# Patient Record
Sex: Male | Born: 1940 | Race: White | Hispanic: No | Smoking: Never smoker
Health system: Southern US, Community
[De-identification: ages and names within clinical notes are randomized; demographics above are authoritative.]

## PROBLEM LIST (undated history)

## (undated) DIAGNOSIS — E119 Type 2 diabetes mellitus without complications: Secondary | ICD-10-CM

## (undated) DIAGNOSIS — N400 Enlarged prostate without lower urinary tract symptoms: Secondary | ICD-10-CM

## (undated) DIAGNOSIS — R351 Nocturia: Secondary | ICD-10-CM

## (undated) DIAGNOSIS — I509 Heart failure, unspecified: Secondary | ICD-10-CM

## (undated) DIAGNOSIS — R1310 Dysphagia, unspecified: Secondary | ICD-10-CM

## (undated) DIAGNOSIS — I10 Essential (primary) hypertension: Secondary | ICD-10-CM

## (undated) DIAGNOSIS — I499 Cardiac arrhythmia, unspecified: Secondary | ICD-10-CM

## (undated) DIAGNOSIS — R339 Retention of urine, unspecified: Secondary | ICD-10-CM

## (undated) DIAGNOSIS — E785 Hyperlipidemia, unspecified: Secondary | ICD-10-CM

## (undated) DIAGNOSIS — D649 Anemia, unspecified: Secondary | ICD-10-CM

## (undated) DIAGNOSIS — K219 Gastro-esophageal reflux disease without esophagitis: Secondary | ICD-10-CM

## (undated) HISTORY — DX: Hyperlipidemia, unspecified: E78.5

## (undated) HISTORY — DX: Benign prostatic hyperplasia without lower urinary tract symptoms: N40.0

## (undated) HISTORY — DX: Anemia, unspecified: D64.9

## (undated) HISTORY — DX: Retention of urine, unspecified: R33.9

## (undated) HISTORY — DX: Nocturia: R35.1

## (undated) HISTORY — DX: Cardiac arrhythmia, unspecified: I49.9

## (undated) HISTORY — DX: Type 2 diabetes mellitus without complications: E11.9

## (undated) HISTORY — DX: Essential (primary) hypertension: I10

## (undated) HISTORY — PX: TRANSURETHRAL RESECTION OF PROSTATE: SHX73

---

## 1962-02-20 HISTORY — PX: OTHER SURGICAL HISTORY: SHX169

## 2004-10-07 ENCOUNTER — Emergency Department: Payer: Self-pay | Admitting: Emergency Medicine

## 2004-10-07 ENCOUNTER — Other Ambulatory Visit: Payer: Self-pay

## 2004-11-04 ENCOUNTER — Other Ambulatory Visit: Payer: Self-pay

## 2004-11-05 ENCOUNTER — Inpatient Hospital Stay: Payer: Self-pay | Admitting: Internal Medicine

## 2005-05-19 ENCOUNTER — Ambulatory Visit: Payer: Self-pay | Admitting: Unknown Physician Specialty

## 2005-05-23 ENCOUNTER — Inpatient Hospital Stay: Payer: Self-pay | Admitting: Internal Medicine

## 2005-05-23 ENCOUNTER — Ambulatory Visit: Payer: Self-pay | Admitting: Internal Medicine

## 2005-05-24 ENCOUNTER — Other Ambulatory Visit: Payer: Self-pay

## 2005-07-03 ENCOUNTER — Ambulatory Visit: Payer: Self-pay | Admitting: Unknown Physician Specialty

## 2006-03-20 ENCOUNTER — Ambulatory Visit: Payer: Self-pay | Admitting: Internal Medicine

## 2007-01-05 ENCOUNTER — Inpatient Hospital Stay: Payer: Self-pay | Admitting: Specialist

## 2008-12-09 ENCOUNTER — Ambulatory Visit: Payer: Self-pay | Admitting: Internal Medicine

## 2008-12-21 ENCOUNTER — Ambulatory Visit: Payer: Self-pay | Admitting: Internal Medicine

## 2009-01-20 ENCOUNTER — Ambulatory Visit: Payer: Self-pay | Admitting: Internal Medicine

## 2009-02-20 ENCOUNTER — Ambulatory Visit: Payer: Self-pay | Admitting: Internal Medicine

## 2011-10-03 LAB — URINALYSIS, COMPLETE
Bacteria: NONE SEEN
Bilirubin,UR: NEGATIVE
Glucose,UR: NEGATIVE mg/dL (ref 0–75)
Ketone: NEGATIVE
Leukocyte Esterase: NEGATIVE
Nitrite: NEGATIVE
Ph: 8 (ref 4.5–8.0)
Protein: NEGATIVE
RBC,UR: 1 /HPF (ref 0–5)
Specific Gravity: 1.003 (ref 1.003–1.030)
Squamous Epithelial: <1
Transitional Epi: <1
WBC UR: 1 /HPF (ref 0–5)

## 2011-10-03 LAB — COMPREHENSIVE METABOLIC PANEL
Anion Gap: 8 (ref 7–16)
BUN: 12 mg/dL (ref 7–18)
Creatinine: 0.96 mg/dL (ref 0.60–1.30)
EGFR (African American): >60
EGFR (Non-African Amer.): >60
Glucose: 108 mg/dL — ABNORMAL HIGH (ref 65–99)
Osmolality: 285 (ref 275–301)
SGPT (ALT): 16 U/L (ref 12–78)
Total Protein: 6.5 g/dL (ref 6.4–8.2)

## 2011-10-03 LAB — CK TOTAL AND CKMB (NOT AT ARMC)
CK, Total: 38 U/L (ref 35–232)
CK-MB: 1.8 ng/mL (ref 0.5–3.6)

## 2011-10-03 LAB — PROTIME-INR
INR: 0.9
Prothrombin Time: 12.9 secs (ref 11.5–14.7)

## 2011-10-03 LAB — CBC
HCT: 39 % — ABNORMAL LOW (ref 40.0–52.0)
MCHC: 34.4 g/dL (ref 32.0–36.0)
MCV: 90 fL (ref 80–100)
RBC: 4.32 10*6/uL — ABNORMAL LOW (ref 4.40–5.90)
RDW: 13.6 % (ref 11.5–14.5)
WBC: 5.7 10*3/uL (ref 3.8–10.6)

## 2011-10-03 LAB — TROPONIN I
Troponin-I: <0.02 ng/mL
Troponin-I: <0.02 ng/mL

## 2011-10-03 LAB — LIPASE, BLOOD: Lipase: 93 U/L (ref 73–393)

## 2011-10-04 ENCOUNTER — Observation Stay: Payer: Self-pay | Admitting: Internal Medicine

## 2011-10-04 LAB — HEPATIC FUNCTION PANEL A (ARMC)
Alkaline Phosphatase: 138 U/L — ABNORMAL HIGH (ref 50–136)
Bilirubin, Direct: 0.3 mg/dL — ABNORMAL HIGH (ref 0.00–0.20)
Bilirubin, Direct: 0.2 mg/dL (ref 0.00–0.20)
Bilirubin,Total: 1.1 mg/dL — ABNORMAL HIGH (ref 0.2–1.0)
Bilirubin,Total: 1 mg/dL (ref 0.2–1.0)
SGPT (ALT): 12 U/L (ref 12–78)
SGPT (ALT): 16 U/L (ref 12–78)
Total Protein: 5.6 g/dL — ABNORMAL LOW (ref 6.4–8.2)

## 2011-10-04 LAB — CBC WITH DIFFERENTIAL/PLATELET
Basophil #: 0 10*3/uL (ref 0.0–0.1)
Basophil %: 0.5 %
Eosinophil #: 0.1 10*3/uL (ref 0.0–0.7)
Lymphocyte #: 1.7 10*3/uL (ref 1.0–3.6)
MCH: 31.9 pg (ref 26.0–34.0)
MCHC: 35.2 g/dL (ref 32.0–36.0)
MCV: 91 fL (ref 80–100)
Monocyte #: 0.5 x10 3/mm (ref 0.2–1.0)
Neutrophil %: 59.6 %
WBC: 5.8 10*3/uL (ref 3.8–10.6)

## 2011-10-05 LAB — CBC WITH DIFFERENTIAL/PLATELET
Basophil #: 0 10*3/uL (ref 0.0–0.1)
Eosinophil #: 0.2 10*3/uL (ref 0.0–0.7)
Eosinophil %: 3.4 %
HCT: 36.4 % — ABNORMAL LOW (ref 40.0–52.0)
HGB: 12.7 g/dL — ABNORMAL LOW (ref 13.0–18.0)
Lymphocyte #: 1.8 10*3/uL (ref 1.0–3.6)
MCH: 32 pg (ref 26.0–34.0)
MCHC: 35 g/dL (ref 32.0–36.0)
MCV: 92 fL (ref 80–100)
Monocyte #: 0.4 x10 3/mm (ref 0.2–1.0)
Monocyte %: 8 %
Neutrophil %: 49.4 %
Platelet: 105 10*3/uL — ABNORMAL LOW (ref 150–440)
RBC: 3.97 10*6/uL — ABNORMAL LOW (ref 4.40–5.90)
RDW: 13.9 % (ref 11.5–14.5)

## 2011-10-05 LAB — PSA: PSA: 0.2 ng/mL (ref 0.0–4.0)

## 2011-10-09 LAB — CULTURE, BLOOD (SINGLE)

## 2011-10-10 ENCOUNTER — Emergency Department: Payer: Self-pay | Admitting: Emergency Medicine

## 2011-10-10 LAB — URINALYSIS, COMPLETE
Glucose,UR: NEGATIVE mg/dL (ref 0–75)
Nitrite: NEGATIVE
Protein: 30
RBC,UR: 10 /HPF (ref 0–5)
WBC UR: 194 /HPF (ref 0–5)

## 2011-10-10 LAB — BASIC METABOLIC PANEL
Anion Gap: 5 — ABNORMAL LOW (ref 7–16)
Calcium, Total: 8.4 mg/dL — ABNORMAL LOW (ref 8.5–10.1)
Chloride: 106 mmol/L (ref 98–107)
Co2: 27 mmol/L (ref 21–32)
EGFR (African American): >60
Osmolality: 277 (ref 275–301)
Sodium: 138 mmol/L (ref 136–145)

## 2011-10-12 LAB — URINE CULTURE

## 2011-12-09 ENCOUNTER — Emergency Department: Payer: Self-pay | Admitting: Emergency Medicine

## 2011-12-09 LAB — URINALYSIS, COMPLETE
Bacteria: NONE SEEN
Bilirubin,UR: NEGATIVE
Glucose,UR: NEGATIVE mg/dL (ref 0–75)
Leukocyte Esterase: NEGATIVE
Ph: 7 (ref 4.5–8.0)
RBC,UR: NONE SEEN /HPF (ref 0–5)
Squamous Epithelial: NONE SEEN
WBC UR: 2 /HPF (ref 0–5)

## 2011-12-09 LAB — COMPREHENSIVE METABOLIC PANEL
Albumin: 3.8 g/dL (ref 3.4–5.0)
Alkaline Phosphatase: 147 U/L — ABNORMAL HIGH (ref 50–136)
Anion Gap: 8 (ref 7–16)
Bilirubin,Total: 0.6 mg/dL (ref 0.2–1.0)
Chloride: 107 mmol/L (ref 98–107)
Co2: 26 mmol/L (ref 21–32)
Creatinine: 1.06 mg/dL (ref 0.60–1.30)
EGFR (African American): >60
EGFR (Non-African Amer.): >60
Glucose: 107 mg/dL — ABNORMAL HIGH (ref 65–99)
Osmolality: 280 (ref 275–301)
SGOT(AST): 20 U/L (ref 15–37)
SGPT (ALT): 15 U/L (ref 12–78)
Sodium: 141 mmol/L (ref 136–145)

## 2011-12-09 LAB — CK TOTAL AND CKMB (NOT AT ARMC)
CK, Total: 25 U/L — ABNORMAL LOW (ref 35–232)
CK-MB: 0.6 ng/mL (ref 0.5–3.6)

## 2011-12-09 LAB — CBC
HGB: 13.4 g/dL (ref 13.0–18.0)
MCH: 32.4 pg (ref 26.0–34.0)
MCHC: 35.1 g/dL (ref 32.0–36.0)
MCV: 92 fL (ref 80–100)
RBC: 4.12 10*6/uL — ABNORMAL LOW (ref 4.40–5.90)

## 2011-12-09 LAB — TROPONIN I: Troponin-I: <0.02 ng/mL

## 2011-12-09 LAB — LIPASE, BLOOD: Lipase: 132 U/L (ref 73–393)

## 2011-12-10 LAB — TROPONIN I: Troponin-I: <0.02 ng/mL

## 2012-02-21 HISTORY — PX: HIP SURGERY: SHX245

## 2012-03-05 ENCOUNTER — Ambulatory Visit: Payer: Self-pay | Admitting: Unknown Physician Specialty

## 2012-08-16 ENCOUNTER — Ambulatory Visit: Payer: Self-pay | Admitting: Unknown Physician Specialty

## 2013-03-12 ENCOUNTER — Ambulatory Visit: Payer: Self-pay | Admitting: General Practice

## 2013-03-12 LAB — URINALYSIS, COMPLETE
BACTERIA: NONE SEEN
BLOOD: NEGATIVE
GLUCOSE, UR: NEGATIVE mg/dL (ref 0–75)
Hyaline Cast: 26
Ketone: NEGATIVE
Nitrite: NEGATIVE
Ph: 5 (ref 4.5–8.0)
Protein: 30
RBC,UR: 2 /HPF (ref 0–5)
Specific Gravity: 1.028 (ref 1.003–1.030)
Squamous Epithelial: 2
WBC UR: 5 /HPF (ref 0–5)

## 2013-03-12 LAB — BASIC METABOLIC PANEL
Anion Gap: 4 — ABNORMAL LOW (ref 7–16)
BUN: 15 mg/dL (ref 7–18)
CALCIUM: 8.3 mg/dL — AB (ref 8.5–10.1)
CHLORIDE: 110 mmol/L — AB (ref 98–107)
Co2: 25 mmol/L (ref 21–32)
Creatinine: 0.99 mg/dL (ref 0.60–1.30)
EGFR (African American): >60
EGFR (Non-African Amer.): >60
Glucose: 131 mg/dL — ABNORMAL HIGH (ref 65–99)
Osmolality: 280 (ref 275–301)
Potassium: 3.8 mmol/L (ref 3.5–5.1)
SODIUM: 139 mmol/L (ref 136–145)

## 2013-03-12 LAB — APTT: ACTIVATED PTT: 31 s (ref 23.6–35.9)

## 2013-03-12 LAB — CBC
HCT: 37.9 % — AB (ref 40.0–52.0)
HGB: 12.9 g/dL — ABNORMAL LOW (ref 13.0–18.0)
MCH: 31.3 pg (ref 26.0–34.0)
MCHC: 34.1 g/dL (ref 32.0–36.0)
MCV: 92 fL (ref 80–100)
Platelet: 107 10*3/uL — ABNORMAL LOW (ref 150–440)
RBC: 4.11 10*6/uL — AB (ref 4.40–5.90)
RDW: 13.4 % (ref 11.5–14.5)
WBC: 4.6 10*3/uL (ref 3.8–10.6)

## 2013-03-12 LAB — SEDIMENTATION RATE: Erythrocyte Sed Rate: 9 mm/hr (ref 0–20)

## 2013-03-12 LAB — PROTIME-INR
INR: 1
Prothrombin Time: 13.2 secs (ref 11.5–14.7)

## 2013-03-12 LAB — MRSA PCR SCREENING

## 2013-03-13 LAB — URINE CULTURE

## 2013-03-24 ENCOUNTER — Inpatient Hospital Stay: Payer: Self-pay | Admitting: General Practice

## 2013-03-25 LAB — BASIC METABOLIC PANEL
Anion Gap: 4 — ABNORMAL LOW (ref 7–16)
BUN: 18 mg/dL (ref 7–18)
Calcium, Total: 7.5 mg/dL — ABNORMAL LOW (ref 8.5–10.1)
Chloride: 110 mmol/L — ABNORMAL HIGH (ref 98–107)
Co2: 25 mmol/L (ref 21–32)
Creatinine: 1.11 mg/dL (ref 0.60–1.30)
EGFR (African American): >60
EGFR (Non-African Amer.): >60
Glucose: 125 mg/dL — ABNORMAL HIGH (ref 65–99)
Osmolality: 281 (ref 275–301)
Potassium: 4.1 mmol/L (ref 3.5–5.1)
Sodium: 139 mmol/L (ref 136–145)

## 2013-03-25 LAB — HEMOGLOBIN: HGB: 8.2 g/dL — ABNORMAL LOW (ref 13.0–18.0)

## 2013-03-25 LAB — PLATELET COUNT: Platelet: 120 10*3/uL — ABNORMAL LOW (ref 150–440)

## 2013-03-26 LAB — BASIC METABOLIC PANEL
ANION GAP: 15 (ref 7–16)
BUN: 17 mg/dL (ref 7–18)
CALCIUM: 7.5 mg/dL — AB (ref 8.5–10.1)
CO2: 13 mmol/L — AB (ref 21–32)
Chloride: 109 mmol/L — ABNORMAL HIGH (ref 98–107)
Creatinine: 1.23 mg/dL (ref 0.60–1.30)
EGFR (African American): >60
GFR CALC NON AF AMER: 58 — AB
Glucose: 114 mg/dL — ABNORMAL HIGH (ref 65–99)
Osmolality: 276 (ref 275–301)
POTASSIUM: 3.8 mmol/L (ref 3.5–5.1)
Sodium: 137 mmol/L (ref 136–145)

## 2013-03-26 LAB — HEMOGLOBIN
HGB: 7 g/dL — ABNORMAL LOW (ref 13.0–18.0)
HGB: 8.4 g/dL — ABNORMAL LOW (ref 13.0–18.0)

## 2013-03-26 LAB — PLATELET COUNT: PLATELETS: 75 10*3/uL — AB (ref 150–440)

## 2013-03-27 LAB — HEMOGLOBIN: HGB: 8.7 g/dL — AB (ref 13.0–18.0)

## 2013-03-27 LAB — PATHOLOGY REPORT

## 2013-03-28 ENCOUNTER — Encounter: Payer: Self-pay | Admitting: Internal Medicine

## 2013-03-28 LAB — BASIC METABOLIC PANEL
ANION GAP: 3 — AB (ref 7–16)
BUN: 13 mg/dL (ref 7–18)
CHLORIDE: 107 mmol/L (ref 98–107)
Calcium, Total: 7.4 mg/dL — ABNORMAL LOW (ref 8.5–10.1)
Co2: 29 mmol/L (ref 21–32)
Creatinine: 1.09 mg/dL (ref 0.60–1.30)
EGFR (African American): >60
Glucose: 115 mg/dL — ABNORMAL HIGH (ref 65–99)
Osmolality: 279 (ref 275–301)
POTASSIUM: 4.2 mmol/L (ref 3.5–5.1)
Sodium: 139 mmol/L (ref 136–145)

## 2013-03-28 LAB — URINALYSIS, COMPLETE
Bilirubin,UR: NEGATIVE
Glucose,UR: NEGATIVE mg/dL (ref 0–75)
KETONE: NEGATIVE
Leukocyte Esterase: NEGATIVE
NITRITE: NEGATIVE
Ph: 8 (ref 4.5–8.0)
Protein: NEGATIVE
SPECIFIC GRAVITY: 1.01 (ref 1.003–1.030)

## 2013-03-29 LAB — HEMOGLOBIN
HGB: 7.7 g/dL — ABNORMAL LOW (ref 13.0–18.0)
HGB: 8.2 g/dL — AB (ref 13.0–18.0)

## 2013-04-01 LAB — CBC WITH DIFFERENTIAL/PLATELET
BASOS PCT: 0.8 %
Basophil #: 0 10*3/uL (ref 0.0–0.1)
Eosinophil #: 0.2 10*3/uL (ref 0.0–0.7)
Eosinophil %: 3.5 %
HCT: 25.8 % — AB (ref 40.0–52.0)
HGB: 9 g/dL — AB (ref 13.0–18.0)
LYMPHS PCT: 19.8 %
Lymphocyte #: 1 10*3/uL (ref 1.0–3.6)
MCH: 32.3 pg (ref 26.0–34.0)
MCHC: 34.8 g/dL (ref 32.0–36.0)
MCV: 93 fL (ref 80–100)
Monocyte #: 0.4 x10 3/mm (ref 0.2–1.0)
Monocyte %: 8.2 %
Neutrophil #: 3.3 10*3/uL (ref 1.4–6.5)
Neutrophil %: 67.7 %
PLATELETS: 188 10*3/uL (ref 150–440)
RBC: 2.78 10*6/uL — AB (ref 4.40–5.90)
RDW: 13.4 % (ref 11.5–14.5)
WBC: 4.9 10*3/uL (ref 3.8–10.6)

## 2013-04-01 LAB — BASIC METABOLIC PANEL
Anion Gap: 3 — ABNORMAL LOW (ref 7–16)
BUN: 13 mg/dL (ref 7–18)
CO2: 27 mmol/L (ref 21–32)
CREATININE: 1.07 mg/dL (ref 0.60–1.30)
Calcium, Total: 8.4 mg/dL — ABNORMAL LOW (ref 8.5–10.1)
Chloride: 109 mmol/L — ABNORMAL HIGH (ref 98–107)
EGFR (African American): >60
Glucose: 98 mg/dL (ref 65–99)
Osmolality: 278 (ref 275–301)
Potassium: 4.1 mmol/L (ref 3.5–5.1)
SODIUM: 139 mmol/L (ref 136–145)

## 2013-04-01 LAB — MAGNESIUM: Magnesium: 1.9 mg/dL

## 2013-04-01 LAB — TSH: Thyroid Stimulating Horm: 1 u[IU]/mL

## 2013-04-02 LAB — URINALYSIS, COMPLETE
BLOOD: NEGATIVE
Bacteria: NONE SEEN
Bilirubin,UR: NEGATIVE
GLUCOSE, UR: NEGATIVE mg/dL (ref 0–75)
Ketone: NEGATIVE
Leukocyte Esterase: NEGATIVE
Nitrite: NEGATIVE
PH: 6 (ref 4.5–8.0)
PROTEIN: NEGATIVE
RBC,UR: 4 /HPF (ref 0–5)
Specific Gravity: 1.015 (ref 1.003–1.030)
Squamous Epithelial: 1
WBC UR: 2 /HPF (ref 0–5)

## 2013-04-20 ENCOUNTER — Encounter: Payer: Self-pay | Admitting: Internal Medicine

## 2013-11-20 DIAGNOSIS — K219 Gastro-esophageal reflux disease without esophagitis: Secondary | ICD-10-CM | POA: Insufficient documentation

## 2013-11-20 DIAGNOSIS — G629 Polyneuropathy, unspecified: Secondary | ICD-10-CM

## 2013-11-20 DIAGNOSIS — D509 Iron deficiency anemia, unspecified: Secondary | ICD-10-CM | POA: Insufficient documentation

## 2013-11-20 DIAGNOSIS — F958 Other tic disorders: Secondary | ICD-10-CM | POA: Insufficient documentation

## 2013-11-20 DIAGNOSIS — E119 Type 2 diabetes mellitus without complications: Secondary | ICD-10-CM | POA: Insufficient documentation

## 2013-11-20 DIAGNOSIS — G608 Other hereditary and idiopathic neuropathies: Secondary | ICD-10-CM | POA: Insufficient documentation

## 2013-11-20 DIAGNOSIS — N400 Enlarged prostate without lower urinary tract symptoms: Secondary | ICD-10-CM | POA: Insufficient documentation

## 2013-11-20 DIAGNOSIS — R809 Proteinuria, unspecified: Secondary | ICD-10-CM | POA: Insufficient documentation

## 2013-12-03 DIAGNOSIS — I493 Ventricular premature depolarization: Secondary | ICD-10-CM | POA: Insufficient documentation

## 2013-12-03 DIAGNOSIS — I38 Endocarditis, valve unspecified: Secondary | ICD-10-CM | POA: Insufficient documentation

## 2014-06-08 DIAGNOSIS — E782 Mixed hyperlipidemia: Secondary | ICD-10-CM | POA: Insufficient documentation

## 2014-06-08 DIAGNOSIS — I10 Essential (primary) hypertension: Secondary | ICD-10-CM | POA: Insufficient documentation

## 2014-06-09 NOTE — Discharge Summary (Signed)
PATIENT NAME:  Jeremy Herrera, Jeremy Herrera MR#:  [id number] DATE OF BIRTH:  1941/02/20  DATE OF ADMISSION:  10/04/2011 DATE OF DISCHARGE:  10/06/2011   PRIMARY CARE PHYSICIAN:  Dr. Mickey Farber.   DISCHARGE/TRANSFER: Skilled nursing facility.   DISCHARGE DIAGNOSES:  1. Acute gastroenteritis.  2. Dehydration. 3. Presyncope. 4. General debility.  5. Mild thrombocytopenia.  6. Lactic acidosis.   IMAGING STUDIES: 2-D echocardiogram which showed ejection fraction 55%. No wall motion abnormalities. No significant valvular disorders. Chest x-ray showed no acute cardiopulmonary disease. KUB x-ray showed some mild ileus with no air fluid levels. Ultrasound of the carotids showed smooth plaque with no hemodynamically significant stenosis. CT scan of the head showed no acute intracranial process. CT scan of the abdomen and pelvis with contrast showed few small bowel air-fluid levels and mildly distended loops of small bowel representing ileus versus gastroenteritis.   ADMITTING HISTORY AND PHYSICAL: Please see detailed history and physical dictated on 10/03/2011. In brief, 74 year old Caucasian male patient with history of gastroesophageal reflux disease, recurrent chest pains presented to the ER complaining of nausea and recurrent episodes of vomiting and diarrhea. The patient was also extremely weak and wobbly and unable to walk safely. The patient was admitted for dehydration with acute gastroenteritis and presyncopal episode.   HOSPITAL COURSE:  1. Presyncope. This was thought likely secondary to his severe dehydration. The patient had a 2-D echocardiogram along with carotid Doppler's which were normal. CT scan of the head showed no acute abnormalities. The patient's telemetry did not show any acute arrhythmias.  2. Acute gastroenteritis. This resolved on its own without any treatment. On the day of discharge, the patient does not have any nausea, vomiting, abdominal pain, or diarrhea. He is doing well,  tolerating his oral food.  3. General debility. The patient was extremely weak, worked with physical therapy who recommended the patient have further physical therapy at a skilled nursing facility and the patient is being transferred there. On the day of discharge the patient's vitals show a temperature of 97.5, pulse of 85 blood pressure 143/82, saturating 97% on room air. He has a benign abdominal examination.  4. Urinary retention. The patient did have urinary retention during the hospital stay for which a Foley catheter has been placed. With his history of TURP and prostate disorders in the past, the patient has been set up for an outpatient follow-up with urology. He will be discharged to a skilled nursing facility with the Foley catheter to follow up with urology. The patient has been started on Flomax and Proscar.   DISCHARGE MEDICATIONS:  1. Nexium 40 mg oral once a day.  2. Proscar 5 mg oral once a day.  3. Tylenol Extra Strength 500 mg 1 tablet every four hours as needed for pain or fever.  4. Flomax 0.4 mg oral once a day.  5. Aspirin enteric-coated 81 mg oral once a day.   DISCHARGE INSTRUCTIONS:  1. Further management as per the physician at skilled nursing facility.  2. The patient will work with physical therapy.  3. His activity will be as tolerated with assistance at all times to prevent any falls.  4. The patient will be on a regular diet.  5. He has been set up for an outpatient urology follow-up.  6. This plan was discussed with the patient, his wife and daughter at bedside who verbalized understanding and are okay with the plan.   TIME SPENT TODAY ON THIS DISCHARGE DICTATION ALONG WITH COORDINATING CARE AND COUNSELING  OF THE PATIENT AND FAMILY: 35 minutes.  ____________________________ Molinda BailiffSrikar R. Armas Mcbee, MD srs:ap D: 10/06/2011 14:12:32 ET             T: 10/06/2011 14:34:58 ET                JOB#: [id number] cc: Neomia Dearavid N. Harrington Challengerhies, MD Lamar BlinksBruce J. Kowalski, MD Orie FishermanSRIKAR R Terisha Losasso  MD ELECTRONICALLY SIGNED 10/07/2011 12:30

## 2014-06-09 NOTE — H&P (Signed)
PATIENT NAME:  Jeremy Herrera, Jeremy Herrera DATE OF BIRTH:  [birth year]  DATE OF ADMISSION:  10/03/2011  PRIMARY CARE PHYSICIAN: Dr. Mickey Farberavid Thies  CARDIOLOGIST: Dr. Gwen PoundsKowalski    CHIEF COMPLAINT: Nausea, vomiting, diarrhea, generalized weakness, near syncope.   HISTORY OF PRESENT ILLNESS: Jeremy Herrera is a 74 year old pleasant Caucasian male with past medical history of systemic hypertension, gastroesophageal reflux disease and history of recurrent chest pain followed up by Dr. Gwen PoundsKowalski. He was seen last time in June. The patient reports that over the last 3 to 4 days he developed nausea, recurrent episodes of vomiting and diarrhea. He also describes crampy abdominal pain at the mid abdomen slightly in the lower area between the suprapubic area and umbilical area. The pain is on and off. The severity was severe in the beginning but it eased off, now it is minimal. There is no associated fever or chills. No melena or hematochezia. No hematemesis or coffee ground emesis. The patient was treated in the Emergency Department with intravenous fluids and the patient was about to be discharged home. When he stood up and walked around he was pale looking and having generalized weakness and was wobbly and unable to walk with safety. He also reported some vague chest pain on the left side of the chest which was mild, about 1 to 2 on a scale of 10. This was brief and given his general condition it was felt wise to admit the patient for observation to continue IV hydration and to follow up on his cardiac enzymes. His initial work-up included CAT scan of the abdomen which was unremarkable except for some air-fluid levels.    REVIEW OF SYSTEMS: CONSTITUTIONAL: Denies having any fever. No chills. No night sweats but he has generalized fatigue. EYES: No blurring of vision. No double vision. ENT: No hearing impairment. No sore throat. No dysphagia. CARDIOVASCULAR: Reports brief chest pain earlier here at the Emergency  Department with history of recurrent chest pains, usually once a week or every two weeks. No shortness of breath. No edema. RESPIRATORY: No shortness of breath. No cough. No hemoptysis. GASTROINTESTINAL: Reports abdominal pain as above and vomiting and diarrhea. GENITOURINARY: No dysuria. No frequency of urination. MUSCULOSKELETAL: No joint pain or swelling. No muscular pain or swelling. INTEGUMENTARY: No skin rash. No ulcers. NEUROLOGY: No focal weakness. No seizure activity. No headache. PSYCHIATRY: No anxiety. No depression. ENDOCRINE: No polyuria or polydipsia. No heat or cold intolerance.   PAST MEDICAL HISTORY:  1. Systemic hypertension.  2. Gastroesophageal reflux disease. 3. Prior history of syncope. 4. History of recurrent chest pains, followed up by Dr. Gwen PoundsKowalski. His records of chest pain are dated back to 2008. At that time he had stress test that was negative.   PAST SURGICAL HISTORY: Right hip fracture status post fall in 2008 that was operated.   SOCIAL HABITS: Nonsmoker. No history of alcohol or drug abuse.   SOCIAL HISTORY: He is married, living with his wife. 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   ADMISSION MEDICATIONS:  1. Nexium 40 mg a day. 2. He is on cholesterol medicine, likely simvastatin.   ALLERGIES: No known drug allergies.   FAMILY HISTORY: Patient reports that his father died at age of 75 from a heart attack. His mother died at the age of 75 from complications of brain abscess.   PHYSICAL EXAMINATION:  VITAL SIGNS: Blood pressure 102/56, respiratory rate 20, pulse 92, temperature 98.2, oxygen saturation 98%.  GENERAL APPEARANCE: Elderly male laying in bed. He looks sleepy and tired. Thin looking.   HEAD AND NECK: No pallor. No icterus. No cyanosis.   ENT: Hearing was normal. Nasal mucosa, lips, tongue were normal.   EYES: Normal eyelids and conjunctivae. Pupils about 6 mm, equal and reactive to light.   NECK:  Supple. Trachea at midline. No thyromegaly. No cervical lymphadenopathy. No masses.   HEART: Normal S1, S2. No S3, S4. No murmur. No gallop. No carotid bruits.   RESPIRATORY: Normal breathing pattern without use of accessory muscles. No rales. No wheezing.   ABDOMEN: Soft without tenderness. No hepatosplenomegaly. No masses. No hernias. Minimal discomfort at the lower abdomen upon deep palpation. No rebound. The pain is nonlocalized. No evidence of hernia.   SKIN: No ulcers. No subcutaneous nodules.   MUSCULOSKELETAL: No joint swelling. No clubbing.   NEUROLOGIC: Cranial nerves II through XII are intact. No focal motor deficit.   PSYCHIATRY: Patient is alert and oriented x3. Mood and affect were normal.   LABORATORY, DIAGNOSTIC, AND RADIOLOGICAL DATA: Serum glucose 108, BUN 12, creatinine 0.96, sodium 143, potassium 3.8, lipase 93. Liver function tests were normal with a slight elevation of alkaline phosphatase at 147. CPK 38. Troponin less than 0.02 in two samples. CBC showed white count 5000, hemoglobin 13, hematocrit 39, platelet count 122. Prothrombin time 12. INR 0.9. His ABG venous sample showed pH 7.46, pCO2 32, lactic acid 1.8. After IV hydration his lactic acid was down to normal, 1. Urinalysis showed negative urinalysis. Prothrombin time 12.9 with INR 0.9. CAT scan of the abdomen showed few small bowel air-fluid levels and a few mildly distended loops of small bowel without obstruction. Patient also had CAT scan of the head done in the Emergency Department showed no acute intracranial process.   ASSESSMENT:  1. Gastroenteritis.  2. Dehydration.  3. Fatigue. 4. Brief chest pain with prior history of recurrent chest pains followed up by his cardiologist, Dr. Gwen Pounds.   OTHER MEDICAL PROBLEMS:  1. History of hypertension. 2. Gastroesophageal reflux disease.   PLAN: IV hydration with normal saline. Zofran p.r.n. for nausea or vomiting. Follow up on cardiac enzymes. So far he has  two normal troponins. I will check another one in eight hours. I would like to mention that his EKG was unremarkable showing normal sinus rhythm at rate of 77 per minute, premature atrial complex, otherwise unremarkable EKG. I will continue Nexium. I will hold his simvastatin until his digestive system settles down. Hopefully he will feel better by the morning. I would like to mention that his orthostatic blood pressure was checked in supine and sitting position, there was no significant change. Would like to add that the patient reports that he has a LIVING WILL. He has a copy at home. Patient right now is FULL CODE.   TIME SPENT EVALUATING THIS PATIENT: Took more than 55 minutes.   ____________________________ Carney Corners. Rudene Re, MD amd:cms D: 10/03/2011 23:43:06 ET T: 10/04/2011 07:33:58 ET JOB#: [id number]  cc: Carney Corners. Rudene Re, MD, <Dictator> Neomia Dear. Harrington Challenger, MD Zollie Scale MD ELECTRONICALLY SIGNED 10/04/2011 22:23

## 2014-06-13 NOTE — Consult Note (Signed)
PATIENT NAME:  Jeremy Herrera, Jeremy Herrera MR#:  [id number] DATE OF BIRTH:  1940-11-22  DATE OF CONSULTATION:  03/28/2013  REFERRING PHYSICIAN:  Illene Labrador. Angie Fava., MD CONSULTING PHYSICIAN:  Rolly Pancake. Cherlynn Kaiser, MD  PRIMARY CARE PHYSICIAN: Neomia Dear. Harrington Challenger, MD  CHIEF COMPLAINT AND REASON FOR CONSULTATION: Syncope and hypotension.   HISTORY OF PRESENT ILLNESS: This is a 75 year old male who has been in the hospital status post right hip arthroplasty revision, who was going to get discharged today to a skilled nursing facility. The patient was on the bedside commode when he had a syncopal event and became hypotensive. The patient's blood pressure shortly after his syncopal episode was in the 60s. He was lethargic and not arousable at that time. He was placed in a Trendelenburg position and given IV fluid boluses, and his blood pressure has improved since then. His mental status has also started to improve. The patient denied any prodromal symptoms prior to his syncopal episode like any palpitations, chest pain, nausea, vomiting, abdominal pain. The patient does say that he felt a little bit dizzy and lightheaded and warm before it happened.   REVIEW OF SYSTEMS:  CONSTITUTIONAL: No documented fever. No weight gain, no weight loss.  EYES: No blurry or double vision.  ENT: No tinnitus. No postnasal drip. No redness of the oropharynx.  RESPIRATORY: No cough, no wheeze, no hemoptysis, no dyspnea.  CARDIOVASCULAR: No chest pain. No orthopnea. No palpitations. Positive syncope.  GASTROINTESTINAL: No nausea, no vomiting, no diarrhea. No abdominal pain. No melena or hematochezia.  GENITOURINARY: No dysuria, no hematuria.  ENDOCRINE: No polyuria or nocturia, heat or cold intolerance.  HEMATOLOGIC: No anemia, no bruising, no bleeding.  INTEGUMENTARY: No rashes. No lesions.  MUSCULOSKELETAL: No arthritis. No swelling. No gout.  NEUROLOGIC: No numbness or tingling. No ataxia. No seizure-type activity.  PSYCHIATRIC: No  anxiety, no insomnia, no ADD.   PAST MEDICAL HISTORY: Consistent with BPH, GERD, glaucoma.   ALLERGIES: No known drug allergies.   SOCIAL HISTORY: No smoking. No alcohol abuse. No illicit drug abuse. Lives at home with his wife.   FAMILY HISTORY: Both mother and father are deceased. Mother died from complications of heart disease. Father died from a possible brain abscess.   CURRENT MEDICATIONS: As follows:  1. Tylenol 500 mg every 4 hours as needed.  2. Dorzolamide/timolol eyedrops b.i.d. 3. Lovenox 40 mg subcutaneous daily.  4. Iron sulfate 325 mg daily. 5. Lumigan 0.01% ophthalmic solution 1 drop to affected right eye in the evening.  6. Nexium 40 mg daily.  7. Oxycodone 5 mg q.4 hours as needed. 8. Flomax 0.4 mg daily.  9. Tramadol 50 mg q.4 hours as needed.  10. Vitamin B12 500 mcg daily.   PHYSICAL EXAMINATION: Presently, is as follows:  VITAL SIGNS: Temperature 99.3, pulse 95, respirations 18, blood pressure 112/62, saturation is 97% on room air.  GENERAL: The patient is a lethargic-appearing male in a Trendelenburg position but in no apparent distress.  HEAD, EYES, EARS, NOSE AND THROAT: Atraumatic, normocephalic. Extraocular muscles are intact. Pupils equal and reactive to light. Sclerae are anicteric. No conjunctival injection. No pharyngeal erythema.  NECK: Supple. There is no jugular venous distention, no bruits, no lymphadenopathy, no thyromegaly.  HEART: Regular rate and rhythm. No murmurs. No rubs. No clicks.  LUNGS: Clear to auscultation bilaterally. No rales or rhonchi. No wheezes.  ABDOMEN: Soft, flat, nontender, nondistended. Has good bowel sounds. No hepatosplenomegaly appreciated.  EXTREMITIES: No evidence of any cyanosis, clubbing or peripheral edema.  Has +2 pedal and radial pulses bilaterally.  NEUROLOGICAL: The patient is alert, awake and oriented x3 with no focal motor or sensory deficits appreciated bilaterally.  SKIN: Moist and warm with no rashes.   LYMPHATIC: There is no cervical or axillary lymphadenopathy.   LABORATORY DATA: There is no current pertinent laboratory exam.   ASSESSMENT AND PLAN: This is a 74 year old male with a history of benign prostatic hypertrophy, gastroesophageal reflux disease and glaucoma, who came into the hospital for a right hip arthroplasty revision and was going to get discharged today to a skilled nursing facility but had a syncopal event while on the bedside commode and also noted to be hypotensive.   1. Syncope. This is likely vasovagal syncope as the patient was having a large BM when this happened. Based on history and exam, there is no evidence of any neurocardiogenic syncope, although I will go ahead and observe him overnight on telemetry, continue to give him aggressive IV fluids and recheck his hemodynamics and orthostatic vital signs later today.  2. Hypotension. I suspect this is secondary to the vasovagal syncope. It should improve with IV fluids, and it already is. I will check orthostatic vital signs later. There is no evidence of any cardiogenic or septic shock presently.  3. Benign prostatic hypertrophy. Continue Flomax. 4. Gastroesophageal reflux disease. Continue Protonix.  5. Glaucoma. Continue Lumigan eyedrops. 6. Right hip arthroplasty revision, postoperative day #4. Continue care as per orthopedics. Continue physical therapy.  7. The patient likely can be discharged to skilled nursing facility tomorrow if he has no further events of syncope. At this point, I will go ahead and hold all his laxatives.   Thank you so much for the consultation. Will follow along with you.    TIME SPENT: 50 minutes.  ____________________________ Rolly PancakeVivek J. Cherlynn KaiserSainani, MD vjs:lb D: 03/28/2013 11:15:37 ET T: 03/28/2013 11:32:58 ET JOB#: [id number]  cc: Rolly PancakeVivek J. Cherlynn KaiserSainani, MD, <Dictator> Houston SirenVIVEK J Thurmon Mizell MD ELECTRONICALLY SIGNED 04/05/2013 17:55

## 2014-06-13 NOTE — Consult Note (Signed)
Brief Consult Note: Diagnosis: 1. Syncope 2. Hypotension 3. Revision of right Hip Arthroplasty.  4. BPH 5. GERD.   Patient was seen by consultant.   Consult note dictated.   Orders entered.   Comments: 74 yo male w/ hx of BPH, GERD, Glaucoma came into hospital for a right hip arthroplasty revision and was going to get discharged today to SNF but had a syncopal event while on bedside commode. also noted to be hypotensive.   1. Syncope - like vasovagal as pt. was having a BM when this happenned.  NO evidence of neurocardiogenic syncope based on history.  Will observe on tele.  Give IV fluids and repeat Orthostatics later today.  2. Hypotension - likely related to vasovagal syncope.  Will cont. IV fluids and follow hemodynamics and it's improving. Check orthostatic vital signs later today.   3. BPH - cont. flomax.   4. GERD - cont. Protonix.   5. Glaucoma - cont. Lumigan eye drops.   6. Right hip arthoroplasty revision - cont. care as per Ortho.  cont. PT  Thanks for the consult and will follow with you.    Job # [id number].  Electronic Signatures: Houston SirenSainani, Caedmon Louque J (MD)  (Signed 06-Feb-15 11:15)  Authored: Brief Consult Note   Last Updated: 06-Feb-15 11:15 by Houston SirenSainani, Adryana Mogensen J (MD)

## 2014-06-13 NOTE — Discharge Summary (Signed)
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   OPERATION: On 03/24/2013, the patient had removal of dynamic hip screw and sideplate including screws with conversion to a right total hip arthroplasty.   SURGEON: Francesco Sor, M.D.   ASSISTANT: Van Clines, PA.   ESTIMATED BLOOD LOSS: 1500 mL.  REPLACEMENTS: 2800 mL of crystalloid with 1 unit of packed red blood cells.   DRAINS: Two medium drains to Hemovac reservoir.   IMPLANTS USED: DePuy 16.5 mm large stature, 8 inch straight Solution femoral stem, 60 mm outer diameter Pinnacle 100 acetabular component, +4 mm 10 degree Pinnacle Marathon polyethylene liner, 36 mm outer diameter M-Spec femoral head with a +1.5 mm neck length.   The patient was stabilized, brought to the recovery room, and brought down to the orthopedic floor.   HISTORY AND PHYSICAL HISTORY:  The patient is a 74 year old male who presented for an upcoming total hip replacement. The patient had an ORIF in 2008 and has had progressive hip pain and groin pain. The patient has activities of daily living that are with significant difficulty because of the pain.   PHYSICAL EXAMINATION: GENERAL: Well-developed, well-nourished male with an antalgic gait and abductor lurch with ambulation.  CARDIOVASCULAR: Regular rate and rhythm with occasional ectopic beats and 2/6 murmur.  LUNGS: Clear to auscultation.  MUSCULOSKELETAL: In regard to the right hip, the patient has positive pelvic tilt with 1 inch shorter in measurement on the right side. Tenderness is along the greater trochanter and anterior hip joint with extreme range of  motion. The patient has full extension and 90 degrees of flexion with zero internal rotation and 20 degrees external rotation.  NEUROVASCULAR: Essentially intact.   HOSPITAL COURSE: After initial admission on 03/24/2013, the patient was brought to the orthopedic floor. On postop day 1, the patient had a hemoglobin of 8.2 which dropped down to 7 on postop day 2. One unit of transfused blood was given and increased his hemoglobin up to 8.4 and then on postop day 3 was up to 8.7. The patient was doing well, initially bed to chair, and progressed up to ambulating about 170 feet on the day before discharge. The patient was slow with therapy and was in need of skilled nursing and rehab based on his family's situation and his slow progress. The patient was having some pain, but was well managed there. The patient did have a lot of fatigue as he received 1 unit of transfused blood.   CONDITION AT DISCHARGE: Stable.   DISPOSITION: The patient was sent to rehab.   DISCHARGE INSTRUCTIONS: The patient will follow up with [facility] orthopedics on 05/06/2013 with Dr. Ernest Pine. The patient will do weight bear as tolerated on the affected leg. The patient will elevate the leg with 1 to 2 pillows and use knee-high TED hose on both legs, removed at bedtime. The patient will have heels off the bed and use incentive spirometer as well as to encourage cough and deep breathing. The patient's diet is regular. The patient will not get his dressing wet and try to keep it dry and clean, try to leave it on and have it changed on a p.r.n. basis.  The patient will call the clinic or the rehab center will call the clinic if there is any bright red bleeding, calf pain, bowel or bladder difficulty, or fever greater than 101.5. The patient will do physical therapy and occupational therapy as per protocol.   DISCHARGE MEDICATIONS: Nexium 40 mg 1 capsule daily, Lumigan ointment ophthalmic drops 1 in the right eye daily, timolol  ophthalmic 1 drop in both eyes 2 times a day, tamsulosin 0.4 mg 1 capsule daily, ferrous sulfate 325 mg 1 capsule daily, vitamin B12 1 tablet daily, Tylenol 500 one tablet q. 4 hours as needed for pain or fever greater than 100.4, Lovenox 40 mg subcutaneous once a day for 14 days and then discontinue this after 14 days and start on aspirin 81 mg once a day, tramadol 50 mg 1 tablet q. 4 hours as needed for pain, oxycodone 5 mg 1 tablet q. 4 hours as needed for severe pain.  ____________________________ Shela CommonsJ. Dedra Skeensodd Alper Guilmette, [place] jtm:sb D: 03/28/2013 07:11:22 ET T: 03/28/2013 07:37:49 ET JOB#: [id number]  cc: J. Dedra Skeensodd Jacolyn Joaquin, [place]PA, <Dictator> J Lucille Witts [facility] SewickleyMUNDY PA ELECTRONICALLY SIGNED 04/02/2013 6:56

## 2014-06-13 NOTE — Op Note (Signed)
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   PROCEDURES PERFORMED:  1.  Removal of dynamic hip screw, side plate and cortical screws.  2.  Conversion to right total hip arthroplasty.   SURGEON: Illene Labrador. Hooten, M.D.   ASSISTANT:  Van Clines, PA (required to maintain retraction throughout the procedure).   ESTIMATED BLOOD LOSS: 1500 mL   FLUIDS REPLACED: 2800 mL of crystalloid (1 unit of packed red blood cells were initiated and the operating room).   DRAINS: Two medium drains to Hemovac reservoir.   IMPLANTS UTILIZED:  DePuy 16.5 mm large stature, 8 inch straight Solution femoral stem, a 60 mm outer diameter Pinnacle 100 acetabular component, +4 mm 10 degree Pinnacle Marathon polyethylene liner, and a 36 mm outer diameter M-Spec femoral head with a +1.5 mm neck length.   INDICATION FOR SURGERY: The patient is a 74 year old male who underwent open reduction and internal fixation of a right intertrochanteric femur fracture by another physician approximately 7 to 8 years ago. He was having increasing pain and limited range of motion. Radiographs demonstrated severe degenerative changes with prominent overgrowth of bone at the fracture site. After discussion of the risks and benefits of surgical intervention, the patient expressed understanding of the risks and benefits and agreed with plans for surgical intervention.   PROCEDURE IN DETAIL: The patient was brought to the operating room and, after adequate spinal anesthesia was achieved, the patient was placed in a left lateral decubitus position. Axillary  roll was placed, and all bony prominences were well padded. The patient's right hip and leg were cleaned and prepped with alcohol and DuraPrep and draped in the usual sterile fashion. A "timeout" was performed as per usual protocol. A lateral longitudinal incision was made gently curving towards the posterior superior iliac spine. IT band was incised in line with the skin incisions. Fibers of the gluteus maximus were split in line. An incision was made through the fascia of the vastus lateralis and the fibers of the vastus were split in line. The distal portion the plate was visualized, but there was overgrowth over the proximal portion of the plate. Osteotomes were used to carefully debride bone from directly over the plate, affording extension of the osteotomy into the cortical bone. Edges were further debrided using a curette. Next, piriformis tendon was identified, skeletonized, incised at its insertion at the proximal femur and reflected posteriorly. In a similar fashion, short external rotators were incised and reflected posteriorly. A T-type posterior capsulotomy was performed. Posterior dislocation was performed so as to be certain to visualize the femoral neck and head. The hip was then reduced and the 4 cortical screws, the compression screw and the sideplate were removed without difficulty. Complete consolidation was noted. The site was irrigated and a good hemostasis was noted. The fascia of the vastus lateralis was then repaired using #0 Vicryl. The femoral head was then dislocated. Excessive overgrowth of bone was noted around the remaining portion of neck extending to the region of the lesser trochanter. Bone was carefully debrided using rongeurs. The femoral neck cut was performed using an oscillating saw and additional removal of heterotopic bone was performed on the  anterior aspect of the neck. The anterior capsule was elevated. Gross degenerative changes were noted to the femoral head. Next, the  acetabulum was inspected. Severe degenerative changes were noted. Remnant of the labrum was excised. The acetabulum was then reamed in a sequential fashion up to a 59 mm diameter. Good punctate bleeding bone was encountered. A 60 mm outer diameter Pinnacle 100 acetabular component was positioned and impacted into place. Excellent scratch fit was appreciated. A +4 neutral polyethylene was inserted, and attention was directed to the proximal femur. Pilot hole for reaming of the proximal femoral canal was created using a high-speed burr. Canal finder was inserted, and serial reaming was inserted up to a 16 mm diameter. Given the length of the plate, prior templating had been performed for placement of an 8 inch straight stem. The proximal portion of the femur was prepared using a 16.5 mm aggressive side-biting reamer. Serial broaching was inserted up to a 16.5 mm large stature broach. The calcar region was planed. Marney DoctorBroach was removed and a 16.5 mm large stature 8 inch straight trial was inserted, and a +1.5 mm trial ball was inserted. The hip was felt to be tight with extension. Trial components were removed and the broach was countersunk so as to remove additional bone from the calcar region. The 16.5 mm large stature 8 inch straight trial was reinserted, and reduction was performed with a 36 mm hip ball with a +1.5 mm neck length. Intraoperative x-ray was obtained with good position of the trial stem. Reasonably good stability was appreciated with the +4 neutral polyethylene, and it was felt that improved stability was achieved with a +4 mm 10 degrees liner with the high side positioned at approximately the 8 o'clock position. This allowed for both excellent anterior and posterior stability as well as good approximation of limb lengths. There do not appear to be any excessive of tension on the sciatic nerve at this point. Trial components were removed. The shell was irrigated and suctioned dry. A +4 mm 10 degrees  Pinnacle Marathon polyethylene liner was positioned on the high side at approximately the 8 o'clock position. Next, a 16.5 mm large stature 8 inch straight Solution stem was positioned and impacted into place. Excellent scratch fit was appreciated. Trial reduction was again performed with a 36 mm hip ball with a +1.5 mm neck length. Again, excellent anterior and posterior stability was appreciated with good equalization of limb lengths. Trial hip ball was removed. The Morse taper was cleaned and dried. A 36 mm M-Spec femoral head with a +1.5 mm neck length was placed on the trunnion and impacted into place. The hip was reduced and again placed through a range of motion with excellent stability appreciated.   The wound was irrigated with copious amounts of normal saline with antibiotic solution using pulsatile lavage and then suctioned dry. Good hemostasis was appreciated. The posterior capsulotomy was repaired using #5 Ethibond. The piriformis tendon was reapproximated on the surface of the gluteus medius tendon using #5 Ethibond. The gluteal sling was repaired using #5 Ethibond. Two medium drains were placed in the wound bed and brought out through a separate stab incision to be attached to a Hemovac reservoir. The IT band was repaired using interrupted sutures of #1 Vicryl. The subcutaneous tissue was approximated in layers using first #0 Vicryl followed by 2-0 Vicryl. Skin was closed with skin staples. Sterile dressing was applied.   The patient tolerated the procedure well. He was transported to the recovery room  in stable condition.    ____________________________ Illene Labrador. Angie Fava., MD jph:dp D: 03/25/2013 06:46:31 ET T: 03/25/2013 09:26:31 ET JOB#: [id number]  cc: Illene Labrador. Angie Fava., MD, <Dictator> Illene Labrador Angie Fava MD ELECTRONICALLY SIGNED 04/11/2013 6:41

## 2014-08-20 ENCOUNTER — Encounter: Payer: Self-pay | Admitting: Emergency Medicine

## 2014-08-20 ENCOUNTER — Ambulatory Visit
Admission: EM | Admit: 2014-08-20 | Discharge: 2014-08-20 | Disposition: A | Discharge status: Home / Self Care | Payer: Medicare PPO | Attending: Family Medicine | Admitting: Family Medicine

## 2014-08-20 DIAGNOSIS — L03119 Cellulitis of unspecified part of limb: Secondary | ICD-10-CM | POA: Diagnosis not present

## 2014-08-20 DIAGNOSIS — L97521 Non-pressure chronic ulcer of other part of left foot limited to breakdown of skin: Secondary | ICD-10-CM

## 2014-08-20 DIAGNOSIS — R609 Edema, unspecified: Secondary | ICD-10-CM

## 2014-08-20 HISTORY — DX: Gastro-esophageal reflux disease without esophagitis: K21.9

## 2014-08-20 MED ORDER — POTASSIUM CHLORIDE ER 20 MEQ PO TBCR
20.0000 meq | EXTENDED_RELEASE_TABLET | Freq: Every day | ORAL | Status: DC
Start: 2014-08-20 — End: 2014-09-16

## 2014-08-20 MED ORDER — CEPHALEXIN 500 MG PO CAPS: 500.0000 mg | ORAL_CAPSULE | Freq: Three times a day (TID) | ORAL | Status: DC

## 2014-08-20 MED ORDER — FUROSEMIDE 20 MG PO TABS: 20.0000 mg | ORAL_TABLET | Freq: Every day | ORAL | Status: DC

## 2014-08-20 NOTE — ED Notes (Signed)
Patient c/o leg pain and swelling in both legs for the past 3 days.  Patient denies injury or fall.

## 2014-08-20 NOTE — ED Provider Notes (Signed)
CSN: [id number]     Arrival date & time 08/20/14  0725 History   First MD Initiated Contact with Patient 08/20/14 0801     Chief Complaint  Patient presents with  . Leg Pain  . Leg Swelling   (Consider location/radiation/quality/duration/timing/severity/associated sxs/prior Treatment) HPI Comments: 74 yo male with a 3 days h/o lower leg swelling and now pain to touch on the skin. Denies any injuries, falls, fevers, chills, chest pains or shortness of breath. Denies h/o CHF. Daughter states patient has been up on his feet a lot this past week and also recently eating more salt.   Patient is a 74 y.o. male presenting with leg pain. The history is provided by the patient and a relative (daughter).  Leg Pain   Past Medical History  Diagnosis Date  . GERD (gastroesophageal reflux disease)    Past Surgical History  Procedure Laterality Date  . Hip surgery Right    History reviewed. No pertinent family history. History  Substance Use Topics  . Smoking status: Never Smoker   . Smokeless tobacco: Never Used  . Alcohol Use: No    Review of Systems  Allergies  Review of patient's allergies indicates no known allergies.  Home Medications   Prior to Admission medications   Medication Sig Start Date End Date Taking? Authorizing Provider  aspirin 81 MG tablet Take 81 mg by mouth daily.   Yes Historical Provider, MD  esomeprazole (NEXIUM) 20 MG capsule Take 20 mg by mouth daily at 12 noon.   Yes Historical Provider, MD  ferrous sulfate 325 (65 FE) MG tablet Take 325 mg by mouth daily with breakfast.   Yes Historical Provider, MD  vitamin B-12 (CYANOCOBALAMIN) 1000 MCG tablet Take 1,000 mcg by mouth daily.   Yes Historical Provider, MD  cephALEXin (KEFLEX) 500 MG capsule Take 1 capsule (500 mg total) by mouth 3 (three) times daily. 08/20/14   Payton Mccallum, MD  furosemide (LASIX) 20 MG tablet Take 1 tablet (20 mg total) by mouth daily. 08/20/14   Payton Mccallum, MD  potassium chloride 20  MEQ TBCR Take 20 mEq by mouth daily. 08/20/14   Payton Mccallum, MD   BP 149/67 mmHg  Pulse 92  Temp(Src) 97.4 F (36.3 C) (Oral)  Resp 16  Ht  (1.88 m)  Wt 190 lb (86.183 kg)  BMI 24.38 kg/m2  SpO2 100% Physical Exam  Constitutional: He appears well-developed and well-nourished. No distress.  HENT:  Head: Normocephalic and atraumatic.  Eyes: Pupils are equal, round, and reactive to light.  Neck: Normal range of motion. Neck supple. No tracheal deviation present. No thyromegaly present.  Cardiovascular: Normal rate, regular rhythm and normal heart sounds.   Pulmonary/Chest: Effort normal and breath sounds normal. No stridor. No respiratory distress. He has no wheezes. He has no rales. He exhibits no tenderness.  Musculoskeletal: He exhibits edema.  Bilateral lower extremity 2+ tibial and pedal pitting edema with approx 4x6 skin areas of blanchable erythema, warmth and tenderness to palpation; no drainage; 1cm superficial ulceration to lateral aspect of left 4th toe; toenails long, thickened and onychomycotic  Lymphadenopathy:    He has no cervical adenopathy.  Neurological: He is alert.  Skin: Skin is warm. Rash noted. He is not diaphoretic. There is erythema.  Nursing note and vitals reviewed.   ED Course  Procedures (including critical care time) Labs Review Labs Reviewed - No data to display  Imaging Review No results found.   MDM   1. Dependent edema  2. Cellulitis of lower extremity, unspecified laterality   3. Ulcer of toe of left foot, limited to breakdown of skin   (bilateral)  Discharge Medication List as of 08/20/2014  8:31 AM    START taking these medications   Details  cephALEXin (KEFLEX) 500 MG capsule Take 1 capsule (500 mg total) by mouth 3 (three) times daily., Starting 08/20/2014, Until Discontinued, Normal      Plan: 1.  diagnosis reviewed with patient 2. rx as per orders; risks, benefits, potential side effects reviewed with patient 3.  Recommend supportive treatment with compression stockings and legs elevated 4. F/u with PCP and podiatrist or here prn if symptoms worsen or don't improve    Payton Mccallumrlando Maryela Tapper, MD 08/20/14 [phone]

## 2014-09-16 ENCOUNTER — Other Ambulatory Visit: Payer: Self-pay

## 2014-09-16 ENCOUNTER — Inpatient Hospital Stay: Payer: Medicare PPO

## 2014-09-16 ENCOUNTER — Inpatient Hospital Stay: Payer: Medicare PPO | Attending: Hematology and Oncology | Admitting: Hematology and Oncology

## 2014-09-16 VITALS — BP 128/62 | HR 74 | Temp 96.2°F | Resp 19 | Ht 74.0 in | Wt 206.7 lb

## 2014-09-16 DIAGNOSIS — D696 Thrombocytopenia, unspecified: Secondary | ICD-10-CM

## 2014-09-16 DIAGNOSIS — Z79899 Other long term (current) drug therapy: Secondary | ICD-10-CM

## 2014-09-16 DIAGNOSIS — K219 Gastro-esophageal reflux disease without esophagitis: Secondary | ICD-10-CM | POA: Diagnosis not present

## 2014-09-16 DIAGNOSIS — Z7982 Long term (current) use of aspirin: Secondary | ICD-10-CM

## 2014-09-16 DIAGNOSIS — D649 Anemia, unspecified: Secondary | ICD-10-CM | POA: Diagnosis not present

## 2014-09-16 DIAGNOSIS — E538 Deficiency of other specified B group vitamins: Secondary | ICD-10-CM | POA: Diagnosis not present

## 2014-09-16 DIAGNOSIS — L98499 Non-pressure chronic ulcer of skin of other sites with unspecified severity: Secondary | ICD-10-CM | POA: Insufficient documentation

## 2014-09-16 LAB — CBC WITH DIFFERENTIAL/PLATELET
Basophils Absolute: 0.1 10*3/uL (ref 0–0.1)
Basophils Relative: 1 %
Eosinophils Absolute: 0.1 10*3/uL (ref 0–0.7)
Eosinophils Relative: 3 %
HCT: 36.4 % — ABNORMAL LOW (ref 40.0–52.0)
Hemoglobin: 12.3 g/dL — ABNORMAL LOW (ref 13.0–18.0)
Lymphocytes Relative: 24 %
Lymphs Abs: 1.2 10*3/uL (ref 1.0–3.6)
MCH: 30.8 pg (ref 26.0–34.0)
MCHC: 33.7 g/dL (ref 32.0–36.0)
MCV: 91.5 fL (ref 80.0–100.0)
Monocytes Absolute: 0.4 10*3/uL (ref 0.2–1.0)
Monocytes Relative: 9 %
Neutro Abs: 3 10*3/uL (ref 1.4–6.5)
Neutrophils Relative %: 63 %
Platelets: 124 10*3/uL — ABNORMAL LOW (ref 150–440)
RBC: 3.98 MIL/uL — ABNORMAL LOW (ref 4.40–5.90)
RDW: 13.8 % (ref 11.5–14.5)
WBC: 4.8 10*3/uL (ref 3.8–10.6)

## 2014-09-16 LAB — COMPREHENSIVE METABOLIC PANEL
ALT: 12 U/L — ABNORMAL LOW (ref 17–63)
AST: 16 U/L (ref 15–41)
Albumin: 3.6 g/dL (ref 3.5–5.0)
Alkaline Phosphatase: 108 U/L (ref 38–126)
Anion gap: 8 (ref 5–15)
BUN: 20 mg/dL (ref 6–20)
CO2: 25 mmol/L (ref 22–32)
Calcium: 8.5 mg/dL — ABNORMAL LOW (ref 8.9–10.3)
Chloride: 107 mmol/L (ref 101–111)
Creatinine, Ser: 1.01 mg/dL (ref 0.61–1.24)
GFR calc Af Amer: >60 mL/min (ref >60)
GFR calc non Af Amer: >60 mL/min (ref >60)
Glucose, Bld: 81 mg/dL (ref 65–99)
Potassium: 3.8 mmol/L (ref 3.5–5.1)
Sodium: 140 mmol/L (ref 135–145)
Total Bilirubin: 0.6 mg/dL (ref 0.3–1.2)
Total Protein: 6.2 g/dL — ABNORMAL LOW (ref 6.5–8.1)

## 2014-09-16 LAB — RETICULOCYTES
RBC.: 3.98 MIL/uL — ABNORMAL LOW (ref 4.40–5.90)
Retic Count, Absolute: 55.7 10*3/uL (ref 19.0–183.0)
Retic Ct Pct: 1.4 % (ref 0.4–3.1)

## 2014-09-16 LAB — APTT: aPTT: 32 seconds (ref 24–36)

## 2014-09-16 LAB — VITAMIN B12: Vitamin B-12: 262 pg/mL (ref 180–914)

## 2014-09-16 NOTE — Progress Notes (Signed)
[facility]  Clinic day:  09/16/2014  Chief Complaint: TRAMEL WESTBROOK is a 74 y.o. male with thrombocytopenia who is referred in consultation by Dr. Harrington Challenger.  HPI: The patient has been followed by Dr. Harrington Challenger for the past 20 years.   His seen about every 6 months.   Two years ago he was diagnosed with anemia. He states he took oral iron for 2 years as well as B12.  Three months ago he noted some blood on his left fifth toe. He eventually presented to urgent care with an infection. He was placed on Keflex 3 times a day.  He was also noted to have an ulcer on the bottom of his foot which is healing well.  Patient states he had a colonoscopy about 3-4 years ago by Dr. Mechele Collin. Everything looked good.  He states that he doesn't need one anymore. About 10 years ago he had EGD and colonoscopy. He required esophageal dilatation. He has a history of reflux. He denies any melena or hematochezia although notes dark stools with oral iron.  Patient notes that his diet is good. In 02/2011, he weighed 269 pounds and 200 pounds in 2015. His weight is been stable since.  He states that he has cut down the quantity of food he eats as well as a exercising. He eats meat twice a week. He eats a lot of salad. He denies any herbal products or new medications.  Labs on 08/25/2014 revealed a hematocrit 34.5, hemoglobin 11.5, MCV 93.8, platelets 140,000, WBC 4100 with an ANC 2670.  Labs on 11/20/2013 revealed a hematocrit of 39.9, hemoglobin 14.1, MCV 89.3, platelets 157,000, WBC 5400 with an ANC 3500. Creatinine was 0.9, calcium 8.4, and albumin of 3.4 on 070/06/2014. Ferritin was 69 and 08/25/2014 with a reticulocyte count of 1.51%. Urinalysis revealed trace protein. TSH was 0.849.  Symptomatically, he feels pretty good. His legs are tender. He denies any alcohol use. There is no family history of any blood disorder.  Past Medical History  Diagnosis Date  . GERD (gastroesophageal reflux  disease)     Past Surgical History  Procedure Laterality Date  . Hip surgery Right     No family history on file.  Social History:  reports that he has never smoked. He has never used smokeless tobacco. He reports that he does not drink alcohol or use illicit drugs.  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 The patient is alone today.  Allergies: No Known Allergies  Current Medications: Current Outpatient Prescriptions  Medication Sig Dispense Refill  . aspirin 81 MG tablet Take 81 mg by mouth daily.    . bimatoprost (LUMIGAN) 0.01 % SOLN Apply to eye.    . dorzolamide-timolol (COSOPT) 22.3-6.8 MG/ML ophthalmic solution Apply to eye.    . esomeprazole (NEXIUM) 20 MG capsule Take 20 mg by mouth daily at 12 noon.    . ferrous sulfate 325 (65 FE) MG tablet Take 325 mg by mouth daily with breakfast.    . vitamin B-12 (CYANOCOBALAMIN) 1000 MCG tablet Take 1,000 mcg by mouth daily.     No current facility-administered medications for this visit.    Review of Systems:  GENERAL:  Feels pretty good.  Active.  No fevers or sweats.  Weight stable in past year.Marland Kitchen PERFORMANCE STATUS (ECOG):  0 HEENT:  No visual changes, runny nose, sore throat, mouth sores or tenderness. Lungs: No shortness of breath or cough.  No hemoptysis. Cardiac:  No chest pain, palpitations, orthopnea, or PND. GI:  No nausea, vomiting, diarrhea, constipation, melena or hematochezia. GU:  No urgency, frequency, dysuria, or hematuria. Musculoskeletal:  No back pain.  No joint pain.  No muscle tenderness. Extremities:  No pain or swelling. Skin:  No rashes or skin changes. Neuro:  No headache, numbness or weakness, balance or coordination issues. Endocrine:  No diabetes, thyroid issues, hot flashes or night sweats. Psych:  No mood changes, depression or anxiety. Pain:  No focal pain. Review of systems:  All other systems reviewed and found to be negative.  Physical Exam: Blood pressure 128/62, pulse  74, temperature 96.2 F (35.7 C), temperature source Tympanic, resp. rate 19, height 6\' 2"  (1.88 m), weight 206 lb 10.9 oz (93.75 kg). GENERAL:  Well developed, well nourished, tall man sitting comfortably in the exam room in no acute distress. MENTAL STATUS:  Alert and oriented to person, place and time. HEAD:  Short gray hair.  Male pattern baldness.  Normocephalic, atraumatic, face symmetric, no Cushingoid features. EYES:  Glasses.  Blue eyes. Pupils equal round and reactive to light and accomodation.  No conjunctivitis or scleral icterus. ENT:  Oropharynx clear without lesion.  Tongue normal. Mucous membranes moist.  RESPIRATORY:   Intermittent cough.  Clear to auscultation without rales, wheezes or rhonchi. CARDIOVASCULAR:  Regular rate and rhythm without murmur, rub or gallop. ABDOMEN:  Soft, non-tender, with active bowel sounds, and no hepatosplenomegaly.  No masses. SKIN:  Ecchymosis.  No rashes, ulcers or lesions. EXTREMITIES: 2+ chronic bilateral lower extremity edema.  No skin discoloration or tenderness.  No palpable cords. LYMPH NODES: No palpable cervical, supraclavicular, axillary or inguinal adenopathy  NEUROLOGICAL: Unremarkable. PSYCH:  Appropriate.  Assessment:  ABRAM SAX is a 74 y.o. 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  He denies any new medications or herbal products. He has no known autoimmune disease. He has never required transfusion. He recently had a ulceration of the left fourth toe and cellulitis requiring antibiotics (Keflex).  He has a history of iron deficiency anemia. EGD and colonoscopy 10 years ago were negative. He required esophageal  dilatation. He has a history of reflux. Colonoscopy 3-4 years ago was unremarkable per the patient. His diet is good. He eats meat twice a week. He is on oral iron.  Symptomatically the patient feels good. Exam reveals no adenopathy or hepatosplenomegaly.  Plan: 1. Review differential  diagnosis of anemia and thrombocytopenia. Discuss pseudothrombocytopenia, decreased platelet production, increase destruction or sequestration. 2. Labs today:  CBC with differential  (blue top tube), CMP, B12, folate, ferritin, iron studies, ANA, HIV antibody, hepatitis C antibody, hepatitis B surface antigen and core antibody, SPEP, free light chain assay, and PTT. 3. Collect 24 hour urine for UPEP and free light chains. 4. Return to clinic in 2 weeks for review of workup and discussion regarding direction of therapy.   Rosey Bath, MD  09/16/2014, 10:45 AM

## 2014-09-17 LAB — PROTEIN ELECTROPHORESIS, SERUM
A/G Ratio: 1.3 (ref 0.7–1.7)
Albumin ELP: 3.1 g/dL (ref 2.9–4.4)
Alpha-1-Globulin: 0.2 g/dL (ref 0.0–0.4)
Alpha-2-Globulin: 0.6 g/dL (ref 0.4–1.0)
Beta Globulin: 0.9 g/dL (ref 0.7–1.3)
Gamma Globulin: 0.6 g/dL (ref 0.4–1.8)
Globulin, Total: 2.4 g/dL (ref 2.2–3.9)
Total Protein ELP: 5.5 g/dL — ABNORMAL LOW (ref 6.0–8.5)

## 2014-09-17 LAB — ANA W/REFLEX: Anti Nuclear Antibody(ANA): NEGATIVE

## 2014-09-17 LAB — IRON AND TIBC
Iron: 82 ug/dL (ref 45–182)
Saturation Ratios: 26 % (ref 17.9–39.5)
TIBC: 314 ug/dL (ref 250–450)
UIBC: 232 ug/dL

## 2014-09-17 LAB — HEPATITIS B SURFACE ANTIGEN: Hepatitis B Surface Ag: NEGATIVE

## 2014-09-17 LAB — FOLATE: Folate: 9 ng/mL (ref >5.9)

## 2014-09-17 LAB — KAPPA/LAMBDA LIGHT CHAINS
Kappa free light chain: 26.77 mg/L — ABNORMAL HIGH (ref 3.30–19.40)
Kappa, lambda light chain ratio: 1.52 (ref 0.26–1.65)
Lambda free light chains: 17.59 mg/L (ref 5.71–26.30)

## 2014-09-17 LAB — HEPATITIS C ANTIBODY: HCV Ab: <0.1 s/co ratio (ref 0.0–0.9)

## 2014-09-17 LAB — HEPATITIS B CORE ANTIBODY, TOTAL: Hep B Core Total Ab: NEGATIVE

## 2014-09-17 LAB — FERRITIN: Ferritin: 53 ng/mL (ref 24–336)

## 2014-09-17 LAB — HIV ANTIBODY (ROUTINE TESTING W REFLEX): HIV Screen 4th Generation wRfx: NONREACTIVE

## 2014-09-22 ENCOUNTER — Inpatient Hospital Stay: Payer: Medicare PPO | Attending: Hematology and Oncology

## 2014-09-22 DIAGNOSIS — R351 Nocturia: Secondary | ICD-10-CM | POA: Insufficient documentation

## 2014-09-22 DIAGNOSIS — I10 Essential (primary) hypertension: Secondary | ICD-10-CM | POA: Insufficient documentation

## 2014-09-22 DIAGNOSIS — R609 Edema, unspecified: Secondary | ICD-10-CM | POA: Insufficient documentation

## 2014-09-22 DIAGNOSIS — D696 Thrombocytopenia, unspecified: Secondary | ICD-10-CM

## 2014-09-22 DIAGNOSIS — R5383 Other fatigue: Secondary | ICD-10-CM | POA: Insufficient documentation

## 2014-09-22 DIAGNOSIS — I499 Cardiac arrhythmia, unspecified: Secondary | ICD-10-CM | POA: Insufficient documentation

## 2014-09-22 DIAGNOSIS — E785 Hyperlipidemia, unspecified: Secondary | ICD-10-CM | POA: Insufficient documentation

## 2014-09-22 DIAGNOSIS — R339 Retention of urine, unspecified: Secondary | ICD-10-CM | POA: Insufficient documentation

## 2014-09-22 DIAGNOSIS — L03032 Cellulitis of left toe: Secondary | ICD-10-CM | POA: Insufficient documentation

## 2014-09-22 DIAGNOSIS — Z7982 Long term (current) use of aspirin: Secondary | ICD-10-CM | POA: Insufficient documentation

## 2014-09-22 DIAGNOSIS — Z79899 Other long term (current) drug therapy: Secondary | ICD-10-CM | POA: Insufficient documentation

## 2014-09-22 DIAGNOSIS — K219 Gastro-esophageal reflux disease without esophagitis: Secondary | ICD-10-CM | POA: Insufficient documentation

## 2014-09-22 DIAGNOSIS — N400 Enlarged prostate without lower urinary tract symptoms: Secondary | ICD-10-CM | POA: Insufficient documentation

## 2014-09-22 DIAGNOSIS — D509 Iron deficiency anemia, unspecified: Secondary | ICD-10-CM | POA: Insufficient documentation

## 2014-09-24 LAB — UPEP/TP, 24-HR URINE
Albumin, U: 100 %
Alpha 1, Urine: 0 %
Alpha 2, Urine: 0 %
Beta, Urine: 0 %
Gamma Globulin, Urine: 0 %
Total Protein, Urine-Ur/day: 172.8 mg/24 hr — ABNORMAL HIGH (ref 30.0–150.0)
Total Protein, Urine: 6.4 mg/dL

## 2014-09-30 ENCOUNTER — Telehealth: Payer: Self-pay | Admitting: *Deleted

## 2014-09-30 ENCOUNTER — Inpatient Hospital Stay (HOSPITAL_BASED_OUTPATIENT_CLINIC_OR_DEPARTMENT_OTHER): Payer: Medicare PPO | Admitting: Hematology and Oncology

## 2014-09-30 ENCOUNTER — Encounter: Payer: Self-pay | Admitting: Hematology and Oncology

## 2014-09-30 ENCOUNTER — Ambulatory Visit
Admission: RE | Admit: 2014-09-30 | Discharge: 2014-09-30 | Disposition: A | Discharge status: Home / Self Care | Payer: Medicare PPO | Source: Ambulatory Visit | Attending: Hematology and Oncology | Admitting: Hematology and Oncology

## 2014-09-30 ENCOUNTER — Ambulatory Visit: Payer: Medicare PPO | Admitting: Hematology and Oncology

## 2014-09-30 VITALS — BP 148/89 | HR 87 | Temp 97.8°F | Resp 18 | Ht 74.0 in | Wt 203.7 lb

## 2014-09-30 DIAGNOSIS — N400 Enlarged prostate without lower urinary tract symptoms: Secondary | ICD-10-CM | POA: Diagnosis not present

## 2014-09-30 DIAGNOSIS — R609 Edema, unspecified: Secondary | ICD-10-CM

## 2014-09-30 DIAGNOSIS — D649 Anemia, unspecified: Secondary | ICD-10-CM

## 2014-09-30 DIAGNOSIS — L03032 Cellulitis of left toe: Secondary | ICD-10-CM | POA: Diagnosis not present

## 2014-09-30 DIAGNOSIS — Z79899 Other long term (current) drug therapy: Secondary | ICD-10-CM | POA: Diagnosis not present

## 2014-09-30 DIAGNOSIS — I499 Cardiac arrhythmia, unspecified: Secondary | ICD-10-CM | POA: Diagnosis not present

## 2014-09-30 DIAGNOSIS — M7989 Other specified soft tissue disorders: Secondary | ICD-10-CM | POA: Insufficient documentation

## 2014-09-30 DIAGNOSIS — K219 Gastro-esophageal reflux disease without esophagitis: Secondary | ICD-10-CM | POA: Diagnosis not present

## 2014-09-30 DIAGNOSIS — D696 Thrombocytopenia, unspecified: Secondary | ICD-10-CM

## 2014-09-30 DIAGNOSIS — I10 Essential (primary) hypertension: Secondary | ICD-10-CM | POA: Diagnosis not present

## 2014-09-30 DIAGNOSIS — R339 Retention of urine, unspecified: Secondary | ICD-10-CM

## 2014-09-30 DIAGNOSIS — Z7982 Long term (current) use of aspirin: Secondary | ICD-10-CM | POA: Diagnosis not present

## 2014-09-30 DIAGNOSIS — E785 Hyperlipidemia, unspecified: Secondary | ICD-10-CM

## 2014-09-30 DIAGNOSIS — E538 Deficiency of other specified B group vitamins: Secondary | ICD-10-CM | POA: Insufficient documentation

## 2014-09-30 DIAGNOSIS — R351 Nocturia: Secondary | ICD-10-CM

## 2014-09-30 DIAGNOSIS — R5383 Other fatigue: Secondary | ICD-10-CM | POA: Diagnosis not present

## 2014-09-30 DIAGNOSIS — D509 Iron deficiency anemia, unspecified: Secondary | ICD-10-CM | POA: Diagnosis not present

## 2014-09-30 NOTE — Telephone Encounter (Signed)
Negative for DVT. Called report to Dr Merlene Pulling

## 2014-09-30 NOTE — Progress Notes (Signed)
Patient here today for follow up regarding anemia. States he has had some fatigue. Reports he has had swelling and redness more in the right foot and ankle than the left. States it is sore to touch on the right foot and ankle, but denies pain at this time.

## 2014-10-07 ENCOUNTER — Telehealth: Payer: Self-pay

## 2014-10-07 ENCOUNTER — Inpatient Hospital Stay (HOSPITAL_BASED_OUTPATIENT_CLINIC_OR_DEPARTMENT_OTHER): Payer: Medicare PPO | Admitting: Hematology and Oncology

## 2014-10-07 VITALS — BP 144/69 | HR 73 | Temp 96.9°F | Resp 19 | Ht 74.0 in | Wt 204.7 lb

## 2014-10-07 DIAGNOSIS — I10 Essential (primary) hypertension: Secondary | ICD-10-CM

## 2014-10-07 DIAGNOSIS — K219 Gastro-esophageal reflux disease without esophagitis: Secondary | ICD-10-CM

## 2014-10-07 DIAGNOSIS — D696 Thrombocytopenia, unspecified: Secondary | ICD-10-CM

## 2014-10-07 DIAGNOSIS — L03032 Cellulitis of left toe: Secondary | ICD-10-CM

## 2014-10-07 DIAGNOSIS — N400 Enlarged prostate without lower urinary tract symptoms: Secondary | ICD-10-CM

## 2014-10-07 DIAGNOSIS — D509 Iron deficiency anemia, unspecified: Secondary | ICD-10-CM | POA: Diagnosis not present

## 2014-10-07 DIAGNOSIS — R339 Retention of urine, unspecified: Secondary | ICD-10-CM

## 2014-10-07 DIAGNOSIS — E785 Hyperlipidemia, unspecified: Secondary | ICD-10-CM

## 2014-10-07 DIAGNOSIS — R351 Nocturia: Secondary | ICD-10-CM

## 2014-10-07 DIAGNOSIS — Z7982 Long term (current) use of aspirin: Secondary | ICD-10-CM

## 2014-10-07 DIAGNOSIS — R609 Edema, unspecified: Secondary | ICD-10-CM

## 2014-10-07 DIAGNOSIS — I499 Cardiac arrhythmia, unspecified: Secondary | ICD-10-CM

## 2014-10-07 DIAGNOSIS — Z79899 Other long term (current) drug therapy: Secondary | ICD-10-CM

## 2014-10-07 DIAGNOSIS — R5383 Other fatigue: Secondary | ICD-10-CM

## 2014-10-07 LAB — METHYLMALONIC ACID, SERUM: Methylmalonic Acid, Quantitative: 387 nmol/L — ABNORMAL HIGH (ref 0–378)

## 2014-10-07 NOTE — Telephone Encounter (Signed)
Called and spoke with pt to let him know that he is b-12 deficiant and that we would pre-auth him and would call him to set up times to come in.

## 2014-10-07 NOTE — Progress Notes (Signed)
[facility]  Clinic day:  10/07/2014  Chief Complaint: Jeremy Herrera is a 74 y.o. male with thrombocytopenia who is seen for 1 week assessment.  HPI: The patient was last seen in the medical oncology clinic on 09/30/2014.  At that time, workup was reviewed regarding his thrombocytopenia. He had an low normal B12.  He was noted to have lower increasing lower extremity swelling (right greater than left).  Right lower extremity duplex was ordered.  There was no evidence of DVT.  An MMA was ordered to confirm a suspected B12 deficiency.  During the interim, the patient voices no new concerns. He denies any bruising or bleeding  Past Medical History  Diagnosis Date  . GERD (gastroesophageal reflux disease)   . Anemia   . HTN (hypertension)   . Irregular heart beat   . HLD (hyperlipidemia)   . BPH (benign prostatic hyperplasia)   . Nocturia   . Urinary retention     Past Surgical History  Procedure Laterality Date  . Hip surgery Right   . Bladder patching    . Transurethral resection of prostate      Family History  Problem Relation Age of Onset  . Ulcers Mother     stomach  . GER disease Sister     GERD  . Heart attack Father   . Diabetes Brother   . Depression Brother   . Hiatal hernia Mother     Social History:  reports that he has never smoked. He has never used smokeless tobacco. He reports that he does not drink alcohol or use illicit drugs.   Allergies:  Allergies  Allergen Reactions  . Hydrocodone-Acetaminophen Nausea And Vomiting    Current Medications: Current Outpatient Prescriptions  Medication Sig Dispense Refill  . aspirin 81 MG tablet Take 81 mg by mouth daily.    . bimatoprost (LUMIGAN) 0.01 % SOLN Apply to eye.    . dorzolamide-timolol (COSOPT) 22.3-6.8 MG/ML ophthalmic solution Apply to eye.    . esomeprazole (NEXIUM) 20 MG capsule Take 20 mg by mouth daily at 12 noon.    . ferrous sulfate 325 (65 FE) MG  tablet Take 325 mg by mouth daily with breakfast.    . Naproxen Sodium (ALEVE) 220 MG CAPS Take 1 capsule by mouth as needed.    . vitamin B-12 (CYANOCOBALAMIN) 1000 MCG tablet Take 1,000 mcg by mouth daily.     No current facility-administered medications for this visit.    Review of Systems:  GENERAL:  Feels tied (no change).  No fevers, sweats or weight loss. PERFORMANCE STATUS (ECOG): 0 HEENT:  No visual changes, runny nose, sore throat, mouth sores or tenderness. Lungs: No shortness of breath or cough.  No hemoptysis. Cardiac:  No chest pain, palpitations, orthopnea, or PND. GI:  No nausea, vomiting, diarrhea, constipation, melena or hematochezia. GU:  No urgency, frequency, dysuria, or hematuria. Musculoskeletal:  No back pain.  No joint pain.  No muscle tenderness. Extremities:  Lower extremity swelling. Skin:  No rashes or skin changes. Neuro:  No headache, numbness or weakness, balance or coordination issues. Endocrine:  No diabetes, thyroid issues, hot flashes or night sweats. Psych:  No mood changes, depression or anxiety. Pain:  No focal pain. Review of systems:  All other systems reviewed and found to be negative.  Physical Exam: Blood pressure 144/69, pulse 73, temperature 96.9 F (36.1 C), temperature source Tympanic, resp. rate 19, height 6\' 2"  (1.88 m), weight 204 lb  11.2 oz (92.85 kg). GENERAL:  Well developed, well nourished, tall gentleman sitting comfortably in the exam room in no acute distress. MENTAL STATUS:  Alert and oriented to person, place and time. HEAD:  Short gray hair.  Normocephalic, atraumatic, face symmetric, no Cushingoid features. EYES:  Glasses.  Blue eyes. No conjunctivitis or scleral icterus. EXTREMITIES: Bilateral lower extremity edema.  No skin discoloration or tenderness.  NEUROLOGICAL: Unremarkable. PSYCH:  Appropriate.  No visits with results within 3 Day(s) from this visit. Latest known visit with results is:  Appointment on  09/22/2014  Component Date Value Ref Range Status  . Total Protein, Urine 09/22/2014 6.4  Not Estab. mg/dL Final  . Total Protein, Urine-Ur/day 09/22/2014 172.8* 30.0 - 150.0 mg/24 hr Final  . Albumin, U 09/22/2014 100.0   Final  . Alpha 1, Urine 09/22/2014 0.0   Final  . Alpha 2, Urine 09/22/2014 0.0   Final  . Beta, Urine 09/22/2014 0.0   Final  . Gamma Globulin, Urine 09/22/2014 0.0   Final  . M-spike, % 09/22/2014 Not Observed  Not Observed % Final  . PLEASE NOTE: 09/22/2014 Comment   Final   Comment: (NOTE) Protein electrophoresis scan will follow via computer, mail, or courier delivery. Performed At: [facility] [address] [place], [place] [id number] Mila Homer MD JX:[id number]   . Total Volume 09/22/2014 URINE, RANDOM   Final      Assessment:  Jeremy Herrera is a 74 y.o. 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 He denies any new medications or herbal products. He has no known autoimmune disease. He has never required transfusion. He recently had a ulceration of the left fourth toe and cellulitis requiring antibiotics (Keflex).  Work-up on 09/16/2014 revealed a hematocrit 36.4, hemoglobin 12.3, MCV 91.5, platelets 124,000 white count 4800 with an ANC of 3000. Differential was unremarkable. B12 was 262 (low normal). Normal labs included: folate, CMP, ANA, iron studies , hepatitis C antibody, hepatitis B surface antigen and core antibody, HIV testing, SPEP, free light chains, and PTT. TSH was normal on 08/25/2014.  UPEP revealed no monoclonal protein.  He has a history of iron deficiency anemia. EGD and colonoscopy 10 years ago were negative. He required esophageal dilatation. He has a history of reflux. Colonoscopy 3-4 years ago was unremarkable per the patient. His diet is good. He eats meat twice a week. He is on oral iron.  Symptomatically, he has chronic mild fatigue. Exam reveals bilateral lower extremity edema. Right  lower extremity duplex was negative on 09/30/2014.  He has no adenopathy or hepatosplenomegaly.  Plan: 1. Review work-up and lower extremity duplex. 2. Follow-up MMA and 24 hour urine for free light chains. 3. Discuss tentative diagnosis of B12 deficiency (confirm with elevated MMA). 4. Nurse to call patient with pending lab results. 5. MD to schedule follow-up at a later date depending on test results   Rosey Bath, MD  10/07/2014, 9:25 AM

## 2014-10-07 NOTE — Progress Notes (Signed)
Pt reports some soreness on front of right lower leg and top of foot accompanied by some itching.

## 2014-10-08 ENCOUNTER — Encounter: Payer: Self-pay | Admitting: Urology

## 2014-10-08 ENCOUNTER — Ambulatory Visit (INDEPENDENT_AMBULATORY_CARE_PROVIDER_SITE_OTHER): Payer: Medicare PPO | Admitting: Urology

## 2014-10-08 VITALS — BP 119/79 | HR 94 | Ht 74.0 in | Wt 200.8 lb

## 2014-10-08 DIAGNOSIS — Z87898 Personal history of other specified conditions: Secondary | ICD-10-CM

## 2014-10-08 DIAGNOSIS — Z87448 Personal history of other diseases of urinary system: Secondary | ICD-10-CM

## 2014-10-08 DIAGNOSIS — N401 Enlarged prostate with lower urinary tract symptoms: Secondary | ICD-10-CM | POA: Diagnosis not present

## 2014-10-08 DIAGNOSIS — N138 Other obstructive and reflux uropathy: Secondary | ICD-10-CM | POA: Insufficient documentation

## 2014-10-08 DIAGNOSIS — R351 Nocturia: Secondary | ICD-10-CM | POA: Diagnosis not present

## 2014-10-08 LAB — BLADDER SCAN AMB NON-IMAGING: SCAN RESULT: 42

## 2014-10-08 MED ORDER — DUTASTERIDE 0.5 MG PO CAPS: 0.5000 mg | ORAL_CAPSULE | Freq: Every day | ORAL | Status: DC

## 2014-10-08 NOTE — Progress Notes (Signed)
10/08/2014 10:16 AM   Elenor Quinones October 23, 1940 [id number]  Referring provider: Mickey Farber, MD [address] [place], [place] [address]  Chief Complaint  Patient presents with  . Benign Prostatic Hypertrophy    HPI: Patient is a 74 year old white male with a history of BPH with LUTS who is quite tearful today because he lost his wife this past July.  He presents today for a 3 month follow-up after being initiated on Cialis 5 mg daily.  His IPSS score today is 19, which is moderate lower urinary tract symptomatology. He is mixed with his quality life due to his urinary symptoms. His PVR is 42 mL.  His previous IPSS score was 20/3.  His previous PVR is 86 mL.    His major complaint today is nocturia x 3.  He has had these symptoms for many years.  He denies any dysuria, hematuria or suprapubic pain.   He currently taking Cialis 5 mg daily.  He has found minimal relief from the nocturia with the addition of Cialis to his medications.  His has had TURP with the removal of a bladder diverticulum in the remote past.  In the last couple of years, he had undergone 2 cystoscopic examinations. Both of the cystoscopic examinations did not identify any significant bladder outlet obstruction.     He also denies any recent fevers, chills, nausea or vomiting.  He does not have a family history of PCa.      IPSS      10/08/14 0900       International Prostate Symptom Score   How often have you had the sensation of not emptying your bladder? Less than 1 in 5     How often have you had to urinate less than every two hours? Less than half the time     How often have you found you stopped and started again several times when you urinated? About half the time     How often have you found it difficult to postpone urination? Less than half the time     How often have you had a weak urinary stream? More than half the time     How often have you had to strain to start urination? More than half  the time     How many times did you typically get up at night to urinate? 3 Times     Total IPSS Score 19     Quality of Life due to urinary symptoms   If you were to spend the rest of your life with your urinary condition just the way it is now how would you feel about that? Mixed        Score:  1-7 Mild 8-19 Moderate 20-35 Severe  PMH: Past Medical History  Diagnosis Date  . GERD (gastroesophageal reflux disease)   . Anemia   . HTN (hypertension)   . Irregular heart beat   . HLD (hyperlipidemia)   . BPH (benign prostatic hyperplasia)   . Nocturia   . Urinary retention     Surgical History: Past Surgical History  Procedure Laterality Date  . Hip surgery Right   . Bladder patching    . Transurethral resection of prostate      Home Medications:    Medication List       This list is accurate as of: 10/08/14 10:16 AM.  Always use your most recent med list.  ALEVE 220 MG Caps  Generic drug:  Naproxen Sodium  Take 1 capsule by mouth as needed.     aspirin 81 MG tablet  Take 81 mg by mouth daily.     bimatoprost 0.01 % Soln  Commonly known as:  LUMIGAN  Apply to eye.     dorzolamide-timolol 22.3-6.8 MG/ML ophthalmic solution  Commonly known as:  COSOPT  Apply to eye.     esomeprazole 20 MG capsule  Commonly known as:  NEXIUM  Take 20 mg by mouth daily at 12 noon.     ferrous sulfate 325 (65 FE) MG tablet  Take 325 mg by mouth daily with breakfast.     tadalafil 5 MG tablet  Commonly known as:  CIALIS  Take 5 mg by mouth daily.     vitamin B-12 1000 MCG tablet  Commonly known as:  CYANOCOBALAMIN  Take 1,000 mcg by mouth daily.        Allergies:  Allergies  Allergen Reactions  . Hydrocodone-Acetaminophen Nausea And Vomiting    Family History: Family History  Problem Relation Age of Onset  . Ulcers Mother     stomach  . GER disease Sister     GERD  . Heart attack Father   . Diabetes Brother   . Depression Brother   .  Hiatal hernia Mother     Social History:  reports that he has never smoked. He has never used smokeless tobacco. He reports that he does not drink alcohol or use illicit drugs.  ROS: UROLOGY Frequent Urination?: Yes Hard to postpone urination?: No Burning/pain with urination?: No Get up at night to urinate?: Yes Leakage of urine?: No Urine stream starts and stops?: No Trouble starting stream?: No Do you have to strain to urinate?: Yes Blood in urine?: No Urinary tract infection?: No Sexually transmitted disease?: No Injury to kidneys or bladder?: No Painful intercourse?: No Weak stream?: No Erection problems?: No Penile pain?: No  Gastrointestinal Nausea?: No Vomiting?: No Indigestion/heartburn?: No Diarrhea?: No Constipation?: No  Constitutional Fever: No Night sweats?: No Weight loss?: Yes Fatigue?: No  Skin Skin rash/lesions?: No Itching?: Yes  Eyes Blurred vision?: No Double vision?: No  Ears/Nose/Throat Sore throat?: No Sinus problems?: No  Hematologic/Lymphatic Swollen glands?: No Easy bruising?: Yes  Cardiovascular Leg swelling?: Yes Chest pain?: No  Respiratory Cough?: No Shortness of breath?: No  Endocrine Excessive thirst?: No  Musculoskeletal Back pain?: No Joint pain?: No  Neurological Headaches?: No Dizziness?: No  Psychologic Depression?: No Anxiety?: No  Physical Exam: BP 119/79 mmHg  Pulse 94  Ht  (1.88 m)  Wt 200 lb 12.8 oz (91.082 kg)  BMI 25.77 kg/m2  GU: Patient with uncircumcised phallus. Foreskin not easily retracted  Urethral meatus is patent.  No penile discharge. No penile lesions or rashes. Scrotum without lesions, cysts, rashes and/or edema.  Testicles are located scrotally bilaterally. There are atrophic. No masses are appreciated in the testicles. Left and right epididymis are normal. Rectal: Patient with  normal sphincter tone. Perineum without scarring or rashes. No rectal masses are appreciated.  Prostate is approximately 50 grams, no nodules are appreciated. Seminal vesicles are normal.   Laboratory Data: Lab Results  Component Value Date   WBC 4.8 09/16/2014   HGB 12.3* 09/16/2014   HCT 36.4* 09/16/2014   MCV 91.5 09/16/2014   PLT 124* 09/16/2014    Lab Results  Component Value Date   CREATININE 1.01 09/16/2014    Lab Results  Component Value Date  PSA 0.2 10/04/2011   PSA history:  0.4 ng/mL on 06/21/2012  0.1 ng/mL on 07/02/2013  0.1 ng/mL on 04/10/2014  Pertinent Imaging: Results for orders placed or performed in visit on 10/08/14  Bladder Scan (Post Void Residual) in office  Result Value Ref Range   Scan Result 42     Assessment & Plan:    1. BPH (benign prostatic hyperplasia) with LUTS:  Patient's IPSS score is 19/3.  His PVR 42 mL.  His DRE demonstrates enlargement, no nodules.  I discussed the treatment options for BPH, such as: observation over time with prn avoidance of alcohol/caffeine; medical treatment or TURP.  Patient like to pursue medical treatment.  I add dutasteride 0.5 mg to his Cialis 5 mg daily The side effects of dutasteride are also discussed with the patient, such as: impotence, loss of interest in sex, or trouble having an orgasm; abnormal ejaculation; swelling in his hands and/or feet; swelling and/or tenderness in his breasts.  He will follow up in 3 months for a PVR and an IPSS.    2. Nocturia:   I explained to the patient that nocturia is often multi-factorial and difficult to treat.  Sleeping disorders, heart conditions and peripheral vascular disease, diabetes,  enlarged prostate or urethral stricture causing bladder outlet obstruction and/or certain medications.  I have suggested that the patient avoid caffeine and alcohol in the evening. He may also benefit from fluid restrictions after 6:00 in the evening and voiding just prior to bedtime.    3. History of urinary retention:   Patient has a history of urinary retention which has  resolved with medical therapy for BPH. We will continue to monitor. His PVR is minimal today.  - Bladder Scan (Post Void Residual) in office   No Follow-up on file.  Michiel Cowboy, PA-C  [facility] [address] [place], [place] [address] [phone]

## 2014-10-09 ENCOUNTER — Other Ambulatory Visit: Payer: Self-pay | Admitting: Hematology and Oncology

## 2014-10-09 DIAGNOSIS — E538 Deficiency of other specified B group vitamins: Secondary | ICD-10-CM

## 2014-10-14 ENCOUNTER — Ambulatory Visit: Payer: Medicare PPO

## 2014-10-14 ENCOUNTER — Inpatient Hospital Stay: Payer: Medicare PPO

## 2014-10-14 VITALS — BP 156/86 | HR 83 | Temp 97.4°F | Resp 18

## 2014-10-14 DIAGNOSIS — D696 Thrombocytopenia, unspecified: Secondary | ICD-10-CM | POA: Diagnosis not present

## 2014-10-14 DIAGNOSIS — E538 Deficiency of other specified B group vitamins: Secondary | ICD-10-CM

## 2014-10-14 MED ORDER — CYANOCOBALAMIN 1000 MCG/ML IJ SOLN
1000.0000 ug | Freq: Once | INTRAMUSCULAR | Status: AC
  Administered 2014-10-14: 1000 ug via INTRAMUSCULAR

## 2014-10-17 DIAGNOSIS — Z87898 Personal history of other specified conditions: Secondary | ICD-10-CM | POA: Insufficient documentation

## 2014-10-17 DIAGNOSIS — R351 Nocturia: Secondary | ICD-10-CM | POA: Insufficient documentation

## 2014-10-21 ENCOUNTER — Inpatient Hospital Stay: Payer: Medicare PPO

## 2014-10-21 ENCOUNTER — Ambulatory Visit: Payer: Medicare PPO

## 2014-10-21 VITALS — BP 108/68 | HR 73 | Temp 97.9°F | Resp 18

## 2014-10-21 DIAGNOSIS — D696 Thrombocytopenia, unspecified: Secondary | ICD-10-CM | POA: Diagnosis not present

## 2014-10-21 DIAGNOSIS — E538 Deficiency of other specified B group vitamins: Secondary | ICD-10-CM

## 2014-10-21 MED ORDER — CYANOCOBALAMIN 1000 MCG/ML IJ SOLN
1000.0000 ug | Freq: Once | INTRAMUSCULAR | Status: AC
  Administered 2014-10-21: 1000 ug via INTRAMUSCULAR
  Filled 2014-10-21: qty 1

## 2014-10-28 ENCOUNTER — Inpatient Hospital Stay: Payer: Medicare PPO | Attending: Hematology and Oncology

## 2014-10-28 ENCOUNTER — Ambulatory Visit: Payer: Medicare PPO

## 2014-10-28 VITALS — BP 124/66 | HR 65 | Temp 97.8°F

## 2014-10-28 DIAGNOSIS — I10 Essential (primary) hypertension: Secondary | ICD-10-CM | POA: Diagnosis not present

## 2014-10-28 DIAGNOSIS — K219 Gastro-esophageal reflux disease without esophagitis: Secondary | ICD-10-CM | POA: Diagnosis not present

## 2014-10-28 DIAGNOSIS — D509 Iron deficiency anemia, unspecified: Secondary | ICD-10-CM | POA: Diagnosis not present

## 2014-10-28 DIAGNOSIS — I499 Cardiac arrhythmia, unspecified: Secondary | ICD-10-CM | POA: Diagnosis not present

## 2014-10-28 DIAGNOSIS — E538 Deficiency of other specified B group vitamins: Secondary | ICD-10-CM | POA: Diagnosis present

## 2014-10-28 DIAGNOSIS — R351 Nocturia: Secondary | ICD-10-CM | POA: Diagnosis not present

## 2014-10-28 DIAGNOSIS — R339 Retention of urine, unspecified: Secondary | ICD-10-CM | POA: Diagnosis not present

## 2014-10-28 DIAGNOSIS — E785 Hyperlipidemia, unspecified: Secondary | ICD-10-CM | POA: Diagnosis not present

## 2014-10-28 DIAGNOSIS — D538 Other specified nutritional anemias: Secondary | ICD-10-CM | POA: Diagnosis not present

## 2014-10-28 DIAGNOSIS — D696 Thrombocytopenia, unspecified: Secondary | ICD-10-CM | POA: Insufficient documentation

## 2014-10-28 DIAGNOSIS — N400 Enlarged prostate without lower urinary tract symptoms: Secondary | ICD-10-CM | POA: Diagnosis not present

## 2014-10-28 DIAGNOSIS — Z79899 Other long term (current) drug therapy: Secondary | ICD-10-CM | POA: Diagnosis not present

## 2014-10-28 MED ORDER — CYANOCOBALAMIN 1000 MCG/ML IJ SOLN
1000.0000 ug | Freq: Once | INTRAMUSCULAR | Status: AC
  Administered 2014-10-28: 1000 ug via INTRAMUSCULAR

## 2014-11-04 ENCOUNTER — Ambulatory Visit: Payer: Medicare PPO

## 2014-11-04 ENCOUNTER — Inpatient Hospital Stay: Payer: Medicare PPO

## 2014-11-04 VITALS — BP 122/77 | HR 80 | Temp 98.0°F

## 2014-11-04 DIAGNOSIS — D538 Other specified nutritional anemias: Secondary | ICD-10-CM | POA: Diagnosis not present

## 2014-11-04 DIAGNOSIS — E538 Deficiency of other specified B group vitamins: Secondary | ICD-10-CM

## 2014-11-04 MED ORDER — CYANOCOBALAMIN 1000 MCG/ML IJ SOLN
1000.0000 ug | Freq: Once | INTRAMUSCULAR | Status: AC
  Administered 2014-11-04: 1000 ug via INTRAMUSCULAR
  Filled 2014-11-04: qty 1

## 2014-11-11 ENCOUNTER — Ambulatory Visit: Payer: Medicare PPO

## 2014-11-11 ENCOUNTER — Inpatient Hospital Stay: Payer: Medicare PPO

## 2014-11-11 VITALS — BP 130/67 | HR 79 | Temp 97.8°F | Resp 18

## 2014-11-11 DIAGNOSIS — D538 Other specified nutritional anemias: Secondary | ICD-10-CM | POA: Diagnosis not present

## 2014-11-11 DIAGNOSIS — E538 Deficiency of other specified B group vitamins: Secondary | ICD-10-CM

## 2014-11-11 MED ORDER — CYANOCOBALAMIN 1000 MCG/ML IJ SOLN
1000.0000 ug | Freq: Once | INTRAMUSCULAR | Status: AC
  Administered 2014-11-11: 1000 ug via INTRAMUSCULAR

## 2014-11-18 ENCOUNTER — Inpatient Hospital Stay (HOSPITAL_BASED_OUTPATIENT_CLINIC_OR_DEPARTMENT_OTHER): Payer: Medicare PPO | Admitting: Hematology and Oncology

## 2014-11-18 ENCOUNTER — Inpatient Hospital Stay: Payer: Medicare PPO

## 2014-11-18 VITALS — BP 150/85 | HR 82 | Temp 97.4°F | Resp 18 | Ht 74.0 in | Wt 211.3 lb

## 2014-11-18 DIAGNOSIS — E538 Deficiency of other specified B group vitamins: Secondary | ICD-10-CM

## 2014-11-18 DIAGNOSIS — D696 Thrombocytopenia, unspecified: Secondary | ICD-10-CM

## 2014-11-18 DIAGNOSIS — Z79899 Other long term (current) drug therapy: Secondary | ICD-10-CM

## 2014-11-18 DIAGNOSIS — K219 Gastro-esophageal reflux disease without esophagitis: Secondary | ICD-10-CM

## 2014-11-18 DIAGNOSIS — D509 Iron deficiency anemia, unspecified: Secondary | ICD-10-CM

## 2014-11-18 DIAGNOSIS — R339 Retention of urine, unspecified: Secondary | ICD-10-CM

## 2014-11-18 DIAGNOSIS — D538 Other specified nutritional anemias: Secondary | ICD-10-CM | POA: Diagnosis not present

## 2014-11-18 DIAGNOSIS — I10 Essential (primary) hypertension: Secondary | ICD-10-CM

## 2014-11-18 DIAGNOSIS — E785 Hyperlipidemia, unspecified: Secondary | ICD-10-CM

## 2014-11-18 DIAGNOSIS — R351 Nocturia: Secondary | ICD-10-CM

## 2014-11-18 DIAGNOSIS — N400 Enlarged prostate without lower urinary tract symptoms: Secondary | ICD-10-CM

## 2014-11-18 DIAGNOSIS — I499 Cardiac arrhythmia, unspecified: Secondary | ICD-10-CM

## 2014-11-18 LAB — CBC WITH DIFFERENTIAL/PLATELET
Basophils Absolute: 0.1 10*3/uL (ref 0–0.1)
Basophils Relative: 1 %
Eosinophils Absolute: 0.1 10*3/uL (ref 0–0.7)
Eosinophils Relative: 2 %
HCT: 38.3 % — ABNORMAL LOW (ref 40.0–52.0)
Hemoglobin: 13 g/dL (ref 13.0–18.0)
Lymphocytes Relative: 20 %
Lymphs Abs: 1.3 10*3/uL (ref 1.0–3.6)
MCH: 30.7 pg (ref 26.0–34.0)
MCHC: 33.9 g/dL (ref 32.0–36.0)
MCV: 90.6 fL (ref 80.0–100.0)
Monocytes Absolute: 0.4 10*3/uL (ref 0.2–1.0)
Monocytes Relative: 6 %
Neutro Abs: 4.9 10*3/uL (ref 1.4–6.5)
Neutrophils Relative %: 71 %
Platelets: 117 10*3/uL — ABNORMAL LOW (ref 150–440)
RBC: 4.23 MIL/uL — ABNORMAL LOW (ref 4.40–5.90)
RDW: 13.2 % (ref 11.5–14.5)
WBC: 6.8 10*3/uL (ref 3.8–10.6)

## 2014-11-18 MED ORDER — CYANOCOBALAMIN 1000 MCG/ML IJ SOLN
1000.0000 ug | Freq: Once | INTRAMUSCULAR | Status: AC
  Administered 2014-11-18: 1000 ug via INTRAMUSCULAR
  Filled 2014-11-18: qty 1

## 2014-11-18 NOTE — Progress Notes (Signed)
No changes since last visit.  Reports seems like he has a little more energy

## 2014-11-23 DIAGNOSIS — R600 Localized edema: Secondary | ICD-10-CM | POA: Insufficient documentation

## 2014-12-16 ENCOUNTER — Other Ambulatory Visit: Payer: Self-pay | Admitting: Hematology and Oncology

## 2014-12-16 ENCOUNTER — Inpatient Hospital Stay: Payer: Medicare PPO | Attending: Hematology and Oncology

## 2014-12-16 VITALS — BP 154/91 | HR 80 | Temp 97.9°F

## 2014-12-16 DIAGNOSIS — E538 Deficiency of other specified B group vitamins: Secondary | ICD-10-CM | POA: Insufficient documentation

## 2014-12-16 DIAGNOSIS — Z79899 Other long term (current) drug therapy: Secondary | ICD-10-CM | POA: Insufficient documentation

## 2014-12-16 MED ORDER — CYANOCOBALAMIN 1000 MCG/ML IJ SOLN
1000.0000 ug | Freq: Once | INTRAMUSCULAR | Status: AC
  Administered 2014-12-16: 1000 ug via INTRAMUSCULAR

## 2014-12-17 DIAGNOSIS — I872 Venous insufficiency (chronic) (peripheral): Secondary | ICD-10-CM | POA: Insufficient documentation

## 2015-01-06 ENCOUNTER — Encounter: Payer: Self-pay | Admitting: Hematology and Oncology

## 2015-01-06 NOTE — Progress Notes (Signed)
[facility]  Clinic day:  11/18/2014  Chief Complaint: Jeremy QuinonesRichard L Herrera is a 74 y.o. male with thrombocytopenia who is seen for 1 week assessment.  HPI: The patient was last seen in the medical oncology clinic on 10/07/2014.  At that time, workup suggested a possible B12 deficiency. MMA was ordered and subsequently returned elevated and consistent with a B12 deficiency.  During the interim, he has received B12 weekly 5 (08/24, 08/31, 09/7, 09/14 and 09/21).  Symptomatically, he notes more energy. He has no new concerns. He denies any bruising or bleeding.  Past Medical History  Diagnosis Date  . GERD (gastroesophageal reflux disease)   . Anemia   . HTN (hypertension)   . Irregular heart beat   . HLD (hyperlipidemia)   . BPH (benign prostatic hyperplasia)   . Nocturia   . Urinary retention     Past Surgical History  Procedure Laterality Date  . Hip surgery Right   . Bladder patching    . Transurethral resection of prostate      Family History  Problem Relation Age of Onset  . Ulcers Mother     stomach  . GER disease Sister     GERD  . Heart attack Father   . Diabetes Brother   . Depression Brother   . Hiatal hernia Mother     Social History:  reports that he has never smoked. He has never used smokeless tobacco. He reports that he does not drink alcohol or use illicit drugs.   Allergies:  Allergies  Allergen Reactions  . Hydrocodone-Acetaminophen Nausea And Vomiting    Current Medications: Current Outpatient Prescriptions  Medication Sig Dispense Refill  . aspirin 81 MG tablet Take 81 mg by mouth daily.    . bimatoprost (LUMIGAN) 0.01 % SOLN Apply to eye.    . dorzolamide-timolol (COSOPT) 22.3-6.8 MG/ML ophthalmic solution Apply to eye.    . dutasteride (AVODART) 0.5 MG capsule Take 1 capsule (0.5 mg total) by mouth daily. 30 capsule 12  . esomeprazole (NEXIUM) 20 MG capsule Take 20 mg by mouth daily at 12 noon.    .  Naproxen Sodium (ALEVE) 220 MG CAPS Take 1 capsule by mouth as needed.    . tadalafil (CIALIS) 5 MG tablet Take 5 mg by mouth daily.     . vitamin B-12 (CYANOCOBALAMIN) 1000 MCG tablet Take 1,000 mcg by mouth daily.    . ferrous sulfate 325 (65 FE) MG tablet Take 325 mg by mouth daily with breakfast.     No current facility-administered medications for this visit.    Review of Systems:  GENERAL:  More energy.  No fevers, sweats or weight loss. PERFORMANCE STATUS (ECOG): 0 HEENT:  No visual changes, runny nose, sore throat, mouth sores or tenderness. Lungs: No shortness of breath or cough.  No hemoptysis. Cardiac:  No chest pain, palpitations, orthopnea, or PND. GI:  No nausea, vomiting, diarrhea, constipation, melena or hematochezia. GU:  No urgency, frequency, dysuria, or hematuria. Musculoskeletal:  No back pain.  No joint pain.  No muscle tenderness. Extremities:  Lower extremity swelling. Skin:  No rashes or skin changes. Neuro:  No headache, numbness or weakness, balance or coordination issues. Endocrine:  No diabetes, thyroid issues, hot flashes or night sweats. Psych:  No mood changes, depression or anxiety. Pain:  No focal pain. Review of systems:  All other systems reviewed and found to be negative.  Physical Exam: Blood pressure 150/85, pulse 82, temperature 97.4  F (36.3 C), temperature source Tympanic, resp. rate 18, height  (1.88 m), weight 211 lb 5 oz (95.85 kg). GENERAL:  Well developed, well nourished, tall gentleman sitting comfortably in the exam room in no acute distress.  He has a cane at his side. MENTAL STATUS:  Alert and oriented to person, place and time. HEAD:  Very short gray hair.  Normocephalic, atraumatic, face symmetric, no Cushingoid features. EYES:  Glasses.  Blue eyes. Pupils equal round and reactive to light and accomodation.  No conjunctivitis or scleral icterus. ENT:  Oropharynx clear without lesion.  Tongue normal. Mucous membranes moist.   RESPIRATORY:  Clear to auscultation without rales, wheezes or rhonchi. CARDIOVASCULAR:  Regular rate and rhythm without murmur, rub or gallop. ABDOMEN:  Soft, non-tender, with active bowel sounds, and no hepatosplenomegaly.  No masses. SKIN:  No rashes, ulcers or lesions. EXTREMITIES: No edema, no skin discoloration or tenderness.  No palpable cords. LYMPH NODES: No palpable cervical, supraclavicular, axillary or inguinal adenopathy  NEUROLOGICAL: Unremarkable. PSYCH:  Appropriate.   Infusion on 11/18/2014  Component Date Value Ref Range Status  . WBC 11/18/2014 6.8  3.8 - 10.6 K/uL Final  . RBC 11/18/2014 4.23* 4.40 - 5.90 MIL/uL Final  . Hemoglobin 11/18/2014 13.0  13.0 - 18.0 g/dL Final  . HCT 16/11/9602 38.3* 40.0 - 52.0 % Final  . MCV 11/18/2014 90.6  80.0 - 100.0 fL Final  . MCH 11/18/2014 30.7  26.0 - 34.0 pg Final  . MCHC 11/18/2014 33.9  32.0 - 36.0 g/dL Final  . RDW 54/10/8117 13.2  11.5 - 14.5 % Final  . Platelets 11/18/2014 117* 150 - 440 K/uL Final  . Neutrophils Relative % 11/18/2014 71   Final  . Neutro Abs 11/18/2014 4.9  1.4 - 6.5 K/uL Final  . Lymphocytes Relative 11/18/2014 20   Final  . Lymphs Abs 11/18/2014 1.3  1.0 - 3.6 K/uL Final  . Monocytes Relative 11/18/2014 6   Final  . Monocytes Absolute 11/18/2014 0.4  0.2 - 1.0 K/uL Final  . Eosinophils Relative 11/18/2014 2   Final  . Eosinophils Absolute 11/18/2014 0.1  0 - 0.7 K/uL Final  . Basophils Relative 11/18/2014 1   Final  . Basophils Absolute 11/18/2014 0.1  0 - 0.1 K/uL Final    Assessment:  Jeremy Herrera is a 74 y.o. 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  He denies any new medications or herbal products. He has no known autoimmune disease. He has never required transfusion. He recently had a ulceration of the left fourth toe and cellulitis requiring antibiotics (Keflex).  Work-up on 09/16/2014 revealed  a hematocrit 36.4, hemoglobin 12.3, MCV 91.5, platelets 124,000 white count 4800 with an ANC of 3000. Differential was unremarkable. B12 was 262 (low normal) with an elevated MMA consistent with B12 deficiency.  He began weekly B12 x 6 on 10/14/2014, prior to initiation of monthly B12  Normal labs included: folate, CMP, ANA, iron studies, hepatitis C antibody, hepatitis B surface antigen and core antibody, HIV testing, SPEP, free light chains, and PTT. TSH  was normal on 08/25/2014.  UPEP revealed no monoclonal protein.  He has a history of iron deficiency anemia. EGD and colonoscopy 10 years ago were negative. He required esophageal dilatation. He has a history of reflux. Colonoscopy 3-4 years ago was unremarkable per the patient. His diet is good. He eats meat twice a week. He is on oral iron.  Symptomatically,  his energy level has improved since initiation of B12.  Exam is unremarkable.  Plan: 1.  Labs today:  CBC with diff. 2.  Week #6 B12 today. 3.  RTC monthly for B12. 4.  RTC in 3 months for MD assessment and labs (CBC with diff, ferritin, iron studies).   Rosey Bath, MD  11/18/2014

## 2015-01-06 NOTE — Progress Notes (Signed)
[facility]-  Cancer Center  Clinic day:  09/30/2014  Chief Complaint: Jeremy Herrera is a 74 y.o. male with thrombocytopenia who is seen for review of work-up and discussion regarding direction of therapy.  HPI: The patient was last seen in medical oncology clinic in 09/16/2014 for initial consultation.  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  He denied any new medications or herbal products. He recently had a ulceration of the left fourth toe and cellulitis requiring antibiotics (Keflex).  He had a history of iron deficiency anemia. EGD and colonoscopy 10 years ago were negative. He required esophageal  dilatation. He has a history of reflux. Colonoscopy 3-4 years ago was unremarkable per the patient. His diet was good. He ate meat twice a week. He was on oral iron. He felt good. Exam revealed no adenopathy or hepatosplenomegaly.  Labs on 09/16/2014 revealed a hematocrit 36.4, hemoglobin 12.3, MCV 91.5, platelets 124,000 white count 4800 with an ANC of 3000. Differential was unremarkable. B12 was 262 (low normal).  Folate was 9 .  CMP revealed a creatinine of 1.01, protein 6.2, albumin 3.6 and calcium 8.5. Liver function tests were normal. ANA was negative.  Iron studies included a saturation 26% and a TIBC of 314. Hepatitis C antibody, hepatitis B surface antigen and core antibody, and HIV testing was negative. SPEP revealed no monoclonal spike. Kappa free light chains were 26.77, lambda free light chains 17.59 with a ratio 1.5 (normal).  PTT was 32 (normal).  Symptomatically, he notes some fatigue.  He also notes some redness in his right foot and ankle more than the left. He has lower extremity edema.  He believes his ejection fraction is normal.  He denies any bruising or bleeding.   Past Medical History  Diagnosis Date  . GERD (gastroesophageal reflux disease)   . Anemia   . HTN (hypertension)   . Irregular heart beat   . HLD  (hyperlipidemia)   . BPH (benign prostatic hyperplasia)   . Nocturia   . Urinary retention     Past Surgical History  Procedure Laterality Date  . Hip surgery Right   . Bladder patching    . Transurethral resection of prostate      Family History  Problem Relation Age of Onset  . Ulcers Mother     stomach  . GER disease Sister     GERD  . Heart attack Father   . Diabetes Brother   . Depression Brother   . Hiatal hernia Mother     Social History:  reports that he has never smoked. He has never used smokeless tobacco. He reports that he does not drink alcohol or use illicit drugs.  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 The patient is accompanied by his daughter-in-law today.  Allergies:  Allergies  Allergen Reactions  . Hydrocodone-Acetaminophen Nausea And Vomiting    Current Medications: Current Outpatient Prescriptions  Medication Sig Dispense Refill  . aspirin 81 MG tablet Take 81 mg by mouth daily.    . bimatoprost (LUMIGAN) 0.01 % SOLN Apply to eye.    . dorzolamide-timolol (COSOPT) 22.3-6.8 MG/ML ophthalmic solution Apply to eye.    . esomeprazole (NEXIUM) 20 MG capsule Take 20 mg by mouth daily at 12 noon.    . ferrous sulfate 325 (65 FE) MG tablet Take 325 mg by mouth daily with breakfast.    . Naproxen Sodium (ALEVE) 220 MG CAPS Take 1 capsule by mouth as  needed.    . vitamin B-12 (CYANOCOBALAMIN) 1000 MCG tablet Take 1,000 mcg by mouth daily.    Marland Kitchen dutasteride (AVODART) 0.5 MG capsule Take 1 capsule (0.5 mg total) by mouth daily. 30 capsule 12  . tadalafil (CIALIS) 5 MG tablet Take 5 mg by mouth daily.      No current facility-administered medications for this visit.    Review of Systems:  GENERAL:  Feels tired.  No fevers or sweats.  Weight stable in past year.Marland Kitchen PERFORMANCE STATUS (ECOG):  0 HEENT:  No visual changes, runny nose, sore throat, mouth sores or tenderness. Lungs: No shortness of breath or cough.  No hemoptysis. Cardiac:  No  chest pain, palpitations, orthopnea, or PND. GI:  No nausea, vomiting, diarrhea, constipation, melena or hematochezia. GU:  No urgency, frequency, dysuria, or hematuria. Musculoskeletal:  No back pain.  No joint pain.  No muscle tenderness. Extremities:  No pain or swelling. Skin:  Ankle redness.  No rashes. Neuro:  No headache, numbness or weakness, balance or coordination issues. Endocrine:  No diabetes, thyroid issues, hot flashes or night sweats. Psych:  No mood changes, depression or anxiety. Pain:  No focal pain. Review of systems:  All other systems reviewed and found to be negative.  Physical Exam: Blood pressure 148/89, pulse 87, temperature 97.8 F (36.6 C), temperature source Tympanic, resp. rate 18, height  (1.88 m), weight 203 lb 11.3 oz (92.4 kg), SpO2 99 %. GENERAL:  Well developed, well nourished, tall man sitting comfortably in the exam room in no acute distress. MENTAL STATUS:  Alert and oriented to person, place and time. HEAD:  Short gray hair.  Male pattern baldness.  Normocephalic, atraumatic, face symmetric, no Cushingoid features. EYES:  Glasses.  Blue eyes. No conjunctivitis or scleral icterus. EXTREMITIES: 3+ right lower extremity edema, 2+ left lower extremity edema. NEUROLOGICAL: Unremarkable. PSYCH:  Appropriate.  Assessment:  Jeremy Herrera is a 74 y.o. 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  He denies any new medications or herbal products. He has no known autoimmune disease. He has never required transfusion. He recently had a ulceration of the left fourth toe and cellulitis requiring antibiotics (Keflex).  Work-up on 09/16/2014 revealed a hematocrit 36.4, hemoglobin 12.3, MCV 91.5, platelets 124,000 white count 4800 with an ANC of 3000. Differential was unremarkable. B12 was 262 (low normal).  Normal labs included: folate, CMP, ANA, iron studies , hepatitis C antibody, hepatitis B surface antigen and core antibody, HIV  testing, SPEP, free light chains, and PTT.  TSH was normal on 08/25/2014.  He has a history of iron deficiency anemia. EGD and colonoscopy 10 years ago were negative. He required esophageal  dilatation. He has a history of reflux. Colonoscopy 3-4 years ago was unremarkable per the patient. His diet is good. He eats meat twice a week. He is on oral iron.  Symptomatically, he is fatigued. Exam reveals 3+ right lower extremity edema and 2+ left lower extremity edema.  He has no adenopathy or hepatosplenomegaly.  Plan: 1. Review work-up and probable B12 deficiency.. 2. Labs today:  . 3. Follow-up 24 hour urine for UPEP and free light chains. 4. Right lower extremity duplex- r/o DVT. 5. Nurse to call patient with lab results.  6. If MMA elevated, preauthorize B12 injections. 7. RTC in 1 week.   Rosey Bath, MD  09/30/2014

## 2015-01-07 ENCOUNTER — Encounter: Payer: Self-pay | Admitting: Hematology and Oncology

## 2015-01-08 ENCOUNTER — Encounter: Payer: Self-pay | Admitting: Urology

## 2015-01-08 ENCOUNTER — Ambulatory Visit (INDEPENDENT_AMBULATORY_CARE_PROVIDER_SITE_OTHER): Payer: Medicare PPO | Admitting: Urology

## 2015-01-08 VITALS — BP 156/76 | HR 76 | Ht 74.0 in | Wt 219.5 lb

## 2015-01-08 DIAGNOSIS — N400 Enlarged prostate without lower urinary tract symptoms: Secondary | ICD-10-CM | POA: Diagnosis not present

## 2015-01-08 DIAGNOSIS — Z87448 Personal history of other diseases of urinary system: Secondary | ICD-10-CM | POA: Diagnosis not present

## 2015-01-08 DIAGNOSIS — N138 Other obstructive and reflux uropathy: Secondary | ICD-10-CM

## 2015-01-08 DIAGNOSIS — Z87898 Personal history of other specified conditions: Secondary | ICD-10-CM

## 2015-01-08 DIAGNOSIS — R351 Nocturia: Secondary | ICD-10-CM | POA: Diagnosis not present

## 2015-01-08 DIAGNOSIS — N401 Enlarged prostate with lower urinary tract symptoms: Secondary | ICD-10-CM | POA: Diagnosis not present

## 2015-01-08 LAB — BLADDER SCAN AMB NON-IMAGING: Scan Result: 54

## 2015-01-08 NOTE — Progress Notes (Signed)
10:43 AM   Jeremy Herrera 10/04/1940 [id number]  Referring provider: Mickey Farberavid Thies, MD [address] [facility] [place], [place] [id number]  Chief Complaint  Patient presents with  . Benign Prostatic Hypertrophy    3  month follow up    HPI: Patient is a 74 year old white male who presents today for a three month follow up after being on dutasteride 0.5 mg daily to see if his LUTS would improve.    Background history Patient is a 74 year old white male with a history of BPH with LUTS who is quite tearful today because he lost his wife this past July.  He presents today for a 3 month follow-up after being initiated on Cialis 5 mg daily.  His last IPSS score  was 19, which is moderate lower urinary tract symptomatology. He is mixed with his quality life due to his urinary symptoms.  His major complaint today is nocturia x 3.  He has had these symptoms for many years.  He denies any dysuria, hematuria or suprapubic pain.   He is no longer taking the Cialis 5 mg daily.  His has had TURP with the removal of a bladder diverticulum in the remote past.  In the last couple of years, he had undergone 2 cystoscopic examinations. Both of the cystoscopic examinations did not identify any significant bladder outlet obstruction.     He also denies any recent fevers, chills, nausea or vomiting.  He does not have a family history of PCa.      IPSS      01/08/15 0900       International Prostate Symptom Score   How often have you had the sensation of not emptying your bladder? Less than 1 in 5     How often have you had to urinate less than every two hours? About half the time     How often have you found you stopped and started again several times when you urinated? About half the time     How often have you found it difficult to postpone urination? Less than 1 in 5 times     How often have you had a weak urinary stream? About half the time     How often have you had to strain  to start urination? More than half the time     How many times did you typically get up at night to urinate? 3 Times     Total IPSS Score 18     Quality of Life due to urinary symptoms   If you were to spend the rest of your life with your urinary condition just the way it is now how would you feel about that? Mixed        Score:  1-7 Mild 8-19 Moderate 20-35 Severe  PMH: Past Medical History  Diagnosis Date  . GERD (gastroesophageal reflux disease)   . Anemia   . HTN (hypertension)   . Irregular heart beat   . HLD (hyperlipidemia)   . BPH (benign prostatic hyperplasia)   . Nocturia   . Urinary retention     Surgical History: Past Surgical History  Procedure Laterality Date  . Hip surgery Right 2014  . Bladder patching  1964  . Transurethral resection of prostate      Home Medications:    Medication List       This list is accurate as of: 01/08/15 11:59 PM.  Always use your most recent med  list.               acetaminophen 500 MG tablet  Commonly known as:  TYLENOL  Take by mouth.     ALEVE 220 MG Caps  Generic drug:  Naproxen Sodium  Take 1 capsule by mouth as needed.     aspirin 81 MG tablet  Take 81 mg by mouth daily.     bimatoprost 0.01 % Soln  Commonly known as:  LUMIGAN  Apply to eye.     dorzolamide-timolol 22.3-6.8 MG/ML ophthalmic solution  Commonly known as:  COSOPT  Apply to eye.     dutasteride 0.5 MG capsule  Commonly known as:  AVODART  Take 1 capsule (0.5 mg total) by mouth daily.     esomeprazole 20 MG capsule  Commonly known as:  NEXIUM  Take 20 mg by mouth daily at 12 noon.     ferrous sulfate 325 (65 FE) MG tablet  Take 325 mg by mouth daily with breakfast.     furosemide 20 MG tablet  Commonly known as:  LASIX  Take by mouth.     tadalafil 5 MG tablet  Commonly known as:  CIALIS  Take 5 mg by mouth daily.     vitamin B-12 1000 MCG tablet  Commonly known as:  CYANOCOBALAMIN  Take by mouth every 30 (thirty) days.  Injection        Allergies:  Allergies  Allergen Reactions  . Hydrocodone-Acetaminophen Nausea And Vomiting    Family History: Family History  Problem Relation Age of Onset  . Ulcers Mother     stomach  . GER disease Sister     GERD  . Heart attack Father   . Diabetes Brother   . Depression Brother   . Hiatal hernia Mother     Social History:  reports that he has never smoked. He has never used smokeless tobacco. He reports that he does not drink alcohol or use illicit drugs.  ROS: UROLOGY Frequent Urination?: No Hard to postpone urination?: No Burning/pain with urination?: No Get up at night to urinate?: Yes Leakage of urine?: No Urine stream starts and stops?: Yes Trouble starting stream?: Yes Do you have to strain to urinate?: Yes Blood in urine?: No Urinary tract infection?: No Sexually transmitted disease?: No Injury to kidneys or bladder?: No Painful intercourse?: No Weak stream?: No Erection problems?: No Penile pain?: No  Gastrointestinal Nausea?: No Vomiting?: No Indigestion/heartburn?: No Diarrhea?: No Constipation?: No  Constitutional Fever: No Night sweats?: No Weight loss?: No Fatigue?: No  Skin Skin rash/lesions?: No Itching?: Yes  Eyes Blurred vision?: No Double vision?: No  Ears/Nose/Throat Sore throat?: No Sinus problems?: No  Hematologic/Lymphatic Swollen glands?: No Easy bruising?: Yes  Cardiovascular Leg swelling?: Yes Chest pain?: No  Respiratory Cough?: No Shortness of breath?: No  Endocrine Excessive thirst?: No  Musculoskeletal Back pain?: No Joint pain?: No  Neurological Headaches?: No Dizziness?: No  Psychologic Depression?: Yes Anxiety?: No  Physical Exam: BP 156/76 mmHg  Pulse 76  Ht  (1.88 m)  Wt 219 lb 8 oz (99.565 kg)  BMI 28.17 kg/m2  Constitutional: Well nourished. Alert and oriented, No acute distress. HEENT: [place] AT, moist mucus membranes. Trachea midline, no  masses. Cardiovascular: No clubbing, cyanosis, or edema. Respiratory: Normal respiratory effort, no increased work of breathing. GI: Abdomen is soft, non tender, non distended, no abdominal masses. Liver and spleen not palpable.  No hernias appreciated.  Stool sample for occult testing is not indicated.  GU: No CVA tenderness.  No bladder fullness or masses.   Skin: No rashes, bruises or suspicious lesions. Lymph: No cervical or inguinal adenopathy. Neurologic: Grossly intact, no focal deficits, moving all 4 extremities. Psychiatric: Normal mood and affect.  Laboratory Data: Lab Results  Component Value Date   WBC 6.8 11/18/2014   HGB 13.0 11/18/2014   HCT 38.3* 11/18/2014   MCV 90.6 11/18/2014   PLT 117* 11/18/2014    Lab Results  Component Value Date   CREATININE 1.01 09/16/2014    Lab Results  Component Value Date   PSA 0.2 10/04/2011   PSA history:  0.4 ng/mL on 06/21/2012  0.1 ng/mL on 07/02/2013  0.1 ng/mL on 04/10/2014  Pertinent Imaging: Results for orders placed or performed in visit on 01/08/15  BLADDER SCAN AMB NON-IMAGING  Result Value Ref Range   Scan Result 54     Assessment & Plan:    1. BPH (benign prostatic hyperplasia) with LUTS:  Patient's IPSS score is 18/3.  His PVR 54 mL.  His DRE demonstrates enlargement, no nodules.  He has discontinued the Cialis 5 mg daily.  He has found improvement with the dutasteride 0.5 mg daily.  He will continue that medication.  He will return to the clinic in 6 months for IPSS score and exam.  2. Nocturia:   I explained to the patient that nocturia is often multi-factorial and difficult to treat.  Sleeping disorders, heart conditions and peripheral vascular disease, diabetes,  enlarged prostate or urethral stricture causing bladder outlet obstruction and/or certain medications.  I have suggested that the patient avoid caffeine and alcohol in the evening. He may also benefit from fluid restrictions after 6:00 in the  evening and voiding just prior to bedtime.    3. History of urinary retention:   Patient has a history of urinary retention which has resolved with medical therapy for BPH. We will continue to monitor. His PVR is minimal today.  - Bladder Scan (Post Void Residual) in office   Return in about 6 months (around 07/08/2015) for IPSS score and PVR.  Michiel Cowboy, PA-C  [facility] [address] [place], [place] [address] [phone]

## 2015-01-13 ENCOUNTER — Inpatient Hospital Stay: Payer: Medicare PPO | Attending: Hematology and Oncology

## 2015-01-13 VITALS — BP 138/81 | HR 81 | Temp 96.8°F | Resp 18

## 2015-01-13 DIAGNOSIS — Z79899 Other long term (current) drug therapy: Secondary | ICD-10-CM | POA: Insufficient documentation

## 2015-01-13 DIAGNOSIS — E538 Deficiency of other specified B group vitamins: Secondary | ICD-10-CM | POA: Insufficient documentation

## 2015-01-13 MED ORDER — CYANOCOBALAMIN 1000 MCG/ML IJ SOLN
1000.0000 ug | Freq: Once | INTRAMUSCULAR | Status: AC
  Administered 2015-01-13: 1000 ug via INTRAMUSCULAR
  Filled 2015-01-13: qty 1

## 2015-02-12 ENCOUNTER — Other Ambulatory Visit: Payer: Self-pay

## 2015-02-12 DIAGNOSIS — D696 Thrombocytopenia, unspecified: Secondary | ICD-10-CM

## 2015-02-17 ENCOUNTER — Inpatient Hospital Stay (HOSPITAL_BASED_OUTPATIENT_CLINIC_OR_DEPARTMENT_OTHER): Payer: Medicare PPO | Admitting: Hematology and Oncology

## 2015-02-17 ENCOUNTER — Other Ambulatory Visit: Payer: Self-pay

## 2015-02-17 ENCOUNTER — Inpatient Hospital Stay: Payer: Medicare PPO | Attending: Hematology and Oncology

## 2015-02-17 ENCOUNTER — Inpatient Hospital Stay: Payer: Medicare PPO

## 2015-02-17 VITALS — BP 141/73 | HR 73 | Temp 97.1°F | Resp 18 | Ht 74.0 in | Wt 219.1 lb

## 2015-02-17 DIAGNOSIS — Z7982 Long term (current) use of aspirin: Secondary | ICD-10-CM | POA: Diagnosis not present

## 2015-02-17 DIAGNOSIS — E538 Deficiency of other specified B group vitamins: Secondary | ICD-10-CM | POA: Diagnosis not present

## 2015-02-17 DIAGNOSIS — R351 Nocturia: Secondary | ICD-10-CM | POA: Insufficient documentation

## 2015-02-17 DIAGNOSIS — E785 Hyperlipidemia, unspecified: Secondary | ICD-10-CM | POA: Diagnosis not present

## 2015-02-17 DIAGNOSIS — N400 Enlarged prostate without lower urinary tract symptoms: Secondary | ICD-10-CM | POA: Insufficient documentation

## 2015-02-17 DIAGNOSIS — R1310 Dysphagia, unspecified: Secondary | ICD-10-CM

## 2015-02-17 DIAGNOSIS — Z79899 Other long term (current) drug therapy: Secondary | ICD-10-CM | POA: Insufficient documentation

## 2015-02-17 DIAGNOSIS — K219 Gastro-esophageal reflux disease without esophagitis: Secondary | ICD-10-CM

## 2015-02-17 DIAGNOSIS — Z8619 Personal history of other infectious and parasitic diseases: Secondary | ICD-10-CM | POA: Insufficient documentation

## 2015-02-17 DIAGNOSIS — D649 Anemia, unspecified: Secondary | ICD-10-CM

## 2015-02-17 DIAGNOSIS — D696 Thrombocytopenia, unspecified: Secondary | ICD-10-CM | POA: Insufficient documentation

## 2015-02-17 DIAGNOSIS — I10 Essential (primary) hypertension: Secondary | ICD-10-CM

## 2015-02-17 DIAGNOSIS — I499 Cardiac arrhythmia, unspecified: Secondary | ICD-10-CM

## 2015-02-17 DIAGNOSIS — Z862 Personal history of diseases of the blood and blood-forming organs and certain disorders involving the immune mechanism: Secondary | ICD-10-CM | POA: Diagnosis not present

## 2015-02-17 DIAGNOSIS — R339 Retention of urine, unspecified: Secondary | ICD-10-CM | POA: Diagnosis not present

## 2015-02-17 LAB — CBC WITH DIFFERENTIAL/PLATELET
Basophils Absolute: 0.1 10*3/uL (ref 0–0.1)
Basophils Relative: 1 %
Eosinophils Absolute: 0.2 10*3/uL (ref 0–0.7)
Eosinophils Relative: 3 %
HCT: 38.9 % — ABNORMAL LOW (ref 40.0–52.0)
Hemoglobin: 13.1 g/dL (ref 13.0–18.0)
Lymphocytes Relative: 28 %
Lymphs Abs: 1.3 10*3/uL (ref 1.0–3.6)
MCH: 30.9 pg (ref 26.0–34.0)
MCHC: 33.7 g/dL (ref 32.0–36.0)
MCV: 91.6 fL (ref 80.0–100.0)
Monocytes Absolute: 0.3 10*3/uL (ref 0.2–1.0)
Monocytes Relative: 7 %
Neutro Abs: 2.9 10*3/uL (ref 1.4–6.5)
Neutrophils Relative %: 61 %
Platelets: 125 10*3/uL — ABNORMAL LOW (ref 150–440)
RBC: 4.25 MIL/uL — ABNORMAL LOW (ref 4.40–5.90)
RDW: 13.6 % (ref 11.5–14.5)
WBC: 4.8 10*3/uL (ref 3.8–10.6)

## 2015-02-17 LAB — IRON AND TIBC
Iron: 103 ug/dL (ref 45–182)
Saturation Ratios: 30 % (ref 17.9–39.5)
TIBC: 347 ug/dL (ref 250–450)
UIBC: 244 ug/dL

## 2015-02-17 LAB — FERRITIN: Ferritin: 43 ng/mL (ref 24–336)

## 2015-02-17 MED ORDER — CYANOCOBALAMIN 1000 MCG/ML IJ SOLN
1000.0000 ug | Freq: Once | INTRAMUSCULAR | Status: AC
Start: 2015-02-17 — End: 2015-02-17
  Administered 2015-02-17: 1000 ug via INTRAMUSCULAR
  Filled 2015-02-17: qty 1

## 2015-02-17 NOTE — Progress Notes (Signed)
[facility]-  Cancer Center  Clinic day:  02/17/2015   Chief Complaint: Jeremy Herrera is a 74 y.o. male with thrombocytopenia and B12 deficiency who is seen for 3 month assessment.  HPI: The patient was last seen in the medical oncology clinic on 11/18/2014.  At that time, his energy level has improved since initiation of B12. Exam is unremarkable.  CBC revealed a hematocrit of 38.3, hemoglobin 13, MCV 90.6, platelets 117,000, white count 6800 with an ANC of 4900.  He received his monthly B12.  During the interim, he has been taking iron. He states that Dr. Harrington Challenger put him on Lasix secondary to fluid retention.  He denies any bruising or bleeding. His diet is good. He denies any melena or hematochezia.   He is exercising, babysitting, house sitting and doing odd jobs. He describes himself as being very active. He denies any chest pain.  He only notes some trouble swallowing, so he has to eat slow.  He clears his throat periodically.  Past Medical History  Diagnosis Date  . GERD (gastroesophageal reflux disease)   . Anemia   . HTN (hypertension)   . Irregular heart beat   . HLD (hyperlipidemia)   . BPH (benign prostatic hyperplasia)   . Nocturia   . Urinary retention     Past Surgical History  Procedure Laterality Date  . Hip surgery Right 2014  . Bladder patching  1964  . Transurethral resection of prostate      Family History  Problem Relation Age of Onset  . Ulcers Mother     stomach  . GER disease Sister     GERD  . Heart attack Father   . Diabetes Brother   . Depression Brother   . Hiatal hernia Mother     Social History:  reports that he has never smoked. He has never used smokeless tobacco. He reports that he does not drink alcohol or use illicit drugs.   He is alone today.  Allergies:  Allergies  Allergen Reactions  . Hydrocodone-Acetaminophen Nausea And Vomiting    Current Medications: Current Outpatient Prescriptions  Medication  Sig Dispense Refill  . acetaminophen (TYLENOL) 500 MG tablet Take by mouth.    Marland Kitchen aspirin 81 MG tablet Take 81 mg by mouth daily.    . bimatoprost (LUMIGAN) 0.01 % SOLN Apply to eye.    . dorzolamide-timolol (COSOPT) 22.3-6.8 MG/ML ophthalmic solution Apply to eye.    . dutasteride (AVODART) 0.5 MG capsule Take 1 capsule (0.5 mg total) by mouth daily. 30 capsule 12  . esomeprazole (NEXIUM) 20 MG capsule Take 20 mg by mouth daily at 12 noon.    . ferrous sulfate 325 (65 FE) MG tablet Take 325 mg by mouth daily with breakfast.    . furosemide (LASIX) 20 MG tablet Take by mouth.    . Naproxen Sodium (ALEVE) 220 MG CAPS Take 1 capsule by mouth as needed.    . tadalafil (CIALIS) 5 MG tablet Take 5 mg by mouth daily.     . vitamin B-12 (CYANOCOBALAMIN) 1000 MCG tablet Take by mouth every 30 (thirty) days. Injection     No current facility-administered medications for this visit.    Review of Systems:  GENERAL:  Feels good.  Very active.  No fevers, sweats or weight loss. PERFORMANCE STATUS (ECOG): 0 HEENT:  No visual changes, runny nose, sore throat, mouth sores or tenderness. Lungs: No shortness of breath or cough.  No hemoptysis. Cardiac:  No  chest pain, palpitations, orthopnea, or PND. GI:  Diet good.  Trouble swallowing- eating slowly x 1 month.  No nausea, vomiting, diarrhea, constipation, melena or hematochezia. GU:  No urgency, frequency, dysuria, or hematuria. Musculoskeletal:  No back pain.  No joint pain.  No muscle tenderness. Extremities:  Lower extremity swelling. Skin:  No rashes or skin changes. Neuro:  No headache, numbness or weakness, balance or coordination issues. Endocrine:  No diabetes, thyroid issues, hot flashes or night sweats. Psych:  No mood changes, depression or anxiety. Pain:  No focal pain. Review of systems:  All other systems reviewed and found to be negative.  Physical Exam: Blood pressure 141/73, pulse 73, temperature 97.1 F (36.2 C), temperature source  Tympanic, resp. rate 18, height 6\' 2"  (1.88 m), weight 219 lb 2.2 oz (99.4 kg). GENERAL:  Well developed, well nourished, tall gentleman sitting comfortably in the exam room in no acute distress.  He has a cane at his side. MENTAL STATUS:  Alert and oriented to person, place and time. HEAD:  Wallace CullensGray hair.  Male pattern baldness.  Normocephalic, atraumatic, face symmetric, no Cushingoid features. EYES:  Glasses.  Blue eyes. Pupils equal round and reactive to light and accomodation.  No conjunctivitis or scleral icterus. ENT:  Oropharynx clear without lesion.  Tongue normal. Mucous membranes moist.  RESPIRATORY:  Clear to auscultation without rales, wheezes or rhonchi. CARDIOVASCULAR:  Regular rate and rhythm without murmur, rub or gallop. ABDOMEN:  Soft, non-tender, with active bowel sounds, and no hepatosplenomegaly.  No masses. SKIN:  Ecchymosis on arms.  No rashes, ulcers or lesions. EXTREMITIES: No edema, no skin discoloration or tenderness.  No palpable cords. LYMPH NODES: No palpable cervical, supraclavicular, axillary or inguinal adenopathy  NEUROLOGICAL: Unremarkable. PSYCH:  Appropriate.   Appointment on 02/17/2015  Component Date Value Ref Range Status  . WBC 02/17/2015 4.8  3.8 - 10.6 K/uL Final  . RBC 02/17/2015 4.25* 4.40 - 5.90 MIL/uL Final  . Hemoglobin 02/17/2015 13.1  13.0 - 18.0 g/dL Final  . HCT 62/95/284112/28/2016 38.9* 40.0 - 52.0 % Final  . MCV 02/17/2015 91.6  80.0 - 100.0 fL Final  . MCH 02/17/2015 30.9  26.0 - 34.0 pg Final  . MCHC 02/17/2015 33.7  32.0 - 36.0 g/dL Final  . RDW 32/44/010212/28/2016 13.6  11.5 - 14.5 % Final  . Platelets 02/17/2015 125* 150 - 440 K/uL Final  . Neutrophils Relative % 02/17/2015 61   Final  . Neutro Abs 02/17/2015 2.9  1.4 - 6.5 K/uL Final  . Lymphocytes Relative 02/17/2015 28   Final  . Lymphs Abs 02/17/2015 1.3  1.0 - 3.6 K/uL Final  . Monocytes Relative 02/17/2015 7   Final  . Monocytes Absolute 02/17/2015 0.3  0.2 - 1.0 K/uL Final  . Eosinophils  Relative 02/17/2015 3   Final  . Eosinophils Absolute 02/17/2015 0.2  0 - 0.7 K/uL Final  . Basophils Relative 02/17/2015 1   Final  . Basophils Absolute 02/17/2015 0.1  0 - 0.1 K/uL Final    Assessment:  Jeremy Herrera is a 74 y.o. 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  He denies any new medications or herbal products. He has no known autoimmune disease. He has never required transfusion. He recently had a ulceration of the left fourth toe and cellulitis requiring antibiotics (Keflex).  Work-up on 09/16/2014 revealed a hematocrit 36.4, hemoglobin 12.3, MCV 91.5, platelets 124,000 white count 4800 with an ANC of  3000. Differential was unremarkable. B12 was 262 (low normal) with an elevated MMA consistent with B12 deficiency.  He began weekly B12 x 6 on 10/14/2014, prior to initiation of monthly B12  Normal labs included: folate, CMP, ANA, iron studies, hepatitis C antibody, hepatitis B surface antigen and core antibody, HIV testing, SPEP, free light chains, and PTT. TSH  was normal on 08/25/2014.  UPEP revealed no monoclonal protein.  He has a history of iron deficiency anemia. EGD and colonoscopy 10 years ago were negative. He required esophageal dilatation. He has a history of reflux. Colonoscopy 3-4 years ago was unremarkable per the patient.  His diet is good.  He eats meat twice a week. He is on oral iron.  Symptomatically, his energy level is good.  He is active.  He has had trouble swallowing for 1 month.  Exam is unremarkable.  Ferritin is 43.  Plan: 1.  Labs today:  CBC with diff, ferritin, iron studies. 2.  B12 today and monthly. 3.  Follow-up with Dr. Mechele Collin re: upper endoscopy. 4.  RTC in 3 months for MD assessment and labs (CBC with diff, CMP, ferritin), and B12.   Rosey Bath, MD  02/17/2015, 11:09 AM

## 2015-03-12 ENCOUNTER — Other Ambulatory Visit: Payer: Self-pay | Admitting: Internal Medicine

## 2015-03-12 DIAGNOSIS — R1312 Dysphagia, oropharyngeal phase: Secondary | ICD-10-CM

## 2015-03-16 ENCOUNTER — Ambulatory Visit
Admission: RE | Admit: 2015-03-16 | Discharge: 2015-03-16 | Disposition: A | Discharge status: Home / Self Care | Payer: Medicare PPO | Source: Ambulatory Visit | Attending: Internal Medicine | Admitting: Internal Medicine

## 2015-03-16 DIAGNOSIS — R1312 Dysphagia, oropharyngeal phase: Secondary | ICD-10-CM

## 2015-03-16 DIAGNOSIS — R1319 Other dysphagia: Secondary | ICD-10-CM | POA: Diagnosis not present

## 2015-03-16 NOTE — Therapy (Signed)
[facility] DIAGNOSTIC RADIOLOGY [address] [place], [place], [address] Phone: [phone]   Fax:     Modified Barium Swallow  Patient Details  Name: Jeremy Herrera MRN: [id number] Date of Birth: 1940-09-28 No Data Recorded  Encounter Date: 03/16/2015      End of Session - 03/16/15 1542    Visit Number 1   Number of Visits 1   Date for SLP Re-Evaluation 03/16/15   SLP Start Time 1230   SLP Stop Time  1330   SLP Time Calculation (min) 60 min   Activity Tolerance Patient tolerated treatment well      Past Medical History  Diagnosis Date  . GERD (gastroesophageal reflux disease)   . Anemia   . HTN (hypertension)   . Irregular heart beat   . HLD (hyperlipidemia)   . BPH (benign prostatic hyperplasia)   . Nocturia   . Urinary retention     Past Surgical History  Procedure Laterality Date  . Hip surgery Right 2014  . Bladder patching  1964  . Transurethral resection of prostate      There were no vitals filed for this visit.  Visit Diagnosis: Oropharyngeal dysphagia - Plan: DG OP Swallowing Func-Medicare/Speech Path, DG OP Swallowing Func-Medicare/Speech Path     Subjective: Patient behavior: (alertness, ability to follow instructions, etc.): The patient is able to relate his swallowing history and explain his complaints.  Chief complaint: Continual throat clear associated with and without swallowing.  Describes laryngeal closure consistent with laryngospasm.  Patient denies history of stroke or other neurological diagnosis (to see neurology next month).  Patient denies history of bronchitis or pneumonia.    Objective:  Radiological Procedure: A videoflouroscopic evaluation of oral-preparatory, reflex initiation, and pharyngeal phases of the swallow was performed; as well as a screening of the upper esophageal phase.  I. POSTURE: Upright in MBS chair  II. VIEW: Lateral  III. COMPENSATORY STRATEGIES: Second swallow-  aids clearing but does not completely clear pharyngeal residue.  IV. BOLUSES ADMINISTERED:    Thin Liquid: 3 small cup rim sips   Nectar-thick Liquid: 2 medium size boluses    Puree: 3 teaspoon size boluses   Mechanical Soft: 1/4 crumbled graham cracker in applesauce  V. RESULTS OF EVALUATION: A. ORAL PREPARATORY PHASE: (The lips, tongue, and velum are observed for strength and coordination) Pre-swallow loss over the base of the tongue       **Overall Severity Rating: Mild  B. SWALLOW INITIATION/REFLEX: (The reflex is normal if "triggered" by the time the bolus reached the base of the tongue) Triggers while falling from the valleculae to the pyriform sinuses with nectar-thick liquid and at the pyriform sinuses with thin liquid  **Overall Severity Rating: Moderate  C. PHARYNGEAL PHASE: (Pharyngeal function is normal if the bolus shows rapid, smooth, and continuous transit through the pharynx and there is no pharyngeal residue after the swallow) decreased hyolaryngeal movement, decreased tongue base retraction.  Mild-to-moderate residue throughout the pharynx.    **Overall Severity Rating: Mild-moderate  D. LARYNGEAL PENETRATION: (Material entering into the laryngeal inlet/vestibule but not aspirated) With thin liquid, during the swallow without aspiration  E. ASPIRATION: Thin liquid; before the swallow due to a small portion falling into pharynx (and into airway) before the bulk of the bolus   F. ESOPHAGEAL PHASE: (Screening of the upper esophagus) No observed abnormalities within the viewable cervical esophagus  ASSESSMENT: This 75 year old man; with history of chronic throat clearing; is presenting with moderate  oropharyngeal dysphagia characterized by pre-swallow loss of solid and liquid, delayed pharyngeal swallow initiation, decreased tongue base retraction, and decreased pharyngeal pressure generation.   There is no observed laryngeal penetration or aspiration of solid or  nectar-thick liquid.  There is moderate pharyngeal residue throughout the pharynx.  A dry swallow does not complete clear residue.  With small boluses of thin liquid there is laryngeal penetration without aspiration.  There was one episode of frank aspiration of thin liquid, occurring before the swallow due to pre-swallow escape of a portion before the bulk of the bolus reached the base of the tongue.  The patient cleared his throat, but did not cough.  This study is consistent with neuromuscular impairment as well as the effects of laryngopharyngeal reflux (inflammation, edema, and resultant decreased sensation of the larynx and pharynx).  Recommend consult with ENT.  The patient was provided with written and verbal education regarding swallowing recommendations with emphasis on small bites and sips and alternating liquids and solids.  The patient was given written and verbal teaching regarding laryngeal/pharyngeal strengthening exercises.  These exercises will be effective to counteract neuromuscular impairment, but will not be particularly effective for laryngeal inflammation, edema, and decreased sensation.  The patient may benefit from speech therapy after consult with ENT and visualization of pharynx/larynx.  PLAN/RECOMMENDATIONS:   A. Diet:Regular (soft and moist)   B. Swallowing Precautions: Small bites and sips; swallow twice; alternate liquid and solids; stringent oral care.  Swallowing exercises.   C. Recommended consultation to ENT to assess for laryngopharyngeal reflux, laryngospasm, laryngeal sensory neuropathy   D. Therapy recommendations: after consult with ENT   E. Results and recommendations were discussed with the patient immediately following the study and the final report will be routed to the referring MD.         G-Codes - 04-07-15 1542    Functional Assessment Tool Used MBS, clinical judgment   Functional Limitations Swallowing   Swallow Current Status (Z6109) At least  40 percent but less than 60 percent impaired, limited or restricted   Swallow Goal Status (U0454) At least 40 percent but less than 60 percent impaired, limited or restricted   Swallow Discharge Status [phone]) At least 40 percent but less than 60 percent impaired, limited or restricted          Problem List Patient Active Problem List   Diagnosis Date Noted  . Nocturia 10/17/2014  . History of urinary retention 10/17/2014  . 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  . B12 deficiency 09/30/2014  . Right leg swelling 09/30/2014  . Anemia 09/16/2014  . Thrombocytopenia (HCC) 09/16/2014  . Benign essential HTN 06/08/2014  . 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 Beat, premature ventricular 12/03/2013  . Heart valve disease 12/03/2013  . 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 Diabetes mellitus, type 2 (HCC) 11/20/2013  . Acid reflux 11/20/2013  . Anemia, iron deficiency 11/20/2013  . Abnormal presence of protein in urine 11/20/2013  . 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

## 2015-03-17 ENCOUNTER — Inpatient Hospital Stay: Payer: Medicare PPO | Attending: Hematology and Oncology

## 2015-03-17 VITALS — BP 172/88 | HR 81 | Temp 96.8°F | Resp 18

## 2015-03-17 DIAGNOSIS — E538 Deficiency of other specified B group vitamins: Secondary | ICD-10-CM

## 2015-03-17 DIAGNOSIS — Z79899 Other long term (current) drug therapy: Secondary | ICD-10-CM | POA: Insufficient documentation

## 2015-03-17 MED ORDER — CYANOCOBALAMIN 1000 MCG/ML IJ SOLN
1000.0000 ug | Freq: Once | INTRAMUSCULAR | Status: AC
Start: 2015-03-17 — End: 2015-03-17
  Administered 2015-03-17: 1000 ug via INTRAMUSCULAR
  Filled 2015-03-17: qty 1

## 2015-03-23 DIAGNOSIS — Z96641 Presence of right artificial hip joint: Secondary | ICD-10-CM | POA: Insufficient documentation

## 2015-04-01 ENCOUNTER — Encounter: Payer: Self-pay | Admitting: Hematology and Oncology

## 2015-04-09 ENCOUNTER — Other Ambulatory Visit: Payer: Self-pay | Admitting: Gynecology

## 2015-04-09 ENCOUNTER — Other Ambulatory Visit: Payer: Self-pay | Admitting: Nurse Practitioner

## 2015-04-09 DIAGNOSIS — R1319 Other dysphagia: Secondary | ICD-10-CM

## 2015-04-13 ENCOUNTER — Ambulatory Visit: Payer: Medicare PPO

## 2015-04-14 ENCOUNTER — Inpatient Hospital Stay: Payer: Medicare PPO

## 2015-04-19 ENCOUNTER — Ambulatory Visit: Admission: RE | Admit: 2015-04-19 | Payer: Medicare PPO | Source: Ambulatory Visit

## 2015-04-21 ENCOUNTER — Inpatient Hospital Stay: Payer: Medicare PPO | Attending: Hematology and Oncology

## 2015-04-21 VITALS — BP 133/82 | HR 89 | Temp 96.8°F

## 2015-04-21 DIAGNOSIS — E538 Deficiency of other specified B group vitamins: Secondary | ICD-10-CM | POA: Insufficient documentation

## 2015-04-21 DIAGNOSIS — Z79899 Other long term (current) drug therapy: Secondary | ICD-10-CM | POA: Diagnosis not present

## 2015-04-21 MED ORDER — CYANOCOBALAMIN 1000 MCG/ML IJ SOLN
1000.0000 ug | Freq: Once | INTRAMUSCULAR | Status: AC
  Administered 2015-04-21: 1000 ug via INTRAMUSCULAR
  Filled 2015-04-21: qty 1

## 2015-04-23 ENCOUNTER — Ambulatory Visit
Admission: RE | Admit: 2015-04-23 | Discharge: 2015-04-23 | Disposition: A | Discharge status: Home / Self Care | Payer: Medicare PPO | Source: Ambulatory Visit | Attending: Nurse Practitioner | Admitting: Nurse Practitioner

## 2015-04-23 DIAGNOSIS — R1319 Other dysphagia: Secondary | ICD-10-CM

## 2015-04-29 DIAGNOSIS — R413 Other amnesia: Secondary | ICD-10-CM | POA: Insufficient documentation

## 2015-05-19 ENCOUNTER — Other Ambulatory Visit: Payer: Medicare PPO

## 2015-05-19 ENCOUNTER — Ambulatory Visit: Payer: Medicare PPO | Admitting: Hematology and Oncology

## 2015-05-19 ENCOUNTER — Telehealth: Payer: Self-pay | Admitting: Hematology and Oncology

## 2015-05-19 ENCOUNTER — Ambulatory Visit: Payer: Medicare PPO

## 2015-05-19 NOTE — Telephone Encounter (Signed)
Patient called, extremely out of breath, to cancel today's appointment. He could barely talk because he was having such a hard time trying to breathe. I expressed concern and said I would like to put the nurse on the phone with his permission due to his obvious breathing difficulty. Patient declined and said he didn't need to talk to the nurse because his daughter was going to be there in just a minute. I asked him to please go to the ER if his breathing gets worse. Wanted to let you know.

## 2015-05-19 NOTE — Telephone Encounter (Signed)
  Please check on patient.  Thanks,  M

## 2015-05-19 NOTE — Telephone Encounter (Signed)
Called pt per MD to check in pt.  Pt reports he took his fluid pill and feels better after I told him I thought he should go to the ER or call 911.  Pt stated "no" he is better since he took the pill.  Pt stated Thank you for calling and checking in on him.  I asked him to promise to call or go if he continues and he said ok.

## 2015-05-20 ENCOUNTER — Emergency Department
Admission: EM | Admit: 2015-05-20 | Discharge: 2015-05-21 | Disposition: A | Discharge status: Home / Self Care | Payer: Medicare PPO | Attending: Emergency Medicine | Admitting: Emergency Medicine

## 2015-05-20 ENCOUNTER — Encounter: Payer: Self-pay | Admitting: Gynecology

## 2015-05-20 ENCOUNTER — Emergency Department: Payer: Medicare PPO

## 2015-05-20 ENCOUNTER — Ambulatory Visit
Admission: EM | Admit: 2015-05-20 | Discharge: 2015-05-20 | Disposition: A | Discharge status: Other Acute Inpatient Hospital | Payer: Medicare PPO | Attending: Family Medicine | Admitting: Family Medicine

## 2015-05-20 DIAGNOSIS — K224 Dyskinesia of esophagus: Secondary | ICD-10-CM | POA: Diagnosis not present

## 2015-05-20 DIAGNOSIS — I10 Essential (primary) hypertension: Secondary | ICD-10-CM | POA: Diagnosis not present

## 2015-05-20 DIAGNOSIS — J189 Pneumonia, unspecified organism: Secondary | ICD-10-CM | POA: Insufficient documentation

## 2015-05-20 DIAGNOSIS — Z7982 Long term (current) use of aspirin: Secondary | ICD-10-CM | POA: Insufficient documentation

## 2015-05-20 DIAGNOSIS — E1140 Type 2 diabetes mellitus with diabetic neuropathy, unspecified: Secondary | ICD-10-CM | POA: Insufficient documentation

## 2015-05-20 DIAGNOSIS — Z79899 Other long term (current) drug therapy: Secondary | ICD-10-CM | POA: Insufficient documentation

## 2015-05-20 DIAGNOSIS — R0600 Dyspnea, unspecified: Secondary | ICD-10-CM | POA: Insufficient documentation

## 2015-05-20 DIAGNOSIS — R079 Chest pain, unspecified: Secondary | ICD-10-CM | POA: Insufficient documentation

## 2015-05-20 DIAGNOSIS — I498 Other specified cardiac arrhythmias: Secondary | ICD-10-CM

## 2015-05-20 DIAGNOSIS — Z885 Allergy status to narcotic agent status: Secondary | ICD-10-CM | POA: Diagnosis not present

## 2015-05-20 DIAGNOSIS — Z9889 Other specified postprocedural states: Secondary | ICD-10-CM | POA: Diagnosis not present

## 2015-05-20 DIAGNOSIS — I499 Cardiac arrhythmia, unspecified: Secondary | ICD-10-CM | POA: Diagnosis not present

## 2015-05-20 DIAGNOSIS — I509 Heart failure, unspecified: Secondary | ICD-10-CM | POA: Insufficient documentation

## 2015-05-20 DIAGNOSIS — E785 Hyperlipidemia, unspecified: Secondary | ICD-10-CM | POA: Insufficient documentation

## 2015-05-20 DIAGNOSIS — K219 Gastro-esophageal reflux disease without esophagitis: Secondary | ICD-10-CM | POA: Diagnosis not present

## 2015-05-20 DIAGNOSIS — N400 Enlarged prostate without lower urinary tract symptoms: Secondary | ICD-10-CM | POA: Insufficient documentation

## 2015-05-20 DIAGNOSIS — R0602 Shortness of breath: Secondary | ICD-10-CM | POA: Diagnosis present

## 2015-05-20 DIAGNOSIS — I110 Hypertensive heart disease with heart failure: Secondary | ICD-10-CM | POA: Diagnosis not present

## 2015-05-20 HISTORY — DX: Dysphagia, unspecified: R13.10

## 2015-05-20 HISTORY — DX: Heart failure, unspecified: I50.9

## 2015-05-20 LAB — COMPREHENSIVE METABOLIC PANEL
ALK PHOS: 151 U/L — AB (ref 38–126)
ALT: 14 U/L — AB (ref 17–63)
ANION GAP: 4 — AB (ref 5–15)
AST: 18 U/L (ref 15–41)
Albumin: 3.9 g/dL (ref 3.5–5.0)
BUN: 21 mg/dL — ABNORMAL HIGH (ref 6–20)
CALCIUM: 8.5 mg/dL — AB (ref 8.9–10.3)
CHLORIDE: 108 mmol/L (ref 101–111)
CO2: 27 mmol/L (ref 22–32)
Creatinine, Ser: 0.96 mg/dL (ref 0.61–1.24)
GFR calc non Af Amer: >60 mL/min (ref >60)
GLUCOSE: 108 mg/dL — AB (ref 65–99)
POTASSIUM: 4.2 mmol/L (ref 3.5–5.1)
Sodium: 139 mmol/L (ref 135–145)
Total Bilirubin: 0.5 mg/dL (ref 0.3–1.2)
Total Protein: 6.7 g/dL (ref 6.5–8.1)

## 2015-05-20 LAB — CBC
HEMATOCRIT: 39.2 % — AB (ref 40.0–52.0)
HEMOGLOBIN: 13.4 g/dL (ref 13.0–18.0)
MCH: 31.4 pg (ref 26.0–34.0)
MCHC: 34.1 g/dL (ref 32.0–36.0)
MCV: 92.3 fL (ref 80.0–100.0)
Platelets: 133 10*3/uL — ABNORMAL LOW (ref 150–440)
RBC: 4.25 MIL/uL — AB (ref 4.40–5.90)
RDW: 13.9 % (ref 11.5–14.5)
WBC: 5.5 10*3/uL (ref 3.8–10.6)

## 2015-05-20 LAB — TROPONIN I: Troponin I: <0.03 ng/mL (ref <0.031)

## 2015-05-20 LAB — BRAIN NATRIURETIC PEPTIDE: B NATRIURETIC PEPTIDE 5: 43 pg/mL (ref 0.0–100.0)

## 2015-05-20 MED ORDER — AZITHROMYCIN 500 MG PO TABS
500.0000 mg | ORAL_TABLET | Freq: Once | ORAL | Status: AC
  Administered 2015-05-20: 500 mg via ORAL
  Filled 2015-05-20: qty 1

## 2015-05-20 MED ORDER — AZITHROMYCIN 500 MG PO TABS: 500.0000 mg | ORAL_TABLET | Freq: Every day | ORAL | Status: AC

## 2015-05-20 MED ORDER — IOPAMIDOL (ISOVUE-370) INJECTION 76%
100.0000 mL | Freq: Once | INTRAVENOUS | Status: AC | PRN
  Administered 2015-05-20: 100 mL via INTRAVENOUS

## 2015-05-20 NOTE — ED Notes (Signed)
Pt arrives [facility] ER via [facility] with c/o SOB and chest pain. Pt states that he has been having SOB and trouble swallowing for 4 months. Pt pants with exertion but continues to talk. Color WNL. RR even at this time. Pt alert and oriented X4 at time of arrival. 18G to L AC started by EMS. EKG WNL at [facility].

## 2015-05-20 NOTE — Discharge Instructions (Signed)

## 2015-05-20 NOTE — ED Provider Notes (Signed)
[facility] Emergency Department Provider Note  ____________________________________________    I have reviewed the triage vital signs and the nursing notes.   HISTORY  Chief Complaint Shortness of Breath and Chest Pain    HPI Jeremy Herrera is a 75 y.o. male who was sent to the emergency department from an urgent care with complaints of mild shortness of breath and chest discomfort. Patient tells me that he is no longer having chest discomfort. He does report a history of difficulty swallowing/esophageal dysfunction which has been ongoing. He has follow-up with ENT. He denies fevers chills. Mild cough. No recent travel. No calf pain or swelling.     Past Medical History  Diagnosis Date  . GERD (gastroesophageal reflux disease)   . Anemia   . HTN (hypertension)   . Irregular heart beat   . HLD (hyperlipidemia)   . BPH (benign prostatic hyperplasia)   . Nocturia   . Urinary retention   . Dysphagia   . CHF (congestive heart failure) [facility])     Patient Active Problem List   Diagnosis Date Noted  . Nocturia 10/17/2014  . History of urinary retention 10/17/2014  . 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  . B12 deficiency 09/30/2014  . Right leg swelling 09/30/2014  . Anemia 09/16/2014  . Thrombocytopenia (HCC) 09/16/2014  . Benign essential HTN 06/08/2014  . 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 Beat, premature ventricular 12/03/2013  . Heart valve disease 12/03/2013  . 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 Diabetes mellitus, type 2 (HCC) 11/20/2013  . Acid reflux 11/20/2013  . Anemia, iron deficiency 11/20/2013  . Abnormal presence of protein in urine 11/20/2013  . 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 Hip surgery Right 2014  . Bladder patching  1964  . Transurethral resection of prostate       Current Outpatient Rx  Name  Route  Sig  Dispense  Refill  . acetaminophen (TYLENOL) 500 MG tablet   Oral   Take by mouth.         Marland Kitchen aspirin 81 MG tablet   Oral   Take 81 mg by mouth daily.         . bimatoprost (LUMIGAN) 0.01 % SOLN   Ophthalmic   Apply to eye.         . dorzolamide-timolol (COSOPT) 22.3-6.8 MG/ML ophthalmic solution   Ophthalmic   Apply to eye.         . dutasteride (AVODART) 0.5 MG capsule   Oral   Take 1 capsule (0.5 mg total) by mouth daily.   30 capsule   12   . esomeprazole (NEXIUM) 20 MG capsule   Oral   Take 20 mg by mouth daily at 12 noon.         . ferrous sulfate 325 (65 FE) MG tablet   Oral   Take 325 mg by mouth daily with breakfast.         . furosemide (LASIX) 20 MG tablet   Oral   Take by mouth.         . Naproxen Sodium (ALEVE) 220 MG CAPS   Oral   Take 1 capsule by mouth as needed.         . tadalafil (CIALIS) 5 MG tablet   Oral   Take 5 mg by mouth daily.          Marland Kitchen  vitamin B-12 (CYANOCOBALAMIN) 1000 MCG tablet   Oral   Take by mouth every 30 (thirty) days. Injection           Allergies Hydrocodone-acetaminophen  Family History  Problem Relation Age of Onset  . Ulcers Mother     stomach  . GER disease Sister     GERD  . Heart attack Father   . Diabetes Brother   . Depression Brother   . Hiatal hernia Mother     Social History Social History  Substance Use Topics  . Smoking status: Never Smoker   . Smokeless tobacco: Never Used  . Alcohol Use: No    Review of Systems  Constitutional: Negative for fever. Eyes: Negative for redness ENT: Negative for sore throat Cardiovascular: Negative for chest pain Respiratory: As above Gastrointestinal: Negative for abdominal pain Genitourinary: Negative for dysuria. Musculoskeletal: Negative for back pain. Skin: Negative for rash. Neurological: Negative for focal weakness Psychiatric: no  anxiety    ____________________________________________   PHYSICAL EXAM:  VITAL SIGNS: ED Triage Vitals  Enc Vitals Group     BP 05/20/15 1852 176/84 mmHg     Pulse Rate 05/20/15 1852 85     Resp 05/20/15 1852 24     Temp 05/20/15 1852 98 F (36.7 C)     Temp Source 05/20/15 1852 Oral     SpO2 05/20/15 1852 95 %     Weight 05/20/15 1852 198 lb (89.812 kg)     Height 05/20/15 1852 6\' 2"  (1.88 m)     Head Cir --      Peak Flow --      Pain Score 05/20/15 1901 1     Pain Loc --      Pain Edu? --      Excl. in GC? --      Constitutional: Alert and oriented. Well appearing and in no distress.  Eyes: Conjunctivae are normal. No erythema or injection ENT   Head: Normocephalic and atraumatic.   Mouth/Throat: Mucous membranes are moist. Cardiovascular: Normal rate, regular rhythm. Normal and symmetric distal pulses are present in the upper extremities. Respiratory: Normal respiratory effort without tachypnea nor retractions. Breath sounds are clear and equal bilaterally.  Gastrointestinal: Soft and non-tender in all quadrants. No distention. There is no CVA tenderness. Genitourinary: deferred Musculoskeletal: Nontender with normal range of motion in all extremities. No lower extremity tenderness nor edema. Neurologic:  Normal speech and language. No gross focal neurologic deficits are appreciated. Skin:  Skin is warm, dry and intact. No rash noted. Psychiatric: Mood and affect are normal. Patient exhibits appropriate insight and judgment.  ____________________________________________    LABS (pertinent positives/negatives)  Labs Reviewed  CBC - Abnormal; Notable for the following:    RBC 4.25 (*)    HCT 39.2 (*)    Platelets 133 (*)    All other components within normal limits  COMPREHENSIVE METABOLIC PANEL - Abnormal; Notable for the following:    Glucose, Bld 108 (*)    BUN 21 (*)    Calcium 8.5 (*)    ALT 14 (*)    Alkaline Phosphatase 151 (*)    Anion gap  4 (*)    All other components within normal limits  TROPONIN I  BRAIN NATRIURETIC PEPTIDE  TROPONIN I    ____________________________________________   EKG  ED ECG REPORT I, Jene EveryKINNER, Cynda Soule, the attending physician, personally viewed and interpreted this ECG.  Date: 05/20/2015 EKG Time: 9:37 PM Rate: 74 Rhythm: normal sinus rhythm QRS Axis:  normal Intervals: normal ST/T Wave abnormalities: normal Conduction Disturbances: none Narrative Interpretation: unremarkable   ____________________________________________    RADIOLOGY  CT angio chest shows small focus of airspace opacity, discussed this with the patient and need for follow-up CT  ____________________________________________   PROCEDURES  Procedure(s) performed: none  Critical Care performed: none  ____________________________________________   INITIAL IMPRESSION / ASSESSMENT AND PLAN / ED COURSE  Pertinent labs & imaging results that were available during my care of the patient were reviewed by me and considered in my medical decision making (see chart for details).  Patient overall well-appearing and in no distress. Vital signs are reassuring. Blood work is unremarkable. Chest x-ray unremarkable. Normal lung exam. CT angiography shows possible small airspace opacity. We'll check a second troponin and if normal discharge the patient with antibiotics and PCP follow-up. Patient and family agree with this plan. Return precautions discussed at length.  Patient observed in ED at length  ____________________________________________   FINAL CLINICAL IMPRESSION(S) / ED DIAGNOSES  Final diagnoses:  Community acquired pneumonia          Jene Every, MD 05/20/15 2335

## 2015-05-20 NOTE — ED Notes (Signed)
Pt. C/o shortness of breath x 2 days.. Pt. Also stated chest discomfort.

## 2015-05-20 NOTE — Discharge Instructions (Signed)
Chest Wall Pain Chest wall pain is pain in or around the bones and muscles of your chest. Sometimes, an injury causes this pain. Sometimes, the cause may not be known. This pain may take several weeks or longer to get better. HOME CARE INSTRUCTIONS  Pay attention to any changes in your symptoms. Take these actions to help with your pain:   Rest as told by your health care provider.   Avoid activities that cause pain. These include any activities that use your chest muscles or your abdominal and side muscles to lift heavy items.   If directed, apply ice to the painful area:  Put ice in a plastic bag.  Place a towel between your skin and the bag.  Leave the ice on for 20 minutes, 2-3 times per day.  Take over-the-counter and prescription medicines only as told by your health care provider.  Do not use tobacco products, including cigarettes, chewing tobacco, and e-cigarettes. If you need help quitting, ask your health care provider.  Keep all follow-up visits as told by your health care provider. This is important. SEEK MEDICAL CARE IF:  You have a fever.  Your chest pain becomes worse.  You have new symptoms. SEEK IMMEDIATE MEDICAL CARE IF:  You have nausea or vomiting.  You feel sweaty or light-headed.  You have a cough with phlegm (sputum) or you cough up blood.  You develop shortness of breath.   This information is not intended to replace advice given to you by your health care provider. Make sure you discuss any questions you have with your health care provider.   Document Released: 02/06/2005 Document Revised: 10/28/2014 Document Reviewed: 05/04/2014 Elsevier Interactive Patient Education 2016 [employer] Inc[employer]   Nonspecific Chest Pain It is often hard to find the cause of chest pain. There is always a chance that your pain could be related to something serious, such as a heart attack or a blood clot in your lungs. Chest pain can also be caused by conditions that are  not life-threatening. If you have chest pain, it is very important to follow up with your doctor.  HOME CARE  If you were prescribed an antibiotic medicine, finish it all even if you start to feel better.  Avoid any activities that cause chest pain.  Do not use any tobacco products, including cigarettes, chewing tobacco, or electronic cigarettes. If you need help quitting, ask your doctor.  Do not drink alcohol.  Take medicines only as told by your doctor.  Keep all follow-up visits as told by your doctor. This is important. This includes any further testing if your chest pain does not go away.  Your doctor may tell you to keep your head raised (elevated) while you sleep.  Make lifestyle changes as told by your doctor. These may include:  Getting regular exercise. Ask your doctor to suggest some activities that are safe for you.  Eating a heart-healthy diet. Your doctor or a diet specialist (dietitian) can help you to learn healthy eating options.  Maintaining a healthy weight.  Managing diabetes, if necessary.  Reducing stress. GET HELP IF:  Your chest pain does not go away, even after treatment.  You have a rash with blisters on your chest.  You have a fever. GET HELP RIGHT AWAY IF:  Your chest pain is worse.  You have an increasing cough, or you cough up blood.  You have severe belly (abdominal) pain.  You feel extremely weak.  You pass out (faint).  You  have chills.  You have sudden, unexplained chest discomfort.  You have sudden, unexplained discomfort in your arms, back, neck, or jaw.  You have shortness of breath at any time.  You suddenly start to sweat, or your skin gets clammy.  You feel nauseous.  You vomit.  You suddenly feel light-headed or dizzy.  Your heart begins to beat quickly, or it feels like it is skipping beats. These symptoms may be an emergency. Do not wait to see if the symptoms will go away. Get medical help right away. Call  your local emergency services (911 in the [place] Do not drive yourself to the hospital.   This information is not intended to replace advice given to you by your health care provider. Make sure you discuss any questions you have with your health care provider.   Document Released: 07/26/2007 Document Revised: 02/27/2014 Document Reviewed: 09/12/2013 Elsevier Interactive Patient Education 2016 [employer] Inc[employer]  Premature Ventricular Contraction A premature ventricular contraction is an irregularity in the normal heart rhythm. These contractions are extra heartbeats that occur too early in the normal sequence. In most cases, these contractions are harmless and do not require treatment. CAUSES Premature ventricular contractions may occur without a known cause. In healthy people, the extra contractions may be caused by:  Smoking.  Drinking alcohol.  Caffeine.  Certain medicines.  Some illegal drugs.  Stress. Sometimes, changes in chemicals in the blood (electrolytes) can also cause premature ventricular contractions. They can also occur in people with heart diseases that cause a decrease in blood flow to the heart. SIGNS AND SYMPTOMS Premature ventricular contractions often do not cause any symptoms. In some cases, you may have a feeling of your heart beating fast or skipping a beat (palpitations). DIAGNOSIS Your health care provider will take your medical history and do a physical exam. During the exam, the health care provider will check for irregular heartbeats. Various tests may be done to help diagnose premature ventricular contractions. These tests may include:  An ECG (electrocardiogram) to monitor the electrical activity of your heart.  Holter monitor testing. A Holter monitor is a portable device that can monitor the electrical activity of your heart over longer periods of time.  Stress tests to see how exercise affects your heart rhythm.  Echocardiogram. This test uses sound  waves (ultrasound) to produce an image of your heart.  Electrophysiology study. This is used to evaluate the electrical conduction system of your heart. TREATMENT Usually, no treatment is needed. You may be advised to avoid things that can trigger the premature contractions, such as caffeine or alcohol. Medicines are sometimes given if symptoms are severe or if the extra heartbeats are very frequent. Treatment may also be needed for an underlying cause of the contractions if one is found. HOME CARE INSTRUCTIONS  Take medicines only as directed by your health care provider.  Make any lifestyle changes recommended by your health care provider. These may include:  Quitting smoking.  Avoiding or limiting caffeine or alcohol.  Exercising. Talk to your health care provider about what type of exercise is safe for you.  Trying to reduce stress.  Keep all follow-up visits with your health care provider. This is important. SEEK IMMEDIATE MEDICAL CARE IF:  You feel palpitations that are frequent or continual.  You have chest pain.  You have shortness of breath.  You have sweating for no reason.  You have nausea and vomiting.  You become light-headed or faint.   This information is not intended  to replace advice given to you by your health care provider. Make sure you discuss any questions you have with your health care provider.   Document Released: 09/24/2003 Document Revised: 02/27/2014 Document Reviewed: 07/10/2013 Elsevier Interactive Patient Education 2016 Else[employer] Inc.  Premature Atrial Contraction Premature atrial contractions (PACs) happen when your heart beats before it has had time to fill with blood. Your heart then has to pause until it can fill with blood for the next beat. This causes the next beat to be more forceful. PACs are also called skipped heartbeats because it may feel like your heart stops for a second.  Your heart has four chambers. There are two upper chambers  (atria) and two lower chambers (ventricles). All the chambers need to work together to pump blood properly. Electrical signals spread across your heart and make all the chambers beat together.The signal to beat starts in your atria. If the atria fire a bit early, you may have a PAC.  CAUSES  The cause of a PAC is often unknown. PACs are sometimes caused by heart disease or injury. RISK FACTORS PACs are more common in children and older people. Other risk factors that may trigger PACs include:  Caffeine.  Stress.  Fatigue.  Alcohol.  Smoking.  Stimulant drugs. These may be prescription or illegal drugs.  Heart disease. SIGNS AND SYMPTOMS PACs are very common, especially in children and people 50 years and older. PACs do not cause dizziness, shortness of breath, or chest pain. The only symptom of a PAC is the sensation of a skipped or fluttering heartbeat.  DIAGNOSIS  Your health care provider can diagnose PAC based on the description of your symptom. Your health care provider may also:  Perform a physical exam to listen to your heart. Your heart may sound normal during this exam.  Perform tests to rule out other conditions. These tests may include an electrical tracing of your heart called electrocardiogram (ECG). You may need to wear a portable ECG machine (Holter monitor) that records your heart for 24 hours or more. TREATMENT  In most cases, PACs do not need to be treated. If you have frequent PACs that are caused by heart disease, you may be treated for the underlying condition. HOME CARE INSTRUCTIONS  Do not use any tobacco products, including cigarettes, chewing tobacco, or electronic cigarettes. If you need help quitting, ask your health care provider.  Limit alcohol intake to no more than 1 drink per day for nonpregnant women and 2 drinks per day for men. One drink equals 12 ounces of beer, 5 ounces of wine, or 1 ounces of hard liquor.  Limit the amount of caffeine you  take in.  Do not use illegal drugs.  Get at least 8 hours of sleep every night.  Find healthy ways to manage stress.  Get regular exercise. Ask your health care provider to suggest some activities that are safe for you. SEEK MEDICAL CARE IF:  You feel your heart skipping beats often (more than once a day).  Your heart skips beats and you feel dizzy, lightheaded, or very tired. SEEK IMMEDIATE MEDICAL CARE IF:   You have chest pain.  You have trouble breathing.   This information is not intended to replace advice given to you by your health care provider. Make sure you discuss any questions you have with your health care provider.   Document Released: 10/10/2013 Document Revised: 06/23/2014 Document Reviewed: 10/10/2013 Elsevier Interactive Patient Education 2016 [employer].  Shortness of Breath  Shortness of breath means you have trouble breathing. Shortness of breath needs medical care right away. HOME CARE   Do not smoke.  Avoid being around chemicals or things (paint fumes, dust) that may bother your breathing.  Rest as needed. Slowly begin your normal activities.  Only take medicines as told by your doctor.  Keep all doctor visits as told. GET HELP RIGHT AWAY IF:   Your shortness of breath gets worse.  You feel lightheaded, pass out (faint), or have a cough that is not helped by medicine.  You cough up blood.  You have pain with breathing.  You have pain in your chest, arms, shoulders, or belly (abdomen).  You have a fever.  You cannot walk up stairs or exercise the way you normally do.  You do not get better in the time expected.  You have a hard time doing normal activities even with rest.  You have problems with your medicines.  You have any new symptoms. MAKE SURE YOU:  Understand these instructions.  Will watch your condition.  Will get help right away if you are not doing well or get worse.   This information is not intended to replace  advice given to you by your health care provider. Make sure you discuss any questions you have with your health care provider.   Document Released: 07/26/2007 Document Revised: 02/11/2013 Document Reviewed: 04/24/2011 Elsevier Interactive Patient Education [employer] Elsevier Inc.

## 2015-05-20 NOTE — ED Provider Notes (Addendum)
CSN: [id number]     Arrival date & time 05/20/15  1734 History   None  Nurses notes were reviewed.  Chief Complaint  Patient presents with  . Shortness of Breath  Patient is having shortness of breath. He states this started yesterday progressive gotten worse he has a history of esophageal dysfunction and difficulty swallowing his. Speech therapy but she is not apparently gone to ENT. There are several appointments with his cardiologist PCP and with GI as well. He reports having some chest pain 2 out of 10. Started got worse today. But the shortness of breath is gotten much worse. His daughter states that he'll eat something get short of breath but he still has some breath like this. He had difficulty talking and difficulty communicating and history is very sporadic. (Consider location/radiation/quality/duration/timing/severity/associated sxs/prior Treatment) Patient is a 75 y.o. male presenting with shortness of breath. The history is provided by the patient and a relative. No language interpreter was used.  Shortness of Breath Severity:  Moderate Onset quality:  Sudden Duration:  1 day Progression:  Waxing and waning Chronicity:  New Context: animal exposure   Relieved by:  Nothing Worsened by:  Nothing tried Ineffective treatments:  None tried Associated symptoms: chest pain, cough and diaphoresis   Associated symptoms: no fever   Risk factors: no family hx of DVT, no hx of cancer, no recent surgery and no tobacco use     Past Medical History  Diagnosis Date  . GERD (gastroesophageal reflux disease)   . Anemia   . HTN (hypertension)   . Irregular heart beat   . HLD (hyperlipidemia)   . BPH (benign prostatic hyperplasia)   . Nocturia   . Urinary retention   . Dysphagia    Past Surgical History  Procedure Laterality Date  . Hip surgery Right 2014  . Bladder patching  1964  . Transurethral resection of prostate     Family History  Problem Relation Age of Onset  . Ulcers  Mother     stomach  . GER disease Sister     GERD  . Heart attack Father   . Diabetes Brother   . Depression Brother   . Hiatal hernia Mother    Social History  Substance Use Topics  . Smoking status: Never Smoker   . Smokeless tobacco: Never Used  . Alcohol Use: No    Review of Systems  Unable to perform ROS: Severe respiratory distress  Constitutional: Positive for diaphoresis. Negative for fever.  Respiratory: Positive for cough and shortness of breath.   Cardiovascular: Positive for chest pain.  All other systems reviewed and are negative.   Allergies  Hydrocodone-acetaminophen  Home Medications   Prior to Admission medications   Medication Sig Start Date End Date Taking? Authorizing Provider  acetaminophen (TYLENOL) 500 MG tablet Take by mouth.   Yes Historical Provider, MD  aspirin 81 MG tablet Take 81 mg by mouth daily.   Yes Historical Provider, MD  bimatoprost (LUMIGAN) 0.01 % SOLN Apply to eye.   Yes Historical Provider, MD  dorzolamide-timolol (COSOPT) 22.3-6.8 MG/ML ophthalmic solution Apply to eye.   Yes Historical Provider, MD  dutasteride (AVODART) 0.5 MG capsule Take 1 capsule (0.5 mg total) by mouth daily. 10/08/14  Yes Shannon A McGowan, PA-C  esomeprazole (NEXIUM) 20 MG capsule Take 20 mg by mouth daily at 12 noon.   Yes Historical Provider, MD  ferrous sulfate 325 (65 FE) MG tablet Take 325 mg by mouth daily with breakfast.  Yes Historical Provider, MD  furosemide (LASIX) 20 MG tablet Take by mouth. 11/23/14  Yes Historical Provider, MD  Naproxen Sodium (ALEVE) 220 MG CAPS Take 1 capsule by mouth as needed.   Yes Historical Provider, MD  tadalafil (CIALIS) 5 MG tablet Take 5 mg by mouth daily.    Yes Historical Provider, MD  vitamin B-12 (CYANOCOBALAMIN) 1000 MCG tablet Take by mouth every 30 (thirty) days. Injection   Yes Historical Provider, MD   Meds Ordered and Administered this Visit  Medications - No data to display  BP 161/78 mmHg  Pulse 85   Temp(Src) 97.1 F (36.2 C) (Oral)  Resp 22  Ht 6\' 2"  (1.88 m)  SpO2 100% No data found.   Physical Exam  Constitutional: He is oriented to person, place, and time.  HENT:  Head: Normocephalic.  Right Ear: Hearing and tympanic membrane normal.  Left Ear: Hearing and tympanic membrane normal.  Nose: Nose normal.  Cardiovascular: An irregularly irregular rhythm present. Tachycardia present.   Heart rate is irregularly irregular and tachycardic  Pulmonary/Chest: He is in respiratory distress. He has no decreased breath sounds. He has rhonchi.  Musculoskeletal: Normal range of motion. He exhibits edema and tenderness.  Neurological: He is alert and oriented to person, place, and time.  Skin: Skin is warm.  Psychiatric: He has a normal mood and affect. His behavior is normal.  Vitals reviewed.   ED Course  Procedures (including critical care time)  Labs Review Labs Reviewed - No data to display  Imaging Review No results found.   Visual Acuity Review  Right Eye Distance:   Left Eye Distance:   Bilateral Distance:    Right Eye Near:   Left Eye Near:    Bilateral Near:         MDM   1. Acute dyspnea   2. Esophageal dysfunction   3. Other cardiac arrhythmia   4. Chest pain, unspecified chest pain type    Discussed with first responders and fire rescue of patient's symptoms and his shortness of breath. This time not sure whether this is coming from some type of aspiration problem congestive heart failure is arrhythmia coronary artery disease or or components of all four. Daughter wanted to drive him to the hospital  I strongly discouraged this and this discussed with charge nurse [facility] of patient's pending arrival by EMS. Should be noted that pulse ox here was 100% but he still started O2 and EKG showed a regular heart rhythm with PACs and PVCs..  ED ECG REPORT I, Naeemah Jasmer H, the attending physician, personally viewed and interpreted this ECG.   Date:  05/20/2015  EKG Time: 17:45::36    Rate: 83  Rhythm: there are no previous tracings available for comparison, PAC's noted, frequent PVC's noted  Axis:23  Intervals:irregular rate and rhythm  ST&T Change: none  Hassan RowanEugene Seng Fouts, MD 05/20/15 1825  Hassan RowanEugene Aroura Vasudevan, MD 05/20/15 [phone]

## 2015-05-20 NOTE — Telephone Encounter (Signed)
Thanks, --M

## 2015-05-24 ENCOUNTER — Other Ambulatory Visit: Payer: Self-pay | Admitting: Internal Medicine

## 2015-05-24 DIAGNOSIS — J189 Pneumonia, unspecified organism: Secondary | ICD-10-CM

## 2015-05-26 ENCOUNTER — Inpatient Hospital Stay: Payer: Medicare PPO | Attending: Hematology and Oncology

## 2015-05-26 ENCOUNTER — Inpatient Hospital Stay (HOSPITAL_BASED_OUTPATIENT_CLINIC_OR_DEPARTMENT_OTHER): Payer: Medicare PPO | Admitting: Hematology and Oncology

## 2015-05-26 ENCOUNTER — Inpatient Hospital Stay: Payer: Medicare PPO

## 2015-05-26 VITALS — BP 139/77 | HR 89 | Temp 96.6°F | Resp 20 | Ht 74.0 in | Wt 216.8 lb

## 2015-05-26 DIAGNOSIS — Z8701 Personal history of pneumonia (recurrent): Secondary | ICD-10-CM | POA: Diagnosis not present

## 2015-05-26 DIAGNOSIS — D649 Anemia, unspecified: Secondary | ICD-10-CM

## 2015-05-26 DIAGNOSIS — E538 Deficiency of other specified B group vitamins: Secondary | ICD-10-CM | POA: Diagnosis not present

## 2015-05-26 DIAGNOSIS — L97529 Non-pressure chronic ulcer of other part of left foot with unspecified severity: Secondary | ICD-10-CM

## 2015-05-26 DIAGNOSIS — I10 Essential (primary) hypertension: Secondary | ICD-10-CM | POA: Diagnosis not present

## 2015-05-26 DIAGNOSIS — E785 Hyperlipidemia, unspecified: Secondary | ICD-10-CM

## 2015-05-26 DIAGNOSIS — R339 Retention of urine, unspecified: Secondary | ICD-10-CM | POA: Insufficient documentation

## 2015-05-26 DIAGNOSIS — K219 Gastro-esophageal reflux disease without esophagitis: Secondary | ICD-10-CM

## 2015-05-26 DIAGNOSIS — R748 Abnormal levels of other serum enzymes: Secondary | ICD-10-CM

## 2015-05-26 DIAGNOSIS — I509 Heart failure, unspecified: Secondary | ICD-10-CM | POA: Insufficient documentation

## 2015-05-26 DIAGNOSIS — Z862 Personal history of diseases of the blood and blood-forming organs and certain disorders involving the immune mechanism: Secondary | ICD-10-CM | POA: Diagnosis not present

## 2015-05-26 DIAGNOSIS — N400 Enlarged prostate without lower urinary tract symptoms: Secondary | ICD-10-CM | POA: Insufficient documentation

## 2015-05-26 DIAGNOSIS — R918 Other nonspecific abnormal finding of lung field: Secondary | ICD-10-CM | POA: Insufficient documentation

## 2015-05-26 DIAGNOSIS — R1310 Dysphagia, unspecified: Secondary | ICD-10-CM | POA: Diagnosis not present

## 2015-05-26 DIAGNOSIS — Z79899 Other long term (current) drug therapy: Secondary | ICD-10-CM

## 2015-05-26 DIAGNOSIS — D696 Thrombocytopenia, unspecified: Secondary | ICD-10-CM | POA: Insufficient documentation

## 2015-05-26 DIAGNOSIS — R351 Nocturia: Secondary | ICD-10-CM | POA: Insufficient documentation

## 2015-05-26 DIAGNOSIS — Z7982 Long term (current) use of aspirin: Secondary | ICD-10-CM | POA: Diagnosis not present

## 2015-05-26 LAB — COMPREHENSIVE METABOLIC PANEL
ALT: 12 U/L — ABNORMAL LOW (ref 17–63)
AST: 16 U/L (ref 15–41)
Albumin: 3.9 g/dL (ref 3.5–5.0)
Alkaline Phosphatase: 151 U/L — ABNORMAL HIGH (ref 38–126)
Anion gap: 8 (ref 5–15)
BUN: 26 mg/dL — ABNORMAL HIGH (ref 6–20)
CO2: 22 mmol/L (ref 22–32)
Calcium: 9 mg/dL (ref 8.9–10.3)
Chloride: 113 mmol/L — ABNORMAL HIGH (ref 101–111)
Creatinine, Ser: 1.02 mg/dL (ref 0.61–1.24)
GFR calc Af Amer: >60 mL/min (ref >60)
GFR calc non Af Amer: >60 mL/min (ref >60)
Glucose, Bld: 108 mg/dL — ABNORMAL HIGH (ref 65–99)
Potassium: 4.3 mmol/L (ref 3.5–5.1)
Sodium: 143 mmol/L (ref 135–145)
Total Bilirubin: 0.8 mg/dL (ref 0.3–1.2)
Total Protein: 6.4 g/dL — ABNORMAL LOW (ref 6.5–8.1)

## 2015-05-26 LAB — CBC WITH DIFFERENTIAL/PLATELET
Basophils Absolute: 0.1 10*3/uL (ref 0–0.1)
Basophils Relative: 1 %
Eosinophils Absolute: 0.1 10*3/uL (ref 0–0.7)
Eosinophils Relative: 2 %
HCT: 38.5 % — ABNORMAL LOW (ref 40.0–52.0)
Hemoglobin: 12.8 g/dL — ABNORMAL LOW (ref 13.0–18.0)
Lymphocytes Relative: 26 %
Lymphs Abs: 1.8 10*3/uL (ref 1.0–3.6)
MCH: 30.5 pg (ref 26.0–34.0)
MCHC: 33.3 g/dL (ref 32.0–36.0)
MCV: 91.5 fL (ref 80.0–100.0)
Monocytes Absolute: 0.4 10*3/uL (ref 0.2–1.0)
Monocytes Relative: 6 %
Neutro Abs: 4.5 10*3/uL (ref 1.4–6.5)
Neutrophils Relative %: 65 %
Platelets: 123 10*3/uL — ABNORMAL LOW (ref 150–440)
RBC: 4.21 MIL/uL — ABNORMAL LOW (ref 4.40–5.90)
RDW: 14.1 % (ref 11.5–14.5)
WBC: 6.9 10*3/uL (ref 3.8–10.6)

## 2015-05-26 LAB — FERRITIN: Ferritin: 52 ng/mL (ref 24–336)

## 2015-05-26 MED ORDER — CYANOCOBALAMIN 1000 MCG/ML IJ SOLN
1000.0000 ug | Freq: Once | INTRAMUSCULAR | Status: AC
  Administered 2015-05-26: 1000 ug via INTRAMUSCULAR
  Filled 2015-05-26: qty 1

## 2015-05-26 NOTE — Progress Notes (Signed)
Pt went to Urgent care and was diagnosed with Pneumonia has Azithromycin for 7 days.  Pt being seen by speech therapy and seeing Dr. Harrietta GuardianYengal for swallowing issue.  Pt gets choked often on food.  Per pt Speech therapy says pt has to take a bite chew good, swallow twice and then take some water, then repeat.  Pt reports its rare if he doesn't choke.  Pt states his appetite would be normal if he could eat normal.  Pt has a cough with his breathing but nothing comes up and has had this.  Pt reports SOB on exertion.  Pt stated he has had a whole lot more energy and has been able to do a lot since starting his b-12 injections until he developed pneumonia.

## 2015-05-26 NOTE — Progress Notes (Signed)
[facility] day:  05/26/2015   Chief Complaint: MIR FULLILOVE is a 75 y.o. male with thrombocytopenia and B12 deficiency who is seen for 3 month assessment.  HPI: The patient was last seen in the medical oncology clinic on 02/17/2015.  At that time, he was very active.  Noted swallowing difficulties for 1 month.  Exam was unremarkable.  CBC revealed a heamtocrit of 38.9, hemoglobin 13.1, and MCV 91.6.  Ferritin was 43.  He received his B12.  Follow-up with Dr. Vira Agar was recommended.    He has received his B12 monthly (last 04/21/2015).  The patient was seen by speech therapy on 03/16/2015 for moderate oropharyngeal dysphagia characterized by pre-swallow loss of solid and liquid, delayed pharyngeal swallow initiation, decreased tongue base retraction, and decreased pharyngeal pressure generation.  ENT consultation was recommended.    He was seen by Dr. Frederich Cha in urgent care on 05/20/2015 for shortness of breath.  He was subsequently referred to the ER with chest pain and an irregular rhythm.  Chest CT angiogram revealed a small focus of airspace opacity in the posterior right lower lobe which may represent a nodular focus of consolidation but is indeterminate.  Neoplasm cannot be completely excluded. Recommend follow-up chest CT without contrast 1-2 months.  He was treated for a community acquired pneumonia with azithromycin (Z pack).  Symptomatically, he has felt great since his pneumonia.  He states that his biggest problem remains his swallowing.  He follows-up with ENT on 05/28/2015.   Past Medical History  Diagnosis Date  . GERD (gastroesophageal reflux disease)   . Anemia   . HTN (hypertension)   . Irregular heart beat   . HLD (hyperlipidemia)   . BPH (benign prostatic hyperplasia)   . Nocturia   . Urinary retention   . Dysphagia   . CHF (congestive heart failure) [facility])     Past Surgical History  Procedure Laterality Date   . Hip surgery Right 2014  . Bladder patching  1964  . Transurethral resection of prostate      Family History  Problem Relation Age of Onset  . Ulcers Mother     stomach  . GER disease Sister     GERD  . Heart attack Father   . Diabetes Brother   . Depression Brother   . Hiatal hernia Mother     Social History:  reports that he has never smoked. He has never used smokeless tobacco. He reports that he does not drink alcohol or use illicit drugs.   He is alone today.  Allergies:  Allergies  Allergen Reactions  . Hydrocodone-Acetaminophen Nausea And Vomiting    Current Medications: Current Outpatient Prescriptions  Medication Sig Dispense Refill  . acetaminophen (TYLENOL) 500 MG tablet Take by mouth.    Marland Kitchen aspirin 81 MG tablet Take 81 mg by mouth daily.    Marland Kitchen azithromycin (ZITHROMAX) 500 MG tablet Take 1 tablet (500 mg total) by mouth daily. Take 1 tablet daily for 3 days. 7 tablet 0  . bimatoprost (LUMIGAN) 0.01 % SOLN Apply to eye.    . dorzolamide-timolol (COSOPT) 22.3-6.8 MG/ML ophthalmic solution Apply to eye.    . dutasteride (AVODART) 0.5 MG capsule Take 1 capsule (0.5 mg total) by mouth daily. 30 capsule 12  . esomeprazole (NEXIUM) 20 MG capsule Take 20 mg by mouth daily at 12 noon.    . ferrous sulfate 325 (65 FE) MG tablet Take 325 mg by mouth daily  with breakfast.    . furosemide (LASIX) 20 MG tablet Take by mouth.    . gabapentin (NEURONTIN) 100 MG capsule     . Naproxen Sodium (ALEVE) 220 MG CAPS Take 1 capsule by mouth as needed.    . tadalafil (CIALIS) 5 MG tablet Take 5 mg by mouth daily.     . vitamin B-12 (CYANOCOBALAMIN) 1000 MCG tablet Take by mouth every 30 (thirty) days. Injection     No current facility-administered medications for this visit.    Review of Systems:  GENERAL:  Feels better.  No fevers, sweats or weight loss. PERFORMANCE STATUS (ECOG): 1 HEENT:  No visual changes, runny nose, sore throat, mouth sores or tenderness. Lungs:  Shortness  of breath with exertion.  No cough.  No hemoptysis. Cardiac:  No chest pain, palpitations, orthopnea, or PND. GI:  Diet good.  Difficulty swallowing (see HPI).  No nausea, vomiting, diarrhea, constipation, melena or hematochezia. GU:  No urgency, frequency, dysuria, or hematuria. Musculoskeletal:  No back pain.  No joint pain.  No muscle tenderness. Extremities:  No pain or swelling. Skin:  No rashes or skin changes. Neuro:  No headache, numbness or weakness, balance or coordination issues. Endocrine:  No diabetes, thyroid issues, hot flashes or night sweats. Psych:  No mood changes, depression or anxiety. Pain:  No focal pain. Review of systems:  All other systems reviewed and found to be negative.  Physical Exam: Blood pressure 139/77, pulse 89, temperature 96.6 F (35.9 C), temperature source Tympanic, resp. rate 20, height 6' 2" (1.88 m), weight 216 lb 13.2 oz (98.35 kg). GENERAL:  Well developed, well nourished, tall gentleman sitting comfortably in the exam room in no acute distress.  He has a cane at his side. MENTAL STATUS:  Alert and oriented to person, place and time. HEAD:  Pearline Cables hair.  Male pattern baldness.  Normocephalic, atraumatic, face symmetric, no Cushingoid features. EYES:  Glasses.  Blue eyes. Pupils equal round and reactive to light and accomodation.  No conjunctivitis or scleral icterus. ENT:  Clears throat regularly.  Oropharynx clear without lesion.  Tongue normal. Mucous membranes moist.  RESPIRATORY:  Clear to auscultation without rales, wheezes or rhonchi. CARDIOVASCULAR:  Regular rate and rhythm without murmur, rub or gallop. ABDOMEN:  Soft, non-tender, with active bowel sounds, and no hepatosplenomegaly.  No masses. SKIN:  Ecchymosis on arms.  No rashes, ulcers or lesions. EXTREMITIES: No edema, no skin discoloration or tenderness.  No palpable cords. LYMPH NODES: No palpable cervical, supraclavicular, axillary or inguinal adenopathy  NEUROLOGICAL:  Unremarkable. PSYCH:  Appropriate.   Appointment on 05/26/2015  Component Date Value Ref Range Status  . WBC 05/26/2015 6.9  3.8 - 10.6 K/uL Final  . RBC 05/26/2015 4.21* 4.40 - 5.90 MIL/uL Final  . Hemoglobin 05/26/2015 12.8* 13.0 - 18.0 g/dL Final  . HCT 05/26/2015 38.5* 40.0 - 52.0 % Final  . MCV 05/26/2015 91.5  80.0 - 100.0 fL Final  . MCH 05/26/2015 30.5  26.0 - 34.0 pg Final  . MCHC 05/26/2015 33.3  32.0 - 36.0 g/dL Final  . RDW 05/26/2015 14.1  11.5 - 14.5 % Final  . Platelets 05/26/2015 123* 150 - 440 K/uL Final  . Neutrophils Relative % 05/26/2015 65   Final  . Neutro Abs 05/26/2015 4.5  1.4 - 6.5 K/uL Final  . Lymphocytes Relative 05/26/2015 26   Final  . Lymphs Abs 05/26/2015 1.8  1.0 - 3.6 K/uL Final  . Monocytes Relative 05/26/2015 6   Final  .  Monocytes Absolute 05/26/2015 0.4  0.2 - 1.0 K/uL Final  . Eosinophils Relative 05/26/2015 2   Final  . Eosinophils Absolute 05/26/2015 0.1  0 - 0.7 K/uL Final  . Basophils Relative 05/26/2015 1   Final  . Basophils Absolute 05/26/2015 0.1  0 - 0.1 K/uL Final  . Sodium 05/26/2015 143  135 - 145 mmol/L Final  . Potassium 05/26/2015 4.3  3.5 - 5.1 mmol/L Final  . Chloride 05/26/2015 113* 101 - 111 mmol/L Final  . CO2 05/26/2015 22  22 - 32 mmol/L Final  . Glucose, Bld 05/26/2015 108* 65 - 99 mg/dL Final  . BUN 05/26/2015 26* 6 - 20 mg/dL Final  . Creatinine, Ser 05/26/2015 1.02  0.61 - 1.24 mg/dL Final  . Calcium 05/26/2015 9.0  8.9 - 10.3 mg/dL Final  . Total Protein 05/26/2015 6.4* 6.5 - 8.1 g/dL Final  . Albumin 05/26/2015 3.9  3.5 - 5.0 g/dL Final  . AST 05/26/2015 16  15 - 41 U/L Final  . ALT 05/26/2015 12* 17 - 63 U/L Final  . Alkaline Phosphatase 05/26/2015 151* 38 - 126 U/L Final  . Total Bilirubin 05/26/2015 0.8  0.3 - 1.2 mg/dL Final  . GFR calc non Af Amer 05/26/2015 >60  >60 mL/min Final  . GFR calc Af Amer 05/26/2015 >60  >60 mL/min Final   Comment: (NOTE) The eGFR has been calculated using the CKD EPI  equation. This calculation has not been validated in all clinical situations. eGFR's persistently <60 mL/min signify possible Chronic Kidney Disease.   . Anion gap 05/26/2015 8  5 - 15 Final    Assessment:  DORON SHAKE is a 75 y.o. 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  He denies any new medications or herbal products. He has no known autoimmune disease. He has never required transfusion. He recently had a ulceration of the left fourth toe and cellulitis requiring antibiotics (Keflex).  Work-up on 09/16/2014 revealed a hematocrit 36.4, hemoglobin 12.3, MCV 91.5, platelets 124,000 white count 4800 with an ANC of 3000. Differential was unremarkable. B12 was 262 (low normal) with an elevated MMA consistent with B12 deficiency.  He began weekly B12 x 6 on 10/14/2014, prior to initiation of monthly B12  Normal labs included: folate, CMP, ANA, iron studies, hepatitis C antibody, hepatitis B surface antigen and core antibody, HIV testing, SPEP, free light chains, and PTT. TSH  was normal on 08/25/2014.  UPEP revealed no monoclonal protein.  He has a history of iron deficiency anemia. EGD and colonoscopy 10 years ago were negative. He required esophageal dilatation. He has a history of reflux. Colonoscopy 3-4 years ago was unremarkable per the patient.  His diet is good.  He eats meat twice a week.  He is on oral iron.  He was diagnosed with a community acquired pneumonia on 05/20/2015.  He is completing a Z pack.  Chest CT angiogram on 05/20/2015 revealed a nodular focus of consolidation (indeterminate) for which neoplasm could not be completely excluded. Recommendation was for chest CT without contrast 1-2 months.   He has a history of an elevated alkaline phosphatase (138-151) without trend since 09/2011.  Symptomatically, he is feeling better.  He continues to have swallowing difficulties.  Exam  is stable.  Hematocrit is 38.5.  Ferritin is 52.  Plan: 1.  Labs today:  CBC with diff, CMP, ferritin. 2.  B12 today and monthly. 3.  Continue oral iron.  Ferritin  goal 100. 4.  Follow-up chest CT in 1-2 months. 5.  RTC in 3 months for MD assessment, labs (CBC with diff, ferritin, LFTs), and B12.   Lequita Asal, MD  05/26/2015, 1:11 PM

## 2015-06-23 ENCOUNTER — Inpatient Hospital Stay: Payer: Medicare PPO | Attending: Hematology and Oncology

## 2015-06-23 DIAGNOSIS — Z79899 Other long term (current) drug therapy: Secondary | ICD-10-CM | POA: Insufficient documentation

## 2015-06-23 DIAGNOSIS — E538 Deficiency of other specified B group vitamins: Secondary | ICD-10-CM | POA: Diagnosis present

## 2015-06-23 MED ORDER — CYANOCOBALAMIN 1000 MCG/ML IJ SOLN
1000.0000 ug | Freq: Once | INTRAMUSCULAR | Status: AC
  Administered 2015-06-23: 1000 ug via INTRAMUSCULAR
  Filled 2015-06-23: qty 1

## 2015-07-05 ENCOUNTER — Ambulatory Visit
Admission: RE | Admit: 2015-07-05 | Discharge: 2015-07-05 | Disposition: A | Discharge status: Home / Self Care | Payer: Medicare PPO | Source: Ambulatory Visit | Attending: Internal Medicine | Admitting: Internal Medicine

## 2015-07-05 DIAGNOSIS — J189 Pneumonia, unspecified organism: Secondary | ICD-10-CM | POA: Diagnosis not present

## 2015-07-05 DIAGNOSIS — I2510 Atherosclerotic heart disease of native coronary artery without angina pectoris: Secondary | ICD-10-CM | POA: Insufficient documentation

## 2015-07-08 ENCOUNTER — Ambulatory Visit (INDEPENDENT_AMBULATORY_CARE_PROVIDER_SITE_OTHER): Payer: Medicare PPO | Admitting: Urology

## 2015-07-08 ENCOUNTER — Encounter: Payer: Self-pay | Admitting: Urology

## 2015-07-08 VITALS — BP 156/65 | HR 78 | Ht 74.0 in | Wt 219.9 lb

## 2015-07-08 DIAGNOSIS — N401 Enlarged prostate with lower urinary tract symptoms: Secondary | ICD-10-CM

## 2015-07-08 DIAGNOSIS — Z87448 Personal history of other diseases of urinary system: Secondary | ICD-10-CM | POA: Diagnosis not present

## 2015-07-08 DIAGNOSIS — R351 Nocturia: Secondary | ICD-10-CM | POA: Diagnosis not present

## 2015-07-08 DIAGNOSIS — N138 Other obstructive and reflux uropathy: Secondary | ICD-10-CM

## 2015-07-08 DIAGNOSIS — Z87898 Personal history of other specified conditions: Secondary | ICD-10-CM

## 2015-07-08 MED ORDER — TADALAFIL 5 MG PO TABS: 5.0000 mg | ORAL_TABLET | Freq: Every day | ORAL | Status: DC

## 2015-07-08 MED ORDER — DUTASTERIDE 0.5 MG PO CAPS: 0.5000 mg | ORAL_CAPSULE | Freq: Every day | ORAL | Status: DC

## 2015-07-08 NOTE — Progress Notes (Signed)
9:44 AM   Jeremy Herrera 1941/01/02 [id number]  Referring provider: Mickey Farber, MD [address] [facility] [place], [place] [address]  Chief Complaint  Patient presents with  . Benign Prostatic Hypertrophy    6 month follow up  . Nocturia    HPI: Patient is a 75 year old Caucasian  male who presents today for a 6 month follow up for BPH with LUTS, nocturia and a history of urinary retention.      BPH with LUTS His IPSS score today is 7, which is mild LUTS.  He is pleased with his quality of life.   His last IPSS score  was 19/3.  He denies any dysuria, hematuria or suprapubic pain.  He is no longer taking the Cialis 5 mg daily consistently as they are cost prohibitive.  He is also taking dutasteride 0.5 mg daily.  His has had TURP with the removal of a bladder diverticulum in the remote past.  In the last couple of years, he had undergone 2 cystoscopic examinations. Both of the cystoscopic examinations did not identify any significant bladder outlet obstruction.  He also denies any recent fevers, chills, nausea or vomiting.   He does not have a family history of PCa.      IPSS      07/08/15 0900       International Prostate Symptom Score   How often have you had the sensation of not emptying your bladder? Less than 1 in 5     How often have you had to urinate less than every two hours? Less than 1 in 5 times     How often have you found you stopped and started again several times when you urinated? Less than 1 in 5 times     How often have you found it difficult to postpone urination? Less than 1 in 5 times     How often have you had a weak urinary stream? Not at All     How often have you had to strain to start urination? Less than 1 in 5 times     How many times did you typically get up at night to urinate? 2 Times     Total IPSS Score 7     Quality of Life due to urinary symptoms   If you were to spend the rest of your life with your urinary condition  just the way it is now how would you feel about that? Pleased        Score:  1-7 Mild 8-19 Moderate 20-35 Severe  Nocturia Patient's nocturia has diminished from 3-4 times nightly to 1-2times nightly. He is quite pleased with these results.  History of urinary retention Patient is voiding well since the initiation of Cialis 5 mg daily and dutasteride 0.5 mg daily  Note, he is in much better spirits on today's visit. 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 He is in a better place now since his wife passed away.  PMH: Past Medical History  Diagnosis Date  . GERD (gastroesophageal reflux disease)   . Anemia   . HTN (hypertension)   . Irregular heart beat   . HLD (hyperlipidemia)   . BPH (benign prostatic hyperplasia)   . Nocturia   . Urinary retention   . Dysphagia   . CHF (congestive heart failure) [facility])     Surgical History: Past Surgical History  Procedure Laterality Date  . Hip surgery Right 2014  . Bladder patching  1964  . Transurethral resection of prostate      Home Medications:    Medication List       This list is accurate as of: 07/08/15  9:44 AM.  Always use your most recent med list.               acetaminophen 500 MG tablet  Commonly known as:  TYLENOL  Take by mouth.     ALEVE 220 MG Caps  Generic drug:  Naproxen Sodium  Take 1 capsule by mouth as needed.     aspirin 81 MG tablet  Take 81 mg by mouth daily.     bimatoprost 0.01 % Soln  Commonly known as:  LUMIGAN  Apply to eye.     dorzolamide-timolol 22.3-6.8 MG/ML ophthalmic solution  Commonly known as:  COSOPT  Apply to eye.     dutasteride 0.5 MG capsule  Commonly known as:  AVODART  Take 1 capsule (0.5 mg total) by mouth daily.     esomeprazole 20 MG capsule  Commonly known as:  NEXIUM  Take 20 mg by mouth daily at 12 noon.     ferrous sulfate 325 (65 FE) MG tablet  Take 325 mg by mouth daily with breakfast.      furosemide 20 MG tablet  Commonly known as:  LASIX  Take by mouth.     gabapentin 100 MG capsule  Commonly known as:  NEURONTIN     tadalafil 5 MG tablet  Commonly known as:  CIALIS  Take 1 tablet (5 mg total) by mouth daily.     vitamin B-12 1000 MCG tablet  Commonly known as:  CYANOCOBALAMIN  Take by mouth every 30 (thirty) days. Reported on 07/08/2015     VITAMIN B-12 IJ  Inject as directed every 30 (thirty) days.        Allergies:  Allergies  Allergen Reactions  . Hydrocodone-Acetaminophen Nausea And Vomiting    Family History: Family History  Problem Relation Age of Onset  . Ulcers Mother     stomach  . GER disease Sister     GERD  . Heart attack Father   . Diabetes Brother   . Depression Brother   . Kidney disease Neg Hx   . Prostate cancer Neg Hx     Social History:  reports that he has never smoked. He has never used smokeless tobacco. He reports that he does not drink alcohol or use illicit drugs.  ROS: UROLOGY Frequent Urination?: Yes Hard to postpone urination?: No Burning/pain with urination?: No Get up at night to urinate?: Yes Leakage of urine?: No Urine stream starts and stops?: No Trouble starting stream?: No Do you have to strain to urinate?: No Blood in urine?: No Urinary tract infection?: No Sexually transmitted disease?: No Injury to kidneys or bladder?: No Painful intercourse?: No Weak stream?: No Erection problems?: Yes Penile pain?: No  Gastrointestinal Nausea?: No Vomiting?: No Indigestion/heartburn?: No Diarrhea?: No Constipation?: No  Constitutional Fever: No Night sweats?: No Weight loss?: No Fatigue?: No  Skin Skin rash/lesions?: Yes Itching?: Yes  Eyes Blurred vision?: No Double vision?: No  Ears/Nose/Throat Sore throat?: No Sinus problems?: No  Hematologic/Lymphatic Swollen glands?: No Easy bruising?: Yes  Cardiovascular Leg swelling?: Yes Chest pain?: No  Respiratory Cough?: Yes Shortness of  breath?: No  Endocrine Excessive thirst?: No  Musculoskeletal Back pain?: No Joint pain?: No  Neurological Headaches?: No Dizziness?:  No  Psychologic Depression?: No Anxiety?: Yes  Physical Exam: BP 156/65 mmHg  Pulse 78  Ht 6\' 2"  (1.88 m)  Wt 219 lb 14.4 oz (99.746 kg)  BMI 28.22 kg/m2  Constitutional: Well nourished. Alert and oriented, No acute distress. HEENT: [place] AT, moist mucus membranes. Trachea midline, no masses. Cardiovascular: No clubbing, cyanosis, or edema. Respiratory: Normal respiratory effort, no increased work of breathing. GI: Abdomen is soft, non tender, non distended, no abdominal masses. Liver and spleen not palpable.  No hernias appreciated.  Stool sample for occult testing is not indicated.   GU: No CVA tenderness.  No bladder fullness or masses.  Patient with uncircumcised phallus. Foreskin is difficult to retract.  Urethral meatus is patent.  No penile discharge. No penile lesions or rashes. Scrotum without lesions, cysts, rashes and/or edema.  Testicles are located scrotally bilaterally. No masses are appreciated in the testicles. Left and right epididymis are normal. Rectal: Patient with  normal sphincter tone. Anus and perineum without scarring or rashes. No rectal masses are appreciated. Prostate is approximately 45 grams, no nodules are appreciated. Seminal vesicles are normal. Skin: No rashes, bruises or suspicious lesions. Lymph: No cervical or inguinal adenopathy. Neurologic: Grossly intact, no focal deficits, moving all 4 extremities. Psychiatric: Normal mood and affect.  Laboratory Data: Lab Results  Component Value Date   WBC 6.9 05/26/2015   HGB 12.8* 05/26/2015   HCT 38.5* 05/26/2015   MCV 91.5 05/26/2015   PLT 123* 05/26/2015    Lab Results  Component Value Date   CREATININE 1.02 05/26/2015    Lab Results  Component Value Date   PSA 0.2 10/04/2011   PSA history:  0.4 ng/mL on 06/21/2012  0.1 ng/mL on 07/02/2013  0.1 ng/mL  on 04/10/2014   Assessment & Plan:    1. BPH (benign prostatic hyperplasia) with LUTS:  Patient's IPSS score is 7/1.  He has found improvement with the dutasteride 0.5 mg daily.   he is taking the Cialis 5 mg daily and consistently because she is finding it cost prohibitive.  Did give him 30 days of samples at today's visit.   He will continue that medication.  He will return to the clinic in 12 months for IPSS score and exam.  2. Nocturia:  Patient has found a great reduction in his nocturia at this time. We will continue to monitor.   3. History of urinary retention:   Patient has a history of urinary retention which has resolved with medical therapy for BPH. We will continue to monitor. H    Return in about 1 year (around 07/07/2016) for IPSS and exam.  Michiel CowboySHANNON Zoanne Newill, [facility]  [facility] [address], [address] [place], [place] [id number] [phone]

## 2015-07-19 ENCOUNTER — Encounter: Payer: Self-pay | Admitting: Hematology and Oncology

## 2015-07-28 ENCOUNTER — Inpatient Hospital Stay: Payer: Medicare PPO | Attending: Hematology and Oncology

## 2015-07-28 DIAGNOSIS — E538 Deficiency of other specified B group vitamins: Secondary | ICD-10-CM | POA: Diagnosis not present

## 2015-07-28 DIAGNOSIS — Z79899 Other long term (current) drug therapy: Secondary | ICD-10-CM | POA: Insufficient documentation

## 2015-07-28 MED ORDER — CYANOCOBALAMIN 1000 MCG/ML IJ SOLN
1000.0000 ug | Freq: Once | INTRAMUSCULAR | Status: AC
  Administered 2015-07-28: 1000 ug via INTRAMUSCULAR
  Filled 2015-07-28: qty 1

## 2015-08-25 ENCOUNTER — Inpatient Hospital Stay: Payer: Medicare PPO | Attending: Hematology and Oncology

## 2015-08-25 ENCOUNTER — Inpatient Hospital Stay (HOSPITAL_BASED_OUTPATIENT_CLINIC_OR_DEPARTMENT_OTHER): Payer: Medicare PPO | Admitting: Hematology and Oncology

## 2015-08-25 ENCOUNTER — Encounter: Payer: Self-pay | Admitting: Hematology and Oncology

## 2015-08-25 ENCOUNTER — Other Ambulatory Visit: Payer: Self-pay

## 2015-08-25 ENCOUNTER — Inpatient Hospital Stay: Payer: Medicare PPO

## 2015-08-25 VITALS — BP 132/72 | HR 81 | Temp 97.6°F | Resp 23 | Ht 74.0 in | Wt 222.4 lb

## 2015-08-25 DIAGNOSIS — I10 Essential (primary) hypertension: Secondary | ICD-10-CM

## 2015-08-25 DIAGNOSIS — R748 Abnormal levels of other serum enzymes: Secondary | ICD-10-CM | POA: Insufficient documentation

## 2015-08-25 DIAGNOSIS — D509 Iron deficiency anemia, unspecified: Secondary | ICD-10-CM | POA: Insufficient documentation

## 2015-08-25 DIAGNOSIS — R918 Other nonspecific abnormal finding of lung field: Secondary | ICD-10-CM | POA: Insufficient documentation

## 2015-08-25 DIAGNOSIS — R1310 Dysphagia, unspecified: Secondary | ICD-10-CM

## 2015-08-25 DIAGNOSIS — D649 Anemia, unspecified: Secondary | ICD-10-CM

## 2015-08-25 DIAGNOSIS — E785 Hyperlipidemia, unspecified: Secondary | ICD-10-CM

## 2015-08-25 DIAGNOSIS — I509 Heart failure, unspecified: Secondary | ICD-10-CM | POA: Diagnosis not present

## 2015-08-25 DIAGNOSIS — Z79899 Other long term (current) drug therapy: Secondary | ICD-10-CM

## 2015-08-25 DIAGNOSIS — E538 Deficiency of other specified B group vitamins: Secondary | ICD-10-CM

## 2015-08-25 DIAGNOSIS — Z8701 Personal history of pneumonia (recurrent): Secondary | ICD-10-CM | POA: Diagnosis not present

## 2015-08-25 DIAGNOSIS — E611 Iron deficiency: Secondary | ICD-10-CM

## 2015-08-25 DIAGNOSIS — N400 Enlarged prostate without lower urinary tract symptoms: Secondary | ICD-10-CM | POA: Diagnosis not present

## 2015-08-25 DIAGNOSIS — D696 Thrombocytopenia, unspecified: Secondary | ICD-10-CM | POA: Insufficient documentation

## 2015-08-25 DIAGNOSIS — Z7982 Long term (current) use of aspirin: Secondary | ICD-10-CM | POA: Diagnosis not present

## 2015-08-25 DIAGNOSIS — K219 Gastro-esophageal reflux disease without esophagitis: Secondary | ICD-10-CM | POA: Diagnosis not present

## 2015-08-25 LAB — CBC WITH DIFFERENTIAL/PLATELET
Basophils Absolute: 0.1 10*3/uL (ref 0–0.1)
Basophils Relative: 1 %
Eosinophils Absolute: 0.1 10*3/uL (ref 0–0.7)
Eosinophils Relative: 2 %
HCT: 38.7 % — ABNORMAL LOW (ref 40.0–52.0)
Hemoglobin: 13.1 g/dL (ref 13.0–18.0)
Lymphocytes Relative: 27 %
Lymphs Abs: 1.5 10*3/uL (ref 1.0–3.6)
MCH: 30.9 pg (ref 26.0–34.0)
MCHC: 33.9 g/dL (ref 32.0–36.0)
MCV: 91.1 fL (ref 80.0–100.0)
Monocytes Absolute: 0.3 10*3/uL (ref 0.2–1.0)
Monocytes Relative: 6 %
Neutro Abs: 3.5 10*3/uL (ref 1.4–6.5)
Neutrophils Relative %: 64 %
Platelets: 123 10*3/uL — ABNORMAL LOW (ref 150–440)
RBC: 4.24 MIL/uL — ABNORMAL LOW (ref 4.40–5.90)
RDW: 13.3 % (ref 11.5–14.5)
WBC: 5.5 10*3/uL (ref 3.8–10.6)

## 2015-08-25 LAB — HEPATIC FUNCTION PANEL
ALT: 14 U/L — ABNORMAL LOW (ref 17–63)
AST: 16 U/L (ref 15–41)
Albumin: 3.9 g/dL (ref 3.5–5.0)
Alkaline Phosphatase: 127 U/L — ABNORMAL HIGH (ref 38–126)
Bilirubin, Direct: <0.1 mg/dL — ABNORMAL LOW (ref 0.1–0.5)
Total Bilirubin: 0.9 mg/dL (ref 0.3–1.2)
Total Protein: 6.4 g/dL — ABNORMAL LOW (ref 6.5–8.1)

## 2015-08-25 LAB — FOLATE: Folate: 20.2 ng/mL (ref >5.9)

## 2015-08-25 LAB — FERRITIN: Ferritin: 49 ng/mL (ref 24–336)

## 2015-08-25 MED ORDER — CYANOCOBALAMIN 1000 MCG/ML IJ SOLN
1000.0000 ug | Freq: Once | INTRAMUSCULAR | Status: AC
  Administered 2015-08-25: 1000 ug via INTRAMUSCULAR
  Filled 2015-08-25: qty 1

## 2015-08-25 NOTE — Progress Notes (Signed)
[facility]-  Cancer Center  Clinic day:  08/25/2015   Chief Complaint: JAYSIN GAYLER is a 75 y.o. male with B12 deficiency, iron deficiency, and mild thrombocytopenia (ITP) who is seen for 3 month assessment.  HPI: The patient was last seen in the medical oncology clinic on 05/26/2015.  At that time, he felt better.  He continued to have swallowing difficulties.  Exam was stable.  Hematocrit was 38.5.  Ferritin was 52.  He was to continue his oral iron.  He received B12.  He has continued to receive his B12 monthly (last 07/28/2015).  Chest CT on 07/05/2015 revealed resolved medial right lower lobe consolidation with residual mild ground-glass opacity, consistent with a nearly resolved infectious or inflammatory focus.  There was mild patchy subpleural reticulation and ground-glass attenuation in both lungs, cannot exclude interstitial lung disease such as nonspecific interstitial pneumonia (NSIP). Consider a follow-up high-resolution chest CT study in 12 months   Symptomatically, he notes swallowing difficulties. He coughs regularly. He needs to eat slow and with small bites. He takes small bites alternating with sips of water.  He has reflux.    Past Medical History  Diagnosis Date  . GERD (gastroesophageal reflux disease)   . Anemia   . HTN (hypertension)   . Irregular heart beat   . HLD (hyperlipidemia)   . BPH (benign prostatic hyperplasia)   . Nocturia   . Urinary retention   . Dysphagia   . CHF (congestive heart failure) [facility])     Past Surgical History  Procedure Laterality Date  . Hip surgery Right 2014  . Bladder patching  1964  . Transurethral resection of prostate      Family History  Problem Relation Age of Onset  . Ulcers Mother     stomach  . GER disease Sister     GERD  . Heart attack Father   . Diabetes Brother   . Depression Brother   . Kidney disease Neg Hx   . Prostate cancer Neg Hx     Social History:  reports that he has  never smoked. He has never used smokeless tobacco. He reports that he does not drink alcohol or use illicit drugs.   He is alone today.  Allergies:  Allergies  Allergen Reactions  . Hydrocodone-Acetaminophen Nausea And Vomiting    Current Medications: Current Outpatient Prescriptions  Medication Sig Dispense Refill  . acetaminophen (TYLENOL) 500 MG tablet Take by mouth.    Marland Kitchen aspirin 81 MG tablet Take 81 mg by mouth daily.    . bimatoprost (LUMIGAN) 0.01 % SOLN Apply to eye.    . Cyanocobalamin (VITAMIN B-12 IJ) Inject as directed every 30 (thirty) days.    . dorzolamide-timolol (COSOPT) 22.3-6.8 MG/ML ophthalmic solution Apply to eye.    . dutasteride (AVODART) 0.5 MG capsule Take 1 capsule (0.5 mg total) by mouth daily. 30 capsule 12  . esomeprazole (NEXIUM) 20 MG capsule Take 20 mg by mouth daily at 12 noon.    . ferrous sulfate 325 (65 FE) MG tablet Take 325 mg by mouth daily with breakfast.    . furosemide (LASIX) 20 MG tablet Take by mouth.    . gabapentin (NEURONTIN) 100 MG capsule     . Naproxen Sodium (ALEVE) 220 MG CAPS Take 1 capsule by mouth as needed.    . tadalafil (CIALIS) 5 MG tablet Take 1 tablet (5 mg total) by mouth daily. 30 tablet 12  . vitamin B-12 (CYANOCOBALAMIN) 1000 MCG tablet  Take by mouth every 30 (thirty) days. Reported on 07/08/2015     No current facility-administered medications for this visit.    Review of Systems:  GENERAL:  Feels "ok".  No fevers or sweats.  Weight up 6 pounds. PERFORMANCE STATUS (ECOG): 1 HEENT:  No visual changes, runny nose, sore throat, mouth sores or tenderness. Lungs:  Shortness of breath with exertion.  No cough.  No hemoptysis. Cardiac:  No chest pain, palpitations, orthopnea, or PND. GI:   Swallowing difficulties (see HPI).  No nausea, vomiting, diarrhea, constipation, melena or hematochezia. GU:  No urgency, frequency, dysuria, or hematuria. Musculoskeletal:  No back pain.  No joint pain.  No muscle  tenderness. Extremities:  No pain or swelling. Skin:  No rashes or skin changes. Neuro:  No headache, numbness or weakness, balance or coordination issues. Endocrine:  No diabetes, thyroid issues, hot flashes or night sweats. Psych:  No mood changes, depression or anxiety. Pain:  No focal pain. Review of systems:  All other systems reviewed and found to be negative.  Physical Exam: Blood pressure 132/72, pulse 81, temperature 97.6 F (36.4 C), temperature source Tympanic, resp. rate 23, height 6\' 2"  (1.88 m), weight 222 lb 7.1 oz (100.9 kg). GENERAL:  Well developed, well nourished, tall gentleman sitting comfortably in the exam room in no acute distress.  He has a cane at his side. MENTAL STATUS:  Alert and oriented to person, place and time. HEAD:  Wallace CullensGray hair.  Male pattern baldness.  Normocephalic, atraumatic, face symmetric, no Cushingoid features. EYES:  Glasses.  Blue eyes. Pupils equal round and reactive to light and accomodation.  No conjunctivitis or scleral icterus. ENT:  Clears throat regularly.  Oropharynx clear without lesion.  Tongue normal. Mucous membranes moist.  RESPIRATORY:  Clear to auscultation without rales, wheezes or rhonchi. CARDIOVASCULAR:  Regular rate and rhythm without murmur, rub or gallop. ABDOMEN:  Soft, non-tender, with active bowel sounds, and no hepatosplenomegaly.  No masses. SKIN:  Ecchymosis on arms.  No rashes, ulcers or lesions. EXTREMITIES: No edema, no skin discoloration or tenderness.  No palpable cords. LYMPH NODES: No palpable cervical, supraclavicular, axillary or inguinal adenopathy  NEUROLOGICAL: Unremarkable. PSYCH:  Appropriate.    Appointment on 08/25/2015  Component Date Value Ref Range Status  . WBC 08/25/2015 5.5  3.8 - 10.6 K/uL Final  . RBC 08/25/2015 4.24* 4.40 - 5.90 MIL/uL Final  . Hemoglobin 08/25/2015 13.1  13.0 - 18.0 g/dL Final  . HCT 16/10/960407/06/2015 38.7* 40.0 - 52.0 % Final  . MCV 08/25/2015 91.1  80.0 - 100.0 fL Final  .  MCH 08/25/2015 30.9  26.0 - 34.0 pg Final  . MCHC 08/25/2015 33.9  32.0 - 36.0 g/dL Final  . RDW 54/09/811907/06/2015 13.3  11.5 - 14.5 % Final  . Platelets 08/25/2015 123* 150 - 440 K/uL Final  . Neutrophils Relative % 08/25/2015 64   Final  . Neutro Abs 08/25/2015 3.5  1.4 - 6.5 K/uL Final  . Lymphocytes Relative 08/25/2015 27   Final  . Lymphs Abs 08/25/2015 1.5  1.0 - 3.6 K/uL Final  . Monocytes Relative 08/25/2015 6   Final  . Monocytes Absolute 08/25/2015 0.3  0.2 - 1.0 K/uL Final  . Eosinophils Relative 08/25/2015 2   Final  . Eosinophils Absolute 08/25/2015 0.1  0 - 0.7 K/uL Final  . Basophils Relative 08/25/2015 1   Final  . Basophils Absolute 08/25/2015 0.1  0 - 0.1 K/uL Final  . Total Protein 08/25/2015 6.4* 6.5 -  8.1 g/dL Final  . Albumin 78/29/562107/06/2015 3.9  3.5 - 5.0 g/dL Final  . AST 30/86/578407/06/2015 16  15 - 41 U/L Final  . ALT 08/25/2015 14* 17 - 63 U/L Final  . Alkaline Phosphatase 08/25/2015 127* 38 - 126 U/L Final  . Total Bilirubin 08/25/2015 0.9  0.3 - 1.2 mg/dL Final  . Bilirubin, Direct 08/25/2015 <0.1* 0.1 - 0.5 mg/dL Final  . Indirect Bilirubin 08/25/2015 NOT CALCULATED  0.3 - 0.9 mg/dL Final    Assessment:  Elenor QuinonesRichard L Pikus is a 75 y.o. 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  He denies any new medications or herbal products. He has no known autoimmune disease. He has never required transfusion.   Work-up on 09/16/2014 revealed a hematocrit 36.4, hemoglobin 12.3, MCV 91.5, platelets 124,000 white count 4800 with an ANC of 3000. Differential was unremarkable. B12 was 262 (low normal) with an elevated MMA consistent with B12 deficiency.  He began weekly 10/14/2014 (last 07/28/2015).  Normal labs included: folate, CMP, ANA, iron studies, hepatitis C antibody, hepatitis B surface antigen and core antibody, HIV testing, SPEP, free light chains, and PTT. TSH  was normal on 08/25/2014.  UPEP  revealed no monoclonal protein.  He has a history of iron deficiency anemia.  EGD and colonoscopy 10 years ago were negative. He required esophageal dilatation. He has a history of reflux. Colonoscopy 3-4 years ago was unremarkable per the patient.  His diet is good.  He eats meat twice a week.  He is on oral iron.  He was diagnosed with a community acquired pneumonia on 05/20/2015.  He is completing a Z pack.  Chest CT angiogram on 05/20/2015 revealed a nodular focus of consolidation (indeterminate) for which neoplasm could not be completely excluded. Recommendation was for chest CT without contrast 1-2 months.   Chest CT on 07/05/2015 revealed resolved medial right lower lobe consolidation with residual mild ground-glass opacity.  There was mild patchy subpleural reticulation and ground-glass attenuation in both lungs, cannot exclude interstitial lung disease such as nonspecific interstitial pneumonia (NSIP).   He has a history of an elevated alkaline phosphatase (138-151) without trend since 09/2011.  Symptomatically, he has ongoing swallowing difficulties.  Exam is stable.  Hematocrit is 38.7.  Ferritin is 49.  Plan: 1.  Labs today:  CBC with diff, LFTs, ferritin, folate. 2.  B12 today and monthly. 3.  Continue oral iron.  Ferritin goal 100. 4.  Review interval chest CT.  Anticipate high resolution chest CT in 12 months. 5.  RTC in 3 months for labs (CBC with diff, ferritin) + B12 6.  RTC in 6 months for MD assessment, labs (CBC with diff, ferritin, folate) + B12.   Rosey BathMelissa C Corcoran, MD  08/25/2015, 12:13 PM

## 2015-08-25 NOTE — Progress Notes (Signed)
Pt reports the same difficulty swallowing liquids and solids [facility]  No other changes.

## 2015-09-22 ENCOUNTER — Inpatient Hospital Stay: Payer: Medicare PPO

## 2015-09-24 ENCOUNTER — Inpatient Hospital Stay: Payer: Medicare PPO | Attending: Hematology and Oncology

## 2015-09-24 VITALS — BP 124/75 | HR 75 | Temp 96.4°F

## 2015-09-24 DIAGNOSIS — E538 Deficiency of other specified B group vitamins: Secondary | ICD-10-CM | POA: Diagnosis present

## 2015-09-24 DIAGNOSIS — Z79899 Other long term (current) drug therapy: Secondary | ICD-10-CM | POA: Insufficient documentation

## 2015-09-24 MED ORDER — CYANOCOBALAMIN 1000 MCG/ML IJ SOLN
1000.0000 ug | Freq: Once | INTRAMUSCULAR | Status: AC
Start: 2015-09-24 — End: 2015-09-24
  Administered 2015-09-24: 1000 ug via INTRAMUSCULAR
  Filled 2015-09-24: qty 1

## 2015-10-02 DIAGNOSIS — R1310 Dysphagia, unspecified: Secondary | ICD-10-CM | POA: Insufficient documentation

## 2015-10-27 ENCOUNTER — Inpatient Hospital Stay: Payer: Medicare PPO | Attending: Hematology and Oncology

## 2015-10-27 DIAGNOSIS — Z79899 Other long term (current) drug therapy: Secondary | ICD-10-CM | POA: Insufficient documentation

## 2015-10-27 DIAGNOSIS — E538 Deficiency of other specified B group vitamins: Secondary | ICD-10-CM | POA: Insufficient documentation

## 2015-11-02 ENCOUNTER — Inpatient Hospital Stay: Payer: Medicare PPO

## 2015-11-02 VITALS — Resp 20

## 2015-11-02 DIAGNOSIS — E538 Deficiency of other specified B group vitamins: Secondary | ICD-10-CM | POA: Diagnosis not present

## 2015-11-02 DIAGNOSIS — Z79899 Other long term (current) drug therapy: Secondary | ICD-10-CM | POA: Diagnosis not present

## 2015-11-02 MED ORDER — CYANOCOBALAMIN 1000 MCG/ML IJ SOLN
1000.0000 ug | Freq: Once | INTRAMUSCULAR | Status: AC
  Administered 2015-11-02: 1000 ug via INTRAMUSCULAR
  Filled 2015-11-02: qty 1

## 2015-11-21 ENCOUNTER — Encounter: Payer: Self-pay | Admitting: Hematology and Oncology

## 2015-11-23 DIAGNOSIS — Z79899 Other long term (current) drug therapy: Secondary | ICD-10-CM | POA: Insufficient documentation

## 2015-11-24 ENCOUNTER — Other Ambulatory Visit: Payer: Self-pay | Admitting: Hematology and Oncology

## 2015-11-24 ENCOUNTER — Inpatient Hospital Stay: Payer: Medicare PPO

## 2015-11-24 ENCOUNTER — Telehealth: Payer: Self-pay | Admitting: *Deleted

## 2015-11-24 ENCOUNTER — Inpatient Hospital Stay: Payer: Medicare PPO | Attending: Hematology and Oncology

## 2015-11-24 ENCOUNTER — Other Ambulatory Visit: Payer: Self-pay | Admitting: *Deleted

## 2015-11-24 DIAGNOSIS — E538 Deficiency of other specified B group vitamins: Secondary | ICD-10-CM | POA: Insufficient documentation

## 2015-11-24 DIAGNOSIS — D709 Neutropenia, unspecified: Secondary | ICD-10-CM

## 2015-11-24 DIAGNOSIS — E611 Iron deficiency: Secondary | ICD-10-CM

## 2015-11-24 DIAGNOSIS — Z79899 Other long term (current) drug therapy: Secondary | ICD-10-CM | POA: Diagnosis not present

## 2015-11-24 LAB — CBC WITH DIFFERENTIAL/PLATELET
Basophils Absolute: 0.1 10*3/uL (ref 0–0.1)
Basophils Relative: 1 %
Eosinophils Absolute: 0.2 10*3/uL (ref 0–0.7)
Eosinophils Relative: 4 %
HCT: 39.6 % — ABNORMAL LOW (ref 40.0–52.0)
Hemoglobin: 13.2 g/dL (ref 13.0–18.0)
Lymphocytes Relative: 26 %
Lymphs Abs: 1.4 10*3/uL (ref 1.0–3.6)
MCH: 30.7 pg (ref 26.0–34.0)
MCHC: 33.3 g/dL (ref 32.0–36.0)
MCV: 92 fL (ref 80.0–100.0)
Monocytes Absolute: 0.4 10*3/uL (ref 0.2–1.0)
Monocytes Relative: 8 %
Neutro Abs: 3.4 10*3/uL (ref 1.4–6.5)
Neutrophils Relative %: 61 %
Platelets: 130 10*3/uL — ABNORMAL LOW (ref 150–440)
RBC: 4.31 MIL/uL — ABNORMAL LOW (ref 4.40–5.90)
RDW: 13.6 % (ref 11.5–14.5)
WBC: 5.5 10*3/uL (ref 3.8–10.6)

## 2015-11-24 LAB — FERRITIN: Ferritin: 61 ng/mL (ref 24–336)

## 2015-11-24 MED ORDER — CYANOCOBALAMIN 1000 MCG/ML IJ SOLN
1000.0000 ug | Freq: Once | INTRAMUSCULAR | Status: AC
Start: 2015-11-24 — End: 2015-11-24
  Administered 2015-11-24: 1000 ug via INTRAMUSCULAR
  Filled 2015-11-24: qty 1

## 2015-11-24 NOTE — Telephone Encounter (Signed)
Called patient and LVM to disregard our message earlier regarding a neutropenic diet.  Explained that our lab had a problem and once it was corrected the labs were rerun and all is okay.

## 2015-11-24 NOTE — Telephone Encounter (Signed)
Called patient and lvm for patient to ask if he is taking any new medications or herbal products. Patient's ANC is very low.  Patient should be on neutropenic precautions. ( If he has someone else to wash fresh fruits and vegetables and peel them, he is okay to eat them.  Otherwise, he should eat only frozen or canned foods.  He should perform strict handwashing, stay away from anyone who might be sick and not have fresh cut flowers around him.  He can call back to office if he has further questions.

## 2015-11-24 NOTE — Telephone Encounter (Signed)
Cordelia PenSherry, I believe this message was sent to me by mistake and was meant for you.  Thank you.

## 2015-12-22 ENCOUNTER — Other Ambulatory Visit: Payer: Self-pay | Admitting: Hematology and Oncology

## 2015-12-22 ENCOUNTER — Inpatient Hospital Stay: Payer: Medicare PPO | Attending: Hematology and Oncology

## 2015-12-22 VITALS — BP 134/81 | HR 82 | Temp 98.5°F | Resp 18

## 2015-12-22 DIAGNOSIS — E538 Deficiency of other specified B group vitamins: Secondary | ICD-10-CM

## 2015-12-22 DIAGNOSIS — Z79899 Other long term (current) drug therapy: Secondary | ICD-10-CM | POA: Insufficient documentation

## 2015-12-22 MED ORDER — CYANOCOBALAMIN 1000 MCG/ML IJ SOLN
1000.0000 ug | Freq: Once | INTRAMUSCULAR | Status: AC
  Administered 2015-12-22: 1000 ug via INTRAMUSCULAR

## 2016-01-26 ENCOUNTER — Inpatient Hospital Stay: Payer: Medicare PPO | Attending: Hematology and Oncology

## 2016-01-26 VITALS — BP 161/86 | HR 87 | Temp 97.4°F | Resp 18

## 2016-01-26 DIAGNOSIS — Z79899 Other long term (current) drug therapy: Secondary | ICD-10-CM | POA: Insufficient documentation

## 2016-01-26 DIAGNOSIS — E538 Deficiency of other specified B group vitamins: Secondary | ICD-10-CM | POA: Diagnosis present

## 2016-01-26 MED ORDER — CYANOCOBALAMIN 1000 MCG/ML IJ SOLN
1000.0000 ug | Freq: Once | INTRAMUSCULAR | Status: AC
Start: 2016-01-26 — End: 2016-01-26
  Administered 2016-01-26: 1000 ug via INTRAMUSCULAR
  Filled 2016-01-26: qty 1

## 2016-01-28 IMAGING — US US EXTREM LOW VENOUS*R*
1 series · 13 of 24 positions shown · non-contrast
Comparison: None.

CLINICAL DATA: Right lower extremity pain and edema. History of
varicose veins. Evaluate for DVT.



[Series 1: us extrem low venous*right* · 0.07mm/px · 13 of 46 slices shown]
[im 1/46]
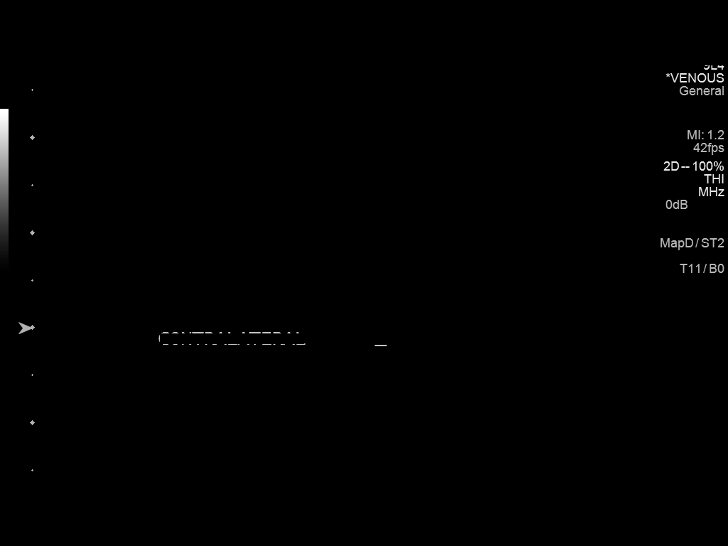
[im 4/46]
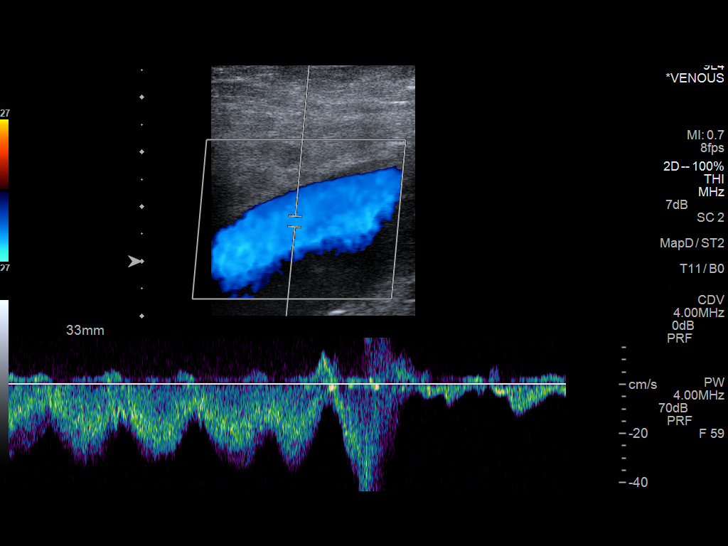
[im 8/46]
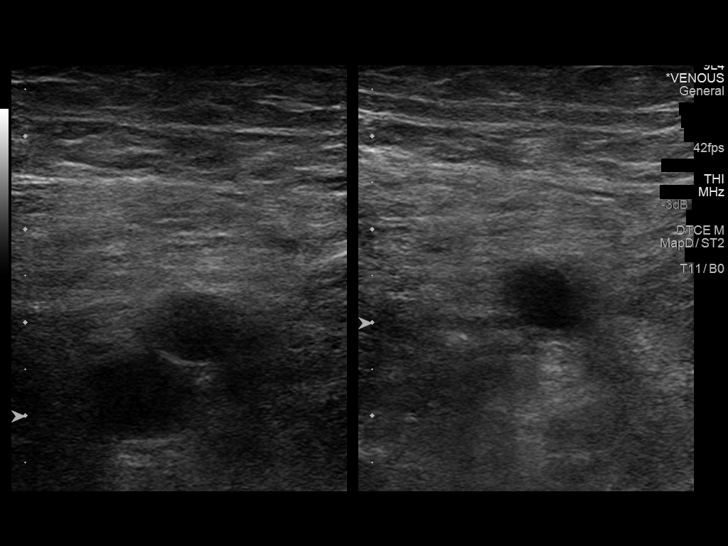
[im 12/46]
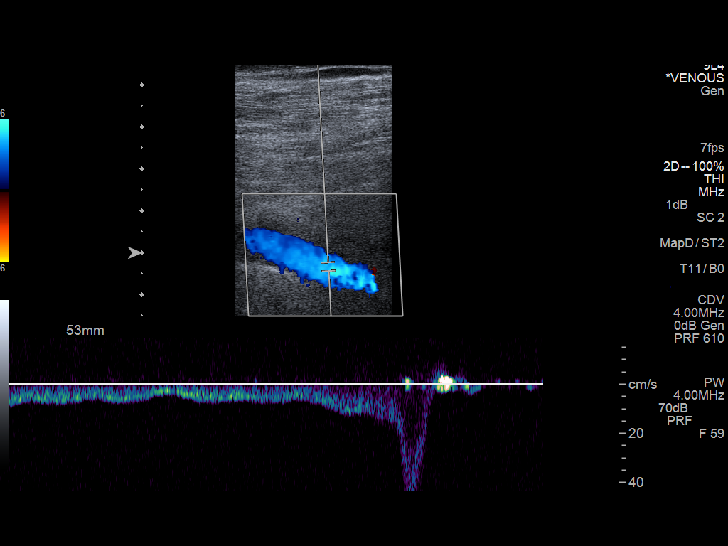
[im 16/46]
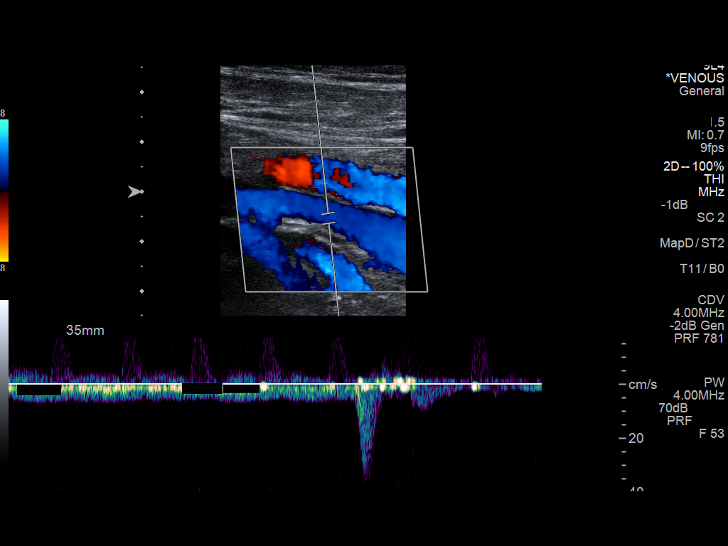
[im 20/46]
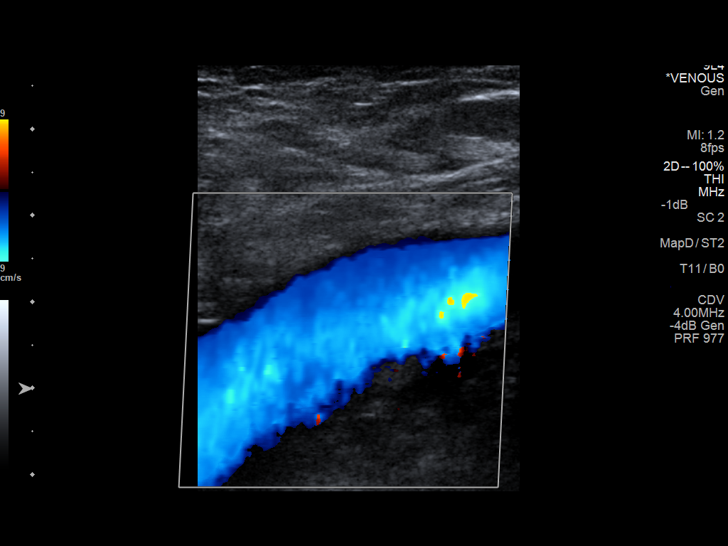
[im 24/46]
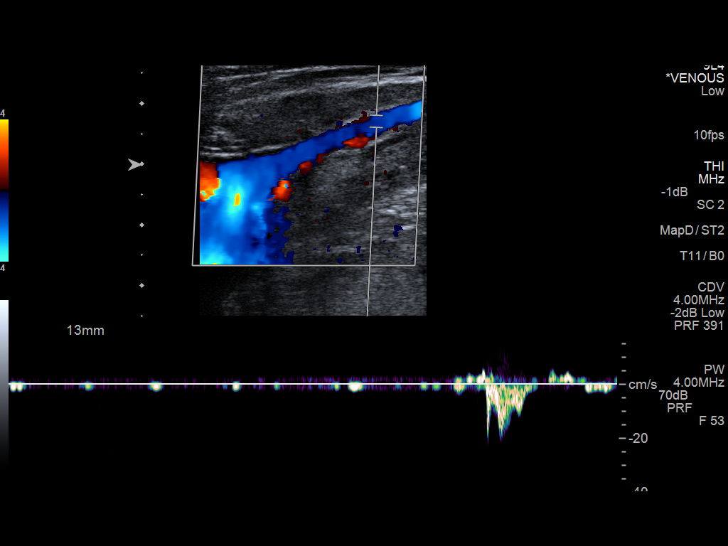
[im 26/46]
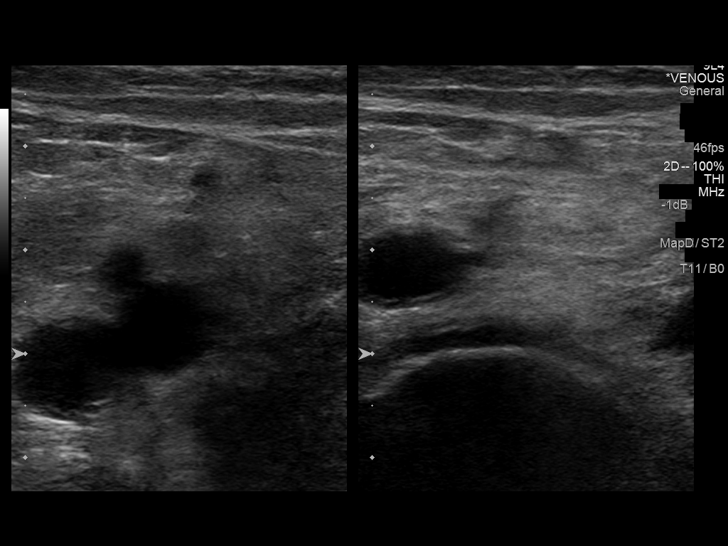
[im 30/46]
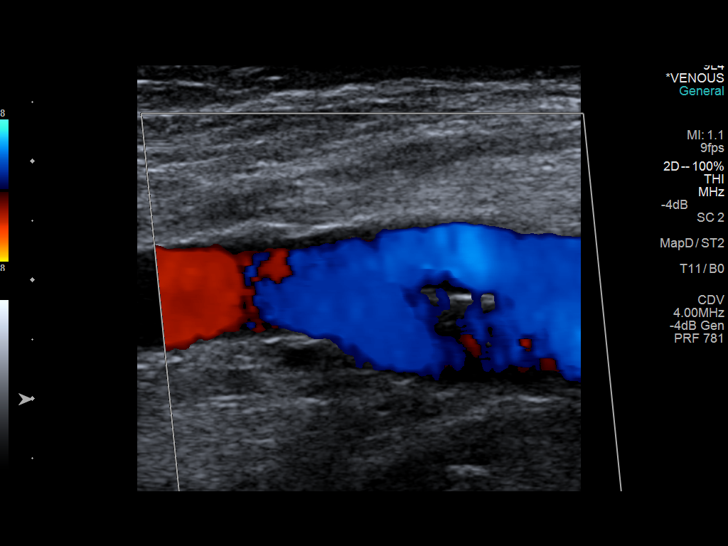
[im 34/46]
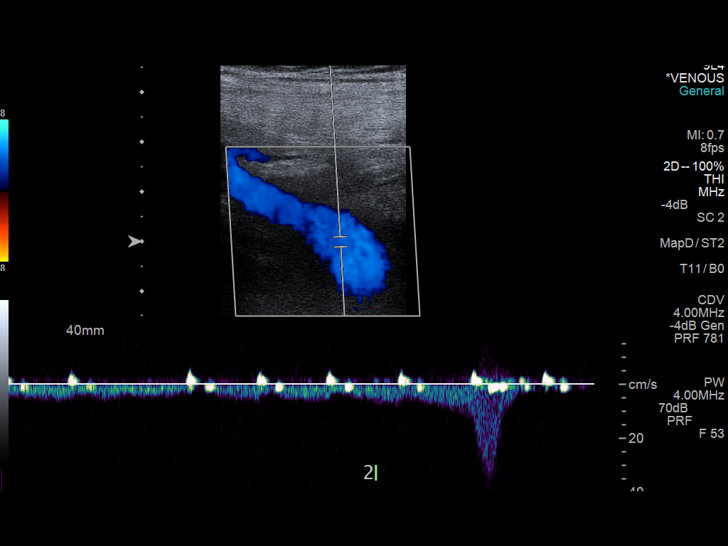
[im 38/46]
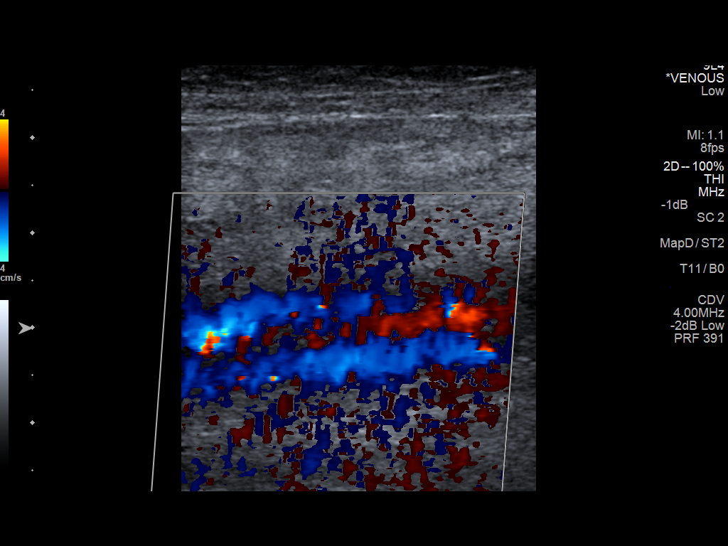
[im 42/46]
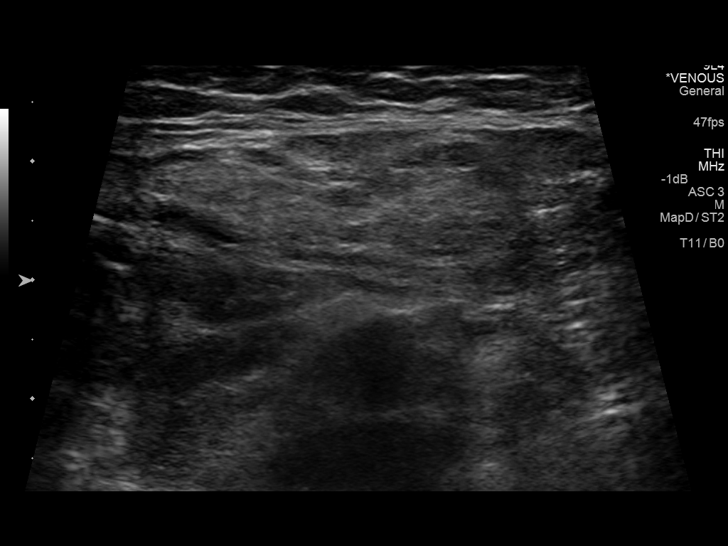
[im 46/46]
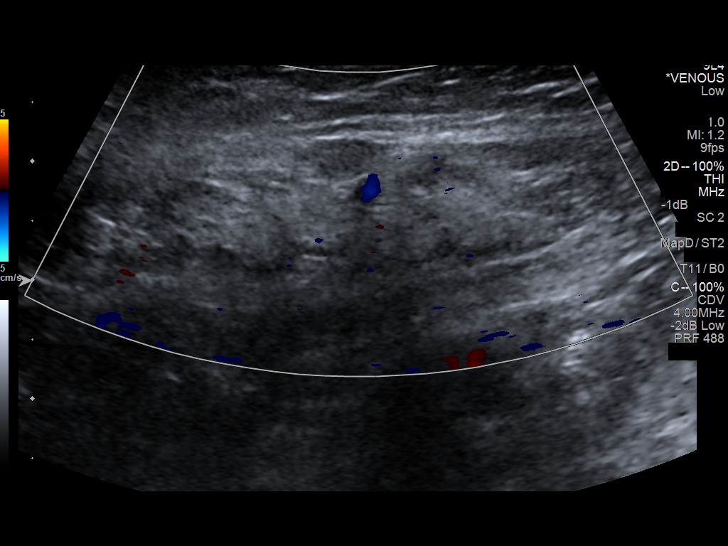

[13 of 24 positions shown; findings below may reference images not displayed]

FINDINGS: Contralateral Common Femoral Vein: Respiratory phasicity is normal
and symmetric with the symptomatic side. No evidence of thrombus.
Normal compressibility.

Common Femoral Vein: No evidence of thrombus. Normal
compressibility, respiratory phasicity and response to augmentation.

Saphenofemoral Junction: No evidence of thrombus. Normal
compressibility and flow on color Doppler imaging.

Profunda Femoral Vein: No evidence of thrombus. Normal
compressibility and flow on color Doppler imaging.

Femoral Vein: No evidence of thrombus. Normal compressibility,
respiratory phasicity and response to augmentation.

Popliteal Vein: No evidence of thrombus. Normal compressibility,
respiratory phasicity and response to augmentation.

Calf Veins: No evidence of thrombus. Normal compressibility and flow
on color Doppler imaging.

Superficial Great Saphenous Vein: No evidence of thrombus. Normal
compressibility and flow on color Doppler imaging.

Venous Reflux:  None.

Other Findings: Note is made of a benign appearing right inguinal
lymph node which maintains a fatty hilum.
IMPRESSION: No evidence of DVT within the right lower extremity.

## 2016-02-22 ENCOUNTER — Other Ambulatory Visit: Payer: Self-pay | Admitting: *Deleted

## 2016-02-22 DIAGNOSIS — E538 Deficiency of other specified B group vitamins: Secondary | ICD-10-CM

## 2016-02-23 ENCOUNTER — Other Ambulatory Visit: Payer: Self-pay | Admitting: Hematology and Oncology

## 2016-02-23 ENCOUNTER — Encounter: Payer: Self-pay | Admitting: Hematology and Oncology

## 2016-02-23 ENCOUNTER — Inpatient Hospital Stay: Payer: Medicare PPO | Attending: Hematology and Oncology | Admitting: Hematology and Oncology

## 2016-02-23 ENCOUNTER — Inpatient Hospital Stay: Payer: Medicare PPO

## 2016-02-23 VITALS — BP 165/86 | HR 73 | Temp 96.8°F | Resp 18

## 2016-02-23 VITALS — BP 134/75 | HR 76 | Temp 97.8°F | Resp 18 | Wt 228.4 lb

## 2016-02-23 DIAGNOSIS — Z79899 Other long term (current) drug therapy: Secondary | ICD-10-CM | POA: Diagnosis not present

## 2016-02-23 DIAGNOSIS — Z7982 Long term (current) use of aspirin: Secondary | ICD-10-CM | POA: Insufficient documentation

## 2016-02-23 DIAGNOSIS — K219 Gastro-esophageal reflux disease without esophagitis: Secondary | ICD-10-CM | POA: Diagnosis not present

## 2016-02-23 DIAGNOSIS — R748 Abnormal levels of other serum enzymes: Secondary | ICD-10-CM | POA: Diagnosis not present

## 2016-02-23 DIAGNOSIS — R1310 Dysphagia, unspecified: Secondary | ICD-10-CM | POA: Insufficient documentation

## 2016-02-23 DIAGNOSIS — Z8701 Personal history of pneumonia (recurrent): Secondary | ICD-10-CM | POA: Insufficient documentation

## 2016-02-23 DIAGNOSIS — E611 Iron deficiency: Secondary | ICD-10-CM

## 2016-02-23 DIAGNOSIS — D696 Thrombocytopenia, unspecified: Secondary | ICD-10-CM | POA: Insufficient documentation

## 2016-02-23 DIAGNOSIS — E538 Deficiency of other specified B group vitamins: Secondary | ICD-10-CM

## 2016-02-23 DIAGNOSIS — I499 Cardiac arrhythmia, unspecified: Secondary | ICD-10-CM

## 2016-02-23 DIAGNOSIS — R351 Nocturia: Secondary | ICD-10-CM

## 2016-02-23 DIAGNOSIS — I509 Heart failure, unspecified: Secondary | ICD-10-CM | POA: Diagnosis not present

## 2016-02-23 DIAGNOSIS — R9389 Abnormal findings on diagnostic imaging of other specified body structures: Secondary | ICD-10-CM

## 2016-02-23 DIAGNOSIS — N400 Enlarged prostate without lower urinary tract symptoms: Secondary | ICD-10-CM

## 2016-02-23 DIAGNOSIS — I10 Essential (primary) hypertension: Secondary | ICD-10-CM | POA: Diagnosis not present

## 2016-02-23 DIAGNOSIS — D538 Other specified nutritional anemias: Secondary | ICD-10-CM | POA: Diagnosis not present

## 2016-02-23 DIAGNOSIS — E785 Hyperlipidemia, unspecified: Secondary | ICD-10-CM | POA: Insufficient documentation

## 2016-02-23 DIAGNOSIS — D509 Iron deficiency anemia, unspecified: Secondary | ICD-10-CM | POA: Diagnosis not present

## 2016-02-23 LAB — CBC WITH DIFFERENTIAL/PLATELET
Basophils Absolute: 0.1 10*3/uL (ref 0–0.1)
Basophils Relative: 1 %
Eosinophils Absolute: 0.1 10*3/uL (ref 0–0.7)
Eosinophils Relative: 2 %
HCT: 37.9 % — ABNORMAL LOW (ref 40.0–52.0)
Hemoglobin: 12.6 g/dL — ABNORMAL LOW (ref 13.0–18.0)
Lymphocytes Relative: 21 %
Lymphs Abs: 1.2 10*3/uL (ref 1.0–3.6)
MCH: 30.6 pg (ref 26.0–34.0)
MCHC: 33.2 g/dL (ref 32.0–36.0)
MCV: 92 fL (ref 80.0–100.0)
Monocytes Absolute: 0.4 10*3/uL (ref 0.2–1.0)
Monocytes Relative: 6 %
Neutro Abs: 3.9 10*3/uL (ref 1.4–6.5)
Neutrophils Relative %: 70 %
Platelets: 130 10*3/uL — ABNORMAL LOW (ref 150–440)
RBC: 4.12 MIL/uL — ABNORMAL LOW (ref 4.40–5.90)
RDW: 14.2 % (ref 11.5–14.5)
WBC: 5.6 10*3/uL (ref 3.8–10.6)

## 2016-02-23 LAB — FERRITIN: Ferritin: 73 ng/mL (ref 24–336)

## 2016-02-23 LAB — FOLATE: Folate: 17.1 ng/mL (ref >5.9)

## 2016-02-23 LAB — VITAMIN B12: Vitamin B-12: 464 pg/mL (ref 180–914)

## 2016-02-23 MED ORDER — CYANOCOBALAMIN 1000 MCG/ML IJ SOLN
1000.0000 ug | Freq: Once | INTRAMUSCULAR | Status: AC
  Administered 2016-02-23: 1000 ug via INTRAMUSCULAR
  Filled 2016-02-23: qty 1

## 2016-02-23 NOTE — Progress Notes (Signed)
Patient here today for follow up regarding B-12 deficiency. Patient has been seen by Dr. Elenore RotaJuengel who ordered a swallow study. He has been instructed to eat small bites and take a swallow of water.

## 2016-02-23 NOTE — Progress Notes (Signed)
[facility]-  Cancer Center  Clinic day:  02/23/16   Chief Complaint: Jeremy Herrera is a 76 y.o. male with B12 deficiency, iron deficiency, and mild thrombocytopenia (ITP) who is seen for 6 month assessment.  HPI: The patient was last seen in the medical oncology clinic on 0705/2017.  At that time, he had ongoing swallowing difficulties.  Exam was stable.  Hematocrit was 38.7.  Ferritin was 49.  Folate was 20.2.  He received B12.  He was to continue oral iron.  CBC on 11/24/2015 revealed a hematocrit of 39.6, hemoglobin 13.2, MCV 92, platelets 130,000, WBC 5500 with an ANC of 3400.  Ferritin was 61.  He has been seen by several physicians during the interim (dr. Mickey Farber and Dr. Delfin Edis).  He is also seen by speech therapy.  He continues to take small bites and sips water.  Symptomatically, he has reflux.  Last EGD and colonoscopy by Dr. Mechele Collin was 2 years ago.     Past Medical History:  Diagnosis Date  . Anemia   . BPH (benign prostatic hyperplasia)   . CHF (congestive heart failure) (H[facility])   . Dysphagia   . GERD (gastroesophageal reflux disease)   . HLD (hyperlipidemia)   . HTN (hypertension)   . Irregular heart beat   . Nocturia   . Urinary retention     Past Surgical History:  Procedure Laterality Date  . bladder patching  1964  . HIP SURGERY Right 2014  . TRANSURETHRAL RESECTION OF PROSTATE      Family History  Problem Relation Age of Onset  . Ulcers Mother     stomach  . GER disease Sister     GERD  . Heart attack Father   . Diabetes Brother   . Depression Brother   . Kidney disease Neg Hx   . Prostate cancer Neg Hx     Social History:  reports that he has never smoked. He has never used smokeless tobacco. He reports that he does not drink alcohol or use drugs.   He lives in [place].  He is alone today.  Allergies:  Allergies  Allergen Reactions  . Hydrocodone-Acetaminophen Nausea And Vomiting    Current Medications: Current  Outpatient Prescriptions  Medication Sig Dispense Refill  . acetaminophen (TYLENOL) 500 MG tablet Take by mouth.    Marland Kitchen aspirin 81 MG tablet Take 81 mg by mouth daily.    . bimatoprost (LUMIGAN) 0.01 % SOLN Apply to eye.    . Cyanocobalamin (VITAMIN B-12 IJ) Inject as directed every 30 (thirty) days.    . dorzolamide-timolol (COSOPT) 22.3-6.8 MG/ML ophthalmic solution Apply to eye.    . dutasteride (AVODART) 0.5 MG capsule Take 1 capsule (0.5 mg total) by mouth daily. 30 capsule 12  . esomeprazole (NEXIUM) 20 MG capsule Take 20 mg by mouth daily at 12 noon.    . ferrous sulfate 325 (65 FE) MG tablet Take 325 mg by mouth daily with breakfast.    . furosemide (LASIX) 20 MG tablet Take by mouth.    . gabapentin (NEURONTIN) 100 MG capsule     . Naproxen Sodium (ALEVE) 220 MG CAPS Take 1 capsule by mouth as needed.    . tadalafil (CIALIS) 5 MG tablet Take 1 tablet (5 mg total) by mouth daily. 30 tablet 12  . vitamin B-12 (CYANOCOBALAMIN) 1000 MCG tablet Take by mouth every 30 (thirty) days. Reported on 07/08/2015     No current facility-administered medications for this visit.  Review of Systems:  GENERAL:  Feels "good".  Active (chopping wood).  No fevers or sweats.  Weight up 6 pounds. PERFORMANCE STATUS (ECOG): 1 HEENT:  No visual changes, runny nose, sore throat, mouth sores or tenderness. Lungs:  Shortness of breath with exertion.  No cough.  No hemoptysis. Cardiac:  No chest pain, palpitations, orthopnea, or PND. GI:   Swallowing difficulties.  No nausea, vomiting, diarrhea, constipation, melena or hematochezia. GU:  No urgency, frequency, dysuria, or hematuria. Musculoskeletal:  No back pain.  No joint pain.  No muscle tenderness. Extremities:  No pain or swelling. Skin:  No rashes or skin changes. Neuro:  No headache, numbness or weakness, balance or coordination issues. Endocrine:  No diabetes, thyroid issues, hot flashes or night sweats. Psych:  No mood changes, depression or  anxiety. Pain:  No focal pain. Review of systems:  All other systems reviewed and found to be negative.  Physical Exam: Blood pressure 134/75, pulse 76, temperature 97.8 F (36.6 C), temperature source Tympanic, resp. rate 18, weight 228 lb 6.3 oz (103.6 kg). GENERAL:  Well developed, well nourished, tall gentleman sitting comfortably in the exam room in no acute distress.  He has a cane at his side. MENTAL STATUS:  Alert and oriented to person, place and time. HEAD:  Wallace CullensGray hair.  Male pattern baldness.  Normocephalic, atraumatic, face symmetric, no Cushingoid features. EYES:  Glasses.  Blue eyes. Pupils equal round and reactive to light and accomodation.  No conjunctivitis or scleral icterus. ENT:  Clears throat regularly.  Oropharynx clear without lesion.  Tongue normal. Mucous membranes moist.  RESPIRATORY:  Clear to auscultation without rales, wheezes or rhonchi. CARDIOVASCULAR:  Regular rate and rhythm without murmur, rub or gallop. ABDOMEN:  Soft, non-tender, with active bowel sounds, and no hepatosplenomegaly.  No masses. SKIN: No rashes, ulcers or lesions. EXTREMITIES: No edema, no skin discoloration or tenderness.  No palpable cords. LYMPH NODES: No palpable cervical, supraclavicular, axillary or inguinal adenopathy  NEUROLOGICAL: Unremarkable. PSYCH:  Appropriate.    Appointment on 02/23/2016  Component Date Value Ref Range Status  . WBC 02/23/2016 5.6  3.8 - 10.6 K/uL Final  . RBC 02/23/2016 4.12* 4.40 - 5.90 MIL/uL Final  . Hemoglobin 02/23/2016 12.6* 13.0 - 18.0 g/dL Final  . HCT 91/47/829501/04/2016 37.9* 40.0 - 52.0 % Final  . MCV 02/23/2016 92.0  80.0 - 100.0 fL Final  . MCH 02/23/2016 30.6  26.0 - 34.0 pg Final  . MCHC 02/23/2016 33.2  32.0 - 36.0 g/dL Final  . RDW 62/13/086501/04/2016 14.2  11.5 - 14.5 % Final  . Platelets 02/23/2016 130* 150 - 440 K/uL Final  . Neutrophils Relative % 02/23/2016 70  % Final  . Neutro Abs 02/23/2016 3.9  1.4 - 6.5 K/uL Final  . Lymphocytes Relative  02/23/2016 21  % Final  . Lymphs Abs 02/23/2016 1.2  1.0 - 3.6 K/uL Final  . Monocytes Relative 02/23/2016 6  % Final  . Monocytes Absolute 02/23/2016 0.4  0.2 - 1.0 K/uL Final  . Eosinophils Relative 02/23/2016 2  % Final  . Eosinophils Absolute 02/23/2016 0.1  0 - 0.7 K/uL Final  . Basophils Relative 02/23/2016 1  % Final  . Basophils Absolute 02/23/2016 0.1  0 - 0.1 K/uL Final    Assessment:  Elenor QuinonesRichard L Wahid is a 76 y.o. 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  He denies any new medications or herbal products. He has no known  autoimmune disease. He has never required transfusion.   Work-up on 09/16/2014 revealed a hematocrit 36.4, hemoglobin 12.3, MCV 91.5, platelets 124,000 white count 4800 with an ANC of 3000. Differential was unremarkable. B12 was 262 (low normal) with an elevated MMA consistent with B12 deficiency.  He began weekly 10/14/2014 (last 07/28/2015).  Normal labs included: folate, CMP, ANA, iron studies, hepatitis C antibody, hepatitis B surface antigen and core antibody, HIV testing, SPEP, free light chains, and PTT. TSH  was normal on 08/25/2014.  UPEP revealed no monoclonal protein.  He has a history of iron deficiency anemia.  EGD and colonoscopy 10 years ago were negative. He required esophageal dilatation. He has a history of reflux. Colonoscopy 3-4 years ago was unremarkable per the patient.  His diet is good.  He eats meat twice a week.  He is on oral iron.  He was diagnosed with a community acquired pneumonia on 05/20/2015.  He is completing a Z pack.  Chest CT angiogram on 05/20/2015 revealed a nodular focus of consolidation (indeterminate) for which neoplasm could not be completely excluded. Recommendation was for chest CT without contrast 1-2 months.   Chest CT on 07/05/2015 revealed resolved medial right lower lobe consolidation with residual mild ground-glass opacity.   There was mild patchy subpleural reticulation and ground-glass attenuation in both lungs, cannot exclude interstitial lung disease such as nonspecific interstitial pneumonia (NSIP).   He has a history of an elevated alkaline phosphatase (138-151) without trend since 09/2011.  Symptomatically, he has ongoing swallowing difficulties.  Exam is stable.  Hematocrit is 37.9. Platelet count is 130,000.  Ferritin is 73.  Plan: 1.  Labs today:  CBC with diff, ferritin, folate. 2.  B12 today and monthly x 5. 3.  Continue oral iron until ferritin is > 100. 4.  High resolution chest CT on 07/04/2016. 5.  RTC in 3 months for labs (CBC with diff, ferritin) + B12 6.  RTC in 6 months for MD assessment, labs (CBC with diff, ferritin, folate) + B12.   Rosey Bath, MD  02/23/2016, 10:35 AM

## 2016-02-23 NOTE — Patient Instructions (Signed)
Cyanocobalamin, Vitamin B12 injection What is this medicine? CYANOCOBALAMIN (sye an oh koe BAL a min) is a man made form of vitamin B12. Vitamin B12 is used in the growth of healthy blood cells, nerve cells, and proteins in the body. It also helps with the metabolism of fats and carbohydrates. This medicine is used to treat people who can not absorb vitamin B12. This medicine may be used for other purposes; ask your health care provider or pharmacist if you have questions. COMMON BRAND NAME(S): B-12 Compliance Kit, B-12 Injection Kit, Cyomin, LA-12, Nutri-Twelve, Physicians EZ Use B-12, Primabalt What should I tell my health care provider before I take this medicine? They need to know if you have any of these conditions: -kidney disease -Leber's disease -megaloblastic anemia -an unusual or allergic reaction to cyanocobalamin, cobalt, other medicines, foods, dyes, or preservatives -pregnant or trying to get pregnant -breast-feeding How should I use this medicine? This medicine is injected into a muscle or deeply under the skin. It is usually given by a health care professional in a clinic or doctor's office. However, your doctor may teach you how to inject yourself. Follow all instructions. Talk to your pediatrician regarding the use of this medicine in children. Special care may be needed. Overdosage: If you think you have taken too much of this medicine contact a poison control center or emergency room at once. NOTE: This medicine is only for you. Do not share this medicine with others. What if I miss a dose? If you are given your dose at a clinic or doctor's office, call to reschedule your appointment. If you give your own injections and you miss a dose, take it as soon as you can. If it is almost time for your next dose, take only that dose. Do not take double or extra doses. What may interact with this medicine? -colchicine -heavy alcohol intake This list may not describe all possible  interactions. Give your health care provider a list of all the medicines, herbs, non-prescription drugs, or dietary supplements you use. Also tell them if you smoke, drink alcohol, or use illegal drugs. Some items may interact with your medicine. What should I watch for while using this medicine? Visit your doctor or health care professional regularly. You may need blood work done while you are taking this medicine. You may need to follow a special diet. Talk to your doctor. Limit your alcohol intake and avoid smoking to get the best benefit. What side effects may I notice from receiving this medicine? Side effects that you should report to your doctor or health care professional as soon as possible: -allergic reactions like skin rash, itching or hives, swelling of the face, lips, or tongue -blue tint to skin -chest tightness, pain -difficulty breathing, wheezing -dizziness -red, swollen painful area on the leg Side effects that usually do not require medical attention (report to your doctor or health care professional if they continue or are bothersome): -diarrhea -headache This list may not describe all possible side effects. Call your doctor for medical advice about side effects. You may report side effects to FDA at 1-800-FDA-1088. Where should I keep my medicine? Keep out of the reach of children. Store at room temperature between 15 and 30 degrees C (59 and 85 degrees F). Protect from light. Throw away any unused medicine after the expiration date. NOTE: This sheet is a summary. It may not cover all possible information. If you have questions about this medicine, talk to your doctor, pharmacist, or   health care provider.  2017 Elsevier/Gold Standard (2007-05-20 22:10:20)  

## 2016-03-29 ENCOUNTER — Inpatient Hospital Stay: Payer: Medicare PPO | Attending: Hematology and Oncology

## 2016-03-29 VITALS — BP 154/92 | HR 96 | Resp 18

## 2016-03-29 DIAGNOSIS — E538 Deficiency of other specified B group vitamins: Secondary | ICD-10-CM | POA: Insufficient documentation

## 2016-03-29 DIAGNOSIS — Z79899 Other long term (current) drug therapy: Secondary | ICD-10-CM | POA: Diagnosis not present

## 2016-03-29 MED ORDER — CYANOCOBALAMIN 1000 MCG/ML IJ SOLN
1000.0000 ug | Freq: Once | INTRAMUSCULAR | Status: AC
  Administered 2016-03-29: 1000 ug via INTRAMUSCULAR
  Filled 2016-03-29: qty 1

## 2016-04-12 ENCOUNTER — Emergency Department
Admission: EM | Admit: 2016-04-12 | Discharge: 2016-04-12 | Disposition: A | Discharge status: Home / Self Care | Payer: Medicare PPO | Attending: Emergency Medicine | Admitting: Emergency Medicine

## 2016-04-12 ENCOUNTER — Other Ambulatory Visit: Payer: Self-pay

## 2016-04-12 ENCOUNTER — Encounter: Payer: Self-pay | Admitting: *Deleted

## 2016-04-12 DIAGNOSIS — Z7982 Long term (current) use of aspirin: Secondary | ICD-10-CM | POA: Insufficient documentation

## 2016-04-12 DIAGNOSIS — I509 Heart failure, unspecified: Secondary | ICD-10-CM | POA: Diagnosis not present

## 2016-04-12 DIAGNOSIS — Z79899 Other long term (current) drug therapy: Secondary | ICD-10-CM | POA: Diagnosis not present

## 2016-04-12 DIAGNOSIS — I110 Hypertensive heart disease with heart failure: Secondary | ICD-10-CM | POA: Insufficient documentation

## 2016-04-12 DIAGNOSIS — E119 Type 2 diabetes mellitus without complications: Secondary | ICD-10-CM | POA: Insufficient documentation

## 2016-04-12 DIAGNOSIS — R252 Cramp and spasm: Secondary | ICD-10-CM | POA: Diagnosis present

## 2016-04-12 DIAGNOSIS — R609 Edema, unspecified: Secondary | ICD-10-CM | POA: Diagnosis not present

## 2016-04-12 LAB — COMPREHENSIVE METABOLIC PANEL
ALK PHOS: 105 U/L (ref 38–126)
ALT: 15 U/L — AB (ref 17–63)
AST: 18 U/L (ref 15–41)
Albumin: 3.7 g/dL (ref 3.5–5.0)
Anion gap: 5 (ref 5–15)
BUN: 26 mg/dL — AB (ref 6–20)
CALCIUM: 8.6 mg/dL — AB (ref 8.9–10.3)
CO2: 23 mmol/L (ref 22–32)
CREATININE: 1.11 mg/dL (ref 0.61–1.24)
Chloride: 111 mmol/L (ref 101–111)
GFR calc non Af Amer: >60 mL/min (ref >60)
GLUCOSE: 114 mg/dL — AB (ref 65–99)
Potassium: 4.3 mmol/L (ref 3.5–5.1)
SODIUM: 139 mmol/L (ref 135–145)
Total Bilirubin: 0.8 mg/dL (ref 0.3–1.2)
Total Protein: 6.7 g/dL (ref 6.5–8.1)

## 2016-04-12 LAB — CBC
HEMATOCRIT: 38.7 % — AB (ref 40.0–52.0)
HEMOGLOBIN: 13.5 g/dL (ref 13.0–18.0)
MCH: 31.2 pg (ref 26.0–34.0)
MCHC: 34.8 g/dL (ref 32.0–36.0)
MCV: 89.6 fL (ref 80.0–100.0)
Platelets: 130 10*3/uL — ABNORMAL LOW (ref 150–440)
RBC: 4.31 MIL/uL — AB (ref 4.40–5.90)
RDW: 14.1 % (ref 11.5–14.5)
WBC: 6 10*3/uL (ref 3.8–10.6)

## 2016-04-12 LAB — TROPONIN I: Troponin I: <0.03 ng/mL (ref <0.03)

## 2016-04-12 MED ORDER — ONDANSETRON HCL 4 MG/2ML IJ SOLN
4.0000 mg | Freq: Once | INTRAMUSCULAR | Status: AC
  Administered 2016-04-12: 4 mg via INTRAVENOUS
  Filled 2016-04-12: qty 2

## 2016-04-12 MED ORDER — DEXAMETHASONE SODIUM PHOSPHATE 10 MG/ML IJ SOLN
10.0000 mg | Freq: Once | INTRAMUSCULAR | Status: AC
  Administered 2016-04-12: 10 mg via INTRAVENOUS
  Filled 2016-04-12: qty 1

## 2016-04-12 MED ORDER — SODIUM CHLORIDE 0.9 % IV SOLN
Freq: Once | INTRAVENOUS | Status: AC
  Administered 2016-04-12: 10:00:00 via INTRAVENOUS

## 2016-04-12 MED ORDER — KETOROLAC TROMETHAMINE 30 MG/ML IJ SOLN
15.0000 mg | Freq: Once | INTRAMUSCULAR | Status: AC
  Administered 2016-04-12: 15 mg via INTRAVENOUS
  Filled 2016-04-12: qty 1

## 2016-04-12 MED ORDER — HYDROMORPHONE HCL 1 MG/ML IJ SOLN
0.5000 mg | Freq: Once | INTRAMUSCULAR | Status: AC
  Administered 2016-04-12: 0.5 mg via INTRAVENOUS
  Filled 2016-04-12: qty 1

## 2016-04-12 MED ORDER — CYCLOBENZAPRINE HCL 10 MG PO TABS: 10.0000 mg | ORAL_TABLET | Freq: Three times a day (TID) | ORAL | 0 refills | Status: DC | PRN

## 2016-04-12 MED ORDER — OXYCODONE-ACETAMINOPHEN 5-325 MG PO TABS: 2.0000 | ORAL_TABLET | Freq: Four times a day (QID) | ORAL | 0 refills | Status: DC | PRN

## 2016-04-12 NOTE — ED Provider Notes (Addendum)
[facility] Emergency Department Provider Note        Time seen: ----------------------------------------- 9:13 AM on 04/12/2016 -----------------------------------------    I have reviewed the triage vital signs and the nursing notes.   HISTORY  Chief Complaint Leg Swelling and cramping hands/feet    HPI Jeremy Herrera is a 76 y.o. male who presents to ER for cramping of his hands and feet, swelling to both legs and feet as well as increased difficulty with ambulation. Symptoms have been present for several days. Patient notes he takes Lasix for peripheral edema. Patient took a dose of Lasix on Saturday and Sunday as well as on Tuesday. He's been advised to take no more than 3 or 4 week. He denies any recent illness, does have particular pain on both upper legs posteriorly.   Past Medical History:  Diagnosis Date  . Anemia   . BPH (benign prostatic hyperplasia)   . CHF (congestive heart failure) (HCC)   . Dysphagia   . GERD (gastroesophageal reflux disease)   . HLD (hyperlipidemia)   . HTN (hypertension)   . Irregular heart beat   . Nocturia   . Urinary retention     Patient Active Problem List   Diagnosis Date Noted  . Nocturia 10/17/2014  . History of urinary retention 10/17/2014  . 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  . B12 deficiency 09/30/2014  . Right leg swelling 09/30/2014  . Anemia 09/16/2014  . Thrombocytopenia (HCC) 09/16/2014  . Benign essential HTN 06/08/2014  . 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 Beat, premature ventricular 12/03/2013  . Heart valve disease 12/03/2013  . 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 Diabetes mellitus, type 2 (HCC) 11/20/2013  . Acid reflux 11/20/2013  . Anemia, iron deficiency 11/20/2013  . Abnormal presence of protein in urine 11/20/2013  . 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 bladder patching  1964  . HIP SURGERY Right 2014  . TRANSURETHRAL RESECTION OF PROSTATE      Allergies Hydrocodone-acetaminophen  Social History Social History  Substance Use Topics  . Smoking status: Never Smoker  . Smokeless tobacco: Never Used  . Alcohol use No    Review of Systems Constitutional: Negative for fever. Cardiovascular: Negative for chest pain. Respiratory: Negative for shortness of breath. Gastrointestinal: Negative for abdominal pain, vomiting and diarrhea. Genitourinary: Negative for dysuria. Musculoskeletal:Positive for bilateral leg pain and cramping, positive for edema Skin: Negative for rash. Neurological: Negative for headaches, focal weakness or numbness.  10-point ROS otherwise negative.  ____________________________________________   PHYSICAL EXAM:  VITAL SIGNS: ED Triage Vitals  Enc Vitals Group     BP 04/12/16 0849 (!) 156/132     Pulse Rate 04/12/16 0849 98     Resp 04/12/16 0849 (!) 22     Temp 04/12/16 0844 98.3 F (36.8 C)     Temp Source 04/12/16 0844 Oral     SpO2 04/12/16 0849 99 %     Weight 04/12/16 0849 217 lb (98.4 kg)     Height 04/12/16 0849 6\' 2"  (1.88 m)     Head Circumference --      Peak Flow --      Pain Score 04/12/16 0901 7     Pain Loc --      Pain Edu? --      Excl. in GC? --     Constitutional: Alert  and oriented. Well appearing and in no distress. Eyes: Conjunctivae are normal. PERRL. Normal extraocular movements. ENT   Head: Normocephalic and atraumatic.   Nose: No congestion/rhinnorhea.   Mouth/Throat: Mucous membranes are moist.   Neck: No stridor. Cardiovascular: Normal rate, regular rhythm. No murmurs, rubs, or gallops. Respiratory: Normal respiratory effort without tachypnea nor retractions. Breath sounds are clear and equal bilaterally. No wheezes/rales/rhonchi. Gastrointestinal: Soft and nontender. Normal bowel sounds Musculoskeletal: Pain  with range of motion of both legs, pain seems to be worse with elevation of the left leg. Positive straight leg raise examination. Neurologic:  Normal speech and language. No gross focal neurologic deficits are appreciated.  Skin:  Skin is warm, dry and intact. No rash noted. Psychiatric: Mood and affect are normal. Speech and behavior are normal.  ____________________________________________  EKG: Interpreted by me. Sinus rhythm rate of 95 bpm, normal PR interval, normal QRS, normal QT.  ____________________________________________  ED COURSE:  Pertinent labs & imaging results that were available during my care of the patient were reviewed by me and considered in my medical decision making (see chart for details).    Procedures ____________________________________________   LABS (pertinent positives/negatives)  Labs Reviewed  CBC - Abnormal; Notable for the following:       Result Value   RBC 4.31 (*)    HCT 38.7 (*)    Platelets 130 (*)    All other components within normal limits  COMPREHENSIVE METABOLIC PANEL - Abnormal; Notable for the following:    Glucose, Bld 114 (*)    BUN 26 (*)    Calcium 8.6 (*)    ALT 15 (*)    All other components within normal limits  TROPONIN I   ____________________________________________  FINAL ASSESSMENT AND PLAN  Edema, muscle cramps, Possible sciatica  Plan: Patient with labs and imaging as dictated above. No clear etiology for the patient's edema is identified. Echocardiogram was reviewed from 2 years ago which was normal. He has cardiology follow-up in 7 days. His renal function is normal and ABIs review normal arterial flow. He will be prescribed muscle relaxants, he will be encouraged to hold Lasix and return for worsening symptoms.   Emily FilbertWilliams, Latrece Nitta E, MD   Note: This note was generated in part or whole with voice recognition software. Voice recognition is usually quite accurate but there are transcription errors that can  and very often do occur. I apologize for any typographical errors that were not detected and corrected.     Emily FilbertJonathan E Savahna Casados, MD 04/12/16 1226    Emily FilbertJonathan E Ermel Verne, MD 04/12/16 [phone]

## 2016-04-12 NOTE — ED Notes (Signed)
Bilateral hand and foot cramping. Right hand obviously cramping at time of this RN assessment. Mild swelling to lower legs, sock imprints left when taken off. EDP at bedside. PT takes lasix for swelling, took 3 times this week. Ordered no more than 3-4 times per day.

## 2016-04-12 NOTE — ED Triage Notes (Signed)
Pt complains of cramping of hands/feet, swelling to bilateral legs and feet, pt has increased difficulty with ambulation

## 2016-04-26 ENCOUNTER — Inpatient Hospital Stay: Payer: Medicare PPO | Attending: Hematology and Oncology

## 2016-04-26 VITALS — BP 138/82 | HR 96 | Resp 20

## 2016-04-26 DIAGNOSIS — E538 Deficiency of other specified B group vitamins: Secondary | ICD-10-CM | POA: Diagnosis present

## 2016-04-26 MED ORDER — CYANOCOBALAMIN 1000 MCG/ML IJ SOLN
1000.0000 ug | Freq: Once | INTRAMUSCULAR | Status: AC
  Administered 2016-04-26: 1000 ug via INTRAMUSCULAR
  Filled 2016-04-26: qty 1

## 2016-04-26 NOTE — Patient Instructions (Signed)
Cyanocobalamin, Vitamin B12 injection What is this medicine? CYANOCOBALAMIN (sye an oh koe BAL a min) is a man made form of vitamin B12. Vitamin B12 is used in the growth of healthy blood cells, nerve cells, and proteins in the body. It also helps with the metabolism of fats and carbohydrates. This medicine is used to treat people who can not absorb vitamin B12. This medicine may be used for other purposes; ask your health care provider or pharmacist if you have questions. COMMON BRAND NAME(S): B-12 Compliance Kit, B-12 Injection Kit, Cyomin, LA-12, Nutri-Twelve, Physicians EZ Use B-12, Primabalt What should I tell my health care provider before I take this medicine? They need to know if you have any of these conditions: -kidney disease -Leber's disease -megaloblastic anemia -an unusual or allergic reaction to cyanocobalamin, cobalt, other medicines, foods, dyes, or preservatives -pregnant or trying to get pregnant -breast-feeding How should I use this medicine? This medicine is injected into a muscle or deeply under the skin. It is usually given by a health care professional in a clinic or doctor's office. However, your doctor may teach you how to inject yourself. Follow all instructions. Talk to your pediatrician regarding the use of this medicine in children. Special care may be needed. Overdosage: If you think you have taken too much of this medicine contact a poison control center or emergency room at once. NOTE: This medicine is only for you. Do not share this medicine with others. What if I miss a dose? If you are given your dose at a clinic or doctor's office, call to reschedule your appointment. If you give your own injections and you miss a dose, take it as soon as you can. If it is almost time for your next dose, take only that dose. Do not take double or extra doses. What may interact with this medicine? -colchicine -heavy alcohol intake This list may not describe all possible  interactions. Give your health care provider a list of all the medicines, herbs, non-prescription drugs, or dietary supplements you use. Also tell them if you smoke, drink alcohol, or use illegal drugs. Some items may interact with your medicine. What should I watch for while using this medicine? Visit your doctor or health care professional regularly. You may need blood work done while you are taking this medicine. You may need to follow a special diet. Talk to your doctor. Limit your alcohol intake and avoid smoking to get the best benefit. What side effects may I notice from receiving this medicine? Side effects that you should report to your doctor or health care professional as soon as possible: -allergic reactions like skin rash, itching or hives, swelling of the face, lips, or tongue -blue tint to skin -chest tightness, pain -difficulty breathing, wheezing -dizziness -red, swollen painful area on the leg Side effects that usually do not require medical attention (report to your doctor or health care professional if they continue or are bothersome): -diarrhea -headache This list may not describe all possible side effects. Call your doctor for medical advice about side effects. You may report side effects to FDA at 1-800-FDA-1088. Where should I keep my medicine? Keep out of the reach of children. Store at room temperature between 15 and 30 degrees C (59 and 85 degrees F). Protect from light. Throw away any unused medicine after the expiration date. NOTE: This sheet is a summary. It may not cover all possible information. If you have questions about this medicine, talk to your doctor, pharmacist, or   health care provider.  2018 Elsevier/Gold Standard (2007-05-20 22:10:20)  

## 2016-05-05 ENCOUNTER — Ambulatory Visit
Admission: EM | Admit: 2016-05-05 | Discharge: 2016-05-05 | Disposition: A | Discharge status: Home / Self Care | Payer: Medicare PPO | Attending: Family Medicine | Admitting: Family Medicine

## 2016-05-05 ENCOUNTER — Encounter: Payer: Self-pay | Admitting: Emergency Medicine

## 2016-05-05 DIAGNOSIS — I5032 Chronic diastolic (congestive) heart failure: Secondary | ICD-10-CM | POA: Diagnosis not present

## 2016-05-05 DIAGNOSIS — S39012A Strain of muscle, fascia and tendon of lower back, initial encounter: Secondary | ICD-10-CM

## 2016-05-05 DIAGNOSIS — R600 Localized edema: Secondary | ICD-10-CM | POA: Diagnosis not present

## 2016-05-05 MED ORDER — MUPIROCIN 2 % EX OINT: 1.0000 "application " | TOPICAL_OINTMENT | Freq: Three times a day (TID) | CUTANEOUS | 0 refills | Status: DC

## 2016-05-05 NOTE — ED Triage Notes (Signed)
Patient reports swelling in his feet, back pain and balance problems for over 2 weeks.  Patient reports that he has been prescribed a fluid pill for his swelling.

## 2016-05-05 NOTE — ED Provider Notes (Signed)
CSN: [id number]     Arrival date & time 05/05/16  34 History   First MD Initiated Contact with Patient 05/05/16 1614     Chief Complaint  Patient presents with  . Foot Swelling   (Consider location/radiation/quality/duration/timing/severity/associated sxs/prior Treatment) HPI  This a 76 year old male who presents with several issues including swelling of his feet back pain and balance problems that he's had for over 2 weeks. He does take furosemide for his swelling. 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 He has diabetes mellitus ; deficiency anemia peripheral sensory neuropathy ,valvular heart disease. He states that he has been chopping wood for 5 days. He's been loading the wood into a [place] cart. He states that during activity did not feel bad but afterwards shortly afterwards he began having the low back pain is nonradiating.       Past Medical History:  Diagnosis Date  . Anemia   . BPH (benign prostatic hyperplasia)   . CHF (congestive heart failure) ([place])   . Dysphagia   . GERD (gastroesophageal reflux disease)   . HLD (hyperlipidemia)   . HTN (hypertension)   . Irregular heart beat   . Nocturia   . Urinary retention    Past Surgical History:  Procedure Laterality Date  . bladder patching  1964  . HIP SURGERY Right 2014  . TRANSURETHRAL RESECTION OF PROSTATE     Family History  Problem Relation Age of Onset  . Ulcers Mother     stomach  . GER disease Sister     GERD  . Heart attack Father   . Diabetes Brother   . Depression Brother   . Kidney disease Neg Hx   . Prostate cancer Neg Hx    Social History  Substance Use Topics  . Smoking status: Never Smoker  . Smokeless tobacco: Never Used  . Alcohol use No    Review of Systems  Constitutional: Positive for activity change. Negative for chills, fatigue and fever.  Respiratory: Positive for shortness of breath. Negative for cough.   Musculoskeletal:  Positive for back pain and gait problem.  All other systems reviewed and are negative.   Allergies  Hydrocodone-acetaminophen  Home Medications   Prior to Admission medications   Medication Sig Start Date End Date Taking? Authorizing Provider  acetaminophen (TYLENOL) 500 MG tablet Take by mouth.    Historical Provider, MD  aspirin 81 MG tablet Take 81 mg by mouth daily.    Historical Provider, MD  bimatoprost (LUMIGAN) 0.01 % SOLN Place 1 drop into both eyes 2 (two) times daily.     Historical Provider, MD  Cyanocobalamin (B-12) 1000 MCG/ML KIT Inject 1 mL as directed every 30 (thirty) days.    Historical Provider, MD  cyclobenzaprine (FLEXERIL) 10 MG tablet Take 1 tablet (10 mg total) by mouth 3 (three) times daily as needed for muscle spasms. 04/12/16   Earleen Newport, MD  dorzolamide-timolol (COSOPT) 22.3-6.8 MG/ML ophthalmic solution Place 1 drop into both eyes 2 (two) times daily.     Historical Provider, MD  dutasteride (AVODART) 0.5 MG capsule Take 1 capsule (0.5 mg total) by mouth daily. 07/08/15   Nori Riis, PA-C  esomeprazole (NEXIUM) 20 MG capsule Take 20 mg by mouth daily at 12 noon.    Historical Provider, MD  ferrous sulfate 325 (65 FE) MG tablet Take 325 mg by mouth daily.    Historical Provider, MD  furosemide (LASIX) 20 MG  tablet Take 20 mg by mouth daily as needed for fluid. Try to limit to no more than 3-4 times per week. 11/23/14   Historical Provider, MD  gabapentin (NEURONTIN) 100 MG capsule  04/01/15   Historical Provider, MD  mupirocin ointment (BACTROBAN) 2 % Apply 1 application topically 3 (three) times daily. 05/05/16   [name], PA-C  Naproxen Sodium (ALEVE) 220 MG CAPS Take 1 capsule by mouth as needed.    Historical Provider, MD  oxyCODONE-acetaminophen (PERCOCET) 5-325 MG tablet Take 2 tablets by mouth every 6 (six) hours as needed for moderate pain or severe pain. 04/12/16   Earleen Newport, MD  tadalafil (CIALIS) 5 MG tablet Take 1 tablet (5  mg total) by mouth daily. 07/08/15   Nori Riis, PA-C   Meds Ordered and Administered this Visit  Medications - No data to display  BP (!) 147/84 (BP Location: Left Arm)   Pulse 98   Temp 97.5 F (36.4 C) (Oral)   Resp 16   Ht 6' 2"  (1.88 m)   Wt 207 lb (93.9 kg)   SpO2 100%   BMI 26.58 kg/m  No data found.   Physical Exam  Constitutional: He is oriented to person, place, and time. He appears well-developed and well-nourished. No distress.  HENT:  Head: Normocephalic and atraumatic.  Is an abrasion on his chin right side. He fell in some loose rock striking his chin on the rock  Eyes: Pupils are equal, round, and reactive to light. Right eye exhibits no discharge. Left eye exhibits no discharge.  Neck: Normal range of motion.  Pulmonary/Chest: Effort normal and breath sounds normal. No respiratory distress. He has no wheezes. He has no rales.  Musculoskeletal: He exhibits edema and tenderness.  Patient was in a wheelchair. Patient does have tenderness along the left sacroiliac joint and lower lumbar segments in the paraspinous muscles reproducing his symptoms. She was not performed due to patient being in a wheelchair. She has 1+ pitting edema pretibial.  Neurological: He is alert and oriented to person, place, and time.  Skin: Skin is warm and dry. He is not diaphoretic.  Psychiatric: He has a normal mood and affect. His behavior is normal. Judgment and thought content normal.  Nursing note and vitals reviewed.   Urgent Care Course     Procedures (including critical care time)  Labs Review Labs Reviewed - No data to display  Imaging Review No results found.   Visual Acuity Review  Right Eye Distance:   Left Eye Distance:   Bilateral Distance:    Right Eye Near:   Left Eye Near:    Bilateral Near:         MDM   1. CHF (congestive heart failure), NYHA class III, chronic, diastolic ([place])   2. Acute myofascial strain of lumbar region, initial  encounter   3. Pedal edema    Discharge Medication List as of 05/05/2016  4:48 PM    START taking these medications   Details  mupirocin ointment (BACTROBAN) 2 % Apply 1 application topically 3 (three) times daily., Starting Fri 05/05/2016, Normal       Plan: 1. Test/x-ray results and diagnosis reviewed with patient 2. rx as per orders; risks, benefits, potential side effects reviewed with patient 3. Recommend supportive treatment with Rest and leg elevation as necessary to control swelling. Advised the patient may be doing far too much for his age. He has been splitting wood now for 4 days which has  caused his back strain. Also has contributed to his peripheral edema. His balance issue may be part of his peripheral neuropathy which is well described in the medical records for this patient. I told him he needs to rest more often. He can apply ice to his low back. He takes Aleve for pain on occasion he should use that along with a muscle relaxer that he already has for his low back injury. I encouraged him highly to follow-up with his primary care physician for closer follow-up. 4. F/u prn if symptoms worsen or don't improve    [name], PA-C 05/05/16 2004

## 2016-05-06 DIAGNOSIS — I4891 Unspecified atrial fibrillation: Secondary | ICD-10-CM | POA: Insufficient documentation

## 2016-05-06 DIAGNOSIS — I5043 Acute on chronic combined systolic (congestive) and diastolic (congestive) heart failure: Secondary | ICD-10-CM | POA: Insufficient documentation

## 2016-05-07 DIAGNOSIS — M79606 Pain in leg, unspecified: Secondary | ICD-10-CM | POA: Insufficient documentation

## 2016-05-07 DIAGNOSIS — G8929 Other chronic pain: Secondary | ICD-10-CM | POA: Insufficient documentation

## 2016-05-24 ENCOUNTER — Inpatient Hospital Stay: Payer: Medicare PPO

## 2016-05-24 ENCOUNTER — Inpatient Hospital Stay: Payer: Medicare PPO | Attending: Hematology and Oncology

## 2016-05-24 VITALS — BP 146/89 | HR 87 | Temp 97.8°F | Resp 18

## 2016-05-24 DIAGNOSIS — E538 Deficiency of other specified B group vitamins: Secondary | ICD-10-CM | POA: Diagnosis present

## 2016-05-24 DIAGNOSIS — Z79899 Other long term (current) drug therapy: Secondary | ICD-10-CM | POA: Diagnosis not present

## 2016-05-24 DIAGNOSIS — D696 Thrombocytopenia, unspecified: Secondary | ICD-10-CM

## 2016-05-24 DIAGNOSIS — D509 Iron deficiency anemia, unspecified: Secondary | ICD-10-CM

## 2016-05-24 DIAGNOSIS — R9389 Abnormal findings on diagnostic imaging of other specified body structures: Secondary | ICD-10-CM

## 2016-05-24 LAB — CBC WITH DIFFERENTIAL/PLATELET
Basophils Absolute: 0 10*3/uL (ref 0–0.1)
Basophils Relative: 1 %
Eosinophils Absolute: 0.2 10*3/uL (ref 0–0.7)
Eosinophils Relative: 3 %
HCT: 39.1 % — ABNORMAL LOW (ref 40.0–52.0)
Hemoglobin: 13.1 g/dL (ref 13.0–18.0)
Lymphocytes Relative: 34 %
Lymphs Abs: 2 10*3/uL (ref 1.0–3.6)
MCH: 30 pg (ref 26.0–34.0)
MCHC: 33.5 g/dL (ref 32.0–36.0)
MCV: 89.6 fL (ref 80.0–100.0)
Monocytes Absolute: 0.5 10*3/uL (ref 0.2–1.0)
Monocytes Relative: 8 %
Neutro Abs: 3.2 10*3/uL (ref 1.4–6.5)
Neutrophils Relative %: 54 %
Platelets: 122 10*3/uL — ABNORMAL LOW (ref 150–440)
RBC: 4.37 MIL/uL — ABNORMAL LOW (ref 4.40–5.90)
RDW: 14.1 % (ref 11.5–14.5)
WBC: 5.8 10*3/uL (ref 3.8–10.6)

## 2016-05-24 LAB — FERRITIN: Ferritin: 79 ng/mL (ref 24–336)

## 2016-05-24 MED ORDER — CYANOCOBALAMIN 1000 MCG/ML IJ SOLN
1000.0000 ug | Freq: Once | INTRAMUSCULAR | Status: AC
  Administered 2016-05-24: 1000 ug via INTRAMUSCULAR
  Filled 2016-05-24: qty 1

## 2016-05-24 NOTE — Patient Instructions (Signed)
Cyanocobalamin, Vitamin B12 injection What is this medicine? CYANOCOBALAMIN (sye an oh koe BAL a min) is a man made form of vitamin B12. Vitamin B12 is used in the growth of healthy blood cells, nerve cells, and proteins in the body. It also helps with the metabolism of fats and carbohydrates. This medicine is used to treat people who can not absorb vitamin B12. This medicine may be used for other purposes; ask your health care provider or pharmacist if you have questions. COMMON BRAND NAME(S): B-12 Compliance Kit, B-12 Injection Kit, Cyomin, LA-12, Nutri-Twelve, Physicians EZ Use B-12, Primabalt What should I tell my health care provider before I take this medicine? They need to know if you have any of these conditions: -kidney disease -Leber's disease -megaloblastic anemia -an unusual or allergic reaction to cyanocobalamin, cobalt, other medicines, foods, dyes, or preservatives -pregnant or trying to get pregnant -breast-feeding How should I use this medicine? This medicine is injected into a muscle or deeply under the skin. It is usually given by a health care professional in a clinic or doctor's office. However, your doctor may teach you how to inject yourself. Follow all instructions. Talk to your pediatrician regarding the use of this medicine in children. Special care may be needed. Overdosage: If you think you have taken too much of this medicine contact a poison control center or emergency room at once. NOTE: This medicine is only for you. Do not share this medicine with others. What if I miss a dose? If you are given your dose at a clinic or doctor's office, call to reschedule your appointment. If you give your own injections and you miss a dose, take it as soon as you can. If it is almost time for your next dose, take only that dose. Do not take double or extra doses. What may interact with this medicine? -colchicine -heavy alcohol intake This list may not describe all possible  interactions. Give your health care provider a list of all the medicines, herbs, non-prescription drugs, or dietary supplements you use. Also tell them if you smoke, drink alcohol, or use illegal drugs. Some items may interact with your medicine. What should I watch for while using this medicine? Visit your doctor or health care professional regularly. You may need blood work done while you are taking this medicine. You may need to follow a special diet. Talk to your doctor. Limit your alcohol intake and avoid smoking to get the best benefit. What side effects may I notice from receiving this medicine? Side effects that you should report to your doctor or health care professional as soon as possible: -allergic reactions like skin rash, itching or hives, swelling of the face, lips, or tongue -blue tint to skin -chest tightness, pain -difficulty breathing, wheezing -dizziness -red, swollen painful area on the leg Side effects that usually do not require medical attention (report to your doctor or health care professional if they continue or are bothersome): -diarrhea -headache This list may not describe all possible side effects. Call your doctor for medical advice about side effects. You may report side effects to FDA at 1-800-FDA-1088. Where should I keep my medicine? Keep out of the reach of children. Store at room temperature between 15 and 30 degrees C (59 and 85 degrees F). Protect from light. Throw away any unused medicine after the expiration date. NOTE: This sheet is a summary. It may not cover all possible information. If you have questions about this medicine, talk to your doctor, pharmacist, or   health care provider.  2018 Elsevier/Gold Standard (2007-05-20 22:10:20)  

## 2016-06-21 ENCOUNTER — Inpatient Hospital Stay: Payer: Medicare PPO | Attending: Hematology and Oncology

## 2016-06-21 VITALS — BP 156/88 | HR 76 | Temp 97.6°F | Resp 18

## 2016-06-21 DIAGNOSIS — E538 Deficiency of other specified B group vitamins: Secondary | ICD-10-CM | POA: Diagnosis present

## 2016-06-21 DIAGNOSIS — Z79899 Other long term (current) drug therapy: Secondary | ICD-10-CM | POA: Diagnosis not present

## 2016-06-21 MED ORDER — CYANOCOBALAMIN 1000 MCG/ML IJ SOLN
1000.0000 ug | Freq: Once | INTRAMUSCULAR | Status: AC
  Administered 2016-06-21: 1000 ug via INTRAMUSCULAR
  Filled 2016-06-21: qty 1

## 2016-07-04 ENCOUNTER — Inpatient Hospital Stay: Payer: Medicare PPO

## 2016-07-04 ENCOUNTER — Ambulatory Visit
Admission: RE | Admit: 2016-07-04 | Discharge: 2016-07-04 | Disposition: A | Discharge status: Home / Self Care | Payer: Medicare PPO | Source: Ambulatory Visit | Attending: Hematology and Oncology | Admitting: Hematology and Oncology

## 2016-07-04 DIAGNOSIS — D509 Iron deficiency anemia, unspecified: Secondary | ICD-10-CM

## 2016-07-04 DIAGNOSIS — I700 Atherosclerosis of aorta: Secondary | ICD-10-CM | POA: Diagnosis not present

## 2016-07-04 DIAGNOSIS — D696 Thrombocytopenia, unspecified: Secondary | ICD-10-CM

## 2016-07-04 DIAGNOSIS — I2510 Atherosclerotic heart disease of native coronary artery without angina pectoris: Secondary | ICD-10-CM | POA: Diagnosis not present

## 2016-07-04 DIAGNOSIS — E538 Deficiency of other specified B group vitamins: Secondary | ICD-10-CM | POA: Insufficient documentation

## 2016-07-04 DIAGNOSIS — R938 Abnormal findings on diagnostic imaging of other specified body structures: Secondary | ICD-10-CM | POA: Diagnosis present

## 2016-07-04 DIAGNOSIS — J849 Interstitial pulmonary disease, unspecified: Secondary | ICD-10-CM | POA: Diagnosis not present

## 2016-07-04 DIAGNOSIS — R918 Other nonspecific abnormal finding of lung field: Secondary | ICD-10-CM | POA: Insufficient documentation

## 2016-07-04 DIAGNOSIS — R9389 Abnormal findings on diagnostic imaging of other specified body structures: Secondary | ICD-10-CM

## 2016-07-04 LAB — CBC WITH DIFFERENTIAL/PLATELET
Basophils Absolute: 0.1 10*3/uL (ref 0–0.1)
Basophils Relative: 1 %
Eosinophils Absolute: 0.6 10*3/uL (ref 0–0.7)
Eosinophils Relative: 8 %
HCT: 40.7 % (ref 40.0–52.0)
Hemoglobin: 13.7 g/dL (ref 13.0–18.0)
Lymphocytes Relative: 27 %
Lymphs Abs: 1.9 10*3/uL (ref 1.0–3.6)
MCH: 30.5 pg (ref 26.0–34.0)
MCHC: 33.8 g/dL (ref 32.0–36.0)
MCV: 90.3 fL (ref 80.0–100.0)
Monocytes Absolute: 0.6 10*3/uL (ref 0.2–1.0)
Monocytes Relative: 8 %
Neutro Abs: 4 10*3/uL (ref 1.4–6.5)
Neutrophils Relative %: 56 %
Platelets: 153 10*3/uL (ref 150–440)
RBC: 4.5 MIL/uL (ref 4.40–5.90)
RDW: 13.9 % (ref 11.5–14.5)
WBC: 7.1 10*3/uL (ref 3.8–10.6)

## 2016-07-04 LAB — FOLATE: Folate: 19.8 ng/mL (ref >5.9)

## 2016-07-04 LAB — FERRITIN: Ferritin: 83 ng/mL (ref 24–336)

## 2016-07-04 NOTE — Progress Notes (Signed)
8:45 AM   Annamaria Helling 19-Jan-1941 [id number]  Referring provider: Ezequiel Kayser, MD [facility] [place], [place] [address]  Chief Complaint  Patient presents with  . Benign Prostatic Hypertrophy    1 year follow up  . Nocturia    HPI: Patient is a 76 year old WM who presents today for a 12 month follow up for BPH with LUTS, nocturia and a history of urinary retention.     BPH with LUTS His IPSS score today is 9, which is moderate LUTS.  He is pleased with his quality of life.   His last IPSS score  was 7/2.  He denies any dysuria, hematuria or suprapubic pain.  He is no longer taken Cialis or dutasteride for the last two months.  He has not noted any difference in his LU TS.   His has had TURP with the removal of a bladder diverticulum in the remote past.  In the last couple of years, he had undergone 2 cystoscopic examinations. Both of the cystoscopic examinations did not identify any significant bladder outlet obstruction.  He also denies any recent fevers, chills, nausea or vomiting.  He does not have a family history of PCa.      IPSS    Row Name 07/07/16 0800         International Prostate Symptom Score   How often have you had the sensation of not emptying your bladder? Less than 1 in 5     How often have you had to urinate less than every two hours? Less than 1 in 5 times     How often have you found you stopped and started again several times when you urinated? Less than 1 in 5 times     How often have you found it difficult to postpone urination? Less than half the time     How often have you had a weak urinary stream? Less than 1 in 5 times     How often have you had to strain to start urination? Not at All     How many times did you typically get up at night to urinate? 3 Times     Total IPSS Score 9       Quality of Life due to urinary symptoms   If you were to spend the rest of your life with your urinary condition just the way it  is now how would you feel about that? Pleased        Score:  1-7 Mild 8-19 Moderate 20-35 Severe  Nocturia Patient's nocturia has diminished from 3-4 times nightly to 1-2 times nightly. He is quite pleased with these results.  History of urinary retention Patient discontinued his Cialis and dutasteride at this time.  He states he is voiding well.    Erectile dysfunction His SHIM score is 5, which is severes.   He has been having difficulty with erections for several years.   His major complaint is no partner.  His libido is diminished.   His risk factors for ED are age, BPH, HTN and HLD.  He denies any painful erections or curvatures with his erections.   He is still having/no longer having spontaneous erections.  He has tried Cialis in the past.         [place] Name 07/07/16 [phone]         SHIM: Over the last 6 months:   How do you  rate your confidence that you could get and keep an erection? Very Low     When you had erections with sexual stimulation, how often were your erections hard enough for penetration (entering your partner)? Almost Never or Never     During sexual intercourse, how often were you able to maintain your erection after you had penetrated (entered) your partner? Almost Never or Never     During sexual intercourse, how difficult was it to maintain your erection to completion of intercourse? Extremely Difficult     When you attempted sexual intercourse, how often was it satisfactory for you? Almost Never or Never       SHIM Total Score   SHIM 5        Score: 1-7 Severe ED 8-11 Moderate ED 12-16 Mild-Moderate ED 17-21 Mild ED 22-25 No ED      PMH: Past Medical History:  Diagnosis Date  . Anemia   . BPH (benign prostatic hyperplasia)   . CHF (congestive heart failure) ([place])   . Dysphagia   . GERD (gastroesophageal reflux disease)   . HLD (hyperlipidemia)   . HTN (hypertension)   . Irregular heart beat   . Nocturia   . Urinary retention      Surgical History: Past Surgical History:  Procedure Laterality Date  . bladder patching  1964  . HIP SURGERY Right 2014  . TRANSURETHRAL RESECTION OF PROSTATE      Home Medications:  Allergies as of 07/07/2016      Reactions   Hydrocodone-acetaminophen Nausea And Vomiting      Medication List       Accurate as of 07/07/16  8:45 AM. Always use your most recent med list.          acetaminophen 500 MG tablet Commonly known as:  TYLENOL Take by mouth.   ALEVE 220 MG Caps Generic drug:  Naproxen Sodium Take 1 capsule by mouth as needed.   aspirin 81 MG tablet Take 81 mg by mouth daily.   B-12 1000 MCG/ML Kit Inject 1 mL as directed every 30 (thirty) days.   bimatoprost 0.01 % Soln Commonly known as:  LUMIGAN Place 1 drop into both eyes 2 (two) times daily.   cyclobenzaprine 10 MG tablet Commonly known as:  FLEXERIL Take 1 tablet (10 mg total) by mouth 3 (three) times daily as needed for muscle spasms.   dorzolamide-timolol 22.3-6.8 MG/ML ophthalmic solution Commonly known as:  COSOPT Place 1 drop into both eyes 2 (two) times daily.   dutasteride 0.5 MG capsule Commonly known as:  AVODART Take 1 capsule (0.5 mg total) by mouth daily.   esomeprazole 20 MG capsule Commonly known as:  NEXIUM Take 20 mg by mouth daily at 12 noon.   ferrous sulfate 325 (65 FE) MG tablet Take 325 mg by mouth daily.   furosemide 20 MG tablet Commonly known as:  LASIX Take 20 mg by mouth daily as needed for fluid. Try to limit to no more than 3-4 times per week.   gabapentin 100 MG capsule Commonly known as:  NEURONTIN   mupirocin ointment 2 % Commonly known as:  BACTROBAN Apply 1 application topically 3 (three) times daily.   oxyCODONE-acetaminophen 5-325 MG tablet Commonly known as:  PERCOCET Take 2 tablets by mouth every 6 (six) hours as needed for moderate pain or severe pain.   rOPINIRole 0.25 MG tablet Commonly known as:  REQUIP Take 0.25 mg by mouth.     tadalafil 5 MG tablet Commonly  known as:  CIALIS Take 1 tablet (5 mg total) by mouth daily.       Allergies:  Allergies  Allergen Reactions  . Hydrocodone-Acetaminophen Nausea And Vomiting    Family History: Family History  Problem Relation Age of Onset  . Ulcers Mother        stomach  . GER disease Sister        GERD  . Heart attack Father   . Diabetes Brother   . Depression Brother   . Kidney disease Neg Hx   . Prostate cancer Neg Hx   . Kidney cancer Neg Hx   . Bladder Cancer Neg Hx     Social History:  reports that he has never smoked. He has never used smokeless tobacco. He reports that he does not drink alcohol or use drugs.  ROS: UROLOGY Frequent Urination?: No Hard to postpone urination?: No Burning/pain with urination?: No Get up at night to urinate?: Yes Leakage of urine?: No Urine stream starts and stops?: No Trouble starting stream?: No Do you have to strain to urinate?: No Blood in urine?: No Urinary tract infection?: No Sexually transmitted disease?: No Injury to kidneys or bladder?: No Painful intercourse?: No Weak stream?: No Erection problems?: No Penile pain?: No  Gastrointestinal Nausea?: No Vomiting?: No Indigestion/heartburn?: No Diarrhea?: No Constipation?: No  Constitutional Fever: No Night sweats?: No Weight loss?: No Fatigue?: No  Skin Skin rash/lesions?: No Itching?: No  Eyes Blurred vision?: No Double vision?: No  Ears/Nose/Throat Sore throat?: No Sinus problems?: No  Hematologic/Lymphatic Swollen glands?: No Easy bruising?: Yes  Cardiovascular Leg swelling?: Yes Chest pain?: No  Respiratory Cough?: No Shortness of breath?: No  Endocrine Excessive thirst?: No  Musculoskeletal Back pain?: Yes Joint pain?: No  Neurological Headaches?: No Dizziness?: No  Psychologic Depression?: No Anxiety?: Yes  Physical Exam: BP 115/83   Pulse 84   Ht _0  (1.88 m)   Wt 212 lb (96.2 kg)   BMI 27.22  kg/m   Constitutional: Well nourished. Alert and oriented, No acute distress. HEENT: [place] AT, moist mucus membranes. Trachea midline, no masses. Cardiovascular: No clubbing, cyanosis, or edema. Respiratory: Normal respiratory effort, no increased work of breathing. GI: Abdomen is soft, non tender, non distended, no abdominal masses. Liver and spleen not palpable.  No hernias appreciated.  Stool sample for occult testing is not indicated.   GU: No CVA tenderness.  No bladder fullness or masses.  Patient with uncircumcised phallus. Foreskin is difficult to retract.  Urethral meatus is patent.  No penile discharge. No penile lesions or rashes. Scrotum without lesions, cysts, rashes and/or edema.  Testicles are located scrotally bilaterally. No masses are appreciated in the testicles. Left and right epididymis are normal. Rectal: Patient with  normal sphincter tone. Anus and perineum without scarring or rashes. No rectal masses are appreciated. Prostate is approximately 45 grams, no nodules are appreciated. Seminal vesicles are normal. Skin: No rashes, bruises or suspicious lesions. Lymph: No cervical or inguinal adenopathy. Neurologic: Grossly intact, no focal deficits, moving all 4 extremities. Psychiatric: Normal mood and affect.  Laboratory Data: Lab Results  Component Value Date   WBC 7.1 07/04/2016   HGB 13.7 07/04/2016   HCT 40.7 07/04/2016   MCV 90.3 07/04/2016   PLT 153 07/04/2016    Lab Results  Component Value Date   CREATININE 1.11 04/12/2016    Lab Results  Component Value Date   PSA 0.2 10/04/2011   PSA history:  0.4 ng/mL on 06/21/2012  0.1 ng/mL on 07/02/2013  0.1 ng/mL on 04/10/2014   Assessment & Plan:    1. BPH with LUTS  - IPSS score is 9/1, it is orsening  - Continue conservative management, avoiding bladder irritants and timed voiding's  - most bothersome symptoms is/are nocturia  - discontinue Cialis 5 mg daily and dutasteride 0.5 mg daily  - RTC in 12  months for I PSS and exam  2. Nocturia:  Patient has found a great reduction in his nocturia at this time. We will continue to monitor.   3. History of urinary retention:   Patient has a history of urinary retention which has resolved with medical therapy for BPH. We will continue to monitor.   4. ED  - does not desire treatment at this time    Return in about 1 year (around 07/07/2017) for IPSS and exam.  Zara Council, [facility]  [facility] [address], [place], [place] [address] [phone]

## 2016-07-05 ENCOUNTER — Other Ambulatory Visit: Payer: Self-pay | Admitting: *Deleted

## 2016-07-05 DIAGNOSIS — D509 Iron deficiency anemia, unspecified: Secondary | ICD-10-CM

## 2016-07-05 DIAGNOSIS — D696 Thrombocytopenia, unspecified: Secondary | ICD-10-CM

## 2016-07-07 ENCOUNTER — Encounter: Payer: Self-pay | Admitting: Urology

## 2016-07-07 ENCOUNTER — Ambulatory Visit (INDEPENDENT_AMBULATORY_CARE_PROVIDER_SITE_OTHER): Payer: Medicare PPO | Admitting: Urology

## 2016-07-07 VITALS — BP 115/83 | HR 84 | Ht 74.0 in | Wt 212.0 lb

## 2016-07-07 DIAGNOSIS — N529 Male erectile dysfunction, unspecified: Secondary | ICD-10-CM

## 2016-07-07 DIAGNOSIS — R351 Nocturia: Secondary | ICD-10-CM

## 2016-07-07 DIAGNOSIS — Z87898 Personal history of other specified conditions: Secondary | ICD-10-CM

## 2016-07-07 DIAGNOSIS — N401 Enlarged prostate with lower urinary tract symptoms: Secondary | ICD-10-CM | POA: Diagnosis not present

## 2016-07-07 DIAGNOSIS — N138 Other obstructive and reflux uropathy: Secondary | ICD-10-CM

## 2016-07-26 ENCOUNTER — Inpatient Hospital Stay: Payer: Medicare PPO

## 2016-07-27 ENCOUNTER — Inpatient Hospital Stay: Payer: Medicare PPO

## 2016-07-28 ENCOUNTER — Inpatient Hospital Stay: Payer: Medicare PPO | Attending: Hematology and Oncology

## 2016-07-28 DIAGNOSIS — Z79899 Other long term (current) drug therapy: Secondary | ICD-10-CM | POA: Diagnosis not present

## 2016-07-28 DIAGNOSIS — E538 Deficiency of other specified B group vitamins: Secondary | ICD-10-CM

## 2016-07-28 MED ORDER — CYANOCOBALAMIN 1000 MCG/ML IJ SOLN
1000.0000 ug | Freq: Once | INTRAMUSCULAR | Status: AC
  Administered 2016-07-28: 1000 ug via INTRAMUSCULAR
  Filled 2016-07-28: qty 1

## 2016-07-28 NOTE — Patient Instructions (Signed)
Cyanocobalamin, Vitamin B12 injection What is this medicine? CYANOCOBALAMIN (sye an oh koe BAL a min) is a man made form of vitamin B12. Vitamin B12 is used in the growth of healthy blood cells, nerve cells, and proteins in the body. It also helps with the metabolism of fats and carbohydrates. This medicine is used to treat people who can not absorb vitamin B12. This medicine may be used for other purposes; ask your health care provider or pharmacist if you have questions. COMMON BRAND NAME(S): B-12 Compliance Kit, B-12 Injection Kit, Cyomin, LA-12, Nutri-Twelve, Physicians EZ Use B-12, Primabalt What should I tell my health care provider before I take this medicine? They need to know if you have any of these conditions: -kidney disease -Leber's disease -megaloblastic anemia -an unusual or allergic reaction to cyanocobalamin, cobalt, other medicines, foods, dyes, or preservatives -pregnant or trying to get pregnant -breast-feeding How should I use this medicine? This medicine is injected into a muscle or deeply under the skin. It is usually given by a health care professional in a clinic or doctor's office. However, your doctor may teach you how to inject yourself. Follow all instructions. Talk to your pediatrician regarding the use of this medicine in children. Special care may be needed. Overdosage: If you think you have taken too much of this medicine contact a poison control center or emergency room at once. NOTE: This medicine is only for you. Do not share this medicine with others. What if I miss a dose? If you are given your dose at a clinic or doctor's office, call to reschedule your appointment. If you give your own injections and you miss a dose, take it as soon as you can. If it is almost time for your next dose, take only that dose. Do not take double or extra doses. What may interact with this medicine? -colchicine -heavy alcohol intake This list may not describe all possible  interactions. Give your health care provider a list of all the medicines, herbs, non-prescription drugs, or dietary supplements you use. Also tell them if you smoke, drink alcohol, or use illegal drugs. Some items may interact with your medicine. What should I watch for while using this medicine? Visit your doctor or health care professional regularly. You may need blood work done while you are taking this medicine. You may need to follow a special diet. Talk to your doctor. Limit your alcohol intake and avoid smoking to get the best benefit. What side effects may I notice from receiving this medicine? Side effects that you should report to your doctor or health care professional as soon as possible: -allergic reactions like skin rash, itching or hives, swelling of the face, lips, or tongue -blue tint to skin -chest tightness, pain -difficulty breathing, wheezing -dizziness -red, swollen painful area on the leg Side effects that usually do not require medical attention (report to your doctor or health care professional if they continue or are bothersome): -diarrhea -headache This list may not describe all possible side effects. Call your doctor for medical advice about side effects. You may report side effects to FDA at 1-800-FDA-1088. Where should I keep my medicine? Keep out of the reach of children. Store at room temperature between 15 and 30 degrees C (59 and 85 degrees F). Protect from light. Throw away any unused medicine after the expiration date. NOTE: This sheet is a summary. It may not cover all possible information. If you have questions about this medicine, talk to your doctor, pharmacist, or   health care provider.  2018 Elsevier/Gold Standard (2007-05-20 22:10:20)   

## 2016-08-29 ENCOUNTER — Other Ambulatory Visit: Payer: Self-pay | Admitting: *Deleted

## 2016-08-29 DIAGNOSIS — E538 Deficiency of other specified B group vitamins: Secondary | ICD-10-CM

## 2016-08-30 ENCOUNTER — Encounter: Payer: Self-pay | Admitting: Hematology and Oncology

## 2016-08-30 ENCOUNTER — Inpatient Hospital Stay: Payer: Medicare PPO

## 2016-08-30 ENCOUNTER — Inpatient Hospital Stay: Payer: Medicare PPO | Attending: Hematology and Oncology

## 2016-08-30 ENCOUNTER — Inpatient Hospital Stay (HOSPITAL_BASED_OUTPATIENT_CLINIC_OR_DEPARTMENT_OTHER): Payer: Medicare PPO | Admitting: Hematology and Oncology

## 2016-08-30 VITALS — BP 126/74 | HR 94 | Temp 97.6°F | Resp 20 | Wt 211.6 lb

## 2016-08-30 DIAGNOSIS — I700 Atherosclerosis of aorta: Secondary | ICD-10-CM

## 2016-08-30 DIAGNOSIS — N400 Enlarged prostate without lower urinary tract symptoms: Secondary | ICD-10-CM | POA: Insufficient documentation

## 2016-08-30 DIAGNOSIS — I499 Cardiac arrhythmia, unspecified: Secondary | ICD-10-CM | POA: Diagnosis not present

## 2016-08-30 DIAGNOSIS — I10 Essential (primary) hypertension: Secondary | ICD-10-CM | POA: Diagnosis not present

## 2016-08-30 DIAGNOSIS — R748 Abnormal levels of other serum enzymes: Secondary | ICD-10-CM | POA: Insufficient documentation

## 2016-08-30 DIAGNOSIS — Z79899 Other long term (current) drug therapy: Secondary | ICD-10-CM | POA: Insufficient documentation

## 2016-08-30 DIAGNOSIS — E785 Hyperlipidemia, unspecified: Secondary | ICD-10-CM

## 2016-08-30 DIAGNOSIS — Z7982 Long term (current) use of aspirin: Secondary | ICD-10-CM

## 2016-08-30 DIAGNOSIS — I2510 Atherosclerotic heart disease of native coronary artery without angina pectoris: Secondary | ICD-10-CM | POA: Insufficient documentation

## 2016-08-30 DIAGNOSIS — K219 Gastro-esophageal reflux disease without esophagitis: Secondary | ICD-10-CM

## 2016-08-30 DIAGNOSIS — D509 Iron deficiency anemia, unspecified: Secondary | ICD-10-CM

## 2016-08-30 DIAGNOSIS — R351 Nocturia: Secondary | ICD-10-CM | POA: Diagnosis not present

## 2016-08-30 DIAGNOSIS — D696 Thrombocytopenia, unspecified: Secondary | ICD-10-CM | POA: Insufficient documentation

## 2016-08-30 DIAGNOSIS — E538 Deficiency of other specified B group vitamins: Secondary | ICD-10-CM | POA: Diagnosis not present

## 2016-08-30 DIAGNOSIS — R339 Retention of urine, unspecified: Secondary | ICD-10-CM

## 2016-08-30 DIAGNOSIS — R1310 Dysphagia, unspecified: Secondary | ICD-10-CM

## 2016-08-30 DIAGNOSIS — R918 Other nonspecific abnormal finding of lung field: Secondary | ICD-10-CM

## 2016-08-30 DIAGNOSIS — I509 Heart failure, unspecified: Secondary | ICD-10-CM

## 2016-08-30 DIAGNOSIS — Z8701 Personal history of pneumonia (recurrent): Secondary | ICD-10-CM

## 2016-08-30 LAB — CBC WITH DIFFERENTIAL/PLATELET
Basophils Absolute: 0.1 10*3/uL (ref 0–0.1)
Basophils Relative: 1 %
Eosinophils Absolute: 0.1 10*3/uL (ref 0–0.7)
Eosinophils Relative: 3 %
HCT: 39.8 % — ABNORMAL LOW (ref 40.0–52.0)
Hemoglobin: 13.5 g/dL (ref 13.0–18.0)
Lymphocytes Relative: 29 %
Lymphs Abs: 1.7 10*3/uL (ref 1.0–3.6)
MCH: 31.2 pg (ref 26.0–34.0)
MCHC: 34 g/dL (ref 32.0–36.0)
MCV: 91.9 fL (ref 80.0–100.0)
Monocytes Absolute: 0.4 10*3/uL (ref 0.2–1.0)
Monocytes Relative: 7 %
Neutro Abs: 3.6 10*3/uL (ref 1.4–6.5)
Neutrophils Relative %: 62 %
Platelets: 142 10*3/uL — ABNORMAL LOW (ref 150–440)
RBC: 4.33 MIL/uL — ABNORMAL LOW (ref 4.40–5.90)
RDW: 13.6 % (ref 11.5–14.5)
WBC: 5.9 10*3/uL (ref 3.8–10.6)

## 2016-08-30 LAB — FOLATE: Folate: 18.1 ng/mL (ref >5.9)

## 2016-08-30 LAB — FERRITIN: Ferritin: 87 ng/mL (ref 24–336)

## 2016-08-30 MED ORDER — CYANOCOBALAMIN 1000 MCG/ML IJ SOLN
1000.0000 ug | Freq: Once | INTRAMUSCULAR | Status: AC
  Administered 2016-08-30: 1000 ug via INTRAMUSCULAR

## 2016-08-30 NOTE — Progress Notes (Signed)
[facility] day:  08/30/16   Chief Complaint: Jeremy Herrera is a 76 y.o. male with B12 deficiency, iron deficiency, and mild thrombocytopenia (ITP) who is seen for 6 month assessment.  HPI: The patient was last seen in the medical oncology clinic on 02/23/2016.  At that time, he had ongoing swallowing difficulties.  Exam was stable.  Hematocrit was 37.9. Platelet count was 130,000.  Ferritin was 73.  He continued oral iron and monthly B12 (last 07/28/2016).  CBC on 07/04/2016 revealed a hematocrit of 40.7, hemoglobin 13.7, MCV 90.3, platelets 153,000, white count 7100 with an ANC of 4000.  Ferritin was 83.  Folate was 19.8.   High resolution chest CT on 07/04/2016 was compatible with interstitial lung disease. The overall pattern of disease was nonspecific, however, there was clear evidence of progression compared to the prior study 07/05/2015. Findings are worrisome for potential usual interstitial pneumonia (UIP), however, this may simply reflect chronic hypersensitivity pneumonitis or nonspecific interstitial pneumonia (NSIP). Accordingly, repeat high-resolution chest CT is recommended in 12 months to assess for temporal changes in the appearance of the lung parenchyma.  Most pulmonary nodules are stable except a 7 x 5 mm nodule in the left lower lobe which has slightly enlarged. Attention at time of follow-up examination in 1 year was recommended.  There was aortic atherosclerosis, in addition to 3 vessel coronary artery disease. There were calcifications of the aortic valve.  He was seen by Dr. Wallene Huh on 07/26/2016.  Laboratory work-up (ACE, ANA, RF,hypersensitivity pneumonitis panel, sed rate, and BNP) were sent.  He is scheduled for follow-up in 6 months.  Symptomatically, he feels pretty good. He notes that if he runs out of his Nexium for 3-4 days, he has problems. He was told that his lung function is good. He needs no medications for  now. He has follow-up in 6 months.   Past Medical History:  Diagnosis Date  . Anemia   . BPH (benign prostatic hyperplasia)   . CHF (congestive heart failure) ([place])   . Dysphagia   . GERD (gastroesophageal reflux disease)   . HLD (hyperlipidemia)   . HTN (hypertension)   . Irregular heart beat   . Nocturia   . Urinary retention     Past Surgical History:  Procedure Laterality Date  . bladder patching  1964  . HIP SURGERY Right 2014  . TRANSURETHRAL RESECTION OF PROSTATE      Family History  Problem Relation Age of Onset  . Ulcers Mother        stomach  . GER disease Sister        GERD  . Heart attack Father   . Diabetes Brother   . Depression Brother   . Kidney disease Neg Hx   . Prostate cancer Neg Hx   . Kidney cancer Neg Hx   . Bladder Cancer Neg Hx     Social History:  reports that he has never smoked. He has never used smokeless tobacco. He reports that he does not drink alcohol or use drugs.   He lives in [place].  He is alone today.  Allergies:  Allergies  Allergen Reactions  . Hydrocodone-Acetaminophen Nausea And Vomiting    Current Medications: Current Outpatient Prescriptions  Medication Sig Dispense Refill  . acetaminophen (TYLENOL) 500 MG tablet Take by mouth.    Marland Kitchen aspirin 81 MG tablet Take 81 mg by mouth daily.    . bimatoprost (LUMIGAN) 0.01 % SOLN  Place 1 drop into both eyes 2 (two) times daily.     . Cyanocobalamin (B-12) 1000 MCG/ML KIT Inject 1 mL as directed every 30 (thirty) days.    . cyclobenzaprine (FLEXERIL) 10 MG tablet Take 1 tablet (10 mg total) by mouth 3 (three) times daily as needed for muscle spasms. 30 tablet 0  . dorzolamide-timolol (COSOPT) 22.3-6.8 MG/ML ophthalmic solution Place 1 drop into both eyes 2 (two) times daily.     Marland Kitchen dutasteride (AVODART) 0.5 MG capsule Take 1 capsule (0.5 mg total) by mouth daily. 30 capsule 12  . esomeprazole (NEXIUM) 20 MG capsule Take 20 mg by mouth daily at 12 noon.    . ferrous sulfate 325  (65 FE) MG tablet Take 325 mg by mouth daily.    . furosemide (LASIX) 20 MG tablet Take 20 mg by mouth daily as needed for fluid. Try to limit to no more than 3-4 times per week.    . gabapentin (NEURONTIN) 100 MG capsule     . mupirocin ointment (BACTROBAN) 2 % Apply 1 application topically 3 (three) times daily. 22 g 0  . Naproxen Sodium (ALEVE) 220 MG CAPS Take 1 capsule by mouth as needed.    Marland Kitchen oxyCODONE-acetaminophen (PERCOCET) 5-325 MG tablet Take 2 tablets by mouth every 6 (six) hours as needed for moderate pain or severe pain. 20 tablet 0  . rOPINIRole (REQUIP) 0.25 MG tablet Take 0.25 mg by mouth.    . tadalafil (CIALIS) 5 MG tablet Take 1 tablet (5 mg total) by mouth daily. 30 tablet 12   No current facility-administered medications for this visit.     Review of Systems:  GENERAL:  Feels "good".  Active (chopping wood).  No fevers or sweats.  Weight up 6 pounds. PERFORMANCE STATUS (ECOG): 1 HEENT:  No visual changes, runny nose, sore throat, mouth sores or tenderness. Lungs:  Shortness of breath with exertion.  No cough.  No hemoptysis. Cardiac:  No chest pain, palpitations, orthopnea, or PND. GI:   Swallowing difficulties.  No nausea, vomiting, diarrhea, constipation, melena or hematochezia. GU:  No urgency, frequency, dysuria, or hematuria. Musculoskeletal:  No back pain.  No joint pain.  No muscle tenderness. Extremities:  No pain or swelling. Skin:  No rashes or skin changes. Neuro:  No headache, numbness or weakness, balance or coordination issues. Endocrine:  No diabetes, thyroid issues, hot flashes or night sweats. Psych:  No mood changes, depression or anxiety. Pain:  No focal pain. Review of systems:  All other systems reviewed and found to be negative.  Physical Exam: Blood pressure 126/74, pulse 94, temperature 97.6 F (36.4 C), temperature source Tympanic, resp. rate 20, weight 211 lb 10.3 oz (96 kg). GENERAL:  Well developed, well nourished, tall gentleman  sitting comfortably in the exam room in no acute distress.  He has a cane at his side. MENTAL STATUS:  Alert and oriented to person, place and time. HEAD:  Pearline Cables hair.  Male pattern baldness.  Normocephalic, atraumatic, face symmetric, no Cushingoid features. EYES:  Glasses.  Blue eyes. Pupils equal round and reactive to light and accomodation.  No conjunctivitis or scleral icterus. ENT:  Clears throat regularly.  Oropharynx clear without lesion.  Tongue normal. Mucous membranes moist.  RESPIRATORY:  Clear to auscultation without rales, wheezes or rhonchi. CARDIOVASCULAR:  Regular rate and rhythm without murmur, rub or gallop. ABDOMEN:  Soft, non-tender, with active bowel sounds, and no hepatosplenomegaly.  No masses. SKIN: No rashes, ulcers or lesions. EXTREMITIES: No  edema, no skin discoloration or tenderness.  No palpable cords. LYMPH NODES: No palpable cervical, supraclavicular, axillary or inguinal adenopathy  NEUROLOGICAL: Unremarkable. PSYCH:  Appropriate.    Appointment on 08/30/2016  Component Date Value Ref Range Status  . WBC 08/30/2016 5.9  3.8 - 10.6 K/uL Final  . RBC 08/30/2016 4.33* 4.40 - 5.90 MIL/uL Final  . Hemoglobin 08/30/2016 13.5  13.0 - 18.0 g/dL Final  . HCT 08/30/2016 39.8* 40.0 - 52.0 % Final  . MCV 08/30/2016 91.9  80.0 - 100.0 fL Final  . MCH 08/30/2016 31.2  26.0 - 34.0 pg Final  . MCHC 08/30/2016 34.0  32.0 - 36.0 g/dL Final  . RDW 08/30/2016 13.6  11.5 - 14.5 % Final  . Platelets 08/30/2016 142* 150 - 440 K/uL Final  . Neutrophils Relative % 08/30/2016 62  % Final  . Neutro Abs 08/30/2016 3.6  1.4 - 6.5 K/uL Final  . Lymphocytes Relative 08/30/2016 29  % Final  . Lymphs Abs 08/30/2016 1.7  1.0 - 3.6 K/uL Final  . Monocytes Relative 08/30/2016 7  % Final  . Monocytes Absolute 08/30/2016 0.4  0.2 - 1.0 K/uL Final  . Eosinophils Relative 08/30/2016 3  % Final  . Eosinophils Absolute 08/30/2016 0.1  0 - 0.7 K/uL Final  . Basophils Relative 08/30/2016 1  %  Final  . Basophils Absolute 08/30/2016 0.1  0 - 0.1 K/uL Final    Assessment:  Jeremy Herrera is a 76 y.o. 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  He denies any new medications or herbal products. He has no known autoimmune disease. He has never required transfusion.   Work-up on 09/16/2014 revealed a hematocrit 36.4, hemoglobin 12.3, MCV 91.5, platelets 124,000 white count 4800 with an ANC of 3000. Differential was unremarkable. B12 was 262 (low normal) with an elevated MMA consistent with B12 deficiency.  He began weekly 10/14/2014 (last 07/28/2016).  Folate was 18.1 on 08/30/2016.  Normal labs included: folate, CMP, ANA, iron studies, hepatitis C antibody, hepatitis B surface antigen and core antibody, HIV testing, SPEP, free light chains, and PTT. TSH  was normal on 08/25/2014.  UPEP revealed no monoclonal protein.  He has a history of iron deficiency anemia.  EGD and colonoscopy 10 years ago were negative. He required esophageal dilatation. He has a history of reflux. Colonoscopy 3-4 years ago was unremarkable per the patient.  His diet is good.  He eats meat twice a week.  He is on oral iron.  He was diagnosed with a community acquired pneumonia on 05/20/2015.  He is completing a Z pack.  Chest CT angiogram on 05/20/2015 revealed a nodular focus of consolidation (indeterminate) for which neoplasm could not be completely excluded. Recommendation was for chest CT without contrast 1-2 months.   Chest CT on 07/05/2015 revealed resolved medial right lower lobe consolidation with residual mild ground-glass opacity.  There was mild patchy subpleural reticulation and ground-glass attenuation in both lungs, cannot exclude interstitial lung disease such as nonspecific interstitial pneumonia (NSIP).   He has a history of an elevated alkaline phosphatase (138-151) without trend since  09/2011.  Symptomatically, he denies any respiratory symptoms.  Exam is stable.  Hematocrit is 39.8. Platelet count is 142,000.  Ferritin is 87.  Plan: 1.  Labs today:  CBC with diff, ferritin, folate. 2.  Review interval high resolution chest CT.  Findings compatible with ILD (interstitial lung disease) and possibly suggestive of usual interstitial  pneumonia (UIP), or chronic hypersensitivity pneumonitis or nonspecific interstitial pneumonia (NSIP).  Discuss 7 mm LLL nodule.  Discuss plan for follow-up high resolution chest CT in 12 months. Discuss ongoing follow-up with pulmonary medicine. 3.  Discuss swallowing issues.  Patient last seen by GI on 09/30/2015.   4.  B12 today and monthly x 5. 5.  RTC in 3 months for labs (CBC with diff, ferritin) + B12 6.  RTC in 6 months for MD assessment, labs (CBC with diff, CMP, ferritin, folate), and B12.   Lequita Asal, MD  08/30/2016, 11:22 AM

## 2016-08-30 NOTE — Progress Notes (Signed)
Patient was recently seen by Dr. Meredeth IdeFleming for abnormal CT scan of his lungs regarding pulmonary nodules. Patient offers no complaints today.

## 2016-08-30 NOTE — Patient Instructions (Signed)

## 2016-09-27 ENCOUNTER — Inpatient Hospital Stay: Payer: Medicare PPO | Attending: Hematology and Oncology

## 2016-09-27 VITALS — BP 114/64 | HR 68 | Temp 95.8°F | Resp 17

## 2016-09-27 DIAGNOSIS — Z79899 Other long term (current) drug therapy: Secondary | ICD-10-CM | POA: Diagnosis not present

## 2016-09-27 DIAGNOSIS — E538 Deficiency of other specified B group vitamins: Secondary | ICD-10-CM

## 2016-09-27 MED ORDER — CYANOCOBALAMIN 1000 MCG/ML IJ SOLN
INTRAMUSCULAR | Status: AC
  Filled 2016-09-27: qty 1

## 2016-09-27 MED ORDER — CYANOCOBALAMIN 1000 MCG/ML IJ SOLN
1000.0000 ug | Freq: Once | INTRAMUSCULAR | Status: AC
  Administered 2016-09-27: 1000 ug via INTRAMUSCULAR

## 2016-10-25 ENCOUNTER — Inpatient Hospital Stay: Payer: Medicare PPO | Attending: Hematology and Oncology

## 2016-10-25 VITALS — BP 104/61 | HR 88 | Resp 20

## 2016-10-25 DIAGNOSIS — E538 Deficiency of other specified B group vitamins: Secondary | ICD-10-CM | POA: Diagnosis not present

## 2016-10-25 DIAGNOSIS — Z79899 Other long term (current) drug therapy: Secondary | ICD-10-CM | POA: Diagnosis not present

## 2016-10-25 MED ORDER — CYANOCOBALAMIN 1000 MCG/ML IJ SOLN
1000.0000 ug | Freq: Once | INTRAMUSCULAR | Status: AC
  Administered 2016-10-25: 1000 ug via INTRAMUSCULAR
  Filled 2016-10-25: qty 1

## 2016-10-25 NOTE — Patient Instructions (Signed)
Cyanocobalamin, Vitamin B12 injection What is this medicine? CYANOCOBALAMIN (sye an oh koe BAL a min) is a man made form of vitamin B12. Vitamin B12 is used in the growth of healthy blood cells, nerve cells, and proteins in the body. It also helps with the metabolism of fats and carbohydrates. This medicine is used to treat people who can not absorb vitamin B12. This medicine may be used for other purposes; ask your health care provider or pharmacist if you have questions. COMMON BRAND NAME(S): B-12 Compliance Kit, B-12 Injection Kit, Cyomin, LA-12, Nutri-Twelve, Physicians EZ Use B-12, Primabalt What should I tell my health care provider before I take this medicine? They need to know if you have any of these conditions: -kidney disease -Leber's disease -megaloblastic anemia -an unusual or allergic reaction to cyanocobalamin, cobalt, other medicines, foods, dyes, or preservatives -pregnant or trying to get pregnant -breast-feeding How should I use this medicine? This medicine is injected into a muscle or deeply under the skin. It is usually given by a health care professional in a clinic or doctor's office. However, your doctor may teach you how to inject yourself. Follow all instructions. Talk to your pediatrician regarding the use of this medicine in children. Special care may be needed. Overdosage: If you think you have taken too much of this medicine contact a poison control center or emergency room at once. NOTE: This medicine is only for you. Do not share this medicine with others. What if I miss a dose? If you are given your dose at a clinic or doctor's office, call to reschedule your appointment. If you give your own injections and you miss a dose, take it as soon as you can. If it is almost time for your next dose, take only that dose. Do not take double or extra doses. What may interact with this medicine? -colchicine -heavy alcohol intake This list may not describe all possible  interactions. Give your health care provider a list of all the medicines, herbs, non-prescription drugs, or dietary supplements you use. Also tell them if you smoke, drink alcohol, or use illegal drugs. Some items may interact with your medicine. What should I watch for while using this medicine? Visit your doctor or health care professional regularly. You may need blood work done while you are taking this medicine. You may need to follow a special diet. Talk to your doctor. Limit your alcohol intake and avoid smoking to get the best benefit. What side effects may I notice from receiving this medicine? Side effects that you should report to your doctor or health care professional as soon as possible: -allergic reactions like skin rash, itching or hives, swelling of the face, lips, or tongue -blue tint to skin -chest tightness, pain -difficulty breathing, wheezing -dizziness -red, swollen painful area on the leg Side effects that usually do not require medical attention (report to your doctor or health care professional if they continue or are bothersome): -diarrhea -headache This list may not describe all possible side effects. Call your doctor for medical advice about side effects. You may report side effects to FDA at 1-800-FDA-1088. Where should I keep my medicine? Keep out of the reach of children. Store at room temperature between 15 and 30 degrees C (59 and 85 degrees F). Protect from light. Throw away any unused medicine after the expiration date. NOTE: This sheet is a summary. It may not cover all possible information. If you have questions about this medicine, talk to your doctor, pharmacist, or   health care provider.  2018 Elsevier/Gold Standard (2007-05-20 22:10:20)  

## 2016-11-13 ENCOUNTER — Inpatient Hospital Stay
Admission: AD | Admit: 2016-11-13 | Discharge: 2016-11-17 | DRG: 982 | Disposition: A | Discharge status: Skilled Nursing Facility | Payer: Medicare PPO | Source: Ambulatory Visit | Attending: Internal Medicine | Admitting: Internal Medicine

## 2016-11-13 ENCOUNTER — Inpatient Hospital Stay: Payer: Medicare PPO

## 2016-11-13 DIAGNOSIS — E11621 Type 2 diabetes mellitus with foot ulcer: Secondary | ICD-10-CM | POA: Diagnosis present

## 2016-11-13 DIAGNOSIS — L02612 Cutaneous abscess of left foot: Secondary | ICD-10-CM | POA: Diagnosis present

## 2016-11-13 DIAGNOSIS — L97509 Non-pressure chronic ulcer of other part of unspecified foot with unspecified severity: Secondary | ICD-10-CM

## 2016-11-13 DIAGNOSIS — E785 Hyperlipidemia, unspecified: Secondary | ICD-10-CM | POA: Diagnosis present

## 2016-11-13 DIAGNOSIS — I509 Heart failure, unspecified: Secondary | ICD-10-CM | POA: Diagnosis present

## 2016-11-13 DIAGNOSIS — I482 Chronic atrial fibrillation: Secondary | ICD-10-CM | POA: Diagnosis present

## 2016-11-13 DIAGNOSIS — F419 Anxiety disorder, unspecified: Secondary | ICD-10-CM | POA: Diagnosis present

## 2016-11-13 DIAGNOSIS — Z23 Encounter for immunization: Secondary | ICD-10-CM

## 2016-11-13 DIAGNOSIS — Z7982 Long term (current) use of aspirin: Secondary | ICD-10-CM | POA: Diagnosis not present

## 2016-11-13 DIAGNOSIS — M79605 Pain in left leg: Secondary | ICD-10-CM | POA: Diagnosis present

## 2016-11-13 DIAGNOSIS — Z79899 Other long term (current) drug therapy: Secondary | ICD-10-CM | POA: Diagnosis not present

## 2016-11-13 DIAGNOSIS — I110 Hypertensive heart disease with heart failure: Secondary | ICD-10-CM | POA: Diagnosis present

## 2016-11-13 DIAGNOSIS — E11628 Type 2 diabetes mellitus with other skin complications: Secondary | ICD-10-CM | POA: Diagnosis present

## 2016-11-13 DIAGNOSIS — K219 Gastro-esophageal reflux disease without esophagitis: Secondary | ICD-10-CM | POA: Diagnosis present

## 2016-11-13 DIAGNOSIS — R339 Retention of urine, unspecified: Secondary | ICD-10-CM | POA: Diagnosis present

## 2016-11-13 DIAGNOSIS — L03116 Cellulitis of left lower limb: Secondary | ICD-10-CM | POA: Diagnosis present

## 2016-11-13 DIAGNOSIS — L97529 Non-pressure chronic ulcer of other part of left foot with unspecified severity: Secondary | ICD-10-CM | POA: Diagnosis present

## 2016-11-13 LAB — COMPREHENSIVE METABOLIC PANEL
ALBUMIN: 3.2 g/dL — AB (ref 3.5–5.0)
ALK PHOS: 152 U/L — AB (ref 38–126)
ALT: 29 U/L (ref 17–63)
ANION GAP: 7 (ref 5–15)
AST: 26 U/L (ref 15–41)
BILIRUBIN TOTAL: 1.4 mg/dL — AB (ref 0.3–1.2)
BUN: 19 mg/dL (ref 6–20)
CALCIUM: 8.8 mg/dL — AB (ref 8.9–10.3)
CO2: 25 mmol/L (ref 22–32)
Chloride: 106 mmol/L (ref 101–111)
Creatinine, Ser: 1.01 mg/dL (ref 0.61–1.24)
GFR calc Af Amer: >60 mL/min (ref >60)
GLUCOSE: 108 mg/dL — AB (ref 65–99)
POTASSIUM: 4.2 mmol/L (ref 3.5–5.1)
Sodium: 138 mmol/L (ref 135–145)
Total Protein: 6.6 g/dL (ref 6.5–8.1)

## 2016-11-13 LAB — GLUCOSE, CAPILLARY
GLUCOSE-CAPILLARY: 157 mg/dL — AB (ref 65–99)
Glucose-Capillary: 94 mg/dL (ref 65–99)
Glucose-Capillary: 135 mg/dL — ABNORMAL HIGH (ref 65–99)

## 2016-11-13 LAB — CBC WITH DIFFERENTIAL/PLATELET
BASOS PCT: 1 %
Basophils Absolute: 0 10*3/uL (ref 0–0.1)
Eosinophils Absolute: 0.1 10*3/uL (ref 0–0.7)
Eosinophils Relative: 1 %
HEMATOCRIT: 35.3 % — AB (ref 40.0–52.0)
HEMOGLOBIN: 12.6 g/dL — AB (ref 13.0–18.0)
LYMPHS ABS: 1.3 10*3/uL (ref 1.0–3.6)
LYMPHS PCT: 18 %
MCH: 32.1 pg (ref 26.0–34.0)
MCHC: 35.6 g/dL (ref 32.0–36.0)
MCV: 90 fL (ref 80.0–100.0)
MONO ABS: 0.6 10*3/uL (ref 0.2–1.0)
MONOS PCT: 8 %
NEUTROS ABS: 5.5 10*3/uL (ref 1.4–6.5)
NEUTROS PCT: 72 %
Platelets: 131 10*3/uL — ABNORMAL LOW (ref 150–440)
RBC: 3.92 MIL/uL — ABNORMAL LOW (ref 4.40–5.90)
RDW: 13 % (ref 11.5–14.5)
WBC: 7.6 10*3/uL (ref 3.8–10.6)

## 2016-11-13 MED ORDER — OXYCODONE-ACETAMINOPHEN 5-325 MG PO TABS
2.0000 | ORAL_TABLET | Freq: Four times a day (QID) | ORAL | Status: DC | PRN
  Administered 2016-11-13: 18:00:00 2 via ORAL
  Filled 2016-11-13: qty 2

## 2016-11-13 MED ORDER — KETOROLAC TROMETHAMINE 30 MG/ML IJ SOLN
15.0000 mg | Freq: Four times a day (QID) | INTRAMUSCULAR | Status: DC | PRN
  Administered 2016-11-14 – 2016-11-15 (×3): 15 mg via INTRAVENOUS
  Filled 2016-11-13 (×3): qty 1

## 2016-11-13 MED ORDER — ROPINIROLE HCL 0.25 MG PO TABS
0.2500 mg | ORAL_TABLET | Freq: Every day | ORAL | Status: DC
  Filled 2016-11-13: qty 1

## 2016-11-13 MED ORDER — DUTASTERIDE 0.5 MG PO CAPS
0.5000 mg | ORAL_CAPSULE | Freq: Every day | ORAL | Status: DC
  Administered 2016-11-13 – 2016-11-17 (×4): 0.5 mg via ORAL
  Filled 2016-11-13 (×5): qty 1

## 2016-11-13 MED ORDER — PANTOPRAZOLE SODIUM 40 MG PO TBEC
40.0000 mg | DELAYED_RELEASE_TABLET | Freq: Every day | ORAL | Status: DC
  Administered 2016-11-13 – 2016-11-17 (×4): 40 mg via ORAL
  Filled 2016-11-13 (×4): qty 1

## 2016-11-13 MED ORDER — POLYETHYLENE GLYCOL 3350 17 G PO PACK: 17.0000 g | PACK | Freq: Every day | ORAL | Status: DC | PRN

## 2016-11-13 MED ORDER — ROPINIROLE HCL 0.25 MG PO TABS
0.2500 mg | ORAL_TABLET | Freq: Every evening | ORAL | Status: DC | PRN
  Filled 2016-11-13: qty 1

## 2016-11-13 MED ORDER — INSULIN ASPART 100 UNIT/ML ~~LOC~~ SOLN
0.0000 [IU] | Freq: Three times a day (TID) | SUBCUTANEOUS | Status: DC
  Administered 2016-11-14: 18:00:00 1 [IU] via SUBCUTANEOUS
  Filled 2016-11-13: qty 1

## 2016-11-13 MED ORDER — MUPIROCIN 2 % EX OINT
1.0000 "application " | TOPICAL_OINTMENT | Freq: Three times a day (TID) | CUTANEOUS | Status: DC
  Filled 2016-11-13: qty 22

## 2016-11-13 MED ORDER — VANCOMYCIN HCL IN DEXTROSE 1-5 GM/200ML-% IV SOLN
1000.0000 mg | Freq: Once | INTRAVENOUS | Status: AC
  Administered 2016-11-13: 1000 mg via INTRAVENOUS
  Filled 2016-11-13: qty 200

## 2016-11-13 MED ORDER — ASPIRIN 81 MG PO CHEW
81.0000 mg | CHEWABLE_TABLET | Freq: Every day | ORAL | Status: DC
  Administered 2016-11-15 – 2016-11-17 (×3): 81 mg via ORAL
  Filled 2016-11-13 (×4): qty 1

## 2016-11-13 MED ORDER — CYANOCOBALAMIN 1000 MCG/ML IJ SOLN: 1000.0000 ug | INTRAMUSCULAR | Status: DC

## 2016-11-13 MED ORDER — ACETAMINOPHEN 325 MG PO TABS: 650.0000 mg | ORAL_TABLET | Freq: Four times a day (QID) | ORAL | Status: DC | PRN

## 2016-11-13 MED ORDER — VANCOMYCIN HCL IN DEXTROSE 1-5 GM/200ML-% IV SOLN
1000.0000 mg | Freq: Two times a day (BID) | INTRAVENOUS | Status: DC
  Administered 2016-11-13 – 2016-11-16 (×6): 1000 mg via INTRAVENOUS
  Filled 2016-11-13 (×8): qty 200

## 2016-11-13 MED ORDER — ONDANSETRON HCL 4 MG/2ML IJ SOLN: 4.0000 mg | Freq: Four times a day (QID) | INTRAMUSCULAR | Status: DC | PRN

## 2016-11-13 MED ORDER — INFLUENZA VAC SPLIT HIGH-DOSE 0.5 ML IM SUSY
0.5000 mL | PREFILLED_SYRINGE | INTRAMUSCULAR | Status: AC
  Administered 2016-11-15: 11:00:00 0.5 mL via INTRAMUSCULAR
  Filled 2016-11-13: qty 0.5

## 2016-11-13 MED ORDER — PIPERACILLIN-TAZOBACTAM 3.375 G IVPB
3.3750 g | Freq: Once | INTRAVENOUS | Status: AC
  Administered 2016-11-13: 3.375 g via INTRAVENOUS
  Filled 2016-11-13: qty 50

## 2016-11-13 MED ORDER — FERROUS SULFATE 325 (65 FE) MG PO TABS
325.0000 mg | ORAL_TABLET | Freq: Every day | ORAL | Status: DC
  Administered 2016-11-15 – 2016-11-17 (×3): 325 mg via ORAL
  Filled 2016-11-13 (×3): qty 1

## 2016-11-13 MED ORDER — ONDANSETRON HCL 4 MG PO TABS: 4.0000 mg | ORAL_TABLET | Freq: Four times a day (QID) | ORAL | Status: DC | PRN

## 2016-11-13 MED ORDER — PIPERACILLIN-TAZOBACTAM 3.375 G IVPB
3.3750 g | Freq: Three times a day (TID) | INTRAVENOUS | Status: DC
  Administered 2016-11-13 – 2016-11-16 (×8): 3.375 g via INTRAVENOUS
  Filled 2016-11-13 (×8): qty 50

## 2016-11-13 MED ORDER — FUROSEMIDE 20 MG PO TABS: 20.0000 mg | ORAL_TABLET | Freq: Every day | ORAL | Status: DC | PRN

## 2016-11-13 MED ORDER — INSULIN ASPART 100 UNIT/ML ~~LOC~~ SOLN: 0.0000 [IU] | Freq: Every day | SUBCUTANEOUS | Status: DC

## 2016-11-13 MED ORDER — CYCLOBENZAPRINE HCL 10 MG PO TABS: 10.0000 mg | ORAL_TABLET | Freq: Three times a day (TID) | ORAL | Status: DC | PRN

## 2016-11-13 MED ORDER — DORZOLAMIDE HCL-TIMOLOL MAL 2-0.5 % OP SOLN
1.0000 [drp] | Freq: Two times a day (BID) | OPHTHALMIC | Status: DC
  Administered 2016-11-13 – 2016-11-17 (×9): 1 [drp] via OPHTHALMIC
  Filled 2016-11-13: qty 10

## 2016-11-13 MED ORDER — LATANOPROST 0.005 % OP SOLN
1.0000 [drp] | Freq: Every day | OPHTHALMIC | Status: DC
  Administered 2016-11-13 – 2016-11-16 (×4): 1 [drp] via OPHTHALMIC
  Filled 2016-11-13: qty 2.5

## 2016-11-13 MED ORDER — GABAPENTIN 100 MG PO CAPS
100.0000 mg | ORAL_CAPSULE | Freq: Three times a day (TID) | ORAL | Status: DC
  Filled 2016-11-13: qty 1

## 2016-11-13 NOTE — Plan of Care (Signed)
Problem: Pain Managment: Goal: General experience of comfort will improve Outcome: Progressing Percocet given once for L foot pain with good effect.  Problem: Physical Regulation: Goal: Ability to maintain clinical measurements within normal limits will improve Outcome: Progressing Direct admit from [facility]. VSS. A&Ox4.

## 2016-11-13 NOTE — Progress Notes (Signed)
Pharmacy Antibiotic Note  Jeremy Herrera is a 76 y.o. male admitted on 11/13/2016 with wound infection.  Pharmacy has been consulted for Zosyn and vancomycin dosing.  Plan: 1. Zosyn 3.375 gm IV Q8H EI 2. Vancomycin 1 gm IV x 1 followed in approximately 6 hours (stacked dosing) by vancomycin 1 gm IV Q12H, predicted trough 16 mcg/mL. Pharmacy will continue to follow and adjust as needed to maintain trough 15 to 20 mcg/mL.   Vd 57.6 L, Ke 0.064 hr-1, T1/2 10.9 hr  Height:  (188 cm) Weight: 209 lb 4.8 oz (94.9 kg) IBW/kg (Calculated) : 82.2  Temp (24hrs), Avg:97.7 F (36.5 C), Min:97.7 F (36.5 C), Max:97.7 F (36.5 C)   Recent Labs Lab 11/13/16 1514  WBC 7.6  CREATININE 1.01    Estimated Creatinine Clearance: 71.2 mL/min (by C-G formula based on SCr of 1.01 mg/dL).    Allergies  Allergen Reactions  . Hydrocodone-Acetaminophen Nausea And Vomiting   Thank you for allowing pharmacy to be a part of this patient's care.  Carola Frost, Pharm.D., BCPS Clinical Pharmacist 11/13/2016 4:15 PM

## 2016-11-13 NOTE — H&P (Signed)
[name] NAME: Jeremy Herrera    MR#:  [id number]  DATE OF BIRTH:  September 18, 1940  DATE OF ADMISSION:  11/13/2016  PRIMARY CARE PHYSICIAN: Jeremy Hartigan, MD   REQUESTING/REFERRING PHYSICIAN: Vickki Muff  CHIEF COMPLAINT:  Lt foot pain  HISTORY OF PRESENT ILLNESS:  Jeremy Herrera  is a 76 y.o. male with a known history of diet-controlled diabetes mellitus, hyperlipidemia, hypertension,chronic atrial fibrillation on aspirin is presenting to the hospital as a direct admit from Dr. Alvera Singh office. Patient went to see Dr. Vickki Muff today for acute left leg pain and infection. The left leg wound was dressed and patient is sent over to the hospital as a direct admit to rule out osteomyelitis  PAST MEDICAL HISTORY:   Past Medical History:  Diagnosis Date  . Anemia   . BPH (benign prostatic hyperplasia)   . CHF (congestive heart failure) ([place])   . Dysphagia   . GERD (gastroesophageal reflux disease)   . HLD (hyperlipidemia)   . HTN (hypertension)   . Irregular heart beat   . Nocturia   . Urinary retention     PAST SURGICAL HISTOIRY:   Past Surgical History:  Procedure Laterality Date  . bladder patching  1964  . HIP SURGERY Right 2014  . TRANSURETHRAL RESECTION OF PROSTATE      SOCIAL HISTORY:   Social History  Substance Use Topics  . Smoking status: Never Smoker  . Smokeless tobacco: Never Used  . Alcohol use No    FAMILY HISTORY:   Family History  Problem Relation Age of Onset  . Ulcers Mother        stomach  . GER disease Sister        GERD  . Heart attack Father   . Diabetes Brother   . Depression Brother   . Kidney disease Neg Hx   . Prostate cancer Neg Hx   . Kidney cancer Neg Hx   . Bladder Cancer Neg Hx     DRUG ALLERGIES:   Allergies  Allergen Reactions  . Hydrocodone-Acetaminophen Nausea And Vomiting    REVIEW OF SYSTEMS:  CONSTITUTIONAL: No fever, fatigue or weakness.  EYES: No  blurred or double vision.  EARS, NOSE, AND THROAT: No tinnitus or ear pain.  RESPIRATORY: No cough, shortness of breath, wheezing or hemoptysis.  CARDIOVASCULAR: No chest pain, orthopnea, edema.  GASTROINTESTINAL: No nausea, vomiting, diarrhea or abdominal pain.  GENITOURINARY: No dysuria, hematuria.  ENDOCRINE: No polyuria, nocturia,  HEMATOLOGY: No anemia, easy bruising or bleeding SKIN: No rash or lesion. MUSCULOSKELETAL: left leg pain and wound   NEUROLOGIC: No tingling, numbness, weakness.  PSYCHIATRY: No anxiety or depression.   MEDICATIONS AT HOME:   Prior to Admission medications   Medication Sig Start Date End Date Taking? Authorizing Provider  acetaminophen (TYLENOL) 500 MG tablet Take by mouth.    [provider]  aspirin 81 MG tablet Take 81 mg by mouth daily.    [provider]  bimatoprost (LUMIGAN) 0.01 % SOLN Place 1 drop into both eyes 2 (two) times daily.     [provider]  Cyanocobalamin (B-12) 1000 MCG/ML KIT Inject 1 mL as directed every 30 (thirty) days.    [provider]  cyclobenzaprine (FLEXERIL) 10 MG tablet Take 1 tablet (10 mg total) by mouth 3 (three) times daily as needed for muscle spasms. 04/12/16   Earleen Newport, MD  dorzolamide-timolol (COSOPT) 22.3-6.8 MG/ML ophthalmic solution Place 1 drop  into both eyes 2 (two) times daily.     [provider]  dutasteride (AVODART) 0.5 MG capsule Take 1 capsule (0.5 mg total) by mouth daily. 07/08/15   Zara Council A, PA-C  esomeprazole (NEXIUM) 20 MG capsule Take 20 mg by mouth daily at 12 noon.    [provider]  ferrous sulfate 325 (65 FE) MG tablet Take 325 mg by mouth daily.    [provider]  furosemide (LASIX) 20 MG tablet Take 20 mg by mouth daily as needed for fluid. Try to limit to no more than 3-4 times per week. 11/23/14   [provider]  gabapentin (NEURONTIN) 100 MG capsule  04/01/15   [provider]  mupirocin  ointment (BACTROBAN) 2 % Apply 1 application topically 3 (three) times daily. 05/05/16   [name], PA-C  Naproxen Sodium (ALEVE) 220 MG CAPS Take 1 capsule by mouth as needed.    [provider]  oxyCODONE-acetaminophen (PERCOCET) 5-325 MG tablet Take 2 tablets by mouth every 6 (six) hours as needed for moderate pain or severe pain. 04/12/16   Earleen Newport, MD  rOPINIRole (REQUIP) 0.25 MG tablet Take 0.25 mg by mouth. 05/07/16 05/07/17  [provider]  tadalafil (CIALIS) 5 MG tablet Take 1 tablet (5 mg total) by mouth daily. 07/08/15   McGowan, Larene Beach A, PA-C      VITAL SIGNS:  Blood pressure (!) 156/96, pulse 97, temperature 97.7 F (36.5 C), temperature source Oral, resp. rate (!) 24, SpO2 100 %.  PHYSICAL EXAMINATION:  GENERAL:  76 y.o.-year-old patient lying in the bed with no acute distress.  EYES: Pupils equal, round, reactive to light and accommodation. No scleral icterus. Extraocular muscles intact.  HEENT: Head atraumatic, normocephalic. Oropharynx and nasopharynx clear.  NECK:  Supple, no jugular venous distention. No thyroid enlargement, no tenderness.  LUNGS: Normal breath sounds bilaterally, no wheezing, rales,rhonchi or crepitation. No use of accessory muscles of respiration.  CARDIOVASCULAR: S1, S2 normal. No murmurs, rubs, or gallops.  ABDOMEN: Soft, nontender, nondistended. Bowel sounds present. No organomegaly or mass.  EXTREMITIES: lt foot in clean dressing done by dr.Fowler today  NEUROLOGIC: Cranial nerves II through XII are intact. Muscle strength 5/5 in all extremities. Sensation intact. Gait not checked.  PSYCHIATRIC: The patient is alert and oriented x 3.  SKIN: No obvious rash, lesion, or ulcer.   LABORATORY PANEL:   CBC No results for input(s): WBC, HGB, HCT, PLT in the last 168 hours. ------------------------------------------------------------------------------------------------------------------  Chemistries  No results for  input(s): NA, K, CL, CO2, GLUCOSE, BUN, CREATININE, CALCIUM, MG, AST, ALT, ALKPHOS, BILITOT in the last 168 hours.  Invalid input(s): GFRCGP ------------------------------------------------------------------------------------------------------------------  Cardiac Enzymes No results for input(s): TROPONINI in the last 168 hours. ------------------------------------------------------------------------------------------------------------------  RADIOLOGY:  No results found.  EKG:   Orders placed or performed during the hospital encounter of 04/12/16  . ED EKG  . ED EKG    IMPRESSION AND PLAN:   Emonte Dieujuste  is a 76 y.o. male with a known history of diet-controlled diabetes mellitus, hyperlipidemia, hypertension,chronic atrial fibrillation on aspirin is presenting to the hospital as a direct admit from Dr. Alvera Singh office. Patient went to see Dr. Vickki Muff today for acute left leg pain and infection. The left leg wound was dressed and patient is sent over to the hospital as a direct admit to rule out osteomyelitis   # Acute lt foot pain 2/2 lt foot infection Admit patient to MedSurg unit Get labs and MRI  of the left foot Empiric IV antibiotics Zosyn and vancomycin Consult placed to Dr. Vickki Muff podiatry Pain management as needed Nothing by mouth after midnight for possible I&D in a.m.  #Diabetes mellitus diet controlled Check hemoglobin A1c Sliding scale insulin  #Chronic atrial fibrillation rate controlled Patient is on aspirin which will be on hold for possible I&D tomorrow Sees Dr. Nehemiah Massed as an outpatient  #essential hypertension Continue home medication Avodart    DVT prophylaxis with SCDs GI prophylaxis with Nexium  All the records are reviewed and case discussed with ED provider. Management plans discussed with the patient, son at bedside and they are in agreement.  CODE STATUS: fc, son Jeremy Herrera is HCPOA   TOTAL TIME TAKING CARE OF THIS PATIENT: 45   minutes.   Note: This dictation was prepared with Dragon dictation along with smaller phrase technology. Any transcriptional errors that result from this process are unintentional.  [name] M.D on 11/13/2016 at 2:01 PM  Between 7am to 6pm - Pager - [phone]  After 6pm go to [email or web address] - password [email or web address] [facility]  [place] [facility]  Office  [phone]  CC: Primary care physician; Jeremy Hartigan, MD

## 2016-11-13 NOTE — Consult Note (Signed)
Pt admitted from my office with left foot infection. MRI has been ordered. Will see tomorrow am.  Have tentatively added pt on to OR for lunch tomorrow.  NPO after midnight tonight.

## 2016-11-14 ENCOUNTER — Inpatient Hospital Stay: Payer: Medicare PPO | Admitting: Anesthesiology

## 2016-11-14 ENCOUNTER — Encounter
Admission: AD | Disposition: A | Discharge status: Skilled Nursing Facility | Payer: Self-pay | Source: Ambulatory Visit | Attending: Internal Medicine

## 2016-11-14 ENCOUNTER — Encounter: Payer: Self-pay | Admitting: Anesthesiology

## 2016-11-14 HISTORY — PX: IRRIGATION AND DEBRIDEMENT FOOT: SHX6602

## 2016-11-14 LAB — HEMOGLOBIN A1C
HEMOGLOBIN A1C: 5.3 % (ref 4.8–5.6)
MEAN PLASMA GLUCOSE: 105.41 mg/dL

## 2016-11-14 LAB — GLUCOSE, CAPILLARY
GLUCOSE-CAPILLARY: 102 mg/dL — AB (ref 65–99)
GLUCOSE-CAPILLARY: 158 mg/dL — AB (ref 65–99)
Glucose-Capillary: 128 mg/dL — ABNORMAL HIGH (ref 65–99)

## 2016-11-14 LAB — SURGICAL PCR SCREEN
MRSA, PCR: NEGATIVE
Staphylococcus aureus: NEGATIVE

## 2016-11-14 LAB — TSH: TSH: 0.795 u[IU]/mL (ref 0.350–4.500)

## 2016-11-14 SURGERY — IRRIGATION AND DEBRIDEMENT FOOT
Anesthesia: General | Laterality: Left | Wound class: Dirty or Infected

## 2016-11-14 MED ORDER — FENTANYL CITRATE (PF) 100 MCG/2ML IJ SOLN
INTRAMUSCULAR | Status: DC | PRN
  Administered 2016-11-14 (×4): 25 ug via INTRAVENOUS

## 2016-11-14 MED ORDER — GLYCOPYRROLATE 0.2 MG/ML IJ SOLN
INTRAMUSCULAR | Status: DC | PRN
  Administered 2016-11-14: 0.2 mg via INTRAVENOUS

## 2016-11-14 MED ORDER — CHLORHEXIDINE GLUCONATE 4 % EX LIQD: 60.0000 mL | Freq: Once | CUTANEOUS | Status: DC

## 2016-11-14 MED ORDER — BUPIVACAINE HCL 0.5 % IJ SOLN
INTRAMUSCULAR | Status: DC | PRN
  Administered 2016-11-14: 10 mL
  Administered 2016-11-14: 5 mL

## 2016-11-14 MED ORDER — LIDOCAINE HCL (PF) 1 % IJ SOLN
INTRAMUSCULAR | Status: AC
  Filled 2016-11-14: qty 30

## 2016-11-14 MED ORDER — PHENYLEPHRINE HCL 10 MG/ML IJ SOLN
INTRAMUSCULAR | Status: DC | PRN
  Administered 2016-11-14: 100 ug via INTRAVENOUS
  Administered 2016-11-14 (×2): 200 ug via INTRAVENOUS
  Administered 2016-11-14 (×2): 100 ug via INTRAVENOUS

## 2016-11-14 MED ORDER — LIDOCAINE-EPINEPHRINE 1 %-1:100000 IJ SOLN
INTRAMUSCULAR | Status: DC | PRN
  Administered 2016-11-14: 5 mL

## 2016-11-14 MED ORDER — DEXAMETHASONE SODIUM PHOSPHATE 10 MG/ML IJ SOLN
INTRAMUSCULAR | Status: AC
  Filled 2016-11-14: qty 1

## 2016-11-14 MED ORDER — LACTATED RINGERS IV SOLN
INTRAVENOUS | Status: DC
  Administered 2016-11-14 (×2): via INTRAVENOUS

## 2016-11-14 MED ORDER — MIDAZOLAM HCL 2 MG/2ML IJ SOLN
INTRAMUSCULAR | Status: AC
  Filled 2016-11-14: qty 2

## 2016-11-14 MED ORDER — BUPIVACAINE HCL (PF) 0.5 % IJ SOLN
INTRAMUSCULAR | Status: AC
  Filled 2016-11-14: qty 30

## 2016-11-14 MED ORDER — OXYCODONE-ACETAMINOPHEN 5-325 MG PO TABS: 1.0000 | ORAL_TABLET | Freq: Four times a day (QID) | ORAL | Status: DC | PRN

## 2016-11-14 MED ORDER — ACETAMINOPHEN 325 MG PO TABS: 650.0000 mg | ORAL_TABLET | Freq: Four times a day (QID) | ORAL | Status: DC | PRN

## 2016-11-14 MED ORDER — PROPOFOL 500 MG/50ML IV EMUL
INTRAVENOUS | Status: AC
  Filled 2016-11-14: qty 50

## 2016-11-14 MED ORDER — DEXAMETHASONE SODIUM PHOSPHATE 10 MG/ML IJ SOLN
INTRAMUSCULAR | Status: DC | PRN
  Administered 2016-11-14: 5 mg via INTRAVENOUS

## 2016-11-14 MED ORDER — FENTANYL CITRATE (PF) 100 MCG/2ML IJ SOLN
INTRAMUSCULAR | Status: AC
  Filled 2016-11-14: qty 2

## 2016-11-14 MED ORDER — ONDANSETRON HCL 4 MG/2ML IJ SOLN
INTRAMUSCULAR | Status: AC
  Filled 2016-11-14: qty 2

## 2016-11-14 MED ORDER — LIDOCAINE-EPINEPHRINE 1 %-1:100000 IJ SOLN
INTRAMUSCULAR | Status: AC
  Filled 2016-11-14: qty 1

## 2016-11-14 MED ORDER — GLYCOPYRROLATE 0.2 MG/ML IJ SOLN
INTRAMUSCULAR | Status: AC
Start: 2016-11-14 — End: 2016-11-14
  Filled 2016-11-14: qty 1

## 2016-11-14 MED ORDER — FENTANYL CITRATE (PF) 100 MCG/2ML IJ SOLN: 25.0000 ug | INTRAMUSCULAR | Status: DC | PRN

## 2016-11-14 MED ORDER — LIDOCAINE HCL (CARDIAC) 20 MG/ML IV SOLN
INTRAVENOUS | Status: DC | PRN
  Administered 2016-11-14: 40 mg via INTRAVENOUS

## 2016-11-14 MED ORDER — ONDANSETRON HCL 4 MG/2ML IJ SOLN: 4.0000 mg | Freq: Once | INTRAMUSCULAR | Status: DC | PRN

## 2016-11-14 MED ORDER — PROPOFOL 10 MG/ML IV BOLUS
INTRAVENOUS | Status: DC | PRN
  Administered 2016-11-14: 100 mg via INTRAVENOUS

## 2016-11-14 MED ORDER — ONDANSETRON HCL 4 MG/2ML IJ SOLN
INTRAMUSCULAR | Status: DC | PRN
  Administered 2016-11-14: 4 mg via INTRAVENOUS

## 2016-11-14 MED ORDER — CEFAZOLIN SODIUM-DEXTROSE 2-4 GM/100ML-% IV SOLN
2.0000 g | INTRAVENOUS | Status: AC
Start: 2016-11-14 — End: 2016-11-14
  Administered 2016-11-14: 2 g via INTRAVENOUS
  Filled 2016-11-14: qty 100

## 2016-11-14 MED ORDER — LIDOCAINE HCL (PF) 2 % IJ SOLN
INTRAMUSCULAR | Status: AC
  Filled 2016-11-14: qty 2

## 2016-11-14 SURGICAL SUPPLY — 58 items
BANDAGE ACE 4X5 VEL STRL LF (GAUZE/BANDAGES/DRESSINGS) ×2 IMPLANT
BANDAGE STRETCH 3X4.1 STRL (GAUZE/BANDAGES/DRESSINGS) ×2 IMPLANT
BLADE OSC/SAGITTAL MD 5.5X18 (BLADE) IMPLANT
BLADE OSCILLATING/SAGITTAL (BLADE)
BLADE SW THK.38XMED LNG THN (BLADE) IMPLANT
BNDG COHESIVE 4X5 TAN STRL (GAUZE/BANDAGES/DRESSINGS) ×2 IMPLANT
BNDG COHESIVE 6X5 TAN STRL LF (GAUZE/BANDAGES/DRESSINGS) ×2 IMPLANT
BNDG ESMARK 4X12 TAN STRL LF (GAUZE/BANDAGES/DRESSINGS) ×2 IMPLANT
BNDG GAUZE 4.5X4.1 6PLY STRL (MISCELLANEOUS) ×2 IMPLANT
CANISTER SUCT 1200ML W/VALVE (MISCELLANEOUS) ×2 IMPLANT
CANISTER SUCT 3000ML PPV (MISCELLANEOUS) ×2 IMPLANT
CUFF TOURN 18 STER (MISCELLANEOUS) ×2 IMPLANT
CUFF TOURN DUAL PL 12 NO SLV (MISCELLANEOUS) ×2 IMPLANT
DRAPE FLUOR MINI C-ARM 54X84 (DRAPES) IMPLANT
DRAPE XRAY CASSETTE 23X24 (DRAPES) IMPLANT
DRESSING ALLEVYN 4X4 (MISCELLANEOUS) IMPLANT
DURAPREP 26ML APPLICATOR (WOUND CARE) ×2 IMPLANT
ELECT REM PT RETURN 9FT ADLT (ELECTROSURGICAL) ×2
ELECTRODE REM PT RTRN 9FT ADLT (ELECTROSURGICAL) ×1 IMPLANT
GAUZE PACKING 1/4 X5 YD (GAUZE/BANDAGES/DRESSINGS) ×2 IMPLANT
GAUZE PACKING IODOFORM 1X5 (MISCELLANEOUS) ×2 IMPLANT
GAUZE PETRO XEROFOAM 1X8 (MISCELLANEOUS) ×2 IMPLANT
GAUZE SPONGE 4X4 12PLY STRL (GAUZE/BANDAGES/DRESSINGS) ×2 IMPLANT
GAUZE STRETCH 2X75IN STRL (MISCELLANEOUS) ×2 IMPLANT
GLOVE BIO SURGEON STRL SZ7.5 (GLOVE) ×2 IMPLANT
GLOVE INDICATOR 8.0 STRL GRN (GLOVE) ×2 IMPLANT
GOWN STRL REUS W/ TWL LRG LVL3 (GOWN DISPOSABLE) ×2 IMPLANT
GOWN STRL REUS W/TWL LRG LVL3 (GOWN DISPOSABLE) ×2
GOWN STRL REUS W/TWL MED LVL3 (GOWN DISPOSABLE) ×4 IMPLANT
HANDPIECE VERSAJET DEBRIDEMENT (MISCELLANEOUS) ×2 IMPLANT
IV NS 1000ML (IV SOLUTION) ×1
IV NS 1000ML BAXH (IV SOLUTION) ×1 IMPLANT
KIT RM TURNOVER STRD PROC AR (KITS) ×2 IMPLANT
LABEL OR SOLS (LABEL) ×2 IMPLANT
NEEDLE FILTER BLUNT 18X 1/2SAF (NEEDLE) ×1
NEEDLE FILTER BLUNT 18X1 1/2 (NEEDLE) ×1 IMPLANT
NEEDLE HYPO 25X1 1.5 SAFETY (NEEDLE) ×2 IMPLANT
NS IRRIG 500ML POUR BTL (IV SOLUTION) ×2 IMPLANT
PACK EXTREMITY ARMC (MISCELLANEOUS) ×2 IMPLANT
PAD ABD DERMACEA PRESS 5X9 (GAUZE/BANDAGES/DRESSINGS) ×2 IMPLANT
PULSAVAC PLUS IRRIG FAN TIP (DISPOSABLE) ×2
RASP SM TEAR CROSS CUT (RASP) IMPLANT
SOL .9 NS 3000ML IRR  AL (IV SOLUTION) ×1
SOL .9 NS 3000ML IRR UROMATIC (IV SOLUTION) ×1 IMPLANT
SOL PREP PVP 2OZ (MISCELLANEOUS) ×2
SOLUTION PREP PVP 2OZ (MISCELLANEOUS) ×1 IMPLANT
STOCKINETTE IMPERVIOUS 9X36 MD (GAUZE/BANDAGES/DRESSINGS) ×2 IMPLANT
SUT ETHILON 2 0 FS 18 (SUTURE) ×4 IMPLANT
SUT ETHILON 4-0 (SUTURE) ×1
SUT ETHILON 4-0 FS2 18XMFL BLK (SUTURE) ×1
SUT VIC AB 3-0 SH 27 (SUTURE) ×1
SUT VIC AB 3-0 SH 27X BRD (SUTURE) ×1 IMPLANT
SUT VIC AB 4-0 FS2 27 (SUTURE) ×2 IMPLANT
SUTURE ETHLN 4-0 FS2 18XMF BLK (SUTURE) ×1 IMPLANT
SWAB CULTURE AMIES ANAERIB BLU (MISCELLANEOUS) IMPLANT
SYR 3ML LL SCALE MARK (SYRINGE) ×2 IMPLANT
SYRINGE 10CC LL (SYRINGE) ×4 IMPLANT
TIP FAN IRRIG PULSAVAC PLUS (DISPOSABLE) ×1 IMPLANT

## 2016-11-14 NOTE — Op Note (Signed)
Operative note   Surgeon:Kahari Critzer    Assistant:none    Preop diagnosis:left foot ulcer with abscess    Postop diagnosis:same    Procedure:I&D plantar left foot ulcer with abscess    ZOX:[id number]    Anesthesia:local and general    Hemostasis:epinephrine infiltrated along incision site.    Specimen:deep wound culture    Complications:one    Operative indications:Jeremy Herrera is an 76 y.o. that presents today for surgical intervention.  The risks/benefits/alternatives/complications have been discussed and consent has been given.    Procedure:  Patient was brought into the OR and placed on the operating table in thesupine position. After anesthesia was obtained theleft lower extremity was prepped and draped in usual sterile fashion.  Attention was directed  Plantar aspect of the leftfoot where an ulceration was noted between the third and fourth metatarsal head region. At this time incision was made into the deep webspace region. Noted necrotic tissue was found in the area and debrided away with the versa jet. Further dissection was taken down into the webspace heading towards the dorsal aspect of the foot. Scant amount of purulent drainage was found in this area. Wounds were flushed with copious varus or irrigation. Further excisional debridement of the deep soft tissue of the ulcerative site was taken down to the tendon and capsular level. The incision measured approximately 4 cm in depth was approximately 2 cm. The incision was then closed with the central ulceration left open and packed withiodoform packing.    Patient tolerated the procedure and anesthesia well.  Was transported from the OR to the PACU with all vital signs stable and vascular status intact. To be discharged per routine protocol.  Will follow up in approximately 1 week in the outpatient clinic.

## 2016-11-14 NOTE — Consult Note (Signed)
ORTHOPAEDIC CONSULTATION  REQUESTING PHYSICIAN: Jeremy Mango, MD  Chief Complaint: Left foot infection  HPI: Jeremy Herrera is a 76 y.o. male who complains of  Infection left foot.  States has been present over a month.  Seen yesterday outpt clinic with pain, redness, swelling and foul odor.  Admitted for IV abx, MRI and debridment.  No appetite   Past Medical History:  Diagnosis Date  . Anemia   . BPH (benign prostatic hyperplasia)   . CHF (congestive heart failure) ([place])   . Dysphagia   . GERD (gastroesophageal reflux disease)   . HLD (hyperlipidemia)   . HTN (hypertension)   . Irregular heart beat   . Nocturia   . Urinary retention    Past Surgical History:  Procedure Laterality Date  . bladder patching  1964  . HIP SURGERY Right 2014  . TRANSURETHRAL RESECTION OF PROSTATE     Social History   Social History  . Marital status: Widowed    Spouse name: N/A  . Number of children: N/A  . Years of education: N/A   Social History Main Topics  . Smoking status: Never Smoker  . Smokeless tobacco: Never Used  . Alcohol use No  . Drug use: No  . Sexual activity: Not Currently   Other Topics Concern  . None   Social History Narrative  . None   Family History  Problem Relation Age of Onset  . Ulcers Mother        stomach  . GER disease Sister        GERD  . Heart attack Father   . Diabetes Brother   . Depression Brother   . Kidney disease Neg Hx   . Prostate cancer Neg Hx   . Kidney cancer Neg Hx   . Bladder Cancer Neg Hx    Allergies  Allergen Reactions  . Hydrocodone-Acetaminophen Nausea And Vomiting   Prior to Admission medications   Medication Sig Start Date End Date Taking? Authorizing Provider  aspirin 81 MG tablet Take 81 mg by mouth daily.   Yes [provider]  bimatoprost (LUMIGAN) 0.01 % SOLN Place 1 drop into both eyes 2 (two) times daily.    Yes [provider]  dorzolamide-timolol (COSOPT) 22.3-6.8 MG/ML ophthalmic  solution Place 1 drop into both eyes 2 (two) times daily.    Yes [provider]  dutasteride (AVODART) 0.5 MG capsule Take 1 capsule (0.5 mg total) by mouth daily. 07/08/15  Yes McGowan, Larene Beach A, PA-C  esomeprazole (NEXIUM) 20 MG capsule Take 20 mg by mouth daily at 12 noon.   Yes [provider]  ferrous sulfate 325 (65 FE) MG tablet Take 325 mg by mouth daily.   Yes [provider]  Naproxen Sodium (ALEVE) 220 MG CAPS Take 1 capsule by mouth as needed.   Yes [provider]  acetaminophen (TYLENOL) 500 MG tablet Take by mouth.    [provider]  Cyanocobalamin (B-12) 1000 MCG/ML KIT Inject 1 mL as directed every 30 (thirty) days.    [provider]  cyclobenzaprine (FLEXERIL) 10 MG tablet Take 1 tablet (10 mg total) by mouth 3 (three) times daily as needed for muscle spasms. 04/12/16   Earleen Newport, MD  furosemide (LASIX) 20 MG tablet Take 20 mg by mouth daily as needed for fluid. Try to limit to no more than 3-4 times per week. 11/23/14   [provider]  gabapentin (NEURONTIN) 100 MG capsule  04/01/15  [provider]  mupirocin ointment (BACTROBAN) 2 % Apply 1 application topically 3 (three) times daily. Patient not taking: Reported on 11/13/2016 05/05/16   [name], PA-C  oxyCODONE-acetaminophen (PERCOCET) 5-325 MG tablet Take 2 tablets by mouth every 6 (six) hours as needed for moderate pain or severe pain. 04/12/16   Earleen Newport, MD  rOPINIRole (REQUIP) 0.25 MG tablet Take 0.25 mg by mouth at bedtime as needed.  05/07/16 05/07/17  [provider]  tadalafil (CIALIS) 5 MG tablet Take 1 tablet (5 mg total) by mouth daily. Patient taking differently: Take 5 mg by mouth daily as needed.  07/08/15   Nori Riis, PA-C   Mr Foot Left Wo Contrast  Result Date: 11/13/2016 CLINICAL DATA:  Diabetic patient with left foot swelling and pain. Leg wound was dressed and patient was sent for imaging.  EXAM: MRI OF THE LEFT FOOT WITHOUT CONTRAST TECHNIQUE: Multiplanar, multisequence MR imaging of the left foot was performed. No intravenous contrast was administered. COMPARISON:  None. FINDINGS: The study was limited by motion artifacts on the axial T2 weighted fat saturated images. Bones/Joint/Cartilage No acute fracture nor bone destruction. No evidence for acute osteomyelitis. No joint dislocations. Ligaments Noncontributory Muscles and Tendons No pyomyositis. No muscle atrophy. Intact tendons crossing the foot. Soft tissues Soft tissue swelling about the medial and lateral aspect of the hindfoot and ankle more so laterally. No focal abscess. IMPRESSION: Soft tissue swelling about the ankle consistent with cellulitis. No abscess or evidence of acute osteomyelitis. Electronically Signed   By: [name] M.D.   On: 11/13/2016 21:27    Positive ROS: All other systems have been reviewed and were otherwise negative with the exception of those mentioned in the HPI and as above.  12 point ROS was performed.  Physical Exam: General: Alert and oriented.  No apparent distress.  Vascular:  Left foot:Dorsalis Pedis:  present Posterior Tibial:  present  Right foot: Dorsalis Pedis:  present Posterior Tibial:  present  Neuro:absent protective sensation but gross sensation intact.  Pain around open wound  Derm:Open ulcer probes deep in webspace with purulence and surrounding erythema.  Decrease erythema compared to yesterday  Ortho/MS: Edema left foot with hammertoe contractures   Assessment: Ulcer left foot with neuropathy  Plan: MRI - for osteo but pt with foul odor and continued drainage.  Will plan for OR for I & D today. Has been NPO. D/W pt and daughter and consent given.    [name], DPM Cell [phone]   11/14/2016 7:58 AM

## 2016-11-14 NOTE — Care Management Note (Signed)
Case Management Note  Patient Details  Name: Jeremy Herrera MRN: [id number] Date of Birth: 1940-11-25  Subjective/Objective:                  Met with patient and his wife to discuss discharge planning. He has a walker and a cane available for use at home if needed. He states he is typically independent at home.  Action/Plan:   Home health list provided. RNCM will follow.   Expected Discharge Date:                  Expected Discharge Plan:     In-House Referral:     Discharge planning Services  CM Consult  Post Acute Care Choice:  Home Health, Durable Medical Equipment Choice offered to:  Patient, Spouse  DME Arranged:    DME Agency:     HH Arranged:    [place] Agency:     Status of Service:  In process, will continue to follow  If discussed at Long Length of Stay Meetings, dates discussed:    Additional Comments:  Marshell Garfinkel, RN 11/14/2016, 10:26 AM

## 2016-11-14 NOTE — Anesthesia Post-op Follow-up Note (Signed)
Anesthesia QCDR form completed.        

## 2016-11-14 NOTE — Progress Notes (Signed)
Notified Dr Amado Coe of pt being confused for few minutes with difficulty speaking. VSS. While in the room speech back to clear, pt answered appropriately, but drowsy. Per Dr Amado Coe neuro checks every 2hrs x4.

## 2016-11-14 NOTE — Progress Notes (Signed)
Pt transferred to OR. Report given to Maurine Minister, RN- same day surgery.

## 2016-11-14 NOTE — Transfer of Care (Signed)
Immediate Anesthesia Transfer of Care Note  Patient: Jeremy Herrera  Procedure(s) Performed: Procedure(s): IRRIGATION AND DEBRIDEMENT FOOT (Left)  Patient Location: PACU  Anesthesia Type:General  Level of Consciousness: sedated  Airway & Oxygen Therapy: Patient Spontanous Breathing and Patient connected to face mask oxygen  Post-op Assessment: Report given to RN and Post -op Vital signs reviewed and stable  Post vital signs: Reviewed and stable  Last Vitals:  Vitals:   11/14/16 0528 11/14/16 1157  BP: 135/61 139/86  Pulse: (!) 46 87  Resp: 20 18  Temp: 36.7 C 36.4 C  SpO2: 99% 100%    Last Pain:  Vitals:   11/14/16 1157  TempSrc: Tympanic  PainSc:          Complications: No apparent anesthesia complications

## 2016-11-14 NOTE — Anesthesia Preprocedure Evaluation (Addendum)
Anesthesia Evaluation  Patient identified by MRN, date of birth, ID band Patient awake    Reviewed: Allergy & Precautions, NPO status , Patient's Chart, lab work & pertinent test results, reviewed documented beta blocker date and time   Airway Mallampati: II  TM Distance: >3 FB     Dental  (+) Chipped, Missing, Poor Dentition   Pulmonary           Cardiovascular hypertension, Pt. on medications +CHF       Neuro/Psych Anxiety    GI/Hepatic GERD  Controlled,  Endo/Other  diabetes  Renal/GU      Musculoskeletal   Abdominal   Peds  Hematology  (+) anemia ,   Anesthesia Other Findings CHF. Low platelets. No regurg. OK to proceed with LMA. No cardiac symptoms.  Reproductive/Obstetrics                           Anesthesia Physical Anesthesia Plan  ASA: III  Anesthesia Plan: General   Post-op Pain Management:    Induction: Intravenous  PONV Risk Score and Plan:   Airway Management Planned: LMA  Additional Equipment:   Intra-op Plan:   Post-operative Plan:   Informed Consent: I have reviewed the patients History and Physical, chart, labs and discussed the procedure including the risks, benefits and alternatives for the proposed anesthesia with the patient or authorized representative who has indicated his/her understanding and acceptance.     Plan Discussed with: CRNA  Anesthesia Plan Comments:         Anesthesia Quick Evaluation

## 2016-11-14 NOTE — Anesthesia Procedure Notes (Signed)
Procedure Name: LMA Insertion Date/Time: 11/14/2016 12:43 PM Performed by: Lily Kocher Pre-anesthesia Checklist: Patient identified, Patient being monitored, Timeout performed, Emergency Drugs available and Suction available Patient Re-evaluated:Patient Re-evaluated prior to induction Oxygen Delivery Method: Circle system utilized Preoxygenation: Pre-oxygenation with 100% oxygen Induction Type: IV induction Ventilation: Mask ventilation without difficulty LMA: LMA inserted LMA Size: 5.0 Tube type: Oral Number of attempts: 1 Placement Confirmation: positive ETCO2 and breath sounds checked- equal and bilateral Tube secured with: Tape Dental Injury: Teeth and Oropharynx as per pre-operative assessment

## 2016-11-14 NOTE — Progress Notes (Signed)
Notified Dr Amado Coe that pt was chocking on food, coughing. Pt has h/o disphagia.  Per MD to order NPO, ST consult.

## 2016-11-14 NOTE — Progress Notes (Signed)
[facility] -  at [facility]   PATIENT NAME: Jeremy Herrera    MR#:  [id number]  DATE OF BIRTH:  1940/11/18  SUBJECTIVE:  CHIEF COMPLAINT:  Patient is resting comfortably with some foot pain and reporting  decreased appetite No other complaints, family at bedside  REVIEW OF SYSTEMS:  CONSTITUTIONAL: No fever, fatigue or weakness.  EYES: No blurred or double vision.  EARS, NOSE, AND THROAT: No tinnitus or ear pain.  RESPIRATORY: No cough, shortness of breath, wheezing or hemoptysis.  CARDIOVASCULAR: No chest pain, orthopnea, edema.  GASTROINTESTINAL: No nausea, vomiting, diarrhea or abdominal pain.  GENITOURINARY: No dysuria, hematuria.  ENDOCRINE: No polyuria, nocturia,  HEMATOLOGY: No anemia, easy bruising or bleeding SKIN: No rash or lesion. MUSCULOSKELETAL: left foot pain and swelling   NEUROLOGIC: No tingling, numbness, weakness.  PSYCHIATRY: No anxiety or depression.   DRUG ALLERGIES:   Allergies  Allergen Reactions  . Hydrocodone-Acetaminophen Nausea And Vomiting    VITALS:  Blood pressure 139/86, pulse 87, temperature 97.6 F (36.4 C), temperature source Tympanic, resp. rate 18, height  (1.88 m), weight 94.8 kg (209 lb), SpO2 100 %.  PHYSICAL EXAMINATION:  GENERAL:  76 y.o.-year-old patient lying in the bed with no acute distress.  EYES: Pupils equal, round, reactive to light and accommodation. No scleral icterus. Extraocular muscles intact.  HEENT: Head atraumatic, normocephalic. Oropharynx and nasopharynx clear.  NECK:  Supple, no jugular venous distention. No thyroid enlargement, no tenderness.  LUNGS: Normal breath sounds bilaterally, no wheezing, rales,rhonchi or crepitation. No use of accessory muscles of respiration.  CARDIOVASCULAR: S1, S2 normal. No murmurs, rubs, or gallops.  ABDOMEN: Soft, nontender, nondistended. Bowel sounds present. No organomegaly or mass.  EXTREMITIES: left foot and clean dressing; capillary  refill is good No pedal edema, cyanosis, or clubbing.  NEUROLOGIC: Cranial nerves II through XII are intact. Muscle strength 5/5 in all extremities. Sensation intact. Gait not checked.  PSYCHIATRIC: The patient is alert and oriented x 3.  SKIN: No obvious rash, lesion, or ulcer.    LABORATORY PANEL:   CBC  Recent Labs Lab 11/13/16 1514  WBC 7.6  HGB 12.6*  HCT 35.3*  PLT 131*   ------------------------------------------------------------------------------------------------------------------  Chemistries   Recent Labs Lab 11/13/16 1514  NA 138  K 4.2  CL 106  CO2 25  GLUCOSE 108*  BUN 19  CREATININE 1.01  CALCIUM 8.8*  AST 26  ALT 29  ALKPHOS 152*  BILITOT 1.4*   ------------------------------------------------------------------------------------------------------------------  Cardiac Enzymes No results for input(s): TROPONINI in the last 168 hours. ------------------------------------------------------------------------------------------------------------------  RADIOLOGY:  Mr Foot Left Wo Contrast  Result Date: 11/13/2016 CLINICAL DATA:  Diabetic patient with left foot swelling and pain. Leg wound was dressed and patient was sent for imaging. EXAM: MRI OF THE LEFT FOOT WITHOUT CONTRAST TECHNIQUE: Multiplanar, multisequence MR imaging of the left foot was performed. No intravenous contrast was administered. COMPARISON:  None. FINDINGS: The study was limited by motion artifacts on the axial T2 weighted fat saturated images. Bones/Joint/Cartilage No acute fracture nor bone destruction. No evidence for acute osteomyelitis. No joint dislocations. Ligaments Noncontributory Muscles and Tendons No pyomyositis. No muscle atrophy. Intact tendons crossing the foot. Soft tissues Soft tissue swelling about the medial and lateral aspect of the hindfoot and ankle more so laterally. No focal abscess. IMPRESSION: Soft tissue swelling about the ankle consistent with cellulitis. No  abscess or evidence of acute osteomyelitis. Electronically Signed   By: Tollie Eth M.D.   On: 11/13/2016 21:27  EKG:   Orders placed or performed during the hospital encounter of 04/12/16  . ED EKG  . ED EKG    ASSESSMENT AND PLAN:   [name]  is a 76 y.o. male with a known history of diet-controlled diabetes mellitus, hyperlipidemia, hypertension,chronic atrial fibrillation on aspirin is presenting to the hospital as a direct admit from Dr. Irene Limbo office. Patient went to see Dr. Ether Griffins today for acute left leg pain and infection. The left leg wound was dressed and patient is sent over to the hospital as a direct admit   # Acute lt foot pain 2/2 lt foot infection MRI of the left foot with no osteomyelitis but cellulitis Empiric IV antibiotics Zosyn and vancomycin Consult placed to Dr. Ether Griffins podiatry,For IND today Pain management as needed  #Diabetes mellitus diet controlled Check hemoglobin A1c Sliding scale insulin  #Chronic atrial fibrillation rate controlled Patient is on aspirin which will be on hold for possible I&D tomorrow Sees Dr. Gwen Pounds as an outpatient  #essential hypertension Continue home medication Avodart    DVT prophylaxis with SCDs GI prophylaxis with Nexium     All the records are reviewed and case discussed with Care Management/Social Workerr. Management plans discussed with the patient, family and they are in agreement.  CODE STATUS: fc  TOTAL TIME TAKING CARE OF THIS PATIENT: 35  minutes.   POSSIBLE D/C IN 2 DAYS, DEPENDING ON CLINICAL CONDITION.  Note: This dictation was prepared with Dragon dictation along with smaller phrase technology. Any transcriptional errors that result from this process are unintentional.   Ramonita Lab M.D on 11/14/2016 at 1:18 PM  Between 7am to 6pm - Pager - [phone] After 6pm go to [email or web address] - password [email or web address] [facility]  [place] [facility]  Office  [phone]  CC: Primary care  physician; Marina Goodell, MD

## 2016-11-14 NOTE — Anesthesia Postprocedure Evaluation (Signed)
Anesthesia Post Note  Patient: Jeremy Herrera  Procedure(s) Performed: Procedure(s) (LRB): IRRIGATION AND DEBRIDEMENT FOOT (Left)  Patient location during evaluation: PACU Anesthesia Type: General Level of consciousness: awake and alert Pain management: pain level controlled Vital Signs Assessment: post-procedure vital signs reviewed and stable Respiratory status: spontaneous breathing, nonlabored ventilation, respiratory function stable and patient connected to nasal cannula oxygen Cardiovascular status: blood pressure returned to baseline and stable Postop Assessment: no apparent nausea or vomiting Anesthetic complications: no     Last Vitals:  Vitals:   11/14/16 1441 11/14/16 1500  BP: (!) 141/78 129/78  Pulse: 68 70  Resp: (!) 22 16  Temp: 36.8 C 36.5 C  SpO2: 100% 98%    Last Pain:  Vitals:   11/14/16 1500  TempSrc: Oral  PainSc: 0-No pain                 Ion Gonnella S

## 2016-11-15 LAB — BASIC METABOLIC PANEL
Anion gap: 8 (ref 5–15)
BUN: 19 mg/dL (ref 6–20)
CO2: 25 mmol/L (ref 22–32)
Calcium: 8.8 mg/dL — ABNORMAL LOW (ref 8.9–10.3)
Chloride: 107 mmol/L (ref 101–111)
Creatinine, Ser: 1.17 mg/dL (ref 0.61–1.24)
GFR calc Af Amer: >60 mL/min (ref >60)
GFR calc non Af Amer: 58 mL/min — ABNORMAL LOW (ref >60)
Glucose, Bld: 109 mg/dL — ABNORMAL HIGH (ref 65–99)
Potassium: 4.8 mmol/L (ref 3.5–5.1)
Sodium: 140 mmol/L (ref 135–145)

## 2016-11-15 LAB — GLUCOSE, CAPILLARY
GLUCOSE-CAPILLARY: 96 mg/dL (ref 65–99)
Glucose-Capillary: 109 mg/dL — ABNORMAL HIGH (ref 65–99)
Glucose-Capillary: 96 mg/dL (ref 65–99)
Glucose-Capillary: 113 mg/dL — ABNORMAL HIGH (ref 65–99)

## 2016-11-15 LAB — CBC
HCT: 38.7 % — ABNORMAL LOW (ref 40.0–52.0)
Hemoglobin: 13.5 g/dL (ref 13.0–18.0)
MCH: 31.6 pg (ref 26.0–34.0)
MCHC: 34.9 g/dL (ref 32.0–36.0)
MCV: 90.5 fL (ref 80.0–100.0)
Platelets: 170 10*3/uL (ref 150–440)
RBC: 4.28 MIL/uL — ABNORMAL LOW (ref 4.40–5.90)
RDW: 13.1 % (ref 11.5–14.5)
WBC: 8.8 10*3/uL (ref 3.8–10.6)

## 2016-11-15 LAB — VANCOMYCIN, TROUGH: Vancomycin Tr: 15 ug/mL (ref 15–20)

## 2016-11-15 MED ORDER — ENOXAPARIN SODIUM 40 MG/0.4ML ~~LOC~~ SOLN
40.0000 mg | SUBCUTANEOUS | Status: DC
  Administered 2016-11-15 – 2016-11-16 (×2): 40 mg via SUBCUTANEOUS
  Filled 2016-11-15 (×2): qty 0.4

## 2016-11-15 NOTE — Progress Notes (Signed)
[facility] - [place] at [facility]   PATIENT NAME: Jeremy Herrera    MR#:  [id number]  DATE OF BIRTH:  August 02, 1940  SUBJECTIVE:  CHIEF COMPLAINT:  Patient is resting comfortably with some foot pain and evaluated by speech therapy Granddaughter at bedside  REVIEW OF SYSTEMS:  CONSTITUTIONAL: No fever, fatigue or weakness.  EYES: No blurred or double vision.  EARS, NOSE, AND THROAT: No tinnitus or ear pain.  RESPIRATORY: No cough, shortness of breath, wheezing or hemoptysis.  CARDIOVASCULAR: No chest pain, orthopnea, edema.  GASTROINTESTINAL: No nausea, vomiting, diarrhea or abdominal pain.  GENITOURINARY: No dysuria, hematuria.  ENDOCRINE: No polyuria, nocturia,  HEMATOLOGY: No anemia, easy bruising or bleeding SKIN: No rash or lesion. MUSCULOSKELETAL: left foot pain and swelling   NEUROLOGIC: No tingling, numbness, weakness.  PSYCHIATRY: No anxiety or depression.   DRUG ALLERGIES:   Allergies  Allergen Reactions  . Hydrocodone-Acetaminophen Nausea And Vomiting    VITALS:  Blood pressure (!) 122/92, pulse 66, temperature (!) 97.5 F (36.4 C), temperature source Oral, resp. rate 18, height  (1.88 m), weight 94.8 kg (209 lb), SpO2 99 %.  PHYSICAL EXAMINATION:  GENERAL:  76 y.o.-year-old patient lying in the bed with no acute distress.  EYES: Pupils equal, round, reactive to light and accommodation. No scleral icterus. Extraocular muscles intact.  HEENT: Head atraumatic, normocephalic. Oropharynx and nasopharynx clear.  NECK:  Supple, no jugular venous distention. No thyroid enlargement, no tenderness.  LUNGS: Normal breath sounds bilaterally, no wheezing, rales,rhonchi or crepitation. No use of accessory muscles of respiration.  CARDIOVASCULAR: S1, S2 normal. No murmurs, rubs, or gallops.  ABDOMEN: Soft, nontender, nondistended. Bowel sounds present. No organomegaly or mass.  EXTREMITIES: left foot and clean dressing; capillary refill is good No  pedal edema, cyanosis, or clubbing.  NEUROLOGIC: Cranial nerves II through XII are intact. Muscle strength 5/5 in all extremities. Sensation intact. Gait not checked.  PSYCHIATRIC: The patient is alert and oriented x 3.  SKIN: No obvious rash, lesion, or ulcer.    LABORATORY PANEL:   CBC  Recent Labs Lab 11/15/16 0918  WBC 8.8  HGB 13.5  HCT 38.7*  PLT 170   ------------------------------------------------------------------------------------------------------------------  Chemistries   Recent Labs Lab 11/13/16 1514 11/15/16 0918  NA 138 140  K 4.2 4.8  CL 106 107  CO2 25 25  GLUCOSE 108* 109*  BUN 19 19  CREATININE 1.01 1.17  CALCIUM 8.8* 8.8*  AST 26  --   ALT 29  --   ALKPHOS 152*  --   BILITOT 1.4*  --    ------------------------------------------------------------------------------------------------------------------  Cardiac Enzymes No results for input(s): TROPONINI in the last 168 hours. ------------------------------------------------------------------------------------------------------------------  RADIOLOGY:  Mr Foot Left Wo Contrast  Result Date: 11/13/2016 CLINICAL DATA:  Diabetic patient with left foot swelling and pain. Leg wound was dressed and patient was sent for imaging. EXAM: MRI OF THE LEFT FOOT WITHOUT CONTRAST TECHNIQUE: Multiplanar, multisequence MR imaging of the left foot was performed. No intravenous contrast was administered. COMPARISON:  None. FINDINGS: The study was limited by motion artifacts on the axial T2 weighted fat saturated images. Bones/Joint/Cartilage No acute fracture nor bone destruction. No evidence for acute osteomyelitis. No joint dislocations. Ligaments Noncontributory Muscles and Tendons No pyomyositis. No muscle atrophy. Intact tendons crossing the foot. Soft tissues Soft tissue swelling about the medial and lateral aspect of the hindfoot and ankle more so laterally. No focal abscess. IMPRESSION: Soft tissue swelling  about the ankle consistent with cellulitis.  No abscess or evidence of acute osteomyelitis. Electronically Signed   By: Tollie Eth M.D.   On: 11/13/2016 21:27    EKG:   Orders placed or performed during the hospital encounter of 04/12/16  . ED EKG  . ED EKG    ASSESSMENT AND PLAN:   Jeremy Herrera  is a 76 y.o. male with a known history of diet-controlled diabetes mellitus, hyperlipidemia, hypertension,chronic atrial fibrillation on aspirin is presenting to the hospital as a direct admit from Dr. Irene Limbo office. Patient went to see Dr. Ether Griffins today for acute left leg pain and infection. The left leg wound was dressed and patient is sent over to the hospital as a direct admit   # Acute lt foot pain 2/2 lt foot infection MRI of the left foot with no osteomyelitis but cellulitis status post incision and drainage 11/14/2016 Empiric IV antibiotics Zosyn and vancomycin,current discharge patient with by mouth Augmentin and ciprofloxacin Follow-up culture sensitivity Dr. Ether Griffins podiatry recommending PT evaluation and home health for dressing changes Pain management as needed Outpatient follow-up with podiatry in 1 week  #Diabetes mellitus diet controlled  hemoglobin A1c 5.3 Sliding scale insulin  #Chronic atrial fibrillation rate controlled Patient is on aspirin which will be on hold for possible I&D tomorrow Sees Dr. Gwen Pounds as an outpatient  #essential hypertension Continue home medication Avodart  PT consult placed  DVT prophylaxis with SCDs GI prophylaxis with Nexium     All the records are reviewed and case discussed with Care Management/Social Workerr. Management plans discussed with the patient, family and they are in agreement.  CODE STATUS: fc  TOTAL TIME TAKING CARE OF THIS PATIENT: 35  minutes.   POSSIBLE D/C IN am DAYS, DEPENDING ON CLINICAL CONDITION.  Note: This dictation was prepared with Dragon dictation along with smaller phrase technology. Any  transcriptional errors that result from this process are unintentional.   Ramonita Lab M.D on 11/15/2016 at 2:59 PM  Between 7am to 6pm - Pager - [phone] After 6pm go to [email or web address] - password [email or web address] [facility]  [place] [facility]  Office  [phone]  CC: Primary care physician; Marina Goodell, MD

## 2016-11-15 NOTE — Progress Notes (Signed)
Pharmacy Antibiotic Note  Jeremy Herrera is a 76 y.o. male admitted on 11/13/2016 with wound infection.  Pharmacy has been consulted for Zosyn and vancomycin dosing.  Plan: 1. Zosyn 3.375 gm IV Q8H EI 2. Vancomycin 1 gm IV x 1 followed in approximately 6 hours (stacked dosing) by vancomycin 1 gm IV Q12H, predicted trough 16 mcg/mL. Pharmacy will continue to follow and adjust as needed to maintain trough 15 to 20 mcg/mL.   Vd 57.6 L, Ke 0.064 hr-1, T1/2 10.9 hr  Height:  (188 cm) Weight: 209 lb (94.8 kg) IBW/kg (Calculated) : 82.2  Temp (24hrs), Avg:97.8 F (36.6 C), Min:97.6 F (36.4 C), Max:98.3 F (36.8 C)   Recent Labs Lab 11/13/16 1514 11/15/16 0918  WBC 7.6 8.8  CREATININE 1.01 1.17  VANCOTROUGH  --  15    Estimated Creatinine Clearance: 61.5 mL/min (by C-G formula based on SCr of 1.17 mg/dL).    Allergies  Allergen Reactions  . Hydrocodone-Acetaminophen Nausea And Vomiting   Thank you for allowing pharmacy to be a part of this patient's care.   9/26@1100 ; vancomycin trough 15. Therapeutic vancomycin and appropriate zosyn dosing, no change at this time. Will continue to monitor renal fx and duration of therapy.     Loic Hobin, Pharm.D., BCPS Clinical Pharmacist 11/15/2016 10:57 AM

## 2016-11-15 NOTE — Progress Notes (Signed)
Daily Progress Note   Subjective  - 1 Day Post-Op  F/u left foot I & D.  Doing well  Objective Vitals:   11/15/16 0425 11/15/16 0838 11/15/16 1245 11/15/16 1325  BP: 134/65 (!) 146/82 131/62 (!) 122/92  Pulse: 61 77 73 66  Resp: Temp: 97.8 F (36.6 C) 97.6 F (36.4 C) (!) 97.5 F (36.4 C) (!) 97.5 F (36.4 C)  TempSrc: Oral Oral Oral Oral  SpO2: 97% 95% 93% 99%  Weight:      Height:        Physical Exam: Foot erythema and edema markedly improved.  No purulence today.  No foul odor.  Laboratory CBC    Component Value Date/Time   WBC 8.8 11/15/2016 0918   HGB 13.5 11/15/2016 0918   HGB 9.0 (L) 04/01/2013 0728   HCT 38.7 (L) 11/15/2016 0918   HCT 25.8 (L) 04/01/2013 0728   PLT 170 11/15/2016 0918   PLT 188 04/01/2013 0728    BMET    Component Value Date/Time   NA 140 11/15/2016 0918   NA 139 04/01/2013 0728   K 4.8 11/15/2016 0918   K 4.1 04/01/2013 0728   CL 107 11/15/2016 0918   CL 109 (H) 04/01/2013 0728   CO2 25 11/15/2016 0918   CO2 27 04/01/2013 0728   GLUCOSE 109 (H) 11/15/2016 0918   GLUCOSE 98 04/01/2013 0728   BUN 19 11/15/2016 0918   BUN 13 04/01/2013 0728   CREATININE 1.17 11/15/2016 0918   CREATININE 1.07 04/01/2013 0728   CALCIUM 8.8 (L) 11/15/2016 0918   CALCIUM 8.4 (L) 04/01/2013 0728   GFRNONAA 58 (L) 11/15/2016 0918   GFRNONAA >60 04/01/2013 0728   GFRAA >60 11/15/2016 0918   GFRAA >60 04/01/2013 0728   Results for orders placed or performed during the hospital encounter of 11/13/16  Aerobic/Anaerobic Culture (surgical/deep wound)     Status: None (Preliminary result)   Collection Time: 11/14/16  8:31 AM  Result Value Ref Range Status   Specimen Description FOOT LEFT FOOT  Final   Special Requests Normal  Final   Gram Stain   Final    RARE WBC PRESENT, PREDOMINANTLY PMN MODERATE GRAM NEGATIVE RODS MODERATE GRAM POSITIVE COCCI    Culture   Final    CULTURE REINCUBATED FOR BETTER GROWTH Performed at [facility], [address] [place], [place] [address]    Report Status PENDING  Incomplete  Surgical PCR screen     Status: None   Collection Time: 11/14/16  8:38 AM  Result Value Ref Range Status   MRSA, PCR NEGATIVE NEGATIVE Final   Staphylococcus aureus NEGATIVE NEGATIVE Final    Comment: (NOTE) The Xpert SA Assay (FDA approved for NASAL specimens in patients 56 years of age and older), is one component of a comprehensive surveillance program. It is not intended to diagnose infection nor to guide or monitor treatment.   Aerobic/Anaerobic Culture (surgical/deep wound)     Status: None (Preliminary result)   Collection Time: 11/14/16  1:14 PM  Result Value Ref Range Status   Specimen Description WOUND  Final   Special Requests FOOT  Final   Gram Stain   Final    FEW WBC PRESENT,BOTH PMN AND MONONUCLEAR NO ORGANISMS SEEN    Culture   Final    NO GROWTH < 24 HOURS Performed at [facility], [address], [place], [place] [address]    Report Status PENDING  Incomplete    Assessment/Planning: Abscess with ulcer left foot.  S/P I & D.   Wound is markedly improved.  Will have PT evaluate for NWB to left foot.  If not safe for NWB can WB to heel but ideally no weight to foot.  Will need home health for dressing changes.  Dressing changes:  Flush wound with saline.  Pack with iodoform packing strip and apply bulky sterile dressing daily.    Recommend po abx with augmentin and cipro.  Can narrow in outpt clinic once sensitivities have come back.  OK for d/c from podiatry stand point.  F/U with me in 1 week.  Pt prefers [place].  Gwyneth Revels A  11/15/2016, 2:23 PM

## 2016-11-15 NOTE — Consult Note (Signed)
Clinical/Bedside Swallow Evaluation Patient Details  Name: Jeremy Herrera MRN: [id number] Date of Birth: Feb 24, 1940  Today's Date: 11/15/2016 Time: SLP Start Time (ACUTE ONLY): 4098 SLP Stop Time (ACUTE ONLY): 0955 SLP Time Calculation (min) (ACUTE ONLY): 30 min  Past Medical History:  Past Medical History:  Diagnosis Date  . Anemia   . BPH (benign prostatic hyperplasia)   . CHF (congestive heart failure) (HCC)   . Dysphagia   . GERD (gastroesophageal reflux disease)   . HLD (hyperlipidemia)   . HTN (hypertension)   . Irregular heart beat   . Nocturia   . Urinary retention    Past Surgical History:  Past Surgical History:  Procedure Laterality Date  . bladder patching  1964  . HIP SURGERY Right 2014  . IRRIGATION AND DEBRIDEMENT FOOT Left 11/14/2016   Procedure: IRRIGATION AND DEBRIDEMENT FOOT;  Surgeon: Gwyneth Revels, DPM;  Location: [facility] ORS;  Service: Podiatry;  Laterality: Left;  . TRANSURETHRAL RESECTION OF PROSTATE     HPI:  Jeremy Herrera  is a 76 y.o. male with a known history of diet-controlled diabetes mellitus, hyperlipidemia, hypertension, chronic atrial fibrillation on aspirin is presenting to the hospital as a direct admit from Dr. Irene Limbo office for acute left leg pain and infection. Pt seen this date s/p I&D plantar left foot ulcer with abscess 9/25.  Pt was reported to have been observed choking/coughing on food yesterday evening during dinner. Pt NPO until completion of bedside swallow evaluation. Pt with known history of chronic dysphagia.  MBS completed 03/16/15 (+) moderate oropharyngeal dysphagia with notable moderate pharyngeal residue throughout the pharynx.  A dry swallow did not complete clear residue.  With small boluses of thin liquid there is laryngeal penetration without aspiration.  There was one episode of frank aspiration of thin liquid, occurring before the swallow due to pre-swallow escape of a portion before the bulk of the bolus reached the base  of the tongue.  The patient cleared his throat, but did not cough.  This study was consistent with neuromuscular impairment as well as the effects of laryngopharyngeal reflux (inflammation, edema, and resultant decreased sensation of the larynx and pharynx).  Pt has reported hx of reflux for which he takes Nexium.  He also reports that he will throat clear consistently throughout the day and has episodes of choking/coughing on a daily basis at baseline.  Pt is a very good historian and provided several details of dysphagia hx as well as utilized swallow strategies.  No recent hx of PNA.  Pt is ambulatory and reports thorough oral care.   Assessment / Plan / Recommendation Clinical Impression  Jeremy Herrera  is a 76 y.o. male seen at bedside for clinical swallow evaluation.  Pt with hx of chronic dysphagia and GERD.  Pt reported he underwent esophageal dilation ~10 years ago which reported alleviated several of his dysphagia symptoms.  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  See HPI for details.  OME was [facility].  Limited upper dentition noted.  Pt noted to present with persistent throat clearing prior to PO trials - and was unchanged with PO.  He reported he experiences "choking" incidents about x1/day at baseline.  He utilizes swallow strategies including small bites and sips, thorough mastication, subsequent swallows, and liquid washes to assist.  The patient and his daughter in-law denies any acute changes in swallow function -  dysphagia is at baseline.  No other overt s/sx of aspiration observed at bedside.  Positive contributing factors include no recent hx of PNA, reports thorough oral care, and pt is ambulatory and somewhat active at baseline (still occasionally mows lawn, etc.).  Recommend dysphagia 3 diet with unrestricted  liquids and adherence to aspiration precautions and compensatory swallowing strategies. SLP will follow during acute stay for diet tolerance check and re-education and teach back of swallowing strategies + reflux precautions.  Pt's bedside presentation appears to be suggestive of esophageal dysfunction/laryngopharyngeal reflux and would benefit from ENT consult at time of discharge to evaluate such.  SLP Visit Diagnosis: Dysphagia, oropharyngeal phase (R13.12)    Aspiration Risk  Mild aspiration risk    Diet Recommendation Dysphagia 3 (Mech soft);Thin liquid   Liquid Administration via: Cup;Straw Medication Administration: Whole meds with puree Supervision: Patient able to self feed Compensations: Small sips/bites;Multiple dry swallows after each bite/sip;Follow solids with liquid Postural Changes: Seated upright at 90 degrees;Remain upright for at least 30 minutes after po intake    Other  Recommendations Recommended Consults: Consider ENT evaluation;Consider esophageal assessment Oral Care Recommendations: Oral care BID   Follow up Recommendations None      Frequency and Duration min 1 x/week  2 weeks       Prognosis Prognosis for Safe Diet Advancement: Good Barriers/Prognosis Comment: excellent (+) good insight      Swallow Study   General Date of Onset: 11/15/16 HPI: Jeremy Herrera  is a 76 y.o. male with a known history of diet-controlled diabetes mellitus, hyperlipidemia, hypertension, chronic atrial fibrillation on aspirin is presenting to the hospital as a direct admit from Dr. Irene Limbo office for acute left leg pain and infection. Pt seen this date s/p I&D plantar left foot ulcer with abscess 9/25.  Pt was reported to have been observed choking/coughing on food yesterday evening during dinner. Pt NPO until completion of bedside swallow evaluation. Pt with known history of chronic dysphagia.  MBS completed 03/16/15 (+) moderate oropharyngeal dysphagia with notable moderate  pharyngeal residue throughout the pharynx.  A dry swallow did not complete clear residue.  With small boluses of thin liquid there is laryngeal penetration without aspiration.  There was one episode of frank aspiration of thin liquid, occurring before the swallow due to pre-swallow escape of a portion before the bulk of the bolus reached the base of the tongue.  The patient cleared his throat, but did not cough.  This study was consistent with neuromuscular impairment as well as the effects of laryngopharyngeal reflux (inflammation, edema, and resultant decreased sensation of the larynx and pharynx).  Pt has reported hx of reflux for which he takes Nexium.  He also reports that he will throat clear consistently throughout the day and has episodes of choking/coughing on a daily basis at baseline.  Pt is a very good historian and provided several details of dysphagia hx as well as utilized swallow strategies.  No recent hx of PNA.  Pt is ambulatory and reports thorough oral care. Type of Study: Bedside Swallow Evaluation Diet Prior to this Study: Dysphagia 3 (soft) Temperature Spikes Noted: N/A Respiratory Status: Room air History of Recent Intubation: No Behavior/Cognition: Alert;Cooperative;Pleasant mood Oral Cavity Assessment: Within Functional Limits Oral Care Completed by SLP: No Oral Cavity - Dentition: Missing dentition Vision: Functional for self-feeding Self-Feeding Abilities: Able to feed self Patient Positioning: Upright in bed Baseline Vocal Quality: Normal Volitional Cough: Other (Comment) (No coughing observed, however several throat clears prior to and during  PO trials) Volitional Swallow: Able to elicit    Oral/Motor/Sensory Function Overall Oral Motor/Sensory Function: Within functional limits   Ice Chips Ice chips: Not tested   Thin Liquid Thin Liquid: Within functional limits Presentation: Straw Other Comments: PO trials of thin liquid x5 completed via straw.  Pt (+) consistent  periodic throat clears both prior to and with all PO trials regardless of consistency.  Throat clearing appears to be related to reflux? vs. residue?    Nectar Thick Nectar Thick Liquid: Not tested   Honey Thick Honey Thick Liquid: Not tested   Puree Puree: Within functional limits Presentation: Self Fed Other Comments: PO trials of applesauce x2 completed. Pt (+) consistent periodic throat clears both prior to and with all PO trials regardless of consistency.  Throat clearing appears to be related to reflux? vs. residue?   Solid   GO   Solid: Within functional limits Presentation: Self Fed Other Comments: Small bite of graham cracker x2 completed. Pt (+) consistent periodic throat clears both prior to and with all PO trials regardless of consistency.  Throat clearing appears to be related to reflux? vs. residue?        Jeremy Herrera 11/15/2016,11:18 AM

## 2016-11-16 LAB — CBC
HCT: 38.1 % — ABNORMAL LOW (ref 40.0–52.0)
Hemoglobin: 13.2 g/dL (ref 13.0–18.0)
MCH: 31.3 pg (ref 26.0–34.0)
MCHC: 34.7 g/dL (ref 32.0–36.0)
MCV: 90.1 fL (ref 80.0–100.0)
PLATELETS: 171 10*3/uL (ref 150–440)
RBC: 4.23 MIL/uL — ABNORMAL LOW (ref 4.40–5.90)
RDW: 13 % (ref 11.5–14.5)
WBC: 6 10*3/uL (ref 3.8–10.6)

## 2016-11-16 LAB — GLUCOSE, CAPILLARY
GLUCOSE-CAPILLARY: 87 mg/dL (ref 65–99)
GLUCOSE-CAPILLARY: 102 mg/dL — AB (ref 65–99)
GLUCOSE-CAPILLARY: 98 mg/dL (ref 65–99)
Glucose-Capillary: 139 mg/dL — ABNORMAL HIGH (ref 65–99)

## 2016-11-16 MED ORDER — CIPROFLOXACIN HCL 500 MG PO TABS
500.0000 mg | ORAL_TABLET | Freq: Two times a day (BID) | ORAL | Status: DC
  Administered 2016-11-16 – 2016-11-17 (×2): 500 mg via ORAL
  Filled 2016-11-16 (×2): qty 1

## 2016-11-16 MED ORDER — AMOXICILLIN-POT CLAVULANATE 875-125 MG PO TABS
1.0000 | ORAL_TABLET | Freq: Two times a day (BID) | ORAL | Status: DC
  Administered 2016-11-16 – 2016-11-17 (×3): 1 via ORAL
  Filled 2016-11-16 (×3): qty 1

## 2016-11-16 NOTE — Clinical Social Work Note (Addendum)
Clinical Social Work Assessment  Patient Details  Name: Jeremy Herrera MRN: [id number] Date of Birth: Jun 21, 1940  Date of referral:  11/16/16               Reason for consult:  Facility Placement                Permission sought to share information with:  Chartered certified accountant granted to share information::  Yes, Verbal Permission Granted  Name::    [name]::   [name]  Relationship::     Contact Information:     Housing/Transportation Living arrangements for the past 2 months:  [place] of Information:  Patient Patient Interpreter Needed:  None Criminal Activity/Legal Involvement Pertinent to Current Situation/Hospitalization:  No - Comment as needed Significant Relationships:  Adult Children Lives with:  Self Do you feel safe going back to the place where you live?  Yes Need for family participation in patient care:  Yes (Comment)  Care giving concerns: Patient lives alone in [place], [place].   Social Worker assessment / plan: Social work Solicitor ([place]) received a verbal consult from PT that they are recommending SNF for patient. Social work Theatre manager met patient by his bedside to discuss recommendation. Patient was alert, oriented x4 and by himself. Patient was very pleasant. Social Work Theatre manager introduced herself and explained role of the [place]. Patient lives by himself but has a walker and ramp to assist with mobility. Patient explained that he has been to [facility] for short term rehab before and would prefer to go back to [facility] because it is closer to his house. Patient also shared that his daughter Jeremy Herrera [phone] is his HPOA. Social work Theatre manager explained that we will have to get authorization from [facility] before patient can go to SNF. Patient verbalized his understanding and is agreeable to SNF search in [place]. CSW and Social work Theatre manager were able  to get in contact with [place] and inform her of above. FL2 completed and faxed out.  Social work Theatre manager presented bed offers to patient and he chose [facility]. [facility] admissions coordinator at [facility] is aware of accepted bed offer. CSW started [facility] authorization and faxed in clinicals to [facility]. Patient's daughter Jeremy Herrera is aware of above. CSW will continue to follow and assist as needed.   Employment status:  Retired Nurse, adult PT Recommendations:  [place] / Referral to community resources:  [place]  Patient/Family's Response to care:  Patient is agreeable to going to [facility].  Patient/Family's Understanding of and Emotional Response to Diagnosis, Current Treatment, and Prognosis:  Patient and is daughter were very pleasant and thanked CSW for assistance.   Emotional Assessment Appearance:  Appears stated age Attitude/Demeanor/Rapport:    Affect (typically observed):  Sad, Pleasant, Accepting, Adaptable Orientation:  Oriented to Self, Oriented to Place, Oriented to  Time, Oriented to Situation Alcohol / Substance use:  Not Applicable Psych involvement (Current and /or in the community):  No (Comment)  Discharge Needs  Concerns to be addressed:  Discharge Planning Concerns Readmission within the last 30 days:  No Current discharge risk:  Dependent with Mobility Barriers to Discharge:  Continued Medical Work up   Smith Mince, Student-Social Work 11/16/2016, 1:43 PM

## 2016-11-16 NOTE — Evaluation (Signed)
Physical Therapy Evaluation Patient Details Name: Jeremy Herrera MRN: [id number] DOB: March 21, 1940 Today's Date: 11/16/2016   History of Present Illness   76 y.o. male with a known history of diet-controlled diabetes mellitus, hyperlipidemia, hypertension,chronic atrial fibrillation.  Had I&D of L foot abscess 9/25.  Clinical Impression  Pt did well with bed mobility, but was generally struggled with fatigue very quickly with mobility and struggled to keep weight off L LE consistently/confidently.  Pt is able to ambulate just over 10 ft and was very tired and generally did not feel great about how he did.  Pt very much wanting to go home, but considering safety he realizes that STR is a better move.    Follow Up Recommendations SNF    Equipment Recommendations       Recommendations for Other Services       Precautions / Restrictions Precautions Precautions: Fall Restrictions Weight Bearing Restrictions: Yes LLE Weight Bearing: Non weight bearing      Mobility  Bed Mobility Overal bed mobility: Independent             General bed mobility comments: Pt was able to get himself up to sitting EOB w/o assist  Transfers Overall transfer level: Needs assistance Equipment used: Rolling walker (2 wheeled) Transfers: Sit to/from Stand Sit to Stand: Min assist         General transfer comment: Pt struggled to get to standing from low bed and needed assist to keep weight coming forward while rising.  Had difficulty with keeping weight off L LE during transition  Ambulation/Gait Ambulation/Gait assistance: Min assist Ambulation Distance (Feet): 12 Feet Assistive device: Rolling walker (2 wheeled)       General Gait Details: Pt with poor confidence and tolerance with ambulation.  He had muliple steps where L heel or toe did brush the groud.  Pt highly reliant on the walker and very fatigued with minimal effort secondary to heavy UE use.  Stairs            Wheelchair  Mobility    Modified Rankin (Stroke Patients Only)       Balance Overall balance assessment: Needs assistance   Sitting balance-Leahy Scale: Fair       Standing balance-Leahy Scale: Poor Standing balance comment: Heavy UE use on rails, able to statically keep weight off L LE, but was highly reliant on UEs to maintain dynamic activity.                             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     Hand Dominance        Extremity/Trunk Assessment   Upper Extremity Assessment Upper Extremity Assessment: Overall WFL for tasks assessed    Lower Extremity Assessment Lower Extremity Assessment: Overall WFL for tasks assessed (deferred much L lower leg/foot testing)  Communication   Communication: No difficulties  Cognition Arousal/Alertness: Awake/alert Behavior During Therapy: WFL for tasks assessed/performed Overall Cognitive Status: Within Functional Limits for tasks assessed                                        General Comments      Exercises     Assessment/Plan    PT Assessment Patient needs continued PT services  PT Problem List Decreased strength;Decreased range of motion;Decreased balance;Decreased activity tolerance;Decreased mobility;Decreased knowledge of use of DME;Decreased safety awareness;Pain;Cardiopulmonary status limiting activity       PT Treatment Interventions DME  instruction;Gait training;Functional mobility training;Therapeutic activities;Therapeutic exercise    PT Goals (Current goals can be found in the Care Plan section)  Acute Rehab PT Goals Patient Stated Goal: go home PT Goal Formulation: With patient Time For Goal Achievement: 11/30/16 Potential to Achieve Goals: Fair    Frequency Min 2X/week   Barriers to discharge        Co-evaluation               AM-PAC PT "6 Clicks" Daily Activity  Outcome Measure Difficulty turning over in bed (including adjusting bedclothes, sheets and blankets)?: A Little Difficulty moving from lying on back to sitting on the side of the bed? : A Little Difficulty sitting down on and standing up from a chair with arms (e.g., wheelchair, bedside commode, etc,.)?: Unable Help needed moving to and from a bed to chair (including a wheelchair)?: A Little Help needed walking in hospital room?: A Lot Help needed climbing 3-5 steps with a railing? : Total 6 Click Score: 13    End of Session Equipment Utilized During Treatment: Gait belt Activity Tolerance: Patient limited by fatigue Patient left: with chair alarm set;with call bell/phone within reach Nurse Communication: Mobility status PT Visit Diagnosis: Muscle weakness (generalized) (M62.81);Difficulty in walking, not elsewhere classified (R26.2)    Time: 1136-1209 PT Time Calculation (min) (ACUTE ONLY): 33 min   Charges:   PT Evaluation $PT Eval Low Complexity: 1 Low PT Treatments $Gait Training: 8-22 mins   PT G Codes:   PT G-Codes **NOT FOR INPATIENT CLASS** Functional Assessment Tool Used: AM-PAC 6 Clicks Basic Mobility Functional Limitation: Mobility: Walking and moving around Mobility: Walking and Moving Around Current Status ([id number]): At least 40 percent but less than 60 percent impaired, limited or restricted Mobility: Walking and Moving Around Goal Status [phone]): At least 20 percent but less than 40 percent impaired, limited or  restricted    Malachi Pro, DPT 11/16/2016, 1:47 PM

## 2016-11-16 NOTE — [place] FL2 (Signed)
[place] MEDICAID FL2 LEVEL OF CARE SCREENING TOOL     IDENTIFICATION  Patient Name: Jeremy Herrera Birthdate: 1940-08-09 Sex: male Admission Date (Current Location): 11/13/2016  [place] and [place] Number:  Chiropodist and Address:  [facility], [address] [place], [place] [address]      Provider Number: [id number]  Attending Physician Name and Address:  Ramonita Lab, MD  Relative Name and Phone Number:       Current Level of Care: Hospital Recommended Level of Care: Skilled Nursing Facility Prior Approval Number:    Date Approved/Denied:   PASRR Number:   [id number]  Discharge Plan: SNF    Current Diagnoses: Patient Active Problem List   Diagnosis Date Noted  . Diabetic foot ulcer (HCC) 11/13/2016  . Nocturia 10/17/2014  . History of urinary retention 10/17/2014  . 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  . B12 deficiency 09/30/2014  . Right leg swelling 09/30/2014  . Anemia 09/16/2014  . Thrombocytopenia (HCC) 09/16/2014  . Benign essential HTN 06/08/2014  . 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 Beat, premature ventricular 12/03/2013  . Heart valve disease 12/03/2013  . 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 Diabetes mellitus, type 2 (HCC) 11/20/2013  . Acid reflux 11/20/2013  . Anemia, iron deficiency 11/20/2013  . Abnormal presence of protein in urine 11/20/2013  . 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    Orientation RESPIRATION BLADDER Height & Weight     Self, Time, Situation, Place  Normal Continent Weight: 209 lb (94.8 kg) Height:   (188 cm)  BEHAVIORAL SYMPTOMS/MOOD NEUROLOGICAL BOWEL NUTRITION STATUS      Continent Diet: dysphagia 3 diet with Thin liquids   AMBULATORY STATUS COMMUNICATION OF NEEDS Skin   Extensive Assist Verbally Surgical wounds (Left foot; Closed)                       Personal Care  Assistance Level of Assistance  Bathing, Feeding, Dressing Bathing Assistance: Limited assistance Feeding assistance: Independent Dressing Assistance: Limited assistance     Functional Limitations Info  Sight, Hearing, Speech Sight Info: Adequate Hearing Info: Adequate Speech Info: Adequate    SPECIAL CARE FACTORS FREQUENCY  PT (By licensed PT), OT (By licensed OT)     PT Frequency:  (5) OT Frequency:  (5)            Contractures      Additional Factors Info  Code Status, Allergies Code Status Info:  (Full Code) Allergies Info:  (HYDROCODONE-ACETAMINOPHEN )           Current Medications (11/16/2016):  This is the current hospital active medication list Current Facility-Administered Medications  Medication Dose Route Frequency Provider Last Rate Last Dose  . acetaminophen (TYLENOL) tablet 650 mg  650 mg Oral Q6H PRN Collette Pescador, MD      . acetaminophen (TYLENOL) tablet 650 mg  650 mg Oral Q6H PRN Nisaiah Bechtol, MD      . aspirin chewable tablet 81 mg  81 mg Oral Daily Joon Pohle, MD   81 mg at 11/16/16 0836  . [START ON 11/20/2016] cyanocobalamin ((VITAMIN B-12)) injection 1,000 mcg  1,000 mcg Intramuscular Q30 days Holland Kotter, MD      . cyclobenzaprine (FLEXERIL) tablet 10 mg  10 mg Oral TID PRN Jerica Creegan, MD      . dorzolamide-timolol (COSOPT) 22.3-6.8 MG/ML ophthalmic solution 1 drop  1 drop Both Eyes  BID Ramonita Lab, MD   1 drop at 11/16/16 0835  . dutasteride (AVODART) capsule 0.5 mg  0.5 mg Oral Daily Evone Arseneau, MD   0.5 mg at 11/16/16 0835  . enoxaparin (LOVENOX) injection 40 mg  40 mg Subcutaneous Q24H Londyn Hotard, MD   40 mg at 11/15/16 2207  . ferrous sulfate tablet 325 mg  325 mg Oral Q breakfast Veida Spira, MD   325 mg at 11/16/16 0836  . furosemide (LASIX) tablet 20 mg  20 mg Oral Daily PRN Deval Mroczka, MD      . insulin aspart (novoLOG) injection 0-5 Units  0-5 Units Subcutaneous QHS Anslie Spadafora, MD      . insulin aspart (novoLOG) injection  0-9 Units  0-9 Units Subcutaneous TID WC Saud Bail, MD   1 Units at 11/14/16 1731  . ketorolac (TORADOL) 30 MG/ML injection 15 mg  15 mg Intravenous Q6H PRN Marily Konczal, MD   15 mg at 11/15/16 1326  . lactated ringers infusion   Intravenous Continuous Berdine Addison, MD 10 mL/hr at 11/14/16 1207    . latanoprost (XALATAN) 0.005 % ophthalmic solution 1 drop  1 drop Both Eyes QHS Ashan Cueva, MD   1 drop at 11/15/16 2207  . ondansetron (ZOFRAN) tablet 4 mg  4 mg Oral Q6H PRN Terik Haughey, MD       Or  . ondansetron (ZOFRAN) injection 4 mg  4 mg Intravenous Q6H PRN Joreen Swearingin, MD      . oxyCODONE-acetaminophen (PERCOCET/ROXICET) 5-325 MG per tablet 1 tablet  1 tablet Oral Q6H PRN Lysette Lindenbaum, MD      . oxyCODONE-acetaminophen (PERCOCET/ROXICET) 5-325 MG per tablet 2 tablet  2 tablet Oral Q6H PRN Ramonita Lab, MD   2 tablet at 11/13/16 1819  . pantoprazole (PROTONIX) EC tablet 40 mg  40 mg Oral Daily Ustin Cruickshank, MD   40 mg at 11/16/16 0836  . piperacillin-tazobactam (ZOSYN) IVPB 3.375 g  3.375 g Intravenous Q8H Zayvier Caravello, MD 12.5 mL/hr at 11/16/16 0835 3.375 g at 11/16/16 0835  . polyethylene glycol (MIRALAX / GLYCOLAX) packet 17 g  17 g Oral Daily PRN Cam Harnden, MD      . rOPINIRole (REQUIP) tablet 0.25 mg  0.25 mg Oral QHS PRN Adeana Grilliot, MD      . vancomycin (VANCOCIN) IVPB 1000 mg/200 mL premix  1,000 mg Intravenous Q12H Jezabel Lecker, MD 200 mL/hr at 11/16/16 1125 1,000 mg at 11/16/16 1125     Discharge Medications: Please see discharge summary for a list of discharge medications.  Relevant Imaging Results:  Relevant Lab Results:   Additional Information  (SSN: [id number])    NWB to left foot  Ananda A Wylie Hail Work

## 2016-11-16 NOTE — Care Management (Signed)
Per PT patient unable to maintain weight bearing order to lower extremity- and will need short there rehab.  CSW updated.

## 2016-11-16 NOTE — Clinical Social Work Placement (Signed)
   CLINICAL SOCIAL WORK PLACEMENT  NOTE  Date:  11/16/2016  Patient Details  Name: Jeremy Herrera MRN: [id number] Date of Birth: Nov 24, 1940  Clinical Social Work is seeking post-discharge placement for this patient at the Skilled  Nursing Facility level of care (*CSW will initial, date and re-position this form in  chart as items are completed):  Yes   Patient/family provided with [place] Clinical Social Work Department's list of facilities offering this level of care within the geographic area requested by the patient (or if unable, by the patient's family).  Yes   Patient/family informed of their freedom to choose among providers that offer the needed level of care, that participate in Medicare, Medicaid or managed care program needed by the patient, have an available bed and are willing to accept the patient.  Yes   Patient/family informed of [facility]'s ownership interest in [facility] and [facility], as well as of the fact that they are under no obligation to receive care at these facilities.  PASRR submitted to EDS on       PASRR number received on       Existing PASRR number confirmed on 11/16/16     FL2 transmitted to all facilities in geographic area requested by pt/family on 11/16/16     FL2 transmitted to all facilities within larger geographic area on       Patient informed that his/her managed care company has contracts with or will negotiate with certain facilities, including the following:        Yes   Patient/family informed of bed offers received.  Patient chooses bed at  [facility] )     Physician recommends and patient chooses bed at      Patient to be transferred to   on  .  Patient to be transferred to facility by       Patient family notified on   of transfer.  Name of family member notified:        PHYSICIAN       Additional Comment:    _______________________________________________ Bronda Alfred, Darleen Crocker, LCSW 11/16/2016, 2:54  PM

## 2016-11-16 NOTE — Progress Notes (Addendum)
Speech Language Pathology Treatment: Dysphagia  Patient Details Name: Jeremy Herrera MRN: [id number] DOB: Dec 18, 1940 Today's Date: 11/16/2016 Time: 6256-3893 SLP Time Calculation (min) (ACUTE ONLY): 35 min  Assessment / Plan / Recommendation Clinical Impression  Pt seen today for f/u w/ education; ongoing toleration of diet. 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 Pt also had esophageal dilation ~10 years ago which reported alleviated several of his dysphagia symptoms but suspects this could be an ongoing issue for him. Pt noted to present with persistent throat clearing intermittently (sensation of saliva at laryngeal inlet?) prior to any PO trials - and was unchanged with PO. He reported he experiences "choking" incidents intermittently at baseline. He describes and states the swallow strategies he utilizes including small bites and sips, thorough mastication, subsequent swallows, and alternating foods/liquids(liquid washes) to assist.  The patient denied any acute changes in swallow function - dysphagia is at baseline he states. Pt consumed po trials of thin liquids via straw w/ no immediate coughing noted; no decline in vocal quality or respiratory status during po trials. No other overt s/sx of aspiration observed at bedside. Pt feeds self and is well awsare of his need to follow strategies and precautions but he described he continues to eat some foods which are challenging for him(steak). Pt has had no recent hx of PNA, reports thorough oral care, and pt is ambulatory and somewhat active at baseline as much as he can though his foot infection is hampering him and he will now to go rehab.  Recommend dysphagia 3 diet with thin liquids and adherence to  aspiration precautions and compensatory swallowing strategies. Pt is able to state back swallowing strategies and precautions he follows. Recommend pills in puree if easier for swallowing and clearing of the Esophagus. Recommend f/u w/ GI/ENT if any further c/o Esophageal phase issues and food dysmotility as he has the h/o Esophageal dilitation. NSG to reconsult if any change/decline in status while admitted. NSG updated.    HPI HPI: Jeremy Herrera  is a 76 y.o. male with a known history of diet-controlled diabetes mellitus, hyperlipidemia, hypertension, chronic atrial fibrillation on aspirin is presenting to the hospital as a direct admit from Dr. Alvera Singh office for acute left leg pain and infection. Pt seen this date s/p I&D plantar left foot ulcer with abscess 9/25.  Pt was reported to have been observed choking/coughing on food yesterday evening during dinner. Pt with known history of chronic dysphagia.  MBS completed 03/16/15 (+) moderate oropharyngeal and laryngeal phase dysphagia with notable moderate pharyngeal residue throughout the pharynx.  With small boluses of thin liquids alternated w/ soft, moist foods, pt feels he "manages ok". He stated he "knows" that when he tries to eat foods more tough and difficult for him that he "has more trouble so I have to be more careful". Neuromuscular impairment as well as the effects of laryngopharyngeal reflux (inflammation, edema, and resultant decreased sensation of the larynx and pharynx) were suggested on MBSS. Pt has reported hx of reflux for which he takes Nexium. He also reports that he will throat clear consistently throughout the day WITHOUT oral intake occurring and has episodes of choking/coughing on a daily basis at baseline. Pt is a very good historian and provided several details of dysphagia hx as well as utilized swallow strategies. No  recent hx of PNA. Pt is ambulatory and reports thorough oral care at home. Pt is admitted this admission for  Cellulitis of his Left food status post incision and drainage 11/14/2016 and now on antibiotics; Rehab is recommended.       SLP Plan  All goals met       Recommendations  Diet recommendations: Dysphagia 3 (mechanical soft);Thin liquid Liquids provided via: Cup;Straw Medication Administration: Whole meds with puree Supervision: Patient able to self feed;Intermittent supervision to cue for compensatory strategies Compensations: Minimize environmental distractions;Slow rate;Small sips/bites;Lingual sweep for clearance of pocketing;Multiple dry swallows after each bite/sip Postural Changes and/or Swallow Maneuvers: Seated upright 90 degrees;Upright 30-60 min after meal                General recommendations:  (Dietician f/u) Oral Care Recommendations: Oral care BID;Patient independent with oral care;Staff/trained caregiver to provide oral care Follow up Recommendations: None SLP Visit Diagnosis: Dysphagia, oropharyngeal phase (R13.12) Plan: All goals met       GO                Orinda Kenner, MS, CCC-SLP Watson,Katherine 11/16/2016, 1:45 PM

## 2016-11-16 NOTE — Progress Notes (Signed)
Medications administered by student RN [id number] with supervision of Clinical Instructor Rainey Rodger MSN, RN-BC or patient's assigned RN.   

## 2016-11-16 NOTE — Progress Notes (Signed)
[facility] - [place] at [facility]   PATIENT NAME: Jeremy Herrera    MR#:  [id number]  DATE OF BIRTH:  1940/08/17  SUBJECTIVE:  CHIEF COMPLAINT:  Patient lives alone and physical therapy is recommending skilled care facility  REVIEW OF SYSTEMS:  CONSTITUTIONAL: No fever, fatigue or weakness.  EYES: No blurred or double vision.  EARS, NOSE, AND THROAT: No tinnitus or ear pain.  RESPIRATORY: No cough, shortness of breath, wheezing or hemoptysis.  CARDIOVASCULAR: No chest pain, orthopnea, edema.  GASTROINTESTINAL: No nausea, vomiting, diarrhea or abdominal pain.  GENITOURINARY: No dysuria, hematuria.  ENDOCRINE: No polyuria, nocturia,  HEMATOLOGY: No anemia, easy bruising or bleeding SKIN: No rash or lesion. MUSCULOSKELETAL: left foot pain and swelling   NEUROLOGIC: No tingling, numbness, weakness.  PSYCHIATRY: No anxiety or depression.   DRUG ALLERGIES:   Allergies  Allergen Reactions  . Hydrocodone-Acetaminophen Nausea And Vomiting    VITALS:  Blood pressure 119/63, pulse 90, temperature 97.8 F (36.6 C), temperature source Oral, resp. rate 18, height  (1.88 m), weight 94.8 kg (209 lb), SpO2 98 %.  PHYSICAL EXAMINATION:  GENERAL:  76 y.o.-year-old patient lying in the bed with no acute distress.  EYES: Pupils equal, round, reactive to light and accommodation. No scleral icterus. Extraocular muscles intact.  HEENT: Head atraumatic, normocephalic. Oropharynx and nasopharynx clear.  NECK:  Supple, no jugular venous distention. No thyroid enlargement, no tenderness.  LUNGS: Normal breath sounds bilaterally, no wheezing, rales,rhonchi or crepitation. No use of accessory muscles of respiration.  CARDIOVASCULAR: S1, S2 normal. No murmurs, rubs, or gallops.  ABDOMEN: Soft, nontender, nondistended. Bowel sounds present. No organomegaly or mass.  EXTREMITIES: left foot and clean dressing; capillary refill is good No pedal edema, cyanosis, or clubbing.   NEUROLOGIC: Cranial nerves II through XII are intact. Sensation intact. Gait not checked.  PSYCHIATRIC: The patient is alert and oriented x 3.  SKIN: No obvious rash, lesion, or ulcer.    LABORATORY PANEL:   CBC  Recent Labs Lab 11/16/16 0616  WBC 6.0  HGB 13.2  HCT 38.1*  PLT 171   ------------------------------------------------------------------------------------------------------------------  Chemistries   Recent Labs Lab 11/13/16 1514 11/15/16 0918  NA 138 140  K 4.2 4.8  CL 106 107  CO2 25 25  GLUCOSE 108* 109*  BUN 19 19  CREATININE 1.01 1.17  CALCIUM 8.8* 8.8*  AST 26  --   ALT 29  --   ALKPHOS 152*  --   BILITOT 1.4*  --    ------------------------------------------------------------------------------------------------------------------  Cardiac Enzymes No results for input(s): TROPONINI in the last 168 hours. ------------------------------------------------------------------------------------------------------------------  RADIOLOGY:  No results found.  EKG:   Orders placed or performed during the hospital encounter of 04/12/16  . ED EKG  . ED EKG    ASSESSMENT AND PLAN:   Jeremy Herrera  is a 76 y.o. male with a known history of diet-controlled diabetes mellitus, hyperlipidemia, hypertension,chronic atrial fibrillation on aspirin is presenting to the hospital as a direct admit from Dr. Irene Limbo office. Patient went to see Dr. Ether Griffins today for acute left leg pain and infection. The left leg wound was dressed and patient is sent over to the hospital as a direct admit   # Acute lt foot pain 2/2 lt foot infection MRI of the left foot with no osteomyelitis but cellulitis status post incision and drainage 11/14/2016 Empiric IV antibiotics Zosyn and vancomycin,current discharge patient with by mouth Augmentin and ciprofloxacin Follow-up culture sensitivity-Gram-negative rods and gram-positive cocci Dr.  [facility] podiatry recommending  nonweightbearing on the left foot Pain management as needed Outpatient follow-up with podiatry in 1 week PT is recommending skilled care  #Diabetes mellitus diet controlled  hemoglobin A1c 5.3 Sliding scale insulin  #Chronic atrial fibrillation rate controlled Resume aspirin  Sees Dr. Gwen Pounds as an outpatient  #essential hypertension Continue home medication Avodart  PT consult placed- snf  DVT prophylaxis with SCDs GI prophylaxis with Nexium     All the records are reviewed and case discussed with Care Management/Social Workerr. Management plans discussed with the patient, family and they are in agreement.  CODE STATUS: fc  TOTAL TIME TAKING CARE OF THIS PATIENT: 35  minutes.   POSSIBLE D/C IN am DAYS, DEPENDING ON CLINICAL CONDITION.  Note: This dictation was prepared with Dragon dictation along with smaller phrase technology. Any transcriptional errors that result from this process are unintentional.   Ramonita Lab M.D on 11/16/2016 at 2:16 PM  Between 7am to 6pm - Pager - [phone] After 6pm go to [email or web address] - password [email or web address] [facility]  [employer] [facility] [place] [facility]  Office  [phone]  CC: Primary care physician; Marina Goodell, MD

## 2016-11-17 LAB — GLUCOSE, CAPILLARY
GLUCOSE-CAPILLARY: 91 mg/dL (ref 65–99)
GLUCOSE-CAPILLARY: 106 mg/dL — AB (ref 65–99)

## 2016-11-17 MED ORDER — CIPROFLOXACIN HCL 500 MG PO TABS: 500.0000 mg | ORAL_TABLET | Freq: Two times a day (BID) | ORAL | 0 refills | Status: DC

## 2016-11-17 MED ORDER — INSULIN ASPART 100 UNIT/ML ~~LOC~~ SOLN: 0.0000 [IU] | Freq: Three times a day (TID) | SUBCUTANEOUS | 11 refills | Status: DC

## 2016-11-17 MED ORDER — GABAPENTIN 100 MG PO CAPS: 100.0000 mg | ORAL_CAPSULE | Freq: Three times a day (TID) | ORAL | Status: DC

## 2016-11-17 MED ORDER — OXYCODONE-ACETAMINOPHEN 5-325 MG PO TABS: 2.0000 | ORAL_TABLET | Freq: Four times a day (QID) | ORAL | 0 refills | Status: DC | PRN

## 2016-11-17 MED ORDER — POLYETHYLENE GLYCOL 3350 17 G PO PACK: 17.0000 g | PACK | Freq: Every day | ORAL | 0 refills | Status: DC | PRN

## 2016-11-17 MED ORDER — ONDANSETRON HCL 4 MG PO TABS: 4.0000 mg | ORAL_TABLET | Freq: Four times a day (QID) | ORAL | 0 refills | Status: DC | PRN

## 2016-11-17 MED ORDER — AMOXICILLIN-POT CLAVULANATE 875-125 MG PO TABS: 1.0000 | ORAL_TABLET | Freq: Two times a day (BID) | ORAL | 0 refills | Status: DC

## 2016-11-17 NOTE — Progress Notes (Signed)
Patient discharged to [facility] per MD order. Report called to Holly Bodily RN at facility. EMS called to transport.

## 2016-11-17 NOTE — Clinical Social Work Placement (Addendum)
   CLINICAL SOCIAL WORK PLACEMENT  NOTE  Date:  11/17/2016  Patient Details  Name: Jeremy Herrera MRN: [id number] Date of Birth: December 04, 1940  Clinical Social Work is seeking post-discharge placement for this patient at the Skilled  Nursing Facility level of care (*CSW will initial, date and re-position this form in  chart as items are completed):  Yes   Patient/family provided with [place] Clinical Social Work Department's list of facilities offering this level of care within the geographic area requested by the patient (or if unable, by the patient's family).  Yes   Patient/family informed of their freedom to choose among providers that offer the needed level of care, that participate in Medicare, Medicaid or managed care program needed by the patient, have an available bed and are willing to accept the patient.  Yes   Patient/family informed of [facility]'s ownership interest in [facility] and [facility], as well as of the fact that they are under no obligation to receive care at these facilities.  PASRR submitted to EDS on       PASRR number received on       Existing PASRR number confirmed on 11/16/16     FL2 transmitted to all facilities in geographic area requested by pt/family on 11/16/16     FL2 transmitted to all facilities within larger geographic area on       Patient informed that his/her managed care company has contracts with or will negotiate with certain facilities, including the following:        Yes   Patient/family informed of bed offers received.  Patient chooses bed at  [facility] )     Physician recommends and patient chooses bed at      Patient to be transferred to  [facility] ) on 11/17/16.  Patient to be transferred to facility by  [facility] )     Patient family notified on 11/17/16 of transfer.  Name of family member notified:   (CSW left patient's daughter [name] a Engineer, technical sales.  )  Patinet's daughter [name] called CSW back  and is in agreement with D/C plan.   PHYSICIAN       Additional Comment:    _______________________________________________ Atticus Lemberger, Darleen Crocker, LCSW 11/17/2016, 2:09 PM

## 2016-11-17 NOTE — Care Management Important Message (Signed)
Important Message  Patient Details  Name: Jeremy Herrera MRN: [id number] Date of Birth: [birth year]   Medicare Important Message Given:  Yes    Gwenette Greet, RN 11/17/2016, 8:38 AM

## 2016-11-17 NOTE — Progress Notes (Signed)
[employer] authorization has been received, auth # [id number], RVB. Clinical Child psychotherapist (CSW) contacted [facility] and made them aware of above. Patient can D/C to [facility] when medically stable.   [employer], LCSW [phone]

## 2016-11-17 NOTE — Discharge Instructions (Signed)
Dysphagia 3 diet Follow-up with podiatry Dr. Ether Griffins in 1 week Continue wound care Follow-up with primary care physician in 3-5 days

## 2016-11-17 NOTE — Progress Notes (Signed)
Patient is medically stable for D/C to [facility] today. Per [facility] admissions coordinator at [facility] patient will come to room E-6. RN will call report and arrange EMS for transport. [employer] authorization has been received through [employer] Clinical Child psychotherapist (CSW) sent D/C orders to [facility] via [employer]. Patient is aware of above. CSW contacted patient's daughter [name] and made her aware of above. Please reconsult if future social work needs arise. CSW signing off.   [employer], LCSW [phone]

## 2016-11-17 NOTE — Discharge Summary (Addendum)
[facility] [facility] [place] NAME: Randon Somera    MR#:  [id number]  DATE OF BIRTH:  1940/12/29  DATE OF ADMISSION:  11/13/2016 ADMITTING PHYSICIAN: [name], MD  DATE OF DISCHARGE:  11/17/16  PRIMARY CARE PHYSICIAN: Sofie Hartigan, MD    ADMISSION DIAGNOSIS:  Diabetic foot infection n/a  DISCHARGE DIAGNOSIS:  Active Problems:   Diabetic foot ulcer ([place])   SECONDARY DIAGNOSIS:   Past Medical History:  Diagnosis Date  . Anemia   . BPH (benign prostatic hyperplasia)   . CHF (congestive heart failure) ([place])   . Dysphagia   . GERD (gastroesophageal reflux disease)   . HLD (hyperlipidemia)   . HTN (hypertension)   . Irregular heart beat   . Nocturia   . Urinary retention     HOSPITAL COURSE:  HPI  Nell Schrack  is a 76 y.o. male with a known history of diet-controlled diabetes mellitus, hyperlipidemia, hypertension,chronic atrial fibrillation on aspirin is presenting to the hospital as a direct admit from Dr. Alvera Singh office. Patient went to see Dr. Vickki Muff today for acute left leg pain and infection. The left leg wound was dressed and patient is sent over to the hospital as a direct admit to rule out osteomyelitis  # Acute lt foot pain 2/2 lt foot infection MRI of the left foot with no osteomyelitis but cellulitis status post incision and drainage 11/14/2016 Empiric IV antibiotics Zosyn and vancomycin,current discharge patient with by mouth Augmentin and ciprofloxacin Follow-up culture sensitivity-Gram-negative rods and gram-positive cocci Dr. Vickki Muff podiatry recommending nonweightbearing on the left foot Pain management as needed Outpatient follow-up with podiatry in 1 week PT is recommending skilled care  #Diabetes mellitus diet controlled  hemoglobin A1c 5.3 Sliding scale insulin during hospital course  #Chronic atrial fibrillation rate controlled Resume aspirin  Sees Dr. Nehemiah Massed as an  outpatient  #essential hypertension Continue home medication Avodart  PT consult snf  DISCHARGE CONDITIONS:   stable  CONSULTS OBTAINED:     PROCEDURES Incision and drainage of left foot  DRUG ALLERGIES:   Allergies  Allergen Reactions  . Hydrocodone-Acetaminophen Nausea And Vomiting    DISCHARGE MEDICATIONS:   Current Discharge Medication List    START taking these medications   Details  amoxicillin-clavulanate (AUGMENTIN) 875-125 MG tablet Take 1 tablet by mouth every 12 (twelve) hours. Qty: 28 tablet, Refills: 0    ciprofloxacin (CIPRO) 500 MG tablet Take 1 tablet (500 mg total) by mouth 2 (two) times daily. Qty: 28 tablet, Refills: 0    insulin aspart (NOVOLOG) 100 UNIT/ML injection Inject 0-9 Units into the skin 3 (three) times daily with meals. Qty: 10 mL, Refills: 11    ondansetron (ZOFRAN) 4 MG tablet Take 1 tablet (4 mg total) by mouth every 6 (six) hours as needed for nausea. Qty: 20 tablet, Refills: 0    polyethylene glycol (MIRALAX / GLYCOLAX) packet Take 17 g by mouth daily as needed for mild constipation. Qty: 14 each, Refills: 0      CONTINUE these medications which have CHANGED   Details  gabapentin (NEURONTIN) 100 MG capsule Take 1 capsule (100 mg total) by mouth 3 (three) times daily.    oxyCODONE-acetaminophen (PERCOCET) 5-325 MG tablet Take 2 tablets by mouth every 6 (six) hours as needed for moderate pain or severe pain. Qty: 20 tablet, Refills: 0      CONTINUE these medications which have NOT CHANGED   Details  aspirin 81 MG tablet Take 81 mg  by mouth daily.    bimatoprost (LUMIGAN) 0.01 % SOLN Place 1 drop into both eyes 2 (two) times daily.     dorzolamide-timolol (COSOPT) 22.3-6.8 MG/ML ophthalmic solution Place 1 drop into both eyes 2 (two) times daily.     dutasteride (AVODART) 0.5 MG capsule Take 1 capsule (0.5 mg total) by mouth daily. Qty: 30 capsule, Refills: 12    esomeprazole (NEXIUM) 20 MG capsule Take 20 mg by  mouth daily at 12 noon.    ferrous sulfate 325 (65 FE) MG tablet Take 325 mg by mouth daily.    acetaminophen (TYLENOL) 500 MG tablet Take by mouth.    Cyanocobalamin (B-12) 1000 MCG/ML KIT Inject 1 mL as directed every 30 (thirty) days.    cyclobenzaprine (FLEXERIL) 10 MG tablet Take 1 tablet (10 mg total) by mouth 3 (three) times daily as needed for muscle spasms. Qty: 30 tablet, Refills: 0    furosemide (LASIX) 20 MG tablet Take 20 mg by mouth daily as needed for fluid. Try to limit to no more than 3-4 times per week.    mupirocin ointment (BACTROBAN) 2 % Apply 1 application topically 3 (three) times daily. Qty: 22 g, Refills: 0    rOPINIRole (REQUIP) 0.25 MG tablet Take 0.25 mg by mouth at bedtime as needed.       STOP taking these medications     Naproxen Sodium (ALEVE) 220 MG CAPS      tadalafil (CIALIS) 5 MG tablet          DISCHARGE INSTRUCTIONS:  Dysphagia 3 diet Follow-up with podiatry Dr. Vickki Muff in 1 week Continue wound care Follow-up with primary care physician in 3-5 days  Follow-up with Dr. Nehemiah Massed cardiology in 2 weeks  DIET:  Dysphagia 3 diet  DISCHARGE CONDITION:  Stable  ACTIVITY:  Activity as tolerated ,No weight bearing on the left foot  OXYGEN:  Home Oxygen: No.   Oxygen Delivery: room air  DISCHARGE LOCATION:  nursing home   If you experience worsening of your admission symptoms, develop shortness of breath, life threatening emergency, suicidal or homicidal thoughts you must seek medical attention immediately by calling 911 or calling your MD immediately  if symptoms less severe.  You Must read complete instructions/literature along with all the possible adverse reactions/side effects for all the Medicines you take and that have been prescribed to you. Take any new Medicines after you have completely understood and accpet all the possible adverse reactions/side effects.   Please note  You were cared for by a hospitalist during your  hospital stay. If you have any questions about your discharge medications or the care you received while you were in the hospital after you are discharged, you can call the unit and asked to speak with the hospitalist on call if the hospitalist that took care of you is not available. Once you are discharged, your primary care physician will handle any further medical issues. Please note that NO REFILLS for any discharge medications will be authorized once you are discharged, as it is imperative that you return to your primary care physician (or establish a relationship with a primary care physician if you do not have one) for your aftercare needs so that they can reassess your need for medications and monitor your lab values.     Today  No chief complaint on file.  Patient is feeling fine and denies any complaints today. Left foot pain well controlled with the pain medicines  ROS:  CONSTITUTIONAL: Denies fevers, chills.  Denies any fatigue, weakness.  EYES: Denies blurry vision, double vision, eye pain. EARS, NOSE, THROAT: Denies tinnitus, ear pain, hearing loss. RESPIRATORY: Denies cough, wheeze, shortness of breath.  CARDIOVASCULAR: Denies chest pain, palpitations, edema.  GASTROINTESTINAL: Denies nausea, vomiting, diarrhea, abdominal pain. Denies bright red blood per rectum. GENITOURINARY: Denies dysuria, hematuria. ENDOCRINE: Denies nocturia or thyroid problems. HEMATOLOGIC AND LYMPHATIC: Denies easy bruising or bleeding. SKIN: Denies rash or lesion. MUSCULOSKELETAL: Left foot pain  NEUROLOGIC: Denies paralysis, paresthesias.  PSYCHIATRIC: Denies anxiety or depressive symptoms.   VITAL SIGNS:  Blood pressure 139/85, pulse 85, temperature 97.8 F (36.6 C), temperature source Oral, resp. rate 18, height 6' 2"  (1.88 m), weight 94.8 kg (209 lb), SpO2 96 %.  I/O:    Intake/Output Summary (Last 24 hours) at 11/17/16 1338 Last data filed at 11/17/16 1220  Gross per 24 hour  Intake               240 ml  Output             1500 ml  Net            -1260 ml    PHYSICAL EXAMINATION:  GENERAL:  76 y.o.-year-old patient lying in the bed with no acute distress.  EYES: Pupils equal, round, reactive to light and accommodation. No scleral icterus. Extraocular muscles intact.  HEENT: Head atraumatic, normocephalic. Oropharynx and nasopharynx clear.  NECK:  Supple, no jugular venous distention. No thyroid enlargement, no tenderness.  LUNGS: Normal breath sounds bilaterally, no wheezing, rales,rhonchi or crepitation. No use of accessory muscles of respiration.  CARDIOVASCULAR: S1, S2 normal. No murmurs, rubs, or gallops.  ABDOMEN: Soft, non-tender, non-distended. Bowel sounds present. No organomegaly or mass.  EXTREMITIES: Left foot is wrapped in clean dressing No cyanosis, or clubbing.  NEUROLOGIC: Cranial nerves II through XII are intact. Sensation intact. Gait not checked.  PSYCHIATRIC: The patient is alert and oriented x 3.  SKIN: No obvious rash, lesion, or ulcer.   DATA REVIEW:   CBC  Recent Labs Lab 11/16/16 0616  WBC 6.0  HGB 13.2  HCT 38.1*  PLT 171    Chemistries   Recent Labs Lab 11/13/16 1514 11/15/16 0918  NA 138 140  K 4.2 4.8  CL 106 107  CO2 25 25  GLUCOSE 108* 109*  BUN 19 19  CREATININE 1.01 1.17  CALCIUM 8.8* 8.8*  AST 26  --   ALT 29  --   ALKPHOS 152*  --   BILITOT 1.4*  --     Cardiac Enzymes No results for input(s): TROPONINI in the last 168 hours.  Microbiology Results  Results for orders placed or performed during the hospital encounter of 11/13/16  Aerobic/Anaerobic Culture (surgical/deep wound)     Status: Abnormal (Preliminary result)   Collection Time: 11/14/16  8:31 AM  Result Value Ref Range Status   Specimen Description FOOT LEFT FOOT  Final   Special Requests Normal  Final   Gram Stain   Final    RARE WBC PRESENT, PREDOMINANTLY PMN MODERATE GRAM NEGATIVE RODS MODERATE GRAM POSITIVE COCCI    Culture (A)  Final     MULTIPLE ORGANISMS PRESENT, NONE PREDOMINANT HOLDING FOR POSSIBLE ANAEROBE Performed at [facility], [address], [place], [place] [address]    Report Status PENDING  Incomplete  Surgical PCR screen     Status: None   Collection Time: 11/14/16  8:38 AM  Result Value Ref Range Status   MRSA, PCR NEGATIVE NEGATIVE  Final   Staphylococcus aureus NEGATIVE NEGATIVE Final    Comment: (NOTE) The Xpert SA Assay (FDA approved for NASAL specimens in patients 22 years of age and older), is one component of a comprehensive surveillance program. It is not intended to diagnose infection nor to guide or monitor treatment.   Aerobic/Anaerobic Culture (surgical/deep wound)     Status: None (Preliminary result)   Collection Time: 11/14/16  1:14 PM  Result Value Ref Range Status   Specimen Description WOUND  Final   Special Requests FOOT  Final   Gram Stain   Final    FEW WBC PRESENT,BOTH PMN AND MONONUCLEAR NO ORGANISMS SEEN Performed at [facility], [address], [place], [place] [address]    Culture   Final    RARE NORMAL SKIN FLORA NO ANAEROBES ISOLATED; CULTURE IN PROGRESS FOR 5 DAYS    Report Status PENDING  Incomplete    RADIOLOGY:  Mr Foot Left Wo Contrast  Result Date: 11/13/2016 CLINICAL DATA:  Diabetic patient with left foot swelling and pain. Leg wound was dressed and patient was sent for imaging. EXAM: MRI OF THE LEFT FOOT WITHOUT CONTRAST TECHNIQUE: Multiplanar, multisequence MR imaging of the left foot was performed. No intravenous contrast was administered. COMPARISON:  None. FINDINGS: The study was limited by motion artifacts on the axial T2 weighted fat saturated images. Bones/Joint/Cartilage No acute fracture nor bone destruction. No evidence for acute osteomyelitis. No joint dislocations. Ligaments Noncontributory Muscles and Tendons No pyomyositis. No muscle atrophy. Intact tendons crossing the foot. Soft tissues Soft tissue swelling about the medial and  lateral aspect of the hindfoot and ankle more so laterally. No focal abscess. IMPRESSION: Soft tissue swelling about the ankle consistent with cellulitis. No abscess or evidence of acute osteomyelitis. Electronically Signed   By: [name] M.D.   On: 11/13/2016 21:27    EKG:   Orders placed or performed during the hospital encounter of 04/12/16  . ED EKG  . ED EKG      Management plans discussed with the patient, family and they are in agreement.  CODE STATUS:     Code Status Orders        Start     Ordered   11/13/16 1335  Full code  Continuous     11/13/16 1337    Code Status History    Date Active Date Inactive Code Status Order ID Comments User Context   This patient has a current code status but no historical code status.    Advance Directive Documentation     Most Recent Value  Type of Advance Directive  Healthcare Power of Attorney, Living will  Pre-existing out of facility DNR order (yellow form or pink MOST form)  -  "MOST" Form in Place?  -      TOTAL TIME TAKING CARE OF THIS PATIENT: 45  minutes.   Note: This dictation was prepared with Dragon dictation along with smaller phrase technology. Any transcriptional errors that result from this process are unintentional.   @MEC @  on 11/17/2016 at 1:38 PM  Between 7am to 6pm - Pager - [phone]  After 6pm go to [email or web address] - password [email or web address] [facility]  [facility]  Office  [phone]  CC: Primary care physician; Sofie Hartigan, MD

## 2016-11-19 LAB — AEROBIC/ANAEROBIC CULTURE W GRAM STAIN (SURGICAL/DEEP WOUND)
Culture: NORMAL
Special Requests: NORMAL

## 2016-11-22 ENCOUNTER — Inpatient Hospital Stay: Payer: Medicare PPO | Attending: Hematology and Oncology

## 2016-11-22 ENCOUNTER — Inpatient Hospital Stay: Payer: Medicare PPO

## 2016-12-18 ENCOUNTER — Encounter: Payer: Self-pay | Admitting: Emergency Medicine

## 2016-12-18 ENCOUNTER — Observation Stay
Admission: EM | Admit: 2016-12-18 | Discharge: 2016-12-19 | Disposition: A | Discharge status: Home / Self Care | Payer: Medicare PPO | Attending: Internal Medicine | Admitting: Internal Medicine

## 2016-12-18 ENCOUNTER — Emergency Department: Payer: Medicare PPO

## 2016-12-18 DIAGNOSIS — E782 Mixed hyperlipidemia: Secondary | ICD-10-CM | POA: Insufficient documentation

## 2016-12-18 DIAGNOSIS — R4182 Altered mental status, unspecified: Secondary | ICD-10-CM | POA: Diagnosis not present

## 2016-12-18 DIAGNOSIS — Z79899 Other long term (current) drug therapy: Secondary | ICD-10-CM | POA: Diagnosis not present

## 2016-12-18 DIAGNOSIS — R1110 Vomiting, unspecified: Secondary | ICD-10-CM

## 2016-12-18 DIAGNOSIS — I110 Hypertensive heart disease with heart failure: Secondary | ICD-10-CM | POA: Insufficient documentation

## 2016-12-18 DIAGNOSIS — E860 Dehydration: Secondary | ICD-10-CM | POA: Diagnosis not present

## 2016-12-18 DIAGNOSIS — Z794 Long term (current) use of insulin: Secondary | ICD-10-CM | POA: Insufficient documentation

## 2016-12-18 DIAGNOSIS — R197 Diarrhea, unspecified: Principal | ICD-10-CM | POA: Insufficient documentation

## 2016-12-18 DIAGNOSIS — R109 Unspecified abdominal pain: Secondary | ICD-10-CM | POA: Diagnosis present

## 2016-12-18 DIAGNOSIS — R51 Headache: Secondary | ICD-10-CM | POA: Diagnosis not present

## 2016-12-18 DIAGNOSIS — R112 Nausea with vomiting, unspecified: Secondary | ICD-10-CM | POA: Insufficient documentation

## 2016-12-18 DIAGNOSIS — Z885 Allergy status to narcotic agent status: Secondary | ICD-10-CM | POA: Diagnosis not present

## 2016-12-18 DIAGNOSIS — R110 Nausea: Secondary | ICD-10-CM | POA: Diagnosis present

## 2016-12-18 DIAGNOSIS — K219 Gastro-esophageal reflux disease without esophagitis: Secondary | ICD-10-CM | POA: Diagnosis not present

## 2016-12-18 DIAGNOSIS — I482 Chronic atrial fibrillation: Secondary | ICD-10-CM | POA: Insufficient documentation

## 2016-12-18 DIAGNOSIS — I5032 Chronic diastolic (congestive) heart failure: Secondary | ICD-10-CM | POA: Insufficient documentation

## 2016-12-18 DIAGNOSIS — R531 Weakness: Secondary | ICD-10-CM | POA: Diagnosis not present

## 2016-12-18 DIAGNOSIS — E11621 Type 2 diabetes mellitus with foot ulcer: Secondary | ICD-10-CM | POA: Diagnosis not present

## 2016-12-18 DIAGNOSIS — L97523 Non-pressure chronic ulcer of other part of left foot with necrosis of muscle: Secondary | ICD-10-CM

## 2016-12-18 LAB — CBC
HEMATOCRIT: 37.7 % — AB (ref 40.0–52.0)
Hemoglobin: 12.9 g/dL — ABNORMAL LOW (ref 13.0–18.0)
MCH: 31.2 pg (ref 26.0–34.0)
MCHC: 34.4 g/dL (ref 32.0–36.0)
MCV: 90.8 fL (ref 80.0–100.0)
Platelets: 143 10*3/uL — ABNORMAL LOW (ref 150–440)
RBC: 4.15 MIL/uL — AB (ref 4.40–5.90)
RDW: 13.2 % (ref 11.5–14.5)
WBC: 7.2 10*3/uL (ref 3.8–10.6)

## 2016-12-18 LAB — LIPASE, BLOOD: LIPASE: 30 U/L (ref 11–51)

## 2016-12-18 LAB — URINALYSIS, COMPLETE (UACMP) WITH MICROSCOPIC
BACTERIA UA: NONE SEEN
BILIRUBIN URINE: NEGATIVE
Glucose, UA: NEGATIVE mg/dL
HGB URINE DIPSTICK: NEGATIVE
KETONES UR: NEGATIVE mg/dL
LEUKOCYTES UA: NEGATIVE
NITRITE: NEGATIVE
PH: 6 (ref 5.0–8.0)
Protein, ur: NEGATIVE mg/dL
Specific Gravity, Urine: 1.016 (ref 1.005–1.030)

## 2016-12-18 LAB — COMPREHENSIVE METABOLIC PANEL
ALT: 13 U/L — ABNORMAL LOW (ref 17–63)
ANION GAP: 7 (ref 5–15)
AST: 18 U/L (ref 15–41)
Albumin: 3.7 g/dL (ref 3.5–5.0)
Alkaline Phosphatase: 120 U/L (ref 38–126)
BILIRUBIN TOTAL: 1 mg/dL (ref 0.3–1.2)
BUN: 19 mg/dL (ref 6–20)
CHLORIDE: 108 mmol/L (ref 101–111)
CO2: 24 mmol/L (ref 22–32)
Calcium: 8.8 mg/dL — ABNORMAL LOW (ref 8.9–10.3)
Creatinine, Ser: 0.97 mg/dL (ref 0.61–1.24)
Glucose, Bld: 120 mg/dL — ABNORMAL HIGH (ref 65–99)
POTASSIUM: 4 mmol/L (ref 3.5–5.1)
Sodium: 139 mmol/L (ref 135–145)
TOTAL PROTEIN: 6.9 g/dL (ref 6.5–8.1)

## 2016-12-18 LAB — TROPONIN I

## 2016-12-18 LAB — GLUCOSE, CAPILLARY: GLUCOSE-CAPILLARY: 106 mg/dL — AB (ref 65–99)

## 2016-12-18 LAB — LACTIC ACID, PLASMA: LACTIC ACID, VENOUS: 1.3 mmol/L (ref 0.5–1.9)

## 2016-12-18 MED ORDER — ACETAMINOPHEN 650 MG RE SUPP: 650.0000 mg | Freq: Four times a day (QID) | RECTAL | Status: DC | PRN

## 2016-12-18 MED ORDER — DIPHENOXYLATE-ATROPINE 2.5-0.025 MG PO TABS: 2.0000 | ORAL_TABLET | Freq: Once | ORAL | Status: DC

## 2016-12-18 MED ORDER — LOPERAMIDE HCL 2 MG PO CAPS
4.0000 mg | ORAL_CAPSULE | Freq: Once | ORAL | Status: AC
  Administered 2016-12-18: 4 mg via ORAL
  Filled 2016-12-18: qty 2

## 2016-12-18 MED ORDER — ONDANSETRON HCL 4 MG/2ML IJ SOLN: 4.0000 mg | Freq: Four times a day (QID) | INTRAMUSCULAR | Status: DC | PRN

## 2016-12-18 MED ORDER — SODIUM CHLORIDE 0.9 % IV SOLN
INTRAVENOUS | Status: DC
  Administered 2016-12-18 – 2016-12-19 (×2): via INTRAVENOUS

## 2016-12-18 MED ORDER — INSULIN ASPART 100 UNIT/ML ~~LOC~~ SOLN: 0.0000 [IU] | Freq: Three times a day (TID) | SUBCUTANEOUS | Status: DC

## 2016-12-18 MED ORDER — ONDANSETRON HCL 4 MG PO TABS: 4.0000 mg | ORAL_TABLET | Freq: Four times a day (QID) | ORAL | Status: DC | PRN

## 2016-12-18 MED ORDER — ENOXAPARIN SODIUM 40 MG/0.4ML ~~LOC~~ SOLN
40.0000 mg | SUBCUTANEOUS | Status: DC
  Administered 2016-12-18: 40 mg via SUBCUTANEOUS
  Filled 2016-12-18: qty 0.4

## 2016-12-18 MED ORDER — ACETAMINOPHEN 325 MG PO TABS
650.0000 mg | ORAL_TABLET | Freq: Once | ORAL | Status: AC
  Administered 2016-12-18: 650 mg via ORAL
  Filled 2016-12-18: qty 2

## 2016-12-18 MED ORDER — SODIUM CHLORIDE 0.9 % IV BOLUS (SEPSIS)
1000.0000 mL | Freq: Once | INTRAVENOUS | Status: AC
  Administered 2016-12-18: 1000 mL via INTRAVENOUS

## 2016-12-18 MED ORDER — ACETAMINOPHEN 325 MG PO TABS: 650.0000 mg | ORAL_TABLET | Freq: Four times a day (QID) | ORAL | Status: DC | PRN

## 2016-12-18 MED ORDER — ONDANSETRON HCL 4 MG/2ML IJ SOLN
4.0000 mg | Freq: Once | INTRAMUSCULAR | Status: AC
  Administered 2016-12-18: 4 mg via INTRAVENOUS
  Filled 2016-12-18: qty 2

## 2016-12-18 MED ORDER — SENNOSIDES-DOCUSATE SODIUM 8.6-50 MG PO TABS: 1.0000 | ORAL_TABLET | Freq: Every evening | ORAL | Status: DC | PRN

## 2016-12-18 NOTE — H&P (Signed)
[place] [facility] [place] NAME: Jeremy Herrera    MR#:  [id number]  DATE OF BIRTH:  [birth year]  DATE OF ADMISSION:  12/18/2016  PRIMARY CARE PHYSICIAN: Sofie Hartigan, MD   REQUESTING/REFERRING PHYSICIAN: dr Reita Cliche  CHIEF COMPLAINT:   Nausea and vomiting HISTORY OF PRESENT ILLNESS:  Jeremy Herrera  is a 76 y.o. male with a known history of chronic atrial fibrillation, diabetes, chronic diastolic heart failure and recent admission to the hospital due to diabetic foot ulcer who presents to the emergency room due to above complaint.  Since last night patient has had nausea vomiting and diarrhea.  He denies any sick contacts.  He denies any abdominal pain or dark colored stools.  He denies eating anything abnormal.  He was discharged from the hospital and sent to rehab where he was in rehab for 3 weeks and then has been home with his daughter for the past week.  He has been in his usual state of health until last night.  He comes in with profound dehydration and weakness.  He has received 2 L of normal saline while in the emergency room however has profound weakness.  PAST MEDICAL HISTORY:   Past Medical History:  Diagnosis Date  . Anemia   . BPH (benign prostatic hyperplasia)   . CHF (congestive heart failure) ([place])   . Dysphagia   . GERD (gastroesophageal reflux disease)   . HLD (hyperlipidemia)   . HTN (hypertension)   . Irregular heart beat   . Nocturia   . Urinary retention     PAST SURGICAL HISTORY:   Past Surgical History:  Procedure Laterality Date  . bladder patching  1964  . HIP SURGERY Right 2014  . IRRIGATION AND DEBRIDEMENT FOOT Left 11/14/2016   Procedure: IRRIGATION AND DEBRIDEMENT FOOT;  Surgeon: Samara Deist, DPM;  Location: [facility] ORS;  Service: Podiatry;  Laterality: Left;  . TRANSURETHRAL RESECTION OF PROSTATE      SOCIAL HISTORY:   Social History  Substance Use Topics  . Smoking status: Never Smoker  . Smokeless  tobacco: Never Used  . Alcohol use No    FAMILY HISTORY:   Family History  Problem Relation Age of Onset  . Ulcers Mother        stomach  . GER disease Sister        GERD  . Heart attack Father   . Diabetes Brother   . Depression Brother   . Kidney disease Neg Hx   . Prostate cancer Neg Hx   . Kidney cancer Neg Hx   . Bladder Cancer Neg Hx     DRUG ALLERGIES:   Allergies  Allergen Reactions  . Hydrocodone-Acetaminophen Nausea And Vomiting    REVIEW OF SYSTEMS:   Review of Systems  Constitutional: Positive for malaise/fatigue. Negative for chills and fever.  HENT: Negative.  Negative for ear discharge, ear pain, hearing loss, nosebleeds and sore throat.   Eyes: Negative.  Negative for blurred vision and pain.  Respiratory: Negative.  Negative for cough, hemoptysis, shortness of breath and wheezing.   Cardiovascular: Negative.  Negative for chest pain, palpitations and leg swelling.  Gastrointestinal: Positive for diarrhea, nausea and vomiting. Negative for abdominal pain and blood in stool.  Genitourinary: Negative.  Negative for dysuria.  Musculoskeletal: Negative.  Negative for back pain.  Skin: Negative.   Neurological: Positive for weakness. Negative for dizziness, tremors, speech change, focal weakness, seizures and headaches.  Endo/Heme/Allergies: Negative.  Does not  bruise/bleed easily.  Psychiatric/Behavioral: Negative.  Negative for depression, hallucinations and suicidal ideas.    MEDICATIONS AT HOME:   Prior to Admission medications   Medication Sig Start Date End Date Taking? Authorizing Provider  acetaminophen (TYLENOL) 500 MG tablet Take by mouth.   Yes [provider]  amoxicillin-clavulanate (AUGMENTIN) 875-125 MG tablet Take 1 tablet by mouth every 12 (twelve) hours. 11/17/16   [name], MD  aspirin 81 MG tablet Take 81 mg by mouth daily.    [provider]  bimatoprost (LUMIGAN) 0.01 % SOLN Place 1 drop into both eyes 2 (two)  times daily.     [provider]  ciprofloxacin (CIPRO) 500 MG tablet Take 1 tablet (500 mg total) by mouth 2 (two) times daily. 11/17/16   Gouru, Illene Silver, MD  Cyanocobalamin (B-12) 1000 MCG/ML KIT Inject 1 mL as directed every 30 (thirty) days.    [provider]  cyclobenzaprine (FLEXERIL) 10 MG tablet Take 1 tablet (10 mg total) by mouth 3 (three) times daily as needed for muscle spasms. 04/12/16   Earleen Newport, MD  dorzolamide-timolol (COSOPT) 22.3-6.8 MG/ML ophthalmic solution Place 1 drop into both eyes 2 (two) times daily.     [provider]  dutasteride (AVODART) 0.5 MG capsule Take 1 capsule (0.5 mg total) by mouth daily. 07/08/15   Zara Council A, PA-C  esomeprazole (NEXIUM) 20 MG capsule Take 20 mg by mouth daily at 12 noon.    [provider]  ferrous sulfate 325 (65 FE) MG tablet Take 325 mg by mouth daily.    [provider]  furosemide (LASIX) 20 MG tablet Take 20 mg by mouth daily as needed for fluid. Try to limit to no more than 3-4 times per week. 11/23/14   [provider]  gabapentin (NEURONTIN) 100 MG capsule Take 1 capsule (100 mg total) by mouth 3 (three) times daily. 11/17/16   Gouru, Illene Silver, MD  insulin aspart (NOVOLOG) 100 UNIT/ML injection Inject 0-9 Units into the skin 3 (three) times daily with meals. 11/17/16   [name], MD  mupirocin ointment (BACTROBAN) 2 % Apply 1 application topically 3 (three) times daily. Patient not taking: Reported on 11/13/2016 05/05/16   Crecencio Mc P, PA-C  ondansetron (ZOFRAN) 4 MG tablet Take 1 tablet (4 mg total) by mouth every 6 (six) hours as needed for nausea. 11/17/16   [name], MD  oxyCODONE-acetaminophen (PERCOCET) 5-325 MG tablet Take 2 tablets by mouth every 6 (six) hours as needed for moderate pain or severe pain. 11/17/16   Gouru, Illene Silver, MD  polyethylene glycol (MIRALAX / GLYCOLAX) packet Take 17 g by mouth daily as needed for mild constipation. 11/17/16   Gouru,  Illene Silver, MD  rOPINIRole (REQUIP) 0.25 MG tablet Take 0.25 mg by mouth at bedtime as needed.  05/07/16 05/07/17  [provider]      VITAL SIGNS:  Blood pressure 117/72, pulse 65, temperature (!) 97.5 F (36.4 C), temperature source Axillary, resp. rate (!) 25, height 6' 2"  (1.88 m), weight 90.7 kg (200 lb), SpO2 98 %.  PHYSICAL EXAMINATION:   Physical Exam  Constitutional: He is oriented to person, place, and time and well-developed, well-nourished, and in no distress. No distress.  HENT:  Head: Normocephalic.  Eyes: No scleral icterus.  Neck: Normal range of motion. Neck supple. No JVD present. No tracheal deviation present.  Cardiovascular: Normal rate, regular rhythm and normal heart sounds.  Exam reveals no gallop and no friction rub.   No  murmur heard. Pulmonary/Chest: Effort normal and breath sounds normal. No respiratory distress. He has no wheezes. He has no rales. He exhibits no tenderness.  Abdominal: Soft. Bowel sounds are normal. He exhibits no distension and no mass. There is no tenderness. There is no rebound and no guarding.  Musculoskeletal: Normal range of motion. He exhibits no edema.  Neurological: He is alert and oriented to person, place, and time.  Skin: Skin is warm. No erythema.  His left foot is bandaged up  Psychiatric: Affect and judgment normal.      LABORATORY PANEL:   CBC  Recent Labs Lab 12/18/16 0935  WBC 7.2  HGB 12.9*  HCT 37.7*  PLT 143*   ------------------------------------------------------------------------------------------------------------------  Chemistries   Recent Labs Lab 12/18/16 0935  NA 139  K 4.0  CL 108  CO2 24  GLUCOSE 120*  BUN 19  CREATININE 0.97  CALCIUM 8.8*  AST 18  ALT 13*  ALKPHOS 120  BILITOT 1.0   ------------------------------------------------------------------------------------------------------------------  Cardiac Enzymes  Recent Labs Lab 12/18/16 0935  TROPONINI <0.03    ------------------------------------------------------------------------------------------------------------------  RADIOLOGY:  Ct Head Wo Contrast  Result Date: 12/18/2016 CLINICAL DATA:  Confusion. EXAM: CT HEAD WITHOUT CONTRAST TECHNIQUE: Contiguous axial images were obtained from the base of the skull through the vertex without intravenous contrast. COMPARISON:  CT scan of October 03, 2011. FINDINGS: Brain: No evidence of acute infarction, hemorrhage, hydrocephalus, extra-axial collection or mass lesion/mass effect. Vascular: No hyperdense vessel or unexpected calcification. Skull: Normal. Negative for fracture or focal lesion. Sinuses/Orbits: No acute finding. Other: None. IMPRESSION: Normal head CT. Electronically Signed   By: Marijo Conception, M.D.   On: 12/18/2016 15:05    EKG:   Sinus tachycardia no ST elevation or depression  IMPRESSION AND PLAN:   76 year old male with chronic atrial fibrillation , essential hypertension and chronic diastolic heart failure who was hospitalized about a month ago for diabetic foot ulcer who presents today with nausea, vomiting and diarrhea.  1.  Nausea, vomiting and diarrhea: This is likely gastroenteritis Continue supportive management with as needed antiemetics and IV fluids Check GI panel   2.  Recent diabetic foot ulcer status post incision and drainage : Podiatry consultation requested  3.  Generalized weakness due to problem #1 Physical therapy consultation requested   4.  Chronic atrial fibrillation currently in normal sinus rhythm Continue aspirin  5.  Diabetes, diet controlled: Sliding scale and ADA diet   6.  Chronic diastolic heart failure without evidence of Exacerbation  :   All the records are reviewed and case discussed with ED provider. Management plans discussed with the patient and family and he is in agreement  CODE STATUS: FULL  TOTAL TIME TAK sinus tachycardia no ST elevation or depression ING CARE OF THIS  PATIENT: 43 minutes.    Jasmarie Coppock M.D on 12/18/2016 at 3:45 PM  Between 7am to 6pm - Pager - [phone]  After 6pm go to [email or web address] - password [email or web address] [facility]  Office  [phone]  CC: Primary care physician; Sofie Hartigan, MD

## 2016-12-18 NOTE — ED Provider Notes (Signed)
[facility] Emergency Department Provider Note ____________________________________________   I have reviewed the triage vital signs and the triage nursing note.  HISTORY  Chief Complaint Abdominal Pain and Altered Mental Status   Historian Patient  HPI Jeremy Herrera is a 76 y.o. male brought in by his daughter who has been caring for him over the past week or so after he had surgery to his foot because of retained acorn cap.  They state he is typically pretty active and is having great days.  Last night around 11 PM he started vomiting and vomited all night long.  Nothing black or bloody.  He had numerous episodes of watery diarrhea, nothing black or bloody.  No fever.  He had some mild abdominal cramping.  Now he just feels extremely fatigued.  He has a mild generalized headache.  Family thought he may be hallucinating this morning, seems a little bit better right now.  Denies urinary symptoms.   Past Medical History:  Diagnosis Date  . Anemia   . BPH (benign prostatic hyperplasia)   . CHF (congestive heart failure) ([place])   . Dysphagia   . GERD (gastroesophageal reflux disease)   . HLD (hyperlipidemia)   . HTN (hypertension)   . Irregular heart beat   . Nocturia   . Urinary retention     Patient Active Problem List   Diagnosis Date Noted  . Diabetic foot ulcer ([place]) 11/13/2016  . Nocturia 10/17/2014  . History of urinary retention 10/17/2014  . 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  . B12 deficiency 09/30/2014  . Right leg swelling 09/30/2014  . Anemia 09/16/2014  . Thrombocytopenia ([place]) 09/16/2014  . Benign essential HTN 06/08/2014  . 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 Beat, premature ventricular 12/03/2013  . Heart valve disease 12/03/2013  . 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 Diabetes mellitus, type 2 ([place]) 11/20/2013  . Acid reflux 11/20/2013  . Anemia, iron deficiency  11/20/2013  . Abnormal presence of protein in urine 11/20/2013  . 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 bladder patching  1964  . HIP SURGERY Right 2014  . IRRIGATION AND DEBRIDEMENT FOOT Left 11/14/2016   Procedure: IRRIGATION AND DEBRIDEMENT FOOT;  Surgeon: Samara Deist, DPM;  Location: [facility] ORS;  Service: Podiatry;  Laterality: Left;  . TRANSURETHRAL RESECTION OF PROSTATE      Prior to Admission medications   Medication Sig Start Date End Date Taking? Authorizing Provider  acetaminophen (TYLENOL) 500 MG tablet Take by mouth.    [provider]  amoxicillin-clavulanate (AUGMENTIN) 875-125 MG tablet Take 1 tablet by mouth every 12 (twelve) hours. 11/17/16   [name], MD  aspirin 81 MG tablet Take 81 mg by mouth daily.    [provider]  bimatoprost (LUMIGAN) 0.01 % SOLN Place 1 drop into both eyes 2 (two) times daily.     [provider]  ciprofloxacin (CIPRO) 500 MG tablet Take 1 tablet (500 mg total) by mouth 2 (two) times daily. 11/17/16   Gouru, Illene Silver, MD  Cyanocobalamin (B-12) 1000 MCG/ML KIT Inject 1 mL as directed every 30 (thirty) days.    [provider]  cyclobenzaprine (FLEXERIL) 10 MG tablet Take 1 tablet (10 mg total) by mouth 3 (three) times daily as needed for muscle spasms. 04/12/16   Earleen Newport, MD  dorzolamide-timolol (COSOPT) 22.3-6.8 MG/ML ophthalmic solution Place 1 drop  into both eyes 2 (two) times daily.     [provider]  dutasteride (AVODART) 0.5 MG capsule Take 1 capsule (0.5 mg total) by mouth daily. 07/08/15   Zara Council A, PA-C  esomeprazole (NEXIUM) 20 MG capsule Take 20 mg by mouth daily at 12 noon.    [provider]  ferrous sulfate 325 (65 FE) MG tablet Take 325 mg by mouth daily.    [provider]  furosemide (LASIX) 20 MG tablet Take 20 mg by mouth daily as needed for fluid. Try to  limit to no more than 3-4 times per week. 11/23/14   [provider]  gabapentin (NEURONTIN) 100 MG capsule Take 1 capsule (100 mg total) by mouth 3 (three) times daily. 11/17/16   Gouru, Illene Silver, MD  insulin aspart (NOVOLOG) 100 UNIT/ML injection Inject 0-9 Units into the skin 3 (three) times daily with meals. 11/17/16   [name], MD  mupirocin ointment (BACTROBAN) 2 % Apply 1 application topically 3 (three) times daily. Patient not taking: Reported on 11/13/2016 05/05/16   Crecencio Mc P, PA-C  ondansetron (ZOFRAN) 4 MG tablet Take 1 tablet (4 mg total) by mouth every 6 (six) hours as needed for nausea. 11/17/16   [name], MD  oxyCODONE-acetaminophen (PERCOCET) 5-325 MG tablet Take 2 tablets by mouth every 6 (six) hours as needed for moderate pain or severe pain. 11/17/16   Gouru, Illene Silver, MD  polyethylene glycol (MIRALAX / GLYCOLAX) packet Take 17 g by mouth daily as needed for mild constipation. 11/17/16   Gouru, Illene Silver, MD  rOPINIRole (REQUIP) 0.25 MG tablet Take 0.25 mg by mouth at bedtime as needed.  05/07/16 05/07/17  [provider]    Allergies  Allergen Reactions  . Hydrocodone-Acetaminophen Nausea And Vomiting    Family History  Problem Relation Age of Onset  . Ulcers Mother        stomach  . GER disease Sister        GERD  . Heart attack Father   . Diabetes Brother   . Depression Brother   . Kidney disease Neg Hx   . Prostate cancer Neg Hx   . Kidney cancer Neg Hx   . Bladder Cancer Neg Hx     Social History Social History  Substance Use Topics  . Smoking status: Never Smoker  . Smokeless tobacco: Never Used  . Alcohol use No    Review of Systems  Constitutional: Negative for fever. Eyes: Negative for visual changes. ENT: Negative for sore throat. Cardiovascular: Negative for chest pain. Respiratory: Negative for shortness of breath. Gastrointestinal: Positive as per HPI for vomiting and diarrhea. Genitourinary: Negative for  dysuria. Musculoskeletal: Negative for back pain. Skin: Negative for rash. Neurological: Negative for headache.  ____________________________________________   PHYSICAL EXAM:  VITAL SIGNS: ED Triage Vitals  Enc Vitals Group     BP --      Pulse Rate 12/18/16 0923 100     Resp 12/18/16 0923 18     Temp 12/18/16 0923 (!) 97.5 F (36.4 C)     Temp Source 12/18/16 0923 Axillary     SpO2 12/18/16 0923 98 %     Weight 12/18/16 0924 200 lb (90.7 kg)     Height 12/18/16 0924 6' 2"  (1.88 m)     Head Circumference --      Peak Flow --      Pain Score --      Pain Loc --      Pain Edu? --  Excl. in [place]? --      Constitutional: Alert and oriented. Well appearing and in no distress. HEENT   Head: Normocephalic and atraumatic.      Eyes: Conjunctivae are normal. Pupils equal and round.       Ears:         Nose: No congestion/rhinnorhea.   Mouth/Throat: Mucous membranes are mildly dry.   Neck: No stridor. Cardiovascular/Chest: Normal rate, regular rhythm.  No murmurs, rubs, or gallops. Respiratory: Normal respiratory effort without tachypnea nor retractions. Breath sounds are clear and equal bilaterally. No wheezes/rales/rhonchi. Gastrointestinal: Soft. No distention, no guarding, no rebound. Nontender.    Genitourinary/rectal:Deferred Musculoskeletal: Nontender with normal range of motion in all extremities. No joint effusions.  No lower extremity tenderness.  No edema. Neurologic:  Normal speech and language. No gross or focal neurologic deficits are appreciated. Skin:  Skin is warm, dry and intact. No rash noted. Psychiatric: Mood and affect are normal. Speech and behavior are normal. Patient exhibits appropriate insight and judgment.   ____________________________________________  LABS (pertinent positives/negatives) I, Lisa Roca, MD the attending physician have reviewed the labs noted below.  Labs Reviewed  COMPREHENSIVE METABOLIC PANEL - Abnormal; Notable  for the following:       Result Value   Glucose, Bld 120 (*)    Calcium 8.8 (*)    ALT 13 (*)    All other components within normal limits  CBC - Abnormal; Notable for the following:    RBC 4.15 (*)    Hemoglobin 12.9 (*)    HCT 37.7 (*)    Platelets 143 (*)    All other components within normal limits  URINALYSIS, COMPLETE (UACMP) WITH MICROSCOPIC - Abnormal; Notable for the following:    Color, Urine YELLOW (*)    APPearance CLEAR (*)    Squamous Epithelial / LPF 0-5 (*)    All other components within normal limits  LIPASE, BLOOD  LACTIC ACID, PLASMA  TROPONIN I    ____________________________________________    EKG I, Lisa Roca, MD, the attending physician have personally viewed and interpreted all ECGs.  104 bpm.  Sinus tachycardia.  Occasional PVC.  Narrow QRS renal axis.  Nonspecific ST and T wave ____________________________________________  RADIOLOGY All Xrays were viewed by me.  Imaging interpreted by Radiologist, and I, Lisa Roca, MD the attending physician have reviewed the radiologist interpretation noted below.  CT head without contrast:  IMPRESSION: Normal head CT. __________________________________________  PROCEDURES  Procedure(s) performed: None  Critical Care performed: None  ____________________________________________  No current facility-administered medications on file prior to encounter.    Current Outpatient Prescriptions on File Prior to Encounter  Medication Sig Dispense Refill  . acetaminophen (TYLENOL) 500 MG tablet Take by mouth.    Marland Kitchen amoxicillin-clavulanate (AUGMENTIN) 875-125 MG tablet Take 1 tablet by mouth every 12 (twelve) hours. 28 tablet 0  . aspirin 81 MG tablet Take 81 mg by mouth daily.    . bimatoprost (LUMIGAN) 0.01 % SOLN Place 1 drop into both eyes 2 (two) times daily.     . ciprofloxacin (CIPRO) 500 MG tablet Take 1 tablet (500 mg total) by mouth 2 (two) times daily. 28 tablet 0  . Cyanocobalamin (B-12) 1000  MCG/ML KIT Inject 1 mL as directed every 30 (thirty) days.    . cyclobenzaprine (FLEXERIL) 10 MG tablet Take 1 tablet (10 mg total) by mouth 3 (three) times daily as needed for muscle spasms. 30 tablet 0  . dorzolamide-timolol (COSOPT) 22.3-6.8 MG/ML ophthalmic solution Place  1 drop into both eyes 2 (two) times daily.     Marland Kitchen dutasteride (AVODART) 0.5 MG capsule Take 1 capsule (0.5 mg total) by mouth daily. 30 capsule 12  . esomeprazole (NEXIUM) 20 MG capsule Take 20 mg by mouth daily at 12 noon.    . ferrous sulfate 325 (65 FE) MG tablet Take 325 mg by mouth daily.    . furosemide (LASIX) 20 MG tablet Take 20 mg by mouth daily as needed for fluid. Try to limit to no more than 3-4 times per week.    . gabapentin (NEURONTIN) 100 MG capsule Take 1 capsule (100 mg total) by mouth 3 (three) times daily.    . insulin aspart (NOVOLOG) 100 UNIT/ML injection Inject 0-9 Units into the skin 3 (three) times daily with meals. 10 mL 11  . mupirocin ointment (BACTROBAN) 2 % Apply 1 application topically 3 (three) times daily. (Patient not taking: Reported on 11/13/2016) 22 g 0  . ondansetron (ZOFRAN) 4 MG tablet Take 1 tablet (4 mg total) by mouth every 6 (six) hours as needed for nausea. 20 tablet 0  . oxyCODONE-acetaminophen (PERCOCET) 5-325 MG tablet Take 2 tablets by mouth every 6 (six) hours as needed for moderate pain or severe pain. 20 tablet 0  . polyethylene glycol (MIRALAX / GLYCOLAX) packet Take 17 g by mouth daily as needed for mild constipation. 14 each 0  . rOPINIRole (REQUIP) 0.25 MG tablet Take 0.25 mg by mouth at bedtime as needed.       ____________________________________________  ED COURSE / ASSESSMENT AND PLAN  Pertinent labs & imaging results that were available during my care of the patient were reviewed by me and considered in my medical decision making (see chart for details).   Mr. Pendry is here for vomiting diarrhea over the last 12 hours.  Vital signs are stable, the patient does  look clinically dry.  Going to treat symptomatically with immodium, zofran, tylenl and ivf bolus.  Overall abdomen is soft.  Laboratory studies are reassuring without electrolyte disturbance, acute renal failure, abnormal white blood cell count or hemoglobin.  Urinalysis negative for urinary tract infection.  Heart rate 104 after first liter normal saline, will give second liter normal saline.  Family still feels like patient is confused.  I am and add on head CT to make sure no intracranial findings there.  CT head is reassuring.  I went back to again speak with the patient as well as the daughter, and although the patient has had not had any additional bowel movement here, he still feels extremely fatigued, and really can hardly even sit himself up in bed let alone stand.  He is minimal weightbearing to the left foot due to the recent surgery they are, and he is not going to be steady on his feet or safe at home in this condition.    Daughter does not feel like she can care for him in this condition right now.  He may need additional hydration.  His heart rate is anywhere between 99-105 after 2 L of normal saline.  I am can add on blood cultures.  If he has a stool sample, stool be sent as well.   CONSULTATIONS:   Hospitalist for admission, Dr. Benjie Karvonen.   Patient / Family / Caregiver informed of clinical course, medical decision-making process, and agree with plan.   ___________________________________________   FINAL CLINICAL IMPRESSION(S) / ED DIAGNOSES   Final diagnoses:  Vomiting and diarrhea  Note: This dictation was prepared with Dragon dictation. Any transcriptional errors that result from this process are unintentional    Lisa Roca, MD 12/18/16 1538

## 2016-12-18 NOTE — ED Triage Notes (Signed)
Pt in via [facility] with complaints of new onset N/VD w/ generalized abdominal pain since last night.  Pt has lost control of bowels upon arrival.  Per EMS, pt with some intermittent confusion.  Pt given NaCl bolus via EMS.  Pt A/Ox4, NAD noted at this time.

## 2016-12-19 ENCOUNTER — Observation Stay: Payer: Medicare PPO

## 2016-12-19 LAB — BASIC METABOLIC PANEL
ANION GAP: 4 — AB (ref 5–15)
BUN: 12 mg/dL (ref 6–20)
CALCIUM: 8.4 mg/dL — AB (ref 8.9–10.3)
CO2: 25 mmol/L (ref 22–32)
CREATININE: 1.07 mg/dL (ref 0.61–1.24)
Chloride: 111 mmol/L (ref 101–111)
Glucose, Bld: 111 mg/dL — ABNORMAL HIGH (ref 65–99)
Potassium: 4.4 mmol/L (ref 3.5–5.1)
SODIUM: 140 mmol/L (ref 135–145)

## 2016-12-19 LAB — CBC
HCT: 36.1 % — ABNORMAL LOW (ref 40.0–52.0)
HEMOGLOBIN: 12.5 g/dL — AB (ref 13.0–18.0)
MCH: 31.6 pg (ref 26.0–34.0)
MCHC: 34.5 g/dL (ref 32.0–36.0)
MCV: 91.7 fL (ref 80.0–100.0)
PLATELETS: 128 10*3/uL — AB (ref 150–440)
RBC: 3.94 MIL/uL — AB (ref 4.40–5.90)
RDW: 13.8 % (ref 11.5–14.5)
WBC: 7.4 10*3/uL (ref 3.8–10.6)

## 2016-12-19 LAB — GLUCOSE, CAPILLARY
GLUCOSE-CAPILLARY: 97 mg/dL (ref 65–99)
GLUCOSE-CAPILLARY: 105 mg/dL — AB (ref 65–99)

## 2016-12-19 MED ORDER — ADULT MULTIVITAMIN LIQUID CH
15.0000 mL | Freq: Every day | ORAL | Status: DC
  Administered 2016-12-19: 15 mL via ORAL
  Filled 2016-12-19: qty 15

## 2016-12-19 MED ORDER — GABAPENTIN 100 MG PO CAPS
100.0000 mg | ORAL_CAPSULE | Freq: Three times a day (TID) | ORAL | Status: DC
  Administered 2016-12-19: 100 mg via ORAL
  Filled 2016-12-19: qty 1

## 2016-12-19 MED ORDER — ENSURE ENLIVE PO LIQD
237.0000 mL | Freq: Two times a day (BID) | ORAL | Status: DC
  Administered 2016-12-19: 237 mL via ORAL

## 2016-12-19 MED ORDER — ASPIRIN EC 81 MG PO TBEC
81.0000 mg | DELAYED_RELEASE_TABLET | Freq: Every day | ORAL | Status: DC
  Administered 2016-12-19: 81 mg via ORAL
  Filled 2016-12-19: qty 1

## 2016-12-19 NOTE — Progress Notes (Signed)
Initial Nutrition Assessment  DOCUMENTATION CODES:   Not applicable  INTERVENTION:   Ensure Enlive po BID, each supplement provides 350 kcal and 20 grams of protein  MVI daily   Dysphagia 3 diet   NUTRITION DIAGNOSIS:   Increased nutrient needs related to wound healing as evidenced by increased estimated needs from protein.  GOAL:   Patient will meet greater than or equal to 90% of their needs  MONITOR:   PO intake, Supplement acceptance, Labs, Weight trends, Skin  REASON FOR ASSESSMENT:   Malnutrition Screening Tool    ASSESSMENT:   76 y.o. male with a known history of chronic atrial fibrillation, diabetes, chronic diastolic heart failure and recent admission to the hospital due to diabetic foot ulcer who presents to the emergency with nausea, vomiting, and diarrhea    Met with pt in room today. Pt reports good appetite and oral intake up until 2 days pta r/t nausea and vomiting. Pt ate 100% of his breakfast this morning that included cereal and eggs. Pt denies any nausea or vomiting today; he reports that he is feeling much better. Pt does have a history of dysphagia; pt has been evaluated by SLP in the past (s/p MBS) and recommend for dysphagia 3/thin liquid diet. Pt also has GERD and a history of esophageal dilation ~10 years ago. Per chart, pt has lost 28lbs(12%) over the past 10 months; this is not significant given time frame. Pt with diabetic foot ulcer. Discussed with pt the importance of adequate protein intake needed for wound healing. Pt does not drink supplements at home but is willing to try Ensure. Also recommend daily MVI. Pt to possibly discharge today; recommend outpatient SLP evaluation.   Medications reviewed and include: aspirin, lovenox, insulin, NaCl @100ml /hr  Labs reviewed: Ca 8.4(L)  Nutrition-Focused physical exam completed. Findings are no fat depletion, moderate muscle depletions in temporal regions, clavicles, and hands, and no edema.   Diet  Order:  Diet - low sodium heart healthy DIET DYS 3 Room service appropriate? Yes; Fluid consistency: Thin  EDUCATION NEEDS:   Education needs have been addressed  Skin:  Skin Assessment: Reviewed RN Assessment  Last BM:  10/29  Height:   Ht Readings from Last 1 Encounters:  12/18/16 6' 2"  (1.88 m)    Weight:   Wt Readings from Last 1 Encounters:  12/18/16 200 lb (90.7 kg)    Ideal Body Weight:  86.3 kg  BMI:  Body mass index is 25.68 kg/m.  Estimated Nutritional Needs:   Kcal:  2300-2600kcal/day   Protein:  109-118g/day   Fluid:  >2.3L/day   Koleen Distance MS, RD, LDN Pager #[phone] After Hours Pager: [phone]

## 2016-12-19 NOTE — Care Management (Signed)
Patient to discharge home today with resumption of home health order PT and RN.  [place] [facility].  Patient has follow up appointment with podiatry tomorrow.

## 2016-12-19 NOTE — Consult Note (Signed)
Reason for Consult: Ulceration on his left foot with history of previous surgery Referring Physician: Mody  Jeremy Herrera is an 76 y.o. male.  HPI: This is a 76 year old male with a history of diet-controlled diabetes who had an ulceration with abscess on the bottom of his left foot last month. He did have surgery for an I&D performed by Dr. Vickki Muff. He wasn't skilled nursing for a while and now is at home with home health care. Recently admitted because of some significant nausea and vomiting with dehydration.  Past Medical History:  Diagnosis Date  . Anemia   . BPH (benign prostatic hyperplasia)   . CHF (congestive heart failure) ([place])   . Dysphagia   . GERD (gastroesophageal reflux disease)   . HLD (hyperlipidemia)   . HTN (hypertension)   . Irregular heart beat   . Nocturia   . Urinary retention     Past Surgical History:  Procedure Laterality Date  . bladder patching  1964  . HIP SURGERY Right 2014  . IRRIGATION AND DEBRIDEMENT FOOT Left 11/14/2016   Procedure: IRRIGATION AND DEBRIDEMENT FOOT;  Surgeon: Samara Deist, DPM;  Location: [facility] ORS;  Service: Podiatry;  Laterality: Left;  . TRANSURETHRAL RESECTION OF PROSTATE      Family History  Problem Relation Age of Onset  . Ulcers Mother        stomach  . GER disease Sister        GERD  . Heart attack Father   . Diabetes Brother   . Depression Brother   . Kidney disease Neg Hx   . Prostate cancer Neg Hx   . Kidney cancer Neg Hx   . Bladder Cancer Neg Hx     Social History:  reports that he has never smoked. He has never used smokeless tobacco. He reports that he does not drink alcohol or use drugs.  Allergies:  Allergies  Allergen Reactions  . Hydrocodone-Acetaminophen Nausea And Vomiting    Medications:  Scheduled: . aspirin EC  81 mg Oral Daily  . enoxaparin (LOVENOX) injection  40 mg Subcutaneous Q24H  . feeding supplement (ENSURE ENLIVE)  237 mL Oral BID BM  . gabapentin  100 mg Oral TID  . insulin  aspart  0-9 Units Subcutaneous TID WC  . multivitamin  15 mL Oral Daily    Results for orders placed or performed during the hospital encounter of 12/18/16 (from the past 48 hour(s))  Lipase, blood     Status: None   Collection Time: 12/18/16  9:35 AM  Result Value Ref Range   Lipase 30 11 - 51 U/L  Comprehensive metabolic panel     Status: Abnormal   Collection Time: 12/18/16  9:35 AM  Result Value Ref Range   Sodium 139 135 - 145 mmol/L   Potassium 4.0 3.5 - 5.1 mmol/L   Chloride 108 101 - 111 mmol/L   CO2 24 22 - 32 mmol/L   Glucose, Bld 120 (H) 65 - 99 mg/dL   BUN 19 6 - 20 mg/dL   Creatinine, Ser 0.97 0.61 - 1.24 mg/dL   Calcium 8.8 (L) 8.9 - 10.3 mg/dL   Total Protein 6.9 6.5 - 8.1 g/dL   Albumin 3.7 3.5 - 5.0 g/dL   AST 18 15 - 41 U/L   ALT 13 (L) 17 - 63 U/L   Alkaline Phosphatase 120 38 - 126 U/L   Total Bilirubin 1.0 0.3 - 1.2 mg/dL   GFR calc non Af Amer >60 >60  mL/min   GFR calc Af Amer >60 >60 mL/min    Comment: (NOTE) The eGFR has been calculated using the CKD EPI equation. This calculation has not been validated in all clinical situations. eGFR's persistently <60 mL/min signify possible Chronic Kidney Disease.    Anion gap 7 5 - 15  CBC     Status: Abnormal   Collection Time: 12/18/16  9:35 AM  Result Value Ref Range   WBC 7.2 3.8 - 10.6 K/uL   RBC 4.15 (L) 4.40 - 5.90 MIL/uL   Hemoglobin 12.9 (L) 13.0 - 18.0 g/dL   HCT 37.7 (L) 40.0 - 52.0 %   MCV 90.8 80.0 - 100.0 fL   MCH 31.2 26.0 - 34.0 pg   MCHC 34.4 32.0 - 36.0 g/dL   RDW 13.2 11.5 - 14.5 %   Platelets 143 (L) 150 - 440 K/uL  Urinalysis, Complete w Microscopic     Status: Abnormal   Collection Time: 12/18/16  9:35 AM  Result Value Ref Range   Color, Urine YELLOW (A) YELLOW   APPearance CLEAR (A) CLEAR   Specific Gravity, Urine 1.016 1.005 - 1.030   pH 6.0 5.0 - 8.0   Glucose, UA NEGATIVE NEGATIVE mg/dL   Hgb urine dipstick NEGATIVE NEGATIVE   Bilirubin Urine NEGATIVE NEGATIVE   Ketones,  ur NEGATIVE NEGATIVE mg/dL   Protein, ur NEGATIVE NEGATIVE mg/dL   Nitrite NEGATIVE NEGATIVE   Leukocytes, UA NEGATIVE NEGATIVE   RBC / HPF 0-5 0 - 5 RBC/hpf   WBC, UA 0-5 0 - 5 WBC/hpf   Bacteria, UA NONE SEEN NONE SEEN   Squamous Epithelial / LPF 0-5 (A) NONE SEEN   Mucus PRESENT   Lactic acid, plasma     Status: None   Collection Time: 12/18/16  9:35 AM  Result Value Ref Range   Lactic Acid, Venous 1.3 0.5 - 1.9 mmol/L  Troponin I     Status: None   Collection Time: 12/18/16  9:35 AM  Result Value Ref Range   Troponin I <0.03 <0.03 ng/mL  Culture, blood (routine x 2)     Status: None (Preliminary result)   Collection Time: 12/18/16  5:18 PM  Result Value Ref Range   Specimen Description BLOOD LEFT HAND    Special Requests      BOTTLES DRAWN AEROBIC AND ANAEROBIC Blood Culture results may not be optimal due to an inadequate volume of blood received in culture bottles   Culture NO GROWTH < 24 HOURS    Report Status PENDING   Culture, blood (routine x 2)     Status: None (Preliminary result)   Collection Time: 12/18/16  5:26 PM  Result Value Ref Range   Specimen Description BLOOD RESISTANT HAND    Special Requests      BOTTLES DRAWN AEROBIC AND ANAEROBIC Blood Culture adequate volume   Culture NO GROWTH < 24 HOURS    Report Status PENDING   Glucose, capillary     Status: Abnormal   Collection Time: 12/18/16  9:39 PM  Result Value Ref Range   Glucose-Capillary 106 (H) 65 - 99 mg/dL  Basic metabolic panel     Status: Abnormal   Collection Time: 12/19/16  5:09 AM  Result Value Ref Range   Sodium 140 135 - 145 mmol/L   Potassium 4.4 3.5 - 5.1 mmol/L   Chloride 111 101 - 111 mmol/L   CO2 25 22 - 32 mmol/L   Glucose, Bld 111 (H) 65 - 99 mg/dL  BUN 12 6 - 20 mg/dL   Creatinine, Ser 1.07 0.61 - 1.24 mg/dL   Calcium 8.4 (L) 8.9 - 10.3 mg/dL   GFR calc non Af Amer >60 >60 mL/min   GFR calc Af Amer >60 >60 mL/min    Comment: (NOTE) The eGFR has been calculated using the CKD  EPI equation. This calculation has not been validated in all clinical situations. eGFR's persistently <60 mL/min signify possible Chronic Kidney Disease.    Anion gap 4 (L) 5 - 15  CBC     Status: Abnormal   Collection Time: 12/19/16  5:09 AM  Result Value Ref Range   WBC 7.4 3.8 - 10.6 K/uL   RBC 3.94 (L) 4.40 - 5.90 MIL/uL   Hemoglobin 12.5 (L) 13.0 - 18.0 g/dL   HCT 36.1 (L) 40.0 - 52.0 %   MCV 91.7 80.0 - 100.0 fL   MCH 31.6 26.0 - 34.0 pg   MCHC 34.5 32.0 - 36.0 g/dL   RDW 13.8 11.5 - 14.5 %   Platelets 128 (L) 150 - 440 K/uL  Glucose, capillary     Status: None   Collection Time: 12/19/16  7:29 AM  Result Value Ref Range   Glucose-Capillary 97 65 - 99 mg/dL  Glucose, capillary     Status: Abnormal   Collection Time: 12/19/16 11:29 AM  Result Value Ref Range   Glucose-Capillary 105 (H) 65 - 99 mg/dL    Ct Head Wo Contrast  Result Date: 12/18/2016 CLINICAL DATA:  Confusion. EXAM: CT HEAD WITHOUT CONTRAST TECHNIQUE: Contiguous axial images were obtained from the base of the skull through the vertex without intravenous contrast. COMPARISON:  CT scan of October 03, 2011. FINDINGS: Brain: No evidence of acute infarction, hemorrhage, hydrocephalus, extra-axial collection or mass lesion/mass effect. Vascular: No hyperdense vessel or unexpected calcification. Skull: Normal. Negative for fracture or focal lesion. Sinuses/Orbits: No acute finding. Other: None. IMPRESSION: Normal head CT. Electronically Signed   By: Marijo Conception, M.D.   On: 12/18/2016 15:05   Dg Foot Complete Left  Result Date: 12/19/2016 CLINICAL DATA:  Malodorous left foot with a skin ulceration present. EXAM: LEFT FOOT - COMPLETE 3+ VIEW COMPARISON:  MRI left foot 11/13/2016. FINDINGS: Small foci of gas seen in the plantar soft tissues on the lateral view at the level of the MTP joints and bases of the toes worrisome for skin ulcerations. No radiopaque foreign body is identified. No bony destructive change or  periosteal reaction. No fracture or dislocation. IMPRESSION: Skin ulcerations without evidence of osteomyelitis. Electronically Signed   By: Inge Rise M.D.   On: 12/19/2016 09:18    Review of Systems  Constitutional: Negative for chills and fever.  HENT: Negative.   Eyes: Negative.   Respiratory: Negative.   Cardiovascular: Negative.   Gastrointestinal: Negative for nausea and vomiting.  Genitourinary: Negative.   Musculoskeletal:       Relates some pain in his left forefoot  Skin:       Patient relates a sore on the bottom of his left foot that is been going on for a month or more. Previously had some surgery last month with Dr. Vickki Muff  Neurological: Negative.   Endo/Heme/Allergies: Negative.   Psychiatric/Behavioral: Negative.    Blood pressure 138/73, pulse 77, temperature 98.3 F (36.8 C), temperature source Oral, resp. rate 18, height 6' 2"  (1.88 m), weight 90.7 kg (200 lb), SpO2 99 %. Physical Exam  Cardiovascular:  DP and PT pulses are palpable.  Musculoskeletal:  Some hammertoe contractures on the lesser toes with some prominent metatarsal heads plantarly. Some pain on palpation around the left forefoot and metatarsophalangeal joints plantarly.  Neurological:  Loss of protective threshold with a monofilament wire in the toes bilateral. Proprioception is impaired in the digits.  Skin:  The skin is warm dry and somewhat atrophic. Some edema is noted in the left foot as compared to the right. A full-thickness ulceration is present on the plantar aspect of the left third metatarsal region. Some surrounding devitalized tissue and this does probe down to a level of about 7-8 mm. No evidence for tunneling beneath the skin edges. No purulence is expressed.    Assessment/Plan: Assessment: Full-thickness ulceration left foot  Plan: Excisional debridement of devitalized tissue from the wound on the plantar aspect of the left foot sharply using AP of tissue nippers and including  some of the deeper subcutaneous tissue. A sterile saline wet-to-dry gauze was then packed into the wound and covered with a bulky bandage and Kerlix. He will continue with his home health. He has an appointment with Dr. Vickki Muff for tomorrow and he will keep this for follow-up.  Jeremy Herrera 12/19/2016, 2:18 PM

## 2016-12-19 NOTE — Discharge Summary (Signed)
[place] [facility] [place] NAME: Jeremy Herrera    MR#:  [id number]  DATE OF BIRTH:  10-14-1940  DATE OF ADMISSION:  12/18/2016   ADMITTING PHYSICIAN: Bettey Costa, MD  DATE OF DISCHARGE: 12/19/2016 PRIMARY CARE PHYSICIAN: Sofie Hartigan, MD   ADMISSION DIAGNOSIS:  Vomiting and diarrhea [R11.10, R19.7] DISCHARGE DIAGNOSIS:  Active Problems:   Nausea  SECONDARY DIAGNOSIS:   Past Medical History:  Diagnosis Date  . Anemia   . BPH (benign prostatic hyperplasia)   . CHF (congestive heart failure) ([place])   . Dysphagia   . GERD (gastroesophageal reflux disease)   . HLD (hyperlipidemia)   . HTN (hypertension)   . Irregular heart beat   . Nocturia   . Urinary retention    HOSPITAL COURSE:   76 year old male with chronic atrial fibrillation , essential hypertension and chronic diastolic heart failure who was hospitalized about a month ago for diabetic foot ulcer who presents today with nausea, vomiting and diarrhea.  1.  Nausea, vomiting and diarrhea: This is likely gastroenteritis He is treated with supportive management with as needed antiemetics and IV fluids The patient had no bowel movement after admission, unable to send GM panel test. He hasn't no nausea or vomiting, only. Abdominal sore.   2.  Recent diabetic foot ulcer status post incision and drainage : Follow-up podiatrist as outpatient.  3.  Generalized weakness due to problem #1 Physical therapy consultation: home health and PT.  4.  Chronic atrial fibrillation currently in normal sinus rhythm Continue aspirin  5.  Diabetes, diet controlled: Sliding scale and ADA diet   6.  Chronic diastolic heart failure without evidence of Exacerbation  DISCHARGE CONDITIONS:  Clinically stable, discharge to home with home health and PT today. CONSULTS OBTAINED:  Treatment Team:  Sharlotte Alamo, DPM DRUG ALLERGIES:   Allergies  Allergen Reactions  .  Hydrocodone-Acetaminophen Nausea And Vomiting   DISCHARGE MEDICATIONS:   Allergies as of 12/19/2016      Reactions   Hydrocodone-acetaminophen Nausea And Vomiting      Medication List    STOP taking these medications   amoxicillin-clavulanate 875-125 MG tablet Commonly known as:  AUGMENTIN   ciprofloxacin 500 MG tablet Commonly known as:  CIPRO   dutasteride 0.5 MG capsule Commonly known as:  AVODART   insulin aspart 100 UNIT/ML injection Commonly known as:  novoLOG   mupirocin ointment 2 % Commonly known as:  BACTROBAN   ondansetron 4 MG tablet Commonly known as:  ZOFRAN   oxyCODONE-acetaminophen 5-325 MG tablet Commonly known as:  PERCOCET   polyethylene glycol packet Commonly known as:  MIRALAX / GLYCOLAX     TAKE these medications   acetaminophen 500 MG tablet Commonly known as:  TYLENOL Take by mouth.   aspirin 81 MG tablet Take 81 mg by mouth daily.   B-12 1000 MCG/ML Kit Inject 1 mL as directed every 30 (thirty) days.   bimatoprost 0.01 % Soln Commonly known as:  LUMIGAN Place 1 drop into both eyes 2 (two) times daily.   cyclobenzaprine 10 MG tablet Commonly known as:  FLEXERIL Take 1 tablet (10 mg total) by mouth 3 (three) times daily as needed for muscle spasms.   dorzolamide-timolol 22.3-6.8 MG/ML ophthalmic solution Commonly known as:  COSOPT Place 1 drop into both eyes 2 (two) times daily.   esomeprazole 20 MG capsule Commonly known as:  NEXIUM Take 20 mg by mouth daily at 12 noon.   ferrous sulfate  325 (65 FE) MG tablet Take 325 mg by mouth daily.   furosemide 20 MG tablet Commonly known as:  LASIX Take 20 mg by mouth daily as needed for fluid. Try to limit to no more than 3-4 times per week.   gabapentin 100 MG capsule Commonly known as:  NEURONTIN Take 1 capsule (100 mg total) by mouth 3 (three) times daily.   rOPINIRole 0.25 MG tablet Commonly known as:  REQUIP Take 0.25 mg by mouth at bedtime as needed.         DISCHARGE INSTRUCTIONS:  See AVS.  If you experience worsening of your admission symptoms, develop shortness of breath, life threatening emergency, suicidal or homicidal thoughts you must seek medical attention immediately by calling 911 or calling your MD immediately  if symptoms less severe.  You Must read complete instructions/literature along with all the possible adverse reactions/side effects for all the Medicines you take and that have been prescribed to you. Take any new Medicines after you have completely understood and accpet all the possible adverse reactions/side effects.   Please note  You were cared for by a hospitalist during your hospital stay. If you have any questions about your discharge medications or the care you received while you were in the hospital after you are discharged, you can call the unit and asked to speak with the hospitalist on call if the hospitalist that took care of you is not available. Once you are discharged, your primary care physician will handle any further medical issues. Please note that NO REFILLS for any discharge medications will be authorized once you are discharged, as it is imperative that you return to your primary care physician (or establish a relationship with a primary care physician if you do not have one) for your aftercare needs so that they can reassess your need for medications and monitor your lab values.    On the day of Discharge:  VITAL SIGNS:  Blood pressure 138/73, pulse 77, temperature 98.3 F (36.8 C), temperature source Oral, resp. rate 18, height _0  (1.88 m), weight 200 lb (90.7 kg), SpO2 99 %. PHYSICAL EXAMINATION:  GENERAL:  76 y.o.-year-old patient lying in the bed with no acute distress.  EYES: Pupils equal, round, reactive to light and accommodation. No scleral icterus. Extraocular muscles intact.  HEENT: Head atraumatic, normocephalic. Oropharynx and nasopharynx clear.  NECK:  Supple, no jugular venous  distention. No thyroid enlargement, no tenderness.  LUNGS: Normal breath sounds bilaterally, no wheezing, rales,rhonchi or crepitation. No use of accessory muscles of respiration.  CARDIOVASCULAR: S1, S2 normal. No murmurs, rubs, or gallops.  ABDOMEN: Soft, non-tender, non-distended. Bowel sounds present. No organomegaly or mass.  EXTREMITIES: No pedal edema, cyanosis, or clubbing.  NEUROLOGIC: Cranial nerves II through XII are intact. Muscle strength 5/5 in all extremities. Sensation intact. Gait not checked.  PSYCHIATRIC: The patient is alert and oriented x 3.  SKIN: No obvious rash, lesion, or ulcer.  DATA REVIEW:   CBC  Recent Labs Lab 12/19/16 0509  WBC 7.4  HGB 12.5*  HCT 36.1*  PLT 128*    Chemistries   Recent Labs Lab 12/18/16 0935 12/19/16 0509  NA 139 140  K 4.0 4.4  CL 108 111  CO2 24 25  GLUCOSE 120* 111*  BUN 19 12  CREATININE 0.97 1.07  CALCIUM 8.8* 8.4*  AST 18  --   ALT 13*  --   ALKPHOS 120  --   BILITOT 1.0  --  Microbiology Results  Results for orders placed or performed during the hospital encounter of 12/18/16  Culture, blood (routine x 2)     Status: None (Preliminary result)   Collection Time: 12/18/16  5:18 PM  Result Value Ref Range Status   Specimen Description BLOOD LEFT HAND  Final   Special Requests   Final    BOTTLES DRAWN AEROBIC AND ANAEROBIC Blood Culture results may not be optimal due to an inadequate volume of blood received in culture bottles   Culture NO GROWTH < 24 HOURS  Final   Report Status PENDING  Incomplete  Culture, blood (routine x 2)     Status: None (Preliminary result)   Collection Time: 12/18/16  5:26 PM  Result Value Ref Range Status   Specimen Description BLOOD RESISTANT HAND  Final   Special Requests   Final    BOTTLES DRAWN AEROBIC AND ANAEROBIC Blood Culture adequate volume   Culture NO GROWTH < 24 HOURS  Final   Report Status PENDING  Incomplete    RADIOLOGY:  Ct Head Wo Contrast  Result Date:  12/18/2016 CLINICAL DATA:  Confusion. EXAM: CT HEAD WITHOUT CONTRAST TECHNIQUE: Contiguous axial images were obtained from the base of the skull through the vertex without intravenous contrast. COMPARISON:  CT scan of October 03, 2011. FINDINGS: Brain: No evidence of acute infarction, hemorrhage, hydrocephalus, extra-axial collection or mass lesion/mass effect. Vascular: No hyperdense vessel or unexpected calcification. Skull: Normal. Negative for fracture or focal lesion. Sinuses/Orbits: No acute finding. Other: None. IMPRESSION: Normal head CT. Electronically Signed   By: Marijo Conception, M.D.   On: 12/18/2016 15:05   Dg Foot Complete Left  Result Date: 12/19/2016 CLINICAL DATA:  Malodorous left foot with a skin ulceration present. EXAM: LEFT FOOT - COMPLETE 3+ VIEW COMPARISON:  MRI left foot 11/13/2016. FINDINGS: Small foci of gas seen in the plantar soft tissues on the lateral view at the level of the MTP joints and bases of the toes worrisome for skin ulcerations. No radiopaque foreign body is identified. No bony destructive change or periosteal reaction. No fracture or dislocation. IMPRESSION: Skin ulcerations without evidence of osteomyelitis. Electronically Signed   By: Inge Rise M.D.   On: 12/19/2016 09:18     Management plans discussed with the patient, family and they are in agreement.  CODE STATUS: Full Code   TOTAL TIME TAKING CARE OF THIS PATIENT: 27 minutes.    Demetrios Loll M.D on 12/19/2016 at 2:20 PM  Between 7am to 6pm - Pager - [phone]  After 6pm go to [email or web address] - Proofreader  [facility]  Hospitalists  Office  [phone]  CC: Primary care physician; Sofie Hartigan, MD   Note: This dictation was prepared with Dragon dictation along with smaller phrase technology. Any transcriptional errors that result from this process are unintentional.

## 2016-12-19 NOTE — Care Management Obs Status (Signed)
MEDICARE OBSERVATION STATUS NOTIFICATION   Patient Details  Name: Jeremy Herrera MRN: [id number] Date of Birth: 03/18/1940   Medicare Observation Status Notification Given:  No (admitte obs less than 24 hours)    Chapman FitchBOWEN, Hayat Warbington T, RN 12/19/2016, 9:57 AM

## 2016-12-19 NOTE — Progress Notes (Signed)
12/19/2016  3:22 PM  Jeremy Herrera to be D/C'd Home per MD order.  Discussed prescriptions and follow up appointments with the patient. Prescriptions given to patient, medication list explained in detail. Pt verbalized understanding.  Allergies as of 12/19/2016      Reactions   Hydrocodone-acetaminophen Nausea And Vomiting      Medication List    STOP taking these medications   amoxicillin-clavulanate 875-125 MG tablet Commonly known as:  AUGMENTIN   ciprofloxacin 500 MG tablet Commonly known as:  CIPRO   dutasteride 0.5 MG capsule Commonly known as:  AVODART   insulin aspart 100 UNIT/ML injection Commonly known as:  novoLOG   mupirocin ointment 2 % Commonly known as:  BACTROBAN   ondansetron 4 MG tablet Commonly known as:  ZOFRAN   oxyCODONE-acetaminophen 5-325 MG tablet Commonly known as:  PERCOCET   polyethylene glycol packet Commonly known as:  MIRALAX / GLYCOLAX     TAKE these medications   acetaminophen 500 MG tablet Commonly known as:  TYLENOL Take by mouth.   aspirin 81 MG tablet Take 81 mg by mouth daily.   B-12 1000 MCG/ML Kit Inject 1 mL as directed every 30 (thirty) days.   bimatoprost 0.01 % Soln Commonly known as:  LUMIGAN Place 1 drop into both eyes 2 (two) times daily.   cyclobenzaprine 10 MG tablet Commonly known as:  FLEXERIL Take 1 tablet (10 mg total) by mouth 3 (three) times daily as needed for muscle spasms.   dorzolamide-timolol 22.3-6.8 MG/ML ophthalmic solution Commonly known as:  COSOPT Place 1 drop into both eyes 2 (two) times daily.   esomeprazole 20 MG capsule Commonly known as:  NEXIUM Take 20 mg by mouth daily at 12 noon.   ferrous sulfate 325 (65 FE) MG tablet Take 325 mg by mouth daily.   furosemide 20 MG tablet Commonly known as:  LASIX Take 20 mg by mouth daily as needed for fluid. Try to limit to no more than 3-4 times per week.   gabapentin 100 MG capsule Commonly known as:  NEURONTIN Take 1 capsule  (100 mg total) by mouth 3 (three) times daily.   rOPINIRole 0.25 MG tablet Commonly known as:  REQUIP Take 0.25 mg by mouth at bedtime as needed.       Vitals:   12/19/16 0522 12/19/16 1316  BP: (!) 157/74 138/73  Pulse: 83 77  Resp: 20 18  Temp: 97.7 F (36.5 C) 98.3 F (36.8 C)  SpO2: 98% 99%    Skin clean, dry and intact without evidence of skin break down, no evidence of skin tears noted. IV catheter discontinued intact. Site without signs and symptoms of complications. Dressing and pressure applied. Pt denies pain at this time. No complaints noted.  An After Visit Summary was printed and given to the patient. Patient escorted via [employer], and D/C home via private auto.  Dola Argyle

## 2016-12-19 NOTE — Evaluation (Signed)
Physical Therapy Evaluation Patient Details Name: Jeremy Herrera MRN: [id number] DOB: May 16, 1940 Today's Date: 12/19/2016   History of Present Illness  Pt is a 76 y/o M who presented with nausea, vomiting, and diarrhea.  He was found to have profound dehydration and weakness.  Pt's PMH includes CHF, I&D L foot 11/14/16.      Clinical Impression  Pt admitted with above diagnosis. Pt currently with functional limitations due to the deficits listed below (see PT Problem List). Jeremy Herrera was ambulating household distances with RW PTA.  He currently requires min guard assist with transfers and ambulation for safety as pt did not have his post op shoe with him and therefore was instructed to refrain from placing weight through his heel.  Pt currently has intermittent assist available from family and friends but recommendation is for 24/7 assist/supervision for safety in home setting.  Pt will benefit from skilled PT to increase their independence and safety with mobility to allow discharge to the venue listed below.      Follow Up Recommendations Home health PT;Supervision/Assistance - 24 hour    Equipment Recommendations  None recommended by PT    Recommendations for Other Services       Precautions / Restrictions Precautions Precautions: Fall Restrictions Weight Bearing Restrictions: Yes LLE Weight Bearing: Non weight bearing Other Position/Activity Restrictions: Pt has been told that if he is wearing his post op shoe he is allowed to WB through heel only.  Pt did not have shoe as it is at home so had pt NWB through LLE during session today.       Mobility  Bed Mobility Overal bed mobility: Independent             General bed mobility comments: No physical assist or cues needed.  Pt performs independently.  Transfers Overall transfer level: Modified independent Equipment used: Rolling walker (2 wheeled)             General transfer comment: Mild instability noted with  sit>stand and pt is unable to maintain NWB LLE and places weight through heel despite cues.    Ambulation/Gait Ambulation/Gait assistance: Min guard Ambulation Distance (Feet): 12 Feet Assistive device: Rolling walker (2 wheeled) Gait Pattern/deviations: Step-to pattern (hop to)   Gait velocity interpretation: Below normal speed for age/gender General Gait Details: Pt adhered to NWB LLE with no LOB and very mild instability.    Stairs            Wheelchair Mobility    Modified Rankin (Stroke Patients Only)       Balance Overall balance assessment: Needs assistance;History of Falls Sitting-balance support: No upper extremity supported;Feet supported Sitting balance-Leahy Scale: Normal     Standing balance support: Bilateral upper extremity supported;During functional activity Standing balance-Leahy Scale: Poor Standing balance comment: Pt relies on UE support for static and dynamic activities                             Pertinent Vitals/Pain Pain Assessment: 0-10 Pain Score: 1  Pain Location: L foot Pain Descriptors / Indicators: Discomfort Pain Intervention(s): Limited activity within patient's tolerance;Monitored during session    Home Living Family/patient expects to be discharged to:: Private residence Living Arrangements: Children (living with daughter) Available Help at Discharge: Family;Available PRN/intermittently Type of Home: House Home Access: Stairs to enter Entrance Stairs-Rails: None Entrance Stairs-Number of Steps: 1 Home Layout: One level Home Equipment: Walker - 4 wheels;Cane - quad;Cane -  single point;Bedside commode;Transport chair Additional Comments: Daughter at work during the day, pt spends much of the day alone at home.  Pt had been set up with HHPT folllowing SNF stay but since he was only home for ~1 wk his HHPT had not yet begun.      Prior Function Level of Independence: Needs assistance   Gait / Transfers Assistance  Needed: Has been ambulating household distances with RW. 1 fall in the past 6 months.    ADL's / Homemaking Assistance Needed: Pt dresses independently and performs sponge baths independently.  Daughter does the cooking, cleaning, driving.         Hand Dominance   Dominant Hand: Right    Extremity/Trunk Assessment   Upper Extremity Assessment Upper Extremity Assessment: Overall WFL for tasks assessed    Lower Extremity Assessment Lower Extremity Assessment: LLE deficits/detail LLE Deficits / Details: Pt recently treated with I and D R foot ulcer, foot ace wrapped this date.         Communication   Communication: No difficulties  Cognition Arousal/Alertness: Awake/alert Behavior During Therapy: WFL for tasks assessed/performed Overall Cognitive Status: Within Functional Limits for tasks assessed                                        General Comments      Exercises     Assessment/Plan    PT Assessment Patient needs continued PT services  PT Problem List Decreased strength;Decreased range of motion;Decreased activity tolerance;Decreased balance;Decreased mobility;Decreased knowledge of use of DME;Decreased safety awareness;Pain;Decreased knowledge of precautions       PT Treatment Interventions DME instruction;Gait training;Stair training;Functional mobility training;Therapeutic activities;Therapeutic exercise;Balance training;Neuromuscular re-education;Patient/family education;Wheelchair mobility training    PT Goals (Current goals can be found in the Care Plan section)  Acute Rehab PT Goals Patient Stated Goal: to go home with his daughter PT Goal Formulation: With patient Time For Goal Achievement: 01/02/17 Potential to Achieve Goals: Good    Frequency Min 2X/week   Barriers to discharge Decreased caregiver support Pt has family and friends who can provide intermittent assist only    Co-evaluation               AM-PAC PT "6 Clicks"  Daily Activity  Outcome Measure Difficulty turning over in bed (including adjusting bedclothes, sheets and blankets)?: None Difficulty moving from lying on back to sitting on the side of the bed? : None Difficulty sitting down on and standing up from a chair with arms (e.g., wheelchair, bedside commode, etc,.)?: A Lot Help needed moving to and from a bed to chair (including a wheelchair)?: A Little Help needed walking in hospital room?: A Little Help needed climbing 3-5 steps with a railing? : A Lot 6 Click Score: 18    End of Session Equipment Utilized During Treatment: Gait belt Activity Tolerance: Patient tolerated treatment well;Patient limited by fatigue Patient left: with call bell/phone within reach;in bed;with bed alarm set Nurse Communication: Mobility status;Weight bearing status PT Visit Diagnosis: Unsteadiness on feet (R26.81);Other abnormalities of gait and mobility (R26.89);History of falling (Z91.81)    Time: 1110-1135 PT Time Calculation (min) (ACUTE ONLY): 25 min   Charges:   PT Evaluation $PT Eval Low Complexity: 1 Low PT Treatments $Gait Training: 8-22 mins   PT G Codes:   PT G-Codes **NOT FOR INPATIENT CLASS** Functional Assessment Tool Used: AM-PAC 6 Clicks Basic Mobility;Clinical judgement Functional  Limitation: Mobility: Walking and moving around Mobility: Walking and Moving Around Current Status [phone](G8978): At least 40 percent but less than 60 percent impaired, limited or restricted Mobility: Walking and Moving Around Goal Status [phone](G8979): At least 20 percent but less than 40 percent impaired, limited or restricted    Encarnacion ChuAshley Abashian PT, DPT 12/19/2016, 11:51 AM

## 2016-12-20 ENCOUNTER — Ambulatory Visit: Payer: Medicare PPO

## 2016-12-23 LAB — CULTURE, BLOOD (ROUTINE X 2)
CULTURE: NO GROWTH
Culture: NO GROWTH
Special Requests: ADEQUATE

## 2016-12-27 ENCOUNTER — Inpatient Hospital Stay: Payer: Medicare PPO | Attending: Hematology and Oncology

## 2016-12-27 VITALS — BP 151/74 | HR 96 | Temp 96.6°F | Resp 18

## 2016-12-27 DIAGNOSIS — Z79899 Other long term (current) drug therapy: Secondary | ICD-10-CM | POA: Insufficient documentation

## 2016-12-27 DIAGNOSIS — E538 Deficiency of other specified B group vitamins: Secondary | ICD-10-CM

## 2016-12-27 MED ORDER — CYANOCOBALAMIN 1000 MCG/ML IJ SOLN
1000.0000 ug | Freq: Once | INTRAMUSCULAR | Status: AC
  Administered 2016-12-27: 1000 ug via INTRAMUSCULAR
  Filled 2016-12-27: qty 1

## 2016-12-27 NOTE — Patient Instructions (Signed)

## 2016-12-27 NOTE — Progress Notes (Signed)
Patient reports that he accidentally stepped on an acorn in October which embedded into his foot.  He has since then had the foot operated on and has had PT and OT and is now back home.  He is ambulating with a walker and a soft boot on his foot.

## 2017-01-17 ENCOUNTER — Ambulatory Visit: Payer: Medicare PPO

## 2017-01-24 ENCOUNTER — Inpatient Hospital Stay: Payer: Medicare PPO | Attending: Hematology and Oncology

## 2017-01-24 ENCOUNTER — Other Ambulatory Visit: Payer: Self-pay | Admitting: Urgent Care

## 2017-01-24 VITALS — BP 129/71 | HR 80 | Temp 95.7°F | Resp 20

## 2017-01-24 DIAGNOSIS — Z79899 Other long term (current) drug therapy: Secondary | ICD-10-CM | POA: Diagnosis not present

## 2017-01-24 DIAGNOSIS — E538 Deficiency of other specified B group vitamins: Secondary | ICD-10-CM | POA: Insufficient documentation

## 2017-01-24 MED ORDER — CYANOCOBALAMIN 1000 MCG/ML IJ SOLN
1000.0000 ug | Freq: Once | INTRAMUSCULAR | Status: AC
  Administered 2017-01-24: 1000 ug via INTRAMUSCULAR

## 2017-02-14 ENCOUNTER — Other Ambulatory Visit: Payer: Medicare PPO

## 2017-02-14 ENCOUNTER — Ambulatory Visit: Payer: Medicare PPO

## 2017-02-14 ENCOUNTER — Ambulatory Visit: Payer: Medicare PPO | Admitting: Hematology and Oncology

## 2017-02-21 ENCOUNTER — Inpatient Hospital Stay: Payer: Medicare PPO

## 2017-02-21 ENCOUNTER — Inpatient Hospital Stay: Payer: Medicare PPO | Attending: Hematology and Oncology | Admitting: Hematology and Oncology

## 2017-02-21 VITALS — BP 132/88 | HR 73 | Temp 97.9°F | Resp 20 | Wt 216.3 lb

## 2017-02-21 DIAGNOSIS — I509 Heart failure, unspecified: Secondary | ICD-10-CM | POA: Insufficient documentation

## 2017-02-21 DIAGNOSIS — D509 Iron deficiency anemia, unspecified: Secondary | ICD-10-CM

## 2017-02-21 DIAGNOSIS — E785 Hyperlipidemia, unspecified: Secondary | ICD-10-CM | POA: Diagnosis not present

## 2017-02-21 DIAGNOSIS — D696 Thrombocytopenia, unspecified: Secondary | ICD-10-CM | POA: Insufficient documentation

## 2017-02-21 DIAGNOSIS — E538 Deficiency of other specified B group vitamins: Secondary | ICD-10-CM | POA: Insufficient documentation

## 2017-02-21 DIAGNOSIS — I10 Essential (primary) hypertension: Secondary | ICD-10-CM | POA: Insufficient documentation

## 2017-02-21 DIAGNOSIS — Z79899 Other long term (current) drug therapy: Secondary | ICD-10-CM | POA: Diagnosis not present

## 2017-02-21 DIAGNOSIS — R911 Solitary pulmonary nodule: Secondary | ICD-10-CM

## 2017-02-21 DIAGNOSIS — K219 Gastro-esophageal reflux disease without esophagitis: Secondary | ICD-10-CM | POA: Insufficient documentation

## 2017-02-21 DIAGNOSIS — Z862 Personal history of diseases of the blood and blood-forming organs and certain disorders involving the immune mechanism: Secondary | ICD-10-CM

## 2017-02-21 LAB — CBC WITH DIFFERENTIAL/PLATELET
Basophils Absolute: 0.1 10*3/uL (ref 0–0.1)
Basophils Relative: 1 %
Eosinophils Absolute: 0.2 10*3/uL (ref 0–0.7)
Eosinophils Relative: 3 %
HCT: 39.3 % — ABNORMAL LOW (ref 40.0–52.0)
Hemoglobin: 13 g/dL (ref 13.0–18.0)
Lymphocytes Relative: 22 %
Lymphs Abs: 1.3 10*3/uL (ref 1.0–3.6)
MCH: 30.5 pg (ref 26.0–34.0)
MCHC: 33 g/dL (ref 32.0–36.0)
MCV: 92.5 fL (ref 80.0–100.0)
Monocytes Absolute: 0.4 10*3/uL (ref 0.2–1.0)
Monocytes Relative: 6 %
Neutro Abs: 4 10*3/uL (ref 1.4–6.5)
Neutrophils Relative %: 68 %
Platelets: 163 10*3/uL (ref 150–440)
RBC: 4.25 MIL/uL — ABNORMAL LOW (ref 4.40–5.90)
RDW: 13.4 % (ref 11.5–14.5)
WBC: 5.8 10*3/uL (ref 3.8–10.6)

## 2017-02-21 LAB — COMPREHENSIVE METABOLIC PANEL
ALT: 12 U/L — ABNORMAL LOW (ref 17–63)
AST: 16 U/L (ref 15–41)
Albumin: 3.7 g/dL (ref 3.5–5.0)
Alkaline Phosphatase: 137 U/L — ABNORMAL HIGH (ref 38–126)
Anion gap: 8 (ref 5–15)
BUN: 22 mg/dL — ABNORMAL HIGH (ref 6–20)
CO2: 25 mmol/L (ref 22–32)
Calcium: 8.6 mg/dL — ABNORMAL LOW (ref 8.9–10.3)
Chloride: 106 mmol/L (ref 101–111)
Creatinine, Ser: 1.13 mg/dL (ref 0.61–1.24)
GFR calc Af Amer: >60 mL/min (ref >60)
GFR calc non Af Amer: >60 mL/min (ref >60)
Glucose, Bld: 112 mg/dL — ABNORMAL HIGH (ref 65–99)
Potassium: 4.3 mmol/L (ref 3.5–5.1)
Sodium: 139 mmol/L (ref 135–145)
Total Bilirubin: 0.6 mg/dL (ref 0.3–1.2)
Total Protein: 7.5 g/dL (ref 6.5–8.1)

## 2017-02-21 LAB — FOLATE: Folate: 17.9 ng/mL (ref >5.9)

## 2017-02-21 LAB — FERRITIN: Ferritin: 85 ng/mL (ref 24–336)

## 2017-02-21 MED ORDER — CYANOCOBALAMIN 1000 MCG/ML IJ SOLN
1000.0000 ug | Freq: Once | INTRAMUSCULAR | Status: AC
  Administered 2017-02-21: 1000 ug via INTRAMUSCULAR
  Filled 2017-02-21: qty 1

## 2017-02-21 NOTE — Progress Notes (Signed)
Patient injured his foot while walking barefooted on his deck.  He had to have surgery on 9-24.  He recently returned to Dr. Ether GriffinsFowler when he noted swelling and redness.  Currently on Keflex 500 mg daily.  Will see Dr. Ether GriffinsFowler again on 02-26-17.  Otherwise, no complaints.

## 2017-02-21 NOTE — Progress Notes (Signed)
[facility] day:  02/21/17   Chief Complaint: Jeremy Herrera is a 77 y.o. male with B12 deficiency, iron deficiency, and mild thrombocytopenia (ITP) who is seen for 6 month assessment.  HPI: The patient was last seen in the medical oncology clinic on 08/30/2016.  At that time, he denied any respiratory symptoms.  Exam was stable.  Hematocrit was 39.8. Platelet count was 142,000.  Ferritin was 87.  He continued oral iron and monthly B12 (last 01/24/2017).  CBC on 12/19/2016 revealed a hematocrit of 36.1, hemoglobin 12.5, MCV 91.7, platelets 128,000, and white count 7400.   He stepped on an acorn in 10/2016.  Wound on LEFT foot became infected. He underwent irrigation and debridement on 11/14/2016 by Dr. Vickki Muff, podiatrist.  He then received PT and OT.  He spent 20 days in [facility].   Symptomatically, he is having pain and swelling in his LEFT foot. Patient has been seen in follow up consult by Dr. Vickki Muff on 02/19/2017. Patient has diagnostic plain films this week that demonstrated no fractures. There was no appreciated gas collection suggestive of osteomyelitis. Patient is currently on a course of Cephalexin. He presents to the office in an orthopedic shoe.    He denies increased bruising. He also denies bleeding; no hematochezia, melena, or gross hematuria. Patient is eating well and not losing weight.  Patient continues on daily oral iron supplementation.    Past Medical History:  Diagnosis Date  . Anemia   . BPH (benign prostatic hyperplasia)   . CHF (congestive heart failure) ([place])   . Dysphagia   . GERD (gastroesophageal reflux disease)   . HLD (hyperlipidemia)   . HTN (hypertension)   . Irregular heart beat   . Nocturia   . Urinary retention     Past Surgical History:  Procedure Laterality Date  . bladder patching  1964  . HIP SURGERY Right 2014  . IRRIGATION AND DEBRIDEMENT FOOT Left 11/14/2016   Procedure:  IRRIGATION AND DEBRIDEMENT FOOT;  Surgeon: Samara Deist, DPM;  Location: [facility] ORS;  Service: Podiatry;  Laterality: Left;  . TRANSURETHRAL RESECTION OF PROSTATE      Family History  Problem Relation Age of Onset  . Ulcers Mother        stomach  . GER disease Sister        GERD  . Heart attack Father   . Diabetes Brother   . Depression Brother   . Kidney disease Neg Hx   . Prostate cancer Neg Hx   . Kidney cancer Neg Hx   . Bladder Cancer Neg Hx     Social History:  reports that  has never smoked. he has never used smokeless tobacco. He reports that he does not drink alcohol or use drugs.   He lives in [place].  He is alone today.  Allergies:  Allergies  Allergen Reactions  . Hydrocodone-Acetaminophen Nausea And Vomiting    Current Medications: Current Outpatient Medications  Medication Sig Dispense Refill  . acetaminophen (TYLENOL) 500 MG tablet Take by mouth.    Marland Kitchen aspirin 81 MG tablet Take 81 mg by mouth daily.    . cephALEXin (KEFLEX) 500 MG capsule Take 500 mg by mouth daily.    . Cyanocobalamin (B-12) 1000 MCG/ML KIT Inject 1 mL as directed every 30 (thirty) days.    . cyclobenzaprine (FLEXERIL) 10 MG tablet Take 1 tablet (10 mg total) by mouth 3 (three) times daily as needed for muscle  spasms. 30 tablet 0  . ferrous sulfate 325 (65 FE) MG tablet Take 325 mg by mouth daily.    Marland Kitchen gabapentin (NEURONTIN) 100 MG capsule Take 1 capsule (100 mg total) by mouth 3 (three) times daily.    Marland Kitchen rOPINIRole (REQUIP) 0.25 MG tablet Take 0.25 mg by mouth at bedtime as needed.     . bimatoprost (LUMIGAN) 0.01 % SOLN Place 1 drop into both eyes 2 (two) times daily.     . dorzolamide-timolol (COSOPT) 22.3-6.8 MG/ML ophthalmic solution Place 1 drop into both eyes 2 (two) times daily.     Marland Kitchen esomeprazole (NEXIUM) 20 MG capsule Take 20 mg by mouth daily at 12 noon.    . furosemide (LASIX) 20 MG tablet Take 20 mg by mouth daily as needed for fluid. Try to limit to no more than 3-4 times per  week.     No current facility-administered medications for this visit.     Review of Systems:  GENERAL:  Feels "good". No fevers or sweats.  Weight up 5 pounds. PERFORMANCE STATUS (ECOG): 1-2 HEENT:  No visual changes, runny nose, sore throat, mouth sores or tenderness. Lungs:  Shortness of breath with exertion.  No cough.  No hemoptysis. Cardiac:  No chest pain, palpitations, orthopnea, or PND. GI:   Swallowing difficulties.  No nausea, vomiting, diarrhea, constipation, melena or hematochezia. GU:  No urgency, frequency, dysuria, or hematuria. Musculoskeletal:  No back pain.  No joint pain.  No muscle tenderness. Extremities:  No pain or swelling. Skin:  No rashes or skin changes. Neuro:  No headache, numbness or weakness, balance or coordination issues. Endocrine:  No diabetes, thyroid issues, hot flashes or night sweats. Psych:  No mood changes, depression or anxiety. Pain:  Focal pain (left foot 1 out of 10). Review of systems:  All other systems reviewed and found to be negative.  Physical Exam: Blood pressure 132/88, pulse 73, temperature 97.9 F (36.6 C), temperature source Tympanic, resp. rate 20, weight 216 lb 4.3 oz (98.1 kg). GENERAL:  Well developed, well nourished, tall gentleman sitting comfortably in the exam room in no acute distress.  He has a cane at his side. MENTAL STATUS:  Alert and oriented to person, place and time. HEAD:  Pearline Cables thin hair.  Male pattern baldness.  Normocephalic, atraumatic, face symmetric, no Cushingoid features. EYES:  Glasses.  Blue eyes. Pupils equal round and reactive to light and accomodation.  No conjunctivitis or scleral icterus. ENT:  Clears throat regularly.  Oropharynx clear without lesion.  Tongue normal. Mucous membranes moist.  RESPIRATORY:  Clear to auscultation without rales, wheezes or rhonchi. CARDIOVASCULAR:  Regular rate and rhythm without murmur, rub or gallop. ABDOMEN:  Soft, non-tender, with active bowel sounds, and no  hepatosplenomegaly.  No masses. SKIN: No rashes, ulcers or lesions. EXTREMITIES: Orthopedic shoe.  Left foot edema.  No skin discoloration or tenderness.  No palpable cords. LYMPH NODES: No palpable cervical, supraclavicular, axillary or inguinal adenopathy  NEUROLOGICAL: Unremarkable. PSYCH:  Appropriate.    Appointment on 02/21/2017  Component Date Value Ref Range Status  . Sodium 02/21/2017 139  135 - 145 mmol/L Final  . Potassium 02/21/2017 4.3  3.5 - 5.1 mmol/L Final  . Chloride 02/21/2017 106  101 - 111 mmol/L Final  . CO2 02/21/2017 25  22 - 32 mmol/L Final  . Glucose, Bld 02/21/2017 112* 65 - 99 mg/dL Final  . BUN 02/21/2017 22* 6 - 20 mg/dL Final  . Creatinine, Ser 02/21/2017 1.13  0.61 -  1.24 mg/dL Final  . Calcium 02/21/2017 8.6* 8.9 - 10.3 mg/dL Final  . Total Protein 02/21/2017 7.5  6.5 - 8.1 g/dL Final  . Albumin 02/21/2017 3.7  3.5 - 5.0 g/dL Final  . AST 02/21/2017 16  15 - 41 U/L Final  . ALT 02/21/2017 12* 17 - 63 U/L Final  . Alkaline Phosphatase 02/21/2017 137* 38 - 126 U/L Final  . Total Bilirubin 02/21/2017 0.6  0.3 - 1.2 mg/dL Final  . GFR calc non Af Amer 02/21/2017 >60  >60 mL/min Final  . GFR calc Af Amer 02/21/2017 >60  >60 mL/min Final   Comment: (NOTE) The eGFR has been calculated using the CKD EPI equation. This calculation has not been validated in all clinical situations. eGFR's persistently <60 mL/min signify possible Chronic Kidney Disease.   Georgiann Hahn gap 02/21/2017 8  5 - 15 Final   Performed at [facility], [address], [place], [place] [address]  . WBC 02/21/2017 5.8  3.8 - 10.6 K/uL Final  . RBC 02/21/2017 4.25* 4.40 - 5.90 MIL/uL Final  . Hemoglobin 02/21/2017 13.0  13.0 - 18.0 g/dL Final  . HCT 02/21/2017 39.3* 40.0 - 52.0 % Final  . MCV 02/21/2017 92.5  80.0 - 100.0 fL Final  . MCH 02/21/2017 30.5  26.0 - 34.0 pg Final  . MCHC 02/21/2017 33.0  32.0 - 36.0 g/dL Final  . RDW 02/21/2017 13.4  11.5 - 14.5 % Final  .  Platelets 02/21/2017 163  150 - 440 K/uL Final  . Neutrophils Relative % 02/21/2017 68  % Final  . Neutro Abs 02/21/2017 4.0  1.4 - 6.5 K/uL Final  . Lymphocytes Relative 02/21/2017 22  % Final  . Lymphs Abs 02/21/2017 1.3  1.0 - 3.6 K/uL Final  . Monocytes Relative 02/21/2017 6  % Final  . Monocytes Absolute 02/21/2017 0.4  0.2 - 1.0 K/uL Final  . Eosinophils Relative 02/21/2017 3  % Final  . Eosinophils Absolute 02/21/2017 0.2  0 - 0.7 K/uL Final  . Basophils Relative 02/21/2017 1  % Final  . Basophils Absolute 02/21/2017 0.1  0 - 0.1 K/uL Final   Performed at [facility], [address], [place], [place] [address]    Assessment:  Jeremy Herrera is a 77 y.o. 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  He denies any new medications or herbal products. He has no known autoimmune disease. He has never required transfusion.   Work-up on 09/16/2014 revealed a hematocrit 36.4, hemoglobin 12.3, MCV 91.5, platelets 124,000 white count 4800 with an ANC of 3000. Differential was unremarkable. B12 was 262 (low normal) with an elevated MMA consistent with B12 deficiency.  He began weekly 10/14/2014 (last 01/24/2017).  Folate was 18.1 on 08/30/2016 and 17.9 on 02/21/2017.  Normal labs included: folate, CMP, ANA, iron studies, hepatitis C antibody, hepatitis B surface antigen and core antibody, HIV testing, SPEP, free light chains, and PTT. TSH  was normal on 08/25/2014.  UPEP revealed no monoclonal protein.  He has a history of iron deficiency anemia.  EGD and colonoscopy 10 years ago were negative. He required esophageal dilatation. He has a history of reflux. Colonoscopy 3-4 years ago was unremarkable per the patient.  His diet is good.  He eats meat twice a week.  He is on oral iron.  Ferritin has been followed: 61 on 11/24/2015, 73 on 02/23/2016, 79 on 05/24/2016, 83 on 07/04/2016, 87  on  08/30/2016, and 85 on 02/21/2017.  He was diagnosed with a community acquired pneumonia on 05/20/2015.  He is completing a Z pack.  Chest CT angiogram on 05/20/2015 revealed a nodular focus of consolidation (indeterminate) for which neoplasm could not be completely excluded. Recommendation was for chest CT without contrast 1-2 months.   Chest CT on 07/05/2015 revealed resolved medial right lower lobe consolidation with residual mild ground-glass opacity.  There was mild patchy subpleural reticulation and ground-glass attenuation in both lungs, cannot exclude interstitial lung disease such as nonspecific interstitial pneumonia (NSIP).   High resolution chest CT on 07/04/2016 was compatible with interstitial lung disease. The overall pattern of disease was nonspecific, however, there was clear evidence of progression compared to the prior study 07/05/2015. Findings are worrisome for potential usual interstitial pneumonia (UIP), however, this may simply reflect chronic hypersensitivity pneumonitis or nonspecific interstitial pneumonia (NSIP). Accordingly, repeat high-resolution chest CT is recommended in 12 months to assess for temporal changes in the appearance of the lung parenchyma.  Most pulmonary nodules are stable except a 7 x 5 mm nodule in the left lower lobe which has slightly enlarged. Attention at time of follow-up examination in 1 year was recommended.    He has a history of an elevated alkaline phosphatase (138-151) without trend since 09/2011.  Symptomatically, he denies any respiratory symptoms.  Exam reveals a swollen and erythematous LEFT foot. He is being seen by podiatry and is currently on a course of Keflex.  He follows up with podiatry in 1 week.  Hematocrit is 39.3. Platelet count is 163,000.  Ferritin is 85.   Plan: 1.  Labs today:  CBC with diff, ferritin, folate. 2.  Review interval high resolution chest CT.  Findings compatible with ILD (interstitial lung disease) and possibly  suggestive of usual interstitial pneumonia (UIP), or chronic hypersensitivity pneumonitis or nonspecific interstitial pneumonia (NSIP).  Discuss 7 mm LLL nodule.  Discuss plan for follow-up high resolution chest CT in May 2019. Discuss ongoing follow-up with pulmonary medicine. 3.  Schedule high resolution chest CT on 07/04/2017. 4.  B12 today and monthly x 5. 5.  RTC in 3 months for labs (CBC with diff, ferritin) + B12 6.  RTC in 6 months for MD assessment, labs (CBC with diff, CMP, ferritin, folate), and B12.   Honor Loh, NP  02/21/2017, 11:39 AM   I saw and evaluated the patient, participating in the key portions of the service and reviewing pertinent diagnostic studies and records.  I reviewed the nurse practitioner's note and agree with the findings and the plan.  The assessment and plan were discussed with the patient.  Additional diagnostic studies of a high resolution chest CT are needed for follow-up and would change the clinical management.  A few questions were asked by the patient and answered.   Nolon Stalls, MD 02/21/2017, 11:39 AM

## 2017-02-21 NOTE — Patient Instructions (Signed)
Cyanocobalamin, Vitamin B12 injection What is this medicine? CYANOCOBALAMIN (sye an oh koe BAL a min) is a man made form of vitamin B12. Vitamin B12 is used in the growth of healthy blood cells, nerve cells, and proteins in the body. It also helps with the metabolism of fats and carbohydrates. This medicine is used to treat people who can not absorb vitamin B12. This medicine may be used for other purposes; ask your health care provider or pharmacist if you have questions. COMMON BRAND NAME(S): B-12 Compliance Kit, B-12 Injection Kit, Cyomin, LA-12, Nutri-Twelve, Physicians EZ Use B-12, Primabalt What should I tell my health care provider before I take this medicine? They need to know if you have any of these conditions: -kidney disease -Leber's disease -megaloblastic anemia -an unusual or allergic reaction to cyanocobalamin, cobalt, other medicines, foods, dyes, or preservatives -pregnant or trying to get pregnant -breast-feeding How should I use this medicine? This medicine is injected into a muscle or deeply under the skin. It is usually given by a health care professional in a clinic or doctor's office. However, your doctor may teach you how to inject yourself. Follow all instructions. Talk to your pediatrician regarding the use of this medicine in children. Special care may be needed. Overdosage: If you think you have taken too much of this medicine contact a poison control center or emergency room at once. NOTE: This medicine is only for you. Do not share this medicine with others. What if I miss a dose? If you are given your dose at a clinic or doctor's office, call to reschedule your appointment. If you give your own injections and you miss a dose, take it as soon as you can. If it is almost time for your next dose, take only that dose. Do not take double or extra doses. What may interact with this medicine? -colchicine -heavy alcohol intake This list may not describe all possible  interactions. Give your health care provider a list of all the medicines, herbs, non-prescription drugs, or dietary supplements you use. Also tell them if you smoke, drink alcohol, or use illegal drugs. Some items may interact with your medicine. What should I watch for while using this medicine? Visit your doctor or health care professional regularly. You may need blood work done while you are taking this medicine. You may need to follow a special diet. Talk to your doctor. Limit your alcohol intake and avoid smoking to get the best benefit. What side effects may I notice from receiving this medicine? Side effects that you should report to your doctor or health care professional as soon as possible: -allergic reactions like skin rash, itching or hives, swelling of the face, lips, or tongue -blue tint to skin -chest tightness, pain -difficulty breathing, wheezing -dizziness -red, swollen painful area on the leg Side effects that usually do not require medical attention (report to your doctor or health care professional if they continue or are bothersome): -diarrhea -headache This list may not describe all possible side effects. Call your doctor for medical advice about side effects. You may report side effects to FDA at 1-800-FDA-1088. Where should I keep my medicine? Keep out of the reach of children. Store at room temperature between 15 and 30 degrees C (59 and 85 degrees F). Protect from light. Throw away any unused medicine after the expiration date. NOTE: This sheet is a summary. It may not cover all possible information. If you have questions about this medicine, talk to your doctor, pharmacist, or   health care provider.  2018 Elsevier/Gold Standard (2007-05-20 22:10:20)  

## 2017-03-11 ENCOUNTER — Encounter: Payer: Self-pay | Admitting: Hematology and Oncology

## 2017-03-11 DIAGNOSIS — R911 Solitary pulmonary nodule: Secondary | ICD-10-CM | POA: Insufficient documentation

## 2017-03-21 ENCOUNTER — Inpatient Hospital Stay: Payer: Medicare PPO

## 2017-03-21 DIAGNOSIS — E538 Deficiency of other specified B group vitamins: Secondary | ICD-10-CM | POA: Diagnosis not present

## 2017-03-21 MED ORDER — CYANOCOBALAMIN 1000 MCG/ML IJ SOLN
1000.0000 ug | Freq: Once | INTRAMUSCULAR | Status: AC
  Administered 2017-03-21: 1000 ug via INTRAMUSCULAR
  Filled 2017-03-21: qty 1

## 2017-04-12 DIAGNOSIS — R2681 Unsteadiness on feet: Secondary | ICD-10-CM | POA: Insufficient documentation

## 2017-04-16 ENCOUNTER — Other Ambulatory Visit: Payer: Self-pay | Admitting: Podiatry

## 2017-04-16 DIAGNOSIS — S92532S Displaced fracture of distal phalanx of left lesser toe(s), sequela: Secondary | ICD-10-CM

## 2017-04-18 ENCOUNTER — Inpatient Hospital Stay: Payer: Medicare PPO | Attending: Hematology and Oncology

## 2017-04-18 VITALS — BP 180/75 | HR 88 | Temp 96.4°F | Resp 21

## 2017-04-18 DIAGNOSIS — E538 Deficiency of other specified B group vitamins: Secondary | ICD-10-CM | POA: Diagnosis present

## 2017-04-18 MED ORDER — CYANOCOBALAMIN 1000 MCG/ML IJ SOLN
1000.0000 ug | Freq: Once | INTRAMUSCULAR | Status: AC
  Administered 2017-04-18: 1000 ug via INTRAMUSCULAR

## 2017-04-24 ENCOUNTER — Other Ambulatory Visit: Payer: Self-pay | Admitting: Podiatry

## 2017-04-24 DIAGNOSIS — L02619 Cutaneous abscess of unspecified foot: Secondary | ICD-10-CM

## 2017-04-24 DIAGNOSIS — L03119 Cellulitis of unspecified part of limb: Principal | ICD-10-CM

## 2017-04-25 ENCOUNTER — Ambulatory Visit
Admission: RE | Admit: 2017-04-25 | Discharge: 2017-04-25 | Disposition: A | Discharge status: Home / Self Care | Payer: Medicare PPO | Source: Ambulatory Visit | Attending: Podiatry | Admitting: Podiatry

## 2017-04-25 ENCOUNTER — Other Ambulatory Visit
Admission: RE | Admit: 2017-04-25 | Discharge: 2017-04-25 | Disposition: A | Discharge status: Home / Self Care | Payer: Medicare PPO | Source: Ambulatory Visit | Attending: Podiatry | Admitting: Podiatry

## 2017-04-25 DIAGNOSIS — L02619 Cutaneous abscess of unspecified foot: Secondary | ICD-10-CM | POA: Diagnosis not present

## 2017-04-25 DIAGNOSIS — L02512 Cutaneous abscess of left hand: Secondary | ICD-10-CM | POA: Diagnosis not present

## 2017-04-25 DIAGNOSIS — M868X7 Other osteomyelitis, ankle and foot: Secondary | ICD-10-CM | POA: Diagnosis not present

## 2017-04-25 DIAGNOSIS — S92532S Displaced fracture of distal phalanx of left lesser toe(s), sequela: Secondary | ICD-10-CM | POA: Diagnosis present

## 2017-04-25 DIAGNOSIS — L03119 Cellulitis of unspecified part of limb: Secondary | ICD-10-CM | POA: Diagnosis present

## 2017-04-25 LAB — CREATININE, SERUM
Creatinine, Ser: 1.04 mg/dL (ref 0.61–1.24)
GFR calc Af Amer: >60 mL/min (ref >60)
GFR calc non Af Amer: >60 mL/min (ref >60)

## 2017-04-25 LAB — BUN: BUN: 18 mg/dL (ref 6–20)

## 2017-04-25 MED ORDER — GADOBENATE DIMEGLUMINE 529 MG/ML IV SOLN
20.0000 mL | Freq: Once | INTRAVENOUS | Status: AC | PRN
  Administered 2017-04-25: 20 mL via INTRAVENOUS

## 2017-05-02 ENCOUNTER — Inpatient Hospital Stay
Admission: AD | Admit: 2017-05-02 | Discharge: 2017-05-06 | DRG: 617 | Disposition: A | Discharge status: Home Health | Payer: Medicare PPO | Source: Ambulatory Visit | Attending: Internal Medicine | Admitting: Internal Medicine

## 2017-05-02 ENCOUNTER — Other Ambulatory Visit: Payer: Self-pay

## 2017-05-02 DIAGNOSIS — I4891 Unspecified atrial fibrillation: Secondary | ICD-10-CM | POA: Diagnosis not present

## 2017-05-02 DIAGNOSIS — E1169 Type 2 diabetes mellitus with other specified complication: Principal | ICD-10-CM | POA: Diagnosis present

## 2017-05-02 DIAGNOSIS — Z7982 Long term (current) use of aspirin: Secondary | ICD-10-CM | POA: Diagnosis not present

## 2017-05-02 DIAGNOSIS — I509 Heart failure, unspecified: Secondary | ICD-10-CM | POA: Diagnosis present

## 2017-05-02 DIAGNOSIS — M869 Osteomyelitis, unspecified: Secondary | ICD-10-CM | POA: Diagnosis present

## 2017-05-02 DIAGNOSIS — E785 Hyperlipidemia, unspecified: Secondary | ICD-10-CM | POA: Diagnosis present

## 2017-05-02 DIAGNOSIS — Z79899 Other long term (current) drug therapy: Secondary | ICD-10-CM

## 2017-05-02 DIAGNOSIS — G609 Hereditary and idiopathic neuropathy, unspecified: Secondary | ICD-10-CM | POA: Diagnosis present

## 2017-05-02 DIAGNOSIS — I110 Hypertensive heart disease with heart failure: Secondary | ICD-10-CM | POA: Diagnosis present

## 2017-05-02 DIAGNOSIS — R1310 Dysphagia, unspecified: Secondary | ICD-10-CM | POA: Diagnosis present

## 2017-05-02 DIAGNOSIS — R338 Other retention of urine: Secondary | ICD-10-CM | POA: Diagnosis present

## 2017-05-02 DIAGNOSIS — G2581 Restless legs syndrome: Secondary | ICD-10-CM | POA: Diagnosis present

## 2017-05-02 DIAGNOSIS — K219 Gastro-esophageal reflux disease without esophagitis: Secondary | ICD-10-CM | POA: Diagnosis present

## 2017-05-02 DIAGNOSIS — H409 Unspecified glaucoma: Secondary | ICD-10-CM | POA: Diagnosis present

## 2017-05-02 DIAGNOSIS — Z01810 Encounter for preprocedural cardiovascular examination: Secondary | ICD-10-CM | POA: Diagnosis not present

## 2017-05-02 DIAGNOSIS — N401 Enlarged prostate with lower urinary tract symptoms: Secondary | ICD-10-CM | POA: Diagnosis present

## 2017-05-02 DIAGNOSIS — Z885 Allergy status to narcotic agent status: Secondary | ICD-10-CM | POA: Diagnosis not present

## 2017-05-02 LAB — COMPREHENSIVE METABOLIC PANEL
ALT: 11 U/L — AB (ref 17–63)
AST: 18 U/L (ref 15–41)
Albumin: 3.5 g/dL (ref 3.5–5.0)
Alkaline Phosphatase: 122 U/L (ref 38–126)
Anion gap: 9 (ref 5–15)
BUN: 19 mg/dL (ref 6–20)
CHLORIDE: 110 mmol/L (ref 101–111)
CO2: 21 mmol/L — AB (ref 22–32)
CREATININE: 1.09 mg/dL (ref 0.61–1.24)
Calcium: 8.6 mg/dL — ABNORMAL LOW (ref 8.9–10.3)
GFR calc Af Amer: >60 mL/min (ref >60)
GFR calc non Af Amer: >60 mL/min (ref >60)
Glucose, Bld: 151 mg/dL — ABNORMAL HIGH (ref 65–99)
POTASSIUM: 4.9 mmol/L (ref 3.5–5.1)
SODIUM: 140 mmol/L (ref 135–145)
Total Bilirubin: 0.6 mg/dL (ref 0.3–1.2)
Total Protein: 6.7 g/dL (ref 6.5–8.1)

## 2017-05-02 LAB — C-REACTIVE PROTEIN

## 2017-05-02 LAB — CBC WITH DIFFERENTIAL/PLATELET
BASOS ABS: 0.1 10*3/uL (ref 0–0.1)
BASOS PCT: 1 %
Eosinophils Absolute: 0.1 10*3/uL (ref 0–0.7)
Eosinophils Relative: 2 %
HCT: 36.8 % — ABNORMAL LOW (ref 40.0–52.0)
Hemoglobin: 12.4 g/dL — ABNORMAL LOW (ref 13.0–18.0)
LYMPHS PCT: 24 %
Lymphs Abs: 1.3 10*3/uL (ref 1.0–3.6)
MCH: 30.4 pg (ref 26.0–34.0)
MCHC: 33.7 g/dL (ref 32.0–36.0)
MCV: 90.3 fL (ref 80.0–100.0)
MONO ABS: 0.4 10*3/uL (ref 0.2–1.0)
MONOS PCT: 7 %
Neutro Abs: 3.7 10*3/uL (ref 1.4–6.5)
Neutrophils Relative %: 66 %
PLATELETS: 148 10*3/uL — AB (ref 150–440)
RBC: 4.08 MIL/uL — ABNORMAL LOW (ref 4.40–5.90)
RDW: 13.5 % (ref 11.5–14.5)
WBC: 5.6 10*3/uL (ref 3.8–10.6)

## 2017-05-02 LAB — SEDIMENTATION RATE: SED RATE: 49 mm/h — AB (ref 0–20)

## 2017-05-02 MED ORDER — CYCLOBENZAPRINE HCL 10 MG PO TABS
10.0000 mg | ORAL_TABLET | Freq: Three times a day (TID) | ORAL | Status: DC | PRN
  Administered 2017-05-02 – 2017-05-05 (×5): 10 mg via ORAL
  Filled 2017-05-02 (×6): qty 1

## 2017-05-02 MED ORDER — SODIUM CHLORIDE 0.9% FLUSH
3.0000 mL | Freq: Two times a day (BID) | INTRAVENOUS | Status: DC
  Administered 2017-05-02 – 2017-05-06 (×6): 3 mL via INTRAVENOUS

## 2017-05-02 MED ORDER — ACETAMINOPHEN 650 MG RE SUPP: 650.0000 mg | Freq: Four times a day (QID) | RECTAL | Status: DC | PRN

## 2017-05-02 MED ORDER — VANCOMYCIN HCL IN DEXTROSE 1-5 GM/200ML-% IV SOLN
1000.0000 mg | Freq: Once | INTRAVENOUS | Status: AC
  Administered 2017-05-02: 1000 mg via INTRAVENOUS
  Filled 2017-05-02: qty 200

## 2017-05-02 MED ORDER — PIPERACILLIN-TAZOBACTAM 3.375 G IVPB
3.3750 g | Freq: Three times a day (TID) | INTRAVENOUS | Status: DC
  Administered 2017-05-02 – 2017-05-05 (×8): 3.375 g via INTRAVENOUS
  Filled 2017-05-02 (×8): qty 50

## 2017-05-02 MED ORDER — DORZOLAMIDE HCL-TIMOLOL MAL 2-0.5 % OP SOLN
1.0000 [drp] | Freq: Two times a day (BID) | OPHTHALMIC | Status: DC
  Administered 2017-05-02 – 2017-05-06 (×9): 1 [drp] via OPHTHALMIC
  Filled 2017-05-02 (×2): qty 10

## 2017-05-02 MED ORDER — PIPERACILLIN-TAZOBACTAM 3.375 G IVPB
3.3750 g | Freq: Once | INTRAVENOUS | Status: AC
  Administered 2017-05-02: 3.375 g via INTRAVENOUS
  Filled 2017-05-02: qty 50

## 2017-05-02 MED ORDER — TRAMADOL HCL 50 MG PO TABS
50.0000 mg | ORAL_TABLET | Freq: Four times a day (QID) | ORAL | Status: DC | PRN
  Administered 2017-05-04 – 2017-05-05 (×4): 50 mg via ORAL
  Filled 2017-05-02 (×4): qty 1

## 2017-05-02 MED ORDER — ONDANSETRON HCL 4 MG PO TABS: 4.0000 mg | ORAL_TABLET | Freq: Four times a day (QID) | ORAL | Status: DC | PRN

## 2017-05-02 MED ORDER — SENNOSIDES-DOCUSATE SODIUM 8.6-50 MG PO TABS: 1.0000 | ORAL_TABLET | Freq: Every evening | ORAL | Status: DC | PRN

## 2017-05-02 MED ORDER — SODIUM CHLORIDE 0.9% FLUSH: 3.0000 mL | INTRAVENOUS | Status: DC | PRN

## 2017-05-02 MED ORDER — ROPINIROLE HCL 0.25 MG PO TABS
0.2500 mg | ORAL_TABLET | Freq: Three times a day (TID) | ORAL | Status: DC
Start: 2017-05-02 — End: 2017-05-05
  Administered 2017-05-02 – 2017-05-05 (×8): 0.25 mg via ORAL
  Filled 2017-05-02 (×11): qty 1

## 2017-05-02 MED ORDER — ASPIRIN EC 81 MG PO TBEC
81.0000 mg | DELAYED_RELEASE_TABLET | Freq: Every day | ORAL | Status: DC
  Administered 2017-05-03 – 2017-05-06 (×4): 81 mg via ORAL
  Filled 2017-05-02 (×4): qty 1

## 2017-05-02 MED ORDER — CYANOCOBALAMIN 1000 MCG/ML IJ SOLN
1000.0000 ug | INTRAMUSCULAR | Status: DC
  Filled 2017-05-02: qty 1

## 2017-05-02 MED ORDER — ENOXAPARIN SODIUM 40 MG/0.4ML ~~LOC~~ SOLN
40.0000 mg | SUBCUTANEOUS | Status: DC
  Administered 2017-05-03 – 2017-05-05 (×3): 40 mg via SUBCUTANEOUS
  Filled 2017-05-02 (×3): qty 0.4

## 2017-05-02 MED ORDER — FERROUS SULFATE 325 (65 FE) MG PO TABS
325.0000 mg | ORAL_TABLET | Freq: Every day | ORAL | Status: DC
  Administered 2017-05-03 – 2017-05-06 (×4): 325 mg via ORAL
  Filled 2017-05-02 (×4): qty 1

## 2017-05-02 MED ORDER — ONDANSETRON HCL 4 MG/2ML IJ SOLN: 4.0000 mg | Freq: Four times a day (QID) | INTRAMUSCULAR | Status: DC | PRN

## 2017-05-02 MED ORDER — VANCOMYCIN HCL 10 G IV SOLR
1250.0000 mg | Freq: Two times a day (BID) | INTRAVENOUS | Status: DC
  Administered 2017-05-02 – 2017-05-05 (×6): 1250 mg via INTRAVENOUS
  Filled 2017-05-02 (×7): qty 1250

## 2017-05-02 MED ORDER — LATANOPROST 0.005 % OP SOLN
1.0000 [drp] | Freq: Every day | OPHTHALMIC | Status: DC
  Administered 2017-05-02 – 2017-05-05 (×4): 1 [drp] via OPHTHALMIC
  Filled 2017-05-02 (×2): qty 2.5

## 2017-05-02 MED ORDER — ACETAMINOPHEN 325 MG PO TABS
650.0000 mg | ORAL_TABLET | Freq: Four times a day (QID) | ORAL | Status: DC | PRN
  Administered 2017-05-02 – 2017-05-06 (×3): 650 mg via ORAL
  Filled 2017-05-02 (×3): qty 2

## 2017-05-02 MED ORDER — SODIUM CHLORIDE 0.9 % IV SOLN
250.0000 mL | INTRAVENOUS | Status: DC | PRN
  Administered 2017-05-04: 11:00:00 via INTRAVENOUS

## 2017-05-02 MED ORDER — PANTOPRAZOLE SODIUM 40 MG PO TBEC
40.0000 mg | DELAYED_RELEASE_TABLET | Freq: Every day | ORAL | Status: DC
  Administered 2017-05-03 – 2017-05-06 (×4): 40 mg via ORAL
  Filled 2017-05-02 (×4): qty 1

## 2017-05-02 MED ORDER — GABAPENTIN 100 MG PO CAPS
100.0000 mg | ORAL_CAPSULE | Freq: Three times a day (TID) | ORAL | Status: DC
  Administered 2017-05-02 – 2017-05-06 (×11): 100 mg via ORAL
  Filled 2017-05-02 (×11): qty 1

## 2017-05-02 NOTE — Progress Notes (Signed)
Patient is direct admit from Dr. Earney MalletFowlers office. Oriented to room, call bell, and staff. Bed in lowest position. Fall safety plan reviewed. Full assessment to Epic. Skin assessment verified with Micki Rileyiffany C, RN.  Will continue to monitor.

## 2017-05-02 NOTE — H&P (Signed)
[facility] [place] NAME: Jeremy Herrera    MR#:  [id number]  DATE OF BIRTH:  November 29, 1940  DATE OF ADMISSION:  05/02/2017  PRIMARY CARE PHYSICIAN: Sofie Hartigan, MD   REQUESTING/REFERRING PHYSICIAN:   CHIEF COMPLAINT:  Referred for direct admission by podiatry for osteomyelitis of foot  HISTORY OF PRESENT ILLNESS: Jeremy Herrera  is a 77 y.o. male with a known history of congestive heart failure, prostate hypertrophy, GERD, hyperlipidemia, hypertension presented to the emergency room after being referred from podiatry office for admission.  Patient has infection in the left foot which has been going on for the last couple of weeks.  He has purulent discharge from the base of the left toe with redness along with redness and swelling in the third left foot toe. Patient also had corn removed in base of left foot in September last year. Pain is aching in nature 4 out of 10 on a scale of 1-10.  Patient had an MRI of the left foot which shows osteomyelitis of third metatarsal and proximal phalanx of third toe in the left foot.  Patient has been referred for direct admission in the hospital for IV antibiotics and surgical debridement/amputation by podiatry on Friday.  No complaints of any chest pain, shortness of breath.  No headache dizziness and blurry vision.  PAST MEDICAL HISTORY:   Past Medical History:  Diagnosis Date  . Anemia   . BPH (benign prostatic hyperplasia)   . CHF (congestive heart failure) ([place])   . Dysphagia   . GERD (gastroesophageal reflux disease)   . HLD (hyperlipidemia)   . HTN (hypertension)   . Irregular heart beat   . Nocturia   . Urinary retention     PAST SURGICAL HISTORY:  Past Surgical History:  Procedure Laterality Date  . bladder patching  1964  . HIP SURGERY Right 2014  . IRRIGATION AND DEBRIDEMENT FOOT Left 11/14/2016   Procedure: IRRIGATION AND DEBRIDEMENT FOOT;  Surgeon: Samara Deist, DPM;   Location: [facility] ORS;  Service: Podiatry;  Laterality: Left;  . TRANSURETHRAL RESECTION OF PROSTATE      SOCIAL HISTORY:  Social History   Tobacco Use  . Smoking status: Never Smoker  . Smokeless tobacco: Never Used  Substance Use Topics  . Alcohol use: No    FAMILY HISTORY:  Family History  Problem Relation Age of Onset  . Ulcers Mother        stomach  . GER disease Sister        GERD  . Heart attack Father   . Diabetes Brother   . Depression Brother   . Kidney disease Neg Hx   . Prostate cancer Neg Hx   . Kidney cancer Neg Hx   . Bladder Cancer Neg Hx     DRUG ALLERGIES:  Allergies  Allergen Reactions  . Hydrocodone-Acetaminophen Nausea And Vomiting    REVIEW OF SYSTEMS:   CONSTITUTIONAL: No fever, fatigue or weakness.  EYES: No blurred or double vision.  EARS, NOSE, AND THROAT: No tinnitus or ear pain.  RESPIRATORY: No cough, shortness of breath, wheezing or hemoptysis.  CARDIOVASCULAR: No chest pain, orthopnea, edema.  GASTROINTESTINAL: No nausea, vomiting, diarrhea or abdominal pain.  GENITOURINARY: No dysuria, hematuria.  ENDOCRINE: No polyuria, nocturia,  HEMATOLOGY: No anemia, easy bruising or bleeding SKIN: redness of skin 2nd and 3 rd toe left foot MUSCULOSKELETAL: pain in left foot NEUROLOGIC: No tingling, numbness, weakness.  PSYCHIATRY: No anxiety or depression.  MEDICATIONS AT HOME:  Prior to Admission medications   Medication Sig Start Date End Date Taking? Authorizing Provider  acetaminophen (TYLENOL) 500 MG tablet Take by mouth.    [provider]  aspirin 81 MG tablet Take 81 mg by mouth daily.    [provider]  bimatoprost (LUMIGAN) 0.01 % SOLN Place 1 drop into both eyes 2 (two) times daily.     [provider]  Cyanocobalamin (B-12) 1000 MCG/ML KIT Inject 1 mL as directed every 30 (thirty) days.    [provider]  cyclobenzaprine (FLEXERIL) 10 MG tablet Take 1 tablet (10 mg total) by mouth 3 (three)  times daily as needed for muscle spasms. 04/12/16   Earleen Newport, MD  dorzolamide-timolol (COSOPT) 22.3-6.8 MG/ML ophthalmic solution Place 1 drop into both eyes 2 (two) times daily.     [provider]  esomeprazole (NEXIUM) 20 MG capsule Take 20 mg by mouth daily at 12 noon.    [provider]  ferrous sulfate 325 (65 FE) MG tablet Take 325 mg by mouth daily.    [provider]  furosemide (LASIX) 20 MG tablet Take 20 mg by mouth daily as needed for fluid. Try to limit to no more than 3-4 times per week. 11/23/14   [provider]  gabapentin (NEURONTIN) 100 MG capsule Take 1 capsule (100 mg total) by mouth 3 (three) times daily. 11/17/16   Gouru, Illene Silver, MD  rOPINIRole (REQUIP) 0.25 MG tablet Take 0.25 mg by mouth at bedtime as needed.  05/07/16 05/07/17  [provider]      PHYSICAL EXAMINATION:   VITAL SIGNS: Blood pressure (!) 164/88, pulse 88, temperature (!) 97.5 F (36.4 C), temperature source Oral, resp. rate 18, SpO2 100 %.  GENERAL:  76 y.o.-year-old patient lying in the bed with no acute distress.  EYES: Pupils equal, round, reactive to light and accommodation. No scleral icterus. Extraocular muscles intact.  HEENT: Head atraumatic, normocephalic. Oropharynx and nasopharynx clear.  NECK:  Supple, no jugular venous distention. No thyroid enlargement, no tenderness.  LUNGS: Normal breath sounds bilaterally, no wheezing, rales,rhonchi or crepitation. No use of accessory muscles of respiration.  CARDIOVASCULAR: S1, S2 normal. No murmurs, rubs, or gallops.  ABDOMEN: Soft, nontender, nondistended. Bowel sounds present. No organomegaly or mass.  EXTREMITIES: No pedal edema, cyanosis, or clubbing.  Purulent discharge and skin break down at base of 2nd toe left foot Swollen 3 rd toe left foot with swelling, redness. NEUROLOGIC: Cranial nerves II through XII are intact. Muscle strength 5/5 in all extremities. Sensation intact. Gait not  checked.  PSYCHIATRIC: The patient is alert and oriented x 3.  SKIN: break down of skin base of 2nd toe left foot with purulent discharge Redness of skin 3 rd toe  LABORATORY PANEL:   CBC No results for input(s): WBC, HGB, HCT, PLT, MCV, MCH, MCHC, RDW, LYMPHSABS, MONOABS, EOSABS, BASOSABS, BANDABS in the last 168 hours.  Invalid input(s): NEUTRABS, BANDSABD ------------------------------------------------------------------------------------------------------------------  Chemistries  No results for input(s): NA, K, CL, CO2, GLUCOSE, BUN, CREATININE, CALCIUM, MG, AST, ALT, ALKPHOS, BILITOT in the last 168 hours.  Invalid input(s): GFRCGP ------------------------------------------------------------------------------------------------------------------ CrCl cannot be calculated (Unknown ideal weight.). ------------------------------------------------------------------------------------------------------------------ No results for input(s): TSH, T4TOTAL, T3FREE, THYROIDAB in the last 72 hours.  Invalid input(s): FREET3   Coagulation profile No results for input(s): INR, PROTIME in the last 168 hours. ------------------------------------------------------------------------------------------------------------------- No results for input(s): DDIMER in the last 72 hours. -------------------------------------------------------------------------------------------------------------------  Cardiac Enzymes No results for input(s): CKMB,  TROPONINI, MYOGLOBIN in the last 168 hours.  Invalid input(s): CK ------------------------------------------------------------------------------------------------------------------ Invalid input(s): POCBNP  ---------------------------------------------------------------------------------------------------------------  Urinalysis    Component Value Date/Time   COLORURINE YELLOW (A) 12/18/2016 0935   APPEARANCEUR CLEAR (A) 12/18/2016 0935   APPEARANCEUR  Clear 04/02/2013 1415   LABSPEC 1.016 12/18/2016 0935   LABSPEC 1.015 04/02/2013 1415   PHURINE 6.0 12/18/2016 0935   GLUCOSEU NEGATIVE 12/18/2016 0935   GLUCOSEU Negative 04/02/2013 1415   HGBUR NEGATIVE 12/18/2016 0935   BILIRUBINUR NEGATIVE 12/18/2016 0935   BILIRUBINUR Negative 04/02/2013 1415   KETONESUR NEGATIVE 12/18/2016 0935   PROTEINUR NEGATIVE 12/18/2016 0935   NITRITE NEGATIVE 12/18/2016 0935   LEUKOCYTESUR NEGATIVE 12/18/2016 0935   LEUKOCYTESUR Negative 04/02/2013 1415     RADIOLOGY: No results found.  EKG: Orders placed or performed during the hospital encounter of 12/18/16  . ED EKG  . ED EKG  . EKG 12-Lead  . EKG 12-Lead  . ED EKG  . ED EKG    IMPRESSION AND PLAN:  77 year old male patient with history of hypertension, hyperlipidemia, prostate hypertrophy, heart failure was referred from podiatry office for infection in the left foot.  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  Left foot pain Tylenol and as needed tramadol for pain  3. Hypertension. Stable  4.  DVT prophylaxis Subcu Lovenox 40 daily  All the records are reviewed and case discussed with ED provider. Management plans discussed with the patient, family and they are in agreement.  CODE STATUS:Full code Code Status History    Date Active Date Inactive Code Status Order ID Comments User Context   12/18/2016 17:05 12/19/2016 18:46 Full Code [id number]  Bettey Costa, MD Inpatient   11/13/2016 13:37 11/17/2016 19:19 Full Code [id number]  [name], MD Inpatient       TOTAL TIME TAKING CARE OF THIS PATIENT: 53 minutes.    Saundra Shelling M.D on 05/02/2017 at 12:32 PM  Between 7am to 6pm - Pager - [phone]  After 6pm go to [email or web address] - password [email or web address] [facility]  [place] [facility]  Office   [phone]  CC: Primary care physician; Sofie Hartigan, MD

## 2017-05-02 NOTE — Progress Notes (Signed)
Pharmacy Antibiotic Note  Jeremy QuinonesRichard L Simeone is a 77 y.o. male admitted on 05/02/2017 with osteomyelitis.  Pharmacy has been consulted for Vancomycin and Zosyn dosing.  Plan: Zosyn 3.375g IV q8h (4 hour infusion).  Vancomycin 1 gram IV x 1 given. Will continue with Vancomycin 1250 mg IV q12h with stacked dosing. Will check level prior to 5th dose. Ke 0.058  T1/2 11.95  Vd 67.6      Height: 6\' 1"  (185.4 cm) Weight: 212 lb 15.4 oz (96.6 kg) IBW/kg (Calculated) : 79.9  Temp (24hrs), Avg:97.6 F (36.4 C), Min:97.5 F (36.4 C), Max:97.7 F (36.5 C)  Recent Labs  Lab 05/02/17 1253  WBC 5.6  CREATININE 1.09    Estimated Creatinine Clearance: 68.4 mL/min (by C-G formula based on SCr of 1.09 mg/dL).    Allergies  Allergen Reactions  . Hydrocodone-Acetaminophen Nausea And Vomiting    Antimicrobials this admission: zosyn 3/13 >>   vanc 3/13 >>    Dose adjustments this admission:    Microbiology results:   BCx:     UCx:       Sputum:      MRSA PCR:    Thank you for allowing pharmacy to be a part of this patient's care.  Boss Danielsen A 05/02/2017 4:11 PM

## 2017-05-03 ENCOUNTER — Inpatient Hospital Stay: Payer: Self-pay

## 2017-05-03 LAB — BASIC METABOLIC PANEL
Anion gap: 7 (ref 5–15)
BUN: 20 mg/dL (ref 6–20)
CHLORIDE: 110 mmol/L (ref 101–111)
CO2: 23 mmol/L (ref 22–32)
CREATININE: 1.02 mg/dL (ref 0.61–1.24)
Calcium: 8.5 mg/dL — ABNORMAL LOW (ref 8.9–10.3)
GFR calc Af Amer: >60 mL/min (ref >60)
GFR calc non Af Amer: >60 mL/min (ref >60)
GLUCOSE: 103 mg/dL — AB (ref 65–99)
Potassium: 4.1 mmol/L (ref 3.5–5.1)
Sodium: 140 mmol/L (ref 135–145)

## 2017-05-03 LAB — CBC
HCT: 36.5 % — ABNORMAL LOW (ref 40.0–52.0)
Hemoglobin: 12.5 g/dL — ABNORMAL LOW (ref 13.0–18.0)
MCH: 30.2 pg (ref 26.0–34.0)
MCHC: 34.2 g/dL (ref 32.0–36.0)
MCV: 88.4 fL (ref 80.0–100.0)
PLATELETS: 138 10*3/uL — AB (ref 150–440)
RBC: 4.13 MIL/uL — ABNORMAL LOW (ref 4.40–5.90)
RDW: 13.8 % (ref 11.5–14.5)
WBC: 4.2 10*3/uL (ref 3.8–10.6)

## 2017-05-03 LAB — SURGICAL PCR SCREEN
MRSA, PCR: NEGATIVE
Staphylococcus aureus: NEGATIVE

## 2017-05-03 LAB — PROTIME-INR
INR: 1.04
Prothrombin Time: 13.5 seconds (ref 11.4–15.2)

## 2017-05-03 MED ORDER — SODIUM CHLORIDE 0.9% FLUSH
10.0000 mL | INTRAVENOUS | Status: DC | PRN
  Administered 2017-05-05: 10 mL
  Filled 2017-05-03: qty 40

## 2017-05-03 MED ORDER — CEFAZOLIN SODIUM-DEXTROSE 2-4 GM/100ML-% IV SOLN
2.0000 g | INTRAVENOUS | Status: AC
  Administered 2017-05-04: 2 g via INTRAVENOUS
  Filled 2017-05-03: qty 100

## 2017-05-03 MED ORDER — MUPIROCIN 2 % EX OINT
1.0000 "application " | TOPICAL_OINTMENT | Freq: Two times a day (BID) | CUTANEOUS | Status: DC
  Filled 2017-05-03: qty 22

## 2017-05-03 MED ORDER — SODIUM CHLORIDE 0.9% FLUSH
10.0000 mL | Freq: Two times a day (BID) | INTRAVENOUS | Status: DC
  Administered 2017-05-03 – 2017-05-05 (×5): 10 mL
  Administered 2017-05-06: 13 mL

## 2017-05-03 NOTE — Progress Notes (Signed)
ID E note  77 yo with L foot infection with MRI evidence of osteomyeltis.   Plan for surgical debridement tomorrow. Culture pending Prior cx nml skin flora, staph aureus and bacteroides.  ESR 49, CRP 0.8  Rec  Would place picc and plan on likely 4 weeks coverage with IV abx Further abx selection based on cx results.

## 2017-05-03 NOTE — Care Management (Signed)
Received report from primary nurse that patient will have toe amputation on Friday. He is to have PICC placed today with plan of home with IV antibiotics.. [facility]

## 2017-05-03 NOTE — Care Management (Signed)
Spoke with patient regarding discharge plan. He plans to return to his home after discharge. He has previously stayed at his daughters home. It is his goal to go to his home this time. Patient adamant that he does not want SNF. He lives alone. Will update CSW.

## 2017-05-03 NOTE — Care Management (Signed)
RNCM has made multiple attempts to speak with patient and he is getting PICC line placed. Will reassess at a later time.

## 2017-05-03 NOTE — Consult Note (Signed)
ORTHOPAEDIC CONSULTATION  REQUESTING PHYSICIAN: Henreitta Leber, MD  Chief Complaint: Left foot infection  HPI: Jeremy Herrera is a 77 y.o. male who complains of chronic left foot swelling and drainage.  Patient underwent surgical debridement several months ago by myself for abscess to the area.  Seem to improve until a couple months ago he had noted redness and swelling again to his left forefoot.  Followed in the outpatient clinic and recent x-ray performed last week showed destructive changes of the third toe and metatarsal phalangeal joint region.  MRI confirmed osteomyelitis of the left foot.  Admitted for IV antibiotics and surgical debridement.  Denies any fever or chills.  Pain to the left forefoot.  Past Medical History:  Diagnosis Date  . Anemia   . BPH (benign prostatic hyperplasia)   . CHF (congestive heart failure) ([place])   . Dysphagia   . GERD (gastroesophageal reflux disease)   . HLD (hyperlipidemia)   . HTN (hypertension)   . Irregular heart beat   . Nocturia   . Urinary retention    Past Surgical History:  Procedure Laterality Date  . bladder patching  1964  . HIP SURGERY Right 2014  . IRRIGATION AND DEBRIDEMENT FOOT Left 11/14/2016   Procedure: IRRIGATION AND DEBRIDEMENT FOOT;  Surgeon: Samara Deist, DPM;  Location: [facility] ORS;  Service: Podiatry;  Laterality: Left;  . TRANSURETHRAL RESECTION OF PROSTATE     Social History   Socioeconomic History  . Marital status: Widowed    Spouse name: Not on file  . Number of children: Not on file  . Years of education: Not on file  . Highest education level: Not on file  Social Needs  . Financial resource strain: Not on file  . Food insecurity - worry: Not on file  . Food insecurity - inability: Not on file  . Transportation needs - medical: Not on file  . Transportation needs - non-medical: Not on file  Occupational History  . Not on file  Tobacco Use  . Smoking status: Never Smoker  . Smokeless tobacco:  Never Used  Substance and Sexual Activity  . Alcohol use: No  . Drug use: No  . Sexual activity: Not Currently  Other Topics Concern  . Not on file  Social History Narrative  . Not on file   Family History  Problem Relation Age of Onset  . Ulcers Mother        stomach  . GER disease Sister        GERD  . Heart attack Father   . Diabetes Brother   . Depression Brother   . Kidney disease Neg Hx   . Prostate cancer Neg Hx   . Kidney cancer Neg Hx   . Bladder Cancer Neg Hx    Allergies  Allergen Reactions  . Hydrocodone-Acetaminophen Nausea And Vomiting   Prior to Admission medications   Medication Sig Start Date End Date Taking? Authorizing Provider  aspirin 81 MG tablet Take 81 mg by mouth daily.   Yes [provider]  bimatoprost (LUMIGAN) 0.01 % SOLN Place 1 drop into both eyes 2 (two) times daily.    Yes [provider]  Cyanocobalamin (B-12) 1000 MCG/ML KIT Inject 1 mL as directed every 30 (thirty) days.   Yes [provider]  dorzolamide-timolol (COSOPT) 22.3-6.8 MG/ML ophthalmic solution Place 1 drop into both eyes 2 (two) times daily.    Yes [provider]  esomeprazole (NEXIUM) 20 MG capsule Take 20 mg by  mouth daily at 12 noon.   Yes [provider]  ferrous sulfate 325 (65 FE) MG tablet Take 325 mg by mouth daily.   Yes [provider]  gabapentin (NEURONTIN) 100 MG capsule Take 1 capsule (100 mg total) by mouth 3 (three) times daily. 11/17/16  Yes Gouru, Illene Silver, MD  acetaminophen (TYLENOL) 500 MG tablet Take by mouth.    [provider]  cyclobenzaprine (FLEXERIL) 10 MG tablet Take 1 tablet (10 mg total) by mouth 3 (three) times daily as needed for muscle spasms. Patient not taking: Reported on 05/02/2017 04/12/16   Earleen Newport, MD  furosemide (LASIX) 20 MG tablet Take 20 mg by mouth daily as needed for fluid. Try to limit to no more than 3-4 times per week. 11/23/14   [provider]   No  results found.  Positive ROS: All other systems have been reviewed and were otherwise negative with the exception of those mentioned in the HPI and as above.  12 point ROS was performed.  Physical Exam: General: Alert and oriented.  No apparent distress.  Vascular:  Left foot:Dorsalis Pedis:  present Posterior Tibial:  present  Right foot: Dorsalis Pedis:  present Posterior Tibial:  present  Neuro:absent protective sensation.  Patient has idiopathic neuropathy.  Derm: Open ulceration on the medial aspect of the left third toe with scant purulent drainage.  Noted surrounding erythema.  No other ulceration at this time.  Ortho/MS: Diffuse edema to the left forefoot.  Outpatient x-ray shows erosive change with pathologic fracture on the base of the third toe.  MRI consistent with osteomyelitis.  MRI: IMPRESSION: 1. Osteomyelitis of the third metatarsal and proximal phalanx of the third toe. 2. Prominent abscess surrounding the third MTP joint and proximal phalanx of the third toe.   Assessment: Osteomyelitis third toe and metatarsal Idiopathic neuropathy History of diabetes though patient has not been on any diabetic medication in many years.  Plan: Plan for surgical debridement and amputation of the left third toe and metatarsal tomorrow.  Discussed with patient and consent has been given.  N.p.o. after midnight.  Will order PICC line for planned IV antibiotics.  Consult infectious disease.  Deep wound culture was performed today.  Have been unable to obtain any organism in the past.    [name], [place] Cell [phone]   05/03/2017 7:47 AM

## 2017-05-03 NOTE — Progress Notes (Signed)
[facility] [place] [facility]   PATIENT NAME: Jaynie CollinsRichard Akre    MR#:  [id number]  DATE OF BIRTH:  02/21/1940  SUBJECTIVE:   Patient presented to the hospital due to left foot osteomyelitis. Currently on broad-spectrum IV antibiotics, plan for debridement tomorrow as per podiatry. Will need long-term treatment with IV antibiotics and plan for PICC line today.  REVIEW OF SYSTEMS:    Review of Systems  Constitutional: Negative for chills and fever.  HENT: Negative for congestion and tinnitus.   Eyes: Negative for blurred vision and double vision.  Respiratory: Negative for cough, shortness of breath and wheezing.   Cardiovascular: Negative for chest pain, orthopnea and PND.  Gastrointestinal: Negative for abdominal pain, diarrhea, nausea and vomiting.  Genitourinary: Negative for dysuria and hematuria.  Neurological: Negative for dizziness, sensory change and focal weakness.  All other systems reviewed and are negative.  Nutrition: Heart healthy Tolerating Diet: Yes Tolerating PT: Await Eval.   DRUG ALLERGIES:   Allergies  Allergen Reactions  . Hydrocodone-Acetaminophen Nausea And Vomiting    VITALS:  Blood pressure 131/74, pulse 70, temperature 97.6 F (36.4 C), temperature source Oral, resp. rate 18, height 6\' 1"  (1.854 m), weight 96.6 kg (212 lb 15.4 oz), SpO2 100 %.  PHYSICAL EXAMINATION:   Physical Exam  GENERAL:  77 y.o.-year-old patient lying in bed in no acute distress.  EYES: Pupils equal, round, reactive to light and accommodation. No scleral icterus. Extraocular muscles intact.  HEENT: Head atraumatic, normocephalic. Oropharynx and nasopharynx clear.  NECK:  Supple, no jugular venous distention. No thyroid enlargement, no tenderness.  LUNGS: Normal breath sounds bilaterally, no wheezing, rales, rhonchi. No use of accessory muscles of respiration.  CARDIOVASCULAR: S1, S2 normal. No murmurs, rubs, or gallops.  ABDOMEN: Soft, nontender,  nondistended. Bowel sounds present. No organomegaly or mass.  EXTREMITIES: No cyanosis, clubbing or edema b/l.   Dressing in place to the left foot from his ulcer with no acute drainage noted NEUROLOGIC: Cranial nerves II through XII are intact. No focal Motor or sensory deficits b/l.   PSYCHIATRIC: The patient is alert and oriented x 3.  SKIN: No obvious rash, lesion, left foot plantar diabetic foot ulcer with underlying osteomyelitis.   LABORATORY PANEL:   CBC Recent Labs  Lab 05/03/17 0439  WBC 4.2  HGB 12.5*  HCT 36.5*  PLT 138*   ------------------------------------------------------------------------------------------------------------------  Chemistries  Recent Labs  Lab 05/02/17 1253 05/03/17 0439  NA 140 140  K 4.9 4.1  CL 110 110  CO2 21* 23  GLUCOSE 151* 103*  BUN 19 20  CREATININE 1.09 1.02  CALCIUM 8.6* 8.5*  AST 18  --   ALT 11*  --   ALKPHOS 122  --   BILITOT 0.6  --    ------------------------------------------------------------------------------------------------------------------  Cardiac Enzymes No results for input(s): TROPONINI in the last 168 hours. ------------------------------------------------------------------------------------------------------------------  RADIOLOGY:  [place] Ekg Site Rite  Result Date: 05/03/2017 If Site Rite image not attached, placement could not be confirmed due to current cardiac rhythm.    ASSESSMENT AND PLAN:   77 year old male with past medical history of hypertension, hyperlipidemia, GERD, history of CHF, BPH who presented to the hospital due to left foot pain and swelling and noted to have left foot osteomyelitis.  1. Left foot osteomyelitis-continue broad-spectrum IV antibiotics with vancomycin, Zosyn. -Appreciate podiatry input and patient likely to have debridement and amputation of his left third toe and metatarsal tomorrow. -Continue supportive care for now. -Patient will also need  long-term IV  antibiotics and PICC line to be placed today. Await infectious disease input.  2. Glaucoma-continue latanoprost, dorzolamide and timolol eyedrops.  3. Neuropathy-continue gabapentin.  4. GERD-continue Protonix.  5. Restless leg syndrome-continue Requip.   All the records are reviewed and case discussed with Care Management/Social Worker. Management plans discussed with the patient, family and they are in agreement.  CODE STATUS: Full code  DVT Prophylaxis: Lovenox  TOTAL TIME TAKING CARE OF THIS PATIENT: 30 minutes.   POSSIBLE D/C IN 1-2 DAYS, DEPENDING ON CLINICAL CONDITION.   Houston Siren M.D on 05/03/2017 at 1:52 PM  Between 7am to 6pm - Pager - [phone]  After 6pm go to [email or web address] - Social research officer, government  [facility] [place] [facility]  Office  [phone]  CC: Primary care physician; Marina Goodell, MD

## 2017-05-03 NOTE — Progress Notes (Signed)
Peripherally Inserted Central Catheter/Midline Placement  The IV Nurse has discussed with the patient and/or persons authorized to consent for the patient, the purpose of this procedure and the potential benefits and risks involved with this procedure.  The benefits include less needle sticks, lab draws from the catheter, and the patient may be discharged home with the catheter. Risks include, but not limited to, infection, bleeding, blood clot (thrombus formation), and puncture of an artery; nerve damage and irregular heartbeat and possibility to perform a PICC exchange if needed/ordered by physician.  Alternatives to this procedure were also discussed.  Bard Power PICC patient education guide, fact sheet on infection prevention and patient information card has been provided to patient /or left at bedside.    PICC/Midline Placement Documentation  PICC Single Lumen 05/03/17 PICC Right Basilic 40 cm 0 cm (Active)  Indication for Insertion or Continuance of Line Home intravenous therapies (PICC only) 05/03/2017  3:00 PM  Exposed Catheter (cm) 0 cm 05/03/2017  3:00 PM  Site Assessment Clean;Dry;Intact 05/03/2017  3:00 PM  Line Status Flushed;Saline locked;Blood return noted 05/03/2017  3:00 PM  Dressing Type Transparent;Securing device 05/03/2017  3:00 PM  Dressing Status Clean;Dry;Intact;Antimicrobial disc in place 05/03/2017  3:00 PM  Dressing Change Due 05/10/17 05/03/2017  3:00 PM       Romie Jumperlford, Ansel Ferrall Terry 05/03/2017, 3:33 PM

## 2017-05-04 ENCOUNTER — Inpatient Hospital Stay: Payer: Medicare PPO | Admitting: Anesthesiology

## 2017-05-04 ENCOUNTER — Encounter
Admission: AD | Disposition: A | Discharge status: Home Health | Payer: Self-pay | Source: Ambulatory Visit | Attending: Specialist

## 2017-05-04 DIAGNOSIS — I4891 Unspecified atrial fibrillation: Secondary | ICD-10-CM

## 2017-05-04 DIAGNOSIS — Z01810 Encounter for preprocedural cardiovascular examination: Secondary | ICD-10-CM

## 2017-05-04 HISTORY — PX: AMPUTATION TOE: SHX6595

## 2017-05-04 LAB — VANCOMYCIN, TROUGH: Vancomycin Tr: 19 ug/mL (ref 15–20)

## 2017-05-04 SURGERY — AMPUTATION, TOE
Anesthesia: General | Site: Toe | Laterality: Left | Wound class: Dirty or Infected

## 2017-05-04 MED ORDER — LIDOCAINE HCL (PF) 1 % IJ SOLN
INTRAMUSCULAR | Status: AC
  Filled 2017-05-04: qty 30

## 2017-05-04 MED ORDER — POVIDONE-IODINE 7.5 % EX SOLN
Freq: Once | CUTANEOUS | Status: DC
  Filled 2017-05-04: qty 118

## 2017-05-04 MED ORDER — SODIUM CHLORIDE 0.9 % IV SOLN
INTRAVENOUS | Status: DC
  Administered 2017-05-04: 13:00:00 via INTRAVENOUS

## 2017-05-04 MED ORDER — SODIUM CHLORIDE 0.9 % IV SOLN
INTRAVENOUS | Status: DC | PRN
  Administered 2017-05-04: 25 ug/min via INTRAVENOUS

## 2017-05-04 MED ORDER — PHENYLEPHRINE HCL 10 MG/ML IJ SOLN
INTRAMUSCULAR | Status: DC | PRN
  Administered 2017-05-04 (×2): 200 ug via INTRAVENOUS

## 2017-05-04 MED ORDER — HYDROMORPHONE HCL 1 MG/ML IJ SOLN
0.5000 mg | Freq: Once | INTRAMUSCULAR | Status: AC
  Administered 2017-05-04: 0.5 mg via INTRAVENOUS
  Filled 2017-05-04: qty 1

## 2017-05-04 MED ORDER — PROPOFOL 10 MG/ML IV BOLUS
INTRAVENOUS | Status: AC
  Filled 2017-05-04: qty 20

## 2017-05-04 MED ORDER — FENTANYL CITRATE (PF) 100 MCG/2ML IJ SOLN
INTRAMUSCULAR | Status: DC | PRN
  Administered 2017-05-04 (×2): 25 ug via INTRAVENOUS
  Administered 2017-05-04: 50 ug via INTRAVENOUS

## 2017-05-04 MED ORDER — LIDOCAINE HCL (PF) 1 % IJ SOLN
INTRAMUSCULAR | Status: DC | PRN
  Administered 2017-05-04: 5 mL

## 2017-05-04 MED ORDER — MICROFIBRILLAR COLL HEMOSTAT EX PADS
MEDICATED_PAD | CUTANEOUS | Status: DC | PRN
  Administered 2017-05-04: 1 via TOPICAL

## 2017-05-04 MED ORDER — BUPIVACAINE HCL (PF) 0.5 % IJ SOLN
INTRAMUSCULAR | Status: AC
  Filled 2017-05-04: qty 30

## 2017-05-04 MED ORDER — PROPOFOL 10 MG/ML IV BOLUS
INTRAVENOUS | Status: DC | PRN
  Administered 2017-05-04: 150 mg via INTRAVENOUS

## 2017-05-04 MED ORDER — FENTANYL CITRATE (PF) 100 MCG/2ML IJ SOLN
INTRAMUSCULAR | Status: AC
  Filled 2017-05-04: qty 2

## 2017-05-04 MED ORDER — LACTATED RINGERS IV SOLN: INTRAVENOUS | Status: DC | PRN

## 2017-05-04 MED ORDER — BUPIVACAINE HCL 0.5 % IJ SOLN
INTRAMUSCULAR | Status: DC | PRN
  Administered 2017-05-04: 5 mL

## 2017-05-04 MED ORDER — FENTANYL CITRATE (PF) 100 MCG/2ML IJ SOLN: 25.0000 ug | INTRAMUSCULAR | Status: DC | PRN

## 2017-05-04 SURGICAL SUPPLY — 37 items
BANDAGE ELASTIC 4 LF NS (GAUZE/BANDAGES/DRESSINGS) ×2 IMPLANT
BLADE OSC/SAGITTAL 5.5X25 (BLADE) ×2 IMPLANT
BLADE OSC/SAGITTAL MD 5.5X18 (BLADE) IMPLANT
BLADE SURG MINI STRL (BLADE) ×2 IMPLANT
BNDG CONFORM 3 STRL LF (GAUZE/BANDAGES/DRESSINGS) ×2 IMPLANT
BNDG ESMARK 4X12 TAN STRL LF (GAUZE/BANDAGES/DRESSINGS) ×2 IMPLANT
BNDG GAUZE 4.5X4.1 6PLY STRL (MISCELLANEOUS) ×2 IMPLANT
CANISTER SUCT 1200ML W/VALVE (MISCELLANEOUS) ×2 IMPLANT
DRAPE FLUOR MINI C-ARM 54X84 (DRAPES) ×2 IMPLANT
DURAPREP 26ML APPLICATOR (WOUND CARE) ×2 IMPLANT
ELECT REM PT RETURN 9FT ADLT (ELECTROSURGICAL) ×2
ELECTRODE REM PT RTRN 9FT ADLT (ELECTROSURGICAL) ×1 IMPLANT
GAUZE PACKING IODOFORM 1/2 (PACKING) ×2 IMPLANT
GAUZE PETRO XEROFOAM 1X8 (MISCELLANEOUS) ×2 IMPLANT
GAUZE SPONGE 4X4 12PLY STRL (GAUZE/BANDAGES/DRESSINGS) ×2 IMPLANT
GAUZE STRETCH 2X75IN STRL (MISCELLANEOUS) ×2 IMPLANT
GLOVE BIO SURGEON STRL SZ7.5 (GLOVE) ×6 IMPLANT
GLOVE INDICATOR 8.0 STRL GRN (GLOVE) ×6 IMPLANT
GOWN STRL REUS W/ TWL LRG LVL3 (GOWN DISPOSABLE) ×3 IMPLANT
GOWN STRL REUS W/TWL LRG LVL3 (GOWN DISPOSABLE) ×3
KIT TURNOVER KIT A (KITS) ×2 IMPLANT
NEEDLE FILTER BLUNT 18X 1/2SAF (NEEDLE) ×1
NEEDLE FILTER BLUNT 18X1 1/2 (NEEDLE) ×1 IMPLANT
NEEDLE HYPO 25X1 1.5 SAFETY (NEEDLE) ×2 IMPLANT
NS IRRIG 500ML POUR BTL (IV SOLUTION) ×2 IMPLANT
PACK EXTREMITY ARMC (MISCELLANEOUS) ×2 IMPLANT
PAD ABD DERMACEA PRESS 5X9 (GAUZE/BANDAGES/DRESSINGS) ×4 IMPLANT
STOCKINETTE M/LG 89821 (MISCELLANEOUS) ×2 IMPLANT
STRAP SAFETY 5IN WIDE (MISCELLANEOUS) ×2 IMPLANT
SUT ETHILON 3-0 FS-10 30 BLK (SUTURE) ×2
SUT ETHILON 4-0 (SUTURE) ×1
SUT ETHILON 4-0 FS2 18XMFL BLK (SUTURE) ×1
SUT ETHILON 5-0 FS-2 18 BLK (SUTURE) IMPLANT
SUT VIC AB 4-0 FS2 27 (SUTURE) ×2 IMPLANT
SUTURE EHLN 3-0 FS-10 30 BLK (SUTURE) ×1 IMPLANT
SUTURE ETHLN 4-0 FS2 18XMF BLK (SUTURE) ×1 IMPLANT
SYR 10ML LL (SYRINGE) ×6 IMPLANT

## 2017-05-04 NOTE — Progress Notes (Signed)
Pharmacy Antibiotic Note  Jeremy QuinonesRichard L Kersh is a 77 y.o. male admitted on 05/02/2017 with osteomyelitis.  Pharmacy has been consulted for Vancomycin and Zosyn dosing.  Plan: Zosyn 3.375g IV q8h (4 hour infusion).  Vancomycin trough resulted at 19 (goal 15-20) Continue current dose of 1250mg  q 12 hr Will check another level prior to d/c if pt is going to go home on vanc Follow up cx    Height: 6\' 1"  (185.4 cm) Weight: 211 lb 10.3 oz (96 kg) IBW/kg (Calculated) : 79.9  Temp (24hrs), Avg:97.8 F (36.6 C), Min:97.5 F (36.4 C), Max:97.9 F (36.6 C)  Recent Labs  Lab 05/02/17 1253 05/03/17 0439 05/04/17 0748  WBC 5.6 4.2  --   CREATININE 1.09 1.02  --   VANCOTROUGH  --   --  19    Estimated Creatinine Clearance: 72.9 mL/min (by C-G formula based on SCr of 1.02 mg/dL).    Allergies  Allergen Reactions  . Hydrocodone-Acetaminophen Nausea And Vomiting    Antimicrobials this admission: zosyn 3/13 >>   vanc 3/13 >>    Dose adjustments this admission:    Microbiology results: Wound: GPC  Thank you for allowing pharmacy to be a part of this patient's care.  Olene FlossMelissa D Lakeeta Dobosz, Pharm.D, BCPS Clinical Pharmacist  05/04/2017 9:32 AM

## 2017-05-04 NOTE — Progress Notes (Signed)
[facility] [place] [facility]   PATIENT NAME: Jeremy Herrera    MR#:  [id number]  DATE OF BIRTH:  [birth year]  SUBJECTIVE:   Patient presented to the hospital due to left foot osteomyelitis. Patient is status post partial left third toe amputation and debridement of his wound today.Status post PICC line placement yesterday. he denies any other complaints presently.  REVIEW OF SYSTEMS:    Review of Systems  Constitutional: Negative for chills and fever.  HENT: Negative for congestion and tinnitus.   Eyes: Negative for blurred vision and double vision.  Respiratory: Negative for cough, shortness of breath and wheezing.   Cardiovascular: Negative for chest pain, orthopnea and PND.  Gastrointestinal: Negative for abdominal pain, diarrhea, nausea and vomiting.  Genitourinary: Negative for dysuria and hematuria.  Neurological: Negative for dizziness, sensory change and focal weakness.  All other systems reviewed and are negative.  Nutrition: Heart healthy Tolerating Diet: Yes Tolerating PT: Await Eval.   DRUG ALLERGIES:   Allergies  Allergen Reactions  . Hydrocodone-Acetaminophen Nausea And Vomiting    VITALS:  Blood pressure 119/80, pulse 67, temperature 97.7 F (36.5 C), resp. rate 15, height 6\' 1"  (1.854 m), weight 96 kg (211 lb 10.3 oz), SpO2 99 %.  PHYSICAL EXAMINATION:   Physical Exam  GENERAL:  77 y.o.-year-old patient lying in bed in no acute distress.  EYES: Pupils equal, round, reactive to light and accommodation. No scleral icterus. Extraocular muscles intact.  HEENT: Head atraumatic, normocephalic. Oropharynx and nasopharynx clear.  NECK:  Supple, no jugular venous distention. No thyroid enlargement, no tenderness.  LUNGS: Normal breath sounds bilaterally, no wheezing, rales, rhonchi. No use of accessory muscles of respiration.  CARDIOVASCULAR: S1, S2 normal. No murmurs, rubs, or gallops.  ABDOMEN: Soft, nontender, nondistended. Bowel sounds  present. No organomegaly or mass.  EXTREMITIES: No cyanosis, clubbing or edema b/l.   Dressing in place to the left foot from his ulcer with no acute drainage noted NEUROLOGIC: Cranial nerves II through XII are intact. No focal Motor or sensory deficits b/l.   PSYCHIATRIC: The patient is alert and oriented x 3.  SKIN: No obvious rash, lesion, left foot plantar diabetic foot ulcer with underlying osteomyelitis.   LABORATORY PANEL:   CBC Recent Labs  Lab 05/03/17 0439  WBC 4.2  HGB 12.5*  HCT 36.5*  PLT 138*   ------------------------------------------------------------------------------------------------------------------  Chemistries  Recent Labs  Lab 05/02/17 1253 05/03/17 0439  NA 140 140  K 4.9 4.1  CL 110 110  CO2 21* 23  GLUCOSE 151* 103*  BUN 19 20  CREATININE 1.09 1.02  CALCIUM 8.6* 8.5*  AST 18  --   ALT 11*  --   ALKPHOS 122  --   BILITOT 0.6  --    ------------------------------------------------------------------------------------------------------------------  Cardiac Enzymes No results for input(s): TROPONINI in the last 168 hours. ------------------------------------------------------------------------------------------------------------------  RADIOLOGY:  [place] Ekg Site Rite  Result Date: 05/03/2017 If Site Rite image not attached, placement could not be confirmed due to current cardiac rhythm.    ASSESSMENT AND PLAN:   77 year old male with past medical history of hypertension, hyperlipidemia, GERD, history of CHF, BPH who presented to the hospital due to left foot pain and swelling and noted to have left foot osteomyelitis.  1. Left foot osteomyelitis-continue broad-spectrum IV antibiotics with vancomycin, Zosyn. -Appreciate podiatry input patient is status post left third toe amputation with metatarsal amputation and debridement of the wound today. Await intraoperative culture results. -Patient will also need long-term IV  antibiotics and PICC  line placed yesterday - Appreciate ID input and will need 4 weeks of IV abx and target abx once intraoperative cultures are back.  2. Glaucoma-continue latanoprost, dorzolamide and timolol eyedrops.  3. Neuropathy-continue gabapentin.  4. GERD-continue Protonix.  5. Restless leg syndrome-continue Requip.   All the records are reviewed and case discussed with Care Management/Social Worker. Management plans discussed with the patient, family and they are in agreement.  CODE STATUS: Full code  DVT Prophylaxis: Lovenox  TOTAL TIME TAKING CARE OF THIS PATIENT: 25 minutes.   POSSIBLE D/C IN 1-2 DAYS, DEPENDING ON CLINICAL CONDITION.   Houston SirenSAINANI,VIVEK J M.D on 05/04/2017 at 2:33 PM  Between 7am to 6pm - Pager - [phone]  After 6pm go to [email or web address] - Social research officer, governmentpassword [email or web address] [email or web address]  [facility] [place] [facility]  Office  [phone]  CC: Primary care physician; Marina GoodellFeldpausch, Dale E, MD

## 2017-05-04 NOTE — Consult Note (Signed)
[facility] Infectious Disease     Reason for Consult: Osteomyelitis   Referring Physician: Jeronimo Greaves Date of Admission:  05/02/2017   Active Problems:   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[place])  HPI: Jeremy Herrera is a 77 y.o. male with L foot third toe osteomyelitis. Prior cx nml skin flora, staph aureus (no sensis I can find) and bacteroides.  This admission ESR 49 (was 90 on 2/25)  , CRP 0.8. MRSA PCR neg.  On 3/15  Partial third ray amputation left third toe and metatarsal Culture pending but GS with GPC    Past Medical History:  Diagnosis Date  . Anemia   . BPH (benign prostatic hyperplasia)   . CHF (congestive heart failure) ([place])   . Dysphagia   . GERD (gastroesophageal reflux disease)   . HLD (hyperlipidemia)   . HTN (hypertension)   . Irregular heart beat   . Nocturia   . Urinary retention    Past Surgical History:  Procedure Laterality Date  . bladder patching  1964  . HIP SURGERY Right 2014  . IRRIGATION AND DEBRIDEMENT FOOT Left 11/14/2016   Procedure: IRRIGATION AND DEBRIDEMENT FOOT;  Surgeon: Samara Deist, DPM;  Location: [facility] ORS;  Service: Podiatry;  Laterality: Left;  . TRANSURETHRAL RESECTION OF PROSTATE     Social History   Tobacco Use  . Smoking status: Never Smoker  . Smokeless tobacco: Never Used  Substance Use Topics  . Alcohol use: No  . Drug use: No   Family History  Problem Relation Age of Onset  . Ulcers Mother        stomach  . GER disease Sister        GERD  . Heart attack Father   . Diabetes Brother   . Depression Brother   . Kidney disease Neg Hx   . Prostate cancer Neg Hx   . Kidney cancer Neg Hx   . Bladder Cancer Neg Hx     Allergies:  Allergies  Allergen Reactions  . Hydrocodone-Acetaminophen Nausea And Vomiting    Current antibiotics: Antibiotics Given (last 72 hours)    Date/Time Action Medication Dose Rate   05/02/17 1415 New Bag/Given   piperacillin-tazobactam (ZOSYN) IVPB 3.375 g 3.375 g 12.5 mL/hr   05/02/17 1421 New  Bag/Given   vancomycin (VANCOCIN) IVPB 1000 mg/200 mL premix 1,000 mg 200 mL/hr   05/02/17 2059 New Bag/Given   vancomycin (VANCOCIN) 1,250 mg in sodium chloride 0.9 % 250 mL IVPB 1,250 mg 166.7 mL/hr   05/02/17 2202 New Bag/Given   piperacillin-tazobactam (ZOSYN) IVPB 3.375 g 3.375 g 12.5 mL/hr   05/03/17 0615 New Bag/Given   piperacillin-tazobactam (ZOSYN) IVPB 3.375 g 3.375 g 12.5 mL/hr   05/03/17 1021 New Bag/Given   vancomycin (VANCOCIN) 1,250 mg in sodium chloride 0.9 % 250 mL IVPB 1,250 mg 166.7 mL/hr   05/03/17 1532 New Bag/Given   piperacillin-tazobactam (ZOSYN) IVPB 3.375 g 3.375 g 12.5 mL/hr   05/03/17 2051 New Bag/Given   vancomycin (VANCOCIN) 1,250 mg in sodium chloride 0.9 % 250 mL IVPB 1,250 mg 166.7 mL/hr   05/03/17 2236 New Bag/Given   piperacillin-tazobactam (ZOSYN) IVPB 3.375 g 3.375 g 12.5 mL/hr   05/04/17 0544 New Bag/Given   piperacillin-tazobactam (ZOSYN) IVPB 3.375 g 3.375 g 12.5 mL/hr   05/04/17 0901 New Bag/Given   vancomycin (VANCOCIN) 1,250 mg in sodium chloride 0.9 % 250 mL IVPB 1,250 mg 166.7 mL/hr   05/04/17 1131 Given   ceFAZolin (ANCEF) IVPB 2g/100 mL premix 2 g  05/04/17 1400 New Bag/Given   piperacillin-tazobactam (ZOSYN) IVPB 3.375 g 3.375 g 12.5 mL/hr      MEDICATIONS: . aspirin EC  81 mg Oral Daily  . cyanocobalamin  1,000 mcg Subcutaneous Q30 days  . dorzolamide-timolol  1 drop Both Eyes BID  . enoxaparin (LOVENOX) injection  40 mg Subcutaneous Q24H  . ferrous sulfate  325 mg Oral Daily  . gabapentin  100 mg Oral TID  . latanoprost  1 drop Both Eyes QHS  . pantoprazole  40 mg Oral Daily  . rOPINIRole  0.25 mg Oral TID  . sodium chloride flush  10-40 mL Intracatheter Q12H  . sodium chloride flush  3 mL Intravenous Q12H    Review of Systems - 11 systems reviewed and negative per HPI   OBJECTIVE: Temp:  [96.2 F (35.7 C)-97.9 F (36.6 C)] 97.5 F (36.4 C) (03/15 1436) Pulse Rate:  [39-78] 68 (03/15 1436) Resp:  [12-27] 15 (03/15  1410) BP: (83-158)/(42-92) 154/87 (03/15 1436) SpO2:  [95 %-100 %] 96 % (03/15 1436) Weight:  [96 kg (211 lb 10.3 oz)] 96 kg (211 lb 10.3 oz) (03/15 0513)  Physical Exam  Constitutional: He is oriented to person, place, and time. He appears well-developed and well-nourished. No distress.  HENT:  Mouth/Throat: Oropharynx is clear and moist. No oropharyngeal exudate.  Cardiovascular: Normal rate, regular rhythm and normal heart sounds. Exam reveals no gallop and no friction rub.  No murmur heard.  Pulmonary/Chest: Effort normal and breath sounds normal. No respiratory distress. He has no wheezes.  Abdominal: Soft. Bowel sounds are normal. He exhibits no distension. There is no tenderness.  Lymphadenopathy:  He has no cervical adenopathy.  Neurological: He is alert and oriented to person, place, and time.  Skin: L foot wrapped post op from today Psychiatric: He has a normal mood and affect. His behavior is normal.      LABS: Results for orders placed or performed during the hospital encounter of 05/02/17 (from the past 48 hour(s))  Basic metabolic panel     Status: Abnormal   Collection Time: 05/03/17  4:39 AM  Result Value Ref Range   Sodium 140 135 - 145 mmol/L   Potassium 4.1 3.5 - 5.1 mmol/L   Chloride 110 101 - 111 mmol/L   CO2 23 22 - 32 mmol/L   Glucose, Bld 103 (H) 65 - 99 mg/dL   BUN 20 6 - 20 mg/dL   Creatinine, Ser 1.02 0.61 - 1.24 mg/dL   Calcium 8.5 (L) 8.9 - 10.3 mg/dL   GFR calc non Af Amer >60 >60 mL/min   GFR calc Af Amer >60 >60 mL/min    Comment: (NOTE) The eGFR has been calculated using the CKD EPI equation. This calculation has not been validated in all clinical situations. eGFR's persistently <60 mL/min signify possible Chronic Kidney Disease.    Anion gap 7 5 - 15    Comment: Performed at [facility], [address] [place], [place] [address]  CBC     Status: Abnormal   Collection Time: 05/03/17  4:39 AM  Result Value Ref Range   WBC 4.2  3.8 - 10.6 K/uL   RBC 4.13 (L) 4.40 - 5.90 MIL/uL   Hemoglobin 12.5 (L) 13.0 - 18.0 g/dL   HCT 36.5 (L) 40.0 - 52.0 %   MCV 88.4 80.0 - 100.0 fL   MCH 30.2 26.0 - 34.0 pg   MCHC 34.2 32.0 - 36.0 g/dL   RDW 13.8 11.5 - 14.5 %  Platelets 138 (L) 150 - 440 K/uL    Comment: Performed at [facility], [address] [place], [place] [address]  Protime-INR     Status: None   Collection Time: 05/03/17  4:39 AM  Result Value Ref Range   Prothrombin Time 13.5 11.4 - 15.2 seconds   INR 1.04     Comment: Performed at [facility], [address] [place], [place] [address]  Aerobic/Anaerobic Culture (surgical/deep wound)     Status: None (Preliminary result)   Collection Time: 05/03/17  8:13 AM  Result Value Ref Range   Specimen Description      FOOT Performed at [facility], [address], [place], [place] [address]    Special Requests      Normal Performed at [facility], [place], [place] [address]    Gram Stain      RARE WBC PRESENT, PREDOMINANTLY PMN RARE GRAM POSITIVE COCCI    Culture      CULTURE REINCUBATED FOR BETTER GROWTH Performed at [facility], [place] [address] [place], [place] [address]    Report Status PENDING   Surgical PCR screen     Status: None   Collection Time: 05/03/17  5:11 PM  Result Value Ref Range   MRSA, PCR NEGATIVE NEGATIVE   Staphylococcus aureus NEGATIVE NEGATIVE    Comment: (NOTE) The Xpert SA Assay (FDA approved for NASAL specimens in patients 10 years of age and older), is one component of a comprehensive surveillance program. It is not intended to diagnose infection nor to guide or monitor treatment. Performed at [facility], [address] [place], [place] [address]   Vancomycin, trough     Status: None   Collection Time: 05/04/17  7:48 AM  Result Value Ref Range   Vancomycin Tr 19 15 - 20 ug/mL    Comment: Performed at [facility], [address] [place], [place] [address]   No components found for: ESR, C REACTIVE PROTEIN MICRO: Recent Results (from the past 720 hour(s))  Aerobic/Anaerobic Culture (surgical/deep wound)     Status: None (Preliminary result)   Collection Time: 05/03/17  8:13 AM  Result Value Ref Range Status   Specimen Description   Final    FOOT Performed at [facility], [address] [place], [place] [address]    Special Requests   Final    Normal Performed at [facility], [address] [place], [place] [address]    Gram Stain   Final    RARE WBC PRESENT, PREDOMINANTLY PMN RARE GRAM POSITIVE COCCI    Culture   Final    CULTURE REINCUBATED FOR BETTER GROWTH Performed at [facility], [place] [address], [place], [place] [address]    Report Status PENDING  Incomplete  Surgical PCR screen     Status: None   Collection Time: 05/03/17  5:11 PM  Result Value Ref Range Status   MRSA, PCR NEGATIVE NEGATIVE Final   Staphylococcus aureus NEGATIVE NEGATIVE Final    Comment: (NOTE) The Xpert SA Assay (FDA approved for NASAL specimens in patients 22 years of age and older), is one component of a comprehensive surveillance program. It is not intended to diagnose infection nor to guide or monitor treatment. Performed at [facility], [address] [place], [place] [address]     IMAGING: Mr Foot Left W Wo Contrast  Result Date: 04/25/2017 CLINICAL DATA:  Nonhealing wound of the  left foot.  Swelling. EXAM: MRI OF THE LEFT FOREFOOT WITHOUT AND WITH CONTRAST TECHNIQUE: Multiplanar, multisequence MR imaging of the left forefoot was performed both before and after administration of intravenous contrast. CONTRAST:  67m MULTIHANCE GADOBENATE DIMEGLUMINE 529 MG/ML IV SOLN COMPARISON:  Radiographs dated 12/19/2016 and MRI dated 11/13/2016 FINDINGS: Bones/Joint/Cartilage There is osteomyelitis of the proximal phalanx of the third toe and of the distal third metatarsal bone destruction.  An abscess surrounds the proximal phalanx of the third toe. There is edema and abnormal enhancement of the soft tissues around the third MTP joint. Abnormal edema and enhancement extends up the shaft of the third metatarsal to the base. The other visualized bones of the forefoot demonstrate no significant abnormalities. No appreciable soft tissue abscesses elsewhere in the foot. IMPRESSION: 1. Osteomyelitis of the third metatarsal and proximal phalanx of the third toe. 2. Prominent abscess surrounding the third MTP joint and proximal phalanx of the third toe. Electronically Signed   By: JLorriane ShireM.D.   On: 04/25/2017 12:59   [place] Site Rite  Result Date: 05/03/2017 If Site Rite image not attached, placement could not be confirmed due to current cardiac rhythm.  Lab Results  Component Value Date   ESRSEDRATE 49 (H) 05/02/2017   Lab Results  Component Value Date   CRP <0.8 05/02/2017    Assessment:   RTAYSOM GLYMPHis a 77y.o. male with L foot third toe osteomyelitis. Prior cx nml skin flora, staph aureus (no sensis I can find) and bacteroides.  This admission ESR 49 (was 90 on 2/25)  , CRP 0.8. MRSA PCR neg.  On 3/15  Partial third ray amputation left third toe and metatarsal Culture pending but GS with GPC  I spoke with Dr FVickki Muffand he reports that the site did not appear as purulent as expected and the abscess reported on MRI was not very pronounced. He is wondering if could be atypical infection and has sent afb and fungal cxs  Recommendations PICC placed Will likely need IV zosyn for 2- 4 weeks with follow up  Routine fungal and afb cxs pending Can dc over weekend if ok with IM and Podiatry I will follow cultures after dc. See abx order sheet.  Thank you very much for allowing me to participate in the care of this patient. Please call with questions.   DCheral Marker FOla Spurr MD

## 2017-05-04 NOTE — Op Note (Signed)
Operative note   Surgeon:Beatrice Sehgal Armed forces logistics/support/administrative officerowler    Assistant: None    Preop diagnosis: Osteomyelitis left third toe and joint    Postop diagnosis: Same    Procedure: Partial third ray amputation left third toe and metatarsal    EBL: Minimal    Anesthesia:local and general    Hemostasis: Ankle tourniquet inflated to 200 mmHg for approximately 16 minutes    Specimen: Bone and soft tissue for pathology, swab for aerobic and anaerobic, tissue for acid-fast bacillus and fungal cultures.    Complications: None    Operative indications:Jeremy Herrera is an 77 y.o. that presents today for surgical intervention.  The risks/benefits/alternatives/complications have been discussed and consent has been given.    Procedure:  Patient was brought into the OR and placed on the operating table in thesupine position. After anesthesia was obtained theleft lower extremity was prepped and draped in usual sterile fashion.  Attention was directed to the third toe where a longitudinal incision was made along the mid shaft of the third metatarsal circumferential around the base of the third toe.  Full-thickness incision was then taken down to the midshaft of the third metatarsal.  At this time osteotomy was created.  The partial third ray amputation was then removed and the toe was removed in toto.  There was noted to be some gray yellowish material surrounding the third toe and metatarsal.  No obvious purulence was noted.  At this time cultures were obtained.  Tissue was sent for pathology.  The wound was flushed with copious amounts of irrigation.  Tourniquet was dropped and all bleeders were Bovie cauterized.  Skin was closed with a 3-0 nylon.  Small area was packed open with iodoform packing.  Large bulky sterile dressing was applied.    Patient tolerated the procedure and anesthesia well.  Was transported from the OR to the PACU with all vital signs stable and vascular status intact. To be discharged per routine  protocol.  Will follow up in approximately 1 week in the outpatient clinic.

## 2017-05-04 NOTE — Anesthesia Postprocedure Evaluation (Signed)
Anesthesia Post Note  Patient: Elenor QuinonesRichard L Remus  Procedure(s) Performed: AMPUTATION TOE - third left toe (Left Toe)  Patient location during evaluation: Endoscopy Anesthesia Type: General Level of consciousness: awake and alert Pain management: pain level controlled Vital Signs Assessment: post-procedure vital signs reviewed and stable Respiratory status: spontaneous breathing, nonlabored ventilation, respiratory function stable and patient connected to nasal cannula oxygen Cardiovascular status: blood pressure returned to baseline and stable Postop Assessment: no apparent nausea or vomiting Anesthetic complications: no     Last Vitals:  Vitals:   05/04/17 1410 05/04/17 1436  BP: 119/80 (!) 154/87  Pulse: 67 68  Resp: 15   Temp: 36.5 C (!) 36.4 C  SpO2: 99% 96%    Last Pain:  Vitals:   05/04/17 1436  TempSrc: Oral  PainSc:                  Cleda MccreedyJoseph K Suhaylah Wampole

## 2017-05-04 NOTE — Progress Notes (Signed)
Infectious Disease Long Term IV Antibiotic Orders Elenor QuinonesRichard L Newmann 10/03/1940  Diagnosis: L 3rd toe osteomyelitis- s/p ray amputation  Concern for fungal infection   Culture results Pending LABS Lab Results  Component Value Date   CREATININE 1.02 05/03/2017   Lab Results  Component Value Date   WBC 4.2 05/03/2017   HGB 12.5 (L) 05/03/2017   HCT 36.5 (L) 05/03/2017   MCV 88.4 05/03/2017   PLT 138 (L) 05/03/2017   Lab Results  Component Value Date   ESRSEDRATE 49 (H) 05/02/2017   Lab Results  Component Value Date   CRP <0.8 05/02/2017    Allergies:  Allergies  Allergen Reactions  . Hydrocodone-Acetaminophen Nausea And Vomiting    Discharge antibiotics Zosyn  3.375  grams every 8 Hours if can do extended 4 hour infusion  Or every 6 hours if cannot do extended infusion  PICC Care per protocol Labs weekly while on IV antibiotics -FAX weekly labs to [phone] CBC w diff   Cr CRP   Planned duration of antibiotics 4 weeks  Stop date 4/13  Follow up clinic date 3 weeks   Mick Sellavid P Tailer Volkert, MD

## 2017-05-04 NOTE — Progress Notes (Signed)
Pt c/o severe pain on the left foot. Pt was given PRN Tramadol and Flexeril but with little to no relief. On call Dr. Caryn BeeMaier paged and ordered for one time dose of Dilaudid 0.5mg  IV. Will administer as ordered and continue to monitor.

## 2017-05-04 NOTE — Anesthesia Procedure Notes (Signed)
Procedure Name: LMA Insertion Date/Time: 05/04/2017 11:37 AM Performed by: Stormy Fabianurtis, Jenniferann Stuckert, CRNA Pre-anesthesia Checklist: Patient identified, Patient being monitored, Timeout performed, Emergency Drugs available and Suction available Patient Re-evaluated:Patient Re-evaluated prior to induction Oxygen Delivery Method: Circle system utilized Preoxygenation: Pre-oxygenation with 100% oxygen Induction Type: IV induction Ventilation: Mask ventilation without difficulty LMA: LMA inserted LMA Size: 5.0 Tube type: Oral Number of attempts: 1 Placement Confirmation: positive ETCO2 and breath sounds checked- equal and bilateral Tube secured with: Tape Dental Injury: Teeth and Oropharynx as per pre-operative assessment

## 2017-05-04 NOTE — Anesthesia Preprocedure Evaluation (Signed)
Anesthesia Evaluation  Patient identified by MRN, date of birth, ID band Patient awake    Reviewed: Allergy & Precautions, H&P , NPO status , Patient's Chart, lab work & pertinent test results  History of Anesthesia Complications Negative for: history of anesthetic complications  Airway Mallampati: III  TM Distance: >3 FB Neck ROM: limited    Dental  (+) Chipped, Poor Dentition, Missing   Pulmonary neg pulmonary ROS, neg shortness of breath,           Cardiovascular Exercise Tolerance: Good hypertension, (-) angina+CHF  (-) Past MI and (-) DOE      Neuro/Psych PSYCHIATRIC DISORDERS  Neuromuscular disease    GI/Hepatic Neg liver ROS, GERD  Medicated and Controlled,  Endo/Other  diabetes, Type 2  Renal/GU      Musculoskeletal   Abdominal   Peds  Hematology negative hematology ROS (+)   Anesthesia Other Findings Signs and symptoms suggestive of sleep apnea   Past Medical History: No date: Anemia No date: BPH (benign prostatic hyperplasia) No date: CHF (congestive heart failure) (HCC) No date: Dysphagia No date: GERD (gastroesophageal reflux disease) No date: HLD (hyperlipidemia) No date: HTN (hypertension) No date: Irregular heart beat No date: Nocturia No date: Urinary retention  Past Surgical History: 1964: bladder patching 2014: HIP SURGERY; Right 11/14/2016: IRRIGATION AND DEBRIDEMENT FOOT; Left     Comment:  Procedure: IRRIGATION AND DEBRIDEMENT FOOT;  Surgeon:               Gwyneth RevelsFowler, Justin, DPM;  Location: [facility] ORS;  Service:               Podiatry;  Laterality: Left; No date: TRANSURETHRAL RESECTION OF PROSTATE  BMI    Body Mass Index:  27.92 kg/m      Reproductive/Obstetrics negative OB ROS                             Anesthesia Physical Anesthesia Plan  ASA: III  Anesthesia Plan: General   Post-op Pain Management:    Induction: Intravenous  PONV Risk Score  and Plan: Ondansetron, Dexamethasone and Treatment may vary due to age or medical condition  Airway Management Planned: LMA  Additional Equipment:   Intra-op Plan:   Post-operative Plan: Extubation in OR  Informed Consent: I have reviewed the patients History and Physical, chart, labs and discussed the procedure including the risks, benefits and alternatives for the proposed anesthesia with the patient or authorized representative who has indicated his/her understanding and acceptance.   Dental Advisory Given  Plan Discussed with: Anesthesiologist, CRNA and Surgeon  Anesthesia Plan Comments: (Patient consented for risks of anesthesia including but not limited to:  - adverse reactions to medications - damage to teeth, lips or other oral mucosa - sore throat or hoarseness - Damage to heart, brain, lungs or loss of life  Patient voiced understanding.)        Anesthesia Quick Evaluation

## 2017-05-04 NOTE — Anesthesia Post-op Follow-up Note (Signed)
Anesthesia QCDR form completed.        

## 2017-05-04 NOTE — Transfer of Care (Signed)
Immediate Anesthesia Transfer of Care Note  Patient: Jeremy Herrera  Procedure(s) Performed: Procedure(s): AMPUTATION TOE - third left toe (Left)  Patient Location: PACU  Anesthesia Type:General  Level of Consciousness: sedated  Airway & Oxygen Therapy: Patient Spontanous Breathing and Patient connected to face mask oxygen  Post-op Assessment: Report given to RN and Post -op Vital signs reviewed and stable  Post vital signs: Reviewed and stable  Last Vitals:  Vitals:   05/04/17 1046 05/04/17 1240  BP: (!) 158/86 (!) 101/58  Pulse: 77 78  Resp: 16 13  Temp: (!) 36.1 C (!) 35.7 C  SpO2: 100% 100%    Complications: No apparent anesthesia complications

## 2017-05-05 ENCOUNTER — Inpatient Hospital Stay: Payer: Medicare PPO

## 2017-05-05 LAB — CREATININE, SERUM
Creatinine, Ser: 1.11 mg/dL (ref 0.61–1.24)
GFR calc Af Amer: >60 mL/min (ref >60)
GFR calc non Af Amer: >60 mL/min (ref >60)

## 2017-05-05 MED ORDER — PIPERACILLIN-TAZOBACTAM 3.375 G IVPB
3.3750 g | Freq: Four times a day (QID) | INTRAVENOUS | Status: DC
  Administered 2017-05-05 (×2): 3.375 g via INTRAVENOUS
  Filled 2017-05-05 (×2): qty 50

## 2017-05-05 MED ORDER — PIPERACILLIN-TAZOBACTAM 3.375 G IVPB: 3.3750 g | Freq: Four times a day (QID) | INTRAVENOUS | 0 refills | Status: AC

## 2017-05-05 MED ORDER — SODIUM CHLORIDE 0.9 % IV BOLUS (SEPSIS)
500.0000 mL | Freq: Once | INTRAVENOUS | Status: AC
  Administered 2017-05-05: 500 mL via INTRAVENOUS

## 2017-05-05 MED ORDER — PIPERACILLIN-TAZOBACTAM 3.375 G IVPB
3.3750 g | Freq: Three times a day (TID) | INTRAVENOUS | Status: DC
  Administered 2017-05-06 (×2): 3.375 g via INTRAVENOUS
  Filled 2017-05-05 (×2): qty 50

## 2017-05-05 MED ORDER — SODIUM CHLORIDE 0.9% FLUSH: 20.0000 mL | Freq: Four times a day (QID) | INTRAVENOUS | 0 refills | Status: DC

## 2017-05-05 MED ORDER — TRAMADOL HCL 50 MG PO TABS: 50.0000 mg | ORAL_TABLET | Freq: Four times a day (QID) | ORAL | 0 refills | Status: DC | PRN

## 2017-05-05 MED ORDER — SENNOSIDES-DOCUSATE SODIUM 8.6-50 MG PO TABS: 1.0000 | ORAL_TABLET | Freq: Every evening | ORAL | 0 refills | Status: DC | PRN

## 2017-05-05 MED ORDER — CYCLOBENZAPRINE HCL 10 MG PO TABS: 10.0000 mg | ORAL_TABLET | Freq: Three times a day (TID) | ORAL | 0 refills | Status: DC | PRN

## 2017-05-05 NOTE — Care Management (Signed)
CM informed that Jeremy Herrera- patient's daughter is stating that does not feel comfortable taking patient home today because he had not been out of the bed or ambulated since admission. Joanie and patient state that if it was recommended- would seriously consider skilled nursing. Was seen by physical therapy and recommendation regarding functional status was home with home health.  CM explained that there is a discharge order present indicating medical stability for discharge but could appeal if felt that patient not medically stable for discharge.  She discussed with her brother and decided patient would discharge to her address- [address] [place] [address] [place] [phone]).  During this time, patient was found to be hypotensive and symptomatic.  Discharge was cancelled.  Instructed Joanie to place the IV medication that had been delivered to patient's home be placed in the refrigeration asap.  Verbalized understanding

## 2017-05-05 NOTE — Progress Notes (Signed)
Pt's BP is elevated at 158/98. Dr. Renae GlossWieting notified.

## 2017-05-05 NOTE — Progress Notes (Signed)
Patient ID: Jeremy Herrera, male   DOB: 1940-03-15, 77 y.o.   MRN: [id number]  [employer] PROGRESS NOTE  Jeremy Herrera:[id number] DOB: 1940/04/29 DOA: 05/02/2017 PCP: Marina Goodell, MD  HPI/Subjective: Patient seen this morning with Dr. Ether Griffins as he was examining his foot.  Patient at that time felt okay and had some pain in his foot overnight.  Patient seen again after being able to set up home health.  Family at the time was there.  Objective: Vitals:   05/05/17 1458 05/05/17 1500  BP: (!) 72/52 (!) 93/56  Pulse:  67  Resp:  14  Temp:  98.1 F (36.7 C)  SpO2:  98%    Filed Weights   05/02/17 1220 05/04/17 0513 05/05/17 0442  Weight: 96.6 kg (212 lb 15.4 oz) 96 kg (211 lb 10.3 oz) 101.5 kg (223 lb 12.3 oz)    ROS: Review of Systems  Constitutional: Negative for chills and fever.  Eyes: Negative for blurred vision.  Respiratory: Negative for cough and shortness of breath.   Cardiovascular: Negative for chest pain.  Gastrointestinal: Negative for abdominal pain, constipation, diarrhea, nausea and vomiting.  Genitourinary: Negative for dysuria.  Musculoskeletal: Positive for joint pain.  Neurological: Negative for dizziness and headaches.   Exam: Physical Exam  Constitutional: He is oriented to person, place, and time.  HENT:  Nose: No mucosal edema.  Mouth/Throat: No oropharyngeal exudate or posterior oropharyngeal edema.  Eyes: Conjunctivae, EOM and lids are normal. Pupils are equal, round, and reactive to light.  Neck: No JVD present. Carotid bruit is not present. No edema present. No thyroid mass and no thyromegaly present.  Cardiovascular: S1 normal and S2 normal. Exam reveals no gallop.  No murmur heard. Pulses:      Dorsalis pedis pulses are 2+ on the right side, and 2+ on the left side.  Respiratory: No respiratory distress. He has no wheezes. He has no rhonchi. He has no rales.  GI: Soft. Bowel sounds are normal. There is no tenderness.   Musculoskeletal:       Right ankle: He exhibits no swelling.       Left ankle: He exhibits no swelling.  Lymphadenopathy:    He has no cervical adenopathy.  Neurological: He is alert and oriented to person, place, and time. No cranial nerve deficit.  Skin: Skin is warm. Nails show no clubbing.  Some bleeding at surgical site.  Amputation site sewn together.  No signs of infection seen on the skin.  Psychiatric: He has a normal mood and affect.      Data Reviewed: Basic Metabolic Panel: Recent Labs  Lab 05/02/17 1253 05/03/17 0439 05/05/17 0339  NA 140 140  --   K 4.9 4.1  --   CL 110 110  --   CO2 21* 23  --   GLUCOSE 151* 103*  --   BUN 19 20  --   CREATININE 1.09 1.02 1.11  CALCIUM 8.6* 8.5*  --    Liver Function Tests: Recent Labs  Lab 05/02/17 1253  AST 18  ALT 11*  ALKPHOS 122  BILITOT 0.6  PROT 6.7  ALBUMIN 3.5   CBC: Recent Labs  Lab 05/02/17 1253 05/03/17 0439  WBC 5.6 4.2  NEUTROABS 3.7  --   HGB 12.4* 12.5*  HCT 36.8* 36.5*  MCV 90.3 88.4  PLT 148* 138*     Recent Results (from the past 240 hour(s))  Aerobic/Anaerobic Culture (surgical/deep wound)     Status: None (  Preliminary result)   Collection Time: 05/03/17  8:13 AM  Result Value Ref Range Status   Specimen Description   Final    FOOT Performed at [facility], [address] [place], [place] [address]    Special Requests   Final    Normal Performed at [facility], [address] [place], [place] [address]    Gram Stain   Final    RARE WBC PRESENT, PREDOMINANTLY PMN RARE GRAM POSITIVE COCCI Performed at [facility], [address] [place], [place] [address]    Culture   Final    CULTURE REINCUBATED FOR BETTER GROWTH NO ANAEROBES ISOLATED; CULTURE IN PROGRESS FOR 5 DAYS    Report Status PENDING  Incomplete  Surgical PCR screen     Status: None   Collection Time: 05/03/17  5:11 PM  Result Value Ref Range Status   MRSA, PCR NEGATIVE NEGATIVE  Final   Staphylococcus aureus NEGATIVE NEGATIVE Final    Comment: (NOTE) The Xpert SA Assay (FDA approved for NASAL specimens in patients 78 years of age and older), is one component of a comprehensive surveillance program. It is not intended to diagnose infection nor to guide or monitor treatment. Performed at [facility], [address] [place], [place] [address]   Aerobic/Anaerobic Culture (surgical/deep wound)     Status: None (Preliminary result)   Collection Time: 05/04/17 12:12 PM  Result Value Ref Range Status   Specimen Description   Final    TISSUE TOE LEFT THIRD TOE Performed at [facility], [address] [place], [place] [address]    Special Requests   Final    NONE Performed at [facility], [address] [place], [place] [address]    Gram Stain   Final    MODERATE WBC PRESENT,BOTH PMN AND MONONUCLEAR FEW GRAM POSITIVE COCCI    Culture   Final    CULTURE REINCUBATED FOR BETTER GROWTH Performed at [facility], [address] [place], [place] [address]    Report Status PENDING  Incomplete      Scheduled Meds: . aspirin EC  81 mg Oral Daily  . cyanocobalamin  1,000 mcg Subcutaneous Q30 days  . dorzolamide-timolol  1 drop Both Eyes BID  . enoxaparin (LOVENOX) injection  40 mg Subcutaneous Q24H  . ferrous sulfate  325 mg Oral Daily  . gabapentin  100 mg Oral TID  . latanoprost  1 drop Both Eyes QHS  . pantoprazole  40 mg Oral Daily  . sodium chloride flush  10-40 mL Intracatheter Q12H  . sodium chloride flush  3 mL Intravenous Q12H   Continuous Infusions: . sodium chloride    . piperacillin-tazobactam (ZOSYN)  IV Stopped (05/05/17 1221)  . sodium chloride      Assessment/Plan:  1. Hypotension this afternoon.  Fluid bolus.  Cancel discharge. 2. 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  Left foot pain.  Patient already has a PICC line in and was set up to be discharged with home health today.   Infectious disease Dr. Sampson GoonFitzgerald and podiatry Dr. Ether GriffinsFowler will follow as outpatient.  Dressing changes as per home health every other day.  Tramadol prescribed as outpatient.  2-4 weeks of IV Zosyn as per ID. 3. GERD on Protonix 4. Glaucoma unspecified on numerous eyedrops 5. Neuropathy on gabapentin  Code Status:     Code Status Orders  (From admission, onward)        Start  Ordered   05/02/17 1245  Full code  Continuous     05/02/17 1244    Code Status History    Date Active Date Inactive Code Status Order ID Comments User Context   12/18/2016 17:05 12/19/2016 18:46 Full Code [id number]  Adrian Saran, MD Inpatient   11/13/2016 13:37 11/17/2016 19:19 Full Code [id number]  Ramonita Lab, MD Inpatient     Family Communication: Spoke with daughter on the phone, family at the bedside Disposition Plan: Everything was set up to be discharged with home with home health today but this afternoon developed hypotension.  Consultants:  Infectious disease  Podiatry  Procedures:  Toe amputation  Antibiotics:  IV Zosyn  Time spent: 45 minutes, and coordination of care speaking with podiatry, care manager and nursing.  Had to speak with care manager and nursing on 4 different occasions today for both of them.  Jeremy Herrera

## 2017-05-05 NOTE — Progress Notes (Signed)
Daily Progress Note   Subjective  - 1 Day Post-Op  F/u 3rd toe amputation and metatarsal.  Some pain overnight.  Pain 1/10 this am.  Objective Vitals:   05/04/17 1609 05/04/17 1954 05/05/17 0416 05/05/17 0442  BP: (!) 154/87 (!) 148/72 (!) 151/85   Pulse: 78 77 63   Resp: 17 16 16    Temp:  (!) 97.5 F (36.4 C) (!) 97.1 F (36.2 C)   TempSrc:  Axillary    SpO2: 99% 99% 98%   Weight:    101.5 kg (223 lb 12.3 oz)  Height:        Physical Exam: Wound stable.  Some bloody drainage.  No pus. Cx with GPC's. Intra-op cx's performed.   Laboratory CBC    Component Value Date/Time   WBC 4.2 05/03/2017 0439   HGB 12.5 (L) 05/03/2017 0439   HGB 9.0 (L) 04/01/2013 0728   HCT 36.5 (L) 05/03/2017 0439   HCT 25.8 (L) 04/01/2013 0728   PLT 138 (L) 05/03/2017 0439   PLT 188 04/01/2013 0728    BMET    Component Value Date/Time   NA 140 05/03/2017 0439   NA 139 04/01/2013 0728   K 4.1 05/03/2017 0439   K 4.1 04/01/2013 0728   CL 110 05/03/2017 0439   CL 109 (H) 04/01/2013 0728   CO2 23 05/03/2017 0439   CO2 27 04/01/2013 0728   GLUCOSE 103 (H) 05/03/2017 0439   GLUCOSE 98 04/01/2013 0728   BUN 20 05/03/2017 0439   BUN 13 04/01/2013 0728   CREATININE 1.11 05/05/2017 0339   CREATININE 1.07 04/01/2013 0728   CALCIUM 8.5 (L) 05/03/2017 0439   CALCIUM 8.4 (L) 04/01/2013 0728   GFRNONAA >60 05/05/2017 0339   GFRNONAA >60 04/01/2013 0728   GFRAA >60 05/05/2017 0339   GFRAA >60 04/01/2013 [id number]    Assessment/Planning: Osteomyelitis left foot   Dressing changed.  OK for d/c from podiatry standpoint.  Dressing changes 3 x's per week.  Flush wound with saline and cover with bulky sterile dressing.  OK for Weight Bearing on heel in post op shoe with assistance.  IV abx per ID.  F/u with me in out pt clinic [place]    Jeremy Herrera, Jeremy Herrera  05/05/2017, 9:04 AM

## 2017-05-05 NOTE — Care Management (Signed)
Patient for discharge home today.  It has been determined that patient will require Zosyn q 6h as can not administer q 8 hours as an extended infusion.  [facility] will provide a PICC line extender as it is reported that patient will be administering some of his doses.  Spoke with patient, his brother and sister in law and son who are at bedside. They are not going to be assisting with administration of IV medication. His daughter Dorina HoyerJoanie will assist with administration but informed she will not be able to administer all the doses.   Patient will receive dose of Zosyn at noon then discharge home. [facility] will be present to assist with the 6pm dose today. Patient and his family members all agree that there will be no problem getting the patient into his house.  Obtained order for RN PT OT and Aide.

## 2017-05-05 NOTE — Progress Notes (Addendum)
Pt's BP is 93/56 sitting up in the chair. He is drowsy and lightheaded. Appears pale and falls asleep during conversation. He becomes alert with verbal stimuli and is oriented x 4. No assymetry to face noted. Strength to extremities 4/5 bilaterally. Dr. Renae GlossWieting notified. Discharge for today canceled and order to administer 1/2 L of NS over 4 hours verbally received.   Update 1707: Pt's BP has improved, and he states that he is feeling better but he remains drowsy. He is oriented x 4. He tell me that he "sometimes gets like that" in reference to his drowsiness and lightheadedness that occurred earlier. Because his mentation is altered, Dr. Renae GlossWieting notified and he stated to order a stat CT of the head w/o contrast. Orders placed. Chare nurse Marchelle Folks(amanda), ICU Charge nurse (Florentina AddisonKatie), and NeurosurgeonAdministrative coordinator Elnita Maxwell(Cheryl) notified.  Update 1750: Dr. Renae GlossWieting notified that CT scan has been completed and he stated to have teleneuro consult completed.   Update 1808: Dr. Renae GlossWieting called back to notify me that CT scan was WNL and that Dr. Judithann SheenSparks would be up to assess pt. Teleneuro has been initiated awaiting their call back.  1824: Teleneuro repaged.   1834: Dr. Judithann SheenSparks called and stated teleneuro did not need to be completed, but to have routine neurology consult placed so that pt may be seen tomorrow in the morning. Consult ordered.

## 2017-05-05 NOTE — Evaluation (Signed)
Physical Therapy Evaluation Patient Details Name: Jeremy Herrera MRN: [id number] DOB: May 22, 1940 Today's Date: 05/05/2017   History of Present Illness  Jeremy Herrera is a 77yo white male who presents on POD1 s/p third toe amputation. PTA pt was a full yindependent community dwelling adult, rollator to access community and home, [facility] in rooms where [phone] does not fit,. He is 38M s/p I&D of third ray, adn has been AMB with orthowedge shoe since that time.   Clinical Impression  Pt admitted with above diagnosis. Pt currently with functional limitations due to the deficits listed below (see "PT Problem List"). Upon entry, the patient is received semirecumbent in bed, no family/caregiver present: daughter arrives mid session.  The pt is awake and agreeable to participate. No acute distress noted at this time.   The pt is alert and oriented x3, pleasant, conversational, and following simple and multi-step commands consistently.  Functional mobility assessment demonstrates mild-moderate strength impairment globally, he endorses as his baseline, with more current limitations from pain.Empirically, the patient demonstrates increased risk of recurrent falls AEB gait speed <0.54ms, forward reach <5", and history of multiple insidious LOB PTA. Pt is medically ready for DC and home health services are being set up. Patient is at near baseline, all education completed, and time is given to address all questions/concerns. Additional skilled PT services can be met at the next venue of care, PT signing off. PT recommends daily ambulation ad lib or with nursing staff as needed to prevent deconditioning.     Follow Up Recommendations Home health PT;Supervision for mobility/OOB    Equipment Recommendations  None recommended by PT    Recommendations for Other Services       Precautions / Restrictions Precautions Precautions: Fall Restrictions Weight Bearing Restrictions: Yes LLE Weight Bearing: Weight  bearing as tolerated(with ortho wedge shoe )      Mobility  Bed Mobility Overal bed mobility: Modified Independent             General bed mobility comments: additional time needed  Transfers Overall transfer level: Needs assistance Equipment used: 4-wheeled walker Transfers: Sit to/from Stand           General transfer comment: safe, fluent, moderate effort required.   Ambulation/Gait Ambulation/Gait assistance: Supervision;Min guard Ambulation Distance (Feet): 100 Feet Assistive device: 4-wheeled walker Gait Pattern/deviations: Antalgic;Decreased step length - right;Decreased step length - left Gait velocity: 0.022m  Gait velocity interpretation: <1.8 ft/sec, indicative of risk for recurrent falls General Gait Details: ortho wedge shoe, attempting heel weightbearing, but quite difficult to maintain.   Stairs            Wheelchair Mobility    Modified Rankin (Stroke Patients Only)       Balance Overall balance assessment: Mild deficits observed, not formally tested;Modified Independent                                           Pertinent Vitals/Pain Pain Assessment: 0-10 Pain Score: 1  Pain Location: surgical site (left third toe stump)  Pain Descriptors / Indicators: Aching Pain Intervention(s): Limited activity within patient's tolerance;Monitored during session;Premedicated before session;Repositioned    Home Living Family/patient expects to be discharged to:: Private residence Living Arrangements: Alone Available Help at Discharge: Family;Available PRN/intermittently Type of Home: House Home Access: Ramped entrance     Home Layout: One level Home Equipment: [place] 4 wheels;Cane -  quad;Cane - single point;Bedside commode;Transport chair;Walker - 2 wheels Additional Comments: Daughter at work during the day, pt spends much of the day alone at home.  Pt has been set up with HHPT.     Prior Function Level of Independence:  Needs assistance   Gait / Transfers Assistance Needed: Has been ambulating household distances with RW. 1 fall in 3 months, but reports chronic intermittent insidious BLE buckling.   ADL's / Homemaking Assistance Needed: Pt dresses independently and performs sponge baths independently.  Daughter does the cooking, cleaning, driving.         Hand Dominance   Dominant Hand: Right    Extremity/Trunk Assessment   Upper Extremity Assessment Upper Extremity Assessment: Generalized weakness    Lower Extremity Assessment Lower Extremity Assessment: Generalized weakness    Cervical / Trunk Assessment Cervical / Trunk Assessment: Kyphotic  Communication   Communication: No difficulties  Cognition Arousal/Alertness: Awake/alert Behavior During Therapy: WFL for tasks assessed/performed Overall Cognitive Status: Within Functional Limits for tasks assessed                                        General Comments      Exercises     Assessment/Plan    PT Assessment All further PT needs can be met in the next venue of care  PT Problem List Decreased activity tolerance;Decreased strength;Decreased mobility;Pain       PT Treatment Interventions      PT Goals (Current goals can be found in the Care Plan section)  Acute Rehab PT Goals Patient Stated Goal: Regain strength and independencein AMB  PT Goal Formulation: With patient Time For Goal Achievement: 05/19/17 Potential to Achieve Goals: Good    Frequency     Barriers to discharge        Co-evaluation               AM-PAC PT "6 Clicks" Daily Activity  Outcome Measure Difficulty turning over in bed (including adjusting bedclothes, sheets and blankets)?: A Little Difficulty moving from lying on back to sitting on the side of the bed? : A Little Difficulty sitting down on and standing up from a chair with arms (e.g., wheelchair, bedside commode, etc,.)?: A Little Help needed moving to and from a bed  to chair (including a wheelchair)?: A Little Help needed walking in hospital room?: A Little Help needed climbing 3-5 steps with a railing? : A Little 6 Click Score: 18    End of Session Equipment Utilized During Treatment: Gait belt Activity Tolerance: Patient tolerated treatment well;Patient limited by pain Patient left: in chair;with family/visitor present;with call bell/phone within reach Nurse Communication: Mobility status PT Visit Diagnosis: Difficulty in walking, not elsewhere classified (R26.2);Repeated falls (R29.6);Pain Pain - Right/Left: Right Pain - part of body: Ankle and joints of foot    Time: 5009-3818 PT Time Calculation (min) (ACUTE ONLY): 24 min   Charges:   PT Evaluation $PT Eval Moderate Complexity: 1 Mod PT Treatments $Therapeutic Activity: 8-22 mins   PT G Codes:        2:23 PM, 05-12-2017 Etta Grandchild, PT, DPT Physical Therapist - [facility]  [phone] ([place])    Shakedra Beam C 05/12/17, 2:20 PM

## 2017-05-05 NOTE — Care Management Important Message (Signed)
Important Message  Patient Details  Name: Jeremy QuinonesRichard L Ballowe MRN: [id number] Date of Birth: 07/12/1940   Medicare Important Message Given:  Yes  Signed IM notice given   Eber HongGreene, Lunden Stieber R, RN 05/05/2017, 11:49 AM

## 2017-05-06 LAB — CBC
HCT: 36.6 % — ABNORMAL LOW (ref 40.0–52.0)
HEMOGLOBIN: 12.5 g/dL — AB (ref 13.0–18.0)
MCH: 30.3 pg (ref 26.0–34.0)
MCHC: 34.2 g/dL (ref 32.0–36.0)
MCV: 88.8 fL (ref 80.0–100.0)
Platelets: 125 10*3/uL — ABNORMAL LOW (ref 150–440)
RBC: 4.12 MIL/uL — ABNORMAL LOW (ref 4.40–5.90)
RDW: 13.8 % (ref 11.5–14.5)
WBC: 5.4 10*3/uL (ref 3.8–10.6)

## 2017-05-06 LAB — BASIC METABOLIC PANEL
Anion gap: 4 — ABNORMAL LOW (ref 5–15)
BUN: 17 mg/dL (ref 6–20)
CHLORIDE: 110 mmol/L (ref 101–111)
CO2: 26 mmol/L (ref 22–32)
CREATININE: 0.91 mg/dL (ref 0.61–1.24)
Calcium: 8.5 mg/dL — ABNORMAL LOW (ref 8.9–10.3)
GFR calc Af Amer: >60 mL/min (ref >60)
GFR calc non Af Amer: >60 mL/min (ref >60)
GLUCOSE: 97 mg/dL (ref 65–99)
POTASSIUM: 3.8 mmol/L (ref 3.5–5.1)
Sodium: 140 mmol/L (ref 135–145)

## 2017-05-06 MED ORDER — PIPERACILLIN-TAZOBACTAM 3.375 G IVPB 30 MIN
3.3750 g | Freq: Once | INTRAVENOUS | Status: AC
  Administered 2017-05-06: 3.375 g via INTRAVENOUS
  Filled 2017-05-06 (×2): qty 50

## 2017-05-06 NOTE — Discharge Summary (Signed)
[facility] [place] NAME: Jeremy Herrera    MR#:  [id number]  DATE OF BIRTH:  August 22, 1940  DATE OF ADMISSION:  05/02/2017 ADMITTING PHYSICIAN: Hillary Bow, MD  DATE OF DISCHARGE: 05/06/2017  1:17 PM  PRIMARY CARE PHYSICIAN: Sofie Hartigan, MD    ADMISSION DIAGNOSIS:  Osteomyelitis  DISCHARGE DIAGNOSIS:  Active Problems:   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[place])   SECONDARY DIAGNOSIS:   Past Medical History:  Diagnosis Date  . Anemia   . BPH (benign prostatic hyperplasia)   . CHF (congestive heart failure) ([place])   . Dysphagia   . GERD (gastroesophageal reflux disease)   . HLD (hyperlipidemia)   . HTN (hypertension)   . Irregular heart beat   . Nocturia   . Urinary retention     HOSPITAL COURSE:   1.  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  Left foot pain.  Patient already has a PICC line in.  Dr. Ola Spurr recommends 2-4 weeks of IV Zosyn.  Follow-up with Dr. Vickki Muff podiatry in 2 weeks.  Follow-up with Dr. Ola Spurr in 2 weeks.  Laboratory data ordered by Dr. Ola Spurr as outpatient.  I did prescribe tramadol as needed for pain but I advised him to switch over to Tylenol as quickly as possible because he did have some altered mental status with the tramadol. 2.  Hypotension and altered mental status yesterday afternoon.  CT scan of the head was negative.  The patient received a fluid bolus.  I cancel discharge yesterday.  Patient's mental status is back to normal. 3.  GERD on Protonix 4.  Glaucoma on numerous eyedrops 5.  Neuropathy on gabapentin 6.  History of congestive heart failure.  Unable to elaborate on this secondary to no echocardiogram record in our chart.  Home health set up.  PICC line care, dressing changes with dry bulky dressing every other day and help with IV infusion of Zosyn.   DISCHARGE CONDITIONS:   Satisfactory  CONSULTS OBTAINED:  Treatment Team:  Samara Deist, DPM Leonel Ramsay,  MD Alexis Goodell, MD  DRUG ALLERGIES:   Allergies  Allergen Reactions  . Hydrocodone-Acetaminophen Nausea And Vomiting    DISCHARGE MEDICATIONS:   Allergies as of 05/06/2017      Reactions   Hydrocodone-acetaminophen Nausea And Vomiting      Medication List    TAKE these medications   acetaminophen 500 MG tablet Commonly known as:  TYLENOL Take by mouth.   aspirin 81 MG tablet Take 81 mg by mouth daily.   B-12 1000 MCG/ML Kit Inject 1 mL as directed every 30 (thirty) days.   bimatoprost 0.01 % Soln Commonly known as:  LUMIGAN Place 1 drop into both eyes 2 (two) times daily.   cyclobenzaprine 10 MG tablet Commonly known as:  FLEXERIL Take 1 tablet (10 mg total) by mouth 3 (three) times daily as needed for muscle spasms. Notes to patient:  Make sure to separate doses by 8 hours   dorzolamide-timolol 22.3-6.8 MG/ML ophthalmic solution Commonly known as:  COSOPT Place 1 drop into both eyes 2 (two) times daily.   esomeprazole 20 MG capsule Commonly known as:  NEXIUM Take 20 mg by mouth daily at 12 noon.   ferrous sulfate 325 (65 FE) MG tablet Take 325 mg by mouth daily.   furosemide 20 MG tablet Commonly known as:  LASIX Take 20 mg by mouth daily as needed for fluid. Try to limit to no more than 3-4 times per week.  Notes to patient:  Try to limit administration of medicine to 3-4 times per week. If you feel you need it more often, call your doctor   gabapentin 100 MG capsule Commonly known as:  NEURONTIN Take 1 capsule (100 mg total) by mouth 3 (three) times daily.   piperacillin-tazobactam 3.375 GM/50ML IVPB Commonly known as:  ZOSYN Inject 50 mLs (3.375 g total) into the vein every 6 (six) hours for 27 days.   senna-docusate 8.6-50 MG tablet Commonly known as:  Senokot-S Take 1 tablet by mouth at bedtime as needed for mild constipation.   sodium chloride flush 0.9 % Soln Commonly known as:  NS 20 mLs by Intracatheter route every 6 (six) hours.    traMADol 50 MG tablet Commonly known as:  ULTRAM Take 1 tablet (50 mg total) by mouth every 6 (six) hours as needed for moderate pain.            Durable Medical Equipment  (From admission, onward)        Start     Ordered   05/05/17 1042  For home use only DME Other see comment  Once    Comments:  PICC Line Extender   05/05/17 1041       DISCHARGE INSTRUCTIONS:   Follow-up Dr. Vickki Muff and Dr. Ola Spurr in 2 weeks   If you experience worsening of your admission symptoms, develop shortness of breath, life threatening emergency, suicidal or homicidal thoughts you must seek medical attention immediately by calling 911 or calling your MD immediately  if symptoms less severe.  You Must read complete instructions/literature along with all the possible adverse reactions/side effects for all the Medicines you take and that have been prescribed to you. Take any new Medicines after you have completely understood and accept all the possible adverse reactions/side effects.   Please note  You were cared for by a hospitalist during your hospital stay. If you have any questions about your discharge medications or the care you received while you were in the hospital after you are discharged, you can call the unit and asked to speak with the hospitalist on call if the hospitalist that took care of you is not available. Once you are discharged, your primary care physician will handle any further medical issues. Please note that NO REFILLS for any discharge medications will be authorized once you are discharged, as it is imperative that you return to your primary care physician (or establish a relationship with a primary care physician if you do not have one) for your aftercare needs so that they can reassess your need for medications and monitor your lab values.    Today   CHIEF COMPLAINT:  No chief complaint on file.   HISTORY OF PRESENT ILLNESS:  Jeremy Herrera  is a 77 y.o. male came  in with infection of the toe   VITAL SIGNS:  Blood pressure 104/61, pulse (!) 42, temperature 97.6 F (36.4 C), temperature source Oral, resp. rate 20, height _0  (1.854 m), weight 100.3 kg (221 lb 1.9 oz), SpO2 98 %.    PHYSICAL EXAMINATION:  GENERAL:  77 y.o.-year-old patient lying in the bed with no acute distress.  EYES: Pupils equal, round, reactive to light and accommodation. No scleral icterus. Extraocular muscles intact.  HEENT: Head atraumatic, normocephalic. Oropharynx and nasopharynx clear.  NECK:  Supple, no jugular venous distention. No thyroid enlargement, no tenderness.  LUNGS: Normal breath sounds bilaterally, no wheezing, rales,rhonchi or crepitation. No use of accessory muscles of respiration.  CARDIOVASCULAR: S1, S2 normal. No murmurs, rubs, or gallops.  ABDOMEN: Soft, non-tender, non-distended. Bowel sounds present. No organomegaly or mass.  EXTREMITIES: No pedal edema, cyanosis, or clubbing.  NEUROLOGIC: Cranial nerves II through XII are intact. Muscle strength 5/5 in all extremities. Sensation intact. Gait not checked.  PSYCHIATRIC: The patient is alert and oriented x 3.  SKIN: Left foot wrapped in large dressing  DATA REVIEW:   CBC Recent Labs  Lab 05/06/17 0510  WBC 5.4  HGB 12.5*  HCT 36.6*  PLT 125*    Chemistries  Recent Labs  Lab 05/02/17 1253  05/06/17 0510  NA 140   < > 140  K 4.9   < > 3.8  CL 110   < > 110  CO2 21*   < > 26  GLUCOSE 151*   < > 97  BUN 19   < > 17  CREATININE 1.09   < > 0.91  CALCIUM 8.6*   < > 8.5*  AST 18  --   --   ALT 11*  --   --   ALKPHOS 122  --   --   BILITOT 0.6  --   --    < > = values in this interval not displayed.    Microbiology Results  Results for orders placed or performed during the hospital encounter of 05/02/17  Aerobic/Anaerobic Culture (surgical/deep wound)     Status: None (Preliminary result)   Collection Time: 05/03/17  8:13 AM  Result Value Ref Range Status   Specimen Description    Final    FOOT Performed at [facility], [address] [place], [place] [address]    Special Requests   Final    Normal Performed at [facility], [address] [place], [place] [address]    Gram Stain   Final    RARE WBC PRESENT, PREDOMINANTLY PMN RARE GRAM POSITIVE COCCI Performed at [facility], [place] [address], [place], [place] [address]    Culture   Final    NORMAL SKIN FLORA NO ANAEROBES ISOLATED; CULTURE IN PROGRESS FOR 5 DAYS    Report Status PENDING  Incomplete  Surgical PCR screen     Status: None   Collection Time: 05/03/17  5:11 PM  Result Value Ref Range Status   MRSA, PCR NEGATIVE NEGATIVE Final   Staphylococcus aureus NEGATIVE NEGATIVE Final    Comment: (NOTE) The Xpert SA Assay (FDA approved for NASAL specimens in patients 58 years of age and older), is one component of a comprehensive surveillance program. It is not intended to diagnose infection nor to guide or monitor treatment. Performed at [facility], [address] [place], [place] [address]   Aerobic/Anaerobic Culture (surgical/deep wound)     Status: None (Preliminary result)   Collection Time: 05/04/17 12:12 PM  Result Value Ref Range Status   Specimen Description   Final    TISSUE TOE LEFT THIRD TOE Performed at [facility], [address], [place], [place] [address]    Special Requests   Final    NONE Performed at [facility], [address] [place], [place] [address]    Gram Stain   Final    MODERATE WBC PRESENT,BOTH PMN AND MONONUCLEAR FEW GRAM POSITIVE COCCI Performed at [facility], [place] [address] [place], [place] [address]    Culture   Final    CULTURE REINCUBATED FOR BETTER GROWTH NO ANAEROBES ISOLATED; CULTURE IN PROGRESS FOR 5  DAYS    Report Status PENDING  Incomplete    RADIOLOGY:  Ct Head Code Stroke Wo Contrast  Result Date: 05/05/2017 CLINICAL DATA:  Code stroke.  77 y/o  M; mental status change.  EXAM: CT HEAD WITHOUT CONTRAST TECHNIQUE: Contiguous axial images were obtained from the base of the skull through the vertex without intravenous contrast. COMPARISON:  12/18/2016 CT head. FINDINGS: Brain: No evidence of acute infarction, hemorrhage, hydrocephalus, extra-axial collection or mass lesion/mass effect. Few stable nonspecific foci of hypoattenuation in subcortical and periventricular white matter compatible with mild chronic microvascular ischemic changes and there is mild brain parenchymal volume loss. Vascular: Calcific atherosclerosis of carotid siphons and vertebral arteries. No hyperdense vessel identified. Skull: Normal. Negative for fracture or focal lesion. Sinuses/Orbits: No acute finding. Other: None. ASPECTS [facility] Stroke Program Early CT Score) - Ganglionic level infarction (caudate, lentiform nuclei, internal capsule, insula, M1-M3 cortex): 7 - Supraganglionic infarction (M4-M6 cortex): 3 Total score (0-10 with 10 being normal): 10 IMPRESSION: 1. No acute intracranial abnormality identified. 2. Stable mild chronic microvascular ischemic changes and parenchymal volume loss of the brain. 3. ASPECTS is 10 These results were called by telephone at the time of interpretation on 05/05/2017 at 5:54 pm to Dr. Loletha Grayer , who verbally acknowledged these results. Electronically Signed   By: Kristine Garbe M.D.   On: 05/05/2017 17:57     Management plans discussed with the patient, family and they are in agreement.  CODE STATUS:     Code Status Orders  (From admission, onward)        Start     Ordered   05/02/17 1245  Full code  Continuous     05/02/17 1244    Code Status History    Date Active Date Inactive Code Status Order ID Comments User Context   12/18/2016 17:05 12/19/2016 18:46 Full Code [id number]  Bettey Costa, MD Inpatient   11/13/2016 13:37 11/17/2016 19:19 Full Code [id number]  [name], MD Inpatient      TOTAL TIME TAKING CARE OF THIS PATIENT:  58mnutes.    RLoletha GrayerM.D on 05/06/2017 at 2:28 PM  Between 7am to 6pm - Pager - [phone] After 6pm go to [email or web address] - password [email or web address] [facility] [facility]  [phone] CC: Primary care physician; FSofie Hartigan MD

## 2017-05-06 NOTE — Care Management (Signed)
Proceed with discharge today.  [facility] to have staff in the home for 6pm dose.  [facility] is aware that patient is at his daughter's address and discharging today. Daughter Dorina HoyerJoanie has no issues with the administration of IV medication "as long as I am taught how to do it."

## 2017-05-06 NOTE — Progress Notes (Signed)
Patient discharging home with home health. PICC in place on right upper arm, clean dry and intact. Patient discharging home on IV abx. Family at bedside, instructions and prescriptions given to patient, verbalized understanding. Family to transport patient home.

## 2017-05-07 LAB — FUNGAL STAIN REFLEX

## 2017-05-07 LAB — ACID FAST SMEAR (AFB, MYCOBACTERIA): Acid Fast Smear: NEGATIVE

## 2017-05-07 LAB — FUNGUS STAIN

## 2017-05-08 LAB — AEROBIC/ANAEROBIC CULTURE W GRAM STAIN (SURGICAL/DEEP WOUND)
Culture: NORMAL
Special Requests: NORMAL

## 2017-05-09 LAB — AEROBIC/ANAEROBIC CULTURE W GRAM STAIN (SURGICAL/DEEP WOUND)

## 2017-05-09 LAB — SURGICAL PATHOLOGY

## 2017-05-16 ENCOUNTER — Inpatient Hospital Stay: Payer: Medicare PPO

## 2017-05-16 ENCOUNTER — Inpatient Hospital Stay: Payer: Medicare PPO | Attending: Hematology and Oncology

## 2017-05-16 VITALS — Resp 20

## 2017-05-16 DIAGNOSIS — Z862 Personal history of diseases of the blood and blood-forming organs and certain disorders involving the immune mechanism: Secondary | ICD-10-CM | POA: Diagnosis not present

## 2017-05-16 DIAGNOSIS — E538 Deficiency of other specified B group vitamins: Secondary | ICD-10-CM

## 2017-05-16 DIAGNOSIS — D509 Iron deficiency anemia, unspecified: Secondary | ICD-10-CM

## 2017-05-16 LAB — CBC WITH DIFFERENTIAL/PLATELET
Basophils Absolute: 0.1 10*3/uL (ref 0–0.1)
Basophils Relative: 1 %
Eosinophils Absolute: 1.7 10*3/uL — ABNORMAL HIGH (ref 0–0.7)
Eosinophils Relative: 23 %
HCT: 36.7 % — ABNORMAL LOW (ref 40.0–52.0)
Hemoglobin: 12.6 g/dL — ABNORMAL LOW (ref 13.0–18.0)
Lymphocytes Relative: 24 %
Lymphs Abs: 1.8 10*3/uL (ref 1.0–3.6)
MCH: 30.3 pg (ref 26.0–34.0)
MCHC: 34.4 g/dL (ref 32.0–36.0)
MCV: 88 fL (ref 80.0–100.0)
Monocytes Absolute: 0.4 10*3/uL (ref 0.2–1.0)
Monocytes Relative: 6 %
Neutro Abs: 3.5 10*3/uL (ref 1.4–6.5)
Neutrophils Relative %: 46 %
Platelets: 161 10*3/uL (ref 150–440)
RBC: 4.17 MIL/uL — ABNORMAL LOW (ref 4.40–5.90)
RDW: 14.4 % (ref 11.5–14.5)
WBC: 7.6 10*3/uL (ref 3.8–10.6)

## 2017-05-16 LAB — FERRITIN: Ferritin: 164 ng/mL (ref 24–336)

## 2017-05-16 MED ORDER — CYANOCOBALAMIN 1000 MCG/ML IJ SOLN
1000.0000 ug | Freq: Once | INTRAMUSCULAR | Status: AC
  Administered 2017-05-16: 1000 ug via INTRAMUSCULAR
  Filled 2017-05-16: qty 1

## 2017-05-16 NOTE — Patient Instructions (Signed)
Cyanocobalamin, Vitamin B12 injection What is this medicine? CYANOCOBALAMIN (sye an oh koe BAL a min) is a man made form of vitamin B12. Vitamin B12 is used in the growth of healthy blood cells, nerve cells, and proteins in the body. It also helps with the metabolism of fats and carbohydrates. This medicine is used to treat people who can not absorb vitamin B12. This medicine may be used for other purposes; ask your health care provider or pharmacist if you have questions. COMMON BRAND NAME(S): B-12 Compliance Kit, B-12 Injection Kit, Cyomin, LA-12, Nutri-Twelve, Physicians EZ Use B-12, Primabalt What should I tell my health care provider before I take this medicine? They need to know if you have any of these conditions: -kidney disease -Leber's disease -megaloblastic anemia -an unusual or allergic reaction to cyanocobalamin, cobalt, other medicines, foods, dyes, or preservatives -pregnant or trying to get pregnant -breast-feeding How should I use this medicine? This medicine is injected into a muscle or deeply under the skin. It is usually given by a health care professional in a clinic or doctor's office. However, your doctor may teach you how to inject yourself. Follow all instructions. Talk to your pediatrician regarding the use of this medicine in children. Special care may be needed. Overdosage: If you think you have taken too much of this medicine contact a poison control center or emergency room at once. NOTE: This medicine is only for you. Do not share this medicine with others. What if I miss a dose? If you are given your dose at a clinic or doctor's office, call to reschedule your appointment. If you give your own injections and you miss a dose, take it as soon as you can. If it is almost time for your next dose, take only that dose. Do not take double or extra doses. What may interact with this medicine? -colchicine -heavy alcohol intake This list may not describe all possible  interactions. Give your health care provider a list of all the medicines, herbs, non-prescription drugs, or dietary supplements you use. Also tell them if you smoke, drink alcohol, or use illegal drugs. Some items may interact with your medicine. What should I watch for while using this medicine? Visit your doctor or health care professional regularly. You may need blood work done while you are taking this medicine. You may need to follow a special diet. Talk to your doctor. Limit your alcohol intake and avoid smoking to get the best benefit. What side effects may I notice from receiving this medicine? Side effects that you should report to your doctor or health care professional as soon as possible: -allergic reactions like skin rash, itching or hives, swelling of the face, lips, or tongue -blue tint to skin -chest tightness, pain -difficulty breathing, wheezing -dizziness -red, swollen painful area on the leg Side effects that usually do not require medical attention (report to your doctor or health care professional if they continue or are bothersome): -diarrhea -headache This list may not describe all possible side effects. Call your doctor for medical advice about side effects. You may report side effects to FDA at 1-800-FDA-1088. Where should I keep my medicine? Keep out of the reach of children. Store at room temperature between 15 and 30 degrees C (59 and 85 degrees F). Protect from light. Throw away any unused medicine after the expiration date. NOTE: This sheet is a summary. It may not cover all possible information. If you have questions about this medicine, talk to your doctor, pharmacist, or  health care provider.  2018 Elsevier/Gold Standard (2007-05-20 22:10:20)

## 2017-06-13 ENCOUNTER — Inpatient Hospital Stay: Payer: Medicare PPO | Attending: Hematology and Oncology

## 2017-06-13 VITALS — BP 165/89 | HR 56 | Temp 96.3°F | Resp 20

## 2017-06-13 DIAGNOSIS — E538 Deficiency of other specified B group vitamins: Secondary | ICD-10-CM | POA: Diagnosis present

## 2017-06-13 MED ORDER — CYANOCOBALAMIN 1000 MCG/ML IJ SOLN
1000.0000 ug | Freq: Once | INTRAMUSCULAR | Status: AC
  Administered 2017-06-13: 1000 ug via INTRAMUSCULAR

## 2017-06-13 NOTE — Patient Instructions (Signed)
Cyanocobalamin, Vitamin B12 injection What is this medicine? CYANOCOBALAMIN (sye an oh koe BAL a min) is a man made form of vitamin B12. Vitamin B12 is used in the growth of healthy blood cells, nerve cells, and proteins in the body. It also helps with the metabolism of fats and carbohydrates. This medicine is used to treat people who can not absorb vitamin B12. This medicine may be used for other purposes; ask your health care provider or pharmacist if you have questions. COMMON BRAND NAME(S): B-12 Compliance Kit, B-12 Injection Kit, Cyomin, LA-12, Nutri-Twelve, Physicians EZ Use B-12, Primabalt What should I tell my health care provider before I take this medicine? They need to know if you have any of these conditions: -kidney disease -Leber's disease -megaloblastic anemia -an unusual or allergic reaction to cyanocobalamin, cobalt, other medicines, foods, dyes, or preservatives -pregnant or trying to get pregnant -breast-feeding How should I use this medicine? This medicine is injected into a muscle or deeply under the skin. It is usually given by a health care professional in a clinic or doctor's office. However, your doctor may teach you how to inject yourself. Follow all instructions. Talk to your pediatrician regarding the use of this medicine in children. Special care may be needed. Overdosage: If you think you have taken too much of this medicine contact a poison control center or emergency room at once. NOTE: This medicine is only for you. Do not share this medicine with others. What if I miss a dose? If you are given your dose at a clinic or doctor's office, call to reschedule your appointment. If you give your own injections and you miss a dose, take it as soon as you can. If it is almost time for your next dose, take only that dose. Do not take double or extra doses. What may interact with this medicine? -colchicine -heavy alcohol intake This list may not describe all possible  interactions. Give your health care provider a list of all the medicines, herbs, non-prescription drugs, or dietary supplements you use. Also tell them if you smoke, drink alcohol, or use illegal drugs. Some items may interact with your medicine. What should I watch for while using this medicine? Visit your doctor or health care professional regularly. You may need blood work done while you are taking this medicine. You may need to follow a special diet. Talk to your doctor. Limit your alcohol intake and avoid smoking to get the best benefit. What side effects may I notice from receiving this medicine? Side effects that you should report to your doctor or health care professional as soon as possible: -allergic reactions like skin rash, itching or hives, swelling of the face, lips, or tongue -blue tint to skin -chest tightness, pain -difficulty breathing, wheezing -dizziness -red, swollen painful area on the leg Side effects that usually do not require medical attention (report to your doctor or health care professional if they continue or are bothersome): -diarrhea -headache This list may not describe all possible side effects. Call your doctor for medical advice about side effects. You may report side effects to FDA at 1-800-FDA-1088. Where should I keep my medicine? Keep out of the reach of children. Store at room temperature between 15 and 30 degrees C (59 and 85 degrees F). Protect from light. Throw away any unused medicine after the expiration date. NOTE: This sheet is a summary. It may not cover all possible information. If you have questions about this medicine, talk to your doctor, pharmacist, or   health care provider.  2018 Elsevier/Gold Standard (2007-05-20 22:10:20)  

## 2017-06-18 LAB — ACID FAST CULTURE WITH REFLEXED SENSITIVITIES (MYCOBACTERIA): Acid Fast Culture: NEGATIVE

## 2017-06-29 ENCOUNTER — Ambulatory Visit: Payer: Medicare PPO | Admitting: Urology

## 2017-07-04 ENCOUNTER — Ambulatory Visit
Admission: RE | Admit: 2017-07-04 | Discharge: 2017-07-04 | Disposition: A | Discharge status: Home / Self Care | Payer: Medicare PPO | Source: Ambulatory Visit | Attending: Urgent Care | Admitting: Urgent Care

## 2017-07-04 DIAGNOSIS — I2510 Atherosclerotic heart disease of native coronary artery without angina pectoris: Secondary | ICD-10-CM | POA: Insufficient documentation

## 2017-07-04 DIAGNOSIS — I700 Atherosclerosis of aorta: Secondary | ICD-10-CM | POA: Diagnosis not present

## 2017-07-04 DIAGNOSIS — R911 Solitary pulmonary nodule: Secondary | ICD-10-CM | POA: Diagnosis not present

## 2017-07-06 ENCOUNTER — Ambulatory Visit: Payer: Medicare PPO | Admitting: Urology

## 2017-07-11 ENCOUNTER — Inpatient Hospital Stay: Payer: Medicare PPO | Attending: Hematology and Oncology

## 2017-07-11 VITALS — BP 149/89 | HR 85 | Temp 95.8°F | Resp 20

## 2017-07-11 DIAGNOSIS — E538 Deficiency of other specified B group vitamins: Secondary | ICD-10-CM | POA: Diagnosis not present

## 2017-07-11 MED ORDER — CYANOCOBALAMIN 1000 MCG/ML IJ SOLN
1000.0000 ug | Freq: Once | INTRAMUSCULAR | Status: AC
  Administered 2017-07-11: 1000 ug via INTRAMUSCULAR

## 2017-07-11 NOTE — Patient Instructions (Signed)
Cyanocobalamin, Vitamin B12 injection What is this medicine? CYANOCOBALAMIN (sye an oh koe BAL a min) is a man made form of vitamin B12. Vitamin B12 is used in the growth of healthy blood cells, nerve cells, and proteins in the body. It also helps with the metabolism of fats and carbohydrates. This medicine is used to treat people who can not absorb vitamin B12. This medicine may be used for other purposes; ask your health care provider or pharmacist if you have questions. COMMON BRAND NAME(S): B-12 Compliance Kit, B-12 Injection Kit, Cyomin, LA-12, Nutri-Twelve, Physicians EZ Use B-12, Primabalt What should I tell my health care provider before I take this medicine? They need to know if you have any of these conditions: -kidney disease -Leber's disease -megaloblastic anemia -an unusual or allergic reaction to cyanocobalamin, cobalt, other medicines, foods, dyes, or preservatives -pregnant or trying to get pregnant -breast-feeding How should I use this medicine? This medicine is injected into a muscle or deeply under the skin. It is usually given by a health care professional in a clinic or doctor's office. However, your doctor may teach you how to inject yourself. Follow all instructions. Talk to your pediatrician regarding the use of this medicine in children. Special care may be needed. Overdosage: If you think you have taken too much of this medicine contact a poison control center or emergency room at once. NOTE: This medicine is only for you. Do not share this medicine with others. What if I miss a dose? If you are given your dose at a clinic or doctor's office, call to reschedule your appointment. If you give your own injections and you miss a dose, take it as soon as you can. If it is almost time for your next dose, take only that dose. Do not take double or extra doses. What may interact with this medicine? -colchicine -heavy alcohol intake This list may not describe all possible  interactions. Give your health care provider a list of all the medicines, herbs, non-prescription drugs, or dietary supplements you use. Also tell them if you smoke, drink alcohol, or use illegal drugs. Some items may interact with your medicine. What should I watch for while using this medicine? Visit your doctor or health care professional regularly. You may need blood work done while you are taking this medicine. You may need to follow a special diet. Talk to your doctor. Limit your alcohol intake and avoid smoking to get the best benefit. What side effects may I notice from receiving this medicine? Side effects that you should report to your doctor or health care professional as soon as possible: -allergic reactions like skin rash, itching or hives, swelling of the face, lips, or tongue -blue tint to skin -chest tightness, pain -difficulty breathing, wheezing -dizziness -red, swollen painful area on the leg Side effects that usually do not require medical attention (report to your doctor or health care professional if they continue or are bothersome): -diarrhea -headache This list may not describe all possible side effects. Call your doctor for medical advice about side effects. You may report side effects to FDA at 1-800-FDA-1088. Where should I keep my medicine? Keep out of the reach of children. Store at room temperature between 15 and 30 degrees C (59 and 85 degrees F). Protect from light. Throw away any unused medicine after the expiration date. NOTE: This sheet is a summary. It may not cover all possible information. If you have questions about this medicine, talk to your doctor, pharmacist, or   health care provider.  2018 Elsevier/Gold Standard (2007-05-20 22:10:20)  

## 2017-07-19 NOTE — Progress Notes (Signed)
9:33 AM   Jeremy Herrera [birth year] [id number]  Referring provider: Sofie Hartigan, MD [place], [place] [address]  Chief Complaint  Patient presents with  . Benign Prostatic Hypertrophy    1year    HPI: Patient is a 77 year old WM who presents today for a 12 month follow up for BPH with LUTS, nocturia and a history of urinary retention.      BPH with LUTS His IPSS score today is 9, which is moderate LUTS.  He is pleased with his quality of life.   His last IPSS score  was 9/1.  His major complaints are nocturia x 2 and hesitancy.  He denies any dysuria, hematuria or suprapubic pain.  His has had TURP with the removal of a bladder diverticulum in the remote past.  In the last couple of years, he had undergone 2 cystoscopic examinations. Both of the cystoscopic examinations did not identify any significant bladder outlet obstruction.  He also denies any recent fevers, chills, nausea or vomiting.  He does not have a family history of PCa. IPSS    Row Name 07/20/17 0900         International Prostate Symptom Score   How often have you had the sensation of not emptying your bladder?  Less than 1 in 5     How often have you had to urinate less than every two hours?  Less than half the time     How often have you found you stopped and started again several times when you urinated?  Less than 1 in 5 times     How often have you found it difficult to postpone urination?  Not at All     How often have you had a weak urinary stream?  Less than half the time     How often have you had to strain to start urination?  Less than half the time     How many times did you typically get up at night to urinate?  1 Time     Total IPSS Score  9       Quality of Life due to urinary symptoms   If you were to spend the rest of your life with your urinary condition just the way it is now how would you feel about that?  Pleased        Score:  1-7 Mild 8-19 Moderate 20-35  Severe  Nocturia Patient's nocturia has diminished from 3-4 times nightly to 1-2 times nightly. He is quite pleased with these results. Unchanged  History of urinary retention Patient discontinued his Cialis and dutasteride at this time.  He states he is voiding well.  Unchanged.    PMH: Past Medical History:  Diagnosis Date  . Anemia   . BPH (benign prostatic hyperplasia)   . CHF (congestive heart failure) ([place])   . Dysphagia   . GERD (gastroesophageal reflux disease)   . HLD (hyperlipidemia)   . HTN (hypertension)   . Irregular heart beat   . Nocturia   . Urinary retention     Surgical History: Past Surgical History:  Procedure Laterality Date  . AMPUTATION TOE Left 05/04/2017   Procedure: AMPUTATION TOE - third left toe;  Surgeon: Samara Deist, DPM;  Location: [facility] ORS;  Service: Podiatry;  Laterality: Left;  . bladder patching  1964  . HIP SURGERY Right 2014  . IRRIGATION AND DEBRIDEMENT FOOT Left 11/14/2016   Procedure: IRRIGATION AND DEBRIDEMENT  FOOT;  Surgeon: Samara Deist, DPM;  Location: [facility] ORS;  Service: Podiatry;  Laterality: Left;  . TRANSURETHRAL RESECTION OF PROSTATE      Home Medications:  Allergies as of 07/20/2017      Reactions   Hydrocodone-acetaminophen Nausea And Vomiting      Medication List        Accurate as of 07/20/17  9:33 AM. Always use your most recent med list.          acetaminophen 500 MG tablet Commonly known as:  TYLENOL Take by mouth.   aspirin 81 MG tablet Take 81 mg by mouth daily.   atorvastatin 10 MG tablet Commonly known as:  LIPITOR Take 10 mg by mouth daily.   B-12 1000 MCG/ML Kit Inject 1 mL as directed every 30 (thirty) days.   bimatoprost 0.01 % Soln Commonly known as:  LUMIGAN Place 1 drop into both eyes 2 (two) times daily.   cyclobenzaprine 10 MG tablet Commonly known as:  FLEXERIL Take 1 tablet (10 mg total) by mouth 3 (three) times daily as needed for muscle spasms.   dorzolamide-timolol  22.3-6.8 MG/ML ophthalmic solution Commonly known as:  COSOPT Place 1 drop into both eyes 2 (two) times daily.   esomeprazole 20 MG capsule Commonly known as:  NEXIUM Take 20 mg by mouth daily at 12 noon.   ferrous sulfate 325 (65 FE) MG tablet Take 325 mg by mouth daily.   furosemide 20 MG tablet Commonly known as:  LASIX Take 20 mg by mouth daily as needed for fluid. Try to limit to no more than 3-4 times per week.   gabapentin 100 MG capsule Commonly known as:  NEURONTIN Take 1 capsule (100 mg total) by mouth 3 (three) times daily.   traMADol 50 MG tablet Commonly known as:  ULTRAM Take 1 tablet (50 mg total) by mouth every 6 (six) hours as needed for moderate pain.       Allergies:  Allergies  Allergen Reactions  . Hydrocodone-Acetaminophen Nausea And Vomiting    Family History: Family History  Problem Relation Age of Onset  . Ulcers Mother        stomach  . GER disease Sister        GERD  . Heart attack Father   . Diabetes Brother   . Depression Brother   . Kidney disease Neg Hx   . Prostate cancer Neg Hx   . Kidney cancer Neg Hx   . Bladder Cancer Neg Hx     Social History:  reports that he has never smoked. He has never used smokeless tobacco. He reports that he does not drink alcohol or use drugs.  ROS: UROLOGY Frequent Urination?: No Hard to postpone urination?: No Burning/pain with urination?: No Get up at night to urinate?: Yes Leakage of urine?: No Urine stream starts and stops?: Yes Trouble starting stream?: No Do you have to strain to urinate?: No Blood in urine?: No Urinary tract infection?: No Sexually transmitted disease?: No Injury to kidneys or bladder?: No Painful intercourse?: No Weak stream?: No Erection problems?: No Penile pain?: No  Gastrointestinal Nausea?: No Vomiting?: No Indigestion/heartburn?: No Diarrhea?: No Constipation?: No  Constitutional Fever: No Night sweats?: No Weight loss?: No Fatigue?:  No  Skin Skin rash/lesions?: Yes Itching?: Yes  Eyes Blurred vision?: No Double vision?: No  Ears/Nose/Throat Sore throat?: No Sinus problems?: No  Hematologic/Lymphatic Swollen glands?: No Easy bruising?: Yes  Cardiovascular Leg swelling?: Yes Chest pain?: No  Respiratory Cough?: Yes  Shortness of breath?: No  Endocrine Excessive thirst?: No  Musculoskeletal Back pain?: Yes Joint pain?: No  Neurological Headaches?: No Dizziness?: No  Psychologic Depression?: No Anxiety?: No  Physical Exam: BP 115/69   Pulse 93   Wt 215 lb (97.5 kg)   BMI 28.37 kg/m   Constitutional: Well nourished. Alert and oriented, No acute distress. HEENT: [place] AT, moist mucus membranes. Trachea midline, no masses. Cardiovascular: No clubbing, cyanosis, or edema. Respiratory: Normal respiratory effort, no increased work of breathing. GI: Abdomen is soft, non tender, non distended, no abdominal masses. Liver and spleen not palpable.  No hernias appreciated.  Stool sample for occult testing is not indicated.   GU: No CVA tenderness.  No bladder fullness or masses.  Patient with uncircumcised phallus. Foreskin easily retracted Urethral meatus is patent.  No penile discharge. No penile lesions or rashes. Scrotum without lesions, cysts, rashes and/or edema.  Testicles are located scrotally bilaterally. No masses are appreciated in the testicles. Left and right epididymis are normal. Rectal: Patient with  normal sphincter tone. Anus and perineum without scarring or rashes. No rectal masses are appreciated. Prostate is approximately 40 grams, no nodules are appreciated. Seminal vesicles are normal. Skin: No rashes, bruises or suspicious lesions. Lymph: No cervical or inguinal adenopathy. Neurologic: Grossly intact, no focal deficits, moving all 4 extremities. Psychiatric: Normal mood and affect.   Laboratory Data: Lab Results  Component Value Date   WBC 7.6 05/16/2017   HGB 12.6 (L)  05/16/2017   HCT 36.7 (L) 05/16/2017   MCV 88.0 05/16/2017   PLT 161 05/16/2017    Lab Results  Component Value Date   CREATININE 0.91 05/06/2017    Lab Results  Component Value Date   PSA 0.2 10/04/2011   PSA history:  0.4 ng/mL on 06/21/2012  0.1 ng/mL on 07/02/2013  0.1 ng/mL on 04/10/2014  I have reviewed the labs.   Assessment & Plan:    1. BPH with LUTS  - IPSS score is 9/1, it is stable  - Continue conservative management, avoiding bladder irritants and timed voiding's  - RTC in 12 months for I PSS and exam  2. Nocturia:  We will continue to monitor.   3. History of urinary retention:  We will continue to monitor.    Return in about 1 year (around 07/21/2018) for IPSS and exam.  Zara Council, [facility]  [place] [address] [place], [place] [address] [phone]

## 2017-07-20 ENCOUNTER — Ambulatory Visit (INDEPENDENT_AMBULATORY_CARE_PROVIDER_SITE_OTHER): Payer: Medicare PPO | Admitting: Urology

## 2017-07-20 ENCOUNTER — Encounter: Payer: Self-pay | Admitting: Urology

## 2017-07-20 VITALS — BP 115/69 | HR 93 | Wt 215.0 lb

## 2017-07-20 DIAGNOSIS — R351 Nocturia: Secondary | ICD-10-CM | POA: Diagnosis not present

## 2017-07-20 DIAGNOSIS — N401 Enlarged prostate with lower urinary tract symptoms: Secondary | ICD-10-CM

## 2017-07-20 DIAGNOSIS — N138 Other obstructive and reflux uropathy: Secondary | ICD-10-CM

## 2017-07-20 DIAGNOSIS — Z87898 Personal history of other specified conditions: Secondary | ICD-10-CM

## 2017-08-08 ENCOUNTER — Inpatient Hospital Stay: Payer: Medicare PPO | Attending: Hematology and Oncology | Admitting: Hematology and Oncology

## 2017-08-08 ENCOUNTER — Inpatient Hospital Stay: Payer: Medicare PPO

## 2017-08-08 ENCOUNTER — Encounter: Payer: Self-pay | Admitting: Hematology and Oncology

## 2017-08-08 VITALS — BP 126/81 | HR 76 | Temp 97.1°F | Resp 18 | Wt 227.7 lb

## 2017-08-08 DIAGNOSIS — Z89422 Acquired absence of other left toe(s): Secondary | ICD-10-CM

## 2017-08-08 DIAGNOSIS — E538 Deficiency of other specified B group vitamins: Secondary | ICD-10-CM | POA: Insufficient documentation

## 2017-08-08 DIAGNOSIS — R0609 Other forms of dyspnea: Secondary | ICD-10-CM | POA: Diagnosis not present

## 2017-08-08 DIAGNOSIS — D509 Iron deficiency anemia, unspecified: Secondary | ICD-10-CM | POA: Diagnosis not present

## 2017-08-08 DIAGNOSIS — D696 Thrombocytopenia, unspecified: Secondary | ICD-10-CM | POA: Diagnosis not present

## 2017-08-08 DIAGNOSIS — D693 Immune thrombocytopenic purpura: Secondary | ICD-10-CM

## 2017-08-08 LAB — CBC WITH DIFFERENTIAL/PLATELET
Basophils Absolute: 0 10*3/uL (ref 0–0.1)
Basophils Relative: 1 %
Eosinophils Absolute: 0.1 10*3/uL (ref 0–0.7)
Eosinophils Relative: 3 %
HCT: 38.2 % — ABNORMAL LOW (ref 40.0–52.0)
Hemoglobin: 12.8 g/dL — ABNORMAL LOW (ref 13.0–18.0)
Lymphocytes Relative: 22 %
Lymphs Abs: 1 10*3/uL (ref 1.0–3.6)
MCH: 30.5 pg (ref 26.0–34.0)
MCHC: 33.5 g/dL (ref 32.0–36.0)
MCV: 91.1 fL (ref 80.0–100.0)
Monocytes Absolute: 0.3 10*3/uL (ref 0.2–1.0)
Monocytes Relative: 8 %
Neutro Abs: 2.9 10*3/uL (ref 1.4–6.5)
Neutrophils Relative %: 66 %
Platelets: 109 10*3/uL — ABNORMAL LOW (ref 150–440)
RBC: 4.19 MIL/uL — ABNORMAL LOW (ref 4.40–5.90)
RDW: 14.2 % (ref 11.5–14.5)
WBC: 4.4 10*3/uL (ref 3.8–10.6)

## 2017-08-08 LAB — COMPREHENSIVE METABOLIC PANEL
ALT: 20 U/L (ref 17–63)
AST: 26 U/L (ref 15–41)
Albumin: 3.8 g/dL (ref 3.5–5.0)
Alkaline Phosphatase: 145 U/L — ABNORMAL HIGH (ref 38–126)
Anion gap: 9 (ref 5–15)
BUN: 26 mg/dL — ABNORMAL HIGH (ref 6–20)
CO2: 24 mmol/L (ref 22–32)
Calcium: 8.5 mg/dL — ABNORMAL LOW (ref 8.9–10.3)
Chloride: 110 mmol/L (ref 101–111)
Creatinine, Ser: 1.11 mg/dL (ref 0.61–1.24)
GFR calc Af Amer: >60 mL/min (ref >60)
GFR calc non Af Amer: >60 mL/min (ref >60)
Glucose, Bld: 112 mg/dL — ABNORMAL HIGH (ref 65–99)
Potassium: 4.4 mmol/L (ref 3.5–5.1)
Sodium: 143 mmol/L (ref 135–145)
Total Bilirubin: 0.8 mg/dL (ref 0.3–1.2)
Total Protein: 6.8 g/dL (ref 6.5–8.1)

## 2017-08-08 LAB — FOLATE: Folate: 12.6 ng/mL (ref >5.9)

## 2017-08-08 LAB — FERRITIN: Ferritin: 100 ng/mL (ref 24–336)

## 2017-08-08 MED ORDER — CYANOCOBALAMIN 1000 MCG/ML IJ SOLN
1000.0000 ug | Freq: Once | INTRAMUSCULAR | Status: AC
  Administered 2017-08-08: 1000 ug via INTRAMUSCULAR

## 2017-08-08 NOTE — Patient Instructions (Signed)
Cyanocobalamin, Vitamin B12 injection What is this medicine? CYANOCOBALAMIN (sye an oh koe BAL a min) is a man made form of vitamin B12. Vitamin B12 is used in the growth of healthy blood cells, nerve cells, and proteins in the body. It also helps with the metabolism of fats and carbohydrates. This medicine is used to treat people who can not absorb vitamin B12. This medicine may be used for other purposes; ask your health care provider or pharmacist if you have questions. COMMON BRAND NAME(S): B-12 Compliance Kit, B-12 Injection Kit, Cyomin, LA-12, Nutri-Twelve, Physicians EZ Use B-12, Primabalt What should I tell my health care provider before I take this medicine? They need to know if you have any of these conditions: -kidney disease -Leber's disease -megaloblastic anemia -an unusual or allergic reaction to cyanocobalamin, cobalt, other medicines, foods, dyes, or preservatives -pregnant or trying to get pregnant -breast-feeding How should I use this medicine? This medicine is injected into a muscle or deeply under the skin. It is usually given by a health care professional in a clinic or doctor's office. However, your doctor may teach you how to inject yourself. Follow all instructions. Talk to your pediatrician regarding the use of this medicine in children. Special care may be needed. Overdosage: If you think you have taken too much of this medicine contact a poison control center or emergency room at once. NOTE: This medicine is only for you. Do not share this medicine with others. What if I miss a dose? If you are given your dose at a clinic or doctor's office, call to reschedule your appointment. If you give your own injections and you miss a dose, take it as soon as you can. If it is almost time for your next dose, take only that dose. Do not take double or extra doses. What may interact with this medicine? -colchicine -heavy alcohol intake This list may not describe all possible  interactions. Give your health care provider a list of all the medicines, herbs, non-prescription drugs, or dietary supplements you use. Also tell them if you smoke, drink alcohol, or use illegal drugs. Some items may interact with your medicine. What should I watch for while using this medicine? Visit your doctor or health care professional regularly. You may need blood work done while you are taking this medicine. You may need to follow a special diet. Talk to your doctor. Limit your alcohol intake and avoid smoking to get the best benefit. What side effects may I notice from receiving this medicine? Side effects that you should report to your doctor or health care professional as soon as possible: -allergic reactions like skin rash, itching or hives, swelling of the face, lips, or tongue -blue tint to skin -chest tightness, pain -difficulty breathing, wheezing -dizziness -red, swollen painful area on the leg Side effects that usually do not require medical attention (report to your doctor or health care professional if they continue or are bothersome): -diarrhea -headache This list may not describe all possible side effects. Call your doctor for medical advice about side effects. You may report side effects to FDA at 1-800-FDA-1088. Where should I keep my medicine? Keep out of the reach of children. Store at room temperature between 15 and 30 degrees C (59 and 85 degrees F). Protect from light. Throw away any unused medicine after the expiration date. NOTE: This sheet is a summary. It may not cover all possible information. If you have questions about this medicine, talk to your doctor, pharmacist, or   health care provider.  2018 Elsevier/Gold Standard (2007-05-20 22:10:20)  

## 2017-08-08 NOTE — Progress Notes (Signed)
Patient had foot surgery in March (amputation of 3rd toe on left foot).  Patient also saw Dr. Meredeth IdeFleming on 07-31-17.  Brought copy of pulmonary function test today.  Patient offers no complaints today.

## 2017-08-08 NOTE — Progress Notes (Signed)
[facility] day:  08/08/17   Chief Complaint: Jeremy Herrera is a 77 y.o. male with B12 deficiency, iron deficiency, and mild thrombocytopenia (ITP) who is seen for 6 month assessment.  HPI: The patient was last seen in the medical oncology clinic on 02/21/2017.  At that time, he denied any respiratory symptoms.  Exam revealed a swollen and erythematous LEFT foot. He was being seen by podiatry and was on a course of Keflex.  Hematocrit was 39.3. Platelet count was 163,000.  Ferritin was 85.   He has continued monthly B12 (last 07/11/2017).  CBC on 05/16/2017 included a hematocrit of 36.7, hemoglobin 12.6, MCV 88, platelets 161,000, WBC 7600 and ANC 3500.  Ferritin was 164.  High resolution chest CT on 07/04/2017 revealed a spectrum of findings suggestive of a mild nonprogressive fibrotic interstitial lung disease. 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  There were scattered solid pulmonary nodules stable since 07/04/2016 chest CT and considered benign. Ground-glass micronodularity visualized predominantly at the lung bases on the prior scan was largely resolved, compatible with resolved inflammatory nodularity.  During the interim, he has felt "not too bad".  He notes shortness of breath with exertion.  He underwent amputation of his 3rd left toe on 05/04/2017.  He is completely healed.  He is walking well.    He saw Dr. Flo Shanks on 07/31/2017.  PFTs revealed a DLCO 95% and VC 94%.   Past Medical History:  Diagnosis Date  . Anemia   . BPH (benign prostatic hyperplasia)   . CHF (congestive heart failure) ([place])   . Dysphagia   . GERD (gastroesophageal reflux disease)   . HLD (hyperlipidemia)   . HTN (hypertension)   . Irregular heart beat   . Nocturia   . Urinary retention     Past Surgical History:   Procedure Laterality Date  . AMPUTATION TOE Left 05/04/2017   Procedure: AMPUTATION TOE - third left toe;  Surgeon: Samara Deist, DPM;  Location: [facility] ORS;  Service: Podiatry;  Laterality: Left;  . bladder patching  1964  . HIP SURGERY Right 2014  . IRRIGATION AND DEBRIDEMENT FOOT Left 11/14/2016   Procedure: IRRIGATION AND DEBRIDEMENT FOOT;  Surgeon: Samara Deist, DPM;  Location: [facility] ORS;  Service: Podiatry;  Laterality: Left;  . TRANSURETHRAL RESECTION OF PROSTATE      Family History  Problem Relation Age of Onset  . Ulcers Mother        stomach  . GER disease Sister        GERD  . Heart attack Father   . Diabetes Brother   . Depression Brother   . Kidney disease Neg Hx   . Prostate cancer Neg Hx   . Kidney cancer Neg Hx   . Bladder Cancer Neg Hx     Social History:  reports that he has never smoked. He has never used smokeless tobacco. He reports that he does not drink alcohol or use drugs.   He lives in [place].  He is alone today.  Allergies:  Allergies  Allergen Reactions  . Hydrocodone-Acetaminophen Nausea And Vomiting    Current Medications: Current Outpatient Medications  Medication Sig Dispense Refill  . acetaminophen (TYLENOL) 500 MG tablet Take by mouth.    Marland Kitchen aspirin 81 MG tablet Take 81 mg by mouth daily.    Marland Kitchen atorvastatin (LIPITOR) 10 MG tablet  Take 10 mg by mouth daily.    . Cyanocobalamin (B-12) 1000 MCG/ML KIT Inject 1 mL as directed every 30 (thirty) days.    . cyclobenzaprine (FLEXERIL) 10 MG tablet Take 1 tablet (10 mg total) by mouth 3 (three) times daily as needed for muscle spasms. 20 tablet 0  . dorzolamide-timolol (COSOPT) 22.3-6.8 MG/ML ophthalmic solution Place 1 drop into both eyes 2 (two) times daily.     Marland Kitchen esomeprazole (NEXIUM) 20 MG capsule Take 20 mg by mouth daily at 12 noon.    . ferrous sulfate 325 (65 FE) MG tablet Take 325 mg by mouth daily.    . furosemide (LASIX) 20 MG tablet Take 20 mg by mouth daily as needed for fluid. Try to  limit to no more than 3-4 times per week.    . gabapentin (NEURONTIN) 100 MG capsule Take 1 capsule (100 mg total) by mouth 3 (three) times daily.     No current facility-administered medications for this visit.     Review of Systems:  GENERAL:  Feels "not too bad".  Very active in the summer. No fevers, sweats or weight loss.  Weight up 11 pounds. PERFORMANCE STATUS (ECOG):  1-2 HEENT:  No visual changes, runny nose, sore throat, mouth sores or tenderness. Lungs: Shortness of breath with exertion.  No cough.  No hemoptysis. Cardiac:  No chest pain, palpitations, orthopnea, or PND. GI:  Swallowing difficulties.  No nausea, vomiting, diarrhea, constipation, melena or hematochezia. GU:  No urgency, frequency, dysuria, or hematuria. Musculoskeletal:  s/p amputation left 3rd toe.  No back pain.  No joint pain.  No muscle tenderness. Extremities:  No pain or swelling. Skin:  Bruising.  No rashes or skin changes. Neuro:  No headache, numbness or weakness, balance or coordination issues. Endocrine:  No diabetes, thyroid issues, hot flashes or night sweats. Psych:  No mood changes, depression or anxiety. Pain:  No focal pain. Review of systems:  All other systems reviewed and found to be negative.   Physical Exam: Blood pressure 126/81, pulse 76, temperature (!) 97.1 F (36.2 C), temperature source Tympanic, resp. rate 18, weight 227 lb 11.8 oz (103.3 kg). GENERAL:  Well developed, well nourished, tall gentleman sitting comfortably in the exam room in no acute distress.   MENTAL STATUS:  Alert and oriented to person, place and time. HEAD:  Thin gray hair.  Normocephalic, atraumatic, face symmetric, no Cushingoid features. EYES:  Glasses.  Blue eyes.  Pupils equal round and reactive to light and accomodation.  No conjunctivitis or scleral icterus. ENT:  Clears throat regularly.  Oropharynx clear without lesion.  Tongue normal. Mucous membranes moist.  RESPIRATORY:  Clear to auscultation without  rales, wheezes or rhonchi. CARDIOVASCULAR:  Regular rate and rhythm without murmur, rub or gallop. ABDOMEN:  Soft, non-tender, with active bowel sounds, and no hepatosplenomegaly.  No masses. SKIN:  Upper extremity ecchymosis.  No rashes, ulcers or lesions. EXTREMITIES: No edema, no skin discoloration or tenderness.  No palpable cords. LYMPH NODES: No palpable cervical, supraclavicular, axillary or inguinal adenopathy  NEUROLOGICAL: Unremarkable. PSYCH:  Appropriate.    Appointment on 08/08/2017  Component Date Value Ref Range Status  . Folate 08/08/2017 12.6  >5.9 ng/mL Final   Performed at [facility], [address] [place], [place] [address]  . Ferritin 08/08/2017 100  24 - 336 ng/mL Final   Performed at [facility], [address] [place], [place] [address]  . Sodium 08/08/2017 143  135 - 145 mmol/L  Final  . Potassium 08/08/2017 4.4  3.5 - 5.1 mmol/L Final  . Chloride 08/08/2017 110  101 - 111 mmol/L Final  . CO2 08/08/2017 24  22 - 32 mmol/L Final  . Glucose, Bld 08/08/2017 112* 65 - 99 mg/dL Final  . BUN 08/08/2017 26* 6 - 20 mg/dL Final  . Creatinine, Ser 08/08/2017 1.11  0.61 - 1.24 mg/dL Final  . Calcium 08/08/2017 8.5* 8.9 - 10.3 mg/dL Final  . Total Protein 08/08/2017 6.8  6.5 - 8.1 g/dL Final  . Albumin 08/08/2017 3.8  3.5 - 5.0 g/dL Final  . AST 08/08/2017 26  15 - 41 U/L Final  . ALT 08/08/2017 20  17 - 63 U/L Final  . Alkaline Phosphatase 08/08/2017 145* 38 - 126 U/L Final  . Total Bilirubin 08/08/2017 0.8  0.3 - 1.2 mg/dL Final  . GFR calc non Af Amer 08/08/2017 >60  >60 mL/min Final  . GFR calc Af Amer 08/08/2017 >60  >60 mL/min Final   Comment: (NOTE) The eGFR has been calculated using the CKD EPI equation. This calculation has not been validated in all clinical situations. eGFR's persistently <60 mL/min signify possible Chronic Kidney Disease.   Georgiann Hahn gap 08/08/2017 9  5 - 15 Final   Performed at [facility], [address] [place], [place] [address]  . WBC 08/08/2017 4.4  3.8 - 10.6 K/uL Final  . RBC 08/08/2017 4.19* 4.40 - 5.90 MIL/uL Final  . Hemoglobin 08/08/2017 12.8* 13.0 - 18.0 g/dL Final  . HCT 08/08/2017 38.2* 40.0 - 52.0 % Final  . MCV 08/08/2017 91.1  80.0 - 100.0 fL Final  . MCH 08/08/2017 30.5  26.0 - 34.0 pg Final  . MCHC 08/08/2017 33.5  32.0 - 36.0 g/dL Final  . RDW 08/08/2017 14.2  11.5 - 14.5 % Final  . Platelets 08/08/2017 109* 150 - 440 K/uL Final  . Neutrophils Relative % 08/08/2017 66  % Final  . Neutro Abs 08/08/2017 2.9  1.4 - 6.5 K/uL Final  . Lymphocytes Relative 08/08/2017 22  % Final  . Lymphs Abs 08/08/2017 1.0  1.0 - 3.6 K/uL Final  . Monocytes Relative 08/08/2017 8  % Final  . Monocytes Absolute 08/08/2017 0.3  0.2 - 1.0 K/uL Final  . Eosinophils Relative 08/08/2017 3  % Final  . Eosinophils Absolute 08/08/2017 0.1  0 - 0.7 K/uL Final  . Basophils Relative 08/08/2017 1  % Final  . Basophils Absolute 08/08/2017 0.0  0 - 0.1 K/uL Final   Performed at [facility], [address], [place], [place] [address]    Assessment:  ORON WESTRUP is a 77 y.o. 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  He denies any new medications or herbal products. He has no known autoimmune disease. He has never required transfusion.   Work-up on 09/16/2014 revealed a hematocrit 36.4, hemoglobin 12.3, MCV 91.5, platelets 124,000 white count 4800 with an ANC of 3000. Differential was unremarkable. B12 was 262 (low normal) with an elevated MMA consistent with B12 deficiency.  He began weekly 10/14/2014 (last 07/11/2017).  Folate was 18.1 on 08/30/2016 and 12.6 on 08/08/2017.  Normal labs included: folate, CMP, ANA, iron studies, hepatitis C antibody, hepatitis B surface antigen and core antibody, HIV testing, SPEP, free light chains, and PTT. TSH  was normal on 08/25/2014.  UPEP revealed no  monoclonal protein.  He has a history of iron deficiency anemia.  EGD and colonoscopy 10 years ago were negative. He required esophageal dilatation. He has a history of reflux. Colonoscopy 3-4 years ago was unremarkable per the patient.  His diet is good.  He eats meat twice a week.  He is on oral iron.  Ferritin has been followed: 61 on 11/24/2015, 73 on 02/23/2016, 79 on 05/24/2016, 83 on 07/04/2016, 87 on 08/30/2016, 85 on 02/21/2017, and 164 on 05/16/2017.  He was diagnosed with a community acquired pneumonia on 05/20/2015.  He is completing a Z pack.  Chest CT angiogram on 05/20/2015 revealed a nodular focus of consolidation (indeterminate) for which neoplasm could not be completely excluded. Recommendation was for chest CT without contrast 1-2 months.   Chest CT on 07/05/2015 revealed resolved medial right lower lobe consolidation with residual mild ground-glass opacity.  There was mild patchy subpleural reticulation and ground-glass attenuation in both lungs, cannot exclude interstitial lung disease such as nonspecific interstitial pneumonia (NSIP).   High resolution chest CT on 07/04/2016 was compatible with interstitial lung disease. The overall pattern of disease was nonspecific, however, there was clear evidence of progression compared to the prior study 07/05/2015. Findings are worrisome for potential usual interstitial pneumonia (UIP), however, this may simply reflect chronic hypersensitivity pneumonitis or nonspecific interstitial pneumonia (NSIP). Accordingly, repeat high-resolution chest CT is recommended in 12 months to assess for temporal changes in the appearance of the lung parenchyma.  Most pulmonary nodules are stable except a 7 x 5 mm nodule in the left lower lobe which has slightly enlarged. Attention at time of follow-up examination in 1 year was recommended.    High resolution chest CT on 07/04/2017 revealed a spectrum of findings suggestive of a mild nonprogressive fibrotic  interstitial lung disease. 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  There were scattered solid pulmonary nodules stable since 07/04/2016 chest CT and considered benign. Ground-glass micronodularity visualized predominantly at the lung bases on the prior scan was largely resolved, compatible with resolved inflammatory nodularity.  He has a history of an elevated alkaline phosphatase (138-151) without trend since 09/2011.  Symptomatically, he is doing well.  Exam reveals upper extremity ecchymosis.  Hematocrit is 38.2. Platelet count is 109,000.  Ferritin is 100.   Plan: 1.  Labs today:  CBC with diff, ferritin, folate. 2.  Review interval high resolution chest CT.  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 Discuss ongoing follow-up with pulmonary medicine. 3.  B12 today and monthly x 5. 4.  RTC in 3 months for labs (CBC with diff, ferritin) + B12 5.  RTC in 6 months for MD assessment, labs (CBC with diff, CMP, ferritin), and B12.   Lequita Asal, MD  08/08/2017, 4:35 PM

## 2017-08-19 DIAGNOSIS — R519 Headache, unspecified: Secondary | ICD-10-CM | POA: Insufficient documentation

## 2017-08-19 DIAGNOSIS — R4182 Altered mental status, unspecified: Secondary | ICD-10-CM | POA: Insufficient documentation

## 2017-08-19 DIAGNOSIS — H9319 Tinnitus, unspecified ear: Secondary | ICD-10-CM | POA: Insufficient documentation

## 2017-09-05 ENCOUNTER — Inpatient Hospital Stay: Payer: Medicare PPO

## 2017-10-03 ENCOUNTER — Inpatient Hospital Stay: Payer: Medicare PPO | Attending: Hematology and Oncology

## 2017-10-03 VITALS — BP 174/83 | HR 89 | Temp 94.8°F | Resp 20

## 2017-10-03 DIAGNOSIS — E538 Deficiency of other specified B group vitamins: Secondary | ICD-10-CM

## 2017-10-03 MED ORDER — CYANOCOBALAMIN 1000 MCG/ML IJ SOLN
1000.0000 ug | Freq: Once | INTRAMUSCULAR | Status: AC
  Administered 2017-10-03: 1000 ug via INTRAMUSCULAR

## 2017-10-03 NOTE — Patient Instructions (Signed)
Cyanocobalamin, Vitamin B12 injection What is this medicine? CYANOCOBALAMIN (sye an oh koe BAL a min) is a man made form of vitamin B12. Vitamin B12 is used in the growth of healthy blood cells, nerve cells, and proteins in the body. It also helps with the metabolism of fats and carbohydrates. This medicine is used to treat people who can not absorb vitamin B12. This medicine may be used for other purposes; ask your health care provider or pharmacist if you have questions. COMMON BRAND NAME(S): B-12 Compliance Kit, B-12 Injection Kit, Cyomin, LA-12, Nutri-Twelve, Physicians EZ Use B-12, Primabalt What should I tell my health care provider before I take this medicine? They need to know if you have any of these conditions: -kidney disease -Leber's disease -megaloblastic anemia -an unusual or allergic reaction to cyanocobalamin, cobalt, other medicines, foods, dyes, or preservatives -pregnant or trying to get pregnant -breast-feeding How should I use this medicine? This medicine is injected into a muscle or deeply under the skin. It is usually given by a health care professional in a clinic or doctor's office. However, your doctor may teach you how to inject yourself. Follow all instructions. Talk to your pediatrician regarding the use of this medicine in children. Special care may be needed. Overdosage: If you think you have taken too much of this medicine contact a poison control center or emergency room at once. NOTE: This medicine is only for you. Do not share this medicine with others. What if I miss a dose? If you are given your dose at a clinic or doctor's office, call to reschedule your appointment. If you give your own injections and you miss a dose, take it as soon as you can. If it is almost time for your next dose, take only that dose. Do not take double or extra doses. What may interact with this medicine? -colchicine -heavy alcohol intake This list may not describe all possible  interactions. Give your health care provider a list of all the medicines, herbs, non-prescription drugs, or dietary supplements you use. Also tell them if you smoke, drink alcohol, or use illegal drugs. Some items may interact with your medicine. What should I watch for while using this medicine? Visit your doctor or health care professional regularly. You may need blood work done while you are taking this medicine. You may need to follow a special diet. Talk to your doctor. Limit your alcohol intake and avoid smoking to get the best benefit. What side effects may I notice from receiving this medicine? Side effects that you should report to your doctor or health care professional as soon as possible: -allergic reactions like skin rash, itching or hives, swelling of the face, lips, or tongue -blue tint to skin -chest tightness, pain -difficulty breathing, wheezing -dizziness -red, swollen painful area on the leg Side effects that usually do not require medical attention (report to your doctor or health care professional if they continue or are bothersome): -diarrhea -headache This list may not describe all possible side effects. Call your doctor for medical advice about side effects. You may report side effects to FDA at 1-800-FDA-1088. Where should I keep my medicine? Keep out of the reach of children. Store at room temperature between 15 and 30 degrees C (59 and 85 degrees F). Protect from light. Throw away any unused medicine after the expiration date. NOTE: This sheet is a summary. It may not cover all possible information. If you have questions about this medicine, talk to your doctor, pharmacist, or   health care provider.  2018 Elsevier/Gold Standard (2007-05-20 22:10:20)  

## 2017-10-31 ENCOUNTER — Inpatient Hospital Stay: Payer: Medicare PPO

## 2017-10-31 ENCOUNTER — Inpatient Hospital Stay: Payer: Medicare PPO | Attending: Hematology and Oncology

## 2017-10-31 VITALS — BP 116/66 | HR 85 | Temp 96.7°F | Resp 20

## 2017-10-31 DIAGNOSIS — D509 Iron deficiency anemia, unspecified: Secondary | ICD-10-CM

## 2017-10-31 DIAGNOSIS — D696 Thrombocytopenia, unspecified: Secondary | ICD-10-CM | POA: Diagnosis not present

## 2017-10-31 DIAGNOSIS — E538 Deficiency of other specified B group vitamins: Secondary | ICD-10-CM | POA: Insufficient documentation

## 2017-10-31 LAB — FERRITIN: Ferritin: 87 ng/mL (ref 24–336)

## 2017-10-31 LAB — CBC WITH DIFFERENTIAL/PLATELET
Basophils Absolute: 0 10*3/uL (ref 0–0.1)
Basophils Relative: 1 %
Eosinophils Absolute: 0.1 10*3/uL (ref 0–0.7)
Eosinophils Relative: 2 %
HCT: 38 % — ABNORMAL LOW (ref 40.0–52.0)
Hemoglobin: 13.1 g/dL (ref 13.0–18.0)
Lymphocytes Relative: 26 %
Lymphs Abs: 1.4 10*3/uL (ref 1.0–3.6)
MCH: 32 pg (ref 26.0–34.0)
MCHC: 34.5 g/dL (ref 32.0–36.0)
MCV: 92.7 fL (ref 80.0–100.0)
Monocytes Absolute: 0.3 10*3/uL (ref 0.2–1.0)
Monocytes Relative: 6 %
Neutro Abs: 3.6 10*3/uL (ref 1.4–6.5)
Neutrophils Relative %: 65 %
Platelets: 120 10*3/uL — ABNORMAL LOW (ref 150–440)
RBC: 4.1 MIL/uL — ABNORMAL LOW (ref 4.40–5.90)
RDW: 13.8 % (ref 11.5–14.5)
WBC: 5.4 10*3/uL (ref 3.8–10.6)

## 2017-10-31 MED ORDER — CYANOCOBALAMIN 1000 MCG/ML IJ SOLN
1000.0000 ug | Freq: Once | INTRAMUSCULAR | Status: AC
  Administered 2017-10-31: 1000 ug via INTRAMUSCULAR
  Filled 2017-10-31: qty 1

## 2017-11-07 ENCOUNTER — Other Ambulatory Visit: Payer: Medicare PPO

## 2017-11-14 ENCOUNTER — Other Ambulatory Visit: Payer: Medicare PPO

## 2017-11-21 ENCOUNTER — Other Ambulatory Visit: Payer: Medicare PPO

## 2017-11-28 ENCOUNTER — Inpatient Hospital Stay: Payer: Medicare PPO | Attending: Hematology and Oncology

## 2017-11-28 VITALS — BP 126/73 | HR 88 | Resp 20

## 2017-11-28 DIAGNOSIS — E538 Deficiency of other specified B group vitamins: Secondary | ICD-10-CM | POA: Insufficient documentation

## 2017-11-28 MED ORDER — CYANOCOBALAMIN 1000 MCG/ML IJ SOLN
1000.0000 ug | Freq: Once | INTRAMUSCULAR | Status: AC
  Administered 2017-11-28: 1000 ug via INTRAMUSCULAR
  Filled 2017-11-28: qty 1

## 2017-11-28 NOTE — Patient Instructions (Signed)
Cyanocobalamin, Vitamin B12 injection What is this medicine? CYANOCOBALAMIN (sye an oh koe BAL a min) is a man made form of vitamin B12. Vitamin B12 is used in the growth of healthy blood cells, nerve cells, and proteins in the body. It also helps with the metabolism of fats and carbohydrates. This medicine is used to treat people who can not absorb vitamin B12. This medicine may be used for other purposes; ask your health care provider or pharmacist if you have questions. COMMON BRAND NAME(S): B-12 Compliance Kit, B-12 Injection Kit, Cyomin, LA-12, Nutri-Twelve, Physicians EZ Use B-12, Primabalt What should I tell my health care provider before I take this medicine? They need to know if you have any of these conditions: -kidney disease -Leber's disease -megaloblastic anemia -an unusual or allergic reaction to cyanocobalamin, cobalt, other medicines, foods, dyes, or preservatives -pregnant or trying to get pregnant -breast-feeding How should I use this medicine? This medicine is injected into a muscle or deeply under the skin. It is usually given by a health care professional in a clinic or doctor's office. However, your doctor may teach you how to inject yourself. Follow all instructions. Talk to your pediatrician regarding the use of this medicine in children. Special care may be needed. Overdosage: If you think you have taken too much of this medicine contact a poison control center or emergency room at once. NOTE: This medicine is only for you. Do not share this medicine with others. What if I miss a dose? If you are given your dose at a clinic or doctor's office, call to reschedule your appointment. If you give your own injections and you miss a dose, take it as soon as you can. If it is almost time for your next dose, take only that dose. Do not take double or extra doses. What may interact with this medicine? -colchicine -heavy alcohol intake This list may not describe all possible  interactions. Give your health care provider a list of all the medicines, herbs, non-prescription drugs, or dietary supplements you use. Also tell them if you smoke, drink alcohol, or use illegal drugs. Some items may interact with your medicine. What should I watch for while using this medicine? Visit your doctor or health care professional regularly. You may need blood work done while you are taking this medicine. You may need to follow a special diet. Talk to your doctor. Limit your alcohol intake and avoid smoking to get the best benefit. What side effects may I notice from receiving this medicine? Side effects that you should report to your doctor or health care professional as soon as possible: -allergic reactions like skin rash, itching or hives, swelling of the face, lips, or tongue -blue tint to skin -chest tightness, pain -difficulty breathing, wheezing -dizziness -red, swollen painful area on the leg Side effects that usually do not require medical attention (report to your doctor or health care professional if they continue or are bothersome): -diarrhea -headache This list may not describe all possible side effects. Call your doctor for medical advice about side effects. You may report side effects to FDA at 1-800-FDA-1088. Where should I keep my medicine? Keep out of the reach of children. Store at room temperature between 15 and 30 degrees C (59 and 85 degrees F). Protect from light. Throw away any unused medicine after the expiration date. NOTE: This sheet is a summary. It may not cover all possible information. If you have questions about this medicine, talk to your doctor, pharmacist, or   health care provider.  2018 Elsevier/Gold Standard (2007-05-20 22:10:20)  

## 2017-12-26 ENCOUNTER — Inpatient Hospital Stay: Payer: Medicare PPO | Attending: Hematology and Oncology

## 2017-12-26 VITALS — BP 165/89 | HR 46 | Temp 96.6°F | Resp 18

## 2017-12-26 DIAGNOSIS — E538 Deficiency of other specified B group vitamins: Secondary | ICD-10-CM | POA: Diagnosis not present

## 2017-12-26 MED ORDER — CYANOCOBALAMIN 1000 MCG/ML IJ SOLN
1000.0000 ug | Freq: Once | INTRAMUSCULAR | Status: AC
  Administered 2017-12-26: 1000 ug via INTRAMUSCULAR
  Filled 2017-12-26: qty 1

## 2018-01-07 ENCOUNTER — Encounter: Payer: Self-pay | Admitting: Emergency Medicine

## 2018-01-07 ENCOUNTER — Other Ambulatory Visit: Payer: Self-pay

## 2018-01-07 ENCOUNTER — Ambulatory Visit
Admission: EM | Admit: 2018-01-07 | Discharge: 2018-01-07 | Disposition: A | Discharge status: Home / Self Care | Payer: Medicare PPO | Attending: Family Medicine | Admitting: Family Medicine

## 2018-01-07 DIAGNOSIS — L03116 Cellulitis of left lower limb: Secondary | ICD-10-CM | POA: Diagnosis not present

## 2018-01-07 MED ORDER — CEPHALEXIN 500 MG PO CAPS: 500.0000 mg | ORAL_CAPSULE | Freq: Three times a day (TID) | ORAL | 0 refills | Status: DC

## 2018-01-07 NOTE — ED Triage Notes (Signed)
Patient in today c/o left lower leg pain, swelling and redness x 3 days. Progressively getting worse. No injury noted.

## 2018-01-07 NOTE — Discharge Instructions (Signed)
Warm compresses to the leg Go to Emergency Department if symptoms get worse or are not improving on the oral antibitoic

## 2018-01-07 NOTE — ED Provider Notes (Signed)
[facility]    CSN: [id number] Arrival date & time: 01/07/18  1256     History   Chief Complaint Chief Complaint  Patient presents with  . Cellulitis    left lower leg    HPI [name] is a 77 y.o. male.   77 yo male with a c/o 3-4 days of redness and swelling to the front part of his lower left leg. Denies any injuries, falls, fevers, chills, chest pains, shortness of breath.  Patient is diabetic and has a h/o foot ulcer in the past.   The history is provided by the patient.    Past Medical History:  Diagnosis Date  . Anemia   . BPH (benign prostatic hyperplasia)   . CHF (congestive heart failure) ([place])   . Dysphagia   . GERD (gastroesophageal reflux disease)   . HLD (hyperlipidemia)   . HTN (hypertension)   . Irregular heart beat   . Nocturia   . Urinary retention     Patient Active Problem List   Diagnosis Date Noted  . 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[place]) 05/02/2017  . Lung nodule 03/11/2017  . Nausea 12/18/2016  . Diabetic foot ulcer ([place]) 11/13/2016  . Nocturia 10/17/2014  . History of urinary retention 10/17/2014  . 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  . B12 deficiency 09/30/2014  . Right leg swelling 09/30/2014  . Anemia 09/16/2014  . Thrombocytopenia ([place]) 09/16/2014  . Benign essential HTN 06/08/2014  . 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 Beat, premature ventricular 12/03/2013  . Heart valve disease 12/03/2013  . 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 Diabetes mellitus, type 2 ([place]) 11/20/2013  . Acid reflux 11/20/2013  . Anemia, iron deficiency 11/20/2013  . Abnormal presence of protein in urine 11/20/2013  . 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 AMPUTATION TOE Left 05/04/2017   Procedure: AMPUTATION TOE - third left toe;  Surgeon: Samara Deist, DPM;  Location: [facility] ORS;  Service: Podiatry;   Laterality: Left;  . bladder patching  1964  . HIP SURGERY Right 2014  . IRRIGATION AND DEBRIDEMENT FOOT Left 11/14/2016   Procedure: IRRIGATION AND DEBRIDEMENT FOOT;  Surgeon: Samara Deist, DPM;  Location: [facility] ORS;  Service: Podiatry;  Laterality: Left;  . TRANSURETHRAL RESECTION OF PROSTATE         Home Medications    Prior to Admission medications   Medication Sig Start Date End Date Taking? Authorizing Provider  acetaminophen (TYLENOL) 500 MG tablet Take by mouth.   Yes [provider]  aspirin 81 MG tablet Take 81 mg by mouth daily.   Yes [provider]  atorvastatin (LIPITOR) 10 MG tablet Take 10 mg by mouth daily.   Yes [provider]  bimatoprost (LUMIGAN) 0.01 % SOLN 1 drop nightly   Yes [provider]  Cyanocobalamin (B-12) 1000 MCG/ML KIT Inject 1 mL as directed every 30 (thirty) days.   Yes [provider]  donepezil (ARICEPT) 5 MG tablet Take by mouth. 08/22/17  Yes [provider]  dorzolamide-timolol (COSOPT) 22.3-6.8 MG/ML ophthalmic solution Place 1 drop into both eyes 2 (two) times daily.    Yes [provider]  esomeprazole (NEXIUM) 20 MG capsule Take 20 mg by mouth daily at 12 noon.   Yes [provider]  ferrous sulfate 325 (65 FE) MG tablet Take 325 mg by mouth daily.   Yes [provider]  furosemide (LASIX) 20 MG tablet Take 20 mg by mouth daily as needed for fluid. Try to limit to no more than 3-4 times per week. 11/23/14  Yes [provider]  cephALEXin (KEFLEX) 500 MG capsule Take 1 capsule (500 mg total) by mouth 3 (three) times daily. 01/07/18   Norval Gable, MD  cyclobenzaprine (FLEXERIL) 10 MG tablet Take 1 tablet (10 mg total) by mouth 3 (three) times daily as needed for muscle spasms. 05/05/17   Loletha Grayer, MD  gabapentin (NEURONTIN) 100 MG capsule Take 1 capsule (100 mg total) by mouth 3 (three) times daily. 11/17/16   [name], MD    Family  History Family History  Problem Relation Age of Onset  . Ulcers Mother        stomach  . GER disease Sister        GERD  . Heart attack Father   . Diabetes Brother   . Depression Brother   . Kidney disease Neg Hx   . Prostate cancer Neg Hx   . Kidney cancer Neg Hx   . Bladder Cancer Neg Hx     Social History Social History   Tobacco Use  . Smoking status: Never Smoker  . Smokeless tobacco: Never Used  Substance Use Topics  . Alcohol use: No  . Drug use: No     Allergies   Hydrocodone-acetaminophen   Review of Systems Review of Systems   Physical Exam Triage Vital Signs ED Triage Vitals  Enc Vitals Group     BP 01/07/18 1314 (!) 168/99     Pulse Rate 01/07/18 1314 (!) 49     Resp 01/07/18 1314 16     Temp 01/07/18 1314 97.9 F (36.6 C)     Temp Source 01/07/18 1314 Oral     SpO2 01/07/18 1314 99 %     Weight 01/07/18 1314 215 lb (97.5 kg)     Height 01/07/18 1314 _0  (1.854 m)     Head Circumference --      Peak Flow --      Pain Score 01/07/18 1313 2     Pain Loc --      Pain Edu? --      Excl. in [place]? --    No data found.  Updated Vital Signs BP (!) 168/99 (BP Location: Left Arm)   Pulse (!) 49   Temp 97.9 F (36.6 C) (Oral)   Resp 16   Ht _1  (1.854 m)   Wt 97.5 kg   SpO2 99%   BMI 28.37 kg/m   Visual Acuity Right Eye Distance:   Left Eye Distance:   Bilateral Distance:    Right Eye Near:   Left Eye Near:    Bilateral Near:     Physical Exam  Constitutional: He appears well-developed and well-nourished. No distress.  Skin: He is not diaphoretic.  Left lower leg (shin) edema, erythema and tenderness to palpation; area 11x8 in of erythema  Nursing note and vitals reviewed.    UC Treatments / Results  Labs (all labs ordered are listed, but only abnormal results are displayed) Labs Reviewed - No data to display  EKG None  Radiology No results found.  Procedures Procedures (including critical care time)  Medications  Ordered in UC Medications - No data to display  Initial Impression / Assessment and Plan / UC Course  I have reviewed the triage vital signs and the nursing notes.  Pertinent labs & imaging results that were  available during my care of the patient were reviewed by me and considered in my medical decision making (see chart for details).      Final Clinical Impressions(s) / UC Diagnoses   Final diagnoses:  Cellulitis of left lower extremity     Discharge Instructions     Warm compresses to the leg Go to Emergency Department if symptoms get worse or are not improving on the oral antibitoic     ED Prescriptions    Medication Sig Dispense Auth. Provider   cephALEXin (KEFLEX) 500 MG capsule Take 1 capsule (500 mg total) by mouth 3 (three) times daily. 30 capsule Norval Gable, MD     1. diagnosis reviewed with patient 2. rx as per orders above; reviewed possible side effects, interactions, risks and benefits  3. Recommend supportive treatment as above 4. Follow-up prn if symptoms worsen or don't improve   Controlled Substance Prescriptions [place] Controlled Substance Registry consulted? Not Applicable   Norval Gable, MD 01/07/18 [phone]

## 2018-01-22 NOTE — Progress Notes (Signed)
[facility] day:  01/23/2018  Chief Complaint: Jeremy Herrera is a 77 y.o. male with B12 deficiency, iron deficiency, and mild thrombocytopenia (ITP) who is seen for 6 month assessment.  HPI: The patient was last seen in the medical oncology clinic on 08/08/2017.  At that time, patient was feeling "not too bad". He complained of chronic exertional dyspnea. 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 Area healed without complication. No ambulation difficulties. Exam revealed bruising to his BILATERAL upper extremities.  Hematocrit 38.2. Platelets 109,000.  Ferritin 100.   Patient continues on monthly parenteral B12 supplementation. His last injection was received on 12/26/2017.  CBC on 10/31/2017 revealed a WBC of 5400 ([place] 3600). Hemoglobin 13.1, hematocrit 38.0, MCV 92.7, and platelets 120,000. Ferritin level stable at 87 ng/mL.   Patient was seen in [place] on 01/07/2018 by Dr. Norval Gable. Notes reviewed. Patient with pain, swelling, and erythema in his LEFT lower extremity x 3 days. Symptom constellation felt to be consistent with cellulitis. Patient prescribed a 10 day course of oral cephalexin.  In the interim, patient has been doing well overall. Energy is stable. He has completed his antibiotic course as prescribed. Cellulitis to LEFT lower extremity has resolved. Patient denies any fevers or sweats. Patient denies bleeding; no hematochezia, melena, or gross hematuria.   Patient notes increased issues with his balance. He has been seen in consult down at [facility] by ENT. Notes reviewed. Videonystagmography (VNG) demonstrated a 47% LEFT sided unilateral weakness that was centrally compensated. Balance issues felt to be multifactorial and related to the vestibular weakness, neuropathy, and overall level of physical deconditioning. Physical therapy was recommended.   Patient advises that he  maintains an adequate appetite. He is eating well. Weight today is 227 lb 8.2 oz (103.2 kg), which compared to his last visit to the clinic, represents a stable weight.  Patient denies pain in the clinic today.  Past Medical History:  Diagnosis Date  . Anemia   . BPH (benign prostatic hyperplasia)   . CHF (congestive heart failure) ([place])   . Dysphagia   . GERD (gastroesophageal reflux disease)   . HLD (hyperlipidemia)   . HTN (hypertension)   . Irregular heart beat   . Nocturia   . Urinary retention     Past Surgical History:  Procedure Laterality Date  . AMPUTATION TOE Left 05/04/2017   Procedure: AMPUTATION TOE - third left toe;  Surgeon: Samara Deist, DPM;  Location: [facility] ORS;  Service: Podiatry;  Laterality: Left;  . bladder patching  1964  . HIP SURGERY Right 2014  . IRRIGATION AND DEBRIDEMENT FOOT Left 11/14/2016   Procedure: IRRIGATION AND DEBRIDEMENT FOOT;  Surgeon: Samara Deist, DPM;  Location: [facility] ORS;  Service: Podiatry;  Laterality: Left;  . TRANSURETHRAL RESECTION OF PROSTATE      Family History  Problem Relation Age of Onset  . Ulcers Mother        stomach  . GER disease Sister        GERD  . Heart attack Father   . Diabetes Brother   . Depression Brother   . Kidney disease Neg Hx   . Prostate cancer Neg Hx   . Kidney cancer Neg Hx   . Bladder Cancer Neg Hx     Social History:  reports that he has never smoked. He has never used smokeless tobacco. He reports that he does not  drink alcohol or use drugs.   He lives in [place].  He is alone today.  Allergies:  Allergies  Allergen Reactions  . Hydrocodone-Acetaminophen Nausea And Vomiting    Current Medications: Current Outpatient Medications  Medication Sig Dispense Refill  . acetaminophen (TYLENOL) 500 MG tablet Take by mouth.    Marland Kitchen aspirin 81 MG tablet Take 81 mg by mouth daily.    Marland Kitchen atorvastatin (LIPITOR) 10 MG tablet Take 10 mg by mouth daily.    . bimatoprost (LUMIGAN) 0.01 % SOLN 1 drop  nightly    . cephALEXin (KEFLEX) 500 MG capsule Take 1 capsule (500 mg total) by mouth 3 (three) times daily. 30 capsule 0  . Cyanocobalamin (B-12) 1000 MCG/ML KIT Inject 1 mL as directed every 30 (thirty) days.    . cyclobenzaprine (FLEXERIL) 10 MG tablet Take 1 tablet (10 mg total) by mouth 3 (three) times daily as needed for muscle spasms. 20 tablet 0  . donepezil (ARICEPT) 5 MG tablet Take by mouth.    . dorzolamide-timolol (COSOPT) 22.3-6.8 MG/ML ophthalmic solution Place 1 drop into both eyes 2 (two) times daily.     Marland Kitchen esomeprazole (NEXIUM) 20 MG capsule Take 20 mg by mouth daily at 12 noon.    . ferrous sulfate 325 (65 FE) MG tablet Take 325 mg by mouth daily.    . furosemide (LASIX) 20 MG tablet Take 20 mg by mouth daily as needed for fluid. Try to limit to no more than 3-4 times per week.    . gabapentin (NEURONTIN) 100 MG capsule Take 1 capsule (100 mg total) by mouth 3 (three) times daily.     No current facility-administered medications for this visit.     Review of Systems  Constitutional: Negative for diaphoresis, fever, malaise/fatigue and weight loss (stable).  HENT: Negative.   Eyes: Negative.   Respiratory: Positive for shortness of breath (exertional). Negative for cough, hemoptysis and sputum production.   Cardiovascular: Negative for chest pain, palpitations, orthopnea, leg swelling and PND.  Gastrointestinal: Negative for abdominal pain, blood in stool, constipation, diarrhea, melena, nausea and vomiting.  Genitourinary: Negative for dysuria, frequency, hematuria and urgency.  Musculoskeletal: Positive for falls. Negative for back pain, joint pain and myalgias.       LEFT foot - 3rd digit amputation  Skin: Negative for itching and rash.  Neurological: Positive for sensory change (neuropathy in feet; stable) and weakness (generalized). Negative for dizziness, tremors and headaches.       Balance issues  Endo/Heme/Allergies: Does not bruise/bleed easily.   Psychiatric/Behavioral: Negative for depression, memory loss and suicidal ideas. The patient is not nervous/anxious and does not have insomnia.   All other systems reviewed and are negative.  Performance status (ECOG): 1 - 2  Vital Signs BP 126/83 (BP Location: Right Arm, Patient Position: Sitting)   Pulse 96   Temp 97.8 F (36.6 C) (Tympanic)   Resp 18   Wt 227 lb 8.2 oz (103.2 kg)   BMI 30.02 kg/m   Physical Exam  Constitutional: He is oriented to person, place, and time and well-developed, well-nourished, and in no distress.  HENT:  Head: Normocephalic and atraumatic.  Mouth/Throat: Oropharynx is clear and moist and mucous membranes are normal.  Eyes: Pupils are equal, round, and reactive to light. EOM are normal. No scleral icterus.  Neck: Normal range of motion. Neck supple. No tracheal deviation present. No thyromegaly present.  Cardiovascular: Normal rate, normal heart sounds and intact distal pulses. An irregularly irregular rhythm present.  Exam reveals no gallop and no friction rub.  No murmur heard. Pulmonary/Chest: Effort normal and breath sounds normal. No respiratory distress. He has no wheezes. He has no rales.  Abdominal: Soft. Bowel sounds are normal. He exhibits no distension. There is no tenderness.  Musculoskeletal: Normal range of motion. He exhibits edema (1+ to BLE). He exhibits no tenderness.       Left foot: There is deformity (Transmetatarsal amputation of 3rd digit).  Lymphadenopathy:    He has no cervical adenopathy.    He has no axillary adenopathy.       Right: No inguinal and no supraclavicular adenopathy present.       Left: No inguinal and no supraclavicular adenopathy present.  Neurological: He is alert and oriented to person, place, and time. He displays weakness (generalized). Gait (requires rollator walker) abnormal.  Vocal tic - sniffing and throat clearing.   Skin: Skin is warm and dry. Bruising (scattered to BUE) noted. No rash noted. No  erythema.  Psychiatric: Mood, affect and judgment normal.  Nursing note and vitals reviewed.   No visits with results within 3 Day(s) from this visit.  Latest known visit with results is:  Appointment on 10/31/2017  Component Date Value Ref Range Status  . Ferritin 10/31/2017 87  24 - 336 ng/mL Final   Performed at [facility], [address] [place], [place] [address]  . WBC 10/31/2017 5.4  3.8 - 10.6 K/uL Final  . RBC 10/31/2017 4.10* 4.40 - 5.90 MIL/uL Final  . Hemoglobin 10/31/2017 13.1  13.0 - 18.0 g/dL Final  . HCT 10/31/2017 38.0* 40.0 - 52.0 % Final  . MCV 10/31/2017 92.7  80.0 - 100.0 fL Final  . MCH 10/31/2017 32.0  26.0 - 34.0 pg Final  . MCHC 10/31/2017 34.5  32.0 - 36.0 g/dL Final  . RDW 10/31/2017 13.8  11.5 - 14.5 % Final  . Platelets 10/31/2017 120* 150 - 440 K/uL Final  . Neutrophils Relative % 10/31/2017 65  % Final  . Neutro Abs 10/31/2017 3.6  1.4 - 6.5 K/uL Final  . Lymphocytes Relative 10/31/2017 26  % Final  . Lymphs Abs 10/31/2017 1.4  1.0 - 3.6 K/uL Final  . Monocytes Relative 10/31/2017 6  % Final  . Monocytes Absolute 10/31/2017 0.3  0.2 - 1.0 K/uL Final  . Eosinophils Relative 10/31/2017 2  % Final  . Eosinophils Absolute 10/31/2017 0.1  0 - 0.7 K/uL Final  . Basophils Relative 10/31/2017 1  % Final  . Basophils Absolute 10/31/2017 0.0  0 - 0.1 K/uL Final   Performed at [facility], [address] [place], [place] [address]    Assessment:  MARTAVIS GURNEY is a 77 y.o. 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  He denies any new medications or herbal products. He has no known autoimmune disease. He has never required transfusion.   Work-up on 09/16/2014 revealed a hematocrit 36.4, hemoglobin 12.3, MCV 91.5, platelets 124,000 white count 4800 with an ANC of 3000. Differential was unremarkable. B12 was 262 (low normal) with an elevated  MMA consistent with B12 deficiency.  He began weekly 10/14/2014 (last 07/11/2017).  Folate was 18.1 on 08/30/2016 and 12.6 on 08/08/2017.  Normal labs included: folate, CMP, ANA, iron studies, hepatitis C antibody, hepatitis B surface antigen and core antibody, HIV testing, SPEP, free light chains, and PTT. TSH  was normal on 08/25/2014.  UPEP revealed no monoclonal protein.  He has a history of iron deficiency anemia.  EGD and colonoscopy 10 years ago were negative. He required esophageal dilatation. He has a history of reflux. Colonoscopy 3-4 years ago was unremarkable per the patient.  His diet is good.  He eats meat twice a week.  He is on oral iron.  Ferritin has been followed: 61 on 11/24/2015, 73 on 02/23/2016, 79 on 05/24/2016, 83 on 07/04/2016, 87 on 08/30/2016, 85 on 02/21/2017, 164 on 05/16/2017, 100 on 08/08/2017, 87 on 10/31/2017, and 110 on 01/23/2018.  He was diagnosed with a community acquired pneumonia on 05/20/2015.  He is completing a Z pack.  Chest CT angiogram on 05/20/2015 revealed a nodular focus of consolidation (indeterminate) for which neoplasm could not be completely excluded. Recommendation was for chest CT without contrast 1-2 months.   Chest CT on 07/05/2015 revealed resolved medial right lower lobe consolidation with residual mild ground-glass opacity.  There was mild patchy subpleural reticulation and ground-glass attenuation in both lungs, cannot exclude interstitial lung disease such as nonspecific interstitial pneumonia (NSIP).   High resolution chest CT on 07/04/2016 was compatible with interstitial lung disease. The overall pattern of disease was nonspecific, however, there was clear evidence of progression compared to the prior study 07/05/2015. Findings are worrisome for potential usual interstitial pneumonia (UIP), however, this may simply reflect chronic hypersensitivity pneumonitis or nonspecific interstitial pneumonia (NSIP). Accordingly, repeat  high-resolution chest CT is recommended in 12 months to assess for temporal changes in the appearance of the lung parenchyma.  Most pulmonary nodules are stable except a 7 x 5 mm nodule in the left lower lobe which has slightly enlarged. Attention at time of follow-up examination in 1 year was recommended.    High resolution chest CT on 07/04/2017 revealed a spectrum of findings suggestive of a mild nonprogressive fibrotic interstitial lung disease. 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  There were scattered solid pulmonary nodules stable since 07/04/2016 chest CT and considered benign. Ground-glass micronodularity visualized predominantly at the lung bases on the prior scan was largely resolved, compatible with resolved inflammatory nodularity.  He has a history of an elevated alkaline phosphatase (138-151) without trend since 09/2011.  Symptomatically, patient is doing well overall. He noted increased issues with his balance. He has been seen in consult by [facility], and subsequently referred for physical therapy. Recent LEFT lower extremity cellulitis has resolved.     Plan: 1. Labs today: CBC with diff, CMP, ferritin 2. Thrombocytopenia  Labs reviewed. Platelets stable at 133,000.  Denies increased bruising or bleeding.   No hepatosplenomegaly.  Continue routine lab monitoring.  3. Iron deficiency anemia  No anemia or microcytosis.   Ferritin level stable at 110 ng/mL.  Continues on oral ferrous sulfate 325 mg daily.  4. B12 deficiency  No macrocytosis.   Check B12 and folate with next routine lab draw.   Continues on monthly parenteral B12 supplementation.  5. Fibrotic interstitial lung disease  Stable. No recent exacerbations.   Followed by pulmonary medicine Raul Del, MD) - next visit is 01/30/2018. 6. Atrial fibrillation  Rate controlled  with no recent episodes of RVR.   No on anticoagulation due to concern for bleeding - patient refuses.   Followed by cardiology Nehemiah Massed, MD) - next visit 01/30/2018. 7. RTC in 3 months for labs (CBC with diff, ferritin, B12, folate), and B12 injection.  8. RTC in 6 months for MD assessment,  labs (CBC with diff, CMP, ferritin), and B12.   Honor Loh, NP  01/23/2018, 6:37 PM

## 2018-01-23 ENCOUNTER — Inpatient Hospital Stay: Payer: Medicare PPO

## 2018-01-23 ENCOUNTER — Inpatient Hospital Stay: Payer: Medicare PPO | Attending: Hematology and Oncology | Admitting: Urgent Care

## 2018-01-23 ENCOUNTER — Encounter: Payer: Self-pay | Admitting: Urgent Care

## 2018-01-23 VITALS — BP 126/83 | HR 96 | Temp 97.8°F | Resp 18 | Wt 227.5 lb

## 2018-01-23 DIAGNOSIS — I4891 Unspecified atrial fibrillation: Secondary | ICD-10-CM | POA: Insufficient documentation

## 2018-01-23 DIAGNOSIS — E538 Deficiency of other specified B group vitamins: Secondary | ICD-10-CM

## 2018-01-23 DIAGNOSIS — R296 Repeated falls: Secondary | ICD-10-CM | POA: Diagnosis not present

## 2018-01-23 DIAGNOSIS — Z89422 Acquired absence of other left toe(s): Secondary | ICD-10-CM

## 2018-01-23 DIAGNOSIS — D509 Iron deficiency anemia, unspecified: Secondary | ICD-10-CM

## 2018-01-23 DIAGNOSIS — G629 Polyneuropathy, unspecified: Secondary | ICD-10-CM | POA: Diagnosis not present

## 2018-01-23 DIAGNOSIS — R0609 Other forms of dyspnea: Secondary | ICD-10-CM

## 2018-01-23 DIAGNOSIS — J849 Interstitial pulmonary disease, unspecified: Secondary | ICD-10-CM | POA: Insufficient documentation

## 2018-01-23 DIAGNOSIS — D696 Thrombocytopenia, unspecified: Secondary | ICD-10-CM | POA: Diagnosis not present

## 2018-01-23 LAB — COMPREHENSIVE METABOLIC PANEL
ALT: 18 U/L (ref 0–44)
AST: 26 U/L (ref 15–41)
Albumin: 4.1 g/dL (ref 3.5–5.0)
Alkaline Phosphatase: 129 U/L — ABNORMAL HIGH (ref 38–126)
Anion gap: 8 (ref 5–15)
BUN: 29 mg/dL — ABNORMAL HIGH (ref 8–23)
CO2: 24 mmol/L (ref 22–32)
Calcium: 8.9 mg/dL (ref 8.9–10.3)
Chloride: 108 mmol/L (ref 98–111)
Creatinine, Ser: 1.17 mg/dL (ref 0.61–1.24)
GFR calc Af Amer: >60 mL/min (ref >60)
GFR calc non Af Amer: 59 mL/min — ABNORMAL LOW (ref >60)
Glucose, Bld: 121 mg/dL — ABNORMAL HIGH (ref 70–99)
Potassium: 4.1 mmol/L (ref 3.5–5.1)
Sodium: 140 mmol/L (ref 135–145)
Total Bilirubin: 0.6 mg/dL (ref 0.3–1.2)
Total Protein: 7.1 g/dL (ref 6.5–8.1)

## 2018-01-23 LAB — CBC WITH DIFFERENTIAL/PLATELET
Abs Immature Granulocytes: 0.01 10*3/uL (ref 0.00–0.07)
Basophils Absolute: 0.1 10*3/uL (ref 0.0–0.1)
Basophils Relative: 1 %
Eosinophils Absolute: 0.1 10*3/uL (ref 0.0–0.5)
Eosinophils Relative: 2 %
HCT: 40.2 % (ref 39.0–52.0)
Hemoglobin: 13.4 g/dL (ref 13.0–17.0)
Immature Granulocytes: 0 %
Lymphocytes Relative: 36 %
Lymphs Abs: 2.2 10*3/uL (ref 0.7–4.0)
MCH: 31.2 pg (ref 26.0–34.0)
MCHC: 33.3 g/dL (ref 30.0–36.0)
MCV: 93.5 fL (ref 80.0–100.0)
Monocytes Absolute: 0.5 10*3/uL (ref 0.1–1.0)
Monocytes Relative: 7 %
Neutro Abs: 3.3 10*3/uL (ref 1.7–7.7)
Neutrophils Relative %: 54 %
Platelets: 133 10*3/uL — ABNORMAL LOW (ref 150–400)
RBC: 4.3 MIL/uL (ref 4.22–5.81)
RDW: 13.2 % (ref 11.5–15.5)
WBC: 6.2 10*3/uL (ref 4.0–10.5)
nRBC: 0 % (ref 0.0–0.2)

## 2018-01-23 LAB — FERRITIN: Ferritin: 110 ng/mL (ref 24–336)

## 2018-01-23 MED ORDER — CYANOCOBALAMIN 1000 MCG/ML IJ SOLN
1000.0000 ug | Freq: Once | INTRAMUSCULAR | Status: AC
  Administered 2018-01-23: 1000 ug via INTRAMUSCULAR

## 2018-01-23 NOTE — Progress Notes (Signed)
Pt in for follow up, reports having multiple falls due to balance issues, using rollator now.  Pt just finished antibiotic on 01/07/18 for cellulitis to left leg.

## 2018-02-21 ENCOUNTER — Inpatient Hospital Stay: Payer: Medicare PPO

## 2018-02-26 ENCOUNTER — Inpatient Hospital Stay: Payer: Medicare PPO | Attending: Hematology and Oncology

## 2018-02-26 DIAGNOSIS — E538 Deficiency of other specified B group vitamins: Secondary | ICD-10-CM | POA: Diagnosis present

## 2018-02-26 MED ORDER — CYANOCOBALAMIN 1000 MCG/ML IJ SOLN
1000.0000 ug | Freq: Once | INTRAMUSCULAR | Status: AC
  Administered 2018-02-26: 1000 ug via INTRAMUSCULAR
  Filled 2018-02-26: qty 1

## 2018-03-21 ENCOUNTER — Inpatient Hospital Stay: Payer: Medicare PPO

## 2018-03-21 VITALS — BP 141/78 | HR 72 | Temp 97.0°F | Resp 18

## 2018-03-21 DIAGNOSIS — E538 Deficiency of other specified B group vitamins: Secondary | ICD-10-CM | POA: Diagnosis not present

## 2018-03-21 MED ORDER — CYANOCOBALAMIN 1000 MCG/ML IJ SOLN
1000.0000 ug | Freq: Once | INTRAMUSCULAR | Status: AC
  Administered 2018-03-21: 1000 ug via INTRAMUSCULAR
  Filled 2018-03-21: qty 1

## 2018-03-21 NOTE — Patient Instructions (Signed)
Cyanocobalamin, Vitamin B12 injection What is this medicine? CYANOCOBALAMIN (sye an oh koe BAL a min) is a man made form of vitamin B12. Vitamin B12 is used in the growth of healthy blood cells, nerve cells, and proteins in the body. It also helps with the metabolism of fats and carbohydrates. This medicine is used to treat people who can not absorb vitamin B12. This medicine may be used for other purposes; ask your health care provider or pharmacist if you have questions. COMMON BRAND NAME(S): B-12 Compliance Kit, B-12 Injection Kit, Cyomin, LA-12, Nutri-Twelve, Physicians EZ Use B-12, Primabalt What should I tell my health care provider before I take this medicine? They need to know if you have any of these conditions: -kidney disease -Leber's disease -megaloblastic anemia -an unusual or allergic reaction to cyanocobalamin, cobalt, other medicines, foods, dyes, or preservatives -pregnant or trying to get pregnant -breast-feeding How should I use this medicine? This medicine is injected into a muscle or deeply under the skin. It is usually given by a health care professional in a clinic or doctor's office. However, your doctor may teach you how to inject yourself. Follow all instructions. Talk to your pediatrician regarding the use of this medicine in children. Special care may be needed. Overdosage: If you think you have taken too much of this medicine contact a poison control center or emergency room at once. NOTE: This medicine is only for you. Do not share this medicine with others. What if I miss a dose? If you are given your dose at a clinic or doctor's office, call to reschedule your appointment. If you give your own injections and you miss a dose, take it as soon as you can. If it is almost time for your next dose, take only that dose. Do not take double or extra doses. What may interact with this medicine? -colchicine -heavy alcohol intake This list may not describe all possible  interactions. Give your health care provider a list of all the medicines, herbs, non-prescription drugs, or dietary supplements you use. Also tell them if you smoke, drink alcohol, or use illegal drugs. Some items may interact with your medicine. What should I watch for while using this medicine? Visit your doctor or health care professional regularly. You may need blood work done while you are taking this medicine. You may need to follow a special diet. Talk to your doctor. Limit your alcohol intake and avoid smoking to get the best benefit. What side effects may I notice from receiving this medicine? Side effects that you should report to your doctor or health care professional as soon as possible: -allergic reactions like skin rash, itching or hives, swelling of the face, lips, or tongue -blue tint to skin -chest tightness, pain -difficulty breathing, wheezing -dizziness -red, swollen painful area on the leg Side effects that usually do not require medical attention (report to your doctor or health care professional if they continue or are bothersome): -diarrhea -headache This list may not describe all possible side effects. Call your doctor for medical advice about side effects. You may report side effects to FDA at 1-800-FDA-1088. Where should I keep my medicine? Keep out of the reach of children. Store at room temperature between 15 and 30 degrees C (59 and 85 degrees F). Protect from light. Throw away any unused medicine after the expiration date. NOTE: This sheet is a summary. It may not cover all possible information. If you have questions about this medicine, talk to your doctor, pharmacist, or   health care provider.  2019 Elsevier/Gold Standard (2007-05-20 22:10:20)  

## 2018-03-31 ENCOUNTER — Other Ambulatory Visit: Payer: Self-pay

## 2018-03-31 ENCOUNTER — Emergency Department
Admission: EM | Admit: 2018-03-31 | Discharge: 2018-03-31 | Disposition: A | Discharge status: Home / Self Care | Payer: Medicare PPO | Attending: Emergency Medicine | Admitting: Emergency Medicine

## 2018-03-31 ENCOUNTER — Emergency Department: Payer: Medicare PPO

## 2018-03-31 DIAGNOSIS — R531 Weakness: Secondary | ICD-10-CM

## 2018-03-31 DIAGNOSIS — E785 Hyperlipidemia, unspecified: Secondary | ICD-10-CM | POA: Diagnosis not present

## 2018-03-31 DIAGNOSIS — I110 Hypertensive heart disease with heart failure: Secondary | ICD-10-CM | POA: Diagnosis not present

## 2018-03-31 DIAGNOSIS — W19XXXA Unspecified fall, initial encounter: Secondary | ICD-10-CM

## 2018-03-31 DIAGNOSIS — R0602 Shortness of breath: Secondary | ICD-10-CM | POA: Insufficient documentation

## 2018-03-31 DIAGNOSIS — I509 Heart failure, unspecified: Secondary | ICD-10-CM | POA: Insufficient documentation

## 2018-03-31 DIAGNOSIS — D649 Anemia, unspecified: Secondary | ICD-10-CM | POA: Insufficient documentation

## 2018-03-31 LAB — COMPREHENSIVE METABOLIC PANEL
ALT: 15 U/L (ref 0–44)
AST: 19 U/L (ref 15–41)
Albumin: 3.7 g/dL (ref 3.5–5.0)
Alkaline Phosphatase: 104 U/L (ref 38–126)
Anion gap: 6 (ref 5–15)
BUN: 18 mg/dL (ref 8–23)
CHLORIDE: 110 mmol/L (ref 98–111)
CO2: 25 mmol/L (ref 22–32)
Calcium: 8.6 mg/dL — ABNORMAL LOW (ref 8.9–10.3)
Creatinine, Ser: 0.94 mg/dL (ref 0.61–1.24)
GFR calc Af Amer: >60 mL/min (ref >60)
GFR calc non Af Amer: >60 mL/min (ref >60)
Glucose, Bld: 133 mg/dL — ABNORMAL HIGH (ref 70–99)
Potassium: 4 mmol/L (ref 3.5–5.1)
Sodium: 141 mmol/L (ref 135–145)
Total Bilirubin: 1.2 mg/dL (ref 0.3–1.2)
Total Protein: 6.7 g/dL (ref 6.5–8.1)

## 2018-03-31 LAB — URINALYSIS, COMPLETE (UACMP) WITH MICROSCOPIC
Bacteria, UA: NONE SEEN
Bilirubin Urine: NEGATIVE
Glucose, UA: NEGATIVE mg/dL
KETONES UR: NEGATIVE mg/dL
Nitrite: NEGATIVE
PROTEIN: NEGATIVE mg/dL
Specific Gravity, Urine: 1.02 (ref 1.005–1.030)
pH: 5 (ref 5.0–8.0)

## 2018-03-31 LAB — CBC WITH DIFFERENTIAL/PLATELET
Abs Immature Granulocytes: 0.02 10*3/uL (ref 0.00–0.07)
BASOS ABS: 0 10*3/uL (ref 0.0–0.1)
Basophils Relative: 1 %
Eosinophils Absolute: 0.2 10*3/uL (ref 0.0–0.5)
Eosinophils Relative: 3 %
HCT: 40.8 % (ref 39.0–52.0)
Hemoglobin: 13.4 g/dL (ref 13.0–17.0)
Immature Granulocytes: 0 %
LYMPHS PCT: 22 %
Lymphs Abs: 1.3 10*3/uL (ref 0.7–4.0)
MCH: 31.5 pg (ref 26.0–34.0)
MCHC: 32.8 g/dL (ref 30.0–36.0)
MCV: 95.8 fL (ref 80.0–100.0)
Monocytes Absolute: 0.5 10*3/uL (ref 0.1–1.0)
Monocytes Relative: 8 %
Neutro Abs: 3.9 10*3/uL (ref 1.7–7.7)
Neutrophils Relative %: 66 %
Platelets: 109 10*3/uL — ABNORMAL LOW (ref 150–400)
RBC: 4.26 MIL/uL (ref 4.22–5.81)
RDW: 13.7 % (ref 11.5–15.5)
WBC: 5.8 10*3/uL (ref 4.0–10.5)
nRBC: 0 % (ref 0.0–0.2)

## 2018-03-31 LAB — TROPONIN I: Troponin I: <0.03 ng/mL (ref <0.03)

## 2018-03-31 NOTE — ED Provider Notes (Signed)
[facility] Emergency Department Provider Note       Time seen: ----------------------------------------- 12:01 PM on 03/31/2018 -----------------------------------------   I have reviewed the triage vital signs and the nursing notes.  HISTORY   Chief Complaint Weakness    HPI Jeremy Herrera is a 78 y.o. male with a history of anemia, CHF, GERD, hyperlipidemia, hypertension, urinary tension who presents to the ED for weakness.  Family states he is more weak than normal and has recently had some shortness of breath.  Family states he fell today, he was using his walker and struck the right side of his chest with a walker.  He does not complain of any pains.  They deny any recent illness.  He has had persistent balance trouble and has been evaluated for this before.  Past Medical History:  Diagnosis Date  . Anemia   . BPH (benign prostatic hyperplasia)   . CHF (congestive heart failure) (HCC)   . Dysphagia   . GERD (gastroesophageal reflux disease)   . HLD (hyperlipidemia)   . HTN (hypertension)   . Irregular heart beat   . Nocturia   . Urinary retention     Patient Active Problem List   Diagnosis Date Noted  . 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HCC) 05/02/2017  . Lung nodule 03/11/2017  . Nausea 12/18/2016  . Diabetic foot ulcer (HCC) 11/13/2016  . Nocturia 10/17/2014  . History of urinary retention 10/17/2014  . 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  . B12 deficiency 09/30/2014  . Right leg swelling 09/30/2014  . Anemia 09/16/2014  . Thrombocytopenia (HCC) 09/16/2014  . Benign essential HTN 06/08/2014  . 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 Beat, premature ventricular 12/03/2013  . Heart valve disease 12/03/2013  . 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 Diabetes mellitus, type 2 (HCC) 11/20/2013  . Acid reflux 11/20/2013  . Anemia, iron deficiency 11/20/2013  . Abnormal presence of protein in urine  11/20/2013  . 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 AMPUTATION TOE Left 05/04/2017   Procedure: AMPUTATION TOE - third left toe;  Surgeon: Gwyneth Revels, DPM;  Location: [facility] ORS;  Service: Podiatry;  Laterality: Left;  . bladder patching  1964  . HIP SURGERY Right 2014  . IRRIGATION AND DEBRIDEMENT FOOT Left 11/14/2016   Procedure: IRRIGATION AND DEBRIDEMENT FOOT;  Surgeon: Gwyneth Revels, DPM;  Location: [facility] ORS;  Service: Podiatry;  Laterality: Left;  . TRANSURETHRAL RESECTION OF PROSTATE      Allergies Hydrocodone-acetaminophen  Social History Social History   Tobacco Use  . Smoking status: Never Smoker  . Smokeless tobacco: Never Used  Substance Use Topics  . Alcohol use: No  . Drug use: No   Review of Systems Constitutional: Negative for fever. Cardiovascular: Negative for chest pain. Respiratory: Negative for shortness of breath. Gastrointestinal: Negative for abdominal pain, vomiting and diarrhea. Musculoskeletal: Negative for back pain. Skin: Negative for rash. Neurological: Positive for ataxia  All systems negative/normal/unremarkable except as stated in the HPI  ____________________________________________   PHYSICAL EXAM:  VITAL SIGNS: ED Triage Vitals  Enc Vitals Group     BP      Pulse      Resp      Temp      Temp src      SpO2      Weight      Height      Head Circumference  Peak Flow      Pain Score      Pain Loc      Pain Edu?      Excl. in GC?    Constitutional: Alert and oriented.  Chronically ill-appearing, no distress Eyes: Conjunctivae are normal. Normal extraocular movements. ENT      Head: Normocephalic and atraumatic.      Nose: No congestion/rhinnorhea.      Mouth/Throat: Mucous membranes are moist.      Neck: No stridor. Cardiovascular: Irregularly irregular rhythm, rapid rate no murmurs, rubs, or gallops. Respiratory: Normal  respiratory effort without tachypnea nor retractions. Breath sounds are clear and equal bilaterally. No wheezes/rales/rhonchi. Gastrointestinal: Soft and nontender. Normal bowel sounds Musculoskeletal: Nontender with normal range of motion in extremities.  Bilateral edema is noted, previous left foot surgery Neurologic:  Normal speech and language. No gross focal neurologic deficits are appreciated.  Skin:  Skin is warm, dry and intact. No rash noted. Psychiatric: Mood and affect are normal. Speech and behavior are normal.  ____________________________________________  EKG: Interpreted by me.  Atrial fibrillation with a rate of 123 bpm, left bundle branch block, normal axis, long QT  ____________________________________________  ED COURSE:  As part of my medical decision making, I reviewed the following data within the electronic MEDICAL RECORD NUMBER History obtained from family if available, nursing notes, old chart and ekg, as well as notes from prior ED visits. Patient presented for weakness with recent fall, we will assess with labs and imaging as indicated at this time.   Procedures ____________________________________________   LABS (pertinent positives/negatives)  Labs Reviewed  CBC WITH DIFFERENTIAL/PLATELET - Abnormal; Notable for the following components:      Result Value   Platelets 109 (*)    All other components within normal limits  COMPREHENSIVE METABOLIC PANEL - Abnormal; Notable for the following components:   Glucose, Bld 133 (*)    Calcium 8.6 (*)    All other components within normal limits  URINALYSIS, COMPLETE (UACMP) WITH MICROSCOPIC - Abnormal; Notable for the following components:   Color, Urine YELLOW (*)    APPearance CLEAR (*)    Hgb urine dipstick MODERATE (*)    Leukocytes, UA TRACE (*)    All other components within normal limits  TROPONIN I  CBG MONITORING, ED    RADIOLOGY Images were viewed by me  CT head, chest x-ray, pelvis  x-ray IMPRESSION: 1. No acute intracranial abnormality. Stable non contrast CT appearance of the brain. 2. Right mastoid effusion is new since 2019, probably Postinflammatory. IMPRESSION: 1. No acute fracture or dislocation identified about the pelvis. 2. Right hip hardware intact. IMPRESSION: 1. No acute right rib fracture identified. 2. Mild chronic interstitial lung disease. No acute cardiopulmonary abnormality.  ____________________________________________   DIFFERENTIAL DIAGNOSIS   Fall, contusion, fracture, dehydration, electrolyte abnormality, general debility, CVA  FINAL ASSESSMENT AND PLAN  Fall, weakness   Plan: The patient had presented for fall with recent weakness and balance disturbance. Patient's labs and imaging did not reveal any acute process.  Patient will go home under the care of his family.  He is cleared for outpatient follow-up.   Ulice Dash, MD    Note: This note was generated in part or whole with voice recognition software. Voice recognition is usually quite accurate but there are transcription errors that can and very often do occur. I apologize for any typographical errors that were not detected and corrected.     Emily Filbert, MD 03/31/18 [phone]

## 2018-03-31 NOTE — ED Triage Notes (Signed)
Pt arrived via [facility] from home alone with more weakness than normal and SOB. Hx of AFIB and CHF. CBG 196, 97.3 no chest pain. Circulation issues in hand that is known. Pt in no distress at arrival.

## 2018-04-18 ENCOUNTER — Inpatient Hospital Stay: Payer: Medicare PPO | Attending: Hematology and Oncology

## 2018-04-18 DIAGNOSIS — E538 Deficiency of other specified B group vitamins: Secondary | ICD-10-CM | POA: Diagnosis present

## 2018-04-18 DIAGNOSIS — D696 Thrombocytopenia, unspecified: Secondary | ICD-10-CM | POA: Diagnosis not present

## 2018-04-18 DIAGNOSIS — D509 Iron deficiency anemia, unspecified: Secondary | ICD-10-CM | POA: Insufficient documentation

## 2018-04-18 LAB — CBC WITH DIFFERENTIAL/PLATELET
Abs Immature Granulocytes: 0.01 10*3/uL (ref 0.00–0.07)
Basophils Absolute: 0 10*3/uL (ref 0.0–0.1)
Basophils Relative: 1 %
Eosinophils Absolute: 0.2 10*3/uL (ref 0.0–0.5)
Eosinophils Relative: 3 %
HCT: 40.8 % (ref 39.0–52.0)
Hemoglobin: 13.9 g/dL (ref 13.0–17.0)
IMMATURE GRANULOCYTES: 0 %
LYMPHS PCT: 27 %
Lymphs Abs: 1.5 10*3/uL (ref 0.7–4.0)
MCH: 32.1 pg (ref 26.0–34.0)
MCHC: 34.1 g/dL (ref 30.0–36.0)
MCV: 94.2 fL (ref 80.0–100.0)
Monocytes Absolute: 0.3 10*3/uL (ref 0.1–1.0)
Monocytes Relative: 6 %
Neutro Abs: 3.4 10*3/uL (ref 1.7–7.7)
Neutrophils Relative %: 63 %
Platelets: 115 10*3/uL — ABNORMAL LOW (ref 150–400)
RBC: 4.33 MIL/uL (ref 4.22–5.81)
RDW: 13.6 % (ref 11.5–15.5)
WBC: 5.4 10*3/uL (ref 4.0–10.5)
nRBC: 0 % (ref 0.0–0.2)

## 2018-04-18 LAB — FERRITIN: Ferritin: 116 ng/mL (ref 24–336)

## 2018-04-18 LAB — FOLATE: Folate: 16.6 ng/mL (ref >5.9)

## 2018-04-18 LAB — VITAMIN B12: Vitamin B-12: 460 pg/mL (ref 180–914)

## 2018-04-18 MED ORDER — CYANOCOBALAMIN 1000 MCG/ML IJ SOLN
INTRAMUSCULAR | Status: AC
  Filled 2018-04-18: qty 1

## 2018-04-18 MED ORDER — CYANOCOBALAMIN 1000 MCG/ML IJ SOLN
1000.0000 ug | Freq: Once | INTRAMUSCULAR | Status: AC
  Administered 2018-04-18: 1000 ug via INTRAMUSCULAR

## 2018-04-18 NOTE — Patient Instructions (Signed)
Cyanocobalamin, Vitamin B12 injection What is this medicine? CYANOCOBALAMIN (sye an oh koe BAL a min) is a man made form of vitamin B12. Vitamin B12 is used in the growth of healthy blood cells, nerve cells, and proteins in the body. It also helps with the metabolism of fats and carbohydrates. This medicine is used to treat people who can not absorb vitamin B12. This medicine may be used for other purposes; ask your health care provider or pharmacist if you have questions. COMMON BRAND NAME(S): B-12 Compliance Kit, B-12 Injection Kit, Cyomin, LA-12, Nutri-Twelve, Physicians EZ Use B-12, Primabalt What should I tell my health care provider before I take this medicine? They need to know if you have any of these conditions: -kidney disease -Leber's disease -megaloblastic anemia -an unusual or allergic reaction to cyanocobalamin, cobalt, other medicines, foods, dyes, or preservatives -pregnant or trying to get pregnant -breast-feeding How should I use this medicine? This medicine is injected into a muscle or deeply under the skin. It is usually given by a health care professional in a clinic or doctor's office. However, your doctor may teach you how to inject yourself. Follow all instructions. Talk to your pediatrician regarding the use of this medicine in children. Special care may be needed. Overdosage: If you think you have taken too much of this medicine contact a poison control center or emergency room at once. NOTE: This medicine is only for you. Do not share this medicine with others. What if I miss a dose? If you are given your dose at a clinic or doctor's office, call to reschedule your appointment. If you give your own injections and you miss a dose, take it as soon as you can. If it is almost time for your next dose, take only that dose. Do not take double or extra doses. What may interact with this medicine? -colchicine -heavy alcohol intake This list may not describe all possible  interactions. Give your health care provider a list of all the medicines, herbs, non-prescription drugs, or dietary supplements you use. Also tell them if you smoke, drink alcohol, or use illegal drugs. Some items may interact with your medicine. What should I watch for while using this medicine? Visit your doctor or health care professional regularly. You may need blood work done while you are taking this medicine. You may need to follow a special diet. Talk to your doctor. Limit your alcohol intake and avoid smoking to get the best benefit. What side effects may I notice from receiving this medicine? Side effects that you should report to your doctor or health care professional as soon as possible: -allergic reactions like skin rash, itching or hives, swelling of the face, lips, or tongue -blue tint to skin -chest tightness, pain -difficulty breathing, wheezing -dizziness -red, swollen painful area on the leg Side effects that usually do not require medical attention (report to your doctor or health care professional if they continue or are bothersome): -diarrhea -headache This list may not describe all possible side effects. Call your doctor for medical advice about side effects. You may report side effects to FDA at 1-800-FDA-1088. Where should I keep my medicine? Keep out of the reach of children. Store at room temperature between 15 and 30 degrees C (59 and 85 degrees F). Protect from light. Throw away any unused medicine after the expiration date. NOTE: This sheet is a summary. It may not cover all possible information. If you have questions about this medicine, talk to your doctor, pharmacist, or   health care provider.  2019 Elsevier/Gold Standard (2007-05-20 22:10:20)  

## 2018-05-15 ENCOUNTER — Other Ambulatory Visit: Payer: Self-pay

## 2018-05-16 ENCOUNTER — Other Ambulatory Visit: Payer: Self-pay

## 2018-05-16 ENCOUNTER — Inpatient Hospital Stay: Payer: Medicare PPO | Attending: Hematology and Oncology

## 2018-05-16 VITALS — BP 119/79 | HR 79 | Temp 95.5°F | Resp 18

## 2018-05-16 DIAGNOSIS — E538 Deficiency of other specified B group vitamins: Secondary | ICD-10-CM

## 2018-05-16 MED ORDER — CYANOCOBALAMIN 1000 MCG/ML IJ SOLN
1000.0000 ug | Freq: Once | INTRAMUSCULAR | Status: AC
  Administered 2018-05-16: 1000 ug via INTRAMUSCULAR

## 2018-05-16 NOTE — Patient Instructions (Signed)
Cyanocobalamin, Vitamin B12 injection What is this medicine? CYANOCOBALAMIN (sye an oh koe BAL a min) is a man made form of vitamin B12. Vitamin B12 is used in the growth of healthy blood cells, nerve cells, and proteins in the body. It also helps with the metabolism of fats and carbohydrates. This medicine is used to treat people who can not absorb vitamin B12. This medicine may be used for other purposes; ask your health care provider or pharmacist if you have questions. COMMON BRAND NAME(S): B-12 Compliance Kit, B-12 Injection Kit, Cyomin, LA-12, Nutri-Twelve, Physicians EZ Use B-12, Primabalt What should I tell my health care provider before I take this medicine? They need to know if you have any of these conditions: -kidney disease -Leber's disease -megaloblastic anemia -an unusual or allergic reaction to cyanocobalamin, cobalt, other medicines, foods, dyes, or preservatives -pregnant or trying to get pregnant -breast-feeding How should I use this medicine? This medicine is injected into a muscle or deeply under the skin. It is usually given by a health care professional in a clinic or doctor's office. However, your doctor may teach you how to inject yourself. Follow all instructions. Talk to your pediatrician regarding the use of this medicine in children. Special care may be needed. Overdosage: If you think you have taken too much of this medicine contact a poison control center or emergency room at once. NOTE: This medicine is only for you. Do not share this medicine with others. What if I miss a dose? If you are given your dose at a clinic or doctor's office, call to reschedule your appointment. If you give your own injections and you miss a dose, take it as soon as you can. If it is almost time for your next dose, take only that dose. Do not take double or extra doses. What may interact with this medicine? -colchicine -heavy alcohol intake This list may not describe all possible  interactions. Give your health care provider a list of all the medicines, herbs, non-prescription drugs, or dietary supplements you use. Also tell them if you smoke, drink alcohol, or use illegal drugs. Some items may interact with your medicine. What should I watch for while using this medicine? Visit your doctor or health care professional regularly. You may need blood work done while you are taking this medicine. You may need to follow a special diet. Talk to your doctor. Limit your alcohol intake and avoid smoking to get the best benefit. What side effects may I notice from receiving this medicine? Side effects that you should report to your doctor or health care professional as soon as possible: -allergic reactions like skin rash, itching or hives, swelling of the face, lips, or tongue -blue tint to skin -chest tightness, pain -difficulty breathing, wheezing -dizziness -red, swollen painful area on the leg Side effects that usually do not require medical attention (report to your doctor or health care professional if they continue or are bothersome): -diarrhea -headache This list may not describe all possible side effects. Call your doctor for medical advice about side effects. You may report side effects to FDA at 1-800-FDA-1088. Where should I keep my medicine? Keep out of the reach of children. Store at room temperature between 15 and 30 degrees C (59 and 85 degrees F). Protect from light. Throw away any unused medicine after the expiration date. NOTE: This sheet is a summary. It may not cover all possible information. If you have questions about this medicine, talk to your doctor, pharmacist, or   health care provider.  2019 Elsevier/Gold Standard (2007-05-20 22:10:20)  

## 2018-06-12 DIAGNOSIS — R0603 Acute respiratory distress: Secondary | ICD-10-CM | POA: Insufficient documentation

## 2018-06-12 DIAGNOSIS — R0600 Dyspnea, unspecified: Secondary | ICD-10-CM | POA: Insufficient documentation

## 2018-06-13 ENCOUNTER — Inpatient Hospital Stay: Payer: Medicare PPO

## 2018-06-18 DIAGNOSIS — E441 Mild protein-calorie malnutrition: Secondary | ICD-10-CM | POA: Insufficient documentation

## 2018-07-11 ENCOUNTER — Ambulatory Visit: Payer: Medicare PPO

## 2018-07-11 ENCOUNTER — Ambulatory Visit: Payer: Medicare PPO | Admitting: Hematology and Oncology

## 2018-07-11 ENCOUNTER — Other Ambulatory Visit: Payer: Medicare PPO

## 2018-07-25 ENCOUNTER — Ambulatory Visit: Payer: Medicare PPO | Admitting: Urology

## 2018-07-29 ENCOUNTER — Ambulatory Visit: Payer: Medicare PPO | Admitting: Urology

## 2018-08-07 DIAGNOSIS — I2510 Atherosclerotic heart disease of native coronary artery without angina pectoris: Secondary | ICD-10-CM | POA: Insufficient documentation

## 2018-08-07 DIAGNOSIS — L539 Erythematous condition, unspecified: Secondary | ICD-10-CM | POA: Insufficient documentation

## 2018-08-15 ENCOUNTER — Other Ambulatory Visit: Payer: Medicare PPO

## 2018-08-15 ENCOUNTER — Ambulatory Visit: Payer: Medicare PPO | Admitting: Hematology and Oncology

## 2018-08-15 ENCOUNTER — Ambulatory Visit: Payer: Medicare PPO

## 2018-08-16 ENCOUNTER — Other Ambulatory Visit: Payer: Medicare PPO

## 2018-08-16 ENCOUNTER — Ambulatory Visit: Payer: Medicare PPO

## 2018-08-16 ENCOUNTER — Ambulatory Visit: Payer: Medicare PPO | Admitting: Hematology and Oncology

## 2018-08-29 ENCOUNTER — Telehealth: Payer: Medicare PPO | Admitting: Urology

## 2018-08-30 ENCOUNTER — Ambulatory Visit: Payer: Medicare PPO

## 2018-08-30 ENCOUNTER — Other Ambulatory Visit: Payer: Medicare PPO

## 2018-08-30 ENCOUNTER — Ambulatory Visit: Payer: Medicare PPO | Admitting: Hematology and Oncology

## 2018-09-16 NOTE — Progress Notes (Signed)
[facility]  [address], [address] [place], [place] [address] Phone: [phone]  Fax: [phone]   Clinic Day:  09/18/2018  Referring physician: Sofie Hartigan, MD  Chief Complaint: Jeremy Herrera is a 78 y.o. male with B12 deficiency, iron deficiency, and mild thrombocytopenia (ITP) who is seen for 7 month assessment.  HPI: The patient was last seen in the hematology clinic on 01/23/2018. At that time, patient was doing well overall. He noted increased issues with his balance. He had been seen in consult by [facility], and subsequently referred for physical therapy.  Left lower extremity cellulitis had resolved.  He was seen in the [facility] ED on 03/31/2018 for weakness and shortness of breath. EKG showed atrial fibrillation. CT head, CXR, and pelvic XR were negative.   He received B-12 on 04/18/2018 and 05/16/2018.  Labs on 04/18/2018: hematocrit 40.8, hemoglobin 13.9, MCV 94.2, platelets 115,000, WBC 5,400. Ferritin 116. B-12 460.  Folate 16.6.  During the interim, he is "doing pretty good." He reports fatigue. He denies any bruising or bleeding more than normal. He denies any blood in his stools or black stools. He denies any chest pain, and notes shortness of breath with exertion. His neuropathy in bilateral feet is stable and he continues to have balance issues. He denies any dizziness or lightheadedness.   He was on Eliquis, but recently taken off due to fear of significant bleeding following a fall. His takes a daily baby aspirin. He reports several recent hospitalizations. He is followed in cardiology, most recently seen by Dr. Nehemiah Massed on 09/17/2018. He has a watchmen left atrial appendage closure device surgery planned for 09/25/2018.   Past Medical History:  Diagnosis Date   Anemia    BPH (benign prostatic hyperplasia)    CHF (congestive heart failure) (HCC)    Dysphagia    GERD (gastroesophageal reflux disease)    HLD (hyperlipidemia)     HTN (hypertension)    Irregular heart beat    Nocturia    Urinary retention     Past Surgical History:  Procedure Laterality Date   AMPUTATION TOE Left 05/04/2017   Procedure: AMPUTATION TOE - third left toe;  Surgeon: Samara Deist, DPM;  Location: [facility] ORS;  Service: Podiatry;  Laterality: Left;   bladder patching  1964   HIP SURGERY Right 2014   IRRIGATION AND DEBRIDEMENT FOOT Left 11/14/2016   Procedure: IRRIGATION AND DEBRIDEMENT FOOT;  Surgeon: Samara Deist, DPM;  Location: [facility] ORS;  Service: Podiatry;  Laterality: Left;   TRANSURETHRAL RESECTION OF PROSTATE      Family History  Problem Relation Age of Onset   Ulcers Mother        stomach   GER disease Sister        GERD   Heart attack Father    Diabetes Brother    Depression Brother    Kidney disease Neg Hx    Prostate cancer Neg Hx    Kidney cancer Neg Hx    Bladder Cancer Neg Hx     Social History:  reports that he has never smoked. He has never used smokeless tobacco. He reports that he does not drink alcohol or use drugs.He lives in [place] by himself. He has been staying with his daughter the past month following two hospitalizations. He is alone today.  Allergies:  Allergies  Allergen Reactions   Hydrocodone-Acetaminophen Nausea And Vomiting    Current Medications: Current Outpatient Medications  Medication Sig Dispense Refill   acetaminophen (TYLENOL)  500 MG tablet Take 500 mg by mouth every 6 (six) hours as needed.      albuterol (VENTOLIN HFA) 108 (90 Base) MCG/ACT inhaler Inhale 1-2 puffs into the lungs every 4 (four) hours as needed.      aspirin 81 MG tablet Take 81 mg by mouth daily.     atorvastatin (LIPITOR) 40 MG tablet Take 40 mg by mouth daily at 6 PM.      bimatoprost (LUMIGAN) 0.01 % SOLN 1 drop nightly     Cyanocobalamin (B-12) 1000 MCG/ML KIT Inject 1 mL as directed every 30 (thirty) days.     cyclobenzaprine (FLEXERIL) 10 MG tablet Take 1 tablet (10 mg total) by  mouth 3 (three) times daily as needed for muscle spasms. 20 tablet 0   donepezil (ARICEPT) 5 MG tablet Take 5 mg by mouth at bedtime.      dorzolamide-timolol (COSOPT) 22.3-6.8 MG/ML ophthalmic solution Place 1 drop into both eyes 2 (two) times daily.      EMOLLIENT EX Apply topically daily.      esomeprazole (NEXIUM) 20 MG capsule Take 20 mg by mouth daily at 12 noon.     ferrous sulfate 325 (65 FE) MG tablet Take 325 mg by mouth daily.     Fluticasone-Salmeterol (ADVAIR) 250-50 MCG/DOSE AEPB Inhale 1 puff into the lungs as needed.      furosemide (LASIX) 20 MG tablet Take 20 mg by mouth daily.      ketoconazole (NIZORAL) 2 % cream Apply 1 application topically daily as needed.      lisinopril (ZESTRIL) 2.5 MG tablet Take 2.5 mg by mouth daily.      potassium chloride (K-DUR) 10 MEQ tablet Take 10 mEq by mouth daily.      simethicone (MYLICON) 599 MG chewable tablet Chew 125 mg by mouth every 6 (six) hours as needed.      umeclidinium-vilanterol (ANORO ELLIPTA) 62.5-25 MCG/INH AEPB Inhale 1 puff into the lungs daily as needed.      metoprolol succinate (TOPROL-XL) 50 MG 24 hr tablet Take 50 mg by mouth daily.      nitroGLYCERIN (NITROSTAT) 0.4 MG SL tablet Place 0.4 mg under the tongue every 5 (five) minutes as needed.      sertraline (ZOLOFT) 25 MG tablet Take 25 mg by mouth daily.      No current facility-administered medications for this visit.     Review of Systems  Constitutional: Positive for malaise/fatigue and weight loss (12 lbs). Negative for diaphoresis and fever.       "Doing pretty good."  HENT: Negative.  Negative for congestion, hearing loss, sinus pain and sore throat.   Eyes: Negative.  Negative for blurred vision.  Respiratory: Positive for shortness of breath (exertional). Negative for cough, hemoptysis and sputum production.   Cardiovascular: Negative.  Negative for chest pain, palpitations, orthopnea, leg swelling and PND.  Gastrointestinal: Negative.   Negative for abdominal pain, blood in stool, constipation, diarrhea, melena, nausea and vomiting.  Genitourinary: Negative.  Negative for dysuria, frequency, hematuria and urgency.  Musculoskeletal: Positive for falls. Negative for back pain, joint pain and myalgias.       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  Negative for itching and rash.  Neurological: Positive for sensory change (neuropathy in feet; stable) and weakness (generalized). Negative for dizziness, tremors and headaches.       Balance issues  Endo/Heme/Allergies: Negative.  Does not bruise/bleed easily.  Psychiatric/Behavioral: Negative for depression, memory loss and suicidal  ideas. The patient is not nervous/anxious and does not have insomnia.   All other systems reviewed and are negative.  Performance status (ECOG): 2  Vitals Blood pressure 119/89, pulse 85, temperature 97.7 F (36.5 C), temperature source Oral, resp. rate 18, weight 215 lb 4.8 oz (97.7 kg), SpO2 100 %.   Physical Exam  Constitutional: He is oriented to person, place, and time. He appears well-developed and well-nourished.  He has a rolling walker at his side.  HENT:  Head: Normocephalic and atraumatic.  Mouth/Throat: No oropharyngeal exudate.  Gray hair. 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 Mask. Dry mouth.  Eyes: Pupils are equal, round, and reactive to light. Conjunctivae and EOM are normal. No scleral icterus.  Glasses.   Neck: Normal range of motion. Neck supple.  Cardiovascular: Normal rate, regular rhythm and normal heart sounds.  No murmur heard. Pulmonary/Chest: Effort normal and breath sounds normal. No respiratory distress. He has no wheezes.  Abdominal: Soft. Bowel sounds are normal. He exhibits no distension. There is no abdominal tenderness.  Musculoskeletal: Normal range of motion.        General: Tenderness (BLE) and edema (1+ to BLE) present.  Lymphadenopathy:    He has no cervical adenopathy.    He has no axillary adenopathy.        Right: No supraclavicular adenopathy present.       Left: No supraclavicular adenopathy present.  Neurological: He is alert and oriented to person, place, and time. Gait (requires walker) abnormal.  Skin: Skin is warm and dry. Bruising (scattered to BUE) noted. He is not diaphoretic. No erythema.  LLE blisters healed.  Psychiatric: He has a normal mood and affect. His behavior is normal. Judgment and thought content normal.  Nursing note and vitals reviewed.   Appointment on 09/18/2018  Component Date Value Ref Range Status   WBC 09/18/2018 6.3  4.0 - 10.5 K/uL Final   RBC 09/18/2018 4.48  4.22 - 5.81 MIL/uL Final   Hemoglobin 09/18/2018 14.0  13.0 - 17.0 g/dL Final   HCT 09/18/2018 42.2  39.0 - 52.0 % Final   MCV 09/18/2018 94.2  80.0 - 100.0 fL Final   MCH 09/18/2018 31.3  26.0 - 34.0 pg Final   MCHC 09/18/2018 33.2  30.0 - 36.0 g/dL Final   RDW 09/18/2018 13.4  11.5 - 15.5 % Final   Platelets 09/18/2018 114* 150 - 400 K/uL Final   Comment: Immature Platelet Fraction may be clinically indicated, consider ordering this additional test [id number]    nRBC 09/18/2018 0.0  0.0 - 0.2 % Final   Performed at [facility] [address], [place], [place] [address]   Neutrophils Relative % 09/18/2018 PENDING  % Incomplete   Neutro Abs 09/18/2018 PENDING  1.7 - 7.7 K/uL Incomplete   Band Neutrophils 09/18/2018 PENDING  % Incomplete   Lymphocytes Relative 09/18/2018 PENDING  % Incomplete   Lymphs Abs 09/18/2018 PENDING  0.7 - 4.0 K/uL Incomplete   Monocytes Relative 09/18/2018 PENDING  % Incomplete   Monocytes Absolute 09/18/2018 PENDING  0.1 - 1.0 K/uL Incomplete   Eosinophils Relative 09/18/2018 PENDING  % Incomplete   Eosinophils Absolute 09/18/2018 PENDING  0.0 - 0.5 K/uL Incomplete   Basophils Relative 09/18/2018 PENDING  % Incomplete   Basophils Absolute 09/18/2018 PENDING  0.0 - 0.1 K/uL Incomplete   WBC Morphology 09/18/2018 PENDING   Incomplete     RBC Morphology 09/18/2018 PENDING   Incomplete   Smear Review 09/18/2018 PENDING   Incomplete   Other 09/18/2018  PENDING  % Incomplete   nRBC 09/18/2018 PENDING  0 /100 WBC Incomplete   Metamyelocytes Relative 09/18/2018 PENDING  % Incomplete   Myelocytes 09/18/2018 PENDING  % Incomplete   Promyelocytes Relative 09/18/2018 PENDING  % Incomplete   Blasts 09/18/2018 PENDING  % Incomplete    Assessment:  Jeremy Herrera is a 78 y.o. 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  He denies any new medications or herbal products. He has no known autoimmune disease. He has never required transfusion.   Work-up on 09/16/2014 revealed a hematocrit 36.4, hemoglobin 12.3, MCV 91.5, platelets 124,000 white count 4800 with an ANC of 3000. Differential was unremarkable. B12 was 262 (low normal) with an elevated MMA consistent with B12 deficiency.  He began weekly 10/14/2014 (last 07/11/2017).  Folate was 18.1 on 08/30/2016 and 12.6 on 08/08/2017.  Normal labs included: folate, CMP, ANA, iron studies, hepatitis C antibody, hepatitis B surface antigen and core antibody, HIV testing, SPEP, free light chains, and PTT. TSH  was normal on 08/25/2014.  UPEP revealed no monoclonal protein.  He has a history of iron deficiency anemia.  EGD and colonoscopy 10 years ago were negative. He required esophageal dilatation. He has a history of reflux. Colonoscopy 3-4 years ago was unremarkable per the patient.  His diet is good.  He eats meat twice a week.  He is on oral iron.  Ferritin has been followed: 61 on 11/24/2015, 73 on 02/23/2016, 79 on 05/24/2016, 83 on 07/04/2016, 87 on 08/30/2016, 85 on 02/21/2017, 164 on 05/16/2017, 100 on 08/08/2017, 87 on 10/31/2017, and 110 on 01/23/2018.  He was diagnosed with a community acquired pneumonia on 05/20/2015.  He is completing a Z pack.  Chest CT angiogram on 05/20/2015  revealed a nodular focus of consolidation (indeterminate) for which neoplasm could not be completely excluded. Recommendation was for chest CT without contrast 1-2 months.   Chest CT on 07/05/2015 revealed resolved medial right lower lobe consolidation with residual mild ground-glass opacity.  There was mild patchy subpleural reticulation and ground-glass attenuation in both lungs, cannot exclude interstitial lung disease such as nonspecific interstitial pneumonia (NSIP).   High resolution chest CT on 07/04/2016 was compatible with interstitial lung disease. The overall pattern of disease was nonspecific, however, there was clear evidence of progression compared to the prior study 07/05/2015. Findings are worrisome for potential usual interstitial pneumonia (UIP), however, this may simply reflect chronic hypersensitivity pneumonitis or nonspecific interstitial pneumonia (NSIP). Accordingly, repeat high-resolution chest CT is recommended in 12 months to assess for temporal changes in the appearance of the lung parenchyma.  Most pulmonary nodules are stable except a 7 x 5 mm nodule in the left lower lobe which has slightly enlarged. Attention at time of follow-up examination in 1 year was recommended.    High resolution chest CT on 07/04/2017 revealed a spectrum of findings suggestive of a mild nonprogressive fibrotic interstitial lung disease. 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  There were scattered solid pulmonary nodules stable since 07/04/2016 chest CT and considered benign. Ground-glass micronodularity visualized predominantly at the lung bases on the prior scan was largely resolved, compatible with resolved inflammatory nodularity.  He has a history of an elevated alkaline phosphatase (138-151) without trend since 09/2011.  Symptomatically, he is doing "pretty  good",  Exam reveals no adenopathy or hepatosplenomegaly.  Plan: 1.  Labs today: CBC with diff, CMP, ferritin. 2.   Thrombocytopenia Labs reviewed. Platelets stable at 114,000. He has had no increased bruising or bleeding. Continue to monitor. 3.   Iron deficiency anemia, resolved Hematocrit 42.2.  Hemoglobin 12.0. Ferritin 111.  Continue to monitor. 4.   B12 deficiency B12 was 460 and folate 16.6 on 04/18/2018. B12 today and monthly x 6. 5.   Atrial fibrillation Patient off Eliquis secondary to recent falls. He is currently on a baby aspirin. He is scheduled for Watchmen left atrial appendage closure device surgery on 09/25/2018. 6.   RTC in 3 months for labs (CBC with diff, ferritin) and B12 injection. 7.   RTC in 6 months for MD assessment, labs (CBC with diff, CMP, ferritin, folate), and B12.  I discussed the assessment and treatment plan with the patient.  The patient was provided an opportunity to ask questions and all were answered.  The patient agreed with the plan and demonstrated an understanding of the instructions.  The patient was advised to call back if the symptoms worsen or if the condition fails to improve as anticipated.  I provided 20 minutes of face-to-face time during this this encounter and > 50% was spent counseling as documented under my assessment and plan.    Lequita Asal, MD, PhD    09/18/2018, 9:51 AM  I, Cloyde Reams Dorshimer, am acting as Education administrator for [employer]. Mike Gip, MD, PhD.  I, Daquawn Seelman C. Mike Gip, MD, have reviewed the above documentation for accuracy and completeness, and I agree with the above.

## 2018-09-17 ENCOUNTER — Other Ambulatory Visit: Payer: Self-pay

## 2018-09-18 ENCOUNTER — Inpatient Hospital Stay: Payer: Medicare PPO

## 2018-09-18 ENCOUNTER — Encounter: Payer: Self-pay | Admitting: Hematology and Oncology

## 2018-09-18 ENCOUNTER — Inpatient Hospital Stay (HOSPITAL_BASED_OUTPATIENT_CLINIC_OR_DEPARTMENT_OTHER): Payer: Medicare PPO | Admitting: Hematology and Oncology

## 2018-09-18 ENCOUNTER — Telehealth: Payer: Self-pay

## 2018-09-18 ENCOUNTER — Inpatient Hospital Stay: Payer: Medicare PPO | Attending: Hematology and Oncology

## 2018-09-18 VITALS — BP 119/89 | HR 85 | Temp 97.7°F | Resp 18 | Wt 215.3 lb

## 2018-09-18 DIAGNOSIS — E538 Deficiency of other specified B group vitamins: Secondary | ICD-10-CM

## 2018-09-18 DIAGNOSIS — D509 Iron deficiency anemia, unspecified: Secondary | ICD-10-CM | POA: Insufficient documentation

## 2018-09-18 DIAGNOSIS — D696 Thrombocytopenia, unspecified: Secondary | ICD-10-CM

## 2018-09-18 LAB — CBC WITH DIFFERENTIAL/PLATELET
Abs Immature Granulocytes: 0.02 10*3/uL (ref 0.00–0.07)
Basophils Absolute: 0.1 10*3/uL (ref 0.0–0.1)
Basophils Relative: 1 %
Eosinophils Absolute: 0.2 10*3/uL (ref 0.0–0.5)
Eosinophils Relative: 3 %
HCT: 42.2 % (ref 39.0–52.0)
Hemoglobin: 14 g/dL (ref 13.0–17.0)
Immature Granulocytes: 0 %
Lymphocytes Relative: 24 %
Lymphs Abs: 1.5 10*3/uL (ref 0.7–4.0)
MCH: 31.3 pg (ref 26.0–34.0)
MCHC: 33.2 g/dL (ref 30.0–36.0)
MCV: 94.2 fL (ref 80.0–100.0)
Monocytes Absolute: 0.6 10*3/uL (ref 0.1–1.0)
Monocytes Relative: 9 %
Neutro Abs: 3.9 10*3/uL (ref 1.7–7.7)
Neutrophils Relative %: 63 %
Platelets: 114 10*3/uL — ABNORMAL LOW (ref 150–400)
RBC: 4.48 MIL/uL (ref 4.22–5.81)
RDW: 13.4 % (ref 11.5–15.5)
WBC: 6.3 10*3/uL (ref 4.0–10.5)
nRBC: 0 % (ref 0.0–0.2)

## 2018-09-18 LAB — COMPREHENSIVE METABOLIC PANEL
ALT: 23 U/L (ref 0–44)
AST: 28 U/L (ref 15–41)
Albumin: 3.6 g/dL (ref 3.5–5.0)
Alkaline Phosphatase: 135 U/L — ABNORMAL HIGH (ref 38–126)
Anion gap: 9 (ref 5–15)
BUN: 33 mg/dL — ABNORMAL HIGH (ref 8–23)
CO2: 26 mmol/L (ref 22–32)
Calcium: 8.8 mg/dL — ABNORMAL LOW (ref 8.9–10.3)
Chloride: 107 mmol/L (ref 98–111)
Creatinine, Ser: 1.31 mg/dL — ABNORMAL HIGH (ref 0.61–1.24)
GFR calc Af Amer: 60 mL/min — ABNORMAL LOW (ref >60)
GFR calc non Af Amer: 51 mL/min — ABNORMAL LOW (ref >60)
Glucose, Bld: 75 mg/dL (ref 70–99)
Potassium: 4.8 mmol/L (ref 3.5–5.1)
Sodium: 142 mmol/L (ref 135–145)
Total Bilirubin: 0.8 mg/dL (ref 0.3–1.2)
Total Protein: 6.6 g/dL (ref 6.5–8.1)

## 2018-09-18 LAB — FERRITIN: Ferritin: 111 ng/mL (ref 24–336)

## 2018-09-18 MED ORDER — CYANOCOBALAMIN 1000 MCG/ML IJ SOLN
1000.0000 ug | Freq: Once | INTRAMUSCULAR | Status: AC
  Administered 2018-09-18: 1000 ug via INTRAMUSCULAR

## 2018-09-18 NOTE — Telephone Encounter (Signed)
Labs forwarded to [facility], PCP per Dr. Kem Parkinson request.

## 2018-09-18 NOTE — Patient Instructions (Signed)

## 2018-09-18 NOTE — Progress Notes (Signed)
Pt here for follow up. Denies any concerns.  

## 2018-10-02 ENCOUNTER — Ambulatory Visit
Admission: EM | Admit: 2018-10-02 | Discharge: 2018-10-02 | Disposition: A | Discharge status: Home / Self Care | Payer: Medicare PPO | Attending: Family | Admitting: Family

## 2018-10-02 ENCOUNTER — Encounter: Payer: Self-pay | Admitting: Emergency Medicine

## 2018-10-02 ENCOUNTER — Other Ambulatory Visit: Payer: Self-pay

## 2018-10-02 DIAGNOSIS — S8002XA Contusion of left knee, initial encounter: Secondary | ICD-10-CM | POA: Diagnosis not present

## 2018-10-02 DIAGNOSIS — W19XXXA Unspecified fall, initial encounter: Secondary | ICD-10-CM | POA: Diagnosis not present

## 2018-10-02 DIAGNOSIS — S61215A Laceration without foreign body of left ring finger without damage to nail, initial encounter: Secondary | ICD-10-CM

## 2018-10-02 MED ORDER — CEPHALEXIN 500 MG PO CAPS: 500.0000 mg | ORAL_CAPSULE | Freq: Two times a day (BID) | ORAL | 0 refills | Status: AC

## 2018-10-02 NOTE — ED Notes (Signed)
Applied Bacitracin ointment to left ring finger. 2x2 gauze applied with Coban. Patient tolerated well.

## 2018-10-02 NOTE — ED Triage Notes (Signed)
Pt fell yesterday while he was sweeping. This occurred about 2:30 yesterday afternoon. He has several contusions and and abrasions on his arms and legs. He did not hit his head at all or loss consciousness.  Pt is on anticoagulants. He has a laceration on his left ring finger. Pt had las tetanus vaccine 12/06/2015

## 2018-10-02 NOTE — ED Provider Notes (Signed)
[facility]    CSN: [id number] Arrival date & time: 10/02/18  0848     History   Chief Complaint Chief Complaint  Patient presents with  . Fall  . Laceration    HPI Jeremy Herrera is a 78 y.o. male.   78 year old male accompanied by his daughter with injuries after a fall yesterday afternoon. He was sweeping around 2:30pm yesterday afternoon when he lost his balance and fell. He did not hit his head but cut his left 4th finger on the side of a table and scraped up his right wrist and left lower leg below the knee. He also hit a small area across his central chest. He did not seek care at the time. Washed the areas and controlled bleeding. This morning he notified his daughter who brought him here for care and possible sutures. He continues to bleed at times from his finger since he is currently on anticoagulants (Eliquist- although unable to find in records). He denies any numbness or loss of sensation.  He has multiple chronic health issues and just had surgery 1 week ago for atrial fibrillation and left atrium appendage closure. He will need to be on anticoagulants for another 6 weeks. Current medications include Lisinopril, Toprol XL, Lasix, K supplements, Lipitor, Nexium, Aricept, Zoloft, various inhalers and eye drops. He recently had an Rx for Amoxicillin to take before dental procedures and thought he was taking this daily but denies recent use.   The history is provided by the patient and a caregiver.    Past Medical History:  Diagnosis Date  . Anemia   . BPH (benign prostatic hyperplasia)   . CHF (congestive heart failure) (HCC)   . Dysphagia   . GERD (gastroesophageal reflux disease)   . HLD (hyperlipidemia)   . HTN (hypertension)   . Irregular heart beat   . Nocturia   . Urinary retention     Patient Active Problem List   Diagnosis Date Noted  . 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HCC) 05/02/2017  . Lung nodule 03/11/2017  . Nausea 12/18/2016  . Diabetic foot ulcer  (HCC) 11/13/2016  . Nocturia 10/17/2014  . History of urinary retention 10/17/2014  . 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  . B12 deficiency 09/30/2014  . Right leg swelling 09/30/2014  . Anemia 09/16/2014  . Thrombocytopenia (HCC) 09/16/2014  . Benign essential HTN 06/08/2014  . 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 Beat, premature ventricular 12/03/2013  . Heart valve disease 12/03/2013  . 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 Diabetes mellitus, type 2 (HCC) 11/20/2013  . Acid reflux 11/20/2013  . Anemia, iron deficiency 11/20/2013  . Abnormal presence of protein in urine 11/20/2013  . 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 AMPUTATION TOE Left 05/04/2017   Procedure: AMPUTATION TOE - third left toe;  Surgeon: Fowler, Justin, DPM;  Location: [facility] ORS;  Service: Podiatry;  Laterality: Left;  . bladder patching  1964  . HIP SURGERY Right 2014  . IRRIGATION AND DEBRIDEMENT FOOT Left 11/14/2016   Procedure: IRRIGATION AND DEBRIDEMENT FOOT;  Surgeon: Fowler, Justin, DPM;  Location: [facility];  Service: Podiatry;  Laterality: Left;  . TRANSURETHRAL RESECTION OF PROSTATE         Home Medications    Prior to Admission medications   Medication Sig Start Date End Date Taking? Authorizing Provider  acetaminophen (TYLENOL) 500 MG tablet Take 500   mg by mouth every 6 (six) hours as needed.    Yes [provider]  albuterol (VENTOLIN HFA) 108 (90 Base) MCG/ACT inhaler Inhale 1-2 puffs into the lungs every 4 (four) hours as needed.  07/07/18  Yes [provider]  aspirin 81 MG tablet Take 81 mg by mouth daily.   Yes [provider]  atorvastatin (LIPITOR) 40 MG tablet Take 40 mg by mouth daily at 6 PM.  06/29/18  Yes [provider]  bimatoprost (LUMIGAN) 0.01 % SOLN 1 drop nightly   Yes [provider]   Cyanocobalamin (B-12) 1000 MCG/ML KIT Inject 1 mL as directed every 30 (thirty) days.   Yes [provider]  cyclobenzaprine (FLEXERIL) 10 MG tablet Take 1 tablet (10 mg total) by mouth 3 (three) times daily as needed for muscle spasms. 05/05/17  Yes Wieting, Tevis, MD  donepezil (ARICEPT) 5 MG tablet Take 5 mg by mouth at bedtime.  08/22/17  Yes [provider]  dorzolamide-timolol (COSOPT) 22.3-6.8 MG/ML ophthalmic solution Place 1 drop into both eyes 2 (two) times daily.    Yes [provider]  esomeprazole (NEXIUM) 20 MG capsule Take 20 mg by mouth daily at 12 noon.   Yes [provider]  ferrous sulfate 325 (65 FE) MG tablet Take 325 mg by mouth daily.   Yes [provider]  Fluticasone-Salmeterol (ADVAIR) 250-50 MCG/DOSE AEPB Inhale 1 puff into the lungs as needed.    Yes [provider]  furosemide (LASIX) 20 MG tablet Take 20 mg by mouth daily.  11/23/14  Yes [provider]  ketoconazole (NIZORAL) 2 % cream Apply 1 application topically daily as needed.  08/31/18  Yes [provider]  lisinopril (ZESTRIL) 2.5 MG tablet Take 2.5 mg by mouth daily.  07/07/18  Yes [provider]  metoprolol succinate (TOPROL-XL) 50 MG 24 hr tablet Take 50 mg by mouth daily.  07/07/18  Yes [provider]  nitroGLYCERIN (NITROSTAT) 0.4 MG SL tablet Place 0.4 mg under the tongue every 5 (five) minutes as needed.  06/17/18  Yes [provider]  potassium chloride (K-DUR) 10 MEQ tablet Take 10 mEq by mouth daily.    Yes [provider]  sertraline (ZOLOFT) 25 MG tablet Take 25 mg by mouth daily.  08/31/18  Yes [provider]  simethicone (MYLICON) 829 MG chewable tablet Chew 125 mg by mouth every 6 (six) hours as needed.    Yes [provider]  umeclidinium-vilanterol (ANORO ELLIPTA) 62.5-25 MCG/INH AEPB Inhale 1 puff into the lungs daily as needed.  08/05/18  Yes [provider]   cephALEXin (KEFLEX) 500 MG capsule Take 1 capsule (500 mg total) by mouth 2 (two) times daily for 7 days. 10/02/18 10/09/18  Katy Apo, NP  EMOLLIENT EX Apply topically daily.     [provider]    Family History Family History  Problem Relation Age of Onset  . Ulcers Mother        stomach  . GER disease Sister        GERD  . Heart attack Father   . Diabetes Brother   . Depression Brother   . Kidney disease Neg Hx   . Prostate cancer Neg Hx   . Kidney cancer Neg Hx   . Bladder Cancer Neg Hx     Social History Social History   Tobacco Use  . Smoking status: Never Smoker  . Smokeless tobacco: Never Used  Substance Use Topics  .  Alcohol use: No  . Drug use: No     Allergies   Hydrocodone-acetaminophen   Review of Systems Review of Systems  Constitutional: Negative for activity change, chills, fatigue and fever.  Respiratory: Negative for chest tightness and wheezing.   Cardiovascular: Positive for palpitations (none recently). Negative for chest pain.  Musculoskeletal: Positive for arthralgias and myalgias.  Skin: Positive for color change and wound. Negative for rash.  Allergic/Immunologic: Positive for immunocompromised state.  Neurological: Negative for tremors, seizures, syncope, facial asymmetry, speech difficulty, weakness, light-headedness, numbness and headaches.  Hematological: Negative for adenopathy. Bruises/bleeds easily.     Physical Exam Triage Vital Signs ED Triage Vitals  Enc Vitals Group     BP 10/02/18 0923 (!) 136/92     Pulse Rate 10/02/18 0923 91     Resp 10/02/18 0923 20     Temp 10/02/18 0923 98.2 F (36.8 C)     Temp Source 10/02/18 0923 Oral     SpO2 10/02/18 0923 100 %     Weight 10/02/18 0914 209 lb (94.8 kg)     Height 10/02/18 0914 6' 1" (1.854 m)     Head Circumference --      Peak Flow --      Pain Score 10/02/18 0912 1     Pain Loc --      Pain Edu? --      Excl. in [place]? --    No data found.  Updated  Vital Signs BP (!) 136/92 (BP Location: Right Arm)   Pulse 91   Temp 98.2 F (36.8 C) (Oral)   Resp 20   Ht 6' 1" (1.854 m)   Wt 209 lb (94.8 kg)   SpO2 100%   BMI 27.57 kg/m   Visual Acuity Right Eye Distance:   Left Eye Distance:   Bilateral Distance:    Right Eye Near:   Left Eye Near:    Bilateral Near:     Physical Exam Vitals signs and nursing note reviewed.  Constitutional:      General: He is awake. He is not in acute distress.    Appearance: He is well-developed and well-groomed. He is not ill-appearing.     Comments: Patient propped up on exam bed in no acute distress, soaking his left hand/fingers.   HENT:     Head: Normocephalic and atraumatic.  Eyes:     Extraocular Movements: Extraocular movements intact.     Conjunctiva/sclera: Conjunctivae normal.  Neck:     Musculoskeletal: Normal range of motion.  Musculoskeletal: Normal range of motion.        General: Swelling, tenderness and signs of injury present. No deformity.     Left hand: He exhibits tenderness, laceration and swelling. He exhibits normal range of motion and normal capillary refill. Normal sensation noted. Normal strength noted.       Hands:     Comments: Mainly straight laceration present at distal aspect of left 4th/ring finger just above DIP on palmer surface. Significant swelling present and tissue is not well approximated. Not currently bleeding. Slightly tender. Good capillary refill and sensation. No distinct neuro deficits noted.   Skin:    General: Skin is warm and dry.     Capillary Refill: Capillary refill takes less than 2 seconds.     Findings: Abrasion, bruising, ecchymosis and wound present. No erythema or rash.          Comments: Slight mild superficial pink abrasion present on central chest. Small abrasion and wound  present on right radial aspect of wrist. Abrasion present on left lower leg just below the knee. No distinct surrounding redness, swelling or discharge present. Not  actively bleeding. Mild tenderness.  Multiple areas of arms and legs with various stages of ecchymosis.   Neurological:     Mental Status: He is alert and oriented to person, place, and time.     Sensory: Sensation is intact.     Motor: Motor function is intact.  Psychiatric:        Mood and Affect: Mood normal.        Behavior: Behavior normal. Behavior is cooperative.      UC Treatments / Results  Labs (all labs ordered are listed, but only abnormal results are displayed) Labs Reviewed - No data to display  EKG   Radiology No results found.  Procedures Laceration Repair  Date/Time: 10/02/2018 11:16 AM Performed by: Katy Apo, NP Authorized by: Katy Apo, NP   Consent:    Consent obtained:  Verbal   Consent given by:  Patient and guardian   Risks discussed:  Infection, pain, poor cosmetic result and poor wound healing   Alternatives discussed:  Observation and no treatment Anesthesia (see MAR for exact dosages):    Anesthesia method:  Local infiltration   Local anesthetic:  Lidocaine 1% w/o epi Laceration details:    Location:  Finger   Finger location:  L ring finger   Length (cm):  1   Depth (mm):  3 Repair type:    Repair type:  Simple Pre-procedure details:    Preparation:  Patient was prepped and draped in usual sterile fashion Exploration:    Hemostasis achieved with:  Direct pressure   Wound exploration: wound explored through full range of motion     Wound extent: no foreign bodies/material noted, no nerve damage noted, no tendon damage noted and no vascular damage noted     Contaminated: no   Treatment:    Area cleansed with:  Betadine and saline   Amount of cleaning:  Standard   Irrigation solution:  Sterile saline   Irrigation volume:  74m   Irrigation method:  Syringe   Foreign body removal: No foreign bodies seen.   Skin repair:    Repair method:  Sutures   Suture size:  5-0   Suture material:  Prolene   Suture technique:   Simple interrupted   Number of sutures:  5 Approximation:    Approximation:  Loose Post-procedure details:    Dressing:  Antibiotic ointment, non-adherent dressing and bulky dressing   Patient tolerance of procedure:  Tolerated well, no immediate complications Comments:     LMarylene Land NP also evaluated wound.  Due to thinness of skin on finger and swelling- edges remained loose and was unable to close wound as desired. Suture material continued to pull through and tear skin. Applied 5 sutures to try to stabilize wound with loose tension. Discussed not ideal cosmetic result and probable scaring. Due to anticoagulant use, area continued to bleed, especially with each suture grab. Applied pressure to control bleeding. Discussed wound may continue to bleed mildly, especially with movement today.    (including critical care time)  Medications Ordered in UC Medications - No data to display  Initial Impression / Assessment and Plan / UC Course  I have reviewed the triage vital signs and the nursing notes.  Pertinent labs & imaging results that were available during my care of the patient were reviewed by me and  considered in my medical decision making (see chart for details).    Applied triple antibiotic ointment to wound and abrasions and covered with non-stick gauze. Applied coban pressure dressing on left finger. Started patient on Keflex 514m twice a day as directed. Do not take Amoxicillin at same time. Keep area clean and dry for at least 24 hours. Then wash with soap and water and keep covered. May take Tylenol 10041mevery 8 hours as needed for pain. Recommend follow-up with his doctor in 1 week as scheduled. Recommend suture removal in 7 to 10 days. If any increased redness, pain, swelling or discharge occurs, follow-up here or at his PCP for recheck. Otherwise, follow-up with his doctor as planned.  Final Clinical Impressions(s) / UC Diagnoses   Final diagnoses:  Laceration of left  ring finger without foreign body without damage to nail, initial encounter  Contusion of left knee, initial encounter  Fall, initial encounter     Discharge Instructions     We placed 5 stitches in your left finger today. Keep area covered and dry for the next 24 hours. Then wash area with soap and water and keep covered. Start Keflex [id number] a day to help prevent infection. May take Tylenol 1000m64mery 8 hours as needed for pain. Recommend follow-up with your doctor in 1 week as scheduled. Will remove stitches in 7 to 10 days. If any increased redness, swelling, discharge or increased pain occurs, follow-up for recheck. Otherwise follow-up with your doctor as planned.     ED Prescriptions    Medication Sig Dispense Auth. Provider   cephALEXin (KEFLEX) 500 MG capsule Take 1 capsule (500 mg total) by mouth 2 (two) times daily for 7 days. 14 capsule AmyoKaty Apo     Controlled Substance Prescriptions [place] Controlled Substance Registry consulted? Not Applicable   AmyoKaty Apo 10/02/18 2119

## 2018-10-02 NOTE — Discharge Instructions (Addendum)
We placed 5 stitches in your left finger today. Keep area covered and dry for the next 24 hours. Then wash area with soap and water and keep covered. Start Keflex 500mg  twice a day to help prevent infection. May take Tylenol 1000mg  every 8 hours as needed for pain. Recommend follow-up with your doctor in 1 week as scheduled. Will remove stitches in 7 to 10 days. If any increased redness, swelling, discharge or increased pain occurs, follow-up for recheck. Otherwise follow-up with your doctor as planned.

## 2018-10-14 ENCOUNTER — Other Ambulatory Visit: Payer: Self-pay

## 2018-10-14 ENCOUNTER — Ambulatory Visit
Admission: EM | Admit: 2018-10-14 | Discharge: 2018-10-14 | Disposition: A | Discharge status: Home / Self Care | Payer: Medicare PPO

## 2018-10-14 NOTE — ED Triage Notes (Signed)
On 12th had lac repair completed here.  Instructions for removal in 7-10 days.   Ring finger of left hand.   5 sutures were placed.

## 2018-10-15 ENCOUNTER — Other Ambulatory Visit: Payer: Self-pay

## 2018-10-16 ENCOUNTER — Inpatient Hospital Stay: Payer: Medicare PPO | Attending: Hematology and Oncology

## 2018-10-16 VITALS — BP 112/75 | HR 75 | Temp 98.8°F | Resp 20

## 2018-10-16 DIAGNOSIS — E538 Deficiency of other specified B group vitamins: Secondary | ICD-10-CM | POA: Insufficient documentation

## 2018-10-16 MED ORDER — CYANOCOBALAMIN 1000 MCG/ML IJ SOLN
1000.0000 ug | Freq: Once | INTRAMUSCULAR | Status: AC
  Administered 2018-10-16: 1000 ug via INTRAMUSCULAR

## 2018-11-13 ENCOUNTER — Inpatient Hospital Stay: Payer: Medicare PPO | Attending: Hematology and Oncology

## 2018-11-13 ENCOUNTER — Other Ambulatory Visit: Payer: Self-pay

## 2018-11-13 ENCOUNTER — Inpatient Hospital Stay: Payer: Medicare PPO

## 2018-11-13 VITALS — BP 102/59 | HR 72 | Temp 97.9°F | Resp 18

## 2018-11-13 DIAGNOSIS — E538 Deficiency of other specified B group vitamins: Secondary | ICD-10-CM | POA: Diagnosis not present

## 2018-11-13 MED ORDER — CYANOCOBALAMIN 1000 MCG/ML IJ SOLN
1000.0000 ug | Freq: Once | INTRAMUSCULAR | Status: AC
  Administered 2018-11-13: 1000 ug via INTRAMUSCULAR

## 2018-11-13 NOTE — Patient Instructions (Signed)

## 2018-12-10 ENCOUNTER — Other Ambulatory Visit: Payer: Self-pay

## 2018-12-11 ENCOUNTER — Inpatient Hospital Stay: Payer: Medicare PPO | Attending: Hematology and Oncology

## 2018-12-11 ENCOUNTER — Inpatient Hospital Stay: Payer: Medicare PPO

## 2018-12-11 VITALS — BP 105/69 | HR 79 | Temp 97.8°F | Resp 18

## 2018-12-11 DIAGNOSIS — D696 Thrombocytopenia, unspecified: Secondary | ICD-10-CM

## 2018-12-11 DIAGNOSIS — E538 Deficiency of other specified B group vitamins: Secondary | ICD-10-CM | POA: Diagnosis not present

## 2018-12-11 LAB — CBC WITH DIFFERENTIAL/PLATELET
Abs Immature Granulocytes: 0.03 10*3/uL (ref 0.00–0.07)
Basophils Absolute: 0 10*3/uL (ref 0.0–0.1)
Basophils Relative: 1 %
Eosinophils Absolute: 0.1 10*3/uL (ref 0.0–0.5)
Eosinophils Relative: 2 %
HCT: 39.7 % (ref 39.0–52.0)
Hemoglobin: 13.3 g/dL (ref 13.0–17.0)
Immature Granulocytes: 1 %
Lymphocytes Relative: 34 %
Lymphs Abs: 1.9 10*3/uL (ref 0.7–4.0)
MCH: 31.7 pg (ref 26.0–34.0)
MCHC: 33.5 g/dL (ref 30.0–36.0)
MCV: 94.5 fL (ref 80.0–100.0)
Monocytes Absolute: 0.4 10*3/uL (ref 0.1–1.0)
Monocytes Relative: 7 %
Neutro Abs: 3.2 10*3/uL (ref 1.7–7.7)
Neutrophils Relative %: 55 %
Platelets: 126 10*3/uL — ABNORMAL LOW (ref 150–400)
RBC: 4.2 MIL/uL — ABNORMAL LOW (ref 4.22–5.81)
RDW: 14.4 % (ref 11.5–15.5)
WBC: 5.7 10*3/uL (ref 4.0–10.5)
nRBC: 0 % (ref 0.0–0.2)

## 2018-12-11 LAB — FERRITIN: Ferritin: 96 ng/mL (ref 24–336)

## 2018-12-11 MED ORDER — CYANOCOBALAMIN 1000 MCG/ML IJ SOLN
1000.0000 ug | Freq: Once | INTRAMUSCULAR | Status: AC
  Administered 2018-12-11: 1000 ug via INTRAMUSCULAR
  Filled 2018-12-11: qty 1

## 2019-01-07 ENCOUNTER — Other Ambulatory Visit: Payer: Self-pay

## 2019-01-08 ENCOUNTER — Inpatient Hospital Stay: Payer: Medicare PPO | Attending: Hematology and Oncology

## 2019-01-08 VITALS — BP 110/74 | HR 111 | Temp 96.0°F | Resp 18

## 2019-01-08 DIAGNOSIS — E538 Deficiency of other specified B group vitamins: Secondary | ICD-10-CM | POA: Diagnosis present

## 2019-01-08 MED ORDER — CYANOCOBALAMIN 1000 MCG/ML IJ SOLN
1000.0000 ug | Freq: Once | INTRAMUSCULAR | Status: AC
  Administered 2019-01-08: 1000 ug via INTRAMUSCULAR

## 2019-01-08 NOTE — Patient Instructions (Signed)

## 2019-02-05 ENCOUNTER — Inpatient Hospital Stay: Payer: Medicare PPO

## 2019-02-06 ENCOUNTER — Other Ambulatory Visit: Payer: Self-pay

## 2019-02-06 ENCOUNTER — Inpatient Hospital Stay: Payer: Medicare PPO | Attending: Hematology and Oncology

## 2019-02-06 VITALS — BP 95/66 | HR 91 | Temp 96.2°F | Resp 18

## 2019-02-06 DIAGNOSIS — E538 Deficiency of other specified B group vitamins: Secondary | ICD-10-CM | POA: Diagnosis not present

## 2019-02-06 MED ORDER — CYANOCOBALAMIN 1000 MCG/ML IJ SOLN
1000.0000 ug | Freq: Once | INTRAMUSCULAR | Status: AC
  Administered 2019-02-06: 1000 ug via INTRAMUSCULAR

## 2019-02-06 MED ORDER — CYANOCOBALAMIN 1000 MCG/ML IJ SOLN
INTRAMUSCULAR | Status: AC
  Filled 2019-02-06: qty 1

## 2019-02-12 ENCOUNTER — Inpatient Hospital Stay: Payer: Medicare PPO

## 2019-03-03 ENCOUNTER — Other Ambulatory Visit: Payer: Self-pay

## 2019-03-03 DIAGNOSIS — D696 Thrombocytopenia, unspecified: Secondary | ICD-10-CM

## 2019-03-03 DIAGNOSIS — E538 Deficiency of other specified B group vitamins: Secondary | ICD-10-CM

## 2019-03-03 DIAGNOSIS — D509 Iron deficiency anemia, unspecified: Secondary | ICD-10-CM

## 2019-03-04 ENCOUNTER — Inpatient Hospital Stay: Payer: Medicare PPO

## 2019-03-04 ENCOUNTER — Encounter: Payer: Self-pay | Admitting: Hematology and Oncology

## 2019-03-04 ENCOUNTER — Inpatient Hospital Stay: Payer: Medicare PPO | Attending: Hematology and Oncology

## 2019-03-04 VITALS — BP 125/80 | HR 90 | Temp 96.3°F | Resp 20

## 2019-03-04 DIAGNOSIS — E538 Deficiency of other specified B group vitamins: Secondary | ICD-10-CM | POA: Diagnosis not present

## 2019-03-04 DIAGNOSIS — Z7982 Long term (current) use of aspirin: Secondary | ICD-10-CM | POA: Diagnosis not present

## 2019-03-04 DIAGNOSIS — D696 Thrombocytopenia, unspecified: Secondary | ICD-10-CM | POA: Insufficient documentation

## 2019-03-04 DIAGNOSIS — D509 Iron deficiency anemia, unspecified: Secondary | ICD-10-CM

## 2019-03-04 DIAGNOSIS — Z7901 Long term (current) use of anticoagulants: Secondary | ICD-10-CM | POA: Diagnosis not present

## 2019-03-04 DIAGNOSIS — I4891 Unspecified atrial fibrillation: Secondary | ICD-10-CM | POA: Insufficient documentation

## 2019-03-04 LAB — CBC WITH DIFFERENTIAL/PLATELET
Abs Immature Granulocytes: 0.01 10*3/uL (ref 0.00–0.07)
Basophils Absolute: 0.1 10*3/uL (ref 0.0–0.1)
Basophils Relative: 1 %
Eosinophils Absolute: 0.2 10*3/uL (ref 0.0–0.5)
Eosinophils Relative: 4 %
HCT: 40.9 % (ref 39.0–52.0)
Hemoglobin: 13.9 g/dL (ref 13.0–17.0)
Immature Granulocytes: 0 %
Lymphocytes Relative: 28 %
Lymphs Abs: 1.6 10*3/uL (ref 0.7–4.0)
MCH: 31.4 pg (ref 26.0–34.0)
MCHC: 34 g/dL (ref 30.0–36.0)
MCV: 92.5 fL (ref 80.0–100.0)
Monocytes Absolute: 0.5 10*3/uL (ref 0.1–1.0)
Monocytes Relative: 9 %
Neutro Abs: 3.4 10*3/uL (ref 1.7–7.7)
Neutrophils Relative %: 58 %
Platelets: 131 10*3/uL — ABNORMAL LOW (ref 150–400)
RBC: 4.42 MIL/uL (ref 4.22–5.81)
RDW: 13.3 % (ref 11.5–15.5)
WBC: 5.8 10*3/uL (ref 4.0–10.5)
nRBC: 0 % (ref 0.0–0.2)

## 2019-03-04 LAB — COMPREHENSIVE METABOLIC PANEL
ALT: 17 U/L (ref 0–44)
AST: 21 U/L (ref 15–41)
Albumin: 3.6 g/dL (ref 3.5–5.0)
Alkaline Phosphatase: 133 U/L — ABNORMAL HIGH (ref 38–126)
Anion gap: 8 (ref 5–15)
BUN: 26 mg/dL — ABNORMAL HIGH (ref 8–23)
CO2: 25 mmol/L (ref 22–32)
Calcium: 8.8 mg/dL — ABNORMAL LOW (ref 8.9–10.3)
Chloride: 106 mmol/L (ref 98–111)
Creatinine, Ser: 1.25 mg/dL — ABNORMAL HIGH (ref 0.61–1.24)
GFR calc Af Amer: >60 mL/min (ref >60)
GFR calc non Af Amer: 54 mL/min — ABNORMAL LOW (ref >60)
Glucose, Bld: 118 mg/dL — ABNORMAL HIGH (ref 70–99)
Potassium: 4.9 mmol/L (ref 3.5–5.1)
Sodium: 139 mmol/L (ref 135–145)
Total Bilirubin: 1.1 mg/dL (ref 0.3–1.2)
Total Protein: 6.9 g/dL (ref 6.5–8.1)

## 2019-03-04 LAB — VITAMIN B12: Vitamin B-12: 539 pg/mL (ref 180–914)

## 2019-03-04 LAB — FOLATE: Folate: 19.9 ng/mL (ref >5.9)

## 2019-03-04 LAB — FERRITIN: Ferritin: 141 ng/mL (ref 24–336)

## 2019-03-04 MED ORDER — CYANOCOBALAMIN 1000 MCG/ML IJ SOLN
1000.0000 ug | Freq: Once | INTRAMUSCULAR | Status: AC
  Administered 2019-03-04: 1000 ug via INTRAMUSCULAR

## 2019-03-04 NOTE — Progress Notes (Signed)
[facility]  [address] [place], [place] [address] Phone: [phone]  Fax: [phone]   Clinic Day:  03/05/2019  Referring physician: Sofie Hartigan, MD  Chief Complaint: Jeremy Herrera is a 79 y.o. male with B12 deficiency, iron deficiency, and mild thrombocytopenia (ITP) who is seen for 6 month assessment.   HPI: The patient was last seen in the hematology clinic on 09/18/2018. At that time,  he was doing "pretty good",  Exam revealed no adenopathy or hepatosplenomegaly.  Hematocrit was 42.2, hemoglobin 14.0, MCV 94.2, WBC 6300 and platelets 114,000.  Ferritin was 111.  He received monthly B-12 injections (10/16/2018 - 03/04/2019).  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  He began Eliquis 2.5 mg BID + ASA 81 mg qd for 45 days followed by clopidogrel for 6 months.   He was seen for diarrhea and black stools with Dr. Ellison Hughs on 12/23/2018. Stool studies were negative.  Hemoccult on 12/24/2018 were negative.  CBC followed 12/11/2018: Hematocrit 39.7, hemoglobin 13.3, MCV 94.5, platelets 126,000, WBC 5700, ANC 3200. Ferritin 96. 12/24/2018: Hematocrit 38.6, hemoglobin 13.1, MCV 94.8, platelets 140,000, WBC 6400, ANC 3600.  03/04/2019: Hematocrit 40.9, hemoglobin 13.9, MCV 92.5, platelets 131,000, WBC 5800, ANC 3400. Ferritin 141. Folate 19.9 and B-12 539.  During the interim, he has had issues with equilibrium. He will be standing and has sudden feeling that he has to catch himself from falling. He has had no falls. He was on Eliquis back in August and when seen for follow up in 10/2018, he was given Plavix + 81 mg ASA.  Symptomatically, he doesn't feel dizzy or light headed at present time.  He notes easily bruising but no bleeding. He believes the Watchman's procedure has helped.    He is on Lasix everyday.  He is drinking more water and hasn't been voiding as frequently. He has not had any falls since 10/01/2018. He notes having his rolling walker helps him much better than his cane.  He will take a walk around his yard and will get out of breath. He gets short of breath quickly but once he sits for a few minutes, he is back to normal. Wearing a mask makes it harder for him to breathe normally as well.    Past Medical History:  Diagnosis Date  . Anemia   . BPH (benign prostatic hyperplasia)   . CHF (congestive heart failure) ([place])   . Dysphagia   . GERD (gastroesophageal reflux disease)   . HLD (hyperlipidemia)   . HTN (hypertension)   . Irregular heart beat   . Nocturia   . Urinary retention     Past Surgical History:  Procedure Laterality Date  . AMPUTATION TOE Left 05/04/2017   Procedure: AMPUTATION TOE - third left toe;  Surgeon: Samara Deist, DPM;  Location: [facility] ORS;  Service: Podiatry;  Laterality: Left;  . bladder patching  1964  . HIP SURGERY Right 2014  . IRRIGATION AND DEBRIDEMENT FOOT Left 11/14/2016   Procedure: IRRIGATION AND DEBRIDEMENT FOOT;  Surgeon: Samara Deist, DPM;  Location: [facility] ORS;  Service: Podiatry;  Laterality: Left;  . TRANSURETHRAL RESECTION OF PROSTATE      Family History  Problem Relation Age of Onset  . Ulcers Mother        stomach  . GER disease Sister  GERD  . Heart attack Father   . Diabetes Brother   . Depression Brother   . Kidney disease Neg Hx   . Prostate cancer Neg Hx   . Kidney cancer Neg Hx   . Bladder Cancer Neg Hx     Social History:  reports that he has never smoked. He has never used smokeless tobacco. He reports that he does not drink alcohol or use drugs. He lives in [place] by himself. He has been staying with his daughter the past month following two hospitalizations. He lives in [place].  The patient is alone today.  Allergies:  Allergies  Allergen Reactions  .  Hydrocodone-Acetaminophen Nausea And Vomiting    Current Medications: Current Outpatient Medications  Medication Sig Dispense Refill  . acetaminophen (TYLENOL) 500 MG tablet Take 500 mg by mouth every 6 (six) hours as needed.     Marland Kitchen albuterol (VENTOLIN HFA) 108 (90 Base) MCG/ACT inhaler Inhale 1-2 puffs into the lungs every 4 (four) hours as needed.     Marland Kitchen aspirin 81 MG tablet Take 81 mg by mouth daily.    Marland Kitchen atorvastatin (LIPITOR) 40 MG tablet Take 40 mg by mouth daily at 6 PM.     . bimatoprost (LUMIGAN) 0.01 % SOLN 1 drop nightly    . clopidogrel (PLAVIX) 75 MG tablet Take by mouth.    . Cyanocobalamin (B-12) 1000 MCG/ML KIT Inject 1 mL as directed every 30 (thirty) days.    . cyclobenzaprine (FLEXERIL) 10 MG tablet Take 1 tablet (10 mg total) by mouth 3 (three) times daily as needed for muscle spasms. 20 tablet 0  . donepezil (ARICEPT) 5 MG tablet Take 5 mg by mouth at bedtime.     . dorzolamide-timolol (COSOPT) 22.3-6.8 MG/ML ophthalmic solution Place 1 drop into both eyes 2 (two) times daily.     Marland Kitchen EMOLLIENT EX Apply topically daily.     Marland Kitchen esomeprazole (NEXIUM) 20 MG capsule Take 20 mg by mouth daily at 12 noon.    . ferrous sulfate 325 (65 FE) MG tablet Take 325 mg by mouth daily.    . Fluticasone-Salmeterol (ADVAIR) 250-50 MCG/DOSE AEPB Inhale 1 puff into the lungs as needed.     . furosemide (LASIX) 20 MG tablet Take 20 mg by mouth daily.     Marland Kitchen ketoconazole (NIZORAL) 2 % cream Apply 1 application topically daily as needed.     Marland Kitchen lisinopril (ZESTRIL) 2.5 MG tablet Take 2.5 mg by mouth daily.     . metoprolol succinate (TOPROL-XL) 50 MG 24 hr tablet Take 50 mg by mouth daily.     . potassium chloride (K-DUR) 10 MEQ tablet Take 10 mEq by mouth daily.     . sertraline (ZOLOFT) 25 MG tablet Take 25 mg by mouth daily.     . simethicone (MYLICON) 295 MG chewable tablet Chew 125 mg by mouth every 6 (six) hours as needed.     . umeclidinium-vilanterol (ANORO ELLIPTA) 62.5-25 MCG/INH AEPB  Inhale 1 puff into the lungs daily as needed.     . nitroGLYCERIN (NITROSTAT) 0.4 MG SL tablet Place 0.4 mg under the tongue every 5 (five) minutes as needed.      No current facility-administered medications for this visit.    Review of Systems  Constitutional: Negative.  Negative for chills, diaphoresis, fever, malaise/fatigue and weight loss (up 24 lbs).       Feels "pretty good".  Energy level is " not too bad".  HENT: Negative.  Negative for  congestion, ear pain, hearing loss, nosebleeds, sinus pain and sore throat.   Eyes: Negative.  Negative for blurred vision and double vision.  Respiratory: Positive for shortness of breath (exertional). Negative for cough, hemoptysis and sputum production.   Cardiovascular: Negative.  Negative for chest pain, palpitations, orthopnea, leg swelling and PND.  Gastrointestinal: Negative.  Negative for abdominal pain, blood in stool, constipation, diarrhea, melena, nausea and vomiting.  Genitourinary: Negative.  Negative for dysuria, frequency, hematuria and urgency.  Musculoskeletal: Negative for back pain, falls, joint pain and myalgias.       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  Skin: Negative.  Negative for itching and rash.  Neurological: Positive for sensory change (neuropathy in feet; stable) and weakness (generalized). Negative for dizziness, tremors, focal weakness and headaches.       Balance issues.  Endo/Heme/Allergies: Bruises/bleeds easily (easy bruising only).  Psychiatric/Behavioral: Negative.  Negative for depression and memory loss. The patient is not nervous/anxious and does not have insomnia.   All other systems reviewed and are negative.  Performance status (ECOG):  1-2  Vitals Blood pressure (!) 92/54, pulse 81, temperature (!) 96.3 F (35.7 C), temperature source Tympanic, weight 239 lb 6.7 oz (108.6 kg), SpO2 100 %.   Physical Exam  Constitutional: He is oriented to person, place, and time. He appears well-developed and  well-nourished.  He has a rolling walker at his side.  HENT:  Head: Normocephalic and atraumatic.  Mouth/Throat: Oropharynx is clear and moist. No oropharyngeal exudate.  Gray hair.  Male pattern baldness.  Dry mouth.  Mask.  Eyes: Pupils are equal, round, and reactive to light. Conjunctivae and EOM are normal. No scleral icterus.  Glasses.  Neck: No JVD present.  Cardiovascular: Normal rate, regular rhythm and normal heart sounds.  No murmur heard. Pulmonary/Chest: Effort normal and breath sounds normal. No respiratory distress. He has no wheezes. He has no rales.  Abdominal: Soft. Bowel sounds are normal. He exhibits no distension. There is no abdominal tenderness. There is no rebound and no guarding.  Musculoskeletal:        General: Edema (chronic 1+ to BLE;  L > R) present. No tenderness (BLE). Normal range of motion.     Cervical back: Normal range of motion and neck supple.  Lymphadenopathy:    He has no cervical adenopathy.    He has no axillary adenopathy.       Right: No supraclavicular adenopathy present.       Left: No supraclavicular adenopathy present.  Neurological: He is alert and oriented to person, place, and time. Gait (requires walker) abnormal.  Skin: Skin is warm and dry. Bruising (scattered to BUE) noted. He is not diaphoretic. No cyanosis or erythema. Nails show no clubbing.  Upper extremity bruises.  Psychiatric: He has a normal mood and affect. His behavior is normal. Judgment and thought content normal.  Nursing note and vitals reviewed.   Appointment on 03/04/2019  Component Date Value Ref Range Status  . Vitamin B-12 03/04/2019 539  180 - 914 pg/mL Final   Comment: (NOTE) This assay is not validated for testing neonatal or myeloproliferative syndrome specimens for Vitamin B12 levels. Performed at [facility], [place] [address] [place], [place] [address]   . Folate 03/04/2019 19.9  >5.9 ng/mL Final   Performed at [facility], [address] [place], [place] [address]  . Ferritin 03/04/2019 141  24 - 336 ng/mL Final   Performed at [facility], [address] [place], [name] [address]  . WBC 03/04/2019 5.8  4.0 - 10.5 K/uL Final  . RBC 03/04/2019 4.42  4.22 - 5.81 MIL/uL Final  . Hemoglobin 03/04/2019 13.9  13.0 - 17.0 g/dL Final  . HCT 03/04/2019 40.9  39.0 - 52.0 % Final  . MCV 03/04/2019 92.5  80.0 - 100.0 fL Final  . MCH 03/04/2019 31.4  26.0 - 34.0 pg Final  . MCHC 03/04/2019 34.0  30.0 - 36.0 g/dL Final  . RDW 03/04/2019 13.3  11.5 - 15.5 % Final  . Platelets 03/04/2019 131* 150 - 400 K/uL Final  . nRBC 03/04/2019 0.0  0.0 - 0.2 % Final  . Neutrophils Relative % 03/04/2019 58  % Final  . Neutro Abs 03/04/2019 3.4  1.7 - 7.7 K/uL Final  . Lymphocytes Relative 03/04/2019 28  % Final  . Lymphs Abs 03/04/2019 1.6  0.7 - 4.0 K/uL Final  . Monocytes Relative 03/04/2019 9  % Final  . Monocytes Absolute 03/04/2019 0.5  0.1 - 1.0 K/uL Final  . Eosinophils Relative 03/04/2019 4  % Final  . Eosinophils Absolute 03/04/2019 0.2  0.0 - 0.5 K/uL Final  . Basophils Relative 03/04/2019 1  % Final  . Basophils Absolute 03/04/2019 0.1  0.0 - 0.1 K/uL Final  . Immature Granulocytes 03/04/2019 0  % Final  . Abs Immature Granulocytes 03/04/2019 0.01  0.00 - 0.07 K/uL Final   Performed at [facility], [address] [place], [place] [address]  . Sodium 03/04/2019 139  135 - 145 mmol/L Final  . Potassium 03/04/2019 4.9  3.5 - 5.1 mmol/L Final  . Chloride 03/04/2019 106  98 - 111 mmol/L Final  . CO2 03/04/2019 25  22 - 32 mmol/L Final  . Glucose, Bld 03/04/2019 118* 70 - 99 mg/dL Final  . BUN 03/04/2019 26* 8 - 23 mg/dL Final  . Creatinine, Ser 03/04/2019 1.25* 0.61 - 1.24 mg/dL Final  . Calcium 03/04/2019 8.8* 8.9 - 10.3 mg/dL Final  . Total Protein 03/04/2019 6.9  6.5 - 8.1 g/dL Final  . Albumin 03/04/2019 3.6  3.5 - 5.0 g/dL Final  . AST 03/04/2019 21  15 - 41 U/L Final  . ALT 03/04/2019 17  0  - 44 U/L Final  . Alkaline Phosphatase 03/04/2019 133* 38 - 126 U/L Final  . Total Bilirubin 03/04/2019 1.1  0.3 - 1.2 mg/dL Final  . GFR calc non Af Amer 03/04/2019 54* >60 mL/min Final  . GFR calc Af Amer 03/04/2019 >60  >60 mL/min Final  . Anion gap 03/04/2019 8  5 - 15 Final   Performed at [facility], [address] [place], [place] [address]    Assessment:  Jeremy Herrera is a 79 y.o. 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 He denies any new medications or herbal products. He has no known autoimmune disease. He has never required transfusion.   Work-up on 09/16/2014 revealed a hematocrit 36.4, hemoglobin 12.3, MCV 91.5, platelets 124,000 white count 4800 with an ANC of 3000. Differential was unremarkable. B12 was 262 (low normal) with an elevated MMA consistent with B12 deficiency. He began weekly B12 on 10/14/2014 (last 03/04/2019). Folatewas 18.1 on 08/30/2016 and 12.6 on 08/08/2017.  Normal labs included: folate, CMP, ANA, iron studies, hepatitis C antibody, hepatitis B surface antigen and core antibody, HIV testing, SPEP, free light chains, and PTT. TSH was normal on 08/25/2014. UPEP revealed no monoclonal protein.  He has a history ofiron deficiency anemia. EGD and colonoscopy 10 years ago were negative. He required esophageal dilatation. He has a history of reflux. Colonoscopy 3-4 years ago was unremarkable per the patient. His diet is good. He eats meat twice a week. He is on oral iron.  Ferritinhas been followed: 61 on 11/24/2015, 73 on 02/23/2016, 79 on 05/24/2016, 83 on 07/04/2016, 87 on 08/30/2016, 85 on 02/21/2017, 164 on 05/16/2017, 100 on 08/08/2017, 87 on 10/31/2017, 110 on 01/23/2018, and 141 on 03/04/2019.  He was diagnosed with a community acquired pneumoniaon 05/20/2015. He is completing a Z pack. Chest CT angiogramon 05/20/2015 revealed a  nodular focus of consolidation (indeterminate) for which neoplasm could not be completely excluded. Recommendation was for chest CT without contrast 1-2 months.   Chest CTon 07/05/2015 revealed resolved medial right lower lobe consolidation with residual mild ground-glass opacity. There was mild patchy subpleural reticulation and ground-glass attenuation in both lungs, cannot exclude interstitial lung disease such as nonspecific interstitial pneumonia (NSIP).   High resolution chest CTon 07/04/2016 was compatible with interstitial lung disease. The overall pattern of disease was nonspecific, however, there was clear evidence of progression compared to the prior study 07/05/2015. Findings are worrisome for potential usual interstitial pneumonia (UIP), however, this may simply reflect chronic hypersensitivity pneumonitis or nonspecific interstitial pneumonia (NSIP). Accordingly, repeat high-resolution chest CT is recommended in 12 months to assess for temporal changes in the appearance of the lung parenchyma. Most pulmonary nodules are stable except a 7 x 5 mm nodule in the left lower lobe which has slightly enlarged. Attention at time of follow-up examination in 1 year was recommended.   High resolution chest CTon 07/04/2017 revealed a spectrum of findings suggestive of a mild nonprogressive fibrotic interstitial lung disease. 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 There were scattered solid pulmonary nodules stable since 07/04/2016 chest CT and considered benign. Ground-glass micronodularity visualized predominantly at the lung bases on the prior scan was largely resolved, compatible with resolved inflammatory nodularity.  He has a history of an elevated alkaline phosphatase(138-151) without trend since 09/2011.  He underwent Watchmen left atrial appendage  closure device surgery on 09/25/2018.  Symptomatically, he feels "pretty good".  He gets winded easily.  He denies any bleeding.  Exam Is stable.  Plan: 1.   Review labs from 03/04/2019. 2.   Thrombocytopenia Labs reviewed. Platelets stable to improved at 131,000. He denies any increased bruising or bleeding. Continue to monitor. 3.   Iron deficiency anemia, resolved Hematocrit 40.9.  Hemoglobin 13.9. Ferritin 141.  Continue to monitor. 4.   B12 deficiency B12 was 539 and folate 19.9 today. B12 monthly x 6 (last 03/04/2019). 5.   Atrial fibrillation He is on aspirin and Plavix. He underwent Watchmen left atrial appendage closure device surgery on 09/25/2018. 6.   Blood pressure  Blood pressure slightly low today.    Check orthostatics. 7.   RTC in 3 months for labs (CBC with diff, ferritin) and B12. 8.   RTC in 6 months for MD assessment and labs (CBC with diff, CMP, ferritin) and B12.  I discussed the assessment and treatment plan with the patient.  The patient was provided an opportunity to ask questions and all were answered.  The patient agreed with the plan and demonstrated an understanding of the instructions.  The patient was advised to call back if the symptoms worsen or if the  condition fails to improve as anticipated.  I provided 15 minutes (2:55 PM - 3:10 PM) of face-to-face time during this this encounter and > 50% was spent counseling as documented under my assessment and plan.  An additional 10 minutes were spent reviewing his notes and labs in [place].   Lequita Asal, MD, PhD    03/05/2019, 3:10 PM  I, Samul Dada, am acting as a scribe for Lequita Asal, MD.  I, [place] Mike Gip, MD, have reviewed the above documentation for accuracy and completeness, and I agree with the above.

## 2019-03-05 ENCOUNTER — Inpatient Hospital Stay: Payer: Medicare PPO | Admitting: Hematology and Oncology

## 2019-03-05 ENCOUNTER — Encounter: Payer: Self-pay | Admitting: Hematology and Oncology

## 2019-03-05 ENCOUNTER — Other Ambulatory Visit: Payer: Self-pay

## 2019-03-05 ENCOUNTER — Ambulatory Visit: Payer: Medicare PPO

## 2019-03-05 ENCOUNTER — Other Ambulatory Visit: Payer: Medicare PPO

## 2019-03-05 VITALS — BP 92/54 | HR 81 | Temp 96.3°F | Wt 239.4 lb

## 2019-03-05 DIAGNOSIS — D696 Thrombocytopenia, unspecified: Secondary | ICD-10-CM

## 2019-03-05 DIAGNOSIS — E538 Deficiency of other specified B group vitamins: Secondary | ICD-10-CM

## 2019-03-05 DIAGNOSIS — D509 Iron deficiency anemia, unspecified: Secondary | ICD-10-CM | POA: Diagnosis not present

## 2019-04-02 ENCOUNTER — Inpatient Hospital Stay: Payer: Medicare PPO

## 2019-04-02 ENCOUNTER — Other Ambulatory Visit: Payer: Self-pay

## 2019-04-03 ENCOUNTER — Other Ambulatory Visit: Payer: Self-pay

## 2019-04-03 ENCOUNTER — Inpatient Hospital Stay: Payer: Medicare PPO | Attending: Hematology and Oncology

## 2019-04-03 VITALS — BP 111/73 | HR 75 | Temp 97.2°F | Resp 20

## 2019-04-03 DIAGNOSIS — E538 Deficiency of other specified B group vitamins: Secondary | ICD-10-CM

## 2019-04-03 MED ORDER — CYANOCOBALAMIN 1000 MCG/ML IJ SOLN
1000.0000 ug | Freq: Once | INTRAMUSCULAR | Status: AC
  Administered 2019-04-03: 16:00:00 1000 ug via INTRAMUSCULAR

## 2019-04-03 NOTE — Patient Instructions (Signed)

## 2019-04-30 ENCOUNTER — Inpatient Hospital Stay: Payer: Medicare PPO

## 2019-04-30 ENCOUNTER — Inpatient Hospital Stay: Payer: Medicare PPO | Attending: Hematology and Oncology

## 2019-04-30 ENCOUNTER — Other Ambulatory Visit: Payer: Self-pay

## 2019-04-30 VITALS — BP 90/59 | HR 68 | Temp 97.3°F | Resp 18

## 2019-04-30 DIAGNOSIS — E538 Deficiency of other specified B group vitamins: Secondary | ICD-10-CM | POA: Insufficient documentation

## 2019-04-30 MED ORDER — CYANOCOBALAMIN 1000 MCG/ML IJ SOLN
1000.0000 ug | Freq: Once | INTRAMUSCULAR | Status: AC
  Administered 2019-04-30: 1000 ug via INTRAMUSCULAR

## 2019-05-06 ENCOUNTER — Ambulatory Visit (INDEPENDENT_AMBULATORY_CARE_PROVIDER_SITE_OTHER): Payer: Medicare PPO | Admitting: Vascular Surgery

## 2019-05-06 ENCOUNTER — Other Ambulatory Visit: Payer: Self-pay

## 2019-05-06 ENCOUNTER — Encounter (INDEPENDENT_AMBULATORY_CARE_PROVIDER_SITE_OTHER): Payer: Self-pay | Admitting: Vascular Surgery

## 2019-05-06 ENCOUNTER — Encounter (INDEPENDENT_AMBULATORY_CARE_PROVIDER_SITE_OTHER): Payer: Self-pay

## 2019-05-06 VITALS — BP 111/72 | HR 85 | Resp 16 | Wt 243.4 lb

## 2019-05-06 DIAGNOSIS — E1140 Type 2 diabetes mellitus with diabetic neuropathy, unspecified: Secondary | ICD-10-CM | POA: Diagnosis not present

## 2019-05-06 DIAGNOSIS — I5043 Acute on chronic combined systolic (congestive) and diastolic (congestive) heart failure: Secondary | ICD-10-CM | POA: Diagnosis not present

## 2019-05-06 DIAGNOSIS — I10 Essential (primary) hypertension: Secondary | ICD-10-CM

## 2019-05-06 DIAGNOSIS — L97222 Non-pressure chronic ulcer of left calf with fat layer exposed: Secondary | ICD-10-CM | POA: Diagnosis not present

## 2019-05-06 DIAGNOSIS — M7989 Other specified soft tissue disorders: Secondary | ICD-10-CM

## 2019-05-06 DIAGNOSIS — L97221 Non-pressure chronic ulcer of left calf limited to breakdown of skin: Secondary | ICD-10-CM | POA: Insufficient documentation

## 2019-05-06 NOTE — Assessment & Plan Note (Signed)
Chronic heart and pulmonary issues would certainly worsen lower extremity swelling

## 2019-05-06 NOTE — Patient Instructions (Signed)

## 2019-05-06 NOTE — Assessment & Plan Note (Signed)
blood pressure control important in reducing the progression of atherosclerotic disease. On appropriate oral medications.  

## 2019-05-06 NOTE — Progress Notes (Signed)
Patient ID: Jeremy Herrera, male   DOB: Oct 15, 1940, 79 y.o.   MRN: [id number]  Chief Complaint  Patient presents with  . New Patient (Initial Visit)    ref Jeremy Herrera ref leg swelling    HPI Jeremy Herrera is a 79 y.o. male.  I am asked to see the patient by Dr. Raul Herrera for evaluation of lower extremity swelling and ulceration.  He has had issues with swelling in both legs for years, but the left leg has had recurrent cellulitis and ulceration over the past couple of months.  It is very painful.  He has had repeated treatments with antibiotics.  He has multiple ulcerations throughout the left calf and ankle area as well as one on the foot.  No fevers or chills or signs of systemic infection.  There is no clear trauma, injury, or inciting event that started the symptoms.  The right leg has persistent swelling but it does not have any open ulcerations and is not currently that painful.  He had a previous infection in that foot and had a toe amputation by Dr. Vickki Muff in podiatry about 2 years ago.  He has significant number of medical issues including cardiopulmonary disease as listed below.     Past Medical History:  Diagnosis Date  . Anemia   . BPH (benign prostatic hyperplasia)   . CHF (congestive heart failure) ([place])   . Dysphagia   . GERD (gastroesophageal reflux disease)   . HLD (hyperlipidemia)   . HTN (hypertension)   . Irregular heart beat   . Nocturia   . Urinary retention     Past Surgical History:  Procedure Laterality Date  . AMPUTATION TOE Left 05/04/2017   Procedure: AMPUTATION TOE - third left toe;  Surgeon: Samara Deist, DPM;  Location: [facility] ORS;  Service: Podiatry;  Laterality: Left;  . bladder patching  1964  . HIP SURGERY Right 2014  . IRRIGATION AND DEBRIDEMENT FOOT Left 11/14/2016   Procedure: IRRIGATION AND DEBRIDEMENT FOOT;  Surgeon: Samara Deist, DPM;  Location: [facility] ORS;  Service: Podiatry;  Laterality: Left;  . TRANSURETHRAL RESECTION OF PROSTATE        Family History  Problem Relation Age of Onset  . Ulcers Mother        stomach  . GER disease Sister        GERD  . Heart attack Father   . Diabetes Brother   . Depression Brother   . Kidney disease Neg Hx   . Prostate cancer Neg Hx   . Kidney cancer Neg Hx   . Bladder Cancer Neg Hx     Social History   Tobacco Use  . Smoking status: Never Smoker  . Smokeless tobacco: Never Used  Substance Use Topics  . Alcohol use: No  . Drug use: No     Allergies  Allergen Reactions  . Hydrocodone-Acetaminophen Nausea And Vomiting    Current Outpatient Medications  Medication Sig Dispense Refill  . acetaminophen (TYLENOL) 500 MG tablet Take 500 mg by mouth every 6 (six) hours as needed.     Marland Kitchen albuterol (VENTOLIN HFA) 108 (90 Base) MCG/ACT inhaler Inhale 1-2 puffs into the lungs every 4 (four) hours as needed.     Marland Kitchen aspirin 81 MG tablet Take 81 mg by mouth daily.    Marland Kitchen atorvastatin (LIPITOR) 40 MG tablet Take 40 mg by mouth daily at 6 PM.     . bimatoprost (LUMIGAN) 0.01 % SOLN 1 drop nightly    .  clopidogrel (PLAVIX) 75 MG tablet Take by mouth.    . Cyanocobalamin (B-12) 1000 MCG/ML KIT Inject 1 mL as directed every 30 (thirty) days.    . cyclobenzaprine (FLEXERIL) 10 MG tablet Take 1 tablet (10 mg total) by mouth 3 (three) times daily as needed for muscle spasms. 20 tablet 0  . donepezil (ARICEPT) 5 MG tablet Take 5 mg by mouth at bedtime.     . dorzolamide-timolol (COSOPT) 22.3-6.8 MG/ML ophthalmic solution Place 1 drop into both eyes 2 (two) times daily.     Marland Kitchen EMOLLIENT EX Apply topically daily.     Marland Kitchen esomeprazole (NEXIUM) 20 MG capsule Take 20 mg by mouth daily at 12 noon.    . ferrous sulfate 325 (65 FE) MG tablet Take 325 mg by mouth daily.    . Fluticasone-Salmeterol (ADVAIR) 250-50 MCG/DOSE AEPB Inhale 1 puff into the lungs as needed.     . furosemide (LASIX) 20 MG tablet Take 20 mg by mouth daily.     Marland Kitchen ketoconazole (NIZORAL) 2 % cream Apply 1 application topically  daily as needed.     Marland Kitchen lisinopril (ZESTRIL) 2.5 MG tablet Take 2.5 mg by mouth daily.     . metoprolol succinate (TOPROL-XL) 50 MG 24 hr tablet Take 50 mg by mouth daily.     . nitroGLYCERIN (NITROSTAT) 0.4 MG SL tablet Place 0.4 mg under the tongue every 5 (five) minutes as needed.     . potassium chloride (K-DUR) 10 MEQ tablet Take 10 mEq by mouth daily.     . sertraline (ZOLOFT) 25 MG tablet Take 25 mg by mouth daily.     . simethicone (MYLICON) 932 MG chewable tablet Chew 125 mg by mouth every 6 (six) hours as needed.     . umeclidinium-vilanterol (ANORO ELLIPTA) 62.5-25 MCG/INH AEPB Inhale 1 puff into the lungs daily as needed.      No current facility-administered medications for this visit.      REVIEW OF SYSTEMS (Negative unless checked)  Constitutional: [] Weight loss  [] Fever  [] Chills Cardiac: [] Chest pain   [] Chest pressure   [] Palpitations   [] Shortness of breath when laying flat   [x] Shortness of breath at rest   [x] Shortness of breath with exertion. Vascular:  [x] Pain in legs with walking   [x] Pain in legs at rest   [] Pain in legs when laying flat   [] Claudication   [] Pain in feet when walking  [] Pain in feet at rest  [] Pain in feet when laying flat   [] History of DVT   [] Phlebitis   [x] Swelling in legs   [] Varicose veins   [x] Non-healing ulcers Pulmonary:   [] Uses home oxygen   [] Productive cough   [] Hemoptysis   [] Wheeze  [] COPD   [] Asthma Neurologic:  [] Dizziness  [] Blackouts   [] Seizures   [] History of stroke   [] History of TIA  [] Aphasia   [] Temporary blindness   [] Dysphagia   [] Weakness or numbness in arms   [] Weakness or numbness in legs Musculoskeletal:  [x] Arthritis   [] Joint swelling   [x] Joint pain   [] Low back pain Hematologic:  [] Easy bruising  [] Easy bleeding   [] Hypercoagulable state   [] Anemic  [] Hepatitis Gastrointestinal:  [] Blood in stool   [] Vomiting blood  [x] Gastroesophageal reflux/heartburn   [] Abdominal pain Genitourinary:  [] Chronic kidney disease    [] Difficult urination  [] Frequent urination  [] Burning with urination   [] Hematuria Skin:  [] Rashes   [x] Ulcers   [x] Wounds Psychological:  [] History of anxiety   []  History of major depression.  Physical Exam BP 111/72 (BP Location: Right Arm)   Pulse 85   Resp 16   Wt 243 lb 6.4 oz (110.4 kg)   BMI 32.11 kg/m  Gen:  WD/WN, NAD Head: [place]/AT, No temporalis wasting.  Ear/Nose/Throat: Hearing grossly intact, nares w/o erythema or drainage, oropharynx w/o Erythema/Exudate Eyes: Conjunctiva clear, sclera non-icteric  Neck: trachea midline.  No JVD.  Pulmonary:  Good air movement, respirations not labored, no use of accessory muscles  Cardiac: Irregular Vascular:  Vessel Right Left  Radial Palpable Palpable                          DP  1+  not palpable  PT  trace  not palpable   Gastrointestinal:. No masses, surgical incisions, or scars. Musculoskeletal: M/S 5/5 throughout.  Extremities without ischemic changes.  No deformity or atrophy.  Diffuse superficial ulcerations on the left calf and ankle throughout the left lower extremity.  He also has a small ulceration on the base of the foot.  The right leg has 1-2+ edema but no open wounds and not a lot of skin changes.  There is pretty significant stasis dermatitis changes present on the left leg with 3+ left lower extremity edema. Neurologic: Sensation grossly intact in extremities.  Symmetrical.  Speech is fluent. Motor exam as listed above. Psychiatric: Judgment intact, Mood & affect appropriate for pt's clinical situation. Dermatologic: Multiple left leg ulcerations as listed above    Radiology No results found.  Labs Recent Results (from the past 2160 hour(s))  B12 level     Status: None   Collection Time: 03/04/19  8:37 AM  Result Value Ref Range   Vitamin B-12 539 180 - 914 pg/mL    Comment: (NOTE) This assay is not validated for testing neonatal or myeloproliferative syndrome specimens for Vitamin B12  levels. Performed at [facility], [place] [address] [place], [place] [address]   Folate     Status: None   Collection Time: 03/04/19  8:37 AM  Result Value Ref Range   Folate 19.9 >5.9 ng/mL    Comment: Performed at [facility], [address] [place], [place] [address]  Iron - ferritin     Status: None   Collection Time: 03/04/19  8:37 AM  Result Value Ref Range   Ferritin 141 24 - 336 ng/mL    Comment: Performed at [facility], [address] [place], [place] [address]  CBC with Differential/Platelet     Status: Abnormal   Collection Time: 03/04/19  8:37 AM  Result Value Ref Range   WBC 5.8 4.0 - 10.5 K/uL   RBC 4.42 4.22 - 5.81 MIL/uL   Hemoglobin 13.9 13.0 - 17.0 g/dL   HCT 40.9 39.0 - 52.0 %   MCV 92.5 80.0 - 100.0 fL   MCH 31.4 26.0 - 34.0 pg   MCHC 34.0 30.0 - 36.0 g/dL   RDW 13.3 11.5 - 15.5 %   Platelets 131 (L) 150 - 400 K/uL   nRBC 0.0 0.0 - 0.2 %   Neutrophils Relative % 58 %   Neutro Abs 3.4 1.7 - 7.7 K/uL   Lymphocytes Relative 28 %   Lymphs Abs 1.6 0.7 - 4.0 K/uL   Monocytes Relative 9 %   Monocytes Absolute 0.5 0.1 - 1.0 K/uL   Eosinophils Relative 4 %   Eosinophils Absolute 0.2 0.0 - 0.5 K/uL   Basophils Relative 1 %   Basophils  Absolute 0.1 0.0 - 0.1 K/uL   Immature Granulocytes 0 %   Abs Immature Granulocytes 0.01 0.00 - 0.07 K/uL    Comment: Performed at [facility], [address], [place], [place] [address]  Comprehensive metabolic panel     Status: Abnormal   Collection Time: 03/04/19  8:37 AM  Result Value Ref Range   Sodium 139 135 - 145 mmol/L   Potassium 4.9 3.5 - 5.1 mmol/L   Chloride 106 98 - 111 mmol/L   CO2 25 22 - 32 mmol/L   Glucose, Bld 118 (H) 70 - 99 mg/dL   BUN 26 (H) 8 - 23 mg/dL   Creatinine, Ser 1.25 (H) 0.61 - 1.24 mg/dL   Calcium 8.8 (L) 8.9 - 10.3 mg/dL   Total Protein 6.9 6.5 - 8.1 g/dL   Albumin 3.6 3.5 - 5.0 g/dL   AST 21 15 - 41 U/L   ALT 17 0 - 44 U/L   Alkaline  Phosphatase 133 (H) 38 - 126 U/L   Total Bilirubin 1.1 0.3 - 1.2 mg/dL   GFR calc non Af Amer 54 (L) >60 mL/min   GFR calc Af Amer >60 >60 mL/min   Anion gap 8 5 - 15    Comment: Performed at [facility], [address] [place], [place] [address]    Assessment/Plan:  Benign essential HTN blood pressure control important in reducing the progression of atherosclerotic disease. On appropriate oral medications.   Diabetes mellitus, type 2 blood glucose control important in reducing the progression of atherosclerotic disease. Also, involved in wound healing. On appropriate medications.   Acute on chronic combined systolic and diastolic heart failure (HCC) Chronic heart and pulmonary issues would certainly worsen lower extremity swelling  Lower limb ulcer, calf, left, with fat layer exposed ([place]) Patient has multiple left lower extremity ulcerations present.  A 3 layer Unna boot will be placed today and should be changed weekly.  I suspect this is going to take weeks or months to get the wounds to heal.  This would also help control the swelling in that leg which is going to be mandatory to keep the wound is healed.  Arterial and venous studies to be performed in the near future at his convenience to assess their adequacy for wound healing as well.  This is a difficult potentially limb threatening situation.  Swelling of limb Patient has significant bilateral lower extremity edema although the left leg is clearly more severely affected the 2 legs.  The patient has open ulcerations and a 3 layer Unna boot was placed on the left leg today.  Once his ulcers have healed, compression stockings would be helpful in both legs to keep the swelling under control.  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  Venous disease will also be assessed  in the near future at his convenience with reflux studies.  We will see him back following his studies and he will get weekly Unna boot changes going forward.      Leotis Pain 05/06/2019, 5:05 PM   This note was created with Dragon medical transcription system.  Any errors from dictation are unintentional.

## 2019-05-06 NOTE — Assessment & Plan Note (Signed)
Patient has significant bilateral lower extremity edema although the left leg is clearly more severely affected the 2 legs.  The patient has open ulcerations and a 3 layer Unna boot was placed on the left leg today.  Once his ulcers have healed, compression stockings would be helpful in both legs to keep the swelling under control.  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  Venous disease will also be assessed in the near future at his convenience with reflux studies.  We will see him back following his studies and he will get weekly Unna boot changes going forward.

## 2019-05-06 NOTE — Assessment & Plan Note (Signed)
Patient has multiple left lower extremity ulcerations present.  A 3 layer Unna boot will be placed today and should be changed weekly.  I suspect this is going to take weeks or months to get the wounds to heal.  This would also help control the swelling in that leg which is going to be mandatory to keep the wound is healed.  Arterial and venous studies to be performed in the near future at his convenience to assess their adequacy for wound healing as well.  This is a difficult potentially limb threatening situation.

## 2019-05-06 NOTE — Assessment & Plan Note (Signed)
blood glucose control important in reducing the progression of atherosclerotic disease. Also, involved in wound healing. On appropriate medications.  

## 2019-05-13 ENCOUNTER — Ambulatory Visit (INDEPENDENT_AMBULATORY_CARE_PROVIDER_SITE_OTHER): Payer: Medicare PPO | Admitting: Nurse Practitioner

## 2019-05-13 ENCOUNTER — Other Ambulatory Visit: Payer: Self-pay

## 2019-05-13 ENCOUNTER — Encounter (INDEPENDENT_AMBULATORY_CARE_PROVIDER_SITE_OTHER): Payer: Self-pay

## 2019-05-13 VITALS — BP 130/95 | HR 117 | Resp 20 | Ht 71.0 in | Wt 241.0 lb

## 2019-05-13 DIAGNOSIS — L97222 Non-pressure chronic ulcer of left calf with fat layer exposed: Secondary | ICD-10-CM | POA: Diagnosis not present

## 2019-05-16 ENCOUNTER — Encounter: Payer: Self-pay | Admitting: Emergency Medicine

## 2019-05-16 ENCOUNTER — Emergency Department: Payer: Medicare PPO

## 2019-05-16 ENCOUNTER — Other Ambulatory Visit: Payer: Self-pay

## 2019-05-16 ENCOUNTER — Inpatient Hospital Stay
Admission: EM | Admit: 2019-05-16 | Discharge: 2019-05-19 | DRG: 291 | Disposition: A | Discharge status: Home Health | Payer: Medicare PPO | Attending: Internal Medicine | Admitting: Internal Medicine

## 2019-05-16 DIAGNOSIS — R1310 Dysphagia, unspecified: Secondary | ICD-10-CM | POA: Diagnosis present

## 2019-05-16 DIAGNOSIS — E1142 Type 2 diabetes mellitus with diabetic polyneuropathy: Secondary | ICD-10-CM | POA: Diagnosis present

## 2019-05-16 DIAGNOSIS — N400 Enlarged prostate without lower urinary tract symptoms: Secondary | ICD-10-CM | POA: Diagnosis present

## 2019-05-16 DIAGNOSIS — I5023 Acute on chronic systolic (congestive) heart failure: Secondary | ICD-10-CM | POA: Diagnosis present

## 2019-05-16 DIAGNOSIS — D696 Thrombocytopenia, unspecified: Secondary | ICD-10-CM | POA: Diagnosis present

## 2019-05-16 DIAGNOSIS — R339 Retention of urine, unspecified: Secondary | ICD-10-CM | POA: Diagnosis present

## 2019-05-16 DIAGNOSIS — S81812D Laceration without foreign body, left lower leg, subsequent encounter: Secondary | ICD-10-CM | POA: Diagnosis not present

## 2019-05-16 DIAGNOSIS — I2510 Atherosclerotic heart disease of native coronary artery without angina pectoris: Secondary | ICD-10-CM | POA: Diagnosis present

## 2019-05-16 DIAGNOSIS — R0602 Shortness of breath: Secondary | ICD-10-CM

## 2019-05-16 DIAGNOSIS — Z833 Family history of diabetes mellitus: Secondary | ICD-10-CM

## 2019-05-16 DIAGNOSIS — Z7951 Long term (current) use of inhaled steroids: Secondary | ICD-10-CM | POA: Diagnosis not present

## 2019-05-16 DIAGNOSIS — I110 Hypertensive heart disease with heart failure: Secondary | ICD-10-CM | POA: Diagnosis present

## 2019-05-16 DIAGNOSIS — K219 Gastro-esophageal reflux disease without esophagitis: Secondary | ICD-10-CM

## 2019-05-16 DIAGNOSIS — J189 Pneumonia, unspecified organism: Secondary | ICD-10-CM

## 2019-05-16 DIAGNOSIS — F329 Major depressive disorder, single episode, unspecified: Secondary | ICD-10-CM | POA: Diagnosis present

## 2019-05-16 DIAGNOSIS — Z79899 Other long term (current) drug therapy: Secondary | ICD-10-CM

## 2019-05-16 DIAGNOSIS — E785 Hyperlipidemia, unspecified: Secondary | ICD-10-CM | POA: Diagnosis present

## 2019-05-16 DIAGNOSIS — I5043 Acute on chronic combined systolic (congestive) and diastolic (congestive) heart failure: Secondary | ICD-10-CM | POA: Diagnosis present

## 2019-05-16 DIAGNOSIS — I872 Venous insufficiency (chronic) (peripheral): Secondary | ICD-10-CM | POA: Diagnosis present

## 2019-05-16 DIAGNOSIS — Z89422 Acquired absence of other left toe(s): Secondary | ICD-10-CM

## 2019-05-16 DIAGNOSIS — Z7982 Long term (current) use of aspirin: Secondary | ICD-10-CM

## 2019-05-16 DIAGNOSIS — Z7902 Long term (current) use of antithrombotics/antiplatelets: Secondary | ICD-10-CM

## 2019-05-16 DIAGNOSIS — I5021 Acute systolic (congestive) heart failure: Secondary | ICD-10-CM | POA: Diagnosis not present

## 2019-05-16 DIAGNOSIS — I4891 Unspecified atrial fibrillation: Secondary | ICD-10-CM | POA: Diagnosis present

## 2019-05-16 DIAGNOSIS — J9601 Acute respiratory failure with hypoxia: Secondary | ICD-10-CM | POA: Diagnosis present

## 2019-05-16 DIAGNOSIS — Z885 Allergy status to narcotic agent status: Secondary | ICD-10-CM

## 2019-05-16 DIAGNOSIS — E538 Deficiency of other specified B group vitamins: Secondary | ICD-10-CM | POA: Diagnosis present

## 2019-05-16 DIAGNOSIS — Z8249 Family history of ischemic heart disease and other diseases of the circulatory system: Secondary | ICD-10-CM

## 2019-05-16 DIAGNOSIS — F32A Depression, unspecified: Secondary | ICD-10-CM | POA: Diagnosis present

## 2019-05-16 DIAGNOSIS — Z20822 Contact with and (suspected) exposure to covid-19: Secondary | ICD-10-CM | POA: Diagnosis present

## 2019-05-16 DIAGNOSIS — Z9079 Acquired absence of other genital organ(s): Secondary | ICD-10-CM

## 2019-05-16 DIAGNOSIS — I10 Essential (primary) hypertension: Secondary | ICD-10-CM | POA: Diagnosis present

## 2019-05-16 LAB — CBC WITH DIFFERENTIAL/PLATELET
Abs Immature Granulocytes: 0.02 10*3/uL (ref 0.00–0.07)
Basophils Absolute: 0 10*3/uL (ref 0.0–0.1)
Basophils Relative: 1 %
Eosinophils Absolute: 0.1 10*3/uL (ref 0.0–0.5)
Eosinophils Relative: 1 %
HCT: 40.6 % (ref 39.0–52.0)
Hemoglobin: 13.6 g/dL (ref 13.0–17.0)
Immature Granulocytes: 0 %
Lymphocytes Relative: 11 %
Lymphs Abs: 0.9 10*3/uL (ref 0.7–4.0)
MCH: 31.8 pg (ref 26.0–34.0)
MCHC: 33.5 g/dL (ref 30.0–36.0)
MCV: 94.9 fL (ref 80.0–100.0)
Monocytes Absolute: 0.5 10*3/uL (ref 0.1–1.0)
Monocytes Relative: 6 %
Neutro Abs: 7 10*3/uL (ref 1.7–7.7)
Neutrophils Relative %: 81 %
Platelets: 128 10*3/uL — ABNORMAL LOW (ref 150–400)
RBC: 4.28 MIL/uL (ref 4.22–5.81)
RDW: 13.7 % (ref 11.5–15.5)
WBC: 8.5 10*3/uL (ref 4.0–10.5)
nRBC: 0 % (ref 0.0–0.2)

## 2019-05-16 LAB — COMPREHENSIVE METABOLIC PANEL
ALT: 12 U/L (ref 0–44)
AST: 17 U/L (ref 15–41)
Albumin: 3.5 g/dL (ref 3.5–5.0)
Alkaline Phosphatase: 134 U/L — ABNORMAL HIGH (ref 38–126)
Anion gap: 8 (ref 5–15)
BUN: 24 mg/dL — ABNORMAL HIGH (ref 8–23)
CO2: 25 mmol/L (ref 22–32)
Calcium: 8.2 mg/dL — ABNORMAL LOW (ref 8.9–10.3)
Chloride: 109 mmol/L (ref 98–111)
Creatinine, Ser: 1.02 mg/dL (ref 0.61–1.24)
GFR calc Af Amer: >60 mL/min (ref >60)
GFR calc non Af Amer: >60 mL/min (ref >60)
Glucose, Bld: 132 mg/dL — ABNORMAL HIGH (ref 70–99)
Potassium: 4.7 mmol/L (ref 3.5–5.1)
Sodium: 142 mmol/L (ref 135–145)
Total Bilirubin: 0.9 mg/dL (ref 0.3–1.2)
Total Protein: 6.7 g/dL (ref 6.5–8.1)

## 2019-05-16 LAB — TROPONIN I (HIGH SENSITIVITY)
Troponin I (High Sensitivity): 12 ng/L (ref <18)
Troponin I (High Sensitivity): 13 ng/L (ref <18)

## 2019-05-16 LAB — APTT: aPTT: 29 seconds (ref 24–36)

## 2019-05-16 LAB — PROCALCITONIN: Procalcitonin: <0.1 ng/mL

## 2019-05-16 LAB — RESPIRATORY PANEL BY RT PCR (FLU A&B, COVID)
Influenza A by PCR: NEGATIVE
Influenza B by PCR: NEGATIVE
SARS Coronavirus 2 by RT PCR: NEGATIVE

## 2019-05-16 LAB — PROTIME-INR
INR: 1 (ref 0.8–1.2)
Prothrombin Time: 13.3 seconds (ref 11.4–15.2)

## 2019-05-16 LAB — BRAIN NATRIURETIC PEPTIDE: B Natriuretic Peptide: 166 pg/mL — ABNORMAL HIGH (ref 0.0–100.0)

## 2019-05-16 LAB — POC SARS CORONAVIRUS 2 AG
SARS Coronavirus 2 Ag: NEGATIVE
SARS Coronavirus 2 Ag: NEGATIVE

## 2019-05-16 MED ORDER — DORZOLAMIDE HCL-TIMOLOL MAL 2-0.5 % OP SOLN
1.0000 [drp] | Freq: Two times a day (BID) | OPHTHALMIC | Status: DC
  Administered 2019-05-16 – 2019-05-19 (×6): 1 [drp] via OPHTHALMIC
  Filled 2019-05-16: qty 10

## 2019-05-16 MED ORDER — DONEPEZIL HCL 5 MG PO TABS
5.0000 mg | ORAL_TABLET | Freq: Every day | ORAL | Status: DC
  Administered 2019-05-16 – 2019-05-18 (×3): 5 mg via ORAL
  Filled 2019-05-16 (×4): qty 1

## 2019-05-16 MED ORDER — METOPROLOL SUCCINATE ER 50 MG PO TB24
150.0000 mg | ORAL_TABLET | Freq: Every day | ORAL | Status: DC
  Administered 2019-05-17: 150 mg via ORAL
  Filled 2019-05-16 (×2): qty 3

## 2019-05-16 MED ORDER — ONDANSETRON HCL 4 MG/2ML IJ SOLN: 4.0000 mg | Freq: Three times a day (TID) | INTRAMUSCULAR | Status: DC | PRN

## 2019-05-16 MED ORDER — NITROGLYCERIN 0.4 MG SL SUBL: 0.4000 mg | SUBLINGUAL_TABLET | SUBLINGUAL | Status: DC | PRN

## 2019-05-16 MED ORDER — SIMETHICONE 80 MG PO CHEW: 80.0000 mg | CHEWABLE_TABLET | Freq: Four times a day (QID) | ORAL | Status: DC | PRN

## 2019-05-16 MED ORDER — ACETAMINOPHEN 500 MG PO TABS
500.0000 mg | ORAL_TABLET | Freq: Four times a day (QID) | ORAL | Status: DC | PRN
  Administered 2019-05-17: 500 mg via ORAL
  Filled 2019-05-16: qty 1

## 2019-05-16 MED ORDER — ENOXAPARIN SODIUM 40 MG/0.4ML ~~LOC~~ SOLN
40.0000 mg | SUBCUTANEOUS | Status: DC
  Administered 2019-05-16 – 2019-05-18 (×3): 40 mg via SUBCUTANEOUS
  Filled 2019-05-16 (×3): qty 0.4

## 2019-05-16 MED ORDER — KETOCONAZOLE 2 % EX CREA
1.0000 "application " | TOPICAL_CREAM | Freq: Every day | CUTANEOUS | Status: DC | PRN
  Filled 2019-05-16: qty 15

## 2019-05-16 MED ORDER — SERTRALINE HCL 50 MG PO TABS
25.0000 mg | ORAL_TABLET | Freq: Every day | ORAL | Status: DC
  Administered 2019-05-16 – 2019-05-19 (×4): 25 mg via ORAL
  Filled 2019-05-16 (×4): qty 1

## 2019-05-16 MED ORDER — SODIUM CHLORIDE 0.9 % IV SOLN
2.0000 g | INTRAVENOUS | Status: DC
  Administered 2019-05-16 – 2019-05-17 (×2): 2 g via INTRAVENOUS
  Filled 2019-05-16: qty 2
  Filled 2019-05-16: qty 20

## 2019-05-16 MED ORDER — CLOPIDOGREL BISULFATE 75 MG PO TABS
75.0000 mg | ORAL_TABLET | Freq: Every day | ORAL | Status: DC
  Administered 2019-05-16 – 2019-05-19 (×4): 75 mg via ORAL
  Filled 2019-05-16 (×4): qty 1

## 2019-05-16 MED ORDER — PANTOPRAZOLE SODIUM 40 MG PO TBEC
40.0000 mg | DELAYED_RELEASE_TABLET | Freq: Every day | ORAL | Status: DC
  Administered 2019-05-16 – 2019-05-19 (×4): 40 mg via ORAL
  Filled 2019-05-16 (×4): qty 1

## 2019-05-16 MED ORDER — METOPROLOL SUCCINATE ER 50 MG PO TB24
50.0000 mg | ORAL_TABLET | Freq: Once | ORAL | Status: AC
  Administered 2019-05-16: 50 mg via ORAL
  Filled 2019-05-16: qty 1

## 2019-05-16 MED ORDER — MOMETASONE FURO-FORMOTEROL FUM 200-5 MCG/ACT IN AERO
2.0000 | INHALATION_SPRAY | Freq: Two times a day (BID) | RESPIRATORY_TRACT | Status: DC
  Administered 2019-05-16 – 2019-05-19 (×6): 2 via RESPIRATORY_TRACT
  Filled 2019-05-16: qty 8.8

## 2019-05-16 MED ORDER — ALBUTEROL SULFATE (2.5 MG/3ML) 0.083% IN NEBU: 2.5000 mg | INHALATION_SOLUTION | RESPIRATORY_TRACT | Status: DC | PRN

## 2019-05-16 MED ORDER — SODIUM CHLORIDE 0.9 % IV SOLN
500.0000 mg | INTRAVENOUS | Status: DC
  Administered 2019-05-16: 500 mg via INTRAVENOUS
  Filled 2019-05-16 (×2): qty 500

## 2019-05-16 MED ORDER — LATANOPROST 0.005 % OP SOLN
1.0000 [drp] | Freq: Every day | OPHTHALMIC | Status: DC
  Administered 2019-05-16 – 2019-05-18 (×3): 1 [drp] via OPHTHALMIC
  Filled 2019-05-16: qty 2.5

## 2019-05-16 MED ORDER — DM-GUAIFENESIN ER 30-600 MG PO TB12
1.0000 | ORAL_TABLET | Freq: Two times a day (BID) | ORAL | Status: DC
  Administered 2019-05-16 – 2019-05-19 (×7): 1 via ORAL
  Filled 2019-05-16 (×9): qty 1

## 2019-05-16 MED ORDER — LISINOPRIL 2.5 MG PO TABS
2.5000 mg | ORAL_TABLET | Freq: Every day | ORAL | Status: DC
  Administered 2019-05-17 – 2019-05-19 (×2): 2.5 mg via ORAL
  Filled 2019-05-16 (×4): qty 1

## 2019-05-16 MED ORDER — FUROSEMIDE 10 MG/ML IJ SOLN
20.0000 mg | Freq: Two times a day (BID) | INTRAMUSCULAR | Status: DC
  Administered 2019-05-17 – 2019-05-19 (×5): 20 mg via INTRAVENOUS
  Filled 2019-05-16 (×5): qty 4

## 2019-05-16 MED ORDER — FERROUS SULFATE 325 (65 FE) MG PO TABS
325.0000 mg | ORAL_TABLET | Freq: Every day | ORAL | Status: DC
  Administered 2019-05-16 – 2019-05-19 (×4): 325 mg via ORAL
  Filled 2019-05-16 (×4): qty 1

## 2019-05-16 MED ORDER — ASPIRIN EC 81 MG PO TBEC
81.0000 mg | DELAYED_RELEASE_TABLET | Freq: Every day | ORAL | Status: DC
  Administered 2019-05-17 – 2019-05-19 (×3): 81 mg via ORAL
  Filled 2019-05-16 (×3): qty 1

## 2019-05-16 MED ORDER — IPRATROPIUM BROMIDE 0.02 % IN SOLN
0.5000 mg | Freq: Four times a day (QID) | RESPIRATORY_TRACT | Status: DC
  Administered 2019-05-16 – 2019-05-17 (×6): 0.5 mg via RESPIRATORY_TRACT
  Filled 2019-05-16 (×6): qty 2.5

## 2019-05-16 MED ORDER — FUROSEMIDE 10 MG/ML IJ SOLN
20.0000 mg | Freq: Once | INTRAMUSCULAR | Status: AC
  Administered 2019-05-16: 20 mg via INTRAVENOUS
  Filled 2019-05-16: qty 4

## 2019-05-16 NOTE — Plan of Care (Signed)
Patient admitted to floor. Patient and daughter oriented to room and call bell.  Telemetry applied. Oxygen at 2L continued. Urinal given.  Patient verbalized understanding, no needs at this time.

## 2019-05-16 NOTE — ED Triage Notes (Signed)
79 yo male from home bib [facility] for chest pain and shortness of breath that started last night, received 324 mg asa by wife pta.  89% on RA initially, brought up to 98% with NRB by first responders.  Now on 2 L via NV patient is 99%.  Lung sounds reported to be tight but clear, tachypnea with accessory muscle use.  Reports not getting enough sleep last night d/t symptoms.  Has been [facility] at [facility] for leg wound since march that is wrapped on arrival.  No reported fevers. Alert and oriented on arrival.

## 2019-05-16 NOTE — H&P (Addendum)
History and Physical    Jeremy Herrera XKP:[id number] DOB: [birth year] DOA: 05/16/2019  Referring MD/NP/PA:   PCP: Jeremy Hartigan, MD   Patient coming from:  The patient is coming from home.  At baseline, pt is independent for most of ADL.        Chief Complaint: SOB  HPI: Jeremy Herrera is a 79 y.o. male with medical history significant of hypertension, hyperlipidemia, diet-controlled diabetes, CAD, CHF with EF 30-35%, GERD, depression, BPH, atrial fibrillation not on anticoagulants, thrombocytopenia, who presents with shortness of breath  Patient has been having shortness of breath in the past several days, which has been progressively worsening.  He has oxygen desaturation to 89% on room air, which improved to 99% on 2 L nasal cannula oxygen.  Patient has mild dry cough, but no fever or chills.  He had some chest discomfort earlier, which has resolved.  No nausea vomiting, diarrhea, abdominal pain, symptoms of UTI or unilateral weakness.  Patient has bilateral leg edema, and several small skin tears in the left lower leg.  ED Course: pt was found to have troponin 12, 13, BNP 166, negative COVID-19 PCR, INR 1.0, PTT 29, WBC 8.5, electrolytes renal function okay, temperature normal, blood pressure 107/81, tachycardia, tachypnea, chest x-ray showed infiltration in right middle lobe.  Patient is admitted to elevate as inpatient.  Review of Systems:   General: no fevers, chills, no body weight gain, has fatigue HEENT: no blurry vision, hearing changes or sore throat Respiratory: has dyspnea, coughing, no wheezing CV: no chest pain, no palpitations GI: no nausea, vomiting, abdominal pain, diarrhea, constipation GU: no dysuria, burning on urination, increased urinary frequency, hematuria  Ext: has leg edema Neuro: no unilateral weakness, numbness, or tingling, no vision change or hearing loss Skin: no rash, no skin tear. MSK: No muscle spasm, no deformity, no limitation of range of  movement in spin Heme: No easy bruising.  Travel history: No recent long distant travel.  Allergy:  Allergies  Allergen Reactions  . Hydrocodone-Acetaminophen Nausea And Vomiting    Past Medical History:  Diagnosis Date  . Anemia   . BPH (benign prostatic hyperplasia)   . CHF (congestive heart failure) ([place])   . Dysphagia   . GERD (gastroesophageal reflux disease)   . HLD (hyperlipidemia)   . HTN (hypertension)   . Irregular heart beat   . Nocturia   . Urinary retention     Past Surgical History:  Procedure Laterality Date  . AMPUTATION TOE Left 05/04/2017   Procedure: AMPUTATION TOE - third left toe;  Surgeon: Samara Deist, DPM;  Location: [facility] ORS;  Service: Podiatry;  Laterality: Left;  . bladder patching  1964  . HIP SURGERY Right 2014  . IRRIGATION AND DEBRIDEMENT FOOT Left 11/14/2016   Procedure: IRRIGATION AND DEBRIDEMENT FOOT;  Surgeon: Samara Deist, DPM;  Location: [facility] ORS;  Service: Podiatry;  Laterality: Left;  . TRANSURETHRAL RESECTION OF PROSTATE      Social History:  reports that he has never smoked. He has never used smokeless tobacco. He reports that he does not drink alcohol or use drugs.  Family History:  Family History  Problem Relation Age of Onset  . Ulcers Mother        stomach  . GER disease Sister        GERD  . Heart attack Father   . Diabetes Brother   . Depression Brother   . Kidney disease Neg Hx   . Prostate cancer Neg Hx   .  Kidney cancer Neg Hx   . Bladder Cancer Neg Hx      Prior to Admission medications   Medication Sig Start Date End Date Taking? Authorizing Provider  acetaminophen (TYLENOL) 500 MG tablet Take 500 mg by mouth every 6 (six) hours as needed.     [provider]  albuterol (VENTOLIN HFA) 108 (90 Base) MCG/ACT inhaler Inhale 1-2 puffs into the lungs every 4 (four) hours as needed.  07/07/18   [provider]  aspirin 81 MG tablet Take 81 mg by mouth daily.    [provider]    atorvastatin (LIPITOR) 40 MG tablet Take 40 mg by mouth daily at 6 PM.  06/29/18   [provider]  bimatoprost (LUMIGAN) 0.01 % SOLN 1 drop nightly    [provider]  clopidogrel (PLAVIX) 75 MG tablet Take by mouth. 11/26/18   [provider]  Cyanocobalamin (B-12) 1000 MCG/ML KIT Inject 1 mL as directed every 30 (thirty) days.    [provider]  cyclobenzaprine (FLEXERIL) 10 MG tablet Take 1 tablet (10 mg total) by mouth 3 (three) times daily as needed for muscle spasms. 05/05/17   Loletha Grayer, MD  donepezil (ARICEPT) 5 MG tablet Take 5 mg by mouth at bedtime.  08/22/17   [provider]  dorzolamide-timolol (COSOPT) 22.3-6.8 MG/ML ophthalmic solution Place 1 drop into both eyes 2 (two) times daily.     [provider]  EMOLLIENT EX Apply topically daily.     [provider]  esomeprazole (NEXIUM) 20 MG capsule Take 20 mg by mouth daily at 12 noon.    [provider]  ferrous sulfate 325 (65 FE) MG tablet Take 325 mg by mouth daily.    [provider]  Fluticasone-Salmeterol (ADVAIR) 250-50 MCG/DOSE AEPB Inhale 1 puff into the lungs as needed.     [provider]  furosemide (LASIX) 20 MG tablet Take 20 mg by mouth daily.  11/23/14   [provider]  ketoconazole (NIZORAL) 2 % cream Apply 1 application topically daily as needed.  08/31/18   [provider]  lisinopril (ZESTRIL) 2.5 MG tablet Take 2.5 mg by mouth daily.  07/07/18   [provider]  metoprolol succinate (TOPROL-XL) 50 MG 24 hr tablet Take 50 mg by mouth daily.  07/07/18   [provider]  nitroGLYCERIN (NITROSTAT) 0.4 MG SL tablet Place 0.4 mg under the tongue every 5 (five) minutes as needed.  06/17/18   [provider]  potassium chloride (K-DUR) 10 MEQ tablet Take 10 mEq by mouth daily.     [provider]  sertraline (ZOLOFT) 25 MG tablet Take 25 mg by mouth daily.  08/31/18   [provider]  simethicone (MYLICON) 675 MG chewable tablet Chew 125 mg by mouth every 6 (six) hours as needed.     [provider]  umeclidinium-vilanterol (ANORO ELLIPTA) 62.5-25 MCG/INH AEPB Inhale 1 puff into the lungs daily as needed.  08/05/18   [provider]    Physical Exam: Vitals:   05/16/19 1330 05/16/19 1430 05/16/19 1500 05/16/19 1530  BP: 118/83 121/85 114/88 121/88  Pulse: 93 76  75  Resp: 19 (!) _0 Temp:      TempSrc:      SpO2: 97% 98%  100%  Weight:      Height:       General: Not in acute distress HEENT:       Eyes: PERRL, EOMI, no  scleral icterus.       ENT: No discharge from the ears and nose, no pharynx injection, no tonsillar enlargement.        Neck: No JVD, no bruit, no mass felt. Heme: No neck lymph node enlargement. Cardiac: S1/S2, RRR, No murmurs, No gallops or rubs. Respiratory: Has fine crackles bilaterally at the base GI: Soft, nondistended, nontender, no rebound pain, no organomegaly, BS present. GU: No hematuria Ext: 2+ pitting leg edema bilaterally. 1+DP/PT pulse bilaterally. Musculoskeletal: No joint deformities, No joint redness or warmth, no limitation of ROM in spin. Skin: No rashes.  Neuro: Alert, oriented X3, cranial nerves II-XII grossly intact, moves all extremities normally. Psych: Patient is not psychotic, no suicidal or hemocidal ideation.  Labs on Admission: I have personally reviewed following labs and imaging studies  CBC: Recent Labs  Lab 05/16/19 0825  WBC 8.5  NEUTROABS 7.0  HGB 13.6  HCT 40.6  MCV 94.9  PLT 338*   Basic Metabolic Panel: Recent Labs  Lab 05/16/19 0825  NA 142  K 4.7  CL 109  CO2 25  GLUCOSE 132*  BUN 24*  CREATININE 1.02  CALCIUM 8.2*   GFR: Estimated Creatinine Clearance: 75.2 mL/min (by C-G formula based on SCr of 1.02 mg/dL). Liver Function Tests: Recent Labs  Lab 05/16/19 0825  AST 17  ALT 12  ALKPHOS 134*  BILITOT 0.9  PROT 6.7  ALBUMIN 3.5   No  results for input(s): LIPASE, AMYLASE in the last 168 hours. No results for input(s): AMMONIA in the last 168 hours. Coagulation Profile: Recent Labs  Lab 05/16/19 0825  INR 1.0   Cardiac Enzymes: No results for input(s): CKTOTAL, CKMB, CKMBINDEX, TROPONINI in the last 168 hours. BNP (last 3 results) No results for input(s): PROBNP in the last 8760 hours. HbA1C: No results for input(s): HGBA1C in the last 72 hours. CBG: No results for input(s): GLUCAP in the last 168 hours. Lipid Profile: No results for input(s): CHOL, HDL, LDLCALC, TRIG, CHOLHDL, LDLDIRECT in the last 72 hours. Thyroid Function Tests: No results for input(s): TSH, T4TOTAL, FREET4, T3FREE, THYROIDAB in the last 72 hours. Anemia Panel: No results for input(s): VITAMINB12, FOLATE, FERRITIN, TIBC, IRON, RETICCTPCT in the last 72 hours. Urine analysis:    Component Value Date/Time   COLORURINE YELLOW (A) 03/31/2018 1203   APPEARANCEUR CLEAR (A) 03/31/2018 1203   APPEARANCEUR Clear 04/02/2013 1415   LABSPEC 1.020 03/31/2018 1203   LABSPEC 1.015 04/02/2013 1415   PHURINE 5.0 03/31/2018 1203   GLUCOSEU NEGATIVE 03/31/2018 1203   GLUCOSEU Negative 04/02/2013 1415   HGBUR MODERATE (A) 03/31/2018 1203   BILIRUBINUR NEGATIVE 03/31/2018 1203   BILIRUBINUR Negative 04/02/2013 1415   KETONESUR NEGATIVE 03/31/2018 1203   PROTEINUR NEGATIVE 03/31/2018 1203   NITRITE NEGATIVE 03/31/2018 1203   LEUKOCYTESUR TRACE (A) 03/31/2018 1203   LEUKOCYTESUR Negative 04/02/2013 1415   Sepsis Labs: _0 (procalcitonin:4,lacticidven:4) ) Recent Results (from the past 240 hour(s))  Respiratory Panel by RT PCR (Flu A&B, Covid) - Nasopharyngeal Swab     Status: None   Collection Time: 05/16/19  9:49 AM   Specimen: Nasopharyngeal Swab  Result Value Ref Range Status   SARS Coronavirus 2 by RT PCR NEGATIVE NEGATIVE Final    Comment: (NOTE) SARS-CoV-2 target nucleic acids are NOT DETECTED. The SARS-CoV-2 RNA is generally  detectable in upper respiratoy specimens during the acute phase of infection. The lowest concentration of SARS-CoV-2 viral copies this assay can detect is 131 copies/mL. A negative result does not preclude  SARS-Cov-2 infection and should not be used as the sole basis for treatment or other patient management decisions. A negative result may occur with  improper specimen collection/handling, submission of specimen other than nasopharyngeal swab, presence of viral mutation(s) within the areas targeted by this assay, and inadequate number of viral copies (<131 copies/mL). A negative result must be combined with clinical observations, patient history, and epidemiological information. The expected result is Negative. Fact Sheet for Patients:  PinkCheek.be Fact Sheet for Healthcare Providers:  GravelBags.it This test is not yet ap proved or cleared by the [place] FDA and  has been authorized for detection and/or diagnosis of SARS-CoV-2 by FDA under an Emergency Use Authorization (EUA). This EUA will remain  in effect (meaning this test can be used) for the duration of the COVID-19 declaration under Section 564(b)(1) of the Act, 21 U.S.C. section 360bbb-3(b)(1), unless the authorization is terminated or revoked sooner.    Influenza A by PCR NEGATIVE NEGATIVE Final   Influenza B by PCR NEGATIVE NEGATIVE Final    Comment: (NOTE) The Xpert Xpress SARS-CoV-2/FLU/RSV assay is intended as an aid in  the diagnosis of influenza from Nasopharyngeal swab specimens and  should not be used as a sole basis for treatment. Nasal washings and  aspirates are unacceptable for Xpert Xpress SARS-CoV-2/FLU/RSV  testing. Fact Sheet for Patients: PinkCheek.be Fact Sheet for Healthcare Providers: GravelBags.it This test is not yet approved or cleared by the [place] FDA and  has been  authorized for detection and/or diagnosis of SARS-CoV-2 by  FDA under an Emergency Use Authorization (EUA). This EUA will remain  in effect (meaning this test can be used) for the duration of the  Covid-19 declaration under Section 564(b)(1) of the Act, 21  U.S.C. section 360bbb-3(b)(1), unless the authorization is  terminated or revoked. Performed at [facility], [address], [place], [place] [address]      Radiological Exams on Admission: DG Chest [facility] 1 View  Result Date: 05/16/2019 CLINICAL DATA:  Shortness of breath EXAM: PORTABLE CHEST 1 VIEW COMPARISON:  03/31/2018 FINDINGS: Cardiac shadow is stable in appearance. Previously seen interstitial changes are again identified. Increased density is noted in the right mid lung without sizable effusion. No bony abnormality is noted. IMPRESSION: Increased patchy opacity within the right mid lung consistent with early infiltrate. Electronically Signed   By: Inez Catalina M.D.   On: 05/16/2019 09:10     EKG: Independently reviewed.  Atrial fibrillation, QTc 470, tachycardia, LAD.  Assessment/Plan Principal Problem:   Acute respiratory failure with hypoxia (HCC) Active Problems:   Thrombocytopenia (HCC)   Benign essential HTN   Acid reflux   CAD (coronary artery disease)   Atrial fibrillation with rapid ventricular response (HCC)   CAP (community acquired pneumonia)   Acute on chronic systolic CHF (congestive heart failure) (HCC)   HLD (hyperlipidemia)   Depression   Acute respiratory failure with hypoxia ([place]): Likely due to CHF exacerbation given elevated BNP, 2+ bilateral leg edema.  Chest x-ray showed right middle lobe infiltration, suspecting pneumonia, but the patient does not have fever or leukocytosis, less likely to have pneumonia though cannot completely rule out this possibility.  -will admit to tele bed as inpt. -Lasix 20 mg bid by IV -2d echo -Daily weights -strict I/O's -Low salt diet -Fluid  restriction  Acute on chronic systolic CHF (congestive heart failure) ([place]): -see above  Questionable CAP (community acquired pneumonia): Chest x-ray showed right middle lobe infiltration, but the patient does not  have fever or leukocytosis. -Rocephin and azithromycin were started in ED, will continue -Blood culture, sputum culture -May discontinue antibiotics if patient continues to have no fever or leukocytosis.  Thrombocytopenia ([place]): this is chronic issue, platelet 128. -f/u CBC  HTN:  -Continue home medications: Lisinopril, metoprolol -Patient is also on Lasix IV -hydralazine prn  Acid reflux -Protonix  CAD (coronary artery disease): Troponin negative -Continue aspirin, Lipitor, metoprolol, as needed nitroglycerin  Atrial fibrillation with rapid ventricular response ([place]): not on AC. HR 124 -->90s  -continue metoprolol  HLD (hyperlipidemia): -lipitor  Depression: -Zoloft  Skin tear in left lower leg: -wound care consult  Inpatient status:  # Patient requires inpatient status due to high intensity of service, high risk for further deterioration and high frequency of surveillance required.  I certify that at the point of admission it is my clinical judgment that the patient will require inpatient hospital care spanning beyond 2 midnights from the point of admission.  . This patient has multiple chronic comorbidities including hypertension, hyperlipidemia, diet-controlled diabetes, CAD, CHF with EF 30-35%, GERD, depression, BPH, atrial fibrillation not on anticoagulants, thrombocytopenia . Now patient has presenting with acute respiratory failure with hypoxia, due to CHF exacerbation. CAP cannot be completely ruled out. . The worrisome physical exam findings include bilateral lower leg edema, fine crackles on auscultation . The initial radiographic and laboratory data are worrisome because of elevated BNP, right middle lobe infiltration on chest x-ray . Current medical  needs: please see my assessment and plan . Predictability of an adverse outcome (risk): Patient has multiple comorbidities as listed above. Now presents with  acute respiratory failure with hypoxia, due to CHF exacerbation. CAP cannot be completely ruled out. Patient's presentation is highly complicated.  Patient is at high risk of deteriorating.  Will need to be treated in hospital for at least 2 days.    DVT ppx:  SQ Lovenox Code Status: Full code Family Communication:   Yes, patient's daughger at bed side Disposition Plan:  Anticipate discharge back to previous home environment Consults called:  none Admission status:  Tele bed  as inpt     Date of Service 05/16/2019    [facility]   If 7PM-7AM, please contact night-coverage [email or web address] 05/16/2019, 4:36 PM

## 2019-05-16 NOTE — Consult Note (Signed)
WOC Nurse Consult Note: Reason for Consult:Patient with chronic lower extremity edema and chronic nonhealing wounds to left lower leg.  Seen by vascular team on 3/16 and compression was initiated for weekly change.  We will change more frequently while in house.  Wound type: venous insufficiency , likely began as trauma.   Pressure Injury NA Wound bed: Ruddy red with surrounding edema and erythema Drainage (amount, consistency, odor)  Periwound:chronic skin changes to lower legs.  Dressing procedure/placement/frequency: Cleanse bilateral lower legs with soap and water and pat dry.  Apply Xeroform gauze to nonintact skin.  Wrap both legs with kerlix from below toes to below knee.  Secure with Self adherent coban.  Change twice weekly. Friday and Tuesday.   Will not follow at this time.  Please re-consult if needed.  Jeremy Hudson MSN, RN, FNP-BC CWON Wound, Ostomy, Continence Nurse Pager [phone]

## 2019-05-16 NOTE — ED Notes (Signed)
Report given to [facility], RN on 2C.

## 2019-05-16 NOTE — ED Provider Notes (Signed)
[facility] Emergency Department Provider Note  ____________________________________________   First MD Initiated Contact with Patient 05/16/19 0820     (approximate)  I have reviewed the triage vital signs and the nursing notes.  History  Chief Complaint Shortness of Breath    HPI Jeremy Herrera is a 79 y.o. male with history of HFrEF (30-35%), HTN, HLD, atrial fibrillation (s/p Watchman, on baby ASA and Plavix), thrombocytopenia, lower extremity ulcers who presents to the ED for SOB and chest pain.  Patient states he has had some mild ongoing shortness of breath for the past few weeks, but this seemed to acutely worsen last night.  He was unable to sleep due to his shortness of breath.  No apparent alleviating or aggravating components.  Symptoms constant since onset.  Denies any fevers or cough.  No sick contacts.  Reports compliance with all of his medications.  Also reports some associated chest discomfort. He has ongoing LE swelling, seen at the [facility] clinic for wound care/wrapping. Took 324 mg ASA PTA.  On EMS arrival to his home he was satting 89% on RA, placed on NRB.  Now on 2 L [place] with oxygen 99%.  He does not normally wear supplemental oxygen.   Past Medical Hx Past Medical History:  Diagnosis Date  . Anemia   . BPH (benign prostatic hyperplasia)   . CHF (congestive heart failure) ([place])   . Dysphagia   . GERD (gastroesophageal reflux disease)   . HLD (hyperlipidemia)   . HTN (hypertension)   . Irregular heart beat   . Nocturia   . Urinary retention     Problem List Patient Active Problem List   Diagnosis Date Noted  . Lower limb ulcer, calf, left, with fat layer exposed ([place]) 05/06/2019  . Swelling of limb 05/06/2019  . CAD (coronary artery disease) 08/07/2018  . Erythema of lower extremity 08/07/2018  . Mild protein-calorie malnutrition ([place]) 06/18/2018  . Acute respiratory distress 06/12/2018  . Dyspnea 06/12/2018  .  Altered mental status 08/19/2017  . Headache 08/19/2017  . Tinnitus 08/19/2017  . 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[place]) 05/02/2017  . Unsteadiness 04/12/2017  . Lung nodule 03/11/2017  . Nausea 12/18/2016  . Diabetic foot ulcer ([place]) 11/13/2016  . Leg pain 05/07/2016  . Acute on chronic combined systolic and diastolic heart failure ([place]) 05/06/2016  . Atrial fibrillation with rapid ventricular response ([place]) 05/06/2016  . High risk medication use 11/23/2015  . Dysphagia 10/02/2015  . Loss of memory 04/29/2015  . 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 Venous insufficiency of both lower extremities 12/17/2014  . Bilateral lower extremity edema 11/23/2014  . Nocturia 10/17/2014  . History of urinary retention 10/17/2014  . 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  . B12 deficiency 09/30/2014  . Right leg swelling 09/30/2014  . Anemia 09/16/2014  . Thrombocytopenia ([place]) 09/16/2014  . Benign essential HTN 06/08/2014  . 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 Beat, premature ventricular 12/03/2013  . Heart valve disease 12/03/2013  . 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 Ventricular premature depolarization 12/03/2013  . 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 Diabetes mellitus, type 2 ([place]) 11/20/2013  . Acid reflux 11/20/2013  . Anemia, iron deficiency 11/20/2013  . Abnormal presence of protein in urine 11/20/2013  . 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 AMPUTATION TOE Left 05/04/2017   Procedure: AMPUTATION  TOE - third left toe;  Surgeon: Samara Deist, DPM;  Location: [facility] ORS;  Service: Podiatry;  Laterality: Left;  . bladder patching  1964  . HIP SURGERY Right 2014  . IRRIGATION AND DEBRIDEMENT FOOT Left 11/14/2016   Procedure: IRRIGATION AND DEBRIDEMENT FOOT;  Surgeon: Samara Deist, DPM;  Location: [facility] ORS;  Service: Podiatry;   Laterality: Left;  . TRANSURETHRAL RESECTION OF PROSTATE      Medications Prior to Admission medications   Medication Sig Start Date End Date Taking? Authorizing Provider  acetaminophen (TYLENOL) 500 MG tablet Take 500 mg by mouth every 6 (six) hours as needed.     [provider]  albuterol (VENTOLIN HFA) 108 (90 Base) MCG/ACT inhaler Inhale 1-2 puffs into the lungs every 4 (four) hours as needed.  07/07/18   [provider]  aspirin 81 MG tablet Take 81 mg by mouth daily.    [provider]  atorvastatin (LIPITOR) 40 MG tablet Take 40 mg by mouth daily at 6 PM.  06/29/18   [provider]  bimatoprost (LUMIGAN) 0.01 % SOLN 1 drop nightly    [provider]  clopidogrel (PLAVIX) 75 MG tablet Take by mouth. 11/26/18   [provider]  Cyanocobalamin (B-12) 1000 MCG/ML KIT Inject 1 mL as directed every 30 (thirty) days.    [provider]  cyclobenzaprine (FLEXERIL) 10 MG tablet Take 1 tablet (10 mg total) by mouth 3 (three) times daily as needed for muscle spasms. 05/05/17   Loletha Grayer, MD  donepezil (ARICEPT) 5 MG tablet Take 5 mg by mouth at bedtime.  08/22/17   [provider]  dorzolamide-timolol (COSOPT) 22.3-6.8 MG/ML ophthalmic solution Place 1 drop into both eyes 2 (two) times daily.     [provider]  EMOLLIENT EX Apply topically daily.     [provider]  esomeprazole (NEXIUM) 20 MG capsule Take 20 mg by mouth daily at 12 noon.    [provider]  ferrous sulfate 325 (65 FE) MG tablet Take 325 mg by mouth daily.    [provider]  Fluticasone-Salmeterol (ADVAIR) 250-50 MCG/DOSE AEPB Inhale 1 puff into the lungs as needed.     [provider]  furosemide (LASIX) 20 MG tablet Take 20 mg by mouth daily.  11/23/14   [provider]  ketoconazole (NIZORAL) 2 % cream Apply 1 application topically daily as needed.  08/31/18   [provider]  lisinopril  (ZESTRIL) 2.5 MG tablet Take 2.5 mg by mouth daily.  07/07/18   [provider]  metoprolol succinate (TOPROL-XL) 50 MG 24 hr tablet Take 50 mg by mouth daily.  07/07/18   [provider]  nitroGLYCERIN (NITROSTAT) 0.4 MG SL tablet Place 0.4 mg under the tongue every 5 (five) minutes as needed.  06/17/18   [provider]  potassium chloride (K-DUR) 10 MEQ tablet Take 10 mEq by mouth daily.     [provider]  sertraline (ZOLOFT) 25 MG tablet Take 25 mg by mouth daily.  08/31/18   [provider]  simethicone (MYLICON) 030 MG chewable tablet Chew 125 mg by mouth every 6 (six) hours as needed.     [provider]  umeclidinium-vilanterol (ANORO ELLIPTA) 62.5-25 MCG/INH AEPB Inhale 1 puff into the lungs daily as needed.  08/05/18   [provider]    Allergies Hydrocodone-acetaminophen  Family Hx Family History  Problem Relation Age of Onset  . Ulcers Mother  stomach  . GER disease Sister        GERD  . Heart attack Father   . Diabetes Brother   . Depression Brother   . Kidney disease Neg Hx   . Prostate cancer Neg Hx   . Kidney cancer Neg Hx   . Bladder Cancer Neg Hx     Social Hx Social History   Tobacco Use  . Smoking status: Never Smoker  . Smokeless tobacco: Never Used  Substance Use Topics  . Alcohol use: No  . Drug use: No     Review of Systems  Constitutional: Negative for fever. Negative for chills. Eyes: Negative for visual changes. ENT: Negative for sore throat. Cardiovascular: Negative for chest pain. Respiratory: + for shortness of breath. Gastrointestinal: Negative for nausea. Negative for vomiting.  Genitourinary: Negative for dysuria. Musculoskeletal: + for leg swelling. Skin: Negative for rash. Neurological: Negative for headaches.   Physical Exam  Vital Signs: ED Triage Vitals  Enc Vitals Group     BP 05/16/19 0843 (!) 155/109     Pulse Rate 05/16/19 0843 (!) 124     Resp  05/16/19 0843 (!) 32     Temp 05/16/19 0843 98.3 F (36.8 C)     Temp Source 05/16/19 0843 Oral     SpO2 05/16/19 0838 (!) 89 %     Weight 05/16/19 0844 243 lb (110.2 kg)     Height 05/16/19 0844 6' 1"  (1.854 m)     Head Circumference --      Peak Flow --      Pain Score --      Pain Loc --      Pain Edu? --      Excl. in [place]? --     Constitutional: Alert and oriented.  Elderly appearing. Head: Normocephalic. Atraumatic. Eyes: Conjunctivae clear, sclera anicteric. Pupils equal and symmetric. Nose: No masses or lesions. No congestion or rhinorrhea. Mouth/Throat: Wearing mask.  Neck: No stridor. Trachea midline.  Cardiovascular: Tachycardic, irregular. Extremities well perfused. Respiratory: Increased respiratory rate.  Decreased lung sounds at the bases, coarse lung sounds on right.  On 2 L nasal cannula. Gastrointestinal: Soft. Non-distended. Non-tender.  Genitourinary: Deferred. Musculoskeletal: Bilateral, symmetric pitting edema of the lower extremities.  LLE wrapped in gauze.  Upon removal, his lower extremity ulcers seem to be well healing.  No acute erythema, warmth, or drainage.  No deformities. Neurologic:  Normal speech and language. No gross focal or lateralizing neurologic deficits are appreciated.  Skin: As above. Psychiatric: Mood and affect are appropriate for situation.  EKG  Personally reviewed and interpreted by myself.   Rate: 128 Rhythm: atrial fibrillation Axis: normal Intervals: QTc 470 ms Atrial fibrillation, RVR, no acute ischemic changes No STEMI    Radiology  CXR  IMPRESSION:  Increased patchy opacity within the right mid lung consistent with  early infiltrate.    Procedures  Procedure(s) performed (including critical care):  Procedures   Initial Impression / Assessment and Plan / MDM / ED Course  79 y.o. male who presents to the ED for shortness of breath, chest discomfort, hypoxia, as above. PMHx notable for  HFrEF (30-35%), HTN,  HLD, atrial fibrillation (s/p Watchman, on baby ASA and Plavix)  Ddx: HF exacerbation, pulmonary infection, PNA, COVID. Consider PE, though less likely given s/p Watchman and compliant on ASA/Plavis.  Will plan for labs, imaging, EKG, supplemental oxygen as needed. Will give his AM dose of metoprolol for rate control and CTM.  Labs notable for  mildly elevated BNP 166. Normal WBC count. Negative troponin. Negative COVID. XR concerning for early right mid lung infiltrate. Will treat with antibiotics and send cultures. HR slowly improving with his PO metoprolol. Discussed w/ hospitalist for admission.     _______________________________   As part of my medical decision making I have reviewed available labs, radiology tests, reviewed old records.  Final Clinical Impression(s) / ED Diagnosis  Final diagnoses:  SOB (shortness of breath)       Note:  This document was prepared using Dragon voice recognition software and may include unintentional dictation errors.   Lilia Pro., MD 05/16/19 1051

## 2019-05-17 ENCOUNTER — Inpatient Hospital Stay (HOSPITAL_COMMUNITY)
Admit: 2019-05-17 | Discharge: 2019-05-17 | Disposition: A | Discharge status: Home / Self Care | Payer: Medicare PPO | Attending: Internal Medicine | Admitting: Internal Medicine

## 2019-05-17 DIAGNOSIS — I5021 Acute systolic (congestive) heart failure: Secondary | ICD-10-CM

## 2019-05-17 LAB — HIV ANTIBODY (ROUTINE TESTING W REFLEX): HIV Screen 4th Generation wRfx: NONREACTIVE

## 2019-05-17 LAB — ECHOCARDIOGRAM COMPLETE
Height: 73 in
Weight: 3888 oz

## 2019-05-17 LAB — STREP PNEUMONIAE URINARY ANTIGEN: Strep Pneumo Urinary Antigen: NEGATIVE

## 2019-05-17 NOTE — Progress Notes (Addendum)
PROGRESS NOTE    [name]  HQI:[id number] DOB: [birth year] DOA: 05/16/2019 PCP: Sofie Hartigan, MD   Brief Narrative:  HPI: Jeremy Herrera is a 79 y.o. male with medical history significant of hypertension, hyperlipidemia, diet-controlled diabetes, CAD, CHF with EF 30-35%, GERD, depression, BPH, atrial fibrillation not on anticoagulants, thrombocytopenia, who presents with shortness of breath  Patient has been having shortness of breath in the past several days, which has been progressively worsening.  He has oxygen desaturation to 89% on room air, which improved to 99% on 2 L nasal cannula oxygen.  Patient has mild dry cough, but no fever or chills.  He had some chest discomfort earlier, which has resolved.  No nausea vomiting, diarrhea, abdominal pain, symptoms of UTI or unilateral weakness.  Patient has bilateral leg edema, and several small skin tears in the left lower leg.  3/27: Patient seen and examined.  Symptomatic improvement.  Weaned down to 1 L supplemental oxygen.  Endorses improvement in symptoms.  Diuresing well over interval.   Assessment & Plan:   Principal Problem:   Acute respiratory failure with hypoxia (HCC) Active Problems:   Thrombocytopenia (HCC)   Benign essential HTN   Acid reflux   CAD (coronary artery disease)   Atrial fibrillation with rapid ventricular response (HCC)   CAP (community acquired pneumonia)   Acute on chronic systolic CHF (congestive heart failure) (HCC)   HLD (hyperlipidemia)   Depression   Acute respiratory failure with hypoxia (HCC) Likely due to CHF exacerbation given elevated BNP, 2+ bilateral leg edema.   Plan: Continue Lasix 20 mg IV twice daily Follow-up 2D echocardiogram Daily weights Strict ins and outs Goal net negative 1-1.5 liters daily Fluid restrict Low-salt diet  Acute on chronic systolic CHF (congestive heart failure) ([place]): -see above  Questionable CAP (community acquired pneumonia),  unlikely Chest x-ray showed right middle lobe infiltration, but the patient does not have fever or leukocytosis. Negative pro calcitonin DC antibiotics Follow vitals and blood cultures  Thrombocytopenia (HCC) this is chronic issue, platelet 128. -f/u CBC  HTN  -Continue home medications: Lisinopril, metoprolol -Patient is also on Lasix IV -hydralazine prn  Acid reflux -Protonix  CAD (coronary artery disease): Troponin negative -Continue aspirin, Lipitor, metoprolol, as needed nitroglycerin  Atrial fibrillation with rapid ventricular response ([place]): not on AC. HR 124 -->90s  -continue metoprolol  HLD (hyperlipidemia): -lipitor  Depression: -Zoloft  Skin tear in left lower leg: -wound care consult   DVT prophylaxis: Lovenox Code Status: Full Family Communication: Left voicemail for daughter Nonie Hoyer [phone] on 05/17/2019 Disposition Plan: Anticipate return to home environment within 48 to 72 hours.  Patient with decompensated heart failure.  Need to continue diuresis, wean from supplemental oxygen, reestablish dry weight   Consultants:   None  Procedures:   2D echo  Antimicrobials:   Rocephin, discontinued  Azithromycin, discontinued   Subjective: Patient seen and examined Endorses symptomatic improvement over interval Diuresing well  Objective: Vitals:   05/16/19 1950 05/16/19 2004 05/17/19 0453 05/17/19 0833  BP: (!) 107/52  (!) 156/95 130/89  Pulse: 80  85   Resp: 16  20   Temp: 98.4 F (36.9 C)  98.1 F (36.7 C)   TempSrc: Oral  Oral   SpO2: 100% 100% 99%   Weight:      Height:        Intake/Output Summary (Last 24 hours) at 05/17/2019 1216 Last data filed at 05/17/2019 1012 Gross per 24 hour  Intake --  Output 2500 ml  Net -2500 ml   Filed Weights   05/16/19 0844  Weight: 110.2 kg    Examination:  General exam: Appears calm and comfortable  Respiratory system: Bibasilar crackles, no wheeze, normal work of  breathing Cardiovascular system: S1 & S2 heard, RRR. No JVD, murmurs, rubs, gallops or clicks.  2+ pitting edema bilaterally, chronic wounds noted on legs Gastrointestinal system: Abdomen is nondistended, soft and nontender. No organomegaly or masses felt. Normal bowel sounds heard. Central nervous system: Alert and oriented. No focal neurological deficits. Extremities: Symmetric 5 x 5 power. Skin: No rashes, lesions or ulcers Psychiatry: Judgement and insight appear normal. Mood & affect appropriate.     Data Reviewed: I have personally reviewed following labs and imaging studies  CBC: Recent Labs  Lab 05/16/19 0825  WBC 8.5  NEUTROABS 7.0  HGB 13.6  HCT 40.6  MCV 94.9  PLT 128*   Basic Metabolic Panel: Recent Labs  Lab 05/16/19 0825  NA 142  K 4.7  CL 109  CO2 25  GLUCOSE 132*  BUN 24*  CREATININE 1.02  CALCIUM 8.2*   GFR: Estimated Creatinine Clearance: 75.2 mL/min (by C-G formula based on SCr of 1.02 mg/dL). Liver Function Tests: Recent Labs  Lab 05/16/19 0825  AST 17  ALT 12  ALKPHOS 134*  BILITOT 0.9  PROT 6.7  ALBUMIN 3.5   No results for input(s): LIPASE, AMYLASE in the last 168 hours. No results for input(s): AMMONIA in the last 168 hours. Coagulation Profile: Recent Labs  Lab 05/16/19 0825  INR 1.0   Cardiac Enzymes: No results for input(s): CKTOTAL, CKMB, CKMBINDEX, TROPONINI in the last 168 hours. BNP (last 3 results) No results for input(s): PROBNP in the last 8760 hours. HbA1C: No results for input(s): HGBA1C in the last 72 hours. CBG: No results for input(s): GLUCAP in the last 168 hours. Lipid Profile: No results for input(s): CHOL, HDL, LDLCALC, TRIG, CHOLHDL, LDLDIRECT in the last 72 hours. Thyroid Function Tests: No results for input(s): TSH, T4TOTAL, FREET4, T3FREE, THYROIDAB in the last 72 hours. Anemia Panel: No results for input(s): VITAMINB12, FOLATE, FERRITIN, TIBC, IRON, RETICCTPCT in the last 72 hours. Sepsis  Labs: Recent Labs  Lab 05/16/19 0825  PROCALCITON <0.10    Recent Results (from the past 240 hour(s))  Blood Culture (routine x 2)     Status: None (Preliminary result)   Collection Time: 05/16/19  8:25 AM   Specimen: BLOOD  Result Value Ref Range Status   Specimen Description BLOOD LF  Final   Special Requests   Final    BOTTLES DRAWN AEROBIC AND ANAEROBIC Blood Culture adequate volume   Culture   Final    NO GROWTH < 24 HOURS Performed at [facility], [address] [place], [place] [address]    Report Status PENDING  Incomplete  Blood Culture (routine x 2)     Status: None (Preliminary result)   Collection Time: 05/16/19  8:25 AM   Specimen: BLOOD  Result Value Ref Range Status   Specimen Description BLOOD RW  Final   Special Requests   Final    BOTTLES DRAWN AEROBIC AND ANAEROBIC Blood Culture results may not be optimal due to an excessive volume of blood received in culture bottles   Culture   Final    NO GROWTH < 24 HOURS Performed at [facility], [address] [place], [place] [address]    Report Status PENDING  Incomplete  Respiratory Panel by RT  PCR (Flu A&B, Covid) - Nasopharyngeal Swab     Status: None   Collection Time: 05/16/19  9:49 AM   Specimen: Nasopharyngeal Swab  Result Value Ref Range Status   SARS Coronavirus 2 by RT PCR NEGATIVE NEGATIVE Final    Comment: (NOTE) SARS-CoV-2 target nucleic acids are NOT DETECTED. The SARS-CoV-2 RNA is generally detectable in upper respiratoy specimens during the acute phase of infection. The lowest concentration of SARS-CoV-2 viral copies this assay can detect is 131 copies/mL. A negative result does not preclude SARS-Cov-2 infection and should not be used as the sole basis for treatment or other patient management decisions. A negative result may occur with  improper specimen collection/handling, submission of specimen other than nasopharyngeal swab, presence of viral mutation(s) within  the areas targeted by this assay, and inadequate number of viral copies (<131 copies/mL). A negative result must be combined with clinical observations, patient history, and epidemiological information. The expected result is Negative. Fact Sheet for Patients:  https://www.moore.com/ Fact Sheet for Healthcare Providers:  https://www.young.biz/ This test is not yet ap proved or cleared by the [place] FDA and  has been authorized for detection and/or diagnosis of SARS-CoV-2 by FDA under an Emergency Use Authorization (EUA). This EUA will remain  in effect (meaning this test can be used) for the duration of the COVID-19 declaration under Section 564(b)(1) of the Act, 21 U.S.C. section 360bbb-3(b)(1), unless the authorization is terminated or revoked sooner.    Influenza A by PCR NEGATIVE NEGATIVE Final   Influenza B by PCR NEGATIVE NEGATIVE Final    Comment: (NOTE) The Xpert Xpress SARS-CoV-2/FLU/RSV assay is intended as an aid in  the diagnosis of influenza from Nasopharyngeal swab specimens and  should not be used as a sole basis for treatment. Nasal washings and  aspirates are unacceptable for Xpert Xpress SARS-CoV-2/FLU/RSV  testing. Fact Sheet for Patients: https://www.moore.com/ Fact Sheet for Healthcare Providers: https://www.young.biz/ This test is not yet approved or cleared by the [place] FDA and  has been authorized for detection and/or diagnosis of SARS-CoV-2 by  FDA under an Emergency Use Authorization (EUA). This EUA will remain  in effect (meaning this test can be used) for the duration of the  Covid-19 declaration under Section 564(b)(1) of the Act, 21  U.S.C. section 360bbb-3(b)(1), unless the authorization is  terminated or revoked. Performed at [facility], [address], [place], [place] [address]          Radiology Studies: [facility] Chest [place] 1 View  Result  Date: 05/16/2019 CLINICAL DATA:  Shortness of breath EXAM: PORTABLE CHEST 1 VIEW COMPARISON:  03/31/2018 FINDINGS: Cardiac shadow is stable in appearance. Previously seen interstitial changes are again identified. Increased density is noted in the right mid lung without sizable effusion. No bony abnormality is noted. IMPRESSION: Increased patchy opacity within the right mid lung consistent with early infiltrate. Electronically Signed   By: Alcide Clever M.D.   On: 05/16/2019 09:10        Scheduled Meds: . aspirin EC  81 mg Oral Daily  . clopidogrel  75 mg Oral Daily  . dextromethorphan-guaiFENesin  1 tablet Oral BID  . donepezil  5 mg Oral QHS  . dorzolamide-timolol  1 drop Both Eyes BID  . enoxaparin (LOVENOX) injection  40 mg Subcutaneous Q24H  . ferrous sulfate  325 mg Oral Daily  . furosemide  20 mg Intravenous Q12H  . ipratropium  0.5 mg Nebulization Q6H  . latanoprost  1 drop Both Eyes  QHS  . lisinopril  2.5 mg Oral Daily  . metoprolol succinate  150 mg Oral Daily  . mometasone-formoterol  2 puff Inhalation BID  . pantoprazole  40 mg Oral Daily  . sertraline  25 mg Oral Daily   Continuous Infusions:   LOS: 1 day    Time spent: 35 minutes    Tresa Moore, MD [facility] Pager [phone]  If 7PM-7AM, please contact night-coverage 05/17/2019, 12:16 PM

## 2019-05-18 LAB — BASIC METABOLIC PANEL
Anion gap: 10 (ref 5–15)
BUN: 26 mg/dL — ABNORMAL HIGH (ref 8–23)
CO2: 26 mmol/L (ref 22–32)
Calcium: 8.7 mg/dL — ABNORMAL LOW (ref 8.9–10.3)
Chloride: 102 mmol/L (ref 98–111)
Creatinine, Ser: 1.29 mg/dL — ABNORMAL HIGH (ref 0.61–1.24)
GFR calc Af Amer: >60 mL/min (ref >60)
GFR calc non Af Amer: 52 mL/min — ABNORMAL LOW (ref >60)
Glucose, Bld: 103 mg/dL — ABNORMAL HIGH (ref 70–99)
Potassium: 3.9 mmol/L (ref 3.5–5.1)
Sodium: 138 mmol/L (ref 135–145)

## 2019-05-18 LAB — MAGNESIUM: Magnesium: 2 mg/dL (ref 1.7–2.4)

## 2019-05-18 MED ORDER — METOPROLOL SUCCINATE ER 50 MG PO TB24
50.0000 mg | ORAL_TABLET | Freq: Every day | ORAL | Status: DC
  Administered 2019-05-18 – 2019-05-19 (×2): 50 mg via ORAL
  Filled 2019-05-18: qty 1

## 2019-05-18 MED ORDER — IPRATROPIUM BROMIDE 0.02 % IN SOLN
0.5000 mg | Freq: Three times a day (TID) | RESPIRATORY_TRACT | Status: DC
  Administered 2019-05-18 – 2019-05-19 (×4): 0.5 mg via RESPIRATORY_TRACT
  Filled 2019-05-18 (×4): qty 2.5

## 2019-05-18 NOTE — Evaluation (Signed)
Physical Therapy Evaluation Patient Details Name: Jeremy Herrera MRN: [id number] DOB: 1940/08/03 Today's Date: 05/18/2019   History of Present Illness  79 yo male with onset of SOB over the last week was admitted for acute respiratory failure with hypoxia, noted infiltrates.  Down to 1L O2, not on supplemental air at home.  PMHx:  HTN, HLD, DM, CAD, CHF with EF 30-35%, GERD, depression, BPH, a-fib, thrombocytopenia, CAP,   Clinical Impression  Pt was seen for mobility and assessment of safety with regards to balance and tolerance with monitoring of sats and pulses.  Had a drop to 89% to walk with fast recovery on room air, pulse up to 115 with fast drop to 90 range.  Pt is fatigued but has been clear he has help to do larger housework and gets around on rollator with seat to rest as needed.  Will need RW to manage safety, but pt may decline it as he is very convinced his rollator is safer.    Follow Up Recommendations Home health PT;Supervision for mobility/OOB    Equipment Recommendations  Rolling walker with 5" wheels(if walker in disrepair)    Recommendations for Other Services       Precautions / Restrictions Precautions Precautions: Fall Precaution Comments: monitor O2 sats  Restrictions Weight Bearing Restrictions: No      Mobility  Bed Mobility Overal bed mobility: Modified Independent             General bed mobility comments: able to use bedrail and get to side of bed  Transfers Overall transfer level: Needs assistance Equipment used: Rolling walker (2 wheeled);1 person hand held assist Transfers: Sit to/from Stand Sit to Stand: Min guard            Ambulation/Gait Ambulation/Gait assistance: Min guard Gait Distance (Feet): 40 Feet Assistive device: Rolling walker (2 wheeled);1 person hand held assist Gait Pattern/deviations: Step-through pattern;Decreased stride length;Wide base of support Gait velocity: reduced Gait velocity interpretation: <1.31  ft/sec, indicative of household ambulator General Gait Details: O2 sat on room air 93% pregait and 89% post but quickly recovered to 94%  Stairs            Wheelchair Mobility    Modified Rankin (Stroke Patients Only)       Balance Overall balance assessment: Needs assistance Sitting-balance support: Feet supported Sitting balance-Leahy Scale: Good     Standing balance support: Bilateral upper extremity supported;During functional activity Standing balance-Leahy Scale: Fair Standing balance comment: less than fair dynamic standing                             Pertinent Vitals/Pain Pain Assessment: No/denies pain    Home Living Family/patient expects to be discharged to:: Private residence Living Arrangements: Alone Available Help at Discharge: Family;Available PRN/intermittently Type of Home: House Home Access: Ramped entrance     Home Layout: One level Home Equipment: Walker - 2 wheels Additional Comments: has been getting sporadic assistance    Prior Function Level of Independence: Independent with assistive device(s)         Comments: able to dress and bathe himself     Hand Dominance   Dominant Hand: Right    Extremity/Trunk Assessment   Upper Extremity Assessment Upper Extremity Assessment: Overall WFL for tasks assessed    Lower Extremity Assessment Lower Extremity Assessment: Overall WFL for tasks assessed    Cervical / Trunk Assessment Cervical / Trunk Assessment: Kyphotic  Communication  Communication: No difficulties  Cognition Arousal/Alertness: Awake/alert Behavior During Therapy: WFL for tasks assessed/performed Overall Cognitive Status: Within Functional Limits for tasks assessed                                 General Comments: pt is clear that the breathing issues are quite new      General Comments General comments (skin integrity, edema, etc.): Pt was able to sit up and removed his O2 to ck  sats, very good with 94% to initiate gait.  After walk was 115 with 89% sat on room air but quickly recovered pulse and sat to pregait levels.    Exercises     Assessment/Plan    PT Assessment Patient needs continued PT services  PT Problem List Decreased range of motion;Decreased activity tolerance;Decreased balance;Decreased mobility;Decreased coordination;Decreased safety awareness;Decreased knowledge of use of DME;Cardiopulmonary status limiting activity       PT Treatment Interventions DME instruction;Gait training;Functional mobility training;Therapeutic activities;Therapeutic exercise;Balance training;Neuromuscular re-education;Patient/family education    PT Goals (Current goals can be found in the Care Plan section)  Acute Rehab PT Goals Patient Stated Goal: to get home and feel better, not SOB PT Goal Formulation: With patient Time For Goal Achievement: 06/01/19 Potential to Achieve Goals: Good    Frequency Min 2X/week   Barriers to discharge Inaccessible home environment;Decreased caregiver support home alone with sporadic help from family    Co-evaluation               AM-PAC PT "6 Clicks" Mobility  Outcome Measure Help needed turning from your back to your side while in a flat bed without using bedrails?: None Help needed moving from lying on your back to sitting on the side of a flat bed without using bedrails?: None Help needed moving to and from a bed to a chair (including a wheelchair)?: A Little Help needed standing up from a chair using your arms (e.g., wheelchair or bedside chair)?: A Little Help needed to walk in hospital room?: A Little Help needed climbing 3-5 steps with a railing? : A Lot 6 Click Score: 19    End of Session Equipment Utilized During Treatment: Gait belt;Oxygen Activity Tolerance: Patient tolerated treatment well;Patient limited by fatigue Patient left: in bed;with call bell/phone within reach;with bed alarm set Nurse  Communication: Mobility status PT Visit Diagnosis: Unsteadiness on feet (R26.81);Other abnormalities of gait and mobility (R26.89)    Time: 6761-9509 PT Time Calculation (min) (ACUTE ONLY): 30 min   Charges:   PT Evaluation $PT Eval Moderate Complexity: 1 Mod PT Treatments $Gait Training: 8-22 mins       Ivar Drape 05/18/2019, 1:52 PM  Samul Dada, PT MS Acute Rehab Dept. Number: [facility] [id number] and [facility] [phone]

## 2019-05-18 NOTE — Progress Notes (Signed)
PROGRESS NOTE    Jeremy Herrera  OYD:[id number] DOB: Jul 17, 1940 DOA: 05/16/2019 PCP: Marina Goodell, MD   Brief Narrative:  HPI: Jeremy Herrera is a 79 y.o. male with medical history significant of hypertension, hyperlipidemia, diet-controlled diabetes, CAD, CHF with EF 30-35%, GERD, depression, BPH, atrial fibrillation not on anticoagulants, thrombocytopenia, who presents with shortness of breath  Patient has been having shortness of breath in the past several days, which has been progressively worsening.  He has oxygen desaturation to 89% on room air, which improved to 99% on 2 L nasal cannula oxygen.  Patient has mild dry cough, but no fever or chills.  He had some chest discomfort earlier, which has resolved.  No nausea vomiting, diarrhea, abdominal pain, symptoms of UTI or unilateral weakness.  Patient has bilateral leg edema, and several small skin tears in the left lower leg.  3/27: Patient seen and examined.  Symptomatic improvement.  Weaned down to 1 L supplemental oxygen.  Endorses improvement in symptoms.  Diuresing well over interval.  3/28: Patient seen and examined.  Continues to improve.  Weaned to 1 L supplemental oxygen.   Assessment & Plan:   Principal Problem:   Acute respiratory failure with hypoxia (HCC) Active Problems:   Thrombocytopenia (HCC)   Benign essential HTN   Acid reflux   CAD (coronary artery disease)   Atrial fibrillation with rapid ventricular response (HCC)   CAP (community acquired pneumonia)   Acute on chronic systolic CHF (congestive heart failure) (HCC)   HLD (hyperlipidemia)   Depression   Acute respiratory failure with hypoxia (HCC) Likely due to CHF exacerbation given elevated BNP, 2+ bilateral leg edema.   EF 55 to 60% with indeterminate diastolic function Improving over interval Plan: Continue Lasix 20 mg IV twice daily Anticipate transition to p.o. Lasix tomorrow Daily weights Strict ins and outs Goal net negative  1-1.5 liters daily Fluid restrict Low-salt diet Patient continues to improve anticipate transition to p.o. Lasix tomorrow  Acute on chronic diastolic CHF (congestive heart failure) (HCC): -see above EF 55 to 60%  Questionable CAP (community acquired pneumonia), unlikely Chest x-ray showed right middle lobe infiltration, but the patient does not have fever or leukocytosis. Negative pro calcitonin DC antibiotics Follow vitals and blood cultures  Thrombocytopenia (HCC) this is chronic issue, platelet 128. -f/u CBC  HTN  -Continue home medications: Lisinopril, metoprolol -Patient is also on Lasix IV -hydralazine prn  Acid reflux -Protonix  CAD (coronary artery disease): Troponin negative -Continue aspirin, Lipitor, metoprolol, as needed nitroglycerin  Atrial fibrillation with rapid ventricular response (HCC): not on AC. HR 124 -->90s  -continue metoprolol  HLD (hyperlipidemia): -lipitor  Depression: -Zoloft  Skin tear in left lower leg: -wound care consult   DVT prophylaxis: Lovenox Code Status: Full Family Communication: Left voicemail for daughter Virgina Evener [phone] on 05/17/2019 Disposition Plan: Anticipate return to previous home environment.  Patient still on 1 L oxygen.  We will continue IV diuresis for additional 1 day attempt to wean entirely from supplemental oxygen.  Physical therapy consult requested.  Ambulate patient.  Current barriers to discharge include persistent hypoxic respiratory failure and fluid overload consistent with acute diastolic CHF exacerbation   Consultants:   None  Procedures:   2D echo  Antimicrobials:   Rocephin, discontinued  Azithromycin, discontinued   Subjective: Patient seen and examined Endorses symptomatic improvement over interval Diuresing well  Objective: Vitals:   05/17/19 0833 05/17/19 2030 05/18/19 0413 05/18/19 0916  BP: 130/89 116/81 126/88 102/73  Pulse:  64 86 93  Resp:  (!) 22  20 20   Temp:  97.8 F (36.6 C) 97.6 F (36.4 C) 98.3 F (36.8 C)  TempSrc:  Oral Oral Oral  SpO2:  100% 99% 98%  Weight:      Height:        Intake/Output Summary (Last 24 hours) at 05/18/2019 1315 Last data filed at 05/18/2019 0930 Gross per 24 hour  Intake 480 ml  Output 2300 ml  Net -1820 ml   Filed Weights   05/16/19 0844  Weight: 110.2 kg    Examination:  General exam: Appears calm and comfortable  Respiratory system: Bibasilar crackles, no wheeze, normal work of breathing Cardiovascular system: S1 & S2 heard, RRR. No JVD, murmurs, rubs, gallops or clicks.  2+ pitting edema bilaterally, chronic wounds noted on legs Gastrointestinal system: Abdomen is nondistended, soft and nontender. No organomegaly or masses felt. Normal bowel sounds heard. Central nervous system: Alert and oriented. No focal neurological deficits. Extremities: Symmetric 5 x 5 power. Skin: No rashes, lesions or ulcers Psychiatry: Judgement and insight appear normal. Mood & affect appropriate.     Data Reviewed: I have personally reviewed following labs and imaging studies  CBC: Recent Labs  Lab 05/16/19 0825  WBC 8.5  NEUTROABS 7.0  HGB 13.6  HCT 40.6  MCV 94.9  PLT 295*   Basic Metabolic Panel: Recent Labs  Lab 05/16/19 0825 05/18/19 0749  NA 142 138  K 4.7 3.9  CL 109 102  CO2 25 26  GLUCOSE 132* 103*  BUN 24* 26*  CREATININE 1.02 1.29*  CALCIUM 8.2* 8.7*  MG  --  2.0   GFR: Estimated Creatinine Clearance: 59.4 mL/min (A) (by C-G formula based on SCr of 1.29 mg/dL (H)). Liver Function Tests: Recent Labs  Lab 05/16/19 0825  AST 17  ALT 12  ALKPHOS 134*  BILITOT 0.9  PROT 6.7  ALBUMIN 3.5   No results for input(s): LIPASE, AMYLASE in the last 168 hours. No results for input(s): AMMONIA in the last 168 hours. Coagulation Profile: Recent Labs  Lab 05/16/19 0825  INR 1.0   Cardiac Enzymes: No results for input(s): CKTOTAL, CKMB, CKMBINDEX, TROPONINI in the last  168 hours. BNP (last 3 results) No results for input(s): PROBNP in the last 8760 hours. HbA1C: No results for input(s): HGBA1C in the last 72 hours. CBG: No results for input(s): GLUCAP in the last 168 hours. Lipid Profile: No results for input(s): CHOL, HDL, LDLCALC, TRIG, CHOLHDL, LDLDIRECT in the last 72 hours. Thyroid Function Tests: No results for input(s): TSH, T4TOTAL, FREET4, T3FREE, THYROIDAB in the last 72 hours. Anemia Panel: No results for input(s): VITAMINB12, FOLATE, FERRITIN, TIBC, IRON, RETICCTPCT in the last 72 hours. Sepsis Labs: Recent Labs  Lab 05/16/19 0825  PROCALCITON <0.10    Recent Results (from the past 240 hour(s))  Blood Culture (routine x 2)     Status: None (Preliminary result)   Collection Time: 05/16/19  8:25 AM   Specimen: BLOOD  Result Value Ref Range Status   Specimen Description BLOOD LF  Final   Special Requests   Final    BOTTLES DRAWN AEROBIC AND ANAEROBIC Blood Culture adequate volume   Culture   Final    NO GROWTH 2 DAYS Performed at [facility], [address] [place], [place] [address]    Report Status PENDING  Incomplete  Blood Culture (routine x 2)     Status: None (Preliminary result)   Collection Time:  05/16/19  8:25 AM   Specimen: BLOOD  Result Value Ref Range Status   Specimen Description BLOOD RW  Final   Special Requests   Final    BOTTLES DRAWN AEROBIC AND ANAEROBIC Blood Culture results may not be optimal due to an excessive volume of blood received in culture bottles   Culture   Final    NO GROWTH 2 DAYS Performed at [facility], [address] [place], [place] [address]    Report Status PENDING  Incomplete  Respiratory Panel by RT PCR (Flu A&B, Covid) - Nasopharyngeal Swab     Status: None   Collection Time: 05/16/19  9:49 AM   Specimen: Nasopharyngeal Swab  Result Value Ref Range Status   SARS Coronavirus 2 by RT PCR NEGATIVE NEGATIVE Final    Comment: (NOTE) SARS-CoV-2 target nucleic  acids are NOT DETECTED. The SARS-CoV-2 RNA is generally detectable in upper respiratoy specimens during the acute phase of infection. The lowest concentration of SARS-CoV-2 viral copies this assay can detect is 131 copies/mL. A negative result does not preclude SARS-Cov-2 infection and should not be used as the sole basis for treatment or other patient management decisions. A negative result may occur with  improper specimen collection/handling, submission of specimen other than nasopharyngeal swab, presence of viral mutation(s) within the areas targeted by this assay, and inadequate number of viral copies (<131 copies/mL). A negative result must be combined with clinical observations, patient history, and epidemiological information. The expected result is Negative. Fact Sheet for Patients:  https://www.moore.com/ Fact Sheet for Healthcare Providers:  https://www.young.biz/ This test is not yet ap proved or cleared by the [place] FDA and  has been authorized for detection and/or diagnosis of SARS-CoV-2 by FDA under an Emergency Use Authorization (EUA). This EUA will remain  in effect (meaning this test can be used) for the duration of the COVID-19 declaration under Section 564(b)(1) of the Act, 21 U.S.C. section 360bbb-3(b)(1), unless the authorization is terminated or revoked sooner.    Influenza A by PCR NEGATIVE NEGATIVE Final   Influenza B by PCR NEGATIVE NEGATIVE Final    Comment: (NOTE) The Xpert Xpress SARS-CoV-2/FLU/RSV assay is intended as an aid in  the diagnosis of influenza from Nasopharyngeal swab specimens and  should not be used as a sole basis for treatment. Nasal washings and  aspirates are unacceptable for Xpert Xpress SARS-CoV-2/FLU/RSV  testing. Fact Sheet for Patients: https://www.moore.com/ Fact Sheet for Healthcare Providers: https://www.young.biz/ This test is not yet  approved or cleared by the [place] FDA and  has been authorized for detection and/or diagnosis of SARS-CoV-2 by  FDA under an Emergency Use Authorization (EUA). This EUA will remain  in effect (meaning this test can be used) for the duration of the  Covid-19 declaration under Section 564(b)(1) of the Act, 21  U.S.C. section 360bbb-3(b)(1), unless the authorization is  terminated or revoked. Performed at [facility], [address], [place], [place] [address]          Radiology Studies: ECHOCARDIOGRAM COMPLETE  Result Date: 05/17/2019    ECHOCARDIOGRAM REPORT   Patient Name:   DAMEAN POFFENBERGER Date of Exam: 05/17/2019 Medical Rec #:  [id number]         Height:       73.0 in Accession #:    [id number]        Weight:       243.0 lb Date of Birth:  August 22, 1940  BSA:          2.337 m Patient Age:    79 years          BP:           156/95 mmHg Patient Gender: M                 HR:           88 bpm. Exam Location:  [facility] Procedure: 2D Echo Indications:     CHF-ACUTE SYSTOLIC 428.21/I50.21  History:         Patient has no prior history of Echocardiogram examinations.                  Risk Factors:Hypertension, Diabetes and Dyslipidemia.  Sonographer:     Johnathan HausenSharika Mucker Referring Phys:  Wynona [id number] XILIN NIU Diagnosing Phys: Julien Nordmannimothy Gollan MD  Sonographer Comments: Technically challenging study due to limited acoustic windows. IMPRESSIONS  1. Left ventricular ejection fraction, by estimation, is 55 to 60%. The left ventricle has normal function. The left ventricle has no regional wall motion abnormalities. There is mild left ventricular hypertrophy. Left ventricular diastolic parameters are indeterminate.  2. Right ventricular systolic function is normal. The right ventricular size is normal. There is mildly elevated pulmonary artery systolic pressure. The estimated right ventricular systolic pressure is 36.6 mmHg.  3. Left atrial size was severely dilated.  4. Mild mitral valve  regurgitation.  5. Mild aortic valve stenosis. FINDINGS  Left Ventricle: Left ventricular ejection fraction, by estimation, is 55 to 60%. The left ventricle has normal function. The left ventricle has no regional wall motion abnormalities. The left ventricular internal cavity size was normal in size. There is  mild left ventricular hypertrophy. Left ventricular diastolic parameters are indeterminate. Right Ventricle: The right ventricular size is normal. No increase in right ventricular wall thickness. Right ventricular systolic function is normal. There is mildly elevated pulmonary artery systolic pressure. The tricuspid regurgitant velocity is 2.81  m/s, and with an assumed right atrial pressure of 5 mmHg, the estimated right ventricular systolic pressure is 36.6 mmHg. Left Atrium: Left atrial size was severely dilated. Right Atrium: Right atrial size was normal in size. Pericardium: There is no evidence of pericardial effusion. Mitral Valve: The mitral valve is normal in structure. Normal mobility of the mitral valve leaflets. Mild mitral valve regurgitation. No evidence of mitral valve stenosis. Tricuspid Valve: The tricuspid valve is normal in structure. Tricuspid valve regurgitation is mild . No evidence of tricuspid stenosis. Aortic Valve: The aortic valve is normal in structure. Aortic valve regurgitation is not visualized. Mild aortic stenosis is present. Aortic valve mean gradient measures 10.0 mmHg. Aortic valve peak gradient measures 15.8 mmHg. Aortic valve area, by VTI measures 1.45 cm. Pulmonic Valve: The pulmonic valve was normal in structure. Pulmonic valve regurgitation is not visualized. No evidence of pulmonic stenosis. Aorta: The aortic root is normal in size and structure. Venous: The inferior vena cava is normal in size with greater than 50% respiratory variability, suggesting right atrial pressure of 3 mmHg. IAS/Shunts: No atrial level shunt detected by color flow Doppler.  LEFT VENTRICLE  PLAX 2D LVIDd:         4.05 cm LVIDs:         2.40 cm LV PW:         1.45 cm LV IVS:        1.45 cm LVOT diam:     2.70 cm LV SV:  56 LV SV Index:   24 LVOT Area:     5.73 cm  LEFT ATRIUM              Index LA diam:        4.80 cm  2.05 cm/m LA Vol (A2C):   146.0 ml 62.47 ml/m LA Vol (A4C):   82.9 ml  35.47 ml/m LA Biplane Vol: 112.0 ml 47.92 ml/m  AORTIC VALVE AV Area (Vmax):    1.40 cm AV Area (Vmean):   1.17 cm AV Area (VTI):     1.45 cm AV Vmax:           198.80 cm/s AV Vmean:          151.200 cm/s AV VTI:            0.385 m AV Peak Grad:      15.8 mmHg AV Mean Grad:      10.0 mmHg LVOT Vmax:         48.50 cm/s LVOT Vmean:        30.900 cm/s LVOT VTI:          0.098 m LVOT/AV VTI ratio: 0.25  AORTA Ao Root diam: 3.10 cm TRICUSPID VALVE TR Peak grad:   31.6 mmHg TR Vmax:        281.00 cm/s  SHUNTS Systemic VTI:  0.10 m Systemic Diam: 2.70 cm Julien Nordmann MD Electronically signed by Julien Nordmann MD Signature Date/Time: 05/17/2019/12:56:25 PM    Final         Scheduled Meds: . aspirin EC  81 mg Oral Daily  . clopidogrel  75 mg Oral Daily  . dextromethorphan-guaiFENesin  1 tablet Oral BID  . donepezil  5 mg Oral QHS  . dorzolamide-timolol  1 drop Both Eyes BID  . enoxaparin (LOVENOX) injection  40 mg Subcutaneous Q24H  . ferrous sulfate  325 mg Oral Daily  . furosemide  20 mg Intravenous Q12H  . ipratropium  0.5 mg Nebulization TID  . latanoprost  1 drop Both Eyes QHS  . lisinopril  2.5 mg Oral Daily  . metoprolol succinate  50 mg Oral Daily  . mometasone-formoterol  2 puff Inhalation BID  . pantoprazole  40 mg Oral Daily  . sertraline  25 mg Oral Daily   Continuous Infusions:   LOS: 2 days    Time spent: 35 minutes    Tresa Moore, MD [facility] Pager [phone]  If 7PM-7AM, please contact night-coverage 05/18/2019, 1:15 PM

## 2019-05-19 LAB — BASIC METABOLIC PANEL
Anion gap: 10 (ref 5–15)
BUN: 33 mg/dL — ABNORMAL HIGH (ref 8–23)
CO2: 24 mmol/L (ref 22–32)
Calcium: 8.7 mg/dL — ABNORMAL LOW (ref 8.9–10.3)
Chloride: 103 mmol/L (ref 98–111)
Creatinine, Ser: 1.38 mg/dL — ABNORMAL HIGH (ref 0.61–1.24)
GFR calc Af Amer: 56 mL/min — ABNORMAL LOW (ref >60)
GFR calc non Af Amer: 48 mL/min — ABNORMAL LOW (ref >60)
Glucose, Bld: 139 mg/dL — ABNORMAL HIGH (ref 70–99)
Potassium: 4 mmol/L (ref 3.5–5.1)
Sodium: 137 mmol/L (ref 135–145)

## 2019-05-19 LAB — LEGIONELLA PNEUMOPHILA SEROGP 1 UR AG: L. pneumophila Serogp 1 Ur Ag: NEGATIVE

## 2019-05-19 NOTE — TOC Initial Note (Signed)
Transition of Care [facility]) - Initial/Assessment Note    Patient Details  Name: Jeremy Herrera MRN: [id number] Date of Birth: 04/14/1940  Transition of Care [facility]) CM/SW Contact:    Candie Chroman, LCSW Phone Number: 05/19/2019, 9:51 AM  Clinical Narrative: CSW met with patient. Daughter at bedside. CSW introduced role and explained that PT recommendations would be discussed. Patient and his daughter agreeable to home health. First preference is [place]. Left voicemail for [facility] representative. PT recommended a rolling walker. Patient stated he has a rollator at home that he gets around fine with. Patient and daughter aware of plan for discharge today. No further concerns. CSW encouraged patient and his daughter to contact CSW as needed. CSW will continue to follow patient and his daughter for support and facilitate return home today.                 Expected Discharge Plan: [place] Barriers to Discharge: Barriers Resolved   Patient Goals and CMS Choice   CMS Medicare.gov Compare Post Acute Care list provided to:: Patient(Daughter at bedside.)    Expected Discharge Plan and Services Expected Discharge Plan: [place] Choice: [place] arrangements for the past 2 months: Single Family Home Expected Discharge Date: 05/19/19                                    Prior Living Arrangements/Services Living arrangements for the past 2 months: Single Family Home Lives with:: Self Patient language and need for interpreter reviewed:: Yes Do you feel safe going back to the place where you live?: Yes      Need for Family Participation in Patient Care: Yes (Comment) Care giver support system in place?: No (comment) Current home services: DME Criminal Activity/Legal Involvement Pertinent to Current Situation/Hospitalization: No - Comment as needed  Activities of Daily Living Home Assistive Devices/Equipment: Gilford Rile  (specify type) ADL Screening (condition at time of admission) Patient's cognitive ability adequate to safely complete daily activities?: Yes Is the patient deaf or have difficulty hearing?: No Does the patient have difficulty seeing, even when wearing glasses/contacts?: No Does the patient have difficulty concentrating, remembering, or making decisions?: No Patient able to express need for assistance with ADLs?: Yes Does the patient have difficulty dressing or bathing?: Yes Independently performs ADLs?: No Communication: Independent Dressing (OT): Needs assistance Is this a change from baseline?: Pre-admission baseline Grooming: Independent Feeding: Independent Bathing: Dependent Is this a change from baseline?: Pre-admission baseline Toileting: Needs assistance Is this a change from baseline?: Pre-admission baseline In/Out Bed: Independent with device (comment) Walks in Home: Independent with device (comment) Does the patient have difficulty walking or climbing stairs?: Yes Weakness of Legs: Both Weakness of Arms/Hands: None  Permission Sought/Granted Permission sought to share information with : Facility Sport and exercise psychologist, Family Supports    Share Information with NAME: Nonie Hoyer  Permission granted to share info w AGENCY: [place] granted to share info w Relationship: Daughter  Permission granted to share info w Contact Information: [phone]  Emotional Assessment Appearance:: Appears stated age Attitude/Demeanor/Rapport: Engaged, Gracious Affect (typically observed): Accepting, Appropriate, Calm, Pleasant Orientation: : Oriented to Self, Oriented to Place, Oriented to  Time, Oriented to Situation Alcohol / Substance Use: Not Applicable Psych Involvement: No (comment)  Admission diagnosis:  SOB (shortness of breath) [R06.02] CAP (community acquired pneumonia) [  J18.9] Patient Active Problem List   Diagnosis Date Noted  . Acute  respiratory failure with hypoxia ([place]) 05/16/2019  . CAP (community acquired pneumonia) 05/16/2019  . Acute on chronic systolic CHF (congestive heart failure) ([place]) 05/16/2019  . HLD (hyperlipidemia) 05/16/2019  . Depression 05/16/2019  . Lower limb ulcer, calf, left, with fat layer exposed ([place]) 05/06/2019  . Swelling of limb 05/06/2019  . CAD (coronary artery disease) 08/07/2018  . Erythema of lower extremity 08/07/2018  . Mild protein-calorie malnutrition ([place]) 06/18/2018  . Acute respiratory distress 06/12/2018  . Dyspnea 06/12/2018  . Altered mental status 08/19/2017  . Headache 08/19/2017  . Tinnitus 08/19/2017  . 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[place]) 05/02/2017  . Unsteadiness 04/12/2017  . Lung nodule 03/11/2017  . Nausea 12/18/2016  . Diabetic foot ulcer ([place]) 11/13/2016  . Leg pain 05/07/2016  . Acute on chronic combined systolic and diastolic heart failure ([place]) 05/06/2016  . Atrial fibrillation with rapid ventricular response ([place]) 05/06/2016  . High risk medication use 11/23/2015  . Dysphagia 10/02/2015  . Loss of memory 04/29/2015  . 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 Venous insufficiency of both lower extremities 12/17/2014  . Bilateral lower extremity edema 11/23/2014  . Nocturia 10/17/2014  . History of urinary retention 10/17/2014  . 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  . B12 deficiency 09/30/2014  . Right leg swelling 09/30/2014  . Anemia 09/16/2014  . Thrombocytopenia ([place]) 09/16/2014  . Benign essential HTN 06/08/2014  . 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 Beat, premature ventricular 12/03/2013  . Heart valve disease 12/03/2013  . 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 Ventricular premature depolarization 12/03/2013  . 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 Diabetes mellitus, type 2 ([place]) 11/20/2013  . Acid reflux 11/20/2013  . Anemia, iron deficiency 11/20/2013  . Abnormal presence of protein in urine  11/20/2013  . 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 [address] Phone: [phone] Fax: [phone]     Social Determinants of Health (SDOH) Interventions    Readmission Risk Interventions No flowsheet data found.

## 2019-05-19 NOTE — Discharge Summary (Signed)
Physician Discharge Summary  Jeremy Herrera:[id number] DOB: 04-20-1940 DOA: 05/16/2019  PCP: Sofie Hartigan, MD  Admit date: 05/16/2019 Discharge date: 05/19/2019  Admitted From: Home Disposition: Home with home health  Recommendations for Outpatient Follow-up:  1. Follow up with PCP in 1-2 weeks 2.   Home Health: Yes Equipment/Devices: No Discharge Condition: Stable CODE STATUS: Full Diet recommendation: Heart Healthy  Brief/Interim Summary: WPY:KDXIPJA L Summersis a 79 y.o.malewith medical history significant ofhypertension, hyperlipidemia, diet-controlled diabetes, CAD, CHF with EF 30-35%, GERD, depression, BPH, atrial fibrillation not on anticoagulants, thrombocytopenia, who presents with shortness of breath  Patient has been having shortness of breath in the past several days, which has been progressively worsening. He has oxygen desaturation to 89% on room air, which improved to 99% on 2 L nasal cannula oxygen. Patient has mild dry cough, but no fever or chills. He had some chest discomfort earlier, which has resolved. No nausea vomiting, diarrhea, abdominal pain, symptoms of UTI or unilateral weakness. Patient has bilateral leg edema, and several small skin tears in the left lower leg.  3/27: Patient seen and examined.  Symptomatic improvement.  Weaned down to 1 L supplemental oxygen.  Endorses improvement in symptoms.  Diuresing well over interval.  3/28: Patient seen and examined.  Continues to improve.  Weaned to 1 L supplemental oxygen.  3/29: Patient seen and examined on day of discharge.  Weaned from supplemental oxygen.  Returned to euvolemia.  Ambulating without difficulty.  Stable for discharge.  Home health PT ordered.  Discharge Diagnoses:  Principal Problem:   Acute respiratory failure with hypoxia (HCC) Active Problems:   Thrombocytopenia (HCC)   Benign essential HTN   Acid reflux   CAD (coronary artery disease)   Atrial fibrillation with  rapid ventricular response (HCC)   CAP (community acquired pneumonia)   Acute on chronic systolic CHF (congestive heart failure) (HCC)   HLD (hyperlipidemia)   Depression Acute respiratory failure with hypoxia ([place]), resolved Likely due to CHF exacerbation given elevated BNP, 2+ bilateral leg edema. EF 55 to 60% with indeterminate diastolic function Diuresed with IV Lasix in house Weaned from supplemental oxygen Can resume home diuretic regimen on discharge Outpatient cardiology follow-up   Acute on chronic diastolic CHF (congestive heart failure) ([place]): -see above EF 55 to 60%  QuestionableCAP (community acquired pneumonia), unlikely, ruled out  Chest x-ray showed right middle lobe infiltration, but the patient does not have fever or leukocytosis. Negative pro calcitonin DC antibiotics Follow vitals and blood cultures  Thrombocytopenia (HCC) this ischronic issue, platelet 128.   HTN  -Continue home medications:Lisinopril, metoprolol -Patient is also on Lasix IV, converted to oral on discharge -hydralazine prn  Acid reflux -Protonix  CAD (coronary artery disease):Troponin negative -Continue aspirin, Lipitor, metoprolol, as needed nitroglycerin  Atrial fibrillation with rapid ventricular response ([place]): not on AC. HR 124 -->90s  -continuemetoprolol  HLD (hyperlipidemia): -lipitor  Depression: -Zoloft  Skin tear in left lower leg: -wound care consult   Discharge Instructions  Discharge Instructions    Diet - low sodium heart healthy   Complete by: As directed    Increase activity slowly   Complete by: As directed      Allergies as of 05/19/2019      Reactions   Hydrocodone-acetaminophen Nausea And Vomiting      Medication List    TAKE these medications   acetaminophen 500 MG tablet Commonly known as: TYLENOL Take 500 mg by mouth every 6 (six) hours as needed.   albuterol  108 (90 Base) MCG/ACT inhaler Commonly known as: VENTOLIN  HFA Inhale 1-2 puffs into the lungs every 4 (four) hours as needed.   aspirin 81 MG tablet Take 81 mg by mouth daily.   B-12 1000 MCG/ML Kit Inject 1 mL as directed every 30 (thirty) days.   bimatoprost 0.01 % Soln Commonly known as: LUMIGAN 1 drop nightly   clopidogrel 75 MG tablet Commonly known as: PLAVIX Take by mouth.   donepezil 5 MG tablet Commonly known as: ARICEPT Take 5 mg by mouth at bedtime.   dorzolamide-timolol 22.3-6.8 MG/ML ophthalmic solution Commonly known as: COSOPT Place 1 drop into both eyes 2 (two) times daily.   EMOLLIENT EX Apply topically daily.   esomeprazole 20 MG capsule Commonly known as: NEXIUM Take 20 mg by mouth daily at 12 noon.   ferrous sulfate 325 (65 FE) MG tablet Take 325 mg by mouth daily.   Fluticasone-Salmeterol 250-50 MCG/DOSE Aepb Commonly known as: ADVAIR Inhale 1 puff into the lungs as needed.   furosemide 40 MG tablet Commonly known as: LASIX Take 40 mg by mouth daily.   ketoconazole 2 % cream Commonly known as: NIZORAL Apply 1 application topically daily as needed.   lisinopril 2.5 MG tablet Commonly known as: ZESTRIL Take 2.5 mg by mouth daily.   metoprolol succinate 50 MG 24 hr tablet Commonly known as: TOPROL-XL Take 150 mg by mouth daily.   nitroGLYCERIN 0.4 MG SL tablet Commonly known as: NITROSTAT Place 0.4 mg under the tongue every 5 (five) minutes as needed.   potassium chloride 10 MEQ tablet Commonly known as: KLOR-CON Take 10 mEq by mouth daily.   sertraline 25 MG tablet Commonly known as: ZOLOFT Take 25 mg by mouth daily.   simethicone 125 MG chewable tablet Commonly known as: MYLICON Chew 915 mg by mouth every 6 (six) hours as needed.   umeclidinium-vilanterol 62.5-25 MCG/INH Aepb Commonly known as: ANORO ELLIPTA Inhale 1 puff into the lungs daily as needed.      Follow-up Information    [facility] Follow up on 05/27/2019.   Specialty:  Cardiology Why: at 1:00pm. Entr through the [place] entrance Contact information: [address] [address] [place] [phone]       Sofie Hartigan, MD. Go on 05/26/2019.   Specialty: Family Medicine Why: 2:30pm Contact information: [place] [address] [phone]        [facility] Follow up.   Specialty: Home Health Services Why: They will follow up with you for your home health physical therapy needs. Contact information: [place] [address] [phone]          Allergies  Allergen Reactions  . Hydrocodone-Acetaminophen Nausea And Vomiting    Consultations:  none   Procedures/Studies: DG Chest Port 1 View  Result Date: 05/16/2019 CLINICAL DATA:  Shortness of breath EXAM: PORTABLE CHEST 1 VIEW COMPARISON:  03/31/2018 FINDINGS: Cardiac shadow is stable in appearance. Previously seen interstitial changes are again identified. Increased density is noted in the right mid lung without sizable effusion. No bony abnormality is noted. IMPRESSION: Increased patchy opacity within the right mid lung consistent with early infiltrate. Electronically Signed   By: Inez Catalina M.D.   On: 05/16/2019 09:10   ECHOCARDIOGRAM COMPLETE  Result Date: 05/17/2019    ECHOCARDIOGRAM REPORT   Patient Name:   KRYSTAL DELDUCA Date of Exam: 05/17/2019 Medical Rec #:  [id number]         Height:       73.0 in Accession #:    [id number]        Weight:       243.0 lb Date of Birth:  Dec 02, 1940          BSA:          2.337 m Patient Age:    4 years          BP:           156/95 mmHg Patient Gender: M                 HR:           88 bpm. Exam Location:  [facility] Procedure: 2D Echo Indications:     CHF-ACUTE SYSTOLIC 629.52/W41.32  History:         Patient has no prior history of Echocardiogram examinations.                  Risk Factors:Hypertension, Diabetes and Dyslipidemia.  Sonographer:     Avanell Shackleton Referring Phys:  Unknown Foley NIU Diagnosing Phys: Ida Rogue MD  Sonographer Comments: Technically challenging study due to limited acoustic windows. IMPRESSIONS  1. Left ventricular ejection fraction, by estimation, is 55 to 60%. The left ventricle has normal function. The left ventricle has no regional wall motion abnormalities. There is mild left ventricular hypertrophy. Left ventricular diastolic parameters are indeterminate.  2. Right ventricular systolic function is normal. The right ventricular size is normal. There is mildly elevated pulmonary artery systolic pressure. The estimated right ventricular systolic pressure is 44.0 mmHg.  3. Left atrial size was severely dilated.  4. Mild mitral valve regurgitation.  5. Mild aortic valve stenosis. FINDINGS  Left Ventricle: Left ventricular ejection fraction, by estimation, is 55 to 60%. The left ventricle has normal function. The left ventricle has no regional wall motion abnormalities. The left ventricular internal cavity size was normal in size. There is  mild left ventricular hypertrophy. Left ventricular diastolic parameters are indeterminate. Right Ventricle: The right ventricular size is normal. No increase in right ventricular wall thickness. Right ventricular systolic function is normal. There is mildly elevated pulmonary artery systolic pressure. The tricuspid regurgitant velocity is 2.81  m/s, and with an assumed right atrial pressure of 5 mmHg, the estimated right ventricular systolic pressure is 10.2 mmHg. Left Atrium: Left atrial size was severely dilated. Right Atrium: Right atrial size was normal in size. Pericardium: There is no evidence of pericardial effusion. Mitral Valve: The mitral valve is normal in structure. Normal mobility of the mitral valve leaflets. Mild mitral valve regurgitation. No evidence of mitral valve stenosis. Tricuspid Valve: The tricuspid valve is normal in structure. Tricuspid valve regurgitation is mild . No  evidence of tricuspid stenosis. Aortic Valve: The aortic valve is normal in structure. Aortic valve regurgitation is not visualized. Mild aortic stenosis is present. Aortic valve mean gradient measures 10.0 mmHg. Aortic valve peak gradient measures 15.8 mmHg. Aortic valve area, by VTI measures 1.45 cm. Pulmonic Valve: The pulmonic valve was normal in structure. Pulmonic valve regurgitation is not visualized. No evidence of pulmonic stenosis. Aorta: The aortic root is normal in size and structure. Venous: The inferior vena cava is normal in size with greater than 50% respiratory variability, suggesting right atrial pressure of 3 mmHg. IAS/Shunts: No atrial level shunt detected by color flow Doppler.  LEFT VENTRICLE PLAX 2D LVIDd:  4.05 cm LVIDs:         2.40 cm LV PW:         1.45 cm LV IVS:        1.45 cm LVOT diam:     2.70 cm LV SV:         56 LV SV Index:   24 LVOT Area:     5.73 cm  LEFT ATRIUM              Index LA diam:        4.80 cm  2.05 cm/m LA Vol (A2C):   146.0 ml 62.47 ml/m LA Vol (A4C):   82.9 ml  35.47 ml/m LA Biplane Vol: 112.0 ml 47.92 ml/m  AORTIC VALVE AV Area (Vmax):    1.40 cm AV Area (Vmean):   1.17 cm AV Area (VTI):     1.45 cm AV Vmax:           198.80 cm/s AV Vmean:          151.200 cm/s AV VTI:            0.385 m AV Peak Grad:      15.8 mmHg AV Mean Grad:      10.0 mmHg LVOT Vmax:         48.50 cm/s LVOT Vmean:        30.900 cm/s LVOT VTI:          0.098 m LVOT/AV VTI ratio: 0.25  AORTA Ao Root diam: 3.10 cm TRICUSPID VALVE TR Peak grad:   31.6 mmHg TR Vmax:        281.00 cm/s  SHUNTS Systemic VTI:  0.10 m Systemic Diam: 2.70 cm Ida Rogue MD Electronically signed by Ida Rogue MD Signature Date/Time: 05/17/2019/12:56:25 PM    Final     (Echo, Carotid, EGD, Colonoscopy, ERCP)    Subjective: Patient seen and examined on the day of discharge Weaned from supplemental oxygen In no distress, stable for discharge  Discharge Exam: Vitals:   05/19/19 0722  05/19/19 0813  BP:  (!) 128/97  Pulse:  81  Resp:    Temp:    SpO2: 93%    Vitals:   05/18/19 2107 05/19/19 0444 05/19/19 0722 05/19/19 0813  BP: 119/82 135/88  (!) 128/97  Pulse: 85 93  81  Resp: 18 20    Temp: 98 F (36.7 C) (!) 97.5 F (36.4 C)    TempSrc: Oral Oral    SpO2: 98% 96% 93%   Weight:      Height:        General: Pt is alert, awake, not in acute distress Cardiovascular: RRR, S1/S2 +, no rubs, no gallops Respiratory: CTA bilaterally, no wheezing, no rhonchi Abdominal: Soft, NT, ND, bowel sounds + Extremities: no edema, no cyanosis    The results of significant diagnostics from this hospitalization (including imaging, microbiology, ancillary and laboratory) are listed below for reference.     Microbiology: Recent Results (from the past 240 hour(s))  Blood Culture (routine x 2)     Status: None (Preliminary result)   Collection Time: 05/16/19  8:25 AM   Specimen: BLOOD  Result Value Ref Range Status   Specimen Description BLOOD LF  Final   Special Requests   Final    BOTTLES DRAWN AEROBIC AND ANAEROBIC Blood Culture adequate volume   Culture   Final    NO GROWTH 3 DAYS Performed at [facility], [address] [place], [place] [address]  Report Status PENDING  Incomplete  Blood Culture (routine x 2)     Status: None (Preliminary result)   Collection Time: 05/16/19  8:25 AM   Specimen: BLOOD  Result Value Ref Range Status   Specimen Description BLOOD RW  Final   Special Requests   Final    BOTTLES DRAWN AEROBIC AND ANAEROBIC Blood Culture results may not be optimal due to an excessive volume of blood received in culture bottles   Culture   Final    NO GROWTH 3 DAYS Performed at [facility], [address], [place], [place] [address]    Report Status PENDING  Incomplete  Respiratory Panel by RT PCR (Flu A&B, Covid) - Nasopharyngeal Swab     Status: None   Collection Time: 05/16/19  9:49 AM   Specimen: Nasopharyngeal Swab   Result Value Ref Range Status   SARS Coronavirus 2 by RT PCR NEGATIVE NEGATIVE Final    Comment: (NOTE) SARS-CoV-2 target nucleic acids are NOT DETECTED. The SARS-CoV-2 RNA is generally detectable in upper respiratoy specimens during the acute phase of infection. The lowest concentration of SARS-CoV-2 viral copies this assay can detect is 131 copies/mL. A negative result does not preclude SARS-Cov-2 infection and should not be used as the sole basis for treatment or other patient management decisions. A negative result may occur with  improper specimen collection/handling, submission of specimen other than nasopharyngeal swab, presence of viral mutation(s) within the areas targeted by this assay, and inadequate number of viral copies (<131 copies/mL). A negative result must be combined with clinical observations, patient history, and epidemiological information. The expected result is Negative. Fact Sheet for Patients:  PinkCheek.be Fact Sheet for Healthcare Providers:  GravelBags.it This test is not yet ap proved or cleared by the [place] FDA and  has been authorized for detection and/or diagnosis of SARS-CoV-2 by FDA under an Emergency Use Authorization (EUA). This EUA will remain  in effect (meaning this test can be used) for the duration of the COVID-19 declaration under Section 564(b)(1) of the Act, 21 U.S.C. section 360bbb-3(b)(1), unless the authorization is terminated or revoked sooner.    Influenza A by PCR NEGATIVE NEGATIVE Final   Influenza B by PCR NEGATIVE NEGATIVE Final    Comment: (NOTE) The Xpert Xpress SARS-CoV-2/FLU/RSV assay is intended as an aid in  the diagnosis of influenza from Nasopharyngeal swab specimens and  should not be used as a sole basis for treatment. Nasal washings and  aspirates are unacceptable for Xpert Xpress SARS-CoV-2/FLU/RSV  testing. Fact Sheet for  Patients: PinkCheek.be Fact Sheet for Healthcare Providers: GravelBags.it This test is not yet approved or cleared by the [place] FDA and  has been authorized for detection and/or diagnosis of SARS-CoV-2 by  FDA under an Emergency Use Authorization (EUA). This EUA will remain  in effect (meaning this test can be used) for the duration of the  Covid-19 declaration under Section 564(b)(1) of the Act, 21  U.S.C. section 360bbb-3(b)(1), unless the authorization is  terminated or revoked. Performed at [facility], [address] [place], [place] [address]      Labs: BNP (last 3 results) Recent Labs    05/16/19 0825  BNP 831.5*   Basic Metabolic Panel: Recent Labs  Lab 05/16/19 0825 05/18/19 0749 05/19/19 0931  NA 142 138 137  K 4.7 3.9 4.0  CL 109 102 103  CO2 _0 GLUCOSE 132* 103* 139*  BUN 24* 26* 33*  CREATININE 1.02 1.29*  1.38*  CALCIUM 8.2* 8.7* 8.7*  MG  --  2.0  --    Liver Function Tests: Recent Labs  Lab 05/16/19 0825  AST 17  ALT 12  ALKPHOS 134*  BILITOT 0.9  PROT 6.7  ALBUMIN 3.5   No results for input(s): LIPASE, AMYLASE in the last 168 hours. No results for input(s): AMMONIA in the last 168 hours. CBC: Recent Labs  Lab 05/16/19 0825  WBC 8.5  NEUTROABS 7.0  HGB 13.6  HCT 40.6  MCV 94.9  PLT 128*   Cardiac Enzymes: No results for input(s): CKTOTAL, CKMB, CKMBINDEX, TROPONINI in the last 168 hours. BNP: Invalid input(s): POCBNP CBG: No results for input(s): GLUCAP in the last 168 hours. D-Dimer No results for input(s): DDIMER in the last 72 hours. Hgb A1c No results for input(s): HGBA1C in the last 72 hours. Lipid Profile No results for input(s): CHOL, HDL, LDLCALC, TRIG, CHOLHDL, LDLDIRECT in the last 72 hours. Thyroid function studies No results for input(s): TSH, T4TOTAL, T3FREE, THYROIDAB in the last 72 hours.  Invalid input(s): FREET3 Anemia work  up No results for input(s): VITAMINB12, FOLATE, FERRITIN, TIBC, IRON, RETICCTPCT in the last 72 hours. Urinalysis    Component Value Date/Time   COLORURINE YELLOW (A) 03/31/2018 1203   APPEARANCEUR CLEAR (A) 03/31/2018 1203   APPEARANCEUR Clear 04/02/2013 1415   LABSPEC 1.020 03/31/2018 1203   LABSPEC 1.015 04/02/2013 1415   PHURINE 5.0 03/31/2018 1203   GLUCOSEU NEGATIVE 03/31/2018 1203   GLUCOSEU Negative 04/02/2013 1415   HGBUR MODERATE (A) 03/31/2018 1203   BILIRUBINUR NEGATIVE 03/31/2018 1203   BILIRUBINUR Negative 04/02/2013 1415   KETONESUR NEGATIVE 03/31/2018 1203   PROTEINUR NEGATIVE 03/31/2018 1203   NITRITE NEGATIVE 03/31/2018 1203   LEUKOCYTESUR TRACE (A) 03/31/2018 1203   LEUKOCYTESUR Negative 04/02/2013 1415   Sepsis Labs Invalid input(s): PROCALCITONIN,  WBC,  LACTICIDVEN Microbiology Recent Results (from the past 240 hour(s))  Blood Culture (routine x 2)     Status: None (Preliminary result)   Collection Time: 05/16/19  8:25 AM   Specimen: BLOOD  Result Value Ref Range Status   Specimen Description BLOOD LF  Final   Special Requests   Final    BOTTLES DRAWN AEROBIC AND ANAEROBIC Blood Culture adequate volume   Culture   Final    NO GROWTH 3 DAYS Performed at [facility], [address] [place], [place] [address]    Report Status PENDING  Incomplete  Blood Culture (routine x 2)     Status: None (Preliminary result)   Collection Time: 05/16/19  8:25 AM   Specimen: BLOOD  Result Value Ref Range Status   Specimen Description BLOOD RW  Final   Special Requests   Final    BOTTLES DRAWN AEROBIC AND ANAEROBIC Blood Culture results may not be optimal due to an excessive volume of blood received in culture bottles   Culture   Final    NO GROWTH 3 DAYS Performed at [facility], [address], [place], [place] [address]    Report Status PENDING  Incomplete  Respiratory Panel by RT PCR (Flu A&B, Covid) - Nasopharyngeal Swab     Status: None    Collection Time: 05/16/19  9:49 AM   Specimen: Nasopharyngeal Swab  Result Value Ref Range Status   SARS Coronavirus 2 by RT PCR NEGATIVE NEGATIVE Final    Comment: (NOTE) SARS-CoV-2 target nucleic acids are NOT DETECTED. The SARS-CoV-2 RNA is generally detectable in upper respiratoy specimens during the  acute phase of infection. The lowest concentration of SARS-CoV-2 viral copies this assay can detect is 131 copies/mL. A negative result does not preclude SARS-Cov-2 infection and should not be used as the sole basis for treatment or other patient management decisions. A negative result may occur with  improper specimen collection/handling, submission of specimen other than nasopharyngeal swab, presence of viral mutation(s) within the areas targeted by this assay, and inadequate number of viral copies (<131 copies/mL). A negative result must be combined with clinical observations, patient history, and epidemiological information. The expected result is Negative. Fact Sheet for Patients:  PinkCheek.be Fact Sheet for Healthcare Providers:  GravelBags.it This test is not yet ap proved or cleared by the [place] FDA and  has been authorized for detection and/or diagnosis of SARS-CoV-2 by FDA under an Emergency Use Authorization (EUA). This EUA will remain  in effect (meaning this test can be used) for the duration of the COVID-19 declaration under Section 564(b)(1) of the Act, 21 U.S.C. section 360bbb-3(b)(1), unless the authorization is terminated or revoked sooner.    Influenza A by PCR NEGATIVE NEGATIVE Final   Influenza B by PCR NEGATIVE NEGATIVE Final    Comment: (NOTE) The Xpert Xpress SARS-CoV-2/FLU/RSV assay is intended as an aid in  the diagnosis of influenza from Nasopharyngeal swab specimens and  should not be used as a sole basis for treatment. Nasal washings and  aspirates are unacceptable for Xpert Xpress  SARS-CoV-2/FLU/RSV  testing. Fact Sheet for Patients: PinkCheek.be Fact Sheet for Healthcare Providers: GravelBags.it This test is not yet approved or cleared by the [place] FDA and  has been authorized for detection and/or diagnosis of SARS-CoV-2 by  FDA under an Emergency Use Authorization (EUA). This EUA will remain  in effect (meaning this test can be used) for the duration of the  Covid-19 declaration under Section 564(b)(1) of the Act, 21  U.S.C. section 360bbb-3(b)(1), unless the authorization is  terminated or revoked. Performed at [facility], [address], [place], [place] [address]      Time coordinating discharge: Over 30 minutes  SIGNED:   Sidney Ace, MD  [facility] 05/19/2019, 11:31 AM Pager   If 7PM-7AM, please contact night-coverage

## 2019-05-19 NOTE — TOC Transition Note (Signed)
Transition of Care [facility]) - CM/SW Discharge Note   Patient Details  Name: JASRAJ LAPPE MRN: [id number] Date of Birth: 12-Feb-1941  Transition of Care [facility]) CM/SW Contact:  Margarito Liner, LCSW Phone Number: 05/19/2019, 11:12 AM   Clinical Narrative: Patient has orders to discharge home today. [facility] has accepted patient for HHPT. Patient aware and agreeable. No further concerns. CSW signing off.    Final next level of care: Home w Home Health Services Barriers to Discharge: Barriers Resolved   Patient Goals and CMS Choice   CMS Medicare.gov Compare Post Acute Care list provided to:: Patient(Daughter at bedside.) Choice offered to / list presented to : Patient  Discharge Placement                  Name of family member notified: Virgina Evener Patient and family notified of of transfer: 05/19/19  Discharge Plan and Services     Post Acute Care Choice: Home Health                    HH Arranged: PT [facility] Agency: [facility] Date [facility] Agency Contacted: 05/19/19   Representative spoke with at [facility] Agency: Lorenza Chick  Social Determinants of Health (SDOH) Interventions     Readmission Risk Interventions No flowsheet data found.

## 2019-05-19 NOTE — Progress Notes (Signed)
Annamaria Helling to be D/C'd home with son per MD order.  Discussed prescriptions and follow up appointments with the patient. Prescriptions given to patient, medication list explained in detail. Pt verbalized understanding.  Allergies as of 05/19/2019       Reactions   Hydrocodone-acetaminophen Nausea And Vomiting        Medication List     TAKE these medications    acetaminophen 500 MG tablet Commonly known as: TYLENOL Take 500 mg by mouth every 6 (six) hours as needed.   albuterol 108 (90 Base) MCG/ACT inhaler Commonly known as: VENTOLIN HFA Inhale 1-2 puffs into the lungs every 4 (four) hours as needed.   aspirin 81 MG tablet Take 81 mg by mouth daily.   B-12 1000 MCG/ML Kit Inject 1 mL as directed every 30 (thirty) days.   bimatoprost 0.01 % Soln Commonly known as: LUMIGAN 1 drop nightly   clopidogrel 75 MG tablet Commonly known as: PLAVIX Take by mouth.   donepezil 5 MG tablet Commonly known as: ARICEPT Take 5 mg by mouth at bedtime.   dorzolamide-timolol 22.3-6.8 MG/ML ophthalmic solution Commonly known as: COSOPT Place 1 drop into both eyes 2 (two) times daily.   EMOLLIENT EX Apply topically daily.   esomeprazole 20 MG capsule Commonly known as: NEXIUM Take 20 mg by mouth daily at 12 noon.   ferrous sulfate 325 (65 FE) MG tablet Take 325 mg by mouth daily.   Fluticasone-Salmeterol 250-50 MCG/DOSE Aepb Commonly known as: ADVAIR Inhale 1 puff into the lungs as needed.   furosemide 40 MG tablet Commonly known as: LASIX Take 40 mg by mouth daily.   ketoconazole 2 % cream Commonly known as: NIZORAL Apply 1 application topically daily as needed.   lisinopril 2.5 MG tablet Commonly known as: ZESTRIL Take 2.5 mg by mouth daily.   metoprolol succinate 50 MG 24 hr tablet Commonly known as: TOPROL-XL Take 150 mg by mouth daily.   nitroGLYCERIN 0.4 MG SL tablet Commonly known as: NITROSTAT Place 0.4 mg under the tongue every 5 (five) minutes as  needed.   potassium chloride 10 MEQ tablet Commonly known as: KLOR-CON Take 10 mEq by mouth daily.   sertraline 25 MG tablet Commonly known as: ZOLOFT Take 25 mg by mouth daily.   simethicone 125 MG chewable tablet Commonly known as: MYLICON Chew 884 mg by mouth every 6 (six) hours as needed.   umeclidinium-vilanterol 62.5-25 MCG/INH Aepb Commonly known as: ANORO ELLIPTA Inhale 1 puff into the lungs daily as needed.        Vitals:   05/19/19 0722 05/19/19 0813  BP:  (!) 128/97  Pulse:  81  Resp:    Temp:    SpO2: 93%     Skin clean, dry and intact without evidence of skin break down, no evidence of skin tears noted. IV catheter discontinued intact. Site without signs and symptoms of complications. Dressing and pressure applied. Pt denies pain at this time. No complaints noted.  An After Visit Summary was printed and given to the patient. Patient escorted via [place], and D/C home via private auto.  [place] A Thaniel Coluccio

## 2019-05-19 NOTE — Care Management Important Message (Signed)
Important Message  Patient Details  Name: MUZAMMIL BRUINS MRN: [id number] Date of Birth: Jan 17, 1941   Medicare Important Message Given:  No  Patient discharged prior to arrival to unit to deliver concurrent Medicare IM.    Johnell Comings 05/19/2019, 1:34 PM

## 2019-05-20 ENCOUNTER — Encounter (INDEPENDENT_AMBULATORY_CARE_PROVIDER_SITE_OTHER): Payer: Medicare PPO

## 2019-05-21 LAB — CULTURE, BLOOD (ROUTINE X 2)
Culture: NO GROWTH
Culture: NO GROWTH
Special Requests: ADEQUATE

## 2019-05-27 ENCOUNTER — Ambulatory Visit: Payer: Medicare PPO | Admitting: Family

## 2019-05-27 ENCOUNTER — Ambulatory Visit (INDEPENDENT_AMBULATORY_CARE_PROVIDER_SITE_OTHER): Payer: Medicare PPO | Admitting: Nurse Practitioner

## 2019-05-27 ENCOUNTER — Other Ambulatory Visit: Payer: Self-pay

## 2019-05-27 VITALS — BP 107/70 | HR 66 | Ht 73.0 in | Wt 238.0 lb

## 2019-05-27 DIAGNOSIS — L97222 Non-pressure chronic ulcer of left calf with fat layer exposed: Secondary | ICD-10-CM | POA: Diagnosis not present

## 2019-05-27 NOTE — Progress Notes (Signed)
History of Present Illness  There is no documented history at this time  Assessments & Plan   There are no diagnoses linked to this encounter.    Additional instructions  Subjective:  Patient presents with venous ulcer of the Bilateral lower extremity.    Procedure:  3 layer unna wrap was placed Bilateral lower extremity.   Plan:   Follow up in one week.  

## 2019-05-28 ENCOUNTER — Other Ambulatory Visit: Payer: Medicare PPO

## 2019-05-28 ENCOUNTER — Inpatient Hospital Stay: Payer: Medicare PPO

## 2019-05-28 ENCOUNTER — Inpatient Hospital Stay: Payer: Medicare PPO | Attending: Hematology and Oncology

## 2019-05-28 VITALS — BP 106/74 | HR 68 | Resp 20

## 2019-05-28 DIAGNOSIS — D696 Thrombocytopenia, unspecified: Secondary | ICD-10-CM | POA: Diagnosis present

## 2019-05-28 DIAGNOSIS — E538 Deficiency of other specified B group vitamins: Secondary | ICD-10-CM

## 2019-05-28 LAB — CBC WITH DIFFERENTIAL/PLATELET
Abs Immature Granulocytes: 0.04 10*3/uL (ref 0.00–0.07)
Basophils Absolute: 0.1 10*3/uL (ref 0.0–0.1)
Basophils Relative: 1 %
Eosinophils Absolute: 0.2 10*3/uL (ref 0.0–0.5)
Eosinophils Relative: 3 %
HCT: 37.3 % — ABNORMAL LOW (ref 39.0–52.0)
Hemoglobin: 12.4 g/dL — ABNORMAL LOW (ref 13.0–17.0)
Immature Granulocytes: 1 %
Lymphocytes Relative: 28 %
Lymphs Abs: 1.6 10*3/uL (ref 0.7–4.0)
MCH: 31.2 pg (ref 26.0–34.0)
MCHC: 33.2 g/dL (ref 30.0–36.0)
MCV: 93.7 fL (ref 80.0–100.0)
Monocytes Absolute: 0.4 10*3/uL (ref 0.1–1.0)
Monocytes Relative: 7 %
Neutro Abs: 3.6 10*3/uL (ref 1.7–7.7)
Neutrophils Relative %: 60 %
Platelets: 141 10*3/uL — ABNORMAL LOW (ref 150–400)
RBC: 3.98 MIL/uL — ABNORMAL LOW (ref 4.22–5.81)
RDW: 13.6 % (ref 11.5–15.5)
WBC: 5.9 10*3/uL (ref 4.0–10.5)
nRBC: 0 % (ref 0.0–0.2)

## 2019-05-28 LAB — FERRITIN: Ferritin: 137 ng/mL (ref 24–336)

## 2019-05-28 MED ORDER — CYANOCOBALAMIN 1000 MCG/ML IJ SOLN
1000.0000 ug | Freq: Once | INTRAMUSCULAR | Status: AC
  Administered 2019-05-28: 16:00:00 1000 ug via INTRAMUSCULAR
  Filled 2019-05-28: qty 1

## 2019-05-29 ENCOUNTER — Encounter (INDEPENDENT_AMBULATORY_CARE_PROVIDER_SITE_OTHER): Payer: Self-pay | Admitting: Nurse Practitioner

## 2019-06-03 ENCOUNTER — Other Ambulatory Visit (INDEPENDENT_AMBULATORY_CARE_PROVIDER_SITE_OTHER): Payer: Self-pay | Admitting: Vascular Surgery

## 2019-06-03 DIAGNOSIS — M7989 Other specified soft tissue disorders: Secondary | ICD-10-CM

## 2019-06-03 DIAGNOSIS — L97222 Non-pressure chronic ulcer of left calf with fat layer exposed: Secondary | ICD-10-CM

## 2019-06-04 ENCOUNTER — Ambulatory Visit (INDEPENDENT_AMBULATORY_CARE_PROVIDER_SITE_OTHER): Payer: Medicare PPO

## 2019-06-04 ENCOUNTER — Telehealth (INDEPENDENT_AMBULATORY_CARE_PROVIDER_SITE_OTHER): Payer: Self-pay

## 2019-06-04 ENCOUNTER — Other Ambulatory Visit: Payer: Self-pay

## 2019-06-04 ENCOUNTER — Encounter (INDEPENDENT_AMBULATORY_CARE_PROVIDER_SITE_OTHER): Payer: Self-pay | Admitting: Nurse Practitioner

## 2019-06-04 ENCOUNTER — Ambulatory Visit (INDEPENDENT_AMBULATORY_CARE_PROVIDER_SITE_OTHER): Payer: Medicare PPO | Admitting: Nurse Practitioner

## 2019-06-04 VITALS — BP 111/72 | HR 82 | Ht 73.0 in | Wt 238.0 lb

## 2019-06-04 DIAGNOSIS — I5023 Acute on chronic systolic (congestive) heart failure: Secondary | ICD-10-CM | POA: Diagnosis not present

## 2019-06-04 DIAGNOSIS — M7989 Other specified soft tissue disorders: Secondary | ICD-10-CM

## 2019-06-04 DIAGNOSIS — I890 Lymphedema, not elsewhere classified: Secondary | ICD-10-CM | POA: Diagnosis not present

## 2019-06-04 DIAGNOSIS — L97222 Non-pressure chronic ulcer of left calf with fat layer exposed: Secondary | ICD-10-CM

## 2019-06-04 NOTE — Telephone Encounter (Signed)
I spoke with patient home health nurse on yesterday and she informed that insurance will cover for him to receive unna boot if he has ulcers. The patient was ask today during his follow up to see if he prefer having home health do his unna and he informed that will be fine. Lori the nurse has been giving verbal orders to place bilateral unna boot weekly and she will be starting next week.

## 2019-06-09 ENCOUNTER — Encounter (INDEPENDENT_AMBULATORY_CARE_PROVIDER_SITE_OTHER): Payer: Self-pay | Admitting: Nurse Practitioner

## 2019-06-09 NOTE — Progress Notes (Signed)
Subjective:    Patient ID: Jeremy Herrera, male    DOB: 1940-07-19, 79 y.o.   MRN: [id number] Chief Complaint  Patient presents with  . Follow-up    Unna check BLE ven reflux     Patient returns today for evaluation of lower extremity edema and ulceration.  The patient was previously referred due to swelling in the bilateral lower extremities with multiple episodes of cellulitis as well as some small ulcerations that have been on his lower extremities.  The patient has tolerated the Unna wraps well.  The ulcerations are looking much better.  Patient denies any claudication-like symptoms or rest pain like symptoms.  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  Today the patient underwent noninvasive studies.  The patient has an ABI of 1.17 on the right and 1.25 on the left.  He has triphasic tibial artery waveforms in the bilateral lower extremities.  He also has good toe waveforms bilaterally.  Patient also underwent a bilateral lower extremity venous reflux study which showed no evidence of DVT or superficial venous thrombosis bilaterally.  He has no evidence of chronic venous insufficiency, either in the deep or superficial system, bilaterally.   Review of Systems  Cardiovascular: Positive for leg swelling.  Skin: Positive for wound.  All other systems reviewed and are negative.      Objective:   Physical Exam Vitals reviewed.  Cardiovascular:     Rate and Rhythm: Normal rate and regular rhythm.     Pulses: Normal pulses.  Pulmonary:     Effort: Pulmonary effort is normal.  Musculoskeletal:     Right lower leg: 2+ Pitting Edema present.     Left lower leg: 2+ Pitting Edema present.  Neurological:     Mental Status: He is alert and oriented to person, place, and time.  Psychiatric:        Mood and Affect: Mood normal.        Behavior: Behavior normal.        Thought Content: Thought content normal.        Judgment: Judgment normal.      BP 111/72   Pulse 82   Ht 6' 1"  (1.854 m)   Wt 238 lb (108 kg)   BMI 31.40 kg/m   Past Medical History:  Diagnosis Date  . Anemia   . BPH (benign prostatic hyperplasia)   . CHF (congestive heart failure) ([place])   . Dysphagia   . GERD (gastroesophageal reflux disease)   . HLD (hyperlipidemia)   . HTN (hypertension)   . Irregular heart beat   . Nocturia   . Urinary retention     Social History   Socioeconomic History  . Marital status: Widowed    Spouse name: Not on file  . Number of children: Not on file  . Years of education: Not on file  . Highest education level: Not on file  Occupational History  . Not on file  Tobacco Use  . Smoking status: Never Smoker  . Smokeless tobacco: Never Used  Substance and Sexual Activity  . Alcohol use: No  . Drug use: No  . Sexual activity: Not Currently  Other Topics Concern  . Not on file  Social History Narrative  . Not on file   Social Determinants of Health   Financial Resource Strain:   . Difficulty of Paying Living Expenses:   Food Insecurity:   . Worried About Charity fundraiser in  the Last Year:   . [place] in the Last Year:   Transportation Needs:   . Film/video editor (Medical):   Marland Kitchen Lack of Transportation (Non-Medical):   Physical Activity:   . Days of Exercise per Week:   . Minutes of Exercise per Session:   Stress:   . Feeling of Stress :   Social Connections:   . Frequency of Communication with Friends and Family:   . Frequency of Social Gatherings with Friends and Family:   . Attends Religious Services:   . Active Member of Clubs or Organizations:   . Attends Archivist Meetings:   Marland Kitchen Marital Status:   Intimate Partner Violence:   . Fear of Current or Ex-Partner:   . Emotionally Abused:   Marland Kitchen Physically Abused:   . Sexually Abused:     Past Surgical History:  Procedure Laterality Date  . AMPUTATION TOE Left 05/04/2017   Procedure: AMPUTATION TOE - third left toe;  Surgeon:  Samara Deist, DPM;  Location: [facility] ORS;  Service: Podiatry;  Laterality: Left;  . bladder patching  1964  . HIP SURGERY Right 2014  . IRRIGATION AND DEBRIDEMENT FOOT Left 11/14/2016   Procedure: IRRIGATION AND DEBRIDEMENT FOOT;  Surgeon: Samara Deist, DPM;  Location: [facility] ORS;  Service: Podiatry;  Laterality: Left;  . TRANSURETHRAL RESECTION OF PROSTATE      Family History  Problem Relation Age of Onset  . Ulcers Mother        stomach  . GER disease Sister        GERD  . Heart attack Father   . Diabetes Brother   . Depression Brother   . Kidney disease Neg Hx   . Prostate cancer Neg Hx   . Kidney cancer Neg Hx   . Bladder Cancer Neg Hx     Allergies  Allergen Reactions  . Hydrocodone-Acetaminophen Nausea And Vomiting       Assessment & Plan:    1. Lower limb ulcer, calf, left, with fat layer exposed ([place]) Patient's lower extremities appear to be improving from the bilateral Unna wraps.  We will continue to place the patient in [place] wraps to allow his ulcerations to completely heal as well as to gain better control of his lower extremity edema.  These wraps to be changed on a weekly basis by his home health agency.  We will have the patient return in 4 weeks for evaluation of lower extremity edema.  2. Lymphedema No surgery or intervention at this point in time.    I have reviewed my discussion with the patient regarding venous insufficiency and secondary lymph edema and why it  causes symptoms. I have discussed with the patient the chronic skin changes that accompany these problems and the long term sequela such as ulceration and infection.  Patient will continue wearing graduated compression stockings class 1 (20-30 mmHg) on a daily basis a prescription was given to the patient to keep this updated. The patient will  put the stockings on first thing in the morning and removing them in the evening. The patient is instructed specifically not to sleep in the stockings.  In  addition, behavioral modification including elevation during the day will be continued.  Diet and salt restriction was also discussed.  Previous duplex ultrasound of the lower extremities shows normal deep venous system, superficial reflux was not present.   3. Acute on chronic systolic CHF (congestive heart failure) (HCC) Patient's congestive heart failure could also be  a contributing factor to his lower extremity edema.  Patient has follow-up visit soon to evaluate response to medical therapies.  We will continue to be managed by cardiology.  Current Outpatient Medications on File Prior to Visit  Medication Sig Dispense Refill  . acetaminophen (TYLENOL) 500 MG tablet Take 500 mg by mouth every 6 (six) hours as needed.     Marland Kitchen albuterol (VENTOLIN HFA) 108 (90 Base) MCG/ACT inhaler Inhale 1-2 puffs into the lungs every 4 (four) hours as needed.     Marland Kitchen aspirin 81 MG tablet Take 81 mg by mouth daily.    . bimatoprost (LUMIGAN) 0.01 % SOLN 1 drop nightly    . Cyanocobalamin (B-12) 1000 MCG/ML KIT Inject 1 mL as directed every 30 (thirty) days.    Marland Kitchen donepezil (ARICEPT) 5 MG tablet Take 5 mg by mouth at bedtime.     . dorzolamide-timolol (COSOPT) 22.3-6.8 MG/ML ophthalmic solution Place 1 drop into both eyes 2 (two) times daily.     Marland Kitchen EMOLLIENT EX Apply topically daily.     Marland Kitchen esomeprazole (NEXIUM) 20 MG capsule Take 20 mg by mouth daily at 12 noon.    . ferrous sulfate 325 (65 FE) MG tablet Take 325 mg by mouth daily.    . Fluticasone-Salmeterol (ADVAIR) 250-50 MCG/DOSE AEPB Inhale 1 puff into the lungs as needed.     . furosemide (LASIX) 40 MG tablet Take 40 mg by mouth daily.    Marland Kitchen ketoconazole (NIZORAL) 2 % cream Apply 1 application topically daily as needed.     Marland Kitchen lisinopril (ZESTRIL) 2.5 MG tablet Take 2.5 mg by mouth daily.     . metoprolol succinate (TOPROL-XL) 50 MG 24 hr tablet Take 150 mg by mouth daily.     . nitroGLYCERIN (NITROSTAT) 0.4 MG SL tablet Place 0.4 mg under the tongue every 5  (five) minutes as needed.     . potassium chloride (K-DUR) 10 MEQ tablet Take 10 mEq by mouth daily.     . sertraline (ZOLOFT) 25 MG tablet Take 25 mg by mouth daily.     . simethicone (MYLICON) 916 MG chewable tablet Chew 125 mg by mouth every 6 (six) hours as needed.     . umeclidinium-vilanterol (ANORO ELLIPTA) 62.5-25 MCG/INH AEPB Inhale 1 puff into the lungs daily as needed.     . clopidogrel (PLAVIX) 75 MG tablet Take by mouth.     No current facility-administered medications on file prior to visit.    There are no Patient Instructions on file for this visit. No follow-ups on file.   Kris Hartmann, NP

## 2019-06-10 NOTE — Progress Notes (Signed)
Patient ID: Jeremy Herrera, male    DOB: 1940-03-26, 79 y.o.   MRN: [id number]  HPI Jeremy Herrera is a 79 y/o male with a history of DM, hyperlipidemia, HTN, GERD, anemia, dysphagia and chronic heart failure.   Echo report from 05/17/19 reviewed and showed an EF of 55-60% along with mild LVH, mildly elevated PA pressure, mild Jeremy, mild AS and severe LAE.   Admitted 05/16/19 due to acute on chronic HF. Wound consult obtained. Initially given IV lasix and then transitioned to oral diuretics. Weaned off supplemental oxygen. Antibiotics started but then stopped as no pneumonia. Discharged after 3 days.   He presents today for his initial visit with a chief complaint of minimal shortness of breath upon moderate exertion. He describes this as chronic in nature having been present for several years. He has associated rhinorrhea along with this. He denies any difficulty sleeping, dizziness, abdominal distention, palpitations, pedal edema, chest pain, cough, fatigue or weight gain.   Currently has both lower legs wrapped in UNNA boots and says that they get changed weekly. Has a caregiver present with him.  Past Medical History:  Diagnosis Date  . Anemia   . BPH (benign prostatic hyperplasia)   . CHF (congestive heart failure) ([place])   . Diabetes mellitus without complication ([place])   . Dysphagia   . GERD (gastroesophageal reflux disease)   . HLD (hyperlipidemia)   . HTN (hypertension)   . Irregular heart beat   . Nocturia   . Urinary retention    Past Surgical History:  Procedure Laterality Date  . AMPUTATION TOE Left 05/04/2017   Procedure: AMPUTATION TOE - third left toe;  Surgeon: Samara Deist, DPM;  Location: [facility] ORS;  Service: Podiatry;  Laterality: Left;  . bladder patching  1964  . HIP SURGERY Right 2014  . IRRIGATION AND DEBRIDEMENT FOOT Left 11/14/2016   Procedure: IRRIGATION AND DEBRIDEMENT FOOT;  Surgeon: Samara Deist, DPM;  Location: [facility] ORS;  Service: Podiatry;  Laterality:  Left;  . TRANSURETHRAL RESECTION OF PROSTATE     Family History  Problem Relation Age of Onset  . Ulcers Mother        stomach  . GER disease Sister        GERD  . Heart attack Father   . Diabetes Brother   . Depression Brother   . Kidney disease Neg Hx   . Prostate cancer Neg Hx   . Kidney cancer Neg Hx   . Bladder Cancer Neg Hx    Social History   Tobacco Use  . Smoking status: Never Smoker  . Smokeless tobacco: Never Used  Substance Use Topics  . Alcohol use: No   Allergies  Allergen Reactions  . Hydrocodone-Acetaminophen Nausea And Vomiting   Prior to Admission medications   Medication Sig Start Date End Date Taking? Authorizing Provider  acetaminophen (TYLENOL) 500 MG tablet Take 500 mg by mouth every 6 (six) hours as needed.    Yes [provider]  albuterol (VENTOLIN HFA) 108 (90 Base) MCG/ACT inhaler Inhale 1-2 puffs into the lungs every 4 (four) hours as needed.  07/07/18  Yes [provider]  aspirin 81 MG tablet Take 81 mg by mouth daily.   Yes [provider]  bimatoprost (LUMIGAN) 0.01 % SOLN 1 drop nightly   Yes [provider]  clopidogrel (PLAVIX) 75 MG tablet Take by mouth. 11/26/18  Yes [provider]  Cyanocobalamin (B-12) 1000 MCG/ML KIT Inject 1 mL as directed every 30 (  thirty) days.   Yes [provider]  donepezil (ARICEPT) 5 MG tablet Take 5 mg by mouth at bedtime.  08/22/17  Yes [provider]  dorzolamide-timolol (COSOPT) 22.3-6.8 MG/ML ophthalmic solution Place 1 drop into both eyes 2 (two) times daily.    Yes [provider]  EMOLLIENT EX Apply topically daily.    Yes [provider]  esomeprazole (NEXIUM) 20 MG capsule Take 20 mg by mouth daily at 12 noon.   Yes [provider]  ferrous sulfate 325 (65 FE) MG tablet Take 325 mg by mouth daily.   Yes [provider]  furosemide (LASIX) 40 MG tablet Take 40 mg by mouth daily. 04/15/19  Yes [provider]  ketoconazole (NIZORAL) 2 % cream Apply 1 application topically daily as needed.  08/31/18  Yes [provider]  lisinopril (ZESTRIL) 2.5 MG tablet Take 2.5 mg by mouth daily.  07/07/18  Yes [provider]  metoprolol succinate (TOPROL-XL) 50 MG 24 hr tablet Take 150 mg by mouth daily.  07/07/18  Yes [provider]  nitroGLYCERIN (NITROSTAT) 0.4 MG SL tablet Place 0.4 mg under the tongue every 5 (five) minutes as needed.  06/17/18  Yes [provider]  potassium chloride (K-DUR) 10 MEQ tablet Take 10 mEq by mouth daily.    Yes [provider]  sertraline (ZOLOFT) 25 MG tablet Take 25 mg by mouth daily.  08/31/18  Yes [provider]  simethicone (MYLICON) 856 MG chewable tablet Chew 125 mg by mouth every 6 (six) hours as needed.    Yes [provider]  umeclidinium-vilanterol (ANORO ELLIPTA) 62.5-25 MCG/INH AEPB Inhale 1 puff into the lungs daily as needed.  08/05/18  Yes [provider]  Fluticasone-Salmeterol (ADVAIR) 250-50 MCG/DOSE AEPB Inhale 1 puff into the lungs as needed.     [provider]     Review of Systems  Constitutional: Negative for appetite change and fatigue.  HENT: Positive for rhinorrhea. Negative for congestion and sore throat.   Eyes: Negative.   Respiratory: Positive for shortness of breath. Negative for cough.   Cardiovascular: Negative for chest pain, palpitations and leg swelling.  Gastrointestinal: Negative for abdominal distention and abdominal pain.  Endocrine: Negative.   Genitourinary: Negative.   Musculoskeletal: Negative for back pain and neck pain.  Skin: Positive for wound (left lower leg).  Allergic/Immunologic: Negative.   Neurological: Negative for dizziness and light-headedness.  Hematological: Negative for adenopathy. Bruises/bleeds easily.  Psychiatric/Behavioral: Negative for dysphoric mood and sleep disturbance (sleeping on 2 pillows with HOB elevated).  The patient is not nervous/anxious.    Vitals:   06/11/19 1032  BP: 107/84  Pulse: 87  Resp: 16  SpO2: 99%  Weight: 238 lb 2 oz (108 kg)  Height: 6' 1"  (1.854 m)   Wt Readings from Last 3 Encounters:  06/11/19 238 lb 2 oz (108 kg)  06/04/19 238 lb (108 kg)  05/27/19 238 lb (108 kg)   Lab Results  Component Value Date   CREATININE 1.38 (H) 05/19/2019   CREATININE 1.29 (H) 05/18/2019   CREATININE 1.02 05/16/2019     Physical Exam Vitals and nursing note reviewed. Exam conducted with a chaperone present.  Constitutional:      Appearance: Normal appearance.  HENT:     Head: Normocephalic and atraumatic.  Cardiovascular:     Rate and Rhythm: Normal rate and regular rhythm.  Pulmonary:     Effort: Pulmonary effort is normal. No respiratory distress.  Breath sounds: No wheezing or rales.  Abdominal:     General: There is no distension.     Palpations: Abdomen is soft.  Musculoskeletal:        General: No tenderness.     Cervical back: Normal range of motion and neck supple.     Right lower leg: No edema.     Left lower leg: No edema.     Comments: Both legs currently wrapped in UNNA boots  Skin:    General: Skin is warm and dry.  Neurological:     General: No focal deficit present.     Mental Status: He is alert and oriented to person, place, and time.  Psychiatric:        Mood and Affect: Mood normal.        Behavior: Behavior normal.        Thought Content: Thought content normal.    Assessment & Plan:  1: Chronic heart failure with preserved ejection fraction with structural changes- - NYHA class II - euvolemic today - weighing daily and he says that his weight has been stable; reminded to call for an overnight weight gain of >2 pounds or a weekly weight gain of >5 pounds - not adding salt to his food and says that his daughter / son provide most of his meals - saw cardiology Jeremy Herrera) 02/26/19 - currently has both lower legs wrapped in UNNA boots and says  they get changed weekly - BP too low to consider changing his lisinopril to entresto - PT coming twice weekly - BNP 05/16/19 was 166.0 - Pharm D reconciled medications with the patient  2: HTN- - BP looks good although on the low side - saw PCP (Jeremy Herrera) 05/29/19 - BMP 05/19/19 reviewed and showed sodium 137, potassium 4.0, creatinine 1.38 and GFR 48  3: DM- - A1c 05/29/19 was 6.2% - diet controlled; checks his glucose every now and then   Patient did not bring his medications nor a list. Each medication was verbally reviewed with the patient and he was encouraged to bring the bottles to every visit to confirm accuracy of list.  Return in 2 months or sooner for any questions/problems before then.

## 2019-06-11 ENCOUNTER — Ambulatory Visit: Payer: Medicare PPO | Attending: Family | Admitting: Family

## 2019-06-11 ENCOUNTER — Other Ambulatory Visit: Payer: Self-pay

## 2019-06-11 ENCOUNTER — Encounter: Payer: Self-pay | Admitting: Family

## 2019-06-11 VITALS — BP 107/84 | HR 87 | Resp 16 | Ht 73.0 in | Wt 238.1 lb

## 2019-06-11 DIAGNOSIS — Z8249 Family history of ischemic heart disease and other diseases of the circulatory system: Secondary | ICD-10-CM | POA: Diagnosis not present

## 2019-06-11 DIAGNOSIS — Z7951 Long term (current) use of inhaled steroids: Secondary | ICD-10-CM | POA: Insufficient documentation

## 2019-06-11 DIAGNOSIS — N400 Enlarged prostate without lower urinary tract symptoms: Secondary | ICD-10-CM | POA: Insufficient documentation

## 2019-06-11 DIAGNOSIS — Z7982 Long term (current) use of aspirin: Secondary | ICD-10-CM | POA: Insufficient documentation

## 2019-06-11 DIAGNOSIS — E119 Type 2 diabetes mellitus without complications: Secondary | ICD-10-CM | POA: Diagnosis not present

## 2019-06-11 DIAGNOSIS — I5022 Chronic systolic (congestive) heart failure: Secondary | ICD-10-CM

## 2019-06-11 DIAGNOSIS — I5032 Chronic diastolic (congestive) heart failure: Secondary | ICD-10-CM | POA: Insufficient documentation

## 2019-06-11 DIAGNOSIS — J3489 Other specified disorders of nose and nasal sinuses: Secondary | ICD-10-CM | POA: Diagnosis not present

## 2019-06-11 DIAGNOSIS — Z79899 Other long term (current) drug therapy: Secondary | ICD-10-CM | POA: Insufficient documentation

## 2019-06-11 DIAGNOSIS — I10 Essential (primary) hypertension: Secondary | ICD-10-CM

## 2019-06-11 DIAGNOSIS — K219 Gastro-esophageal reflux disease without esophagitis: Secondary | ICD-10-CM | POA: Insufficient documentation

## 2019-06-11 DIAGNOSIS — I110 Hypertensive heart disease with heart failure: Secondary | ICD-10-CM | POA: Diagnosis not present

## 2019-06-11 DIAGNOSIS — E785 Hyperlipidemia, unspecified: Secondary | ICD-10-CM | POA: Diagnosis not present

## 2019-06-11 DIAGNOSIS — N1831 Chronic kidney disease, stage 3a: Secondary | ICD-10-CM

## 2019-06-11 DIAGNOSIS — Z885 Allergy status to narcotic agent status: Secondary | ICD-10-CM | POA: Diagnosis not present

## 2019-06-11 DIAGNOSIS — Z833 Family history of diabetes mellitus: Secondary | ICD-10-CM | POA: Insufficient documentation

## 2019-06-11 NOTE — Patient Instructions (Signed)
Continue weighing daily and call for an overnight weight gain of > 2 pounds or a weekly weight gain of >5 pounds. 

## 2019-06-25 ENCOUNTER — Other Ambulatory Visit: Payer: Self-pay

## 2019-06-25 ENCOUNTER — Inpatient Hospital Stay: Payer: Medicare PPO | Attending: Hematology and Oncology

## 2019-06-25 VITALS — BP 115/80 | HR 91 | Temp 97.2°F | Resp 18

## 2019-06-25 DIAGNOSIS — E538 Deficiency of other specified B group vitamins: Secondary | ICD-10-CM | POA: Insufficient documentation

## 2019-06-25 MED ORDER — CYANOCOBALAMIN 1000 MCG/ML IJ SOLN
1000.0000 ug | Freq: Once | INTRAMUSCULAR | Status: AC
  Administered 2019-06-25: 16:00:00 1000 ug via INTRAMUSCULAR

## 2019-06-25 NOTE — Patient Instructions (Signed)

## 2019-07-02 ENCOUNTER — Ambulatory Visit (INDEPENDENT_AMBULATORY_CARE_PROVIDER_SITE_OTHER): Payer: Medicare PPO | Admitting: Nurse Practitioner

## 2019-07-02 ENCOUNTER — Other Ambulatory Visit: Payer: Self-pay

## 2019-07-02 ENCOUNTER — Encounter (INDEPENDENT_AMBULATORY_CARE_PROVIDER_SITE_OTHER): Payer: Self-pay | Admitting: Nurse Practitioner

## 2019-07-02 VITALS — BP 110/71 | HR 116 | Ht 73.0 in | Wt 240.0 lb

## 2019-07-02 DIAGNOSIS — I872 Venous insufficiency (chronic) (peripheral): Secondary | ICD-10-CM | POA: Diagnosis not present

## 2019-07-02 DIAGNOSIS — L97222 Non-pressure chronic ulcer of left calf with fat layer exposed: Secondary | ICD-10-CM

## 2019-07-02 DIAGNOSIS — E785 Hyperlipidemia, unspecified: Secondary | ICD-10-CM

## 2019-07-04 ENCOUNTER — Encounter (INDEPENDENT_AMBULATORY_CARE_PROVIDER_SITE_OTHER): Payer: Self-pay | Admitting: Nurse Practitioner

## 2019-07-04 NOTE — Progress Notes (Signed)
Subjective:    Patient ID: Jeremy Herrera, male    DOB: 25-Jan-1941, 79 y.o.   MRN: [id number] Chief Complaint  Patient presents with  . Follow-up    4wk unna check    Patient is seen for follow up evaluation of leg pain and swelling associated with venous ulceration. The patient was recently seen here and started on Unna boot therapy.  The swelling abruptly became much worse bilaterally and is associated with pain and discoloration. The pain and swelling worsens with prolonged dependency and improves with elevation.  The patient notes that in the morning the legs are better but the leg symptoms worsened throughout the course of the day. The patient has also noted a progressive worsening of the discoloration in the ankle and shin area.   Today however, the wound has completely resolved.  The patient states that his lower extremities feel much better and the swelling is much better controlled.   The patient states that they have been elevating as much as possible. The patient denies any recent changes in medications.  The patient denies a history of DVT or PE. There is no prior history of phlebitis. There is no history of primary lymphedema.  No SOB or increased cough.  No sputum production.  No recent episodes of CHF exacerbation.   Review of Systems  Cardiovascular: Positive for leg swelling.       Objective:   Physical Exam Vitals reviewed. Exam conducted with a chaperone present (Daughter present).  Constitutional:      Appearance: Normal appearance.  HENT:     Head: Normocephalic.  Cardiovascular:     Rate and Rhythm: Normal rate and regular rhythm.     Pulses: Normal pulses.     Heart sounds: Normal heart sounds.  Pulmonary:     Effort: Pulmonary effort is normal.     Breath sounds: Normal breath sounds.  Musculoskeletal:     Right lower leg: Edema present.     Left lower leg: Edema present.  Skin:    General: Skin is warm and dry.  Neurological:     Mental  Status: He is alert and oriented to person, place, and time.  Psychiatric:        Mood and Affect: Mood normal.        Behavior: Behavior normal.        Thought Content: Thought content normal.        Judgment: Judgment normal.     BP 110/71   Pulse (!) 116   Ht _0  (1.854 m)   Wt 240 lb (108.9 kg)   BMI 31.66 kg/m   Past Medical History:  Diagnosis Date  . Anemia   . BPH (benign prostatic hyperplasia)   . CHF (congestive heart failure) ([place])   . Diabetes mellitus without complication ([place])   . Dysphagia   . GERD (gastroesophageal reflux disease)   . HLD (hyperlipidemia)   . HTN (hypertension)   . Irregular heart beat   . Nocturia   . Urinary retention     Social History   Socioeconomic History  . Marital status: Widowed    Spouse name: Not on file  . Number of children: Not on file  . Years of education: Not on file  . Highest education level: Not on file  Occupational History  . Not on file  Tobacco Use  . Smoking status: Never Smoker  . Smokeless tobacco: Never Used  Substance and Sexual Activity  . Alcohol use:  No  . Drug use: No  . Sexual activity: Not Currently  Other Topics Concern  . Not on file  Social History Narrative  . Not on file   Social Determinants of Health   Financial Resource Strain:   . Difficulty of Paying Living Expenses:   Food Insecurity:   . Worried About Charity fundraiser in the Last Year:   . Arboriculturist in the Last Year:   Transportation Needs:   . Film/video editor (Medical):   Marland Kitchen Lack of Transportation (Non-Medical):   Physical Activity:   . Days of Exercise per Week:   . Minutes of Exercise per Session:   Stress:   . Feeling of Stress :   Social Connections:   . Frequency of Communication with Friends and Family:   . Frequency of Social Gatherings with Friends and Family:   . Attends Religious Services:   . Active Member of Clubs or Organizations:   . Attends Archivist Meetings:   Marland Kitchen  Marital Status:   Intimate Partner Violence:   . Fear of Current or Ex-Partner:   . Emotionally Abused:   Marland Kitchen Physically Abused:   . Sexually Abused:     Past Surgical History:  Procedure Laterality Date  . AMPUTATION TOE Left 05/04/2017   Procedure: AMPUTATION TOE - third left toe;  Surgeon: Samara Deist, DPM;  Location: [facility] OR[facility];  Service: Podiatry;  Laterality: Left;  . bladder patching  1964  . HIP SURGERY Right 2014  . IRRIGATION AND DEBRIDEMENT FOOT Left 11/14/2016   Procedure: IRRIGATION AND DEBRIDEMENT FOOT;  Surgeon: Samara Deist, DPM;  Location: [facility] OR[facility];  Service: Podiatry;  Laterality: Left;  . TRANSURETHRAL RESECTION OF PROSTATE      Family History  Problem Relation Age of Onset  . Ulcers Mother        stomach  . GER disease Sister        GERD  . Heart attack Father   . Diabetes Brother   . Depression Brother   . Kidney disease Neg Hx   . Prostate cancer Neg Hx   . Kidney cancer Neg Hx   . Bladder Cancer Neg Hx     Allergies  Allergen Reactions  . Hydrocodone-Acetaminophen Nausea And Vomiting       Assessment & Plan:   1. Lower limb ulcer, calf, left, with fat layer exposed ([place]) The lower extremity ulceration has resolved at this time.  We will remove the patient from [place] wraps.  Patient is advised to contact her office if a new wound or ulceration should occur prior to his follow-up visit.  2. Venous insufficiency of both lower extremities No surgery or intervention at this point in time.    I have reviewed my discussion with the patient regarding venous insufficiency and secondary lymph edema and why it  causes symptoms. I have discussed with the patient the chronic skin changes that accompany these problems and the long term sequela such as ulceration and infection.  Patient will continue wearing graduated compression stockings class 1 (20-30 mmHg) on a daily basis a prescription was given to the patient to keep this updated. The patient will  put the  stockings on first thing in the morning and removing them in the evening. The patient is instructed specifically not to sleep in the stockings.  In addition, behavioral modification including elevation during the day will be continued.  Diet and salt restriction was also discussed.  Patient will follow  up in 3 months evaluation of lower extremity swelling.  3. Hyperlipidemia, unspecified hyperlipidemia type Continue statin as ordered and reviewed, no changes at this time    Current Outpatient Medications on File Prior to Visit  Medication Sig Dispense Refill  . acetaminophen (TYLENOL) 500 MG tablet Take 500 mg by mouth every 6 (six) hours as needed.     Marland Kitchen albuterol (VENTOLIN HFA) 108 (90 Base) MCG/ACT inhaler Inhale 1-2 puffs into the lungs every 4 (four) hours as needed.     Marland Kitchen aspirin 81 MG tablet Take 81 mg by mouth daily.    . bimatoprost (LUMIGAN) 0.01 % SOLN 1 drop nightly    . clopidogrel (PLAVIX) 75 MG tablet Take by mouth.    . Cyanocobalamin (B-12) 1000 MCG/ML KIT Inject 1 mL as directed every 30 (thirty) days.    Marland Kitchen donepezil (ARICEPT) 5 MG tablet Take 5 mg by mouth at bedtime.     . dorzolamide-timolol (COSOPT) 22.3-6.8 MG/ML ophthalmic solution Place 1 drop into both eyes 2 (two) times daily.     Marland Kitchen EMOLLIENT EX Apply topically daily.     Marland Kitchen esomeprazole (NEXIUM) 20 MG capsule Take 20 mg by mouth daily at 12 noon.    . ferrous sulfate 325 (65 FE) MG tablet Take 325 mg by mouth daily.    . Fluticasone-Salmeterol (ADVAIR) 250-50 MCG/DOSE AEPB Inhale 1 puff into the lungs as needed.     . furosemide (LASIX) 40 MG tablet Take 40 mg by mouth daily.    Marland Kitchen ketoconazole (NIZORAL) 2 % cream Apply 1 application topically daily as needed.     Marland Kitchen lisinopril (ZESTRIL) 2.5 MG tablet Take 2.5 mg by mouth daily.     . metoprolol succinate (TOPROL-XL) 50 MG 24 hr tablet Take 150 mg by mouth daily.     . nitroGLYCERIN (NITROSTAT) 0.4 MG SL tablet Place 0.4 mg under the tongue every 5 (five) minutes  as needed.     . potassium chloride (K-DUR) 10 MEQ tablet Take 10 mEq by mouth daily.     . sertraline (ZOLOFT) 25 MG tablet Take 25 mg by mouth daily.     . simethicone (MYLICON) 081 MG chewable tablet Chew 125 mg by mouth every 6 (six) hours as needed.     . umeclidinium-vilanterol (ANORO ELLIPTA) 62.5-25 MCG/INH AEPB Inhale 1 puff into the lungs daily as needed.      No current facility-administered medications on file prior to visit.    There are no Patient Instructions on file for this visit. No follow-ups on file.   Kris Hartmann, NP

## 2019-07-23 ENCOUNTER — Other Ambulatory Visit: Payer: Self-pay

## 2019-07-23 ENCOUNTER — Inpatient Hospital Stay: Payer: Medicare PPO | Attending: Hematology and Oncology

## 2019-07-23 DIAGNOSIS — D693 Immune thrombocytopenic purpura: Secondary | ICD-10-CM | POA: Diagnosis not present

## 2019-07-23 DIAGNOSIS — E538 Deficiency of other specified B group vitamins: Secondary | ICD-10-CM | POA: Diagnosis not present

## 2019-07-23 MED ORDER — CYANOCOBALAMIN 1000 MCG/ML IJ SOLN
1000.0000 ug | Freq: Once | INTRAMUSCULAR | Status: AC
  Administered 2019-07-23: 1000 ug via INTRAMUSCULAR
  Filled 2019-07-23: qty 1

## 2019-07-31 NOTE — Progress Notes (Signed)
Patient ID: Jeremy Herrera, male    DOB: [birth year], 79 y.o.   MRN: [id number]  HPI Jeremy Herrera is a 79 y/o male with a history of DM, hyperlipidemia, HTN, GERD, anemia, dysphagia and chronic heart failure.   Echo report from 05/17/19 reviewed and showed an EF of 55-60% along with mild LVH, mildly elevated PA pressure, mild Jeremy, mild AS and severe LAE.   Admitted 05/16/19 due to acute on chronic HF. Wound consult obtained. Initially given IV lasix and then transitioned to oral diuretics. Weaned off supplemental oxygen. Antibiotics started but then stopped as no pneumonia. Discharged after 3 days.   He presents today for a follow-up visit with a chief complaint of minimal shortness of breath upon moderate exertion. He describes this as chronic in nature having been present for several years. He has associated rhinorrhea, pedal edema and easy bruising along with this. He denies any difficulty sleeping, dizziness, abdominal distention, palpitations, chest pain, cough, fatigue or weight gain.   He has received his 1st COVID vaccine and is due to get his 2nd vaccine tomorrow.   Past Medical History:  Diagnosis Date  . Anemia   . BPH (benign prostatic hyperplasia)   . CHF (congestive heart failure) ([place])   . Diabetes mellitus without complication ([place])   . Dysphagia   . GERD (gastroesophageal reflux disease)   . HLD (hyperlipidemia)   . HTN (hypertension)   . Irregular heart beat   . Nocturia   . Urinary retention    Past Surgical History:  Procedure Laterality Date  . AMPUTATION TOE Left 05/04/2017   Procedure: AMPUTATION TOE - third left toe;  Surgeon: Samara Deist, DPM;  Location: [facility];  Service: Podiatry;  Laterality: Left;  . bladder patching  1964  . HIP SURGERY Right 2014  . IRRIGATION AND DEBRIDEMENT FOOT Left 11/14/2016   Procedure: IRRIGATION AND DEBRIDEMENT FOOT;  Surgeon: Samara Deist, DPM;  Location: [facility] OR[facility];  Service: Podiatry;  Laterality: Left;  . TRANSURETHRAL  RESECTION OF PROSTATE     Family History  Problem Relation Age of Onset  . Ulcers Mother        stomach  . GER disease Sister        GERD  . Heart attack Father   . Diabetes Brother   . Depression Brother   . Kidney disease Neg Hx   . Prostate cancer Neg Hx   . Kidney cancer Neg Hx   . Bladder Cancer Neg Hx    Social History   Tobacco Use  . Smoking status: Never Smoker  . Smokeless tobacco: Never Used  Substance Use Topics  . Alcohol use: No   Allergies  Allergen Reactions  . Hydrocodone-Acetaminophen Nausea And Vomiting   Prior to Admission medications   Medication Sig Start Date End Date Taking? Authorizing Provider  acetaminophen (TYLENOL) 500 MG tablet Take 500 mg by mouth every 6 (six) hours as needed.    Yes [provider]  albuterol (VENTOLIN HFA) 108 (90 Base) MCG/ACT inhaler Inhale 1-2 puffs into the lungs every 4 (four) hours as needed.  07/07/18  Yes [provider]  aspirin 81 MG tablet Take 81 mg by mouth daily.   Yes [provider]  bimatoprost (LUMIGAN) 0.01 % SOLN 1 drop nightly   Yes [provider]  Cyanocobalamin (B-12) 1000 MCG/ML KIT Inject 1 mL as directed every 30 (thirty) days.   Yes [provider]  donepezil (ARICEPT) 5 MG tablet Take 5 mg  by mouth at bedtime.  08/22/17  Yes [provider]  dorzolamide-timolol (COSOPT) 22.3-6.8 MG/ML ophthalmic solution Place 1 drop into both eyes 2 (two) times daily.    Yes [provider]  EMOLLIENT EX Apply topically daily.    Yes [provider]  esomeprazole (NEXIUM) 20 MG capsule Take 20 mg by mouth daily at 12 noon.   Yes [provider]  ferrous sulfate 325 (65 FE) MG tablet Take 325 mg by mouth daily.   Yes [provider]  Fluticasone-Salmeterol (ADVAIR) 250-50 MCG/DOSE AEPB Inhale 1 puff into the lungs as needed.    Yes [provider]  furosemide (LASIX) 40 MG tablet Take 40 mg by mouth daily. 04/15/19  Yes  [provider]  ketoconazole (NIZORAL) 2 % cream Apply 1 application topically daily as needed.  08/31/18  Yes [provider]  lisinopril (ZESTRIL) 2.5 MG tablet Take 2.5 mg by mouth daily.  07/07/18  Yes [provider]  metoprolol succinate (TOPROL-XL) 50 MG 24 hr tablet Take 150 mg by mouth daily.  07/07/18  Yes [provider]  nitroGLYCERIN (NITROSTAT) 0.4 MG SL tablet Place 0.4 mg under the tongue every 5 (five) minutes as needed.  06/17/18  Yes [provider]  potassium chloride (K-DUR) 10 MEQ tablet Take 10 mEq by mouth daily.    Yes [provider]  sertraline (ZOLOFT) 25 MG tablet Take 25 mg by mouth daily.  08/31/18  Yes [provider]  simethicone (MYLICON) 778 MG chewable tablet Chew 125 mg by mouth every 6 (six) hours as needed.    Yes [provider]  umeclidinium-vilanterol (ANORO ELLIPTA) 62.5-25 MCG/INH AEPB Inhale 1 puff into the lungs daily as needed. Takes daily 08/05/18  Yes [provider]  clopidogrel (PLAVIX) 75 MG tablet Take by mouth. Patient not taking: Reported on 08/01/2019 11/26/18   [provider]     Review of Systems  Constitutional: Negative for appetite change and fatigue.  HENT: Positive for rhinorrhea. Negative for congestion and sore throat.   Eyes: Negative.   Respiratory: Positive for shortness of breath. Negative for cough.   Cardiovascular: Positive for leg swelling (minimal in left leg). Negative for chest pain and palpitations.  Gastrointestinal: Negative for abdominal distention and abdominal pain.  Endocrine: Negative.   Genitourinary: Negative.   Musculoskeletal: Negative for back pain and neck pain.  Skin: Positive for wound (left lower leg).  Allergic/Immunologic: Negative.   Neurological: Negative for dizziness and light-headedness.  Hematological: Negative for adenopathy. Bruises/bleeds easily.  Psychiatric/Behavioral: Negative for dysphoric mood and  sleep disturbance (sleeping on 2 pillows with HOB elevated). The patient is not nervous/anxious.    Vitals:   08/01/19 0950  BP: 102/76  Pulse: 98  Resp: 18  SpO2: 99%  Weight: 241 lb 4 oz (109.4 kg)  Height: 6' 1"  (1.854 m)   Wt Readings from Last 3 Encounters:  08/01/19 241 lb 4 oz (109.4 kg)  07/02/19 240 lb (108.9 kg)  06/11/19 238 lb 2 oz (108 kg)   Lab Results  Component Value Date   CREATININE 1.38 (H) 05/19/2019   CREATININE 1.29 (H) 05/18/2019   CREATININE 1.02 05/16/2019    Physical Exam Vitals and nursing note reviewed. Exam conducted with a chaperone present.  Constitutional:      Appearance: Normal appearance.  HENT:     Head: Normocephalic and atraumatic.  Cardiovascular:     Rate and Rhythm: Normal rate and regular rhythm.  Pulmonary:  Effort: Pulmonary effort is normal. No respiratory distress.     Breath sounds: No wheezing or rales.  Abdominal:     General: There is no distension.     Palpations: Abdomen is soft.  Musculoskeletal:        General: No tenderness.     Cervical back: Normal range of motion and neck supple.     Right lower leg: Edema (trace pitting) present.     Left lower leg: Edema (1+ pitting) present.  Skin:    General: Skin is warm and dry.  Neurological:     General: No focal deficit present.     Mental Status: He is alert and oriented to person, place, and time.  Psychiatric:        Mood and Affect: Mood normal.        Behavior: Behavior normal.        Thought Content: Thought content normal.    Assessment & Plan:  1: Chronic heart failure with preserved ejection fraction with structural changes- - NYHA class II - euvolemic today - weighing daily and he says that his weight has been stable; reminded to call for an overnight weight gain of >2 pounds or a weekly weight gain of >5 pounds - weight up 3 pounds from last visit here 6 weeks ago - not adding salt to his food  - saw cardiology Nehemiah Massed) 07/08/19 - encouraged  him to wear his compression socks daily with removal at bedtime as well as to elevate his legs when sitting for long periods of time - BP too low to consider changing his lisinopril to entresto - BNP 05/16/19 was 166.0 - has received his 1st COVID vaccine and gets his 2nd dose tomorrow  2: HTN- - BP looks good although on the low side - saw PCP (Feldpausch) 05/29/19 - BMP 05/29/19 reviewed and showed sodium 140, potassium 5.0, creatinine 1.1 and GFR 64  3: DM- - A1c 05/29/19 was 6.2% - diet controlled; checks his glucose every now and then - saw vascular Owens Shark) 07/02/19   Patient did not bring his medications nor a list. Each medication was verbally reviewed with the patient and he was encouraged to bring the bottles to every visit to confirm accuracy of list.  Due to HF stability, will not make a return appointment for patient at this time. Advised patient and caregiver with him that he could call back at anytime to schedule another appointment.

## 2019-08-01 ENCOUNTER — Ambulatory Visit: Payer: Medicare PPO | Attending: Family | Admitting: Family

## 2019-08-01 ENCOUNTER — Encounter: Payer: Self-pay | Admitting: Family

## 2019-08-01 ENCOUNTER — Other Ambulatory Visit: Payer: Self-pay

## 2019-08-01 VITALS — BP 102/76 | HR 98 | Resp 18 | Ht 73.0 in | Wt 241.2 lb

## 2019-08-01 DIAGNOSIS — I5032 Chronic diastolic (congestive) heart failure: Secondary | ICD-10-CM | POA: Insufficient documentation

## 2019-08-01 DIAGNOSIS — Z7982 Long term (current) use of aspirin: Secondary | ICD-10-CM | POA: Insufficient documentation

## 2019-08-01 DIAGNOSIS — I5022 Chronic systolic (congestive) heart failure: Secondary | ICD-10-CM

## 2019-08-01 DIAGNOSIS — E119 Type 2 diabetes mellitus without complications: Secondary | ICD-10-CM | POA: Diagnosis not present

## 2019-08-01 DIAGNOSIS — I110 Hypertensive heart disease with heart failure: Secondary | ICD-10-CM | POA: Insufficient documentation

## 2019-08-01 DIAGNOSIS — R0602 Shortness of breath: Secondary | ICD-10-CM | POA: Diagnosis present

## 2019-08-01 DIAGNOSIS — Z7901 Long term (current) use of anticoagulants: Secondary | ICD-10-CM | POA: Insufficient documentation

## 2019-08-01 DIAGNOSIS — E1122 Type 2 diabetes mellitus with diabetic chronic kidney disease: Secondary | ICD-10-CM

## 2019-08-01 DIAGNOSIS — K219 Gastro-esophageal reflux disease without esophagitis: Secondary | ICD-10-CM | POA: Diagnosis not present

## 2019-08-01 DIAGNOSIS — I10 Essential (primary) hypertension: Secondary | ICD-10-CM

## 2019-08-01 DIAGNOSIS — Z79899 Other long term (current) drug therapy: Secondary | ICD-10-CM | POA: Insufficient documentation

## 2019-08-01 DIAGNOSIS — E785 Hyperlipidemia, unspecified: Secondary | ICD-10-CM | POA: Insufficient documentation

## 2019-08-01 NOTE — Patient Instructions (Addendum)
Continue weighing daily and call for an overnight weight gain of > 2 pounds or a weekly weight gain of >5 pounds.   Call us in the future if you'd like to schedule another appointment 

## 2019-08-19 NOTE — Progress Notes (Addendum)
[facility]  [address] [place], [place] [address] Phone: [phone]  Fax: [phone]   Clinic Day:  08/20/2019  Referring physician: Sofie Hartigan, MD  Chief Complaint: Jeremy Herrera is a 79 y.o. male with B12 deficiency, iron deficiency, and mild thrombocytopenia (ITP) who is seen for a 6 month assessment.   HPI: The patient was last seen in the hematology clinic on 03/05/2019. At that time, he felt "pretty good".  He got winded easily.  He denied any bleeding.  Exam was stable. Hematocrit was 40.9, hemoglobin 13.9, platelets 131,000, WBC 5,800. BUN was 26. Creatinine was 1.25. Calcium was 8.8. Alkaline phosphatase was 133. Ferritin was 141. Vitamin B12 was 539 and folate 19.9.  The patient was admitted to [facility] from 05/16/2019 - 05/19/2019 with worsening shortness of breath for several days. Upon arrival, his oxygen saturation was 89% on room air. He was put on 2 L nasal cannula oxygen, which was weaned down over the course of his stay. CXR revealed increased patchy opacity within the right mid lung c/wearly infiltrate. Echo revealed EF of 55-60%.   He was felt to have a CHF exacerbation given his elevated BNP and lower extremity edema.  He was diuresed.  Antibiotics were discontinued as he did not have a fever or leukocytosis.  The patient was discharged with home health PT.  The patient saw Eulogio Ditch, NP on 07/02/2019. He was being followed for a left calf ulcer, which had resolved at that time. He had venous insufficiency in both lower extremities.  No surgery or intervention was recommended.  The patient received monthly vitamin B12 x 5 (04/03/2019 - 07/23/2019).  Labs followed: 03/18/2019: Hematocrit 38.9, hemoglobin 12.7, MCV 95.5, platelets 134,000, WBC 6,400. 05/16/2019: Hematocrit 40.6, hemoglobin 13.6, MCV -----,  platelets 128,000, WBC 8,500. 05/28/2019: Hematocrit 37.3, hemoglobin 12.4, MCV 93.7,  platelets 141,000, WBC 5,900.   Ferritin 137. 05/29/2019: Hematocrit 39.2, hemoglobin 13.1, MCV 94.5,  platelets 141,000, WBC 7,700.  During the interim, he reports that his blood pressure has been low. His systolic BBs are between 91-791 and his diastolic BPs are between 50-56. He feels dizzy sometimes, but not right now. Dr. Nehemiah Massed took him off of Plavix about a month ago. He is now on aspirin. He takes po iron.  He denies bruising, bleeding, blood in the stool, and black stools. 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 He uses a walker in public and a cane at home.  He received the Moderna COVID-19 vaccine on 07/04/2019 and 08/02/2019.   Past Medical History:  Diagnosis Date  . Anemia   . BPH (benign prostatic hyperplasia)   . CHF (congestive heart failure) ([place])   . Diabetes mellitus without complication ([place])   . Dysphagia   . GERD (gastroesophageal reflux disease)   . HLD (hyperlipidemia)   . HTN (hypertension)   . Irregular heart beat   . Nocturia   . Urinary retention     Past Surgical History:  Procedure Laterality Date  . AMPUTATION TOE Left 05/04/2017   Procedure: AMPUTATION TOE - third left toe;  Surgeon: Samara Deist, DPM;  Location: [facility] ORS;  Service: Podiatry;  Laterality: Left;  . bladder patching  1964  . HIP SURGERY Right 2014  . IRRIGATION AND DEBRIDEMENT FOOT Left 11/14/2016   Procedure: IRRIGATION AND DEBRIDEMENT FOOT;  Surgeon: Samara Deist, DPM;  Location: [facility] ORS;  Service: Podiatry;  Laterality: Left;  . TRANSURETHRAL RESECTION OF PROSTATE  Family History  Problem Relation Age of Onset  . Ulcers Mother        stomach  . GER disease Sister        GERD  . Heart attack Father   . Diabetes Brother   . Depression Brother   . Kidney disease Neg Hx   . Prostate cancer Neg Hx   . Kidney cancer Neg Hx   . Bladder Cancer Neg Hx     Social History:  reports that he has never smoked. He has never used smokeless tobacco. He reports that he does not drink alcohol and  does not use drugs. He lives in [place] by himself. He has been staying with his daughter the past month following two hospitalizations. He lives in [place].  The patient is alone today.  Allergies:  Allergies  Allergen Reactions  . Hydrocodone-Acetaminophen Nausea And Vomiting    Current Medications: Current Outpatient Medications  Medication Sig Dispense Refill  . Acetaminophen 500 MG capsule SMARTSIG:By Mouth    . albuterol (VENTOLIN HFA) 108 (90 Base) MCG/ACT inhaler Inhale 1-2 puffs into the lungs every 4 (four) hours as needed.     Marland Kitchen aspirin 81 MG tablet Take 81 mg by mouth daily.    . bimatoprost (LUMIGAN) 0.01 % SOLN 1 drop nightly    . Cyanocobalamin (B-12) 1000 MCG/ML KIT Inject 1 mL as directed every 30 (thirty) days.    Marland Kitchen donepezil (ARICEPT) 5 MG tablet Take 5 mg by mouth at bedtime.     . dorzolamide-timolol (COSOPT) 22.3-6.8 MG/ML ophthalmic solution Place 1 drop into both eyes 2 (two) times daily.     Marland Kitchen esomeprazole (NEXIUM) 20 MG capsule Take 20 mg by mouth daily at 12 noon.    . ferrous sulfate 325 (65 FE) MG tablet Take 325 mg by mouth daily.    . furosemide (LASIX) 40 MG tablet Take 40 mg by mouth daily.    Marland Kitchen ketoconazole (NIZORAL) 2 % cream Apply 1 application topically daily as needed.     Marland Kitchen lisinopril (ZESTRIL) 2.5 MG tablet Take 2.5 mg by mouth daily.     . metoprolol succinate (TOPROL-XL) 50 MG 24 hr tablet Take 150 mg by mouth daily.     . potassium chloride (K-DUR) 10 MEQ tablet Take 10 mEq by mouth daily.     . sertraline (ZOLOFT) 25 MG tablet Take 25 mg by mouth daily.     . Skin Protectants, Misc. (REMEDY PHYTOPLEX HYDRAGUARD) CREA Apply topically.    Marland Kitchen umeclidinium-vilanterol (ANORO ELLIPTA) 62.5-25 MCG/INH AEPB Inhale 1 puff into the lungs daily as needed. Takes daily    . clopidogrel (PLAVIX) 75 MG tablet Take by mouth. (Patient not taking: Reported on 08/01/2019)    . EMOLLIENT EX Apply topically daily.  (Patient not taking: Reported on 08/20/2019)    .  Fluticasone-Salmeterol (ADVAIR) 250-50 MCG/DOSE AEPB Inhale 1 puff into the lungs as needed.  (Patient not taking: Reported on 08/20/2019)    . nitroGLYCERIN (NITROSTAT) 0.4 MG SL tablet Place 0.4 mg under the tongue every 5 (five) minutes as needed.  (Patient not taking: Reported on 08/20/2019)    . simethicone (MYLICON) 585 MG chewable tablet Chew 125 mg by mouth every 6 (six) hours as needed.  (Patient not taking: Reported on 08/20/2019)     No current facility-administered medications for this visit.    Review of Systems  Constitutional: Negative.  Negative for chills, diaphoresis, fever, malaise/fatigue and weight loss (stable).  HENT: Negative.  Negative  for congestion, ear pain, hearing loss, nosebleeds, sinus pain and sore throat.   Eyes: Negative.  Negative for blurred vision and double vision.  Respiratory: Negative for cough, hemoptysis, sputum production and shortness of breath.   Cardiovascular: Negative.  Negative for chest pain, palpitations, orthopnea, leg swelling and PND.  Gastrointestinal: Negative.  Negative for abdominal pain, blood in stool, constipation, diarrhea, melena, nausea and vomiting.  Genitourinary: Negative.  Negative for dysuria, frequency, hematuria and urgency.  Musculoskeletal: Negative for back pain, falls, joint pain and myalgias.       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  Skin: Negative.  Negative for itching and rash.  Neurological: Positive for dizziness (occasional) and sensory change (neuropathy in feet; stable). Negative for tremors, focal weakness, weakness and headaches.       Balance issues, uses a walker in public and a cane in his house  Endo/Heme/Allergies: Does not bruise/bleed easily.  Psychiatric/Behavioral: Negative.  Negative for depression and memory loss. The patient is not nervous/anxious and does not have insomnia.   All other systems reviewed and are negative.  Performance status (ECOG):  1-2  Vitals Blood pressure (!) 63/52, pulse  85, resp. rate 19, weight 239 lb (108.4 kg), SpO2 98 %.   Physical Exam Vitals and nursing note reviewed.  Constitutional:      General: He is not in acute distress.    Appearance: He is well-developed. He is not diaphoretic.     Comments: He has a rolling walker at his side. Uses walker to get up on exam table.  HENT:     Head: Normocephalic and atraumatic.     Comments: Short gray hair.    Mouth/Throat:     Pharynx: No oropharyngeal exudate.  Eyes:     General: No scleral icterus.    Extraocular Movements: Extraocular movements intact.     Conjunctiva/sclera: Conjunctivae normal.     Pupils: Pupils are equal, round, and reactive to light.     Comments: Glasses.  Neck:     Vascular: No JVD.  Cardiovascular:     Rate and Rhythm: Normal rate and regular rhythm.     Heart sounds: Normal heart sounds. No murmur heard.   Pulmonary:     Effort: Pulmonary effort is normal. No respiratory distress.     Breath sounds: Normal breath sounds. No wheezing or rales.  Abdominal:     General: Bowel sounds are normal. There is no distension.     Palpations: Abdomen is soft.     Tenderness: There is no abdominal tenderness. There is no guarding or rebound.  Musculoskeletal:        General: No swelling or tenderness. Normal range of motion.     Cervical back: Normal range of motion and neck supple.     Right lower leg: No edema.     Left lower leg: No edema.  Lymphadenopathy:     Head:     Right side of head: No preauricular, posterior auricular or occipital adenopathy.     Left side of head: No preauricular, posterior auricular or occipital adenopathy.     Cervical: No cervical adenopathy.     Upper Body:     Right upper body: No supraclavicular adenopathy.     Left upper body: No supraclavicular adenopathy.     Lower Body: No right inguinal adenopathy. No left inguinal adenopathy.  Skin:    General: Skin is warm and dry.     Findings: No bruising or erythema.  Nails: There is no  clubbing.  Neurological:     Mental Status: He is alert and oriented to person, place, and time.  Psychiatric:        Mood and Affect: Mood normal.        Behavior: Behavior normal.        Thought Content: Thought content normal.        Judgment: Judgment normal.     Appointment on 08/20/2019  Component Date Value Ref Range Status  . WBC 08/20/2019 6.4  4.0 - 10.5 K/uL Final  . RBC 08/20/2019 4.45  4.22 - 5.81 MIL/uL Final  . Hemoglobin 08/20/2019 13.8  13.0 - 17.0 g/dL Final  . HCT 08/20/2019 40.5  39 - 52 % Final  . MCV 08/20/2019 91.0  80.0 - 100.0 fL Final  . MCH 08/20/2019 31.0  26.0 - 34.0 pg Final  . MCHC 08/20/2019 34.1  30.0 - 36.0 g/dL Final  . RDW 08/20/2019 13.7  11.5 - 15.5 % Final  . Platelets 08/20/2019 150  150 - 400 K/uL Final  . nRBC 08/20/2019 0.0  0.0 - 0.2 % Final  . Neutrophils Relative % 08/20/2019 59  % Final  . Neutro Abs 08/20/2019 3.7  1.7 - 7.7 K/uL Final  . Lymphocytes Relative 08/20/2019 31  % Final  . Lymphs Abs 08/20/2019 2.0  0.7 - 4.0 K/uL Final  . Monocytes Relative 08/20/2019 6  % Final  . Monocytes Absolute 08/20/2019 0.4  0 - 1 K/uL Final  . Eosinophils Relative 08/20/2019 3  % Final  . Eosinophils Absolute 08/20/2019 0.2  0 - 0 K/uL Final  . Basophils Relative 08/20/2019 1  % Final  . Basophils Absolute 08/20/2019 0.1  0 - 0 K/uL Final  . Immature Granulocytes 08/20/2019 0  % Final  . Abs Immature Granulocytes 08/20/2019 0.02  0.00 - 0.07 K/uL Final   Performed at [facility], [address], [place], [place] [address]  . Sodium 08/20/2019 138  135 - 145 mmol/L Final  . Potassium 08/20/2019 4.5  3.5 - 5.1 mmol/L Final  . Chloride 08/20/2019 107  98 - 111 mmol/L Final  . CO2 08/20/2019 24  22 - 32 mmol/L Final  . Glucose, Bld 08/20/2019 180* 70 - 99 mg/dL Final   Glucose reference range applies only to samples taken after fasting for at least 8 hours.  . BUN 08/20/2019 36* 8 - 23 mg/dL Final  . Creatinine, Ser 08/20/2019  1.76* 0.61 - 1.24 mg/dL Final  . Calcium 08/20/2019 8.3* 8.9 - 10.3 mg/dL Final  . Total Protein 08/20/2019 6.8  6.5 - 8.1 g/dL Final  . Albumin 08/20/2019 3.8  3.5 - 5.0 g/dL Final  . AST 08/20/2019 19  15 - 41 U/L Final  . ALT 08/20/2019 14  0 - 44 U/L Final  . Alkaline Phosphatase 08/20/2019 105  38 - 126 U/L Final  . Total Bilirubin 08/20/2019 1.0  0.3 - 1.2 mg/dL Final  . GFR calc non Af Amer 08/20/2019 36* >60 mL/min Final  . GFR calc Af Amer 08/20/2019 41* >60 mL/min Final  . Anion gap 08/20/2019 7  5 - 15 Final   Performed at [facility], [address], [place], [place] [address]    Assessment:  Jeremy Herrera is a 79 y.o. 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 He denies any new medications or herbal products. He has no known autoimmune disease.  He has never required transfusion.   Work-up on 09/16/2014 revealed a hematocrit 36.4, hemoglobin 12.3, MCV 91.5, platelets 124,000 white count 4800 with an ANC of 3000. Differential was unremarkable. B12 was 262 (low normal) with an elevated MMA consistent with B12 deficiency. He began weekly B12 on 10/14/2014 (last 07/23/2019). Folatewas 18.1 on 08/30/2016 and 19.9 on 03/04/2019.  Normal labs included: folate, CMP, ANA, iron studies, hepatitis C antibody, hepatitis B surface antigen and core antibody, HIV testing, SPEP, free light chains, and PTT. TSH was normal on 08/25/2014. UPEP revealed no monoclonal protein.  He has a history ofiron deficiency anemia. EGD and colonoscopy 10 years ago were negative. He required esophageal dilatation. He has a history of reflux. Colonoscopy 3-4 years ago was unremarkable per the patient. His diet is good. He eats meat twice a week. He is on oral iron.  Ferritinhas been followed: 61 on 11/24/2015, 73 on 02/23/2016, 79 on 05/24/2016, 83 on 07/04/2016, 87 on 08/30/2016, 85 on  02/21/2017, 164 on 05/16/2017, 100 on 08/08/2017, 87 on 10/31/2017, 110 on 01/23/2018, 141 on 03/04/2019, 137 on 05/28/2019, and 125 on 08/20/2019.  He was diagnosed with a community acquired pneumoniaon 05/20/2015. He is completing a Z pack. Chest CT angiogramon 05/20/2015 revealed a nodular focus of consolidation (indeterminate) for which neoplasm could not be completely excluded. Recommendation was for chest CT without contrast 1-2 months.   Chest CTon 07/05/2015 revealed resolved medial right lower lobe consolidation with residual mild ground-glass opacity. There was mild patchy subpleural reticulation and ground-glass attenuation in both lungs, cannot exclude interstitial lung disease such as nonspecific interstitial pneumonia (NSIP).   High resolution chest CTon 07/04/2016 was compatible with interstitial lung disease. The overall pattern of disease was nonspecific, however, there was clear evidence of progression compared to the prior study 07/05/2015. Findings are worrisome for potential usual interstitial pneumonia (UIP), however, this may simply reflect chronic hypersensitivity pneumonitis or nonspecific interstitial pneumonia (NSIP). Accordingly, repeat high-resolution chest CT is recommended in 12 months to assess for temporal changes in the appearance of the lung parenchyma. Most pulmonary nodules are stable except a 7 x 5 mm nodule in the left lower lobe which has slightly enlarged. Attention at time of follow-up examination in 1 year was recommended.   High resolution chest CTon 07/04/2017 revealed a spectrum of findings suggestive of a mild nonprogressive fibrotic interstitial lung disease. 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 There were scattered solid pulmonary nodules stable since 07/04/2016 chest CT and considered benign. Ground-glass  micronodularity visualized predominantly at the lung bases on the prior scan was largely resolved, compatible with resolved inflammatory nodularity.  He has a history of an elevated alkaline phosphatase(138-151) without trend since 09/2011.  He underwent [employer] left atrial appendage closure device surgery on 09/25/2018.  He received the Moderna COVID-19 vaccine on 07/04/2019 and 08/02/2019.  Symptomatically, he notes that his blood pressure has been running low.  He denies any bleeding.  He is on oral iron.  Plan: 1.   Labs today: CBC with diff, CMP, ferritin 2.   Thrombocytopenia Platelet count 150,000. He denies any bruising or bleeding. He is on aspirin. 3.   Iron deficiency anemia, resolved Hematocrit 40.5.  Hemoglobin 13.8. Ferritin 125.  Continue to monitor. 4.   B12 deficiency B12 was 539 and folate 19.9 on 03/04/2019. Continue B12 today and monthly x11. 5.   Atrial fibrillation He is on aspirin.  He underwent [employer] left atrial appendage closure device surgery on 09/25/2018. 6.   RTC in 6 months for labs (CBC with diff, ferritin, folate) and B12. 7.   RTC in 1 year for MD assessment, labs (CBC with diff, ferritin) and B12.  I discussed the assessment and treatment plan with the patient.  The patient was provided an opportunity to ask questions and all were answered.  The patient agreed with the plan and demonstrated an understanding of the instructions.  The patient was advised to call back if the symptoms worsen or if the condition fails to improve as anticipated.  I provided 15 minutes of face-to-face time during this this encounter and > 50% was spent counseling as documented under my assessment and plan.  An additional 5 minutes were spent reviewing his notes and labs in [place].   Lequita Asal, MD, PhD    08/20/2019, 2:26 PM  I, Mirian Mo Tufford, am acting as a Education administrator for Lequita Asal, MD.  I, De Pere Mike Gip, MD, have reviewed  the above documentation for accuracy and completeness, and I agree with the above.

## 2019-08-20 ENCOUNTER — Inpatient Hospital Stay: Payer: Medicare PPO | Admitting: Hematology and Oncology

## 2019-08-20 ENCOUNTER — Encounter: Payer: Self-pay | Admitting: Hematology and Oncology

## 2019-08-20 ENCOUNTER — Telehealth: Payer: Self-pay

## 2019-08-20 ENCOUNTER — Inpatient Hospital Stay: Payer: Medicare PPO

## 2019-08-20 ENCOUNTER — Other Ambulatory Visit: Payer: Self-pay

## 2019-08-20 VITALS — BP 103/75 | HR 85 | Resp 19 | Wt 239.0 lb

## 2019-08-20 DIAGNOSIS — D696 Thrombocytopenia, unspecified: Secondary | ICD-10-CM

## 2019-08-20 DIAGNOSIS — D509 Iron deficiency anemia, unspecified: Secondary | ICD-10-CM | POA: Diagnosis not present

## 2019-08-20 DIAGNOSIS — E538 Deficiency of other specified B group vitamins: Secondary | ICD-10-CM

## 2019-08-20 LAB — CBC WITH DIFFERENTIAL/PLATELET
Abs Immature Granulocytes: 0.02 10*3/uL (ref 0.00–0.07)
Basophils Absolute: 0.1 10*3/uL (ref 0.0–0.1)
Basophils Relative: 1 %
Eosinophils Absolute: 0.2 10*3/uL (ref 0.0–0.5)
Eosinophils Relative: 3 %
HCT: 40.5 % (ref 39.0–52.0)
Hemoglobin: 13.8 g/dL (ref 13.0–17.0)
Immature Granulocytes: 0 %
Lymphocytes Relative: 31 %
Lymphs Abs: 2 10*3/uL (ref 0.7–4.0)
MCH: 31 pg (ref 26.0–34.0)
MCHC: 34.1 g/dL (ref 30.0–36.0)
MCV: 91 fL (ref 80.0–100.0)
Monocytes Absolute: 0.4 10*3/uL (ref 0.1–1.0)
Monocytes Relative: 6 %
Neutro Abs: 3.7 10*3/uL (ref 1.7–7.7)
Neutrophils Relative %: 59 %
Platelets: 150 10*3/uL (ref 150–400)
RBC: 4.45 MIL/uL (ref 4.22–5.81)
RDW: 13.7 % (ref 11.5–15.5)
WBC: 6.4 10*3/uL (ref 4.0–10.5)
nRBC: 0 % (ref 0.0–0.2)

## 2019-08-20 LAB — COMPREHENSIVE METABOLIC PANEL
ALT: 14 U/L (ref 0–44)
AST: 19 U/L (ref 15–41)
Albumin: 3.8 g/dL (ref 3.5–5.0)
Alkaline Phosphatase: 105 U/L (ref 38–126)
Anion gap: 7 (ref 5–15)
BUN: 36 mg/dL — ABNORMAL HIGH (ref 8–23)
CO2: 24 mmol/L (ref 22–32)
Calcium: 8.3 mg/dL — ABNORMAL LOW (ref 8.9–10.3)
Chloride: 107 mmol/L (ref 98–111)
Creatinine, Ser: 1.76 mg/dL — ABNORMAL HIGH (ref 0.61–1.24)
GFR calc Af Amer: 41 mL/min — ABNORMAL LOW (ref >60)
GFR calc non Af Amer: 36 mL/min — ABNORMAL LOW (ref >60)
Glucose, Bld: 180 mg/dL — ABNORMAL HIGH (ref 70–99)
Potassium: 4.5 mmol/L (ref 3.5–5.1)
Sodium: 138 mmol/L (ref 135–145)
Total Bilirubin: 1 mg/dL (ref 0.3–1.2)
Total Protein: 6.8 g/dL (ref 6.5–8.1)

## 2019-08-20 LAB — FERRITIN: Ferritin: 125 ng/mL (ref 24–336)

## 2019-08-20 MED ORDER — CYANOCOBALAMIN 1000 MCG/ML IJ SOLN
INTRAMUSCULAR | Status: AC
  Filled 2019-08-20: qty 1

## 2019-08-20 MED ORDER — CYANOCOBALAMIN 1000 MCG/ML IJ SOLN
1000.0000 ug | Freq: Once | INTRAMUSCULAR | Status: AC
  Administered 2019-08-20: 1000 ug via INTRAMUSCULAR

## 2019-08-20 NOTE — Telephone Encounter (Signed)
-----   Message from Rosey Bath, MD sent at 08/20/2019  3:41 PM EDT ----- Regarding: Please send to PCP  ----- Message ----- From: Interface, Lab In [place] Sent: 08/20/2019   1:48 PM EDT To: Rosey Bath, MD

## 2019-08-20 NOTE — Progress Notes (Signed)
Pt report SOB with exertion and easy bruising which has improved since he is no longer on blood thinners. He will be moving in with his daughter in the next couple months. He denies being dizzy or light headed.

## 2019-08-20 NOTE — Telephone Encounter (Signed)
Sent to pcp

## 2019-08-21 ENCOUNTER — Telehealth: Payer: Self-pay

## 2019-08-21 NOTE — Telephone Encounter (Signed)
The patient labs has been routed to his PCP.

## 2019-09-17 ENCOUNTER — Inpatient Hospital Stay: Payer: Medicare PPO | Attending: Hematology and Oncology

## 2019-09-17 ENCOUNTER — Other Ambulatory Visit: Payer: Self-pay

## 2019-09-17 VITALS — Temp 98.5°F

## 2019-09-17 DIAGNOSIS — E538 Deficiency of other specified B group vitamins: Secondary | ICD-10-CM | POA: Diagnosis not present

## 2019-09-17 MED ORDER — CYANOCOBALAMIN 1000 MCG/ML IJ SOLN
1000.0000 ug | Freq: Once | INTRAMUSCULAR | Status: AC
  Administered 2019-09-17: 1000 ug via INTRAMUSCULAR
  Filled 2019-09-17: qty 1

## 2019-10-08 IMAGING — CR DG PELVIS 1-2V
2 series · 3 of 3 positions shown · non-contrast
Comparison: Right hip series 03/24/2013 and earlier

CLINICAL DATA: 78-year-old male status post fall this morning with
hip pain. Previous right hip arthroplasty.

EXAM:
PELVIS - 1-2 VIEW

[Series 1: pelvis ap · 0.14mm/px · 2 of 2 slices shown (1 of 2)]
[im 1/2]
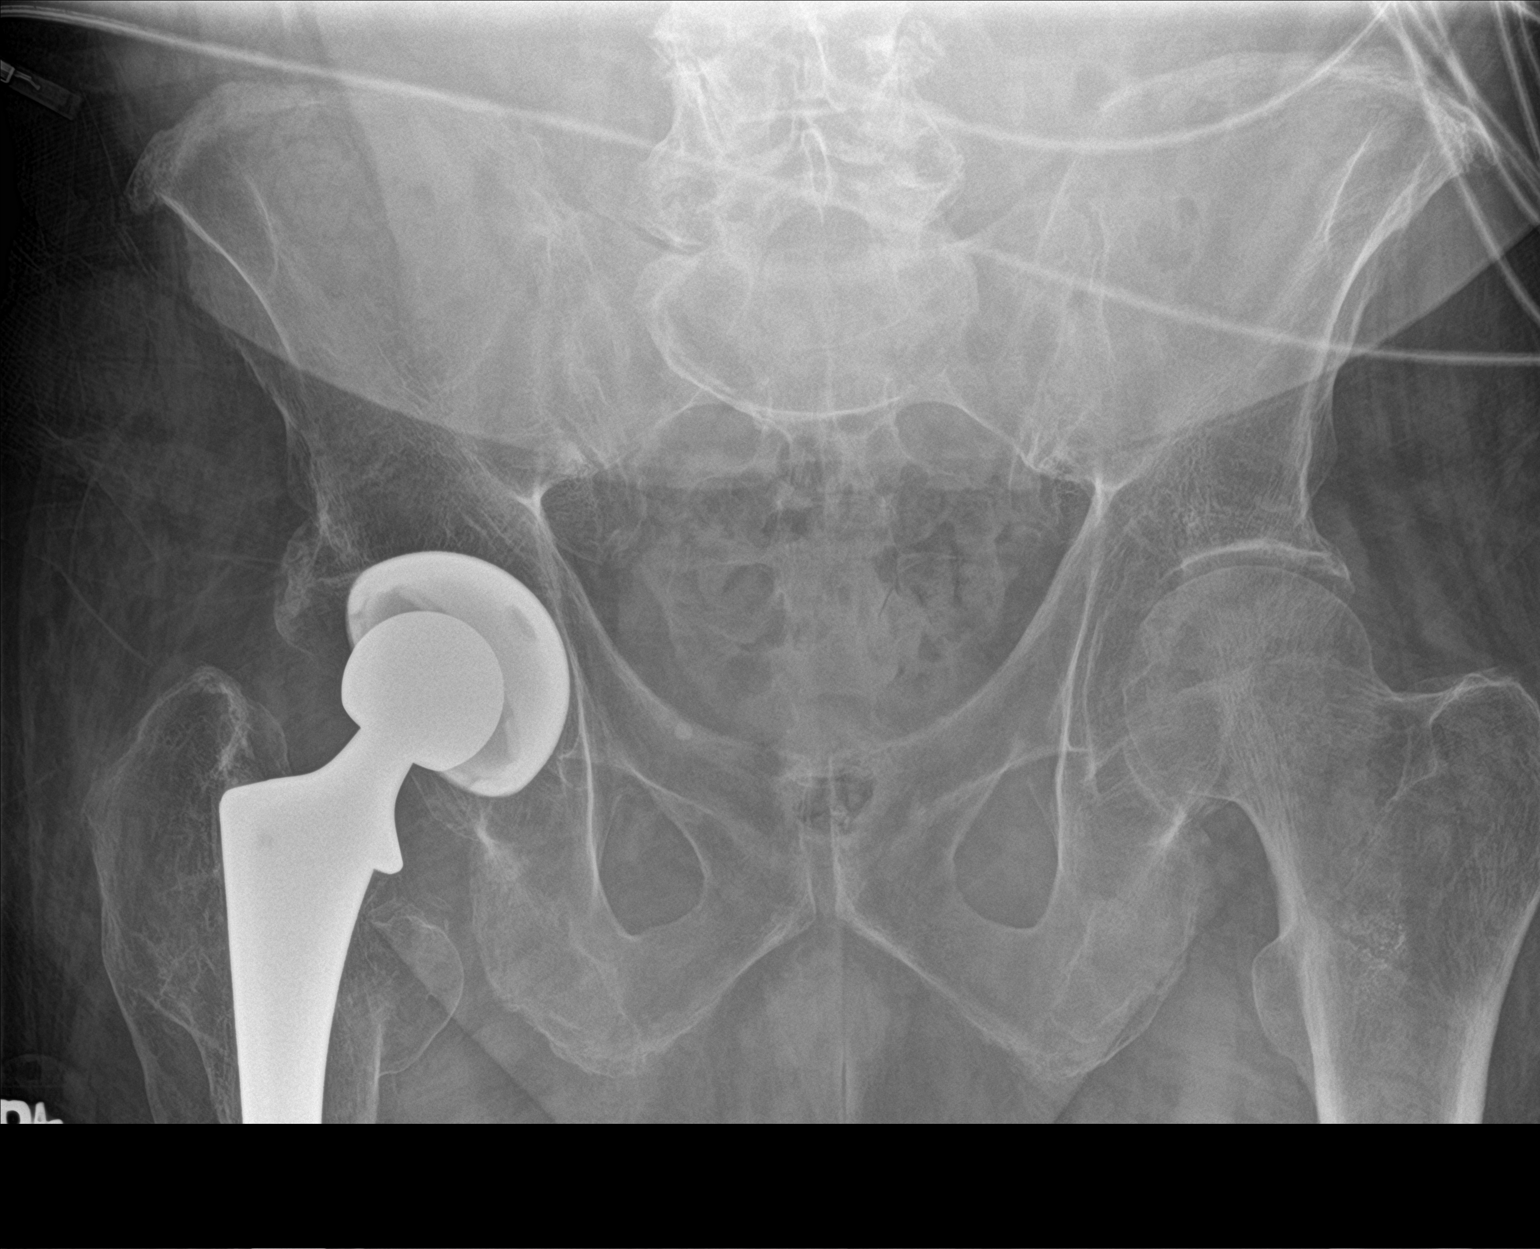
[im 2/2]
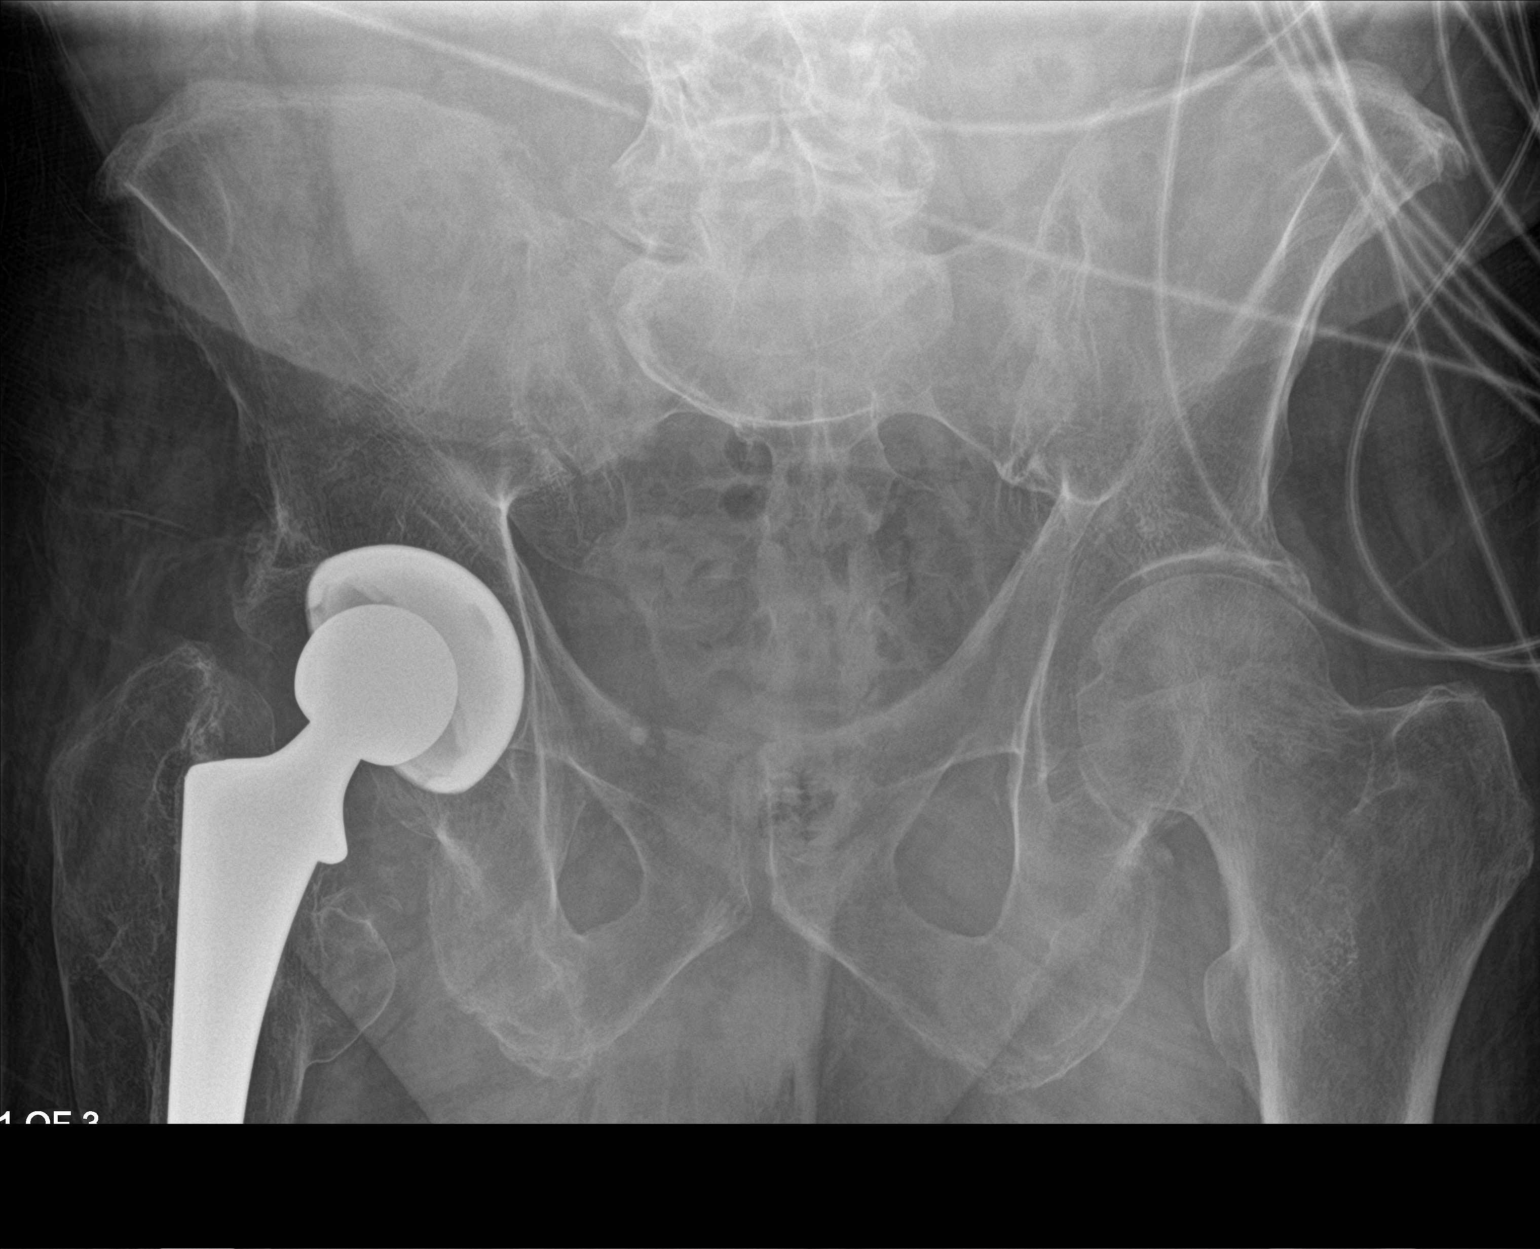

[pelvis ap (2 of 2)]
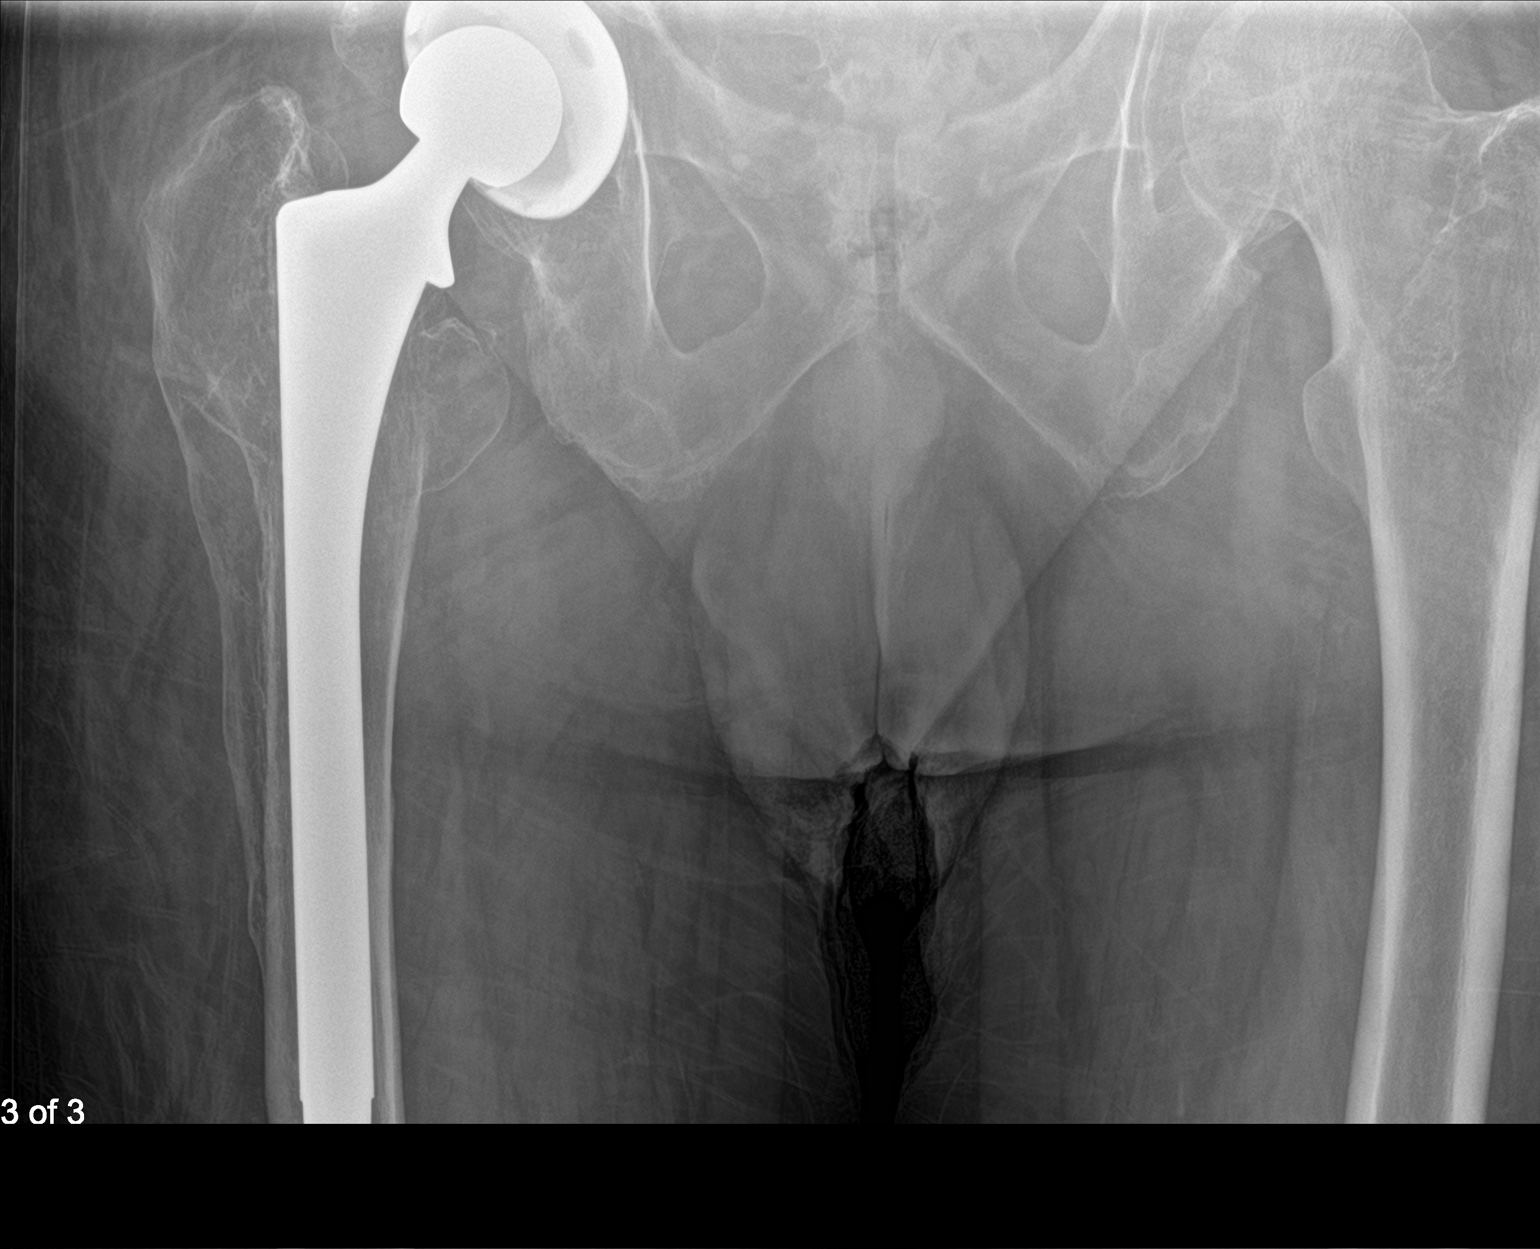

[3 of 3 positions shown; findings below may reference images not displayed]

FINDINGS: Three views of the pelvis to include all of the proximal right femur
implant. No acute pelvic fracture identified. Osteopenia. Grossly
intact proximal left femur. Chronic right hip arthroplasty with bony
callus in the proximal right femur. The hardware appears intact, and
normally aligned in the AP plane. No acute osseous abnormality
identified. Partially visible lumbar spine degeneration.
IMPRESSION: 1. No acute fracture or dislocation identified about the pelvis.
2. Right hip hardware intact.

## 2019-10-08 IMAGING — CT CT HEAD W/O CM
3 of 4 series · 14 of 47 positions shown, 16 images · non-contrast
Comparison: Head CTs 05/05/2017 and earlier.

CLINICAL DATA: 78-year-old male with increased weakness and
shortness of breath.

EXAM:
CT HEAD WITHOUT CONTRAST
TECHNIQUE: Contiguous axial images were obtained from the base of the skull
through the vertex without intravenous contrast.

[Series 4: coronal soft tissue · coronal · 0.30mm/px · 3 of 70 slices shown]
[im 24/70  brain]
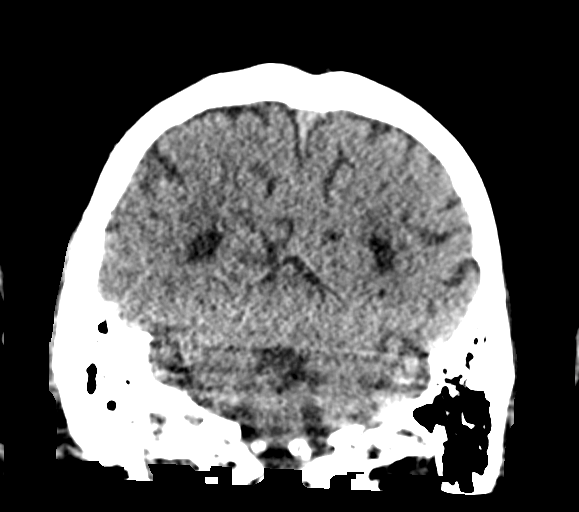
[im 31/70  brain]
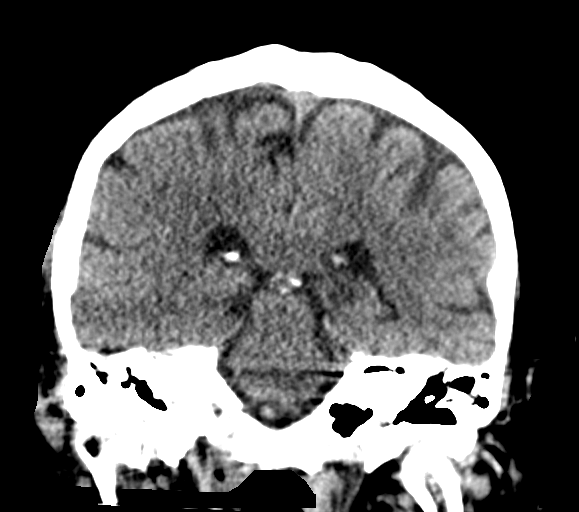
[im 39/70  brain]
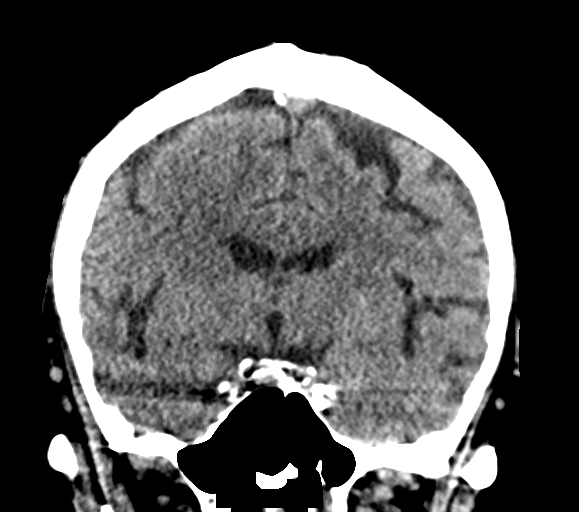

[Series 5: sagittal soft tissue · sagittal · 0.30mm/px · 3 of 53 slices shown]
[im 18/53  brain]
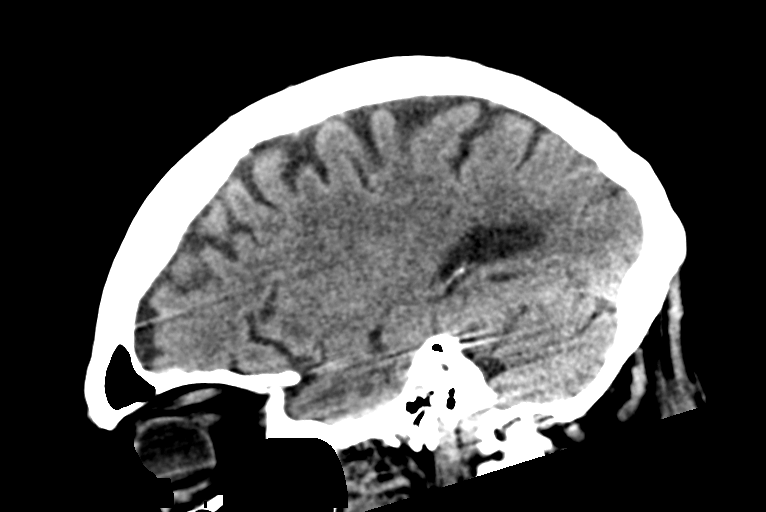
[im 27/53  brain]
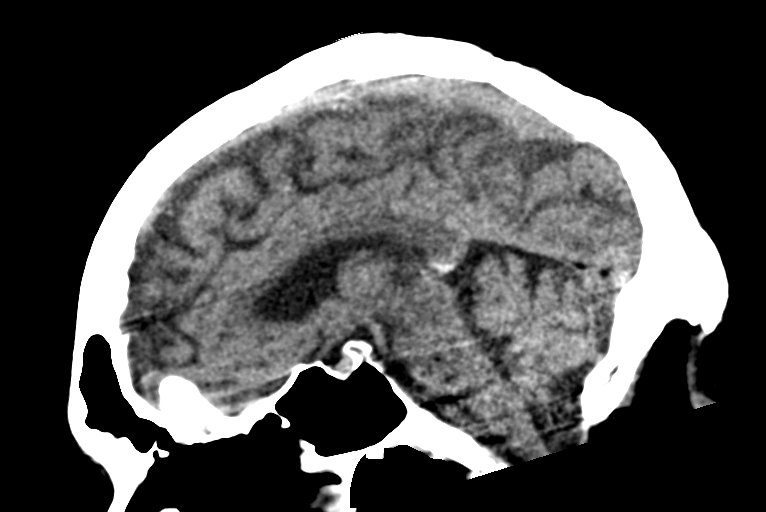
[im 35/53  brain]
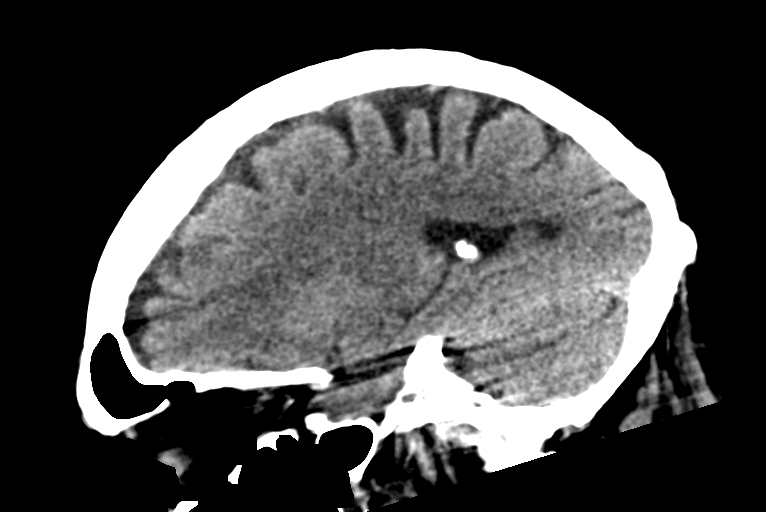

[Series 6: ax head wo-corrected · axial · 0.36mm/px · z∈[+427,+549]mm · 8 of 31 slices shown, 10 images]
[im 3/31  brain]
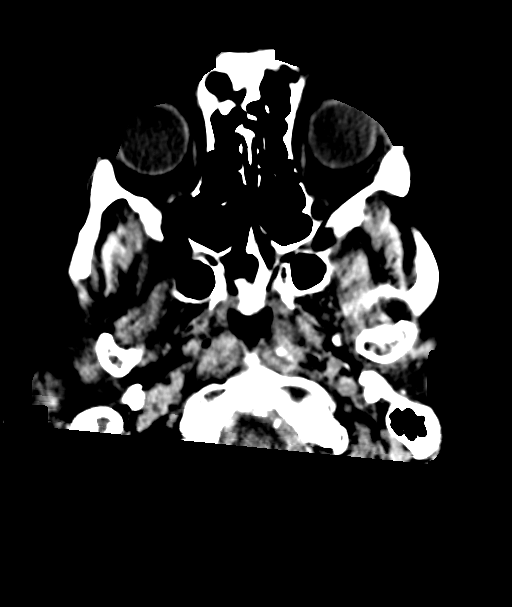
[im 3/31  bone]
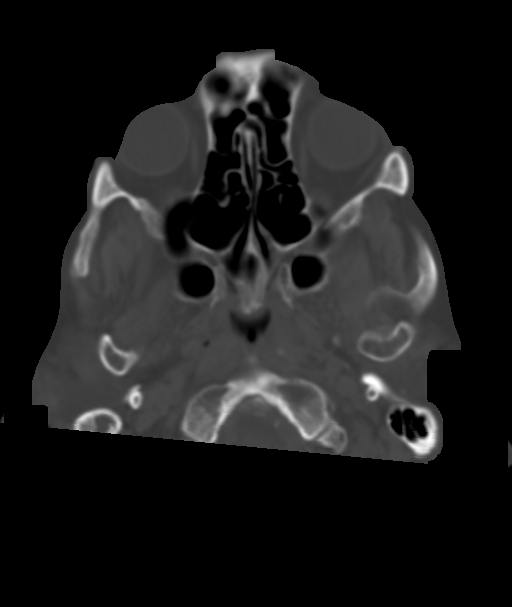
[im 7/31  brain]
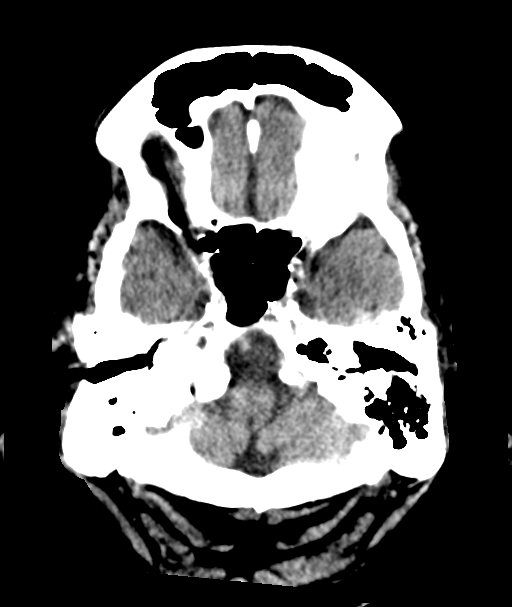
[im 11/31  brain]
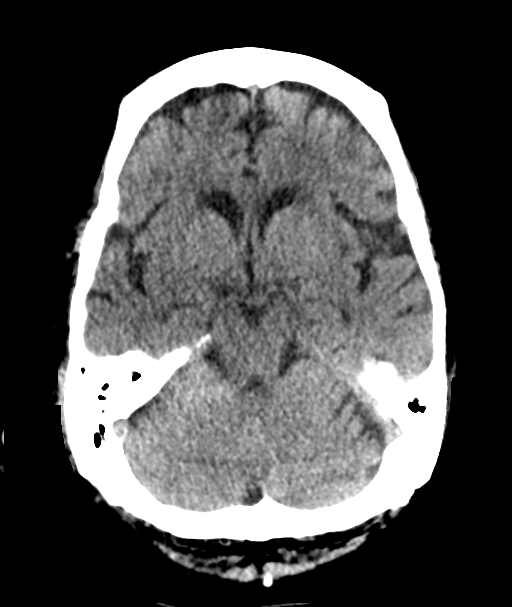
[im 15/31  brain]
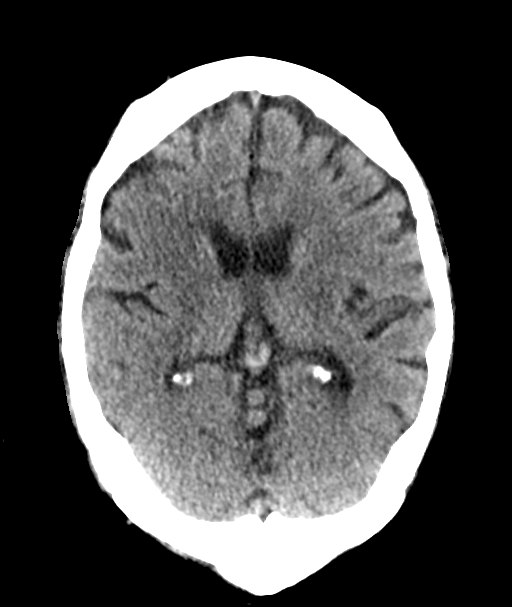
[im 17/31  brain]
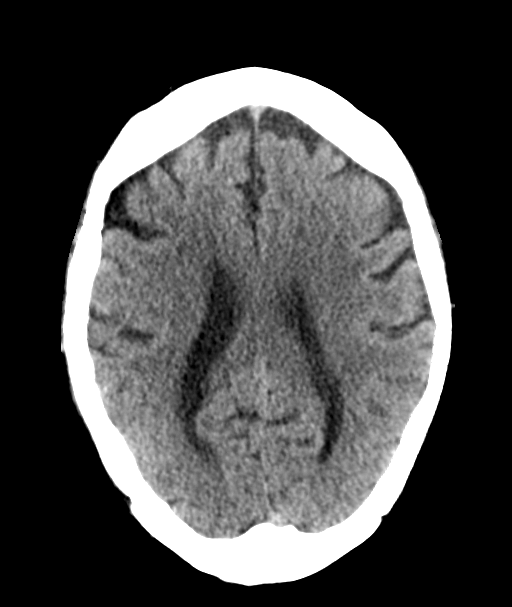
[im 17/31  bone]
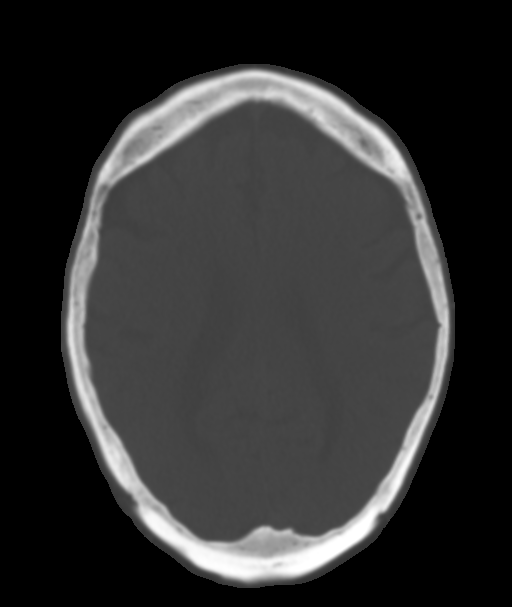
[im 21/31  brain]
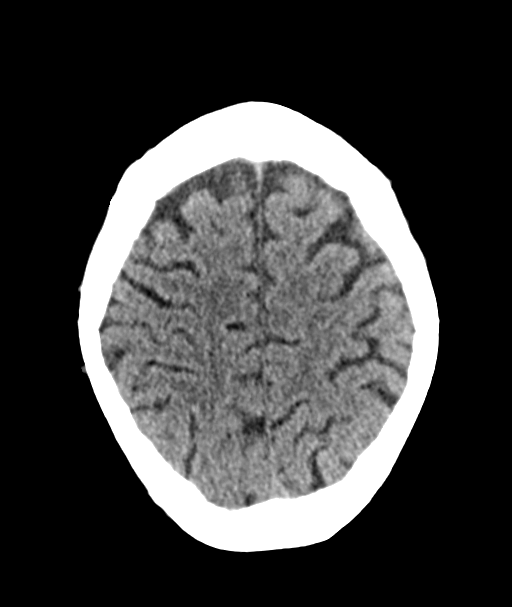
[im 25/31  brain]
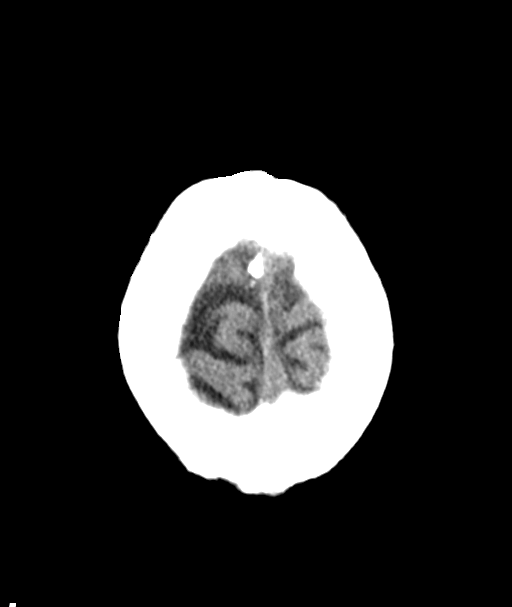
[im 29/31  brain]
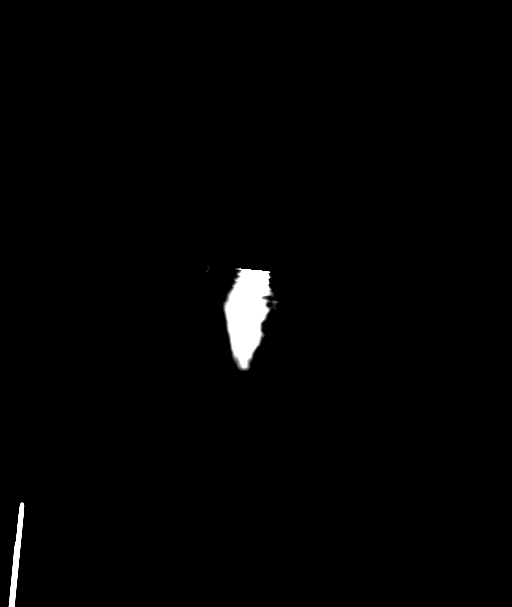

[14 of 47 positions shown; findings below may reference images not displayed]

FINDINGS: Brain: No midline shift, mass effect, or evidence of intracranial
mass lesion. *INSERT* ventriculomegaly No acute intracranial
hemorrhage identified.

Scattered white matter hypodensity, but gray-white matter
differentiation appears stable since 3895. No cortically based acute
infarct identified.

Vascular: Calcified atherosclerosis at the skull base. No suspicious
intracranial vascular hyperdensity. No cortical encephalomalacia

Skull: No acute osseous abnormality identified.

Sinuses/Orbits: Right mastoid effusion is new. The right tympanic
cavity appears to remain clear. Other Visualized paranasal sinuses
and mastoids are stable and well pneumatized.

Other: No acute orbit or scalp soft tissue findings.
IMPRESSION: 1. No acute intracranial abnormality. Stable non contrast CT
appearance of the brain.
2. Right mastoid effusion is new since 3895, probably
postinflammatory.

## 2019-10-14 ENCOUNTER — Other Ambulatory Visit: Payer: Self-pay

## 2019-10-14 ENCOUNTER — Ambulatory Visit (INDEPENDENT_AMBULATORY_CARE_PROVIDER_SITE_OTHER): Payer: Medicare PPO | Admitting: Vascular Surgery

## 2019-10-14 ENCOUNTER — Encounter (INDEPENDENT_AMBULATORY_CARE_PROVIDER_SITE_OTHER): Payer: Self-pay | Admitting: Vascular Surgery

## 2019-10-14 VITALS — BP 112/72 | HR 99 | Ht 73.0 in | Wt 236.0 lb

## 2019-10-14 DIAGNOSIS — I10 Essential (primary) hypertension: Secondary | ICD-10-CM

## 2019-10-14 DIAGNOSIS — E1140 Type 2 diabetes mellitus with diabetic neuropathy, unspecified: Secondary | ICD-10-CM

## 2019-10-14 DIAGNOSIS — I5043 Acute on chronic combined systolic (congestive) and diastolic (congestive) heart failure: Secondary | ICD-10-CM

## 2019-10-14 DIAGNOSIS — M7989 Other specified soft tissue disorders: Secondary | ICD-10-CM | POA: Diagnosis not present

## 2019-10-14 NOTE — Progress Notes (Signed)
MRN : [id number]  Jeremy Herrera is a 79 y.o. (02-Jun-1940) male who presents with chief complaint of  Chief Complaint  Patient presents with  . Follow-up    6mono studies  .  History of Present Illness: Patient returns today in follow up of his leg swelling and previous ulceration.  Compression and elevation of done reasonably good job of keeping his swelling under control.  He still has some swelling but it is fairly mild at this point.  No recurrent ulceration or signs of infection.  Current Outpatient Medications  Medication Sig Dispense Refill  . Acetaminophen 500 MG capsule SMARTSIG:By Mouth    . albuterol (VENTOLIN HFA) 108 (90 Base) MCG/ACT inhaler Inhale 1-2 puffs into the lungs every 4 (four) hours as needed.     .Marland Kitchenaspirin 81 MG tablet Take 81 mg by mouth daily.    . clopidogrel (PLAVIX) 75 MG tablet Take by mouth.     . Cyanocobalamin (B-12) 1000 MCG/ML KIT Inject 1 mL as directed every 30 (thirty) days.    .Marland Kitchendonepezil (ARICEPT) 5 MG tablet Take 5 mg by mouth at bedtime.     . dorzolamide-timolol (COSOPT) 22.3-6.8 MG/ML ophthalmic solution Place 1 drop into both eyes 2 (two) times daily.     .Marland KitchenEMOLLIENT EX Apply topically daily.     .Marland Kitchenesomeprazole (NEXIUM) 20 MG capsule Take 20 mg by mouth daily at 12 noon.    . ferrous sulfate 325 (65 FE) MG tablet Take 325 mg by mouth daily.    . Fluticasone-Salmeterol (ADVAIR) 250-50 MCG/DOSE AEPB Inhale 1 puff into the lungs as needed.     . furosemide (LASIX) 40 MG tablet Take 40 mg by mouth daily.    .Marland Kitchenketoconazole (NIZORAL) 2 % cream Apply 1 application topically daily as needed.     .Marland Kitchenlisinopril (ZESTRIL) 2.5 MG tablet Take 2.5 mg by mouth daily.     . metoprolol succinate (TOPROL-XL) 50 MG 24 hr tablet Take 150 mg by mouth daily.     . nitroGLYCERIN (NITROSTAT) 0.4 MG SL tablet Place 0.4 mg under the tongue every 5 (five) minutes as needed.     . potassium chloride (K-DUR) 10 MEQ tablet Take 10 mEq by mouth daily.     .  sertraline (ZOLOFT) 25 MG tablet Take 25 mg by mouth daily.     . simethicone (MYLICON) 1021MG chewable tablet Chew 125 mg by mouth every 6 (six) hours as needed.     . umeclidinium-vilanterol (ANORO ELLIPTA) 62.5-25 MCG/INH AEPB Inhale 1 puff into the lungs daily as needed. Takes daily    . bimatoprost (LUMIGAN) 0.01 % SOLN 1 drop nightly (Patient not taking: Reported on 10/14/2019)    . Skin Protectants, Misc. (REMEDY PHYTOPLEX HYDRAGUARD) CREA Apply topically. (Patient not taking: Reported on 10/14/2019)     No current facility-administered medications for this visit.    Past Medical History:  Diagnosis Date  . Anemia   . BPH (benign prostatic hyperplasia)   . CHF (congestive heart failure) ([place]   . Diabetes mellitus without complication ([place]   . Dysphagia   . GERD (gastroesophageal reflux disease)   . HLD (hyperlipidemia)   . HTN (hypertension)   . Irregular heart beat   . Nocturia   . Urinary retention     Past Surgical History:  Procedure Laterality Date  . AMPUTATION TOE Left 05/04/2017   Procedure: AMPUTATION TOE - third left toe;  Surgeon: FSamara Deist DPM;  Location: [facility] ORS;  Service: Podiatry;  Laterality: Left;  . bladder patching  1964  . HIP SURGERY Right 2014  . IRRIGATION AND DEBRIDEMENT FOOT Left 11/14/2016   Procedure: IRRIGATION AND DEBRIDEMENT FOOT;  Surgeon: Samara Deist, DPM;  Location: [facility] ORS;  Service: Podiatry;  Laterality: Left;  . TRANSURETHRAL RESECTION OF PROSTATE       Social History   Tobacco Use  . Smoking status: Never Smoker  . Smokeless tobacco: Never Used  Vaping Use  . Vaping Use: Never used  Substance Use Topics  . Alcohol use: No  . Drug use: No      Family History  Problem Relation Age of Onset  . Ulcers Mother        stomach  . GER disease Sister        GERD  . Heart attack Father   . Diabetes Brother   . Depression Brother   . Kidney disease Neg Hx   . Prostate cancer Neg Hx   . Kidney cancer Neg Hx   .  Bladder Cancer Neg Hx      Allergies  Allergen Reactions  . Hydrocodone-Acetaminophen Nausea And Vomiting     REVIEW OF SYSTEMS (Negative unless checked)  Constitutional: []?Weight loss  []?Fever  []?Chills Cardiac: []?Chest pain   []?Chest pressure   []?Palpitations   []?Shortness of breath when laying flat   [x]?Shortness of breath at rest   [x]?Shortness of breath with exertion. Vascular:  [x]?Pain in legs with walking   [x]?Pain in legs at rest   []?Pain in legs when laying flat   []?Claudication   []?Pain in feet when walking  []?Pain in feet at rest  []?Pain in feet when laying flat   []?History of DVT   []?Phlebitis   [x]?Swelling in legs   []?Varicose veins   [x]?Non-healing ulcers Pulmonary:   []?Uses home oxygen   []?Productive cough   []?Hemoptysis   []?Wheeze  []?COPD   []?Asthma Neurologic:  []?Dizziness  []?Blackouts   []?Seizures   []?History of stroke   []?History of TIA  []?Aphasia   []?Temporary blindness   []?Dysphagia   []?Weakness or numbness in arms   []?Weakness or numbness in legs Musculoskeletal:  [x]?Arthritis   []?Joint swelling   [x]?Joint pain   []?Low back pain Hematologic:  []?Easy bruising  []?Easy bleeding   []?Hypercoagulable state   []?Anemic  []?Hepatitis Gastrointestinal:  []?Blood in stool   []?Vomiting blood  [x]?Gastroesophageal reflux/heartburn   []?Abdominal pain Genitourinary:  []?Chronic kidney disease   []?Difficult urination  []?Frequent urination  []?Burning with urination   []?Hematuria Skin:  []?Rashes   [x]?Ulcers   [x]?Wounds Psychological:  []?History of anxiety   []? History of major depression.  Physical Examination  BP 112/72   Pulse 99   Ht 6' 1" (1.854 m)   Wt 236 lb (107 kg)   BMI 31.14 kg/m  Gen:  WD/WN, NAD Head: [place]/AT, No temporalis wasting. Ear/Nose/Throat: Hearing grossly intact, nares w/o erythema or drainage Eyes: Conjunctiva clear. Sclera non-icteric Neck: Supple.  Trachea midline Pulmonary:  Good air movement, no  use of accessory muscles.  Cardiac: RRR, no JVD Vascular:  Vessel Right Left  Radial Palpable Palpable                          PT  1+ palpable  1+ palpable  DP Palpable  1+ palpable   Gastrointestinal: soft, non-tender/non-distended. No guarding/reflex.  Musculoskeletal: M/S 5/5 throughout.  No deformity or atrophy.  Trace to 1+ right lower extremity edema, 1+ left lower extremity edema with mild to moderate stasis dermatitis worse on the left than the right. Neurologic: Sensation grossly intact in extremities.  Symmetrical.  Speech is fluent.  Psychiatric: Judgment intact, Mood & affect appropriate for pt's clinical situation. Dermatologic: No rashes or ulcers noted.  No cellulitis or open wounds.       Labs Recent Results (from the past 2160 hour(s))  CBC with Differential/Platelet     Status: None   Collection Time: 08/20/19  1:39 PM  Result Value Ref Range   WBC 6.4 4.0 - 10.5 K/uL   RBC 4.45 4.22 - 5.81 MIL/uL   Hemoglobin 13.8 13.0 - 17.0 g/dL   HCT 40.5 39 - 52 %   MCV 91.0 80.0 - 100.0 fL   MCH 31.0 26.0 - 34.0 pg   MCHC 34.1 30.0 - 36.0 g/dL   RDW 13.7 11.5 - 15.5 %   Platelets 150 150 - 400 K/uL   nRBC 0.0 0.0 - 0.2 %   Neutrophils Relative % 59 %   Neutro Abs 3.7 1.7 - 7.7 K/uL   Lymphocytes Relative 31 %   Lymphs Abs 2.0 0.7 - 4.0 K/uL   Monocytes Relative 6 %   Monocytes Absolute 0.4 0 - 1 K/uL   Eosinophils Relative 3 %   Eosinophils Absolute 0.2 0 - 0 K/uL   Basophils Relative 1 %   Basophils Absolute 0.1 0 - 0 K/uL   Immature Granulocytes 0 %   Abs Immature Granulocytes 0.02 0.00 - 0.07 K/uL    Comment: Performed at [facility], [address], [place], [place] [address]  Ferritin     Status: None   Collection Time: 08/20/19  1:39 PM  Result Value Ref Range   Ferritin 125 24 - 336 ng/mL    Comment: Performed at [facility], [address] [place], [place] [address]  Comprehensive metabolic panel     Status: Abnormal    Collection Time: 08/20/19  1:39 PM  Result Value Ref Range   Sodium 138 135 - 145 mmol/L   Potassium 4.5 3.5 - 5.1 mmol/L   Chloride 107 98 - 111 mmol/L   CO2 24 22 - 32 mmol/L   Glucose, Bld 180 (H) 70 - 99 mg/dL    Comment: Glucose reference range applies only to samples taken after fasting for at least 8 hours.   BUN 36 (H) 8 - 23 mg/dL   Creatinine, Ser 1.76 (H) 0.61 - 1.24 mg/dL   Calcium 8.3 (L) 8.9 - 10.3 mg/dL   Total Protein 6.8 6.5 - 8.1 g/dL   Albumin 3.8 3.5 - 5.0 g/dL   AST 19 15 - 41 U/L   ALT 14 0 - 44 U/L   Alkaline Phosphatase 105 38 - 126 U/L   Total Bilirubin 1.0 0.3 - 1.2 mg/dL   GFR calc non Af Amer 36 (L) >60 mL/min   GFR calc Af Amer 41 (L) >60 mL/min   Anion gap 7 5 - 15    Comment: Performed at [facility], [address], [place], [place] [address]    Radiology No results found.  Assessment/Plan Benign essential HTN blood pressure control important in reducing the progression of atherosclerotic disease. On appropriate oral medications.   Diabetes mellitus, type 2 blood glucose control important in reducing the progression of atherosclerotic disease. Also, involved in wound healing. On appropriate medications.  Acute on chronic combined systolic and diastolic heart failure (HCC) Chronic heart and pulmonary issues would certainly worsen lower extremity swelling  Swelling of limb Currently under reasonably good control with compression and elevation.  No recurrent ulcerations.  Continue current measures.  He remains on Lasix for his heart and kidney issues as well.  Plan to recheck in about 6 months.    Leotis Pain, MD  10/14/2019 11:07 AM    This note was created with Dragon medical transcription system.  Any errors from dictation are purely unintentional

## 2019-10-14 NOTE — Assessment & Plan Note (Signed)
Currently under reasonably good control with compression and elevation.  No recurrent ulcerations.  Continue current measures.  He remains on Lasix for his heart and kidney issues as well.  Plan to recheck in about 6 months.

## 2019-10-15 ENCOUNTER — Inpatient Hospital Stay: Payer: Medicare PPO | Attending: Hematology and Oncology

## 2019-10-15 DIAGNOSIS — E538 Deficiency of other specified B group vitamins: Secondary | ICD-10-CM

## 2019-10-15 MED ORDER — CYANOCOBALAMIN 1000 MCG/ML IJ SOLN
1000.0000 ug | Freq: Once | INTRAMUSCULAR | Status: AC
  Administered 2019-10-15: 1000 ug via INTRAMUSCULAR
  Filled 2019-10-15: qty 1

## 2019-11-05 DIAGNOSIS — I350 Nonrheumatic aortic (valve) stenosis: Secondary | ICD-10-CM | POA: Insufficient documentation

## 2019-11-19 ENCOUNTER — Other Ambulatory Visit: Payer: Self-pay

## 2019-11-19 ENCOUNTER — Inpatient Hospital Stay: Payer: Medicare PPO | Attending: Hematology and Oncology

## 2019-11-19 DIAGNOSIS — E538 Deficiency of other specified B group vitamins: Secondary | ICD-10-CM

## 2019-11-19 MED ORDER — CYANOCOBALAMIN 1000 MCG/ML IJ SOLN
1000.0000 ug | Freq: Once | INTRAMUSCULAR | Status: AC
  Administered 2019-11-19: 1000 ug via INTRAMUSCULAR

## 2019-11-19 MED ORDER — CYANOCOBALAMIN 1000 MCG/ML IJ SOLN
INTRAMUSCULAR | Status: AC
  Filled 2019-11-19: qty 1

## 2019-12-17 ENCOUNTER — Inpatient Hospital Stay: Payer: Medicare PPO

## 2019-12-18 ENCOUNTER — Other Ambulatory Visit: Payer: Self-pay

## 2019-12-18 ENCOUNTER — Inpatient Hospital Stay: Payer: Medicare PPO | Attending: Hematology and Oncology

## 2019-12-18 DIAGNOSIS — E538 Deficiency of other specified B group vitamins: Secondary | ICD-10-CM | POA: Insufficient documentation

## 2019-12-18 MED ORDER — CYANOCOBALAMIN 1000 MCG/ML IJ SOLN
INTRAMUSCULAR | Status: AC
  Filled 2019-12-18: qty 1

## 2019-12-18 MED ORDER — CYANOCOBALAMIN 1000 MCG/ML IJ SOLN
1000.0000 ug | Freq: Once | INTRAMUSCULAR | Status: AC
  Administered 2019-12-18: 1000 ug via INTRAMUSCULAR

## 2020-01-14 ENCOUNTER — Inpatient Hospital Stay: Payer: Medicare PPO

## 2020-01-19 ENCOUNTER — Inpatient Hospital Stay: Payer: Medicare PPO | Attending: Hematology and Oncology

## 2020-01-19 ENCOUNTER — Other Ambulatory Visit: Payer: Self-pay

## 2020-01-19 DIAGNOSIS — E538 Deficiency of other specified B group vitamins: Secondary | ICD-10-CM | POA: Insufficient documentation

## 2020-01-19 MED ORDER — CYANOCOBALAMIN 1000 MCG/ML IJ SOLN
INTRAMUSCULAR | Status: AC
  Filled 2020-01-19: qty 1

## 2020-01-19 MED ORDER — CYANOCOBALAMIN 1000 MCG/ML IJ SOLN
1000.0000 ug | Freq: Once | INTRAMUSCULAR | Status: AC
  Administered 2020-01-19: 1000 ug via INTRAMUSCULAR

## 2020-02-03 ENCOUNTER — Other Ambulatory Visit: Payer: Self-pay

## 2020-02-03 ENCOUNTER — Ambulatory Visit (INDEPENDENT_AMBULATORY_CARE_PROVIDER_SITE_OTHER): Payer: Medicare PPO | Admitting: Nurse Practitioner

## 2020-02-03 ENCOUNTER — Encounter (INDEPENDENT_AMBULATORY_CARE_PROVIDER_SITE_OTHER): Payer: Self-pay | Admitting: Nurse Practitioner

## 2020-02-03 VITALS — BP 90/56 | HR 98 | Resp 16 | Wt 233.0 lb

## 2020-02-03 DIAGNOSIS — L97222 Non-pressure chronic ulcer of left calf with fat layer exposed: Secondary | ICD-10-CM

## 2020-02-03 DIAGNOSIS — I10 Essential (primary) hypertension: Secondary | ICD-10-CM

## 2020-02-03 DIAGNOSIS — L03116 Cellulitis of left lower limb: Secondary | ICD-10-CM | POA: Diagnosis not present

## 2020-02-03 DIAGNOSIS — E785 Hyperlipidemia, unspecified: Secondary | ICD-10-CM

## 2020-02-03 MED ORDER — CEPHALEXIN 500 MG PO CAPS: 500.0000 mg | ORAL_CAPSULE | Freq: Four times a day (QID) | ORAL | 0 refills | Status: DC

## 2020-02-04 ENCOUNTER — Telehealth (INDEPENDENT_AMBULATORY_CARE_PROVIDER_SITE_OTHER): Payer: Self-pay

## 2020-02-04 ENCOUNTER — Encounter (INDEPENDENT_AMBULATORY_CARE_PROVIDER_SITE_OTHER): Payer: Self-pay | Admitting: Nurse Practitioner

## 2020-02-04 NOTE — Telephone Encounter (Signed)
Patient caretaker informed me yesterday that the patient receives services by [facility] but they are not able to do wound care. I have sent over a referral to [facility] home health for left leg unna wrap to be changed weekly.

## 2020-02-04 NOTE — Progress Notes (Signed)
Subjective:    Patient ID: Jeremy Herrera, male    DOB: 01-26-1941, 79 y.o.   MRN: [id number] Chief Complaint  Patient presents with  . Follow-up    Est pt lle swelling and scabbed over    Patient presents today for evaluation of lower extremity edema redness and scabbed over ulceration.  The patient notes that he had a blister that ruptured and scabbed over.  He notes that the blister was not terribly painful however he began to have redness and worsening swelling in his left lower extremity.  Initially the patient was a concern but once his family saw the wound they urged him to be seen for an office visit.  The patient does have a previous history of venous ulcerations and lymphedema.  The patient does attempt to wear compression on a regular basis.  He denies any fever, chills, nausea, vomiting or diarrhea.  The wound is slightly painful to the touch and erythematous.  He denies any weeping from the wound.  There are several small ulcerations that are not quite scabbed over.   Review of Systems  Cardiovascular: Positive for leg swelling.  Skin: Positive for wound.  All other systems reviewed and are negative.      Objective:   Physical Exam Vitals reviewed.  HENT:     Head: Normocephalic.  Cardiovascular:     Rate and Rhythm: Normal rate.     Pulses: Normal pulses.  Pulmonary:     Effort: Pulmonary effort is normal.  Musculoskeletal:        General: Normal range of motion.     Left lower leg: 2+ Pitting Edema present.  Skin:    General: Skin is warm and dry.     Findings: Erythema present.  Neurological:     Mental Status: He is alert and oriented to person, place, and time.  Psychiatric:        Mood and Affect: Mood normal.        Behavior: Behavior normal.        Thought Content: Thought content normal.        Judgment: Judgment normal.     BP (!) 90/56 (BP Location: Right Arm)   Pulse 98   Resp 16   Wt 233 lb (105.7 kg)   BMI 30.74 kg/m   Past Medical  History:  Diagnosis Date  . Anemia   . BPH (benign prostatic hyperplasia)   . CHF (congestive heart failure) ([place])   . Diabetes mellitus without complication ([place])   . Dysphagia   . GERD (gastroesophageal reflux disease)   . HLD (hyperlipidemia)   . HTN (hypertension)   . Irregular heart beat   . Nocturia   . Urinary retention     Social History   Socioeconomic History  . Marital status: Widowed    Spouse name: Not on file  . Number of children: Not on file  . Years of education: Not on file  . Highest education level: Not on file  Occupational History  . Not on file  Tobacco Use  . Smoking status: Never Smoker  . Smokeless tobacco: Never Used  Vaping Use  . Vaping Use: Never used  Substance and Sexual Activity  . Alcohol use: No  . Drug use: No  . Sexual activity: Not Currently  Other Topics Concern  . Not on file  Social History Narrative  . Not on file   Social Determinants of Health   Financial Resource Strain: Not on file  Food Insecurity: Not on file  Transportation Needs: Not on file  Physical Activity: Not on file  Stress: Not on file  Social Connections: Not on file  Intimate Partner Violence: Not on file    Past Surgical History:  Procedure Laterality Date  . AMPUTATION TOE Left 05/04/2017   Procedure: AMPUTATION TOE - third left toe;  Surgeon: Samara Deist, DPM;  Location: [facility] ORS;  Service: Podiatry;  Laterality: Left;  . bladder patching  1964  . HIP SURGERY Right 2014  . IRRIGATION AND DEBRIDEMENT FOOT Left 11/14/2016   Procedure: IRRIGATION AND DEBRIDEMENT FOOT;  Surgeon: Samara Deist, DPM;  Location: [facility] ORS;  Service: Podiatry;  Laterality: Left;  . TRANSURETHRAL RESECTION OF PROSTATE      Family History  Problem Relation Age of Onset  . Ulcers Mother        stomach  . GER disease Sister        GERD  . Heart attack Father   . Diabetes Brother   . Depression Brother   . Kidney disease Neg Hx   . Prostate cancer Neg Hx   .  Kidney cancer Neg Hx   . Bladder Cancer Neg Hx     Allergies  Allergen Reactions  . Hydrocodone-Acetaminophen Nausea And Vomiting    CBC Latest Ref Rng & Units 08/20/2019 05/28/2019 05/16/2019  WBC 4.0 - 10.5 K/uL 6.4 5.9 8.5  Hemoglobin 13.0 - 17.0 g/dL 13.8 12.4(L) 13.6  Hematocrit 39.0 - 52.0 % 40.5 37.3(L) 40.6  Platelets 150 - 400 K/uL 150 141(L) 128(L)      CMP     Component Value Date/Time   NA 138 08/20/2019 1339   NA 139 04/01/2013 0728   K 4.5 08/20/2019 1339   K 4.1 04/01/2013 0728   CL 107 08/20/2019 1339   CL 109 (H) 04/01/2013 0728   CO2 24 08/20/2019 1339   CO2 27 04/01/2013 0728   GLUCOSE 180 (H) 08/20/2019 1339   GLUCOSE 98 04/01/2013 0728   BUN 36 (H) 08/20/2019 1339   BUN 13 04/01/2013 0728   CREATININE 1.76 (H) 08/20/2019 1339   CREATININE 1.07 04/01/2013 0728   CALCIUM 8.3 (L) 08/20/2019 1339   CALCIUM 8.4 (L) 04/01/2013 0728   PROT 6.8 08/20/2019 1339   PROT 7.2 12/09/2011 2143   ALBUMIN 3.8 08/20/2019 1339   ALBUMIN 3.8 12/09/2011 2143   AST 19 08/20/2019 1339   AST 20 12/09/2011 2143   ALT 14 08/20/2019 1339   ALT 15 12/09/2011 2143   ALKPHOS 105 08/20/2019 1339   ALKPHOS 147 (H) 12/09/2011 2143   BILITOT 1.0 08/20/2019 1339   BILITOT 0.6 12/09/2011 2143   GFRNONAA 36 (L) 08/20/2019 1339   GFRNONAA >60 04/01/2013 0728   GFRAA 41 (L) 08/20/2019 1339   GFRAA >60 04/01/2013 0728     No results found.     Assessment & Plan:   1. Lower limb ulcer, calf, left, with fat layer exposed ([place]) No surgery or intervention at this point in time.    I have had a long discussion with the patient regarding venous insufficiency and why it  causes symptoms, specifically venous ulceration . I have discussed with the patient the chronic skin changes that accompany venous insufficiency and the long term sequela such as infection and recurring  ulceration.  Patient will be placed in [employer] which will be changed weekly drainage permitting.  In  addition, behavioral modification including several periods of elevation of the lower extremities during the  day will be continued. Achieving a position with the ankles at heart level was stressed to the patient  The patient is instructed to begin routine exercise, especially walking on a daily basis  The patient will return to the office in 4 weeks for evaluation of wound healing.   2. Benign essential HTN Continue antihypertensive medications as already ordered, these medications have been reviewed and there are no changes at this time.   3. Hyperlipidemia, unspecified hyperlipidemia type Continue statin as ordered and reviewed, no changes at this time   4. Cellulitis of left lower extremity We will send in Keflex to treat the likely cellulitis that is present. - cephALEXin (KEFLEX) 500 MG capsule; Take 1 capsule (500 mg total) by mouth 4 (four) times daily.  Dispense: 28 capsule; Refill: 0   Current Outpatient Medications on File Prior to Visit  Medication Sig Dispense Refill  . Acetaminophen 500 MG capsule SMARTSIG:By Mouth    . albuterol (VENTOLIN HFA) 108 (90 Base) MCG/ACT inhaler Inhale 1-2 puffs into the lungs every 4 (four) hours as needed.     Marland Kitchen aspirin 81 MG tablet Take 81 mg by mouth daily.    . bimatoprost (LUMIGAN) 0.01 % SOLN 1 drop nightly    . Cyanocobalamin (B-12) 1000 MCG/ML KIT Inject 1 mL as directed every 30 (thirty) days.    Marland Kitchen donepezil (ARICEPT) 5 MG tablet Take 5 mg by mouth at bedtime.     . dorzolamide-timolol (COSOPT) 22.3-6.8 MG/ML ophthalmic solution Place 1 drop into both eyes 2 (two) times daily.     Marland Kitchen EMOLLIENT EX Apply topically daily.     Marland Kitchen esomeprazole (NEXIUM) 20 MG capsule Take 20 mg by mouth daily at 12 noon.    . ferrous sulfate 325 (65 FE) MG tablet Take 325 mg by mouth daily.    . Fluticasone-Salmeterol (ADVAIR) 250-50 MCG/DOSE AEPB Inhale 1 puff into the lungs as needed.     . furosemide (LASIX) 40 MG tablet Take 40 mg by mouth daily.    Marland Kitchen  ketoconazole (NIZORAL) 2 % cream Apply 1 application topically daily as needed.     Marland Kitchen lisinopril (ZESTRIL) 2.5 MG tablet Take 2.5 mg by mouth daily.     . metoprolol succinate (TOPROL-XL) 50 MG 24 hr tablet Take 150 mg by mouth daily.     . nitroGLYCERIN (NITROSTAT) 0.4 MG SL tablet Place 0.4 mg under the tongue every 5 (five) minutes as needed.     . potassium chloride (K-DUR) 10 MEQ tablet Take 10 mEq by mouth daily.     . sertraline (ZOLOFT) 25 MG tablet Take 25 mg by mouth daily.     . simethicone (MYLICON) 681 MG chewable tablet Chew 125 mg by mouth every 6 (six) hours as needed.     . umeclidinium-vilanterol (ANORO ELLIPTA) 62.5-25 MCG/INH AEPB Inhale 1 puff into the lungs daily as needed. Takes daily    . clopidogrel (PLAVIX) 75 MG tablet Take by mouth.  (Patient not taking: Reported on 02/03/2020)    . Skin Protectants, Misc. (REMEDY PHYTOPLEX HYDRAGUARD) CREA Apply topically. (Patient not taking: No sig reported)     No current facility-administered medications on file prior to visit.    There are no Patient Instructions on file for this visit. No follow-ups on file.   Kris Hartmann, NP

## 2020-02-09 ENCOUNTER — Telehealth (INDEPENDENT_AMBULATORY_CARE_PROVIDER_SITE_OTHER): Payer: Self-pay

## 2020-02-09 NOTE — Telephone Encounter (Signed)
Patient daughter stated that her father is taking antibiotics as prescribed

## 2020-02-09 NOTE — Telephone Encounter (Signed)
If the patient is having blisters form above his knee,  I suspect he may be a bit fluid overloaded, in which case urgent care may be helpful or his PCP for adjustment of his diuretics.  As far as antibiotics, the patient should still be on antibiotics that he was prescribed a week ago.  The questions I have are, is he taking them and is he taking them as prescribed?

## 2020-02-10 ENCOUNTER — Encounter (INDEPENDENT_AMBULATORY_CARE_PROVIDER_SITE_OTHER): Payer: Self-pay | Admitting: Nurse Practitioner

## 2020-02-10 ENCOUNTER — Ambulatory Visit (INDEPENDENT_AMBULATORY_CARE_PROVIDER_SITE_OTHER): Payer: Medicare PPO | Admitting: Nurse Practitioner

## 2020-02-10 ENCOUNTER — Other Ambulatory Visit: Payer: Self-pay

## 2020-02-10 VITALS — BP 116/80 | HR 125

## 2020-02-10 DIAGNOSIS — I5023 Acute on chronic systolic (congestive) heart failure: Secondary | ICD-10-CM

## 2020-02-10 DIAGNOSIS — L03115 Cellulitis of right lower limb: Secondary | ICD-10-CM

## 2020-02-10 DIAGNOSIS — L97222 Non-pressure chronic ulcer of left calf with fat layer exposed: Secondary | ICD-10-CM

## 2020-02-10 MED ORDER — AMOXICILLIN-POT CLAVULANATE 875-125 MG PO TABS: 1.0000 | ORAL_TABLET | Freq: Two times a day (BID) | ORAL | 0 refills | Status: DC

## 2020-02-10 NOTE — Progress Notes (Signed)
Subjective:    Patient ID: Jeremy Herrera, male    DOB: [birth year], 79 y.o.   MRN: [id number] Chief Complaint  Patient presents with  . Follow-up    Left leg wound check     The patient presents today approximately 1 week from his previous visit with concern from the family in regards to lower extremity blisters.  The patient's daughter notes that she removed his wraps and shortly thereafter he developed blisters on his lower extremities.  These blisters ruptured and had foul-smelling odor.  The patient also notes that while his left leg was previously wrapped today his right leg is very painful and sore.  There appears to be some drainage from the leg with evidence of cellulitis.  He notes that this area is very painful to the touch.  The patient also endorses being more short of breath than normal.  He notes that he has been taking all his medications as previously prescribed.  He currently denies any chest pain.   Review of Systems  Respiratory: Positive for shortness of breath.   Skin: Positive for wound.  Neurological: Positive for weakness.  All other systems reviewed and are negative.      Objective:   Physical Exam Vitals reviewed.  HENT:     Head: Normocephalic.  Cardiovascular:     Rate and Rhythm: Tachycardia present. Rhythm irregular.     Pulses: Normal pulses.  Pulmonary:     Effort: Tachypnea present.  Musculoskeletal:     Right lower leg: 2+ Pitting Edema present.     Left lower leg: 2+ Pitting Edema present.  Skin:    Findings: Erythema present.  Neurological:     Mental Status: He is alert and oriented to person, place, and time.  Psychiatric:        Mood and Affect: Mood normal.        Behavior: Behavior normal.        Thought Content: Thought content normal.        Judgment: Judgment normal.     BP 116/80 (BP Location: Right Arm)   Pulse (!) 125   Past Medical History:  Diagnosis Date  . Anemia   . BPH (benign prostatic hyperplasia)   . CHF  (congestive heart failure) ([place])   . Diabetes mellitus without complication ([place])   . Dysphagia   . GERD (gastroesophageal reflux disease)   . HLD (hyperlipidemia)   . HTN (hypertension)   . Irregular heart beat   . Nocturia   . Urinary retention     Social History   Socioeconomic History  . Marital status: Widowed    Spouse name: Not on file  . Number of children: Not on file  . Years of education: Not on file  . Highest education level: Not on file  Occupational History  . Not on file  Tobacco Use  . Smoking status: Never Smoker  . Smokeless tobacco: Never Used  Vaping Use  . Vaping Use: Never used  Substance and Sexual Activity  . Alcohol use: No  . Drug use: No  . Sexual activity: Not Currently  Other Topics Concern  . Not on file  Social History Narrative  . Not on file   Social Determinants of Health   Financial Resource Strain: Not on file  Food Insecurity: Not on file  Transportation Needs: Not on file  Physical Activity: Not on file  Stress: Not on file  Social Connections: Not on file  Intimate Partner Violence:  Not on file    Past Surgical History:  Procedure Laterality Date  . AMPUTATION TOE Left 05/04/2017   Procedure: AMPUTATION TOE - third left toe;  Surgeon: Samara Deist, DPM;  Location: [facility] ORS;  Service: Podiatry;  Laterality: Left;  . bladder patching  1964  . HIP SURGERY Right 2014  . IRRIGATION AND DEBRIDEMENT FOOT Left 11/14/2016   Procedure: IRRIGATION AND DEBRIDEMENT FOOT;  Surgeon: Samara Deist, DPM;  Location: [facility] ORS;  Service: Podiatry;  Laterality: Left;  . TRANSURETHRAL RESECTION OF PROSTATE      Family History  Problem Relation Age of Onset  . Ulcers Mother        stomach  . GER disease Sister        GERD  . Heart attack Father   . Diabetes Brother   . Depression Brother   . Kidney disease Neg Hx   . Prostate cancer Neg Hx   . Kidney cancer Neg Hx   . Bladder Cancer Neg Hx     Allergies  Allergen Reactions  .  Hydrocodone-Acetaminophen Nausea And Vomiting    CBC Latest Ref Rng & Units 08/20/2019 05/28/2019 05/16/2019  WBC 4.0 - 10.5 K/uL 6.4 5.9 8.5  Hemoglobin 13.0 - 17.0 g/dL 13.8 12.4(L) 13.6  Hematocrit 39.0 - 52.0 % 40.5 37.3(L) 40.6  Platelets 150 - 400 K/uL 150 141(L) 128(L)      CMP     Component Value Date/Time   NA 138 08/20/2019 1339   NA 139 04/01/2013 0728   K 4.5 08/20/2019 1339   K 4.1 04/01/2013 0728   CL 107 08/20/2019 1339   CL 109 (H) 04/01/2013 0728   CO2 24 08/20/2019 1339   CO2 27 04/01/2013 0728   GLUCOSE 180 (H) 08/20/2019 1339   GLUCOSE 98 04/01/2013 0728   BUN 36 (H) 08/20/2019 1339   BUN 13 04/01/2013 0728   CREATININE 1.76 (H) 08/20/2019 1339   CREATININE 1.07 04/01/2013 0728   CALCIUM 8.3 (L) 08/20/2019 1339   CALCIUM 8.4 (L) 04/01/2013 0728   PROT 6.8 08/20/2019 1339   PROT 7.2 12/09/2011 2143   ALBUMIN 3.8 08/20/2019 1339   ALBUMIN 3.8 12/09/2011 2143   AST 19 08/20/2019 1339   AST 20 12/09/2011 2143   ALT 14 08/20/2019 1339   ALT 15 12/09/2011 2143   ALKPHOS 105 08/20/2019 1339   ALKPHOS 147 (H) 12/09/2011 2143   BILITOT 1.0 08/20/2019 1339   BILITOT 0.6 12/09/2011 2143   GFRNONAA 36 (L) 08/20/2019 1339   GFRNONAA >60 04/01/2013 0728   GFRAA 41 (L) 08/20/2019 1339   GFRAA >60 04/01/2013 0728     No results found.     Assessment & Plan:   1. Lower limb ulcer, calf, left, with fat layer exposed ([place]) We will have the patient placed in bilateral Unna wraps.  He is currently getting home health with wraps and so we will have them contact us if there is signs symptoms of deterioration of wound progression.  We will plan on having his wraps changed on a weekly basis.  The family is advised that if sooner issues arise to contact her office and we will have him return sooner than his previously scheduled 4-week follow-up.  2. Cellulitis of right lower extremity The patient still has Keflex left and I am slightly suspicious that it has not been  taking as prescribed.  We will change the patient's antibiotic to Augmentin, as this may be better for compliance.  We will also  continue to have the patient in wraps to help control the swelling which will also help with healing the cellulitis.  3. Acute on chronic systolic CHF (congestive heart failure) ([place]) The patient appears to be getting fluid blisters anytime he is not within wraps which is concerning for possible fluid overload.  Was also more so concerning is the patient's tachycardia and labored breathing even when he is at rest.  This is a stark contrast from when he was in our office a week ago.  Out of an abundance of caution we have coordinated with his cardiologist office for expedited follow-up and evaluation.   Current Outpatient Medications on File Prior to Visit  Medication Sig Dispense Refill  . Acetaminophen 500 MG capsule SMARTSIG:By Mouth    . albuterol (VENTOLIN HFA) 108 (90 Base) MCG/ACT inhaler Inhale 1-2 puffs into the lungs every 4 (four) hours as needed.     Marland Kitchen aspirin 81 MG tablet Take 81 mg by mouth daily.    . bimatoprost (LUMIGAN) 0.01 % SOLN 1 drop nightly    . Cyanocobalamin (B-12) 1000 MCG/ML KIT Inject 1 mL as directed every 30 (thirty) days.    Marland Kitchen donepezil (ARICEPT) 5 MG tablet Take 5 mg by mouth at bedtime.     . dorzolamide-timolol (COSOPT) 22.3-6.8 MG/ML ophthalmic solution Place 1 drop into both eyes 2 (two) times daily.     Marland Kitchen EMOLLIENT EX Apply topically daily.     Marland Kitchen esomeprazole (NEXIUM) 20 MG capsule Take 20 mg by mouth daily at 12 noon.    . ferrous sulfate 325 (65 FE) MG tablet Take 325 mg by mouth daily.    . Fluticasone-Salmeterol (ADVAIR) 250-50 MCG/DOSE AEPB Inhale 1 puff into the lungs as needed.     . furosemide (LASIX) 40 MG tablet Take 40 mg by mouth daily.    Marland Kitchen ketoconazole (NIZORAL) 2 % cream Apply 1 application topically daily as needed.     Marland Kitchen lisinopril (ZESTRIL) 2.5 MG tablet Take 2.5 mg by mouth daily.     . metoprolol succinate  (TOPROL-XL) 50 MG 24 hr tablet Take 150 mg by mouth daily.     . nitroGLYCERIN (NITROSTAT) 0.4 MG SL tablet Place 0.4 mg under the tongue every 5 (five) minutes as needed.     . potassium chloride (K-DUR) 10 MEQ tablet Take 10 mEq by mouth daily.     . sertraline (ZOLOFT) 25 MG tablet Take 25 mg by mouth daily.     . simethicone (MYLICON) 827 MG chewable tablet Chew 125 mg by mouth every 6 (six) hours as needed.     . umeclidinium-vilanterol (ANORO ELLIPTA) 62.5-25 MCG/INH AEPB Inhale 1 puff into the lungs daily as needed. Takes daily    . clopidogrel (PLAVIX) 75 MG tablet Take by mouth.  (Patient not taking: No sig reported)    . Skin Protectants, Misc. (REMEDY PHYTOPLEX HYDRAGUARD) CREA Apply topically. (Patient not taking: No sig reported)     No current facility-administered medications on file prior to visit.    There are no Patient Instructions on file for this visit. No follow-ups on file.   Kris Hartmann, NP

## 2020-02-11 ENCOUNTER — Encounter (INDEPENDENT_AMBULATORY_CARE_PROVIDER_SITE_OTHER): Payer: Self-pay | Admitting: Nurse Practitioner

## 2020-02-18 ENCOUNTER — Inpatient Hospital Stay: Payer: Medicare PPO | Attending: Hematology and Oncology

## 2020-02-18 ENCOUNTER — Inpatient Hospital Stay: Payer: Medicare PPO

## 2020-02-18 ENCOUNTER — Other Ambulatory Visit: Payer: Self-pay

## 2020-02-18 DIAGNOSIS — D509 Iron deficiency anemia, unspecified: Secondary | ICD-10-CM | POA: Diagnosis present

## 2020-02-18 DIAGNOSIS — E538 Deficiency of other specified B group vitamins: Secondary | ICD-10-CM | POA: Insufficient documentation

## 2020-02-18 LAB — CBC WITH DIFFERENTIAL/PLATELET
Abs Immature Granulocytes: 0.04 10*3/uL (ref 0.00–0.07)
Basophils Absolute: 0.1 10*3/uL (ref 0.0–0.1)
Basophils Relative: 1 %
Eosinophils Absolute: 0.2 10*3/uL (ref 0.0–0.5)
Eosinophils Relative: 4 %
HCT: 40.3 % (ref 39.0–52.0)
Hemoglobin: 13.5 g/dL (ref 13.0–17.0)
Immature Granulocytes: 1 %
Lymphocytes Relative: 27 %
Lymphs Abs: 1.8 10*3/uL (ref 0.7–4.0)
MCH: 31 pg (ref 26.0–34.0)
MCHC: 33.5 g/dL (ref 30.0–36.0)
MCV: 92.4 fL (ref 80.0–100.0)
Monocytes Absolute: 0.5 10*3/uL (ref 0.1–1.0)
Monocytes Relative: 8 %
Neutro Abs: 3.9 10*3/uL (ref 1.7–7.7)
Neutrophils Relative %: 59 %
Platelets: 160 10*3/uL (ref 150–400)
RBC: 4.36 MIL/uL (ref 4.22–5.81)
RDW: 13.7 % (ref 11.5–15.5)
WBC: 6.5 10*3/uL (ref 4.0–10.5)
nRBC: 0 % (ref 0.0–0.2)

## 2020-02-18 LAB — FOLATE: Folate: 6.6 ng/mL (ref >5.9)

## 2020-02-18 LAB — FERRITIN: Ferritin: 151 ng/mL (ref 24–336)

## 2020-02-18 MED ORDER — CYANOCOBALAMIN 1000 MCG/ML IJ SOLN
1000.0000 ug | Freq: Once | INTRAMUSCULAR | Status: AC
  Administered 2020-02-18: 14:00:00 1000 ug via INTRAMUSCULAR
  Filled 2020-02-18: qty 1

## 2020-02-18 NOTE — Progress Notes (Signed)
Pt received b12 injection in clinic today. Tolerated well. 

## 2020-02-24 ENCOUNTER — Encounter (INDEPENDENT_AMBULATORY_CARE_PROVIDER_SITE_OTHER): Payer: Self-pay

## 2020-02-24 ENCOUNTER — Other Ambulatory Visit: Payer: Self-pay

## 2020-02-24 ENCOUNTER — Ambulatory Visit (INDEPENDENT_AMBULATORY_CARE_PROVIDER_SITE_OTHER): Payer: Medicare PPO | Admitting: Vascular Surgery

## 2020-02-24 VITALS — BP 104/75 | HR 111 | Resp 20 | Ht 73.0 in | Wt 228.0 lb

## 2020-02-24 DIAGNOSIS — I5043 Acute on chronic combined systolic (congestive) and diastolic (congestive) heart failure: Secondary | ICD-10-CM

## 2020-02-24 DIAGNOSIS — R600 Localized edema: Secondary | ICD-10-CM | POA: Diagnosis not present

## 2020-02-24 DIAGNOSIS — E1140 Type 2 diabetes mellitus with diabetic neuropathy, unspecified: Secondary | ICD-10-CM

## 2020-02-24 DIAGNOSIS — L97222 Non-pressure chronic ulcer of left calf with fat layer exposed: Secondary | ICD-10-CM | POA: Diagnosis not present

## 2020-02-24 DIAGNOSIS — M7989 Other specified soft tissue disorders: Secondary | ICD-10-CM

## 2020-02-24 DIAGNOSIS — I10 Essential (primary) hypertension: Secondary | ICD-10-CM

## 2020-02-24 NOTE — Assessment & Plan Note (Signed)
Much better with Unna boots.  Return to come out of Unna boots today and just go to his compression socks or wraps.  Recheck in 1 month.

## 2020-02-24 NOTE — Progress Notes (Signed)
MRN : [id number]  Jeremy Herrera is a 80 y.o. ([birth year]) male who presents with chief complaint of  Chief Complaint  Patient presents with  . Follow-up    Unna boot check  .  History of Present Illness: Patient returns today in follow up of his leg swelling and ulceration.  He has been in [employer] for several weeks.  Home health has been doing these.  His ulcers have now healed.  His swelling is gone back to varies mild at this point.  His legs feel fine.  No fevers or chills.  No signs of infection.  Current Outpatient Medications  Medication Sig Dispense Refill  . Acetaminophen 500 MG capsule SMARTSIG:By Mouth    . albuterol (VENTOLIN HFA) 108 (90 Base) MCG/ACT inhaler Inhale 1-2 puffs into the lungs every 4 (four) hours as needed.     Marland Kitchen amoxicillin-clavulanate (AUGMENTIN) 875-125 MG tablet Take 1 tablet by mouth 2 (two) times daily. 20 tablet 0  . aspirin 81 MG tablet Take 81 mg by mouth daily.    . bimatoprost (LUMIGAN) 0.01 % SOLN 1 drop nightly    . clopidogrel (PLAVIX) 75 MG tablet Take by mouth.    . Cyanocobalamin (B-12) 1000 MCG/ML KIT Inject 1 mL as directed every 30 (thirty) days.    Marland Kitchen donepezil (ARICEPT) 5 MG tablet Take 5 mg by mouth at bedtime.     . dorzolamide-timolol (COSOPT) 22.3-6.8 MG/ML ophthalmic solution Place 1 drop into both eyes 2 (two) times daily.     Marland Kitchen EMOLLIENT EX Apply topically daily.     Marland Kitchen esomeprazole (NEXIUM) 20 MG capsule Take 20 mg by mouth daily at 12 noon.    . ferrous sulfate 325 (65 FE) MG tablet Take 325 mg by mouth daily.    . Fluticasone-Salmeterol (ADVAIR) 250-50 MCG/DOSE AEPB Inhale 1 puff into the lungs as needed.     . furosemide (LASIX) 40 MG tablet Take 40 mg by mouth daily.    Marland Kitchen ketoconazole (NIZORAL) 2 % cream Apply 1 application topically daily as needed.     Marland Kitchen lisinopril (ZESTRIL) 2.5 MG tablet Take 2.5 mg by mouth daily.     . metoprolol succinate (TOPROL-XL) 50 MG 24 hr tablet Take 150 mg by mouth daily.     .  nitroGLYCERIN (NITROSTAT) 0.4 MG SL tablet Place 0.4 mg under the tongue every 5 (five) minutes as needed.     . potassium chloride (K-DUR) 10 MEQ tablet Take 10 mEq by mouth daily.     . sertraline (ZOLOFT) 25 MG tablet Take 25 mg by mouth daily.     . simethicone (MYLICON) 395 MG chewable tablet Chew 125 mg by mouth every 6 (six) hours as needed.     . Skin Protectants, Misc. (REMEDY PHYTOPLEX HYDRAGUARD) CREA Apply topically.    Marland Kitchen umeclidinium-vilanterol (ANORO ELLIPTA) 62.5-25 MCG/INH AEPB Inhale 1 puff into the lungs daily as needed. Takes daily     No current facility-administered medications for this visit.    Past Medical History:  Diagnosis Date  . Anemia   . BPH (benign prostatic hyperplasia)   . CHF (congestive heart failure) ([place])   . Diabetes mellitus without complication ([place])   . Dysphagia   . GERD (gastroesophageal reflux disease)   . HLD (hyperlipidemia)   . HTN (hypertension)   . Irregular heart beat   . Nocturia   . Urinary retention     Past Surgical History:  Procedure Laterality Date  . AMPUTATION  TOE Left 05/04/2017   Procedure: AMPUTATION TOE - third left toe;  Surgeon: Samara Deist, DPM;  Location: [facility] ORS;  Service: Podiatry;  Laterality: Left;  . bladder patching  1964  . HIP SURGERY Right 2014  . IRRIGATION AND DEBRIDEMENT FOOT Left 11/14/2016   Procedure: IRRIGATION AND DEBRIDEMENT FOOT;  Surgeon: Samara Deist, DPM;  Location: [facility] OR[facility];  Service: Podiatry;  Laterality: Left;  . TRANSURETHRAL RESECTION OF PROSTATE       Social History   Tobacco Use  . Smoking status: Never Smoker  . Smokeless tobacco: Never Used  Vaping Use  . Vaping Use: Never used  Substance Use Topics  . Alcohol use: No  . Drug use: No      Family History  Problem Relation Age of Onset  . Ulcers Mother        stomach  . GER disease Sister        GERD  . Heart attack Father   . Diabetes Brother   . Depression Brother   . Kidney disease Neg Hx   . Prostate  cancer Neg Hx   . Kidney cancer Neg Hx   . Bladder Cancer Neg Hx     Allergies  Allergen Reactions  . Hydrocodone-Acetaminophen Nausea And Vomiting    REVIEW OF SYSTEMS(Negative unless checked)  Constitutional: [] ??Weight loss[] ??Fever[] ??Chills Cardiac:[] ??Chest pain[] ??Chest pressure[] ??Palpitations [] ??Shortness of breath when laying flat [x] ??Shortness of breath at rest [x] ??Shortness of breath with exertion. Vascular: [x] ??Pain in legs with walking[x] ??Pain in legsat rest [] ??Pain in legs when laying flat [] ??Claudication [] ??Pain in feet when walking [] ??Pain in feet at rest [] ??Pain in feet when laying flat [] ??History of DVT [] ??Phlebitis [x] ??Swelling in legs [] ??Varicose veins [x] ??Non-healing ulcers Pulmonary: [] ??Uses home oxygen [] ??Productive cough[] ??Hemoptysis [] ??[place]eze [] ??COPD [] ??Asthma Neurologic: [] ??Dizziness [] ??Blackouts [] ??Seizures [] ??History of stroke [] ??History of TIA[] ??Aphasia [] ??Temporary blindness[] ??Dysphagia [] ??Weaknessor numbness in arms [] ??Weakness or numbnessin legs Musculoskeletal: [x] ??Arthritis [] ??Joint swelling [x] ??Joint pain [] ??Low back pain Hematologic:[] ??Easy bruising[] ??Easy bleeding [] ??Hypercoagulable state [] ??Anemic [] ??Hepatitis Gastrointestinal:[] ??Blood in stool[] ??Vomiting blood[x] ??Gastroesophageal reflux/heartburn[] ??Abdominal pain Genitourinary: [] ??Chronic kidney disease [] ??Difficulturination [] ??Frequenturination [] ??Burning with urination[] ??Hematuria Skin: [] ??Rashes [x] ??Ulcers [x] ??Wounds Psychological: [] ??History of anxiety[] ??History of major depression.  Physical Examination  BP 104/75 (BP Location: Right Arm)   Pulse (!) 111   Resp 20   Ht 6' 1"  (1.854 m)   Wt 228 lb (103.4 kg)   BMI 30.08 kg/m  Gen:  WD/WN, NAD Head: [place]/AT, No temporalis wasting. Ear/Nose/Throat: Hearing grossly  intact, nares w/o erythema or drainage Eyes: Conjunctiva clear. Sclera non-icteric Neck: Supple.  Trachea midline Pulmonary:  Good air movement, no use of accessory muscles.  Cardiac: Irregular and tachycardic Vascular:  Vessel Right Left  Radial Palpable Palpable       Musculoskeletal: M/S 5/5 throughout.  No deformity or atrophy.  Previous ulcers have healed.  1+ bilateral lower extremity edema. Neurologic: Sensation grossly intact in extremities.  Symmetrical.  Speech is fluent.  Psychiatric: Judgment intact, Mood & affect appropriate for pt's clinical situation. Dermatologic: No rashes or ulcers noted.  No cellulitis or open wounds.       Labs Recent Results (from the past 2160 hour(s))  Folate     Status: None   Collection Time: 02/18/20  1:21 PM  Result Value Ref Range   Folate 6.6 >5.9 ng/mL    Comment: Performed at [facility], [address] [place], [place] [address]  Ferritin     Status: None   Collection Time: 02/18/20  1:21 PM  Result Value Ref Range   Ferritin 151 24 -  336 ng/mL    Comment: Performed at [facility], [address] [place], [place] [address]  CBC with Differential/Platelet     Status: None   Collection Time: 02/18/20  1:21 PM  Result Value Ref Range   WBC 6.5 4.0 - 10.5 K/uL   RBC 4.36 4.22 - 5.81 MIL/uL   Hemoglobin 13.5 13.0 - 17.0 g/dL   HCT 40.3 39.0 - 52.0 %   MCV 92.4 80.0 - 100.0 fL   MCH 31.0 26.0 - 34.0 pg   MCHC 33.5 30.0 - 36.0 g/dL   RDW 13.7 11.5 - 15.5 %   Platelets 160 150 - 400 K/uL   nRBC 0.0 0.0 - 0.2 %   Neutrophils Relative % 59 %   Neutro Abs 3.9 1.7 - 7.7 K/uL   Lymphocytes Relative 27 %   Lymphs Abs 1.8 0.7 - 4.0 K/uL   Monocytes Relative 8 %   Monocytes Absolute 0.5 0.1 - 1.0 K/uL   Eosinophils Relative 4 %   Eosinophils Absolute 0.2 0.0 - 0.5 K/uL   Basophils Relative 1 %   Basophils Absolute 0.1 0.0 - 0.1 K/uL   Immature Granulocytes 1 %   Abs Immature Granulocytes 0.04 0.00 - 0.07  K/uL    Comment: Performed at [facility], [address] [place], [place] [address]    Radiology No results found.  Assessment/Plan Benign essential HTN blood pressure control important in reducing the progression of atherosclerotic disease. On appropriate oral medications.   Diabetes mellitus, type 2 blood glucose control important in reducing the progression of atherosclerotic disease. Also, involved in wound healing. On appropriate medications.   Acute on chronic combined systolic and diastolic heart failure (HCC) Chronic heart and pulmonary issues would certainly worsen lower extremity swelling  Lower limb ulcer, calf, left, with fat layer exposed ([place]) These ulcers have now healed.  Were going to come out of Unna boots today.  Swelling of limb Much better with Unna boots.  Return to come out of Unna boots today and just go to his compression socks or wraps.  Recheck in 1 month.    Leotis Pain, MD  02/24/2020 3:38 PM    This note was created with Dragon medical transcription system.  Any errors from dictation are purely unintentional

## 2020-02-24 NOTE — Assessment & Plan Note (Signed)
These ulcers have now healed.  Were going to come out of Unna boots today.

## 2020-03-02 ENCOUNTER — Encounter (INDEPENDENT_AMBULATORY_CARE_PROVIDER_SITE_OTHER): Payer: Medicare PPO

## 2020-03-17 ENCOUNTER — Telehealth: Payer: Self-pay | Admitting: Hematology and Oncology

## 2020-03-17 ENCOUNTER — Inpatient Hospital Stay: Payer: Medicare PPO

## 2020-03-17 NOTE — Telephone Encounter (Signed)
Spoke to son about his father missing an appointment today. He said that his father stays with his sister and she will call back to reschedule appointment.

## 2020-03-18 ENCOUNTER — Other Ambulatory Visit: Payer: Self-pay

## 2020-03-18 ENCOUNTER — Inpatient Hospital Stay: Payer: Medicare PPO | Attending: Hematology and Oncology

## 2020-03-18 DIAGNOSIS — E538 Deficiency of other specified B group vitamins: Secondary | ICD-10-CM | POA: Diagnosis present

## 2020-03-18 MED ORDER — CYANOCOBALAMIN 1000 MCG/ML IJ SOLN
1000.0000 ug | Freq: Once | INTRAMUSCULAR | Status: AC
  Administered 2020-03-18: 1000 ug via INTRAMUSCULAR
  Filled 2020-03-18: qty 1

## 2020-03-23 ENCOUNTER — Ambulatory Visit (INDEPENDENT_AMBULATORY_CARE_PROVIDER_SITE_OTHER): Payer: Medicare PPO | Admitting: Vascular Surgery

## 2020-04-13 ENCOUNTER — Ambulatory Visit (INDEPENDENT_AMBULATORY_CARE_PROVIDER_SITE_OTHER): Payer: Medicare PPO | Admitting: Vascular Surgery

## 2020-04-14 ENCOUNTER — Inpatient Hospital Stay: Payer: Medicare PPO | Attending: Hematology and Oncology

## 2020-04-20 ENCOUNTER — Ambulatory Visit (INDEPENDENT_AMBULATORY_CARE_PROVIDER_SITE_OTHER): Payer: Medicare PPO | Admitting: Vascular Surgery

## 2020-04-20 ENCOUNTER — Other Ambulatory Visit: Payer: Self-pay

## 2020-04-20 ENCOUNTER — Encounter (INDEPENDENT_AMBULATORY_CARE_PROVIDER_SITE_OTHER): Payer: Self-pay | Admitting: Vascular Surgery

## 2020-04-20 VITALS — BP 124/81 | HR 103 | Ht 73.0 in | Wt 226.0 lb

## 2020-04-20 DIAGNOSIS — I10 Essential (primary) hypertension: Secondary | ICD-10-CM

## 2020-04-20 DIAGNOSIS — I5023 Acute on chronic systolic (congestive) heart failure: Secondary | ICD-10-CM

## 2020-04-20 DIAGNOSIS — L97221 Non-pressure chronic ulcer of left calf limited to breakdown of skin: Secondary | ICD-10-CM

## 2020-04-20 DIAGNOSIS — E1140 Type 2 diabetes mellitus with diabetic neuropathy, unspecified: Secondary | ICD-10-CM

## 2020-04-20 DIAGNOSIS — M7989 Other specified soft tissue disorders: Secondary | ICD-10-CM

## 2020-04-20 NOTE — Progress Notes (Signed)
MRN : [id number]  Jeremy Herrera is a 80 y.o. (06-Nov-1940) male who presents with chief complaint of  Chief Complaint  Patient presents with  . Follow-up    4 wks no studies   .   History of Present Illness: Patient returns today in follow up of his leg swelling and previous ulceration.  His previous ulcers healed, but he had a trauma and has multiple scabs and superficial ulcers on his left leg currently.  The swelling is mild to moderate, and he says the wounds are slowly improving.  This happened a couple of weeks ago.  No fevers or chills.  No significant erythema or drainage.  Current Outpatient Medications  Medication Sig Dispense Refill  . Acetaminophen 500 MG capsule SMARTSIG:By Mouth    . albuterol (VENTOLIN HFA) 108 (90 Base) MCG/ACT inhaler Inhale 1-2 puffs into the lungs every 4 (four) hours as needed.     Marland Kitchen amoxicillin-clavulanate (AUGMENTIN) 875-125 MG tablet Take 1 tablet by mouth 2 (two) times daily. 20 tablet 0  . aspirin 81 MG tablet Take 81 mg by mouth daily.    . bimatoprost (LUMIGAN) 0.01 % SOLN 1 drop nightly    . clopidogrel (PLAVIX) 75 MG tablet Take by mouth.    . Cyanocobalamin (B-12) 1000 MCG/ML KIT Inject 1 mL as directed every 30 (thirty) days.    Marland Kitchen donepezil (ARICEPT) 5 MG tablet Take 5 mg by mouth at bedtime.     . dorzolamide-timolol (COSOPT) 22.3-6.8 MG/ML ophthalmic solution Place 1 drop into both eyes 2 (two) times daily.     Marland Kitchen EMOLLIENT EX Apply topically daily.     Marland Kitchen esomeprazole (NEXIUM) 20 MG capsule Take 20 mg by mouth daily at 12 noon.    . ferrous sulfate 325 (65 FE) MG tablet Take 325 mg by mouth daily.    . Fluticasone-Salmeterol (ADVAIR) 250-50 MCG/DOSE AEPB Inhale 1 puff into the lungs as needed.     . furosemide (LASIX) 40 MG tablet Take 40 mg by mouth daily.    Marland Kitchen ketoconazole (NIZORAL) 2 % cream Apply 1 application topically daily as needed.     Marland Kitchen lisinopril (ZESTRIL) 2.5 MG tablet Take 2.5 mg by mouth daily.     . metoprolol  succinate (TOPROL-XL) 50 MG 24 hr tablet Take 150 mg by mouth daily.     . nitroGLYCERIN (NITROSTAT) 0.4 MG SL tablet Place 0.4 mg under the tongue every 5 (five) minutes as needed.     . potassium chloride (K-DUR) 10 MEQ tablet Take 10 mEq by mouth daily.     . sertraline (ZOLOFT) 25 MG tablet Take 25 mg by mouth daily.     . simethicone (MYLICON) 771 MG chewable tablet Chew 125 mg by mouth every 6 (six) hours as needed.     . Skin Protectants, Misc. (REMEDY PHYTOPLEX HYDRAGUARD) CREA Apply topically.    Marland Kitchen umeclidinium-vilanterol (ANORO ELLIPTA) 62.5-25 MCG/INH AEPB Inhale 1 puff into the lungs daily as needed. Takes daily     No current facility-administered medications for this visit.    Past Medical History:  Diagnosis Date  . Anemia   . BPH (benign prostatic hyperplasia)   . CHF (congestive heart failure) ([place])   . Diabetes mellitus without complication ([place])   . Dysphagia   . GERD (gastroesophageal reflux disease)   . HLD (hyperlipidemia)   . HTN (hypertension)   . Irregular heart beat   . Nocturia   . Urinary retention  Past Surgical History:  Procedure Laterality Date  . AMPUTATION TOE Left 05/04/2017   Procedure: AMPUTATION TOE - third left toe;  Surgeon: Samara Deist, DPM;  Location: [facility] ORS;  Service: Podiatry;  Laterality: Left;  . bladder patching  1964  . HIP SURGERY Right 2014  . IRRIGATION AND DEBRIDEMENT FOOT Left 11/14/2016   Procedure: IRRIGATION AND DEBRIDEMENT FOOT;  Surgeon: Samara Deist, DPM;  Location: [facility] ORS;  Service: Podiatry;  Laterality: Left;  . TRANSURETHRAL RESECTION OF PROSTATE       Social History   Tobacco Use  . Smoking status: Never Smoker  . Smokeless tobacco: Never Used  Vaping Use  . Vaping Use: Never used  Substance Use Topics  . Alcohol use: No  . Drug use: No       Family History  Problem Relation Age of Onset  . Ulcers Mother        stomach  . GER disease Sister        GERD  . Heart attack Father   .  Diabetes Brother   . Depression Brother   . Kidney disease Neg Hx   . Prostate cancer Neg Hx   . Kidney cancer Neg Hx   . Bladder Cancer Neg Hx     Allergies  Allergen Reactions  . Hydrocodone-Acetaminophen Nausea And Vomiting      REVIEW OF SYSTEMS(Negative unless checked)  Constitutional: [] ???Weight loss[] ???Fever[] ???Chills Cardiac:[] ???Chest pain[] ???Chest pressure[] ???Palpitations [] ???Shortness of breath when laying flat [x] ???Shortness of breath at rest [x] ???Shortness of breath with exertion. Vascular: [x] ???Pain in legs with walking[x] ???Pain in legsat rest [] ???Pain in legs when laying flat [] ???Claudication [] ???Pain in feet when walking [] ???Pain in feet at rest [] ???Pain in feet when laying flat [] ???History of DVT [] ???Phlebitis [x] ???Swelling in legs [] ???Varicose veins [x] ???Non-healing ulcers Pulmonary: [] ???Uses home oxygen [] ???Productive cough[] ???Hemoptysis [] ???Wheeze [] ???COPD [] ???Asthma Neurologic: [] ???Dizziness [] ???Blackouts [] ???Seizures [] ???History of stroke [] ???History of TIA[] ???Aphasia [] ???Temporary blindness[] ???Dysphagia [] ???Weaknessor numbness in arms [] ???Weakness or numbnessin legs Musculoskeletal: [x] ???Arthritis [] ???Joint swelling [x] ???Joint pain [] ???Low back pain Hematologic:[] ???Easy bruising[] ???Easy bleeding [] ???Hypercoagulable state [] ???Anemic [] ???Hepatitis Gastrointestinal:[] ???Blood in stool[] ???Vomiting blood[x] ???Gastroesophageal reflux/heartburn[] ???Abdominal pain Genitourinary: [] ???Chronic kidney disease [] ???Difficulturination [] ???Frequenturination [] ???Burning with urination[] ???Hematuria Skin: [] ???Rashes [x] ???Ulcers [x] ???Wounds Psychological: [] ???History of anxiety[] ???History of major depression.  Physical Examination  BP 124/81   Pulse (!) 103   Ht 6' 1"  (1.854 m)   Wt 226 lb (102.5 kg)    BMI 29.82 kg/m  Gen:  WD/WN, NAD Head: [place]/AT, No temporalis wasting. Ear/Nose/Throat: Hearing grossly intact, nares w/o erythema or drainage Eyes: Conjunctiva clear. Sclera non-icteric Neck: Supple.  Trachea midline Pulmonary:  Good air movement, no use of accessory muscles.  Cardiac: tachycardic, irregular Vascular:  Vessel Right Left  Radial Palpable Palpable        Musculoskeletal: M/S 5/5 throughout.  No deformity or atrophy.  Diffuse scabs and superficial ulcerations throughout the left calf and lower leg.  1+ right lower extremity edema, 2+ left lower extremity edema. Neurologic: Sensation grossly intact in extremities.  Symmetrical.  Speech is fluent.  Psychiatric: Judgment intact, Mood & affect appropriate for pt's clinical situation. Dermatologic: No rashes or ulcers noted.  No cellulitis or open wounds.       Labs Recent Results (from the past 2160 hour(s))  Folate     Status: None   Collection Time: 02/18/20  1:21 PM  Result Value Ref Range   Folate 6.6 >5.9 ng/mL    Comment: Performed at [facility] [address], [place], [place] [address]  Ferritin     Status: None  Collection Time: 02/18/20  1:21 PM  Result Value Ref Range   Ferritin 151 24 - 336 ng/mL    Comment: Performed at [facility], [address] [place], [place] [address]  CBC with Differential/Platelet     Status: None   Collection Time: 02/18/20  1:21 PM  Result Value Ref Range   WBC 6.5 4.0 - 10.5 K/uL   RBC 4.36 4.22 - 5.81 MIL/uL   Hemoglobin 13.5 13.0 - 17.0 g/dL   HCT 40.3 39.0 - 52.0 %   MCV 92.4 80.0 - 100.0 fL   MCH 31.0 26.0 - 34.0 pg   MCHC 33.5 30.0 - 36.0 g/dL   RDW 13.7 11.5 - 15.5 %   Platelets 160 150 - 400 K/uL   nRBC 0.0 0.0 - 0.2 %   Neutrophils Relative % 59 %   Neutro Abs 3.9 1.7 - 7.7 K/uL   Lymphocytes Relative 27 %   Lymphs Abs 1.8 0.7 - 4.0 K/uL   Monocytes Relative 8 %   Monocytes Absolute 0.5 0.1 - 1.0 K/uL   Eosinophils Relative 4 %    Eosinophils Absolute 0.2 0.0 - 0.5 K/uL   Basophils Relative 1 %   Basophils Absolute 0.1 0.0 - 0.1 K/uL   Immature Granulocytes 1 %   Abs Immature Granulocytes 0.04 0.00 - 0.07 K/uL    Comment: Performed at [facility], [address] [place], [place] [address]    Radiology No results found.  Assessment/Plan Benign essential HTN blood pressure control important in reducing the progression of atherosclerotic disease. On appropriate oral medications.   Diabetes mellitus, type 2 blood glucose control important in reducing the progression of atherosclerotic disease. Also, involved in wound healing. On appropriate medications.   Acute on chronic combined systolic and diastolic heart failure (HCC) Chronic heart and pulmonary issues would certainly worsen lower extremity swelling  Swelling of limb Reasonably well-controlled with compression and elevation.  However, with recurrent ulcerations on the left leg were to go back into the Unna boots on the left leg.  Lower limb ulcer, calf, left, limited to breakdown of skin ([place]) The patient is developed recurrent ulcerations on the left lower leg secondary to trauma.  They are healing quite slowly and with swelling on that leg and ulceration, Unna boot placement today would be prudent.  This will be changed weekly for 2 to 3 weeks and we will reassess the leg to see if we can come out of Unna boots again.  3 layer Unna boot was placed today.    Leotis Pain, MD  04/21/2020 10:04 AM    This note was created with Dragon medical transcription system.  Any errors from dictation are purely unintentional

## 2020-04-21 NOTE — Assessment & Plan Note (Signed)
Reasonably well-controlled with compression and elevation.  However, with recurrent ulcerations on the left leg were to go back into the Unna boots on the left leg.

## 2020-04-21 NOTE — Assessment & Plan Note (Signed)
The patient is developed recurrent ulcerations on the left lower leg secondary to trauma.  They are healing quite slowly and with swelling on that leg and ulceration, Unna boot placement today would be prudent.  This will be changed weekly for 2 to 3 weeks and we will reassess the leg to see if we can come out of Unna boots again.  3 layer Unna boot was placed today.

## 2020-05-11 ENCOUNTER — Ambulatory Visit (INDEPENDENT_AMBULATORY_CARE_PROVIDER_SITE_OTHER): Payer: Medicare PPO | Admitting: Vascular Surgery

## 2020-05-19 ENCOUNTER — Other Ambulatory Visit: Payer: Self-pay

## 2020-05-19 ENCOUNTER — Inpatient Hospital Stay: Payer: Medicare PPO | Attending: Hematology and Oncology

## 2020-05-19 DIAGNOSIS — E538 Deficiency of other specified B group vitamins: Secondary | ICD-10-CM | POA: Diagnosis not present

## 2020-05-19 MED ORDER — CYANOCOBALAMIN 1000 MCG/ML IJ SOLN
1000.0000 ug | Freq: Once | INTRAMUSCULAR | Status: AC
  Administered 2020-05-19: 1000 ug via INTRAMUSCULAR
  Filled 2020-05-19: qty 1

## 2020-06-16 ENCOUNTER — Inpatient Hospital Stay: Payer: Medicare PPO

## 2020-07-14 ENCOUNTER — Ambulatory Visit: Payer: Medicare PPO

## 2020-07-14 ENCOUNTER — Other Ambulatory Visit: Payer: Self-pay

## 2020-07-14 ENCOUNTER — Inpatient Hospital Stay: Payer: Medicare PPO | Attending: Oncology

## 2020-07-14 DIAGNOSIS — E538 Deficiency of other specified B group vitamins: Secondary | ICD-10-CM | POA: Insufficient documentation

## 2020-07-14 MED ORDER — CYANOCOBALAMIN 1000 MCG/ML IJ SOLN
1000.0000 ug | Freq: Once | INTRAMUSCULAR | Status: AC
Start: 2020-07-14 — End: 2020-07-14
  Administered 2020-07-14: 1000 ug via INTRAMUSCULAR

## 2020-08-15 ENCOUNTER — Other Ambulatory Visit: Payer: Self-pay

## 2020-08-15 DIAGNOSIS — D696 Thrombocytopenia, unspecified: Secondary | ICD-10-CM

## 2020-08-15 DIAGNOSIS — D509 Iron deficiency anemia, unspecified: Secondary | ICD-10-CM

## 2020-08-15 DIAGNOSIS — E538 Deficiency of other specified B group vitamins: Secondary | ICD-10-CM

## 2020-08-19 ENCOUNTER — Inpatient Hospital Stay: Payer: Medicare PPO | Attending: Oncology

## 2020-08-19 ENCOUNTER — Inpatient Hospital Stay: Payer: Medicare PPO

## 2020-08-19 ENCOUNTER — Encounter: Payer: Self-pay | Admitting: Oncology

## 2020-08-19 ENCOUNTER — Inpatient Hospital Stay: Payer: Medicare PPO | Admitting: Oncology

## 2020-08-19 ENCOUNTER — Other Ambulatory Visit: Payer: Self-pay

## 2020-08-19 VITALS — BP 109/79 | HR 88 | Temp 97.9°F | Resp 18 | Wt 225.5 lb

## 2020-08-19 DIAGNOSIS — D693 Immune thrombocytopenic purpura: Secondary | ICD-10-CM | POA: Diagnosis not present

## 2020-08-19 DIAGNOSIS — D696 Thrombocytopenia, unspecified: Secondary | ICD-10-CM

## 2020-08-19 DIAGNOSIS — D509 Iron deficiency anemia, unspecified: Secondary | ICD-10-CM

## 2020-08-19 DIAGNOSIS — E538 Deficiency of other specified B group vitamins: Secondary | ICD-10-CM

## 2020-08-19 LAB — CBC WITH DIFFERENTIAL/PLATELET
Abs Immature Granulocytes: 0.02 10*3/uL (ref 0.00–0.07)
Basophils Absolute: 0.1 10*3/uL (ref 0.0–0.1)
Basophils Relative: 1 %
Eosinophils Absolute: 0.2 10*3/uL (ref 0.0–0.5)
Eosinophils Relative: 3 %
HCT: 38.7 % — ABNORMAL LOW (ref 39.0–52.0)
Hemoglobin: 13.2 g/dL (ref 13.0–17.0)
Immature Granulocytes: 0 %
Lymphocytes Relative: 32 %
Lymphs Abs: 2.1 10*3/uL (ref 0.7–4.0)
MCH: 31.8 pg (ref 26.0–34.0)
MCHC: 34.1 g/dL (ref 30.0–36.0)
MCV: 93.3 fL (ref 80.0–100.0)
Monocytes Absolute: 0.5 10*3/uL (ref 0.1–1.0)
Monocytes Relative: 8 %
Neutro Abs: 3.7 10*3/uL (ref 1.7–7.7)
Neutrophils Relative %: 56 %
Platelets: 151 10*3/uL (ref 150–400)
RBC: 4.15 MIL/uL — ABNORMAL LOW (ref 4.22–5.81)
RDW: 14.7 % (ref 11.5–15.5)
WBC: 6.6 10*3/uL (ref 4.0–10.5)
nRBC: 0 % (ref 0.0–0.2)

## 2020-08-19 LAB — FERRITIN: Ferritin: 167 ng/mL (ref 24–336)

## 2020-08-19 MED ORDER — VITAMIN B-12 1000 MCG PO TABS: 1000.0000 ug | ORAL_TABLET | Freq: Every day | ORAL | 1 refills | Status: DC

## 2020-08-19 MED ORDER — CYANOCOBALAMIN 1000 MCG/ML IJ SOLN
1000.0000 ug | Freq: Once | INTRAMUSCULAR | Status: AC
  Administered 2020-08-19: 1000 ug via INTRAMUSCULAR

## 2020-08-19 NOTE — Progress Notes (Signed)
[facility] [place] [facility]  [address], [address] [place], [place] [address] Phone: [phone]  Fax: [phone]   Clinic Day:  08/19/2020  Referring physician: Feldpausch, Dale E, MD  Chief Complaint: Jeremy Herrera is a 80 y.o. male with B12 deficiency, iron deficiency, and mild thrombocytopenia (ITP) who is seen for a 6 month assessment.    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  He denies any new medications or herbal products. He has no known autoimmune disease. He has never required transfusion. 09/16/2014 work-up revealed a hematocrit 36.4, hemoglobin 12.3, MCV 91.5, platelets 124,000 white count 4800 with an ANC of 3000. Differential was unremarkable. B12 was 262 (low normal) with an elevated MMA consistent with B12 deficiency.  He began weekly B12 on 10/14/2014 (last 07/23/2019) followed by monthly vitamin B12 injections..  Folate was 18.1 on 08/30/2016 and 19.9 on 03/04/2019.  Normal labs included: folate, CMP, ANA, iron studies, hepatitis C antibody, hepatitis B surface antigen and core antibody, HIV testing, SPEP, free light chains, and PTT.  TSH  was normal on 08/25/2014.  UPEP revealed no monoclonal protein.   #Iron deficiency anemia EGD and colonoscopy 10 years ago were negative. He required esophageal  dilatation. He has a history of reflux. Colonoscopy 3-4 years ago was unremarkable per the patient.  His diet is good.  He eats meat twice a week.  He is on oral iron.    #Interstitial lung disease #Chronically elevated alkaline phosphatase.  Since 2013. #06/28/2018 [facility] left atrial appendage closure device surgery on 09/25/2018. 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  #05/16/2019 - 05/19/2019, admitted to [facility] due to CHF exacerbation.  INTERVAL HISTORY Jeremy Herrera is a 80 y.o. male who has above history reviewed by me today presents for follow up visit for chronic ITP, iron deficiency anemia,  Patient is has been on vitamin B12 monthly injections.  Today he reports feeling well.   Past Medical History:  Diagnosis Date   Anemia    BPH (benign prostatic hyperplasia)    CHF (congestive heart failure) (HCC)    Diabetes mellitus without complication (HCC)    Dysphagia    GERD (gastroesophageal reflux disease)    HLD (hyperlipidemia)    HTN (hypertension)    Irregular heart beat    Nocturia    Urinary retention     Past Surgical History:  Procedure Laterality Date   AMPUTATION TOE Left 05/04/2017   Procedure: AMPUTATION TOE - third left toe;  Surgeon: Fowler, Justin, DPM;  Location: [facility] ORS;  Service: Podiatry;  Laterality: Left;   bladder patching  1964   HIP SURGERY Right 2014   IRRIGATION AND DEBRIDEMENT FOOT Left 11/14/2016   Procedure: IRRIGATION AND DEBRIDEMENT FOOT;  Surgeon: Fowler, Justin, DPM;  Location: [facility] ORS;  Service: Podiatry;  Laterality: Left;   TRANSURETHRAL RESECTION OF PROSTATE      Family History  Problem Relation Age of Onset   Ulcers Mother        stomach   GER disease Sister        GERD   Heart attack Father    Diabetes Brother    Depression Brother    Kidney disease Neg Hx    Prostate cancer Neg Hx    Kidney cancer Neg Hx      Bladder Cancer Neg Hx     Social History:  reports that he has never smoked. He has never used smokeless tobacco. He reports that he does not drink alcohol and does not use drugs. He lives in [place] by himself. He has been staying with his daughter the past month following two hospitalizations. He lives in [place].  The patient is alone today.  Allergies:  Allergies  Allergen Reactions   Hydrocodone-Acetaminophen Nausea And Vomiting    Current Medications: Current  Outpatient Medications  Medication Sig Dispense Refill   Acetaminophen 500 MG capsule SMARTSIG:By Mouth     albuterol (VENTOLIN HFA) 108 (90 Base) MCG/ACT inhaler Inhale 1-2 puffs into the lungs every 4 (four) hours as needed.      aspirin 81 MG tablet Take 81 mg by mouth daily.     bimatoprost (LUMIGAN) 0.01 % SOLN 1 drop nightly     Cyanocobalamin (B-12) 1000 MCG/ML KIT Inject 1 mL as directed every 30 (thirty) days.     donepezil (ARICEPT) 5 MG tablet Take 5 mg by mouth at bedtime.      dorzolamide-timolol (COSOPT) 22.3-6.8 MG/ML ophthalmic solution Place 1 drop into both eyes 2 (two) times daily.      EMOLLIENT EX Apply topically daily.      esomeprazole (NEXIUM) 20 MG capsule Take 20 mg by mouth daily at 12 noon.     ferrous sulfate 325 (65 FE) MG tablet Take 325 mg by mouth daily.     Fluticasone-Salmeterol (ADVAIR) 250-50 MCG/DOSE AEPB Inhale 1 puff into the lungs as needed.      furosemide (LASIX) 40 MG tablet Take 40 mg by mouth daily.     ketoconazole (NIZORAL) 2 % cream Apply 1 application topically daily as needed.      lisinopril (ZESTRIL) 2.5 MG tablet Take 2.5 mg by mouth daily.      metoprolol succinate (TOPROL-XL) 50 MG 24 hr tablet Take 150 mg by mouth daily.      nitroGLYCERIN (NITROSTAT) 0.4 MG SL tablet Place 0.4 mg under the tongue every 5 (five) minutes as needed.      potassium chloride (K-DUR) 10 MEQ tablet Take 10 mEq by mouth daily.      rosuvastatin (CRESTOR) 10 MG tablet Take 1 tablet by mouth daily.     sertraline (ZOLOFT) 25 MG tablet Take 25 mg by mouth daily.      simethicone (MYLICON) 125 MG chewable tablet Chew 125 mg by mouth every 6 (six) hours as needed.      Skin Protectants, Misc. (REMEDY PHYTOPLEX HYDRAGUARD) CREA Apply topically.     umeclidinium-vilanterol (ANORO ELLIPTA) 62.5-25 MCG/INH AEPB Inhale 1 puff into the lungs daily as needed. Takes daily     vitamin B-12 (CYANOCOBALAMIN) 1000 MCG tablet Take 1 tablet (1,000 mcg total) by mouth daily. 90  tablet 1   clopidogrel (PLAVIX) 75 MG tablet Take by mouth. (Patient not taking: Reported on 08/19/2020)     No current facility-administered medications for this visit.    Review of Systems  Constitutional: Negative.  Negative for chills, diaphoresis, fever, malaise/fatigue and weight loss (stable).  HENT: Negative.  Negative for congestion, ear pain, hearing loss, nosebleeds, sinus pain and sore throat.   Eyes: Negative.  Negative for blurred vision and double vision.  Respiratory:  Negative for cough, hemoptysis, sputum production and shortness of breath.   Cardiovascular: Negative.  Negative for chest pain, palpitations, orthopnea, leg swelling and PND.  Gastrointestinal: Negative.  Negative for abdominal pain, blood   in stool, constipation, diarrhea, melena, nausea and vomiting.  Genitourinary: Negative.  Negative for dysuria, frequency, hematuria and urgency.  Musculoskeletal:  Negative for back pain, falls, joint pain and myalgias.       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  Skin: Negative.  Negative for itching and rash.  Neurological:  Positive for sensory change (neuropathy in feet; stable). Negative for dizziness, tremors, focal weakness, weakness and headaches.       Balance issues, uses a walker in public and a cane in his house  Endo/Heme/Allergies:  Does not bruise/bleed easily.  Psychiatric/Behavioral: Negative.  Negative for depression and memory loss. The patient is not nervous/anxious and does not have insomnia.   All other systems reviewed and are negative. Performance status (ECOG):  1-2  Vitals Blood pressure 109/79, pulse 88, temperature 97.9 F (36.6 C), resp. rate 18, weight 225 lb 8.5 oz (102.3 kg).   Physical Exam Vitals and nursing note reviewed.  Constitutional:      General: He is not in acute distress.    Appearance: He is well-developed. He is not diaphoretic.     Comments: Patient walks with a rolling walker  HENT:     Head: Normocephalic and atraumatic.      Mouth/Throat:     Pharynx: No oropharyngeal exudate.  Eyes:     General: No scleral icterus.    Extraocular Movements: Extraocular movements intact.     Conjunctiva/sclera: Conjunctivae normal.     Pupils: Pupils are equal, round, and reactive to light.  Neck:     Vascular: No JVD.  Cardiovascular:     Rate and Rhythm: Normal rate and regular rhythm.     Heart sounds: Normal heart sounds. No murmur heard. Pulmonary:     Effort: Pulmonary effort is normal. No respiratory distress.     Breath sounds: Normal breath sounds. No wheezing or rales.  Chest:  Breasts:    Right: No supraclavicular adenopathy.     Left: No supraclavicular adenopathy.  Abdominal:     General: Bowel sounds are normal. There is no distension.     Palpations: Abdomen is soft.     Tenderness: There is no abdominal tenderness. There is no guarding or rebound.  Musculoskeletal:        General: No swelling or tenderness. Normal range of motion.     Cervical back: Normal range of motion and neck supple.     Right lower leg: No edema.     Left lower leg: No edema.  Lymphadenopathy:     Head:     Right side of head: No preauricular, posterior auricular or occipital adenopathy.     Left side of head: No preauricular, posterior auricular or occipital adenopathy.     Cervical: No cervical adenopathy.     Upper Body:     Right upper body: No supraclavicular adenopathy.     Left upper body: No supraclavicular adenopathy.     Lower Body: No right inguinal adenopathy. No left inguinal adenopathy.  Skin:    General: Skin is warm and dry.     Findings: No bruising or erythema.     Nails: There is no clubbing.  Neurological:     Mental Status: He is alert and oriented to person, place, and time.  Psychiatric:        Mood and Affect: Mood normal.        Judgment: Judgment normal.    Appointment on 08/19/2020  Component Date Value Ref Range Status  Ferritin 08/19/2020 167  24 - 336 ng/mL Final   Performed at  [facility], [place], [place] [address]   WBC 08/19/2020 6.6  4.0 - 10.5 K/uL Final   RBC 08/19/2020 4.15 (A) 4.22 - 5.81 MIL/uL Final   Hemoglobin 08/19/2020 13.2  13.0 - 17.0 g/dL Final   HCT 08/19/2020 38.7 (A) 39.0 - 52.0 % Final   MCV 08/19/2020 93.3  80.0 - 100.0 fL Final   MCH 08/19/2020 31.8  26.0 - 34.0 pg Final   MCHC 08/19/2020 34.1  30.0 - 36.0 g/dL Final   RDW 08/19/2020 14.7  11.5 - 15.5 % Final   Platelets 08/19/2020 151  150 - 400 K/uL Final   nRBC 08/19/2020 0.0  0.0 - 0.2 % Final   Neutrophils Relative % 08/19/2020 56  % Final   Neutro Abs 08/19/2020 3.7  1.7 - 7.7 K/uL Final   Lymphocytes Relative 08/19/2020 32  % Final   Lymphs Abs 08/19/2020 2.1  0.7 - 4.0 K/uL Final   Monocytes Relative 08/19/2020 8  % Final   Monocytes Absolute 08/19/2020 0.5  0.1 - 1.0 K/uL Final   Eosinophils Relative 08/19/2020 3  % Final   Eosinophils Absolute 08/19/2020 0.2  0.0 - 0.5 K/uL Final   Basophils Relative 08/19/2020 1  % Final   Basophils Absolute 08/19/2020 0.1  0.0 - 0.1 K/uL Final   Immature Granulocytes 08/19/2020 0  % Final   Abs Immature Granulocytes 08/19/2020 0.02  0.00 - 0.07 K/uL Final   Performed at [facility], [address], [place], [place] [address]    Assessment And plan  1. Iron deficiency anemia, unspecified iron deficiency anemia type   2. B12 deficiency   3. Thrombocytopenia ([place])    # Thrombocytopenia Labs reviewed and discussed with patient.  Platelet count is stable at 151,000.  # Iron deficiency anemia, resolved Hemoglobin is normal at 13.2.  Ferritin is 167.  Continue to monitor.  # B12 deficiency Proceed with vitamin B12 injection today Recommend patient to start oral vitamin B12 1066mg Daily.  I will repeat B12 at the next visit.   #Atrial fibrillation, Continue aspirin.He underwent [facility] left atrial appendage closure device surgery on 09/25/2018. #RTC in 4 months for labs (CBC with diff,  ferritin, folate, B12) and B12.   I discussed the assessment and treatment plan with the patient.  The patient was provided an opportunity to ask questions and all were answered.  The patient agreed with the plan and demonstrated an understanding of the instructions.  The patient was advised to call back if the symptoms worsen or if the condition fails to improve as anticipated.  ZEarlie Server MD, PhD Hematology Oncology CEmpireat [facility]6/30/2022

## 2020-08-19 NOTE — Progress Notes (Signed)
Patient here for follow up and B12 inj. NO new concerns voiced

## 2020-11-22 IMAGING — DX DG CHEST 1V PORT
1 series · 1 of 1 positions shown · non-contrast
Comparison: 03/31/2018

CLINICAL DATA: Shortness of breath

EXAM:
PORTABLE CHEST 1 VIEW

[chest ap]
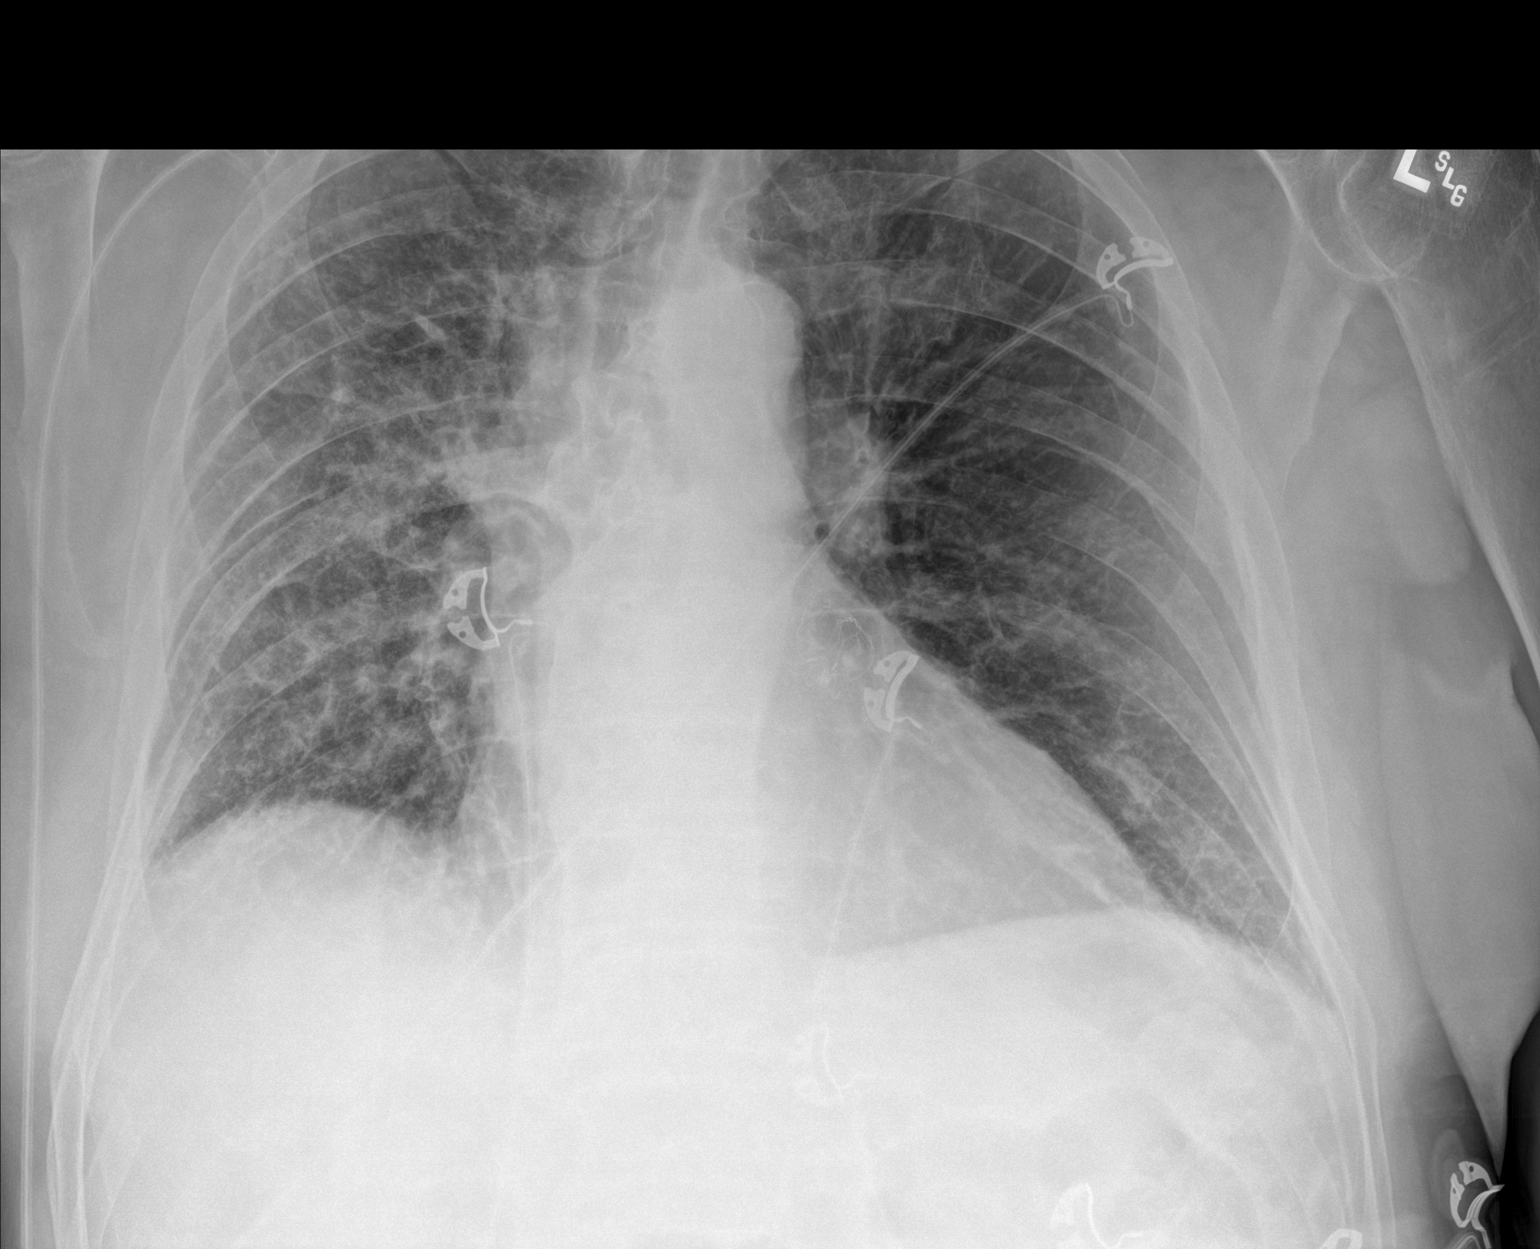

[1 of 1 positions shown; findings below may reference images not displayed]

FINDINGS: Cardiac shadow is stable in appearance. Previously seen interstitial
changes are again identified. Increased density is noted in the
right mid lung without sizable effusion. No bony abnormality is
noted.
IMPRESSION: Increased patchy opacity within the right mid lung consistent with
early infiltrate.

## 2020-12-21 ENCOUNTER — Telehealth: Payer: Self-pay | Admitting: Oncology

## 2020-12-21 NOTE — Telephone Encounter (Signed)
Appointments canceled as patient is currently admitted to [facility].

## 2020-12-21 NOTE — Telephone Encounter (Signed)
Daughter called to inform us that pt  is in the hospital and appt for 11-2 needs to be cancelled

## 2020-12-22 ENCOUNTER — Other Ambulatory Visit: Payer: Medicare PPO

## 2020-12-23 ENCOUNTER — Ambulatory Visit: Payer: Medicare PPO | Admitting: Oncology

## 2020-12-23 ENCOUNTER — Ambulatory Visit: Payer: Medicare PPO

## 2020-12-28 ENCOUNTER — Ambulatory Visit: Payer: Medicare PPO | Admitting: Oncology

## 2020-12-28 ENCOUNTER — Ambulatory Visit: Payer: Medicare PPO

## 2021-09-07 DIAGNOSIS — I739 Peripheral vascular disease, unspecified: Secondary | ICD-10-CM | POA: Diagnosis not present

## 2021-09-07 DIAGNOSIS — B351 Tinea unguium: Secondary | ICD-10-CM | POA: Diagnosis not present

## 2021-09-07 DIAGNOSIS — L851 Acquired keratosis [keratoderma] palmaris et plantaris: Secondary | ICD-10-CM | POA: Diagnosis not present

## 2021-10-04 DIAGNOSIS — Z96641 Presence of right artificial hip joint: Secondary | ICD-10-CM | POA: Diagnosis not present

## 2021-10-25 ENCOUNTER — Encounter
Admission: EM | Disposition: A | Discharge status: Skilled Nursing Facility | Payer: Self-pay | Source: Home / Self Care | Attending: Osteopathic Medicine

## 2021-10-25 ENCOUNTER — Other Ambulatory Visit: Payer: Self-pay

## 2021-10-25 ENCOUNTER — Inpatient Hospital Stay: Payer: Medicare PPO | Admitting: Anesthesiology

## 2021-10-25 ENCOUNTER — Inpatient Hospital Stay
Admission: EM | Admit: 2021-10-25 | Discharge: 2021-11-04 | DRG: 480 | Disposition: A | Discharge status: Skilled Nursing Facility | Payer: Medicare PPO | Attending: Internal Medicine | Admitting: Internal Medicine

## 2021-10-25 ENCOUNTER — Inpatient Hospital Stay: Payer: Medicare PPO

## 2021-10-25 ENCOUNTER — Emergency Department: Payer: Medicare PPO

## 2021-10-25 DIAGNOSIS — E1122 Type 2 diabetes mellitus with diabetic chronic kidney disease: Secondary | ICD-10-CM | POA: Diagnosis not present

## 2021-10-25 DIAGNOSIS — J449 Chronic obstructive pulmonary disease, unspecified: Secondary | ICD-10-CM | POA: Diagnosis not present

## 2021-10-25 DIAGNOSIS — Z515 Encounter for palliative care: Secondary | ICD-10-CM | POA: Diagnosis not present

## 2021-10-25 DIAGNOSIS — S72142A Displaced intertrochanteric fracture of left femur, initial encounter for closed fracture: Principal | ICD-10-CM

## 2021-10-25 DIAGNOSIS — S50312A Abrasion of left elbow, initial encounter: Secondary | ICD-10-CM | POA: Diagnosis present

## 2021-10-25 DIAGNOSIS — S72142K Displaced intertrochanteric fracture of left femur, subsequent encounter for closed fracture with nonunion: Secondary | ICD-10-CM | POA: Diagnosis not present

## 2021-10-25 DIAGNOSIS — Z89422 Acquired absence of other left toe(s): Secondary | ICD-10-CM

## 2021-10-25 DIAGNOSIS — K5730 Diverticulosis of large intestine without perforation or abscess without bleeding: Secondary | ICD-10-CM | POA: Diagnosis not present

## 2021-10-25 DIAGNOSIS — I110 Hypertensive heart disease with heart failure: Secondary | ICD-10-CM | POA: Diagnosis not present

## 2021-10-25 DIAGNOSIS — J849 Interstitial pulmonary disease, unspecified: Secondary | ICD-10-CM | POA: Diagnosis present

## 2021-10-25 DIAGNOSIS — R1312 Dysphagia, oropharyngeal phase: Secondary | ICD-10-CM | POA: Diagnosis present

## 2021-10-25 DIAGNOSIS — Z885 Allergy status to narcotic agent status: Secondary | ICD-10-CM | POA: Diagnosis not present

## 2021-10-25 DIAGNOSIS — E440 Moderate protein-calorie malnutrition: Secondary | ICD-10-CM | POA: Diagnosis present

## 2021-10-25 DIAGNOSIS — Z7902 Long term (current) use of antithrombotics/antiplatelets: Secondary | ICD-10-CM

## 2021-10-25 DIAGNOSIS — E119 Type 2 diabetes mellitus without complications: Secondary | ICD-10-CM | POA: Diagnosis present

## 2021-10-25 DIAGNOSIS — S72142D Displaced intertrochanteric fracture of left femur, subsequent encounter for closed fracture with routine healing: Secondary | ICD-10-CM | POA: Diagnosis not present

## 2021-10-25 DIAGNOSIS — I5032 Chronic diastolic (congestive) heart failure: Secondary | ICD-10-CM | POA: Diagnosis not present

## 2021-10-25 DIAGNOSIS — R54 Age-related physical debility: Secondary | ICD-10-CM | POA: Diagnosis present

## 2021-10-25 DIAGNOSIS — I482 Chronic atrial fibrillation, unspecified: Secondary | ICD-10-CM | POA: Diagnosis present

## 2021-10-25 DIAGNOSIS — R5381 Other malaise: Secondary | ICD-10-CM | POA: Diagnosis not present

## 2021-10-25 DIAGNOSIS — F325 Major depressive disorder, single episode, in full remission: Secondary | ICD-10-CM | POA: Diagnosis not present

## 2021-10-25 DIAGNOSIS — W010XXA Fall on same level from slipping, tripping and stumbling without subsequent striking against object, initial encounter: Secondary | ICD-10-CM | POA: Diagnosis present

## 2021-10-25 DIAGNOSIS — W19XXXD Unspecified fall, subsequent encounter: Secondary | ICD-10-CM | POA: Diagnosis not present

## 2021-10-25 DIAGNOSIS — I472 Ventricular tachycardia, unspecified: Secondary | ICD-10-CM | POA: Diagnosis not present

## 2021-10-25 DIAGNOSIS — Z6828 Body mass index (BMI) 28.0-28.9, adult: Secondary | ICD-10-CM | POA: Diagnosis not present

## 2021-10-25 DIAGNOSIS — I10 Essential (primary) hypertension: Secondary | ICD-10-CM | POA: Diagnosis not present

## 2021-10-25 DIAGNOSIS — E785 Hyperlipidemia, unspecified: Secondary | ICD-10-CM | POA: Diagnosis present

## 2021-10-25 DIAGNOSIS — I9581 Postprocedural hypotension: Secondary | ICD-10-CM | POA: Diagnosis not present

## 2021-10-25 DIAGNOSIS — J811 Chronic pulmonary edema: Secondary | ICD-10-CM | POA: Diagnosis not present

## 2021-10-25 DIAGNOSIS — E861 Hypovolemia: Secondary | ICD-10-CM | POA: Diagnosis present

## 2021-10-25 DIAGNOSIS — J479 Bronchiectasis, uncomplicated: Secondary | ICD-10-CM | POA: Diagnosis present

## 2021-10-25 DIAGNOSIS — Z66 Do not resuscitate: Secondary | ICD-10-CM | POA: Diagnosis present

## 2021-10-25 DIAGNOSIS — R570 Cardiogenic shock: Secondary | ICD-10-CM | POA: Diagnosis not present

## 2021-10-25 DIAGNOSIS — Z96641 Presence of right artificial hip joint: Secondary | ICD-10-CM | POA: Diagnosis present

## 2021-10-25 DIAGNOSIS — I130 Hypertensive heart and chronic kidney disease with heart failure and stage 1 through stage 4 chronic kidney disease, or unspecified chronic kidney disease: Secondary | ICD-10-CM | POA: Diagnosis not present

## 2021-10-25 DIAGNOSIS — I700 Atherosclerosis of aorta: Secondary | ICD-10-CM | POA: Diagnosis not present

## 2021-10-25 DIAGNOSIS — I4891 Unspecified atrial fibrillation: Secondary | ICD-10-CM | POA: Diagnosis not present

## 2021-10-25 DIAGNOSIS — W19XXXA Unspecified fall, initial encounter: Secondary | ICD-10-CM | POA: Diagnosis not present

## 2021-10-25 DIAGNOSIS — S72102A Unspecified trochanteric fracture of left femur, initial encounter for closed fracture: Secondary | ICD-10-CM | POA: Diagnosis not present

## 2021-10-25 DIAGNOSIS — N400 Enlarged prostate without lower urinary tract symptoms: Secondary | ICD-10-CM | POA: Diagnosis present

## 2021-10-25 DIAGNOSIS — N183 Chronic kidney disease, stage 3 unspecified: Secondary | ICD-10-CM | POA: Diagnosis not present

## 2021-10-25 DIAGNOSIS — D62 Acute posthemorrhagic anemia: Secondary | ICD-10-CM | POA: Diagnosis not present

## 2021-10-25 DIAGNOSIS — Z8249 Family history of ischemic heart disease and other diseases of the circulatory system: Secondary | ICD-10-CM

## 2021-10-25 DIAGNOSIS — F32A Depression, unspecified: Secondary | ICD-10-CM | POA: Diagnosis present

## 2021-10-25 DIAGNOSIS — R29898 Other symptoms and signs involving the musculoskeletal system: Secondary | ICD-10-CM | POA: Diagnosis not present

## 2021-10-25 DIAGNOSIS — R918 Other nonspecific abnormal finding of lung field: Secondary | ICD-10-CM | POA: Diagnosis not present

## 2021-10-25 DIAGNOSIS — Z7401 Bed confinement status: Secondary | ICD-10-CM | POA: Diagnosis not present

## 2021-10-25 DIAGNOSIS — Z7951 Long term (current) use of inhaled steroids: Secondary | ICD-10-CM

## 2021-10-25 DIAGNOSIS — Z818 Family history of other mental and behavioral disorders: Secondary | ICD-10-CM

## 2021-10-25 DIAGNOSIS — I480 Paroxysmal atrial fibrillation: Secondary | ICD-10-CM | POA: Diagnosis not present

## 2021-10-25 DIAGNOSIS — Z79899 Other long term (current) drug therapy: Secondary | ICD-10-CM

## 2021-10-25 DIAGNOSIS — J90 Pleural effusion, not elsewhere classified: Secondary | ICD-10-CM | POA: Diagnosis not present

## 2021-10-25 DIAGNOSIS — R0602 Shortness of breath: Secondary | ICD-10-CM | POA: Diagnosis not present

## 2021-10-25 DIAGNOSIS — I2510 Atherosclerotic heart disease of native coronary artery without angina pectoris: Secondary | ICD-10-CM | POA: Diagnosis not present

## 2021-10-25 DIAGNOSIS — R079 Chest pain, unspecified: Secondary | ICD-10-CM | POA: Diagnosis not present

## 2021-10-25 DIAGNOSIS — Z95818 Presence of other cardiac implants and grafts: Secondary | ICD-10-CM

## 2021-10-25 DIAGNOSIS — I9789 Other postprocedural complications and disorders of the circulatory system, not elsewhere classified: Secondary | ICD-10-CM | POA: Diagnosis not present

## 2021-10-25 DIAGNOSIS — M25552 Pain in left hip: Secondary | ICD-10-CM | POA: Diagnosis not present

## 2021-10-25 DIAGNOSIS — K219 Gastro-esophageal reflux disease without esophagitis: Secondary | ICD-10-CM | POA: Diagnosis present

## 2021-10-25 DIAGNOSIS — I5043 Acute on chronic combined systolic (congestive) and diastolic (congestive) heart failure: Secondary | ICD-10-CM | POA: Diagnosis not present

## 2021-10-25 DIAGNOSIS — M79606 Pain in leg, unspecified: Secondary | ICD-10-CM | POA: Diagnosis not present

## 2021-10-25 DIAGNOSIS — Z7982 Long term (current) use of aspirin: Secondary | ICD-10-CM

## 2021-10-25 DIAGNOSIS — R531 Weakness: Secondary | ICD-10-CM | POA: Diagnosis not present

## 2021-10-25 DIAGNOSIS — Z7189 Other specified counseling: Secondary | ICD-10-CM | POA: Diagnosis not present

## 2021-10-25 HISTORY — PX: INTRAMEDULLARY (IM) NAIL INTERTROCHANTERIC: SHX5875

## 2021-10-25 LAB — CBC WITH DIFFERENTIAL/PLATELET
Abs Immature Granulocytes: 0.02 10*3/uL (ref 0.00–0.07)
Basophils Absolute: 0 10*3/uL (ref 0.0–0.1)
Basophils Relative: 1 %
Eosinophils Absolute: 0.2 10*3/uL (ref 0.0–0.5)
Eosinophils Relative: 3 %
HCT: 39.7 % (ref 39.0–52.0)
Hemoglobin: 13.1 g/dL (ref 13.0–17.0)
Immature Granulocytes: 0 %
Lymphocytes Relative: 21 %
Lymphs Abs: 1.5 10*3/uL (ref 0.7–4.0)
MCH: 30.4 pg (ref 26.0–34.0)
MCHC: 33 g/dL (ref 30.0–36.0)
MCV: 92.1 fL (ref 80.0–100.0)
Monocytes Absolute: 0.5 10*3/uL (ref 0.1–1.0)
Monocytes Relative: 6 %
Neutro Abs: 5.1 10*3/uL (ref 1.7–7.7)
Neutrophils Relative %: 69 %
Platelets: 154 10*3/uL (ref 150–400)
RBC: 4.31 MIL/uL (ref 4.22–5.81)
RDW: 14.1 % (ref 11.5–15.5)
WBC: 7.3 10*3/uL (ref 4.0–10.5)
nRBC: 0 % (ref 0.0–0.2)

## 2021-10-25 LAB — BASIC METABOLIC PANEL
Anion gap: 7 (ref 5–15)
BUN: 22 mg/dL (ref 8–23)
CO2: 25 mmol/L (ref 22–32)
Calcium: 8.9 mg/dL (ref 8.9–10.3)
Chloride: 111 mmol/L (ref 98–111)
Creatinine, Ser: 1.03 mg/dL (ref 0.61–1.24)
GFR, Estimated: >60 mL/min (ref >60)
Glucose, Bld: 130 mg/dL — ABNORMAL HIGH (ref 70–99)
Potassium: 3.8 mmol/L (ref 3.5–5.1)
Sodium: 143 mmol/L (ref 135–145)

## 2021-10-25 LAB — PROTIME-INR
INR: 1 (ref 0.8–1.2)
Prothrombin Time: 13.5 seconds (ref 11.4–15.2)

## 2021-10-25 LAB — MRSA NEXT GEN BY PCR, NASAL: MRSA by PCR Next Gen: NOT DETECTED

## 2021-10-25 LAB — MAGNESIUM: Magnesium: 2.1 mg/dL (ref 1.7–2.4)

## 2021-10-25 LAB — HEMOGLOBIN AND HEMATOCRIT, BLOOD
HCT: 33.3 % — ABNORMAL LOW (ref 39.0–52.0)
HCT: 29.3 % — ABNORMAL LOW (ref 39.0–52.0)
Hemoglobin: 11.1 g/dL — ABNORMAL LOW (ref 13.0–17.0)
Hemoglobin: 9.8 g/dL — ABNORMAL LOW (ref 13.0–17.0)

## 2021-10-25 LAB — GLUCOSE, CAPILLARY: Glucose-Capillary: 155 mg/dL — ABNORMAL HIGH (ref 70–99)

## 2021-10-25 LAB — CBG MONITORING, ED: Glucose-Capillary: 125 mg/dL — ABNORMAL HIGH (ref 70–99)

## 2021-10-25 LAB — ABO/RH: ABO/RH(D): O POS

## 2021-10-25 SURGERY — FIXATION, FRACTURE, INTERTROCHANTERIC, WITH INTRAMEDULLARY ROD
Anesthesia: Spinal | Site: Leg Upper | Laterality: Left

## 2021-10-25 MED ORDER — BUPIVACAINE LIPOSOME 1.3 % IJ SUSP
INTRAMUSCULAR | Status: AC
  Filled 2021-10-25: qty 20

## 2021-10-25 MED ORDER — PROPOFOL 500 MG/50ML IV EMUL
INTRAVENOUS | Status: DC | PRN
  Administered 2021-10-25: 25 ug/kg/min via INTRAVENOUS

## 2021-10-25 MED ORDER — ESMOLOL HCL 100 MG/10ML IV SOLN
INTRAVENOUS | Status: DC | PRN
  Administered 2021-10-25 (×2): 30 mg via INTRAVENOUS
  Administered 2021-10-25 (×2): 20 mg via INTRAVENOUS

## 2021-10-25 MED ORDER — CHLORHEXIDINE GLUCONATE CLOTH 2 % EX PADS
6.0000 | MEDICATED_PAD | Freq: Every day | CUTANEOUS | Status: DC
  Administered 2021-10-25 – 2021-11-02 (×7): 6 via TOPICAL

## 2021-10-25 MED ORDER — PROPOFOL 1000 MG/100ML IV EMUL
INTRAVENOUS | Status: AC
  Filled 2021-10-25: qty 100

## 2021-10-25 MED ORDER — PROMETHAZINE HCL 25 MG/ML IJ SOLN: 6.2500 mg | INTRAMUSCULAR | Status: DC | PRN

## 2021-10-25 MED ORDER — DONEPEZIL HCL 5 MG PO TABS
5.0000 mg | ORAL_TABLET | Freq: Every day | ORAL | Status: DC
  Administered 2021-10-26 – 2021-10-31 (×6): 5 mg
  Filled 2021-10-25 (×6): qty 1

## 2021-10-25 MED ORDER — CEFAZOLIN SODIUM-DEXTROSE 2-4 GM/100ML-% IV SOLN
2.0000 g | Freq: Once | INTRAVENOUS | Status: AC
  Administered 2021-10-25: 2 g via INTRAVENOUS

## 2021-10-25 MED ORDER — PHENYLEPHRINE HCL (PRESSORS) 10 MG/ML IV SOLN
INTRAVENOUS | Status: AC
  Filled 2021-10-25: qty 1

## 2021-10-25 MED ORDER — NITROGLYCERIN 0.4 MG SL SUBL
0.4000 mg | SUBLINGUAL_TABLET | SUBLINGUAL | Status: DC | PRN
  Administered 2021-10-27: 0.4 mg via SUBLINGUAL
  Filled 2021-10-25: qty 1

## 2021-10-25 MED ORDER — MOMETASONE FURO-FORMOTEROL FUM 200-5 MCG/ACT IN AERO: 2.0000 | INHALATION_SPRAY | Freq: Two times a day (BID) | RESPIRATORY_TRACT | Status: DC

## 2021-10-25 MED ORDER — METHOCARBAMOL 1000 MG/10ML IJ SOLN
500.0000 mg | Freq: Four times a day (QID) | INTRAVENOUS | Status: DC | PRN
  Filled 2021-10-25: qty 5

## 2021-10-25 MED ORDER — OXYCODONE HCL 5 MG PO TABS: 5.0000 mg | ORAL_TABLET | Freq: Once | ORAL | Status: DC | PRN

## 2021-10-25 MED ORDER — BUPIVACAINE HCL (PF) 0.5 % IJ SOLN
INTRAMUSCULAR | Status: DC | PRN
  Administered 2021-10-25: 2.5 mL via INTRATHECAL

## 2021-10-25 MED ORDER — PANTOPRAZOLE SODIUM 40 MG PO TBEC: 40.0000 mg | DELAYED_RELEASE_TABLET | Freq: Every day | ORAL | Status: DC

## 2021-10-25 MED ORDER — LACTATED RINGERS IV SOLN: INTRAVENOUS | Status: DC

## 2021-10-25 MED ORDER — SODIUM CHLORIDE 0.9 % IR SOLN
Status: DC | PRN
  Administered 2021-10-25: 500 mL via SURGICAL_CAVITY

## 2021-10-25 MED ORDER — TRANEXAMIC ACID-NACL 1000-0.7 MG/100ML-% IV SOLN
1000.0000 mg | Freq: Once | INTRAVENOUS | Status: AC
Start: 2021-10-25 — End: 2021-10-25
  Administered 2021-10-25: 1000 mg via INTRAVENOUS
  Filled 2021-10-25: qty 100

## 2021-10-25 MED ORDER — HYDROCODONE-ACETAMINOPHEN 5-325 MG PO TABS: 1.0000 | ORAL_TABLET | Freq: Four times a day (QID) | ORAL | Status: DC | PRN

## 2021-10-25 MED ORDER — FUROSEMIDE 10 MG/ML IJ SOLN
20.0000 mg | Freq: Once | INTRAMUSCULAR | Status: DC
Start: 2021-10-25 — End: 2021-10-25

## 2021-10-25 MED ORDER — MORPHINE SULFATE (PF) 2 MG/ML IV SOLN
2.0000 mg | Freq: Once | INTRAVENOUS | Status: AC
  Administered 2021-10-25: 2 mg via INTRAVENOUS
  Filled 2021-10-25: qty 1

## 2021-10-25 MED ORDER — KETOROLAC TROMETHAMINE 15 MG/ML IJ SOLN
15.0000 mg | Freq: Once | INTRAMUSCULAR | Status: AC | PRN
  Administered 2021-10-25: 15 mg via INTRAVENOUS
  Filled 2021-10-25: qty 1

## 2021-10-25 MED ORDER — FUROSEMIDE 40 MG PO TABS: 40.0000 mg | ORAL_TABLET | Freq: Every day | ORAL | Status: DC

## 2021-10-25 MED ORDER — TRANEXAMIC ACID-NACL 1000-0.7 MG/100ML-% IV SOLN
INTRAVENOUS | Status: AC
  Filled 2021-10-25: qty 100

## 2021-10-25 MED ORDER — PHENYLEPHRINE HCL-NACL 20-0.9 MG/250ML-% IV SOLN
25.0000 ug/min | INTRAVENOUS | Status: DC
  Administered 2021-10-26: 20 ug/min via INTRAVENOUS
  Administered 2021-10-27 – 2021-10-29 (×3): 25 ug/min via INTRAVENOUS
  Administered 2021-10-29: 60 ug/min via INTRAVENOUS
  Administered 2021-10-30: 25 ug/min via INTRAVENOUS
  Filled 2021-10-25 (×8): qty 250

## 2021-10-25 MED ORDER — SERTRALINE HCL 50 MG PO TABS
25.0000 mg | ORAL_TABLET | Freq: Every day | ORAL | Status: DC
  Administered 2021-10-27 – 2021-11-04 (×9): 25 mg via ORAL
  Filled 2021-10-25 (×9): qty 1

## 2021-10-25 MED ORDER — METOPROLOL SUCCINATE ER 50 MG PO TB24: 50.0000 mg | ORAL_TABLET | Freq: Every day | ORAL | Status: DC

## 2021-10-25 MED ORDER — PHENYLEPHRINE HCL (PRESSORS) 10 MG/ML IV SOLN: INTRAVENOUS | Status: DC | PRN

## 2021-10-25 MED ORDER — PHENYLEPHRINE 80 MCG/ML (10ML) SYRINGE FOR IV PUSH (FOR BLOOD PRESSURE SUPPORT)
PREFILLED_SYRINGE | INTRAVENOUS | Status: DC | PRN
  Administered 2021-10-25: 80 ug via INTRAVENOUS
  Administered 2021-10-25: 160 ug via INTRAVENOUS
  Administered 2021-10-25 (×3): 80 ug via INTRAVENOUS
  Administered 2021-10-25: 160 ug via INTRAVENOUS
  Administered 2021-10-25: 80 ug via INTRAVENOUS

## 2021-10-25 MED ORDER — FENTANYL CITRATE (PF) 100 MCG/2ML IJ SOLN
INTRAMUSCULAR | Status: AC
  Filled 2021-10-25: qty 2

## 2021-10-25 MED ORDER — TRANEXAMIC ACID-NACL 1000-0.7 MG/100ML-% IV SOLN
INTRAVENOUS | Status: DC | PRN
  Administered 2021-10-25: 1000 mg via INTRAVENOUS

## 2021-10-25 MED ORDER — FENTANYL CITRATE (PF) 100 MCG/2ML IJ SOLN: 25.0000 ug | INTRAMUSCULAR | Status: DC | PRN

## 2021-10-25 MED ORDER — LATANOPROST 0.005 % OP SOLN
1.0000 [drp] | Freq: Every day | OPHTHALMIC | Status: DC
  Administered 2021-10-26 – 2021-11-03 (×9): 1 [drp] via OPHTHALMIC
  Filled 2021-10-25: qty 2.5

## 2021-10-25 MED ORDER — PROPOFOL 10 MG/ML IV BOLUS
INTRAVENOUS | Status: AC
  Filled 2021-10-25: qty 20

## 2021-10-25 MED ORDER — KETAMINE HCL 50 MG/5ML IJ SOSY
PREFILLED_SYRINGE | INTRAMUSCULAR | Status: AC
  Filled 2021-10-25: qty 5

## 2021-10-25 MED ORDER — ACETAMINOPHEN 325 MG PO TABS: 650.0000 mg | ORAL_TABLET | ORAL | Status: DC | PRN

## 2021-10-25 MED ORDER — 0.9 % SODIUM CHLORIDE (POUR BTL) OPTIME
TOPICAL | Status: DC | PRN
  Administered 2021-10-25: 1000 mL

## 2021-10-25 MED ORDER — METHOCARBAMOL 500 MG PO TABS: 500.0000 mg | ORAL_TABLET | Freq: Four times a day (QID) | ORAL | Status: DC | PRN

## 2021-10-25 MED ORDER — ROSUVASTATIN CALCIUM 10 MG PO TABS
10.0000 mg | ORAL_TABLET | Freq: Every day | ORAL | Status: DC
  Administered 2021-10-27 – 2021-11-04 (×9): 10 mg via ORAL
  Filled 2021-10-25 (×5): qty 1
  Filled 2021-10-25: qty 0.5
  Filled 2021-10-25 (×2): qty 1
  Filled 2021-10-25: qty 0.5
  Filled 2021-10-25 (×2): qty 1

## 2021-10-25 MED ORDER — SENNA 8.6 MG PO TABS
1.0000 | ORAL_TABLET | Freq: Two times a day (BID) | ORAL | Status: DC
  Administered 2021-10-26 – 2021-11-04 (×14): 8.6 mg via ORAL
  Filled 2021-10-25 (×14): qty 1

## 2021-10-25 MED ORDER — SODIUM CHLORIDE 0.9 % IV SOLN: 50.0000 ug/min | INTRAVENOUS | Status: DC

## 2021-10-25 MED ORDER — SODIUM CHLORIDE 0.9 % IV BOLUS
500.0000 mL | Freq: Once | INTRAVENOUS | Status: AC
  Administered 2021-10-25: 500 mL via INTRAVENOUS

## 2021-10-25 MED ORDER — LISINOPRIL 5 MG PO TABS: 2.5000 mg | ORAL_TABLET | Freq: Every day | ORAL | Status: DC

## 2021-10-25 MED ORDER — ACETAMINOPHEN 500 MG PO TABS: 1000.0000 mg | ORAL_TABLET | Freq: Once | ORAL | Status: DC

## 2021-10-25 MED ORDER — PHENYLEPHRINE HCL-NACL 20-0.9 MG/250ML-% IV SOLN
INTRAVENOUS | Status: DC | PRN
  Administered 2021-10-25: 30 ug/min via INTRAVENOUS

## 2021-10-25 MED ORDER — VITAMIN B-12 1000 MCG PO TABS
1000.0000 ug | ORAL_TABLET | Freq: Every day | ORAL | Status: DC
  Administered 2021-10-27 – 2021-11-01 (×6): 1000 ug
  Filled 2021-10-25 (×6): qty 1

## 2021-10-25 MED ORDER — OXYCODONE HCL 5 MG/5ML PO SOLN: 5.0000 mg | Freq: Once | ORAL | Status: DC | PRN

## 2021-10-25 MED ORDER — SODIUM CHLORIDE 0.9 % IV SOLN: INTRAVENOUS | Status: DC | PRN

## 2021-10-25 MED ORDER — KETAMINE HCL 10 MG/ML IJ SOLN
INTRAMUSCULAR | Status: DC | PRN
  Administered 2021-10-25 (×5): 10 mg via INTRAVENOUS

## 2021-10-25 MED ORDER — POTASSIUM CHLORIDE CRYS ER 10 MEQ PO TBCR
10.0000 meq | EXTENDED_RELEASE_TABLET | Freq: Every day | ORAL | Status: DC
  Filled 2021-10-25: qty 1

## 2021-10-25 MED ORDER — CEFAZOLIN SODIUM-DEXTROSE 2-4 GM/100ML-% IV SOLN
INTRAVENOUS | Status: AC
  Administered 2021-10-27: 2 g via INTRAVENOUS
  Filled 2021-10-25: qty 100

## 2021-10-25 MED ORDER — LACTATED RINGERS IV BOLUS
500.0000 mL | Freq: Once | INTRAVENOUS | Status: AC
Start: 2021-10-25 — End: 2021-10-26
  Administered 2021-10-25: 500 mL via INTRAVENOUS

## 2021-10-25 MED ORDER — NEOMYCIN-POLYMYXIN B GU 40-200000 IR SOLN
Status: AC
  Filled 2021-10-25: qty 20

## 2021-10-25 MED ORDER — ACETAMINOPHEN 500 MG PO TABS
ORAL_TABLET | ORAL | Status: AC
  Filled 2021-10-25: qty 2

## 2021-10-25 MED ORDER — MORPHINE SULFATE (PF) 2 MG/ML IV SOLN
0.5000 mg | INTRAVENOUS | Status: DC | PRN
  Administered 2021-10-25 – 2021-10-26 (×4): 0.5 mg via INTRAVENOUS
  Filled 2021-10-25 (×4): qty 1

## 2021-10-25 MED ORDER — BUPIVACAINE HCL (PF) 0.5 % IJ SOLN
INTRAMUSCULAR | Status: AC
  Filled 2021-10-25: qty 30

## 2021-10-25 MED ORDER — METOPROLOL SUCCINATE ER 50 MG PO TB24: 150.0000 mg | ORAL_TABLET | Freq: Every day | ORAL | Status: DC

## 2021-10-25 MED ORDER — DROPERIDOL 2.5 MG/ML IJ SOLN
0.6250 mg | Freq: Once | INTRAMUSCULAR | Status: DC | PRN
Start: 2021-10-25 — End: 2021-10-25

## 2021-10-25 MED ORDER — SODIUM CHLORIDE 0.9 % IV SOLN: 250.0000 mL | INTRAVENOUS | Status: DC

## 2021-10-25 MED ORDER — FERROUS SULFATE 325 (65 FE) MG PO TABS
325.0000 mg | ORAL_TABLET | Freq: Every day | ORAL | Status: DC
  Administered 2021-10-27 – 2021-11-04 (×9): 325 mg via ORAL
  Filled 2021-10-25 (×9): qty 1

## 2021-10-25 SURGICAL SUPPLY — 47 items
BIT DRILL SHORT 4.0 (BIT) IMPLANT
BLADE SURG 15 STRL LF DISP TIS (BLADE) ×1 IMPLANT
BLADE SURG 15 STRL SS (BLADE) ×1
CHLORAPREP W/TINT 26 (MISCELLANEOUS) ×1 IMPLANT
DRAPE 3/4 80X56 (DRAPES) ×1 IMPLANT
DRAPE INCISE IOBAN 66X45 STRL (DRAPES) IMPLANT
DRAPE SURG 17X11 SM STRL (DRAPES) ×2 IMPLANT
DRAPE U-SHAPE 47X51 STRL (DRAPES) ×2 IMPLANT
DRILL BIT SHORT 4.0 (BIT) ×1
DRSG OPSITE POSTOP 3X4 (GAUZE/BANDAGES/DRESSINGS) ×3 IMPLANT
ELECT REM PT RETURN 9FT ADLT (ELECTROSURGICAL) ×1
ELECTRODE REM PT RTRN 9FT ADLT (ELECTROSURGICAL) ×1 IMPLANT
GLOVE BIOGEL PI IND STRL 8 (GLOVE) ×1 IMPLANT
GLOVE SURG SYN 7.5  E (GLOVE) ×1
GLOVE SURG SYN 7.5 E (GLOVE) ×1 IMPLANT
GLOVE SURG SYN 7.5 PF PI (GLOVE) ×1 IMPLANT
GOWN STRL REUS W/ TWL LRG LVL3 (GOWN DISPOSABLE) ×1 IMPLANT
GOWN STRL REUS W/ TWL XL LVL3 (GOWN DISPOSABLE) ×1 IMPLANT
GOWN STRL REUS W/TWL LRG LVL3 (GOWN DISPOSABLE) ×1
GOWN STRL REUS W/TWL XL LVL3 (GOWN DISPOSABLE) ×1
GUIDE PIN 3.2X343 (PIN) ×3
GUIDE PIN 3.2X343MM (PIN) ×3
GUIDE ROD 3.0 (MISCELLANEOUS) ×1
KIT PATIENT CARE HANA TABLE (KITS) ×1 IMPLANT
KIT TURNOVER KIT A (KITS) ×1 IMPLANT
MANIFOLD NEPTUNE II (INSTRUMENTS) ×1 IMPLANT
MAT ABSORB  FLUID 56X50 GRAY (MISCELLANEOUS) ×2
MAT ABSORB FLUID 56X50 GRAY (MISCELLANEOUS) ×2 IMPLANT
NAIL TRIGEN 10X42CM 125D LT (Nail) IMPLANT
NDL FILTER BLUNT 18X1 1/2 (NEEDLE) ×1 IMPLANT
NEEDLE FILTER BLUNT 18X 1/2SAF (NEEDLE) ×1
NEEDLE FILTER BLUNT 18X1 1/2 (NEEDLE) ×1 IMPLANT
NEEDLE HYPO 22GX1.5 SAFETY (NEEDLE) ×1 IMPLANT
NS IRRIG 1000ML POUR BTL (IV SOLUTION) ×1 IMPLANT
PACK HIP COMPR (MISCELLANEOUS) ×1 IMPLANT
PIN GUIDE 3.2X343MM (PIN) IMPLANT
ROD GUIDE 3.0 (MISCELLANEOUS) IMPLANT
SCREW LAG COMPR KIT 105/100 (Screw) IMPLANT
SCREW TRIGEN LOW PROF 5.0X50 (Screw) IMPLANT
STAPLER SKIN PROX 35W (STAPLE) ×1 IMPLANT
SUT VIC AB 0 CT1 36 (SUTURE) IMPLANT
SUT VIC AB 2-0 CT2 27 (SUTURE) ×1 IMPLANT
SYR 10ML LL (SYRINGE) ×1 IMPLANT
SYR 30ML LL (SYRINGE) ×1 IMPLANT
TAPE CLOTH 3X10 WHT NS LF (GAUZE/BANDAGES/DRESSINGS) ×2 IMPLANT
TRAP FLUID SMOKE EVACUATOR (MISCELLANEOUS) ×1 IMPLANT
WATER STERILE IRR 500ML POUR (IV SOLUTION) ×1 IMPLANT

## 2021-10-25 NOTE — Assessment & Plan Note (Signed)
Stable and not acutely exacerbated Continue as needed bronchodilator therapy as well as inhaled steroids 

## 2021-10-25 NOTE — Assessment & Plan Note (Signed)
Status post mechanical fall with  closed intertrochanteric fracture of left femur Immobilize left lower extremity Pain control Place patient on muscle relaxants Consult orthopedic surgery

## 2021-10-25 NOTE — H&P (Signed)
H&P reviewed. No significant changes noted.  

## 2021-10-25 NOTE — H&P (Signed)
History and Physical    Patient: Jeremy Herrera DOB: [birth year] DOA: 10/25/2021 DOS: the patient was seen and examined on 10/25/2021 PCP: Sofie Hartigan, MD  Patient coming from: Home  Chief Complaint:  Chief Complaint  Patient presents with   Fall   HPI: Jeremy Herrera is a 81 y.o. male with medical history significant for chronic diastolic dysfunction CHF, hypertension, dyslipidemia, A-fib, BPH who presents to the ER via EMS for evaluation of left hip pain following a mechanical fall. Patient states that he was trying to get milk out of the refrigerator when he lost balance and fell striking his left arm against the refrigerator and then landing on his left side.  He was unable to get up due to severe pain in his left hip which he rated an 8 x 10 in intensity at its worst and so he used his life alert and contacted EMS.  He was noted to have an abrasion to his left elbow when EMS arrived and administered 50 mcg of fentanyl in route to the hospital. He did denies feeling dizzy or lightheaded prior to the fall.  He appears short of breath which he and his daughter states is chronic for him.  He denies having any nausea, no vomiting, no abdominal pain, no changes in his bowel habits, no urinary symptoms, no fever, no chills, no cough, no leg swelling, no headache, no blurred vision no focal deficit. X-ray of the left hip shows comminuted, displaced and impacted intratrochanteric fractures of the left hip.Status post right hip total arthroplasty.No displaced fracture or dislocation of the bony pelvis. Orthopedic surgery was consulted by the ER he will be admitted to the hospital for further evaluation.   Review of Systems: As mentioned in the history of present illness. All other systems reviewed and are negative. Past Medical History:  Diagnosis Date   Anemia    BPH (benign prostatic hyperplasia)    CHF (congestive heart failure) (HCC)    Diabetes mellitus without  complication (HCC)    Dysphagia    GERD (gastroesophageal reflux disease)    HLD (hyperlipidemia)    HTN (hypertension)    Irregular heart beat    Nocturia    Urinary retention    Past Surgical History:  Procedure Laterality Date   AMPUTATION TOE Left 05/04/2017   Procedure: AMPUTATION TOE - third left toe;  Surgeon: Samara Deist, DPM;  Location: [facility] ORS;  Service: Podiatry;  Laterality: Left;   bladder patching  1964   HIP SURGERY Right 2014   IRRIGATION AND DEBRIDEMENT FOOT Left 11/14/2016   Procedure: IRRIGATION AND DEBRIDEMENT FOOT;  Surgeon: Samara Deist, DPM;  Location: [facility] ORS;  Service: Podiatry;  Laterality: Left;   TRANSURETHRAL RESECTION OF PROSTATE     Social History:  reports that he has never smoked. He has never used smokeless tobacco. He reports that he does not drink alcohol and does not use drugs.  Allergies  Allergen Reactions   Hydrocodone-Acetaminophen Nausea And Vomiting    Family History  Problem Relation Age of Onset   Ulcers Mother        stomach   GER disease Sister        GERD   Heart attack Father    Diabetes Brother    Depression Brother    Kidney disease Neg Hx    Prostate cancer Neg Hx    Kidney cancer Neg Hx    Bladder Cancer Neg Hx     Prior to Admission  medications   Medication Sig Start Date End Date Taking? Authorizing Provider  Acetaminophen 500 MG capsule SMARTSIG:By Mouth 07/29/19   [provider]  albuterol (VENTOLIN HFA) 108 (90 Base) MCG/ACT inhaler Inhale 1-2 puffs into the lungs every 4 (four) hours as needed.  07/07/18   [provider]  aspirin 81 MG tablet Take 81 mg by mouth daily.    [provider]  bimatoprost (LUMIGAN) 0.01 % SOLN 1 drop nightly    [provider]  clopidogrel (PLAVIX) 75 MG tablet Take by mouth. Patient not taking: Reported on 08/19/2020 11/26/18   [provider]  Cyanocobalamin (B-12) 1000 MCG/ML KIT Inject 1 mL as directed every 30 (thirty) days.     [provider]  donepezil (ARICEPT) 5 MG tablet Take 5 mg by mouth at bedtime.  08/22/17   [provider]  dorzolamide-timolol (COSOPT) 22.3-6.8 MG/ML ophthalmic solution Place 1 drop into both eyes 2 (two) times daily.     [provider]  EMOLLIENT EX Apply topically daily.     [provider]  esomeprazole (NEXIUM) 20 MG capsule Take 20 mg by mouth daily at 12 noon.    [provider]  ferrous sulfate 325 (65 FE) MG tablet Take 325 mg by mouth daily.    [provider]  Fluticasone-Salmeterol (ADVAIR) 250-50 MCG/DOSE AEPB Inhale 1 puff into the lungs as needed.     [provider]  furosemide (LASIX) 40 MG tablet Take 40 mg by mouth daily. 04/15/19   [provider]  ketoconazole (NIZORAL) 2 % cream Apply 1 application topically daily as needed.  08/31/18   [provider]  lisinopril (ZESTRIL) 2.5 MG tablet Take 2.5 mg by mouth daily.  07/07/18   [provider]  metoprolol succinate (TOPROL-XL) 50 MG 24 hr tablet Take 150 mg by mouth daily.  07/07/18   [provider]  nitroGLYCERIN (NITROSTAT) 0.4 MG SL tablet Place 0.4 mg under the tongue every 5 (five) minutes as needed.  06/17/18   [provider]  potassium chloride (K-DUR) 10 MEQ tablet Take 10 mEq by mouth daily.     [provider]  rosuvastatin (CRESTOR) 10 MG tablet Take 1 tablet by mouth daily. 07/27/20 07/27/21  [provider]  sertraline (ZOLOFT) 25 MG tablet Take 25 mg by mouth daily.  08/31/18   [provider]  simethicone (MYLICON) 037 MG chewable tablet Chew 125 mg by mouth every 6 (six) hours as needed.     [provider]  Skin Protectants, Misc. (REMEDY PHYTOPLEX HYDRAGUARD) CREA Apply topically.    [provider]  umeclidinium-vilanterol (ANORO ELLIPTA) 62.5-25 MCG/INH AEPB Inhale 1 puff into the lungs daily as needed. Takes daily 08/05/18   [provider]  vitamin B-12  (CYANOCOBALAMIN) 1000 MCG tablet Take 1 tablet (1,000 mcg total) by mouth daily. 08/19/20   Earlie Server, MD    Physical Exam: Vitals:   10/25/21 1111 10/25/21 1121 10/25/21 1124 10/25/21 1450  BP:  105/69  106/67  Pulse:  95  87  Resp:  20  20  Temp:   97.6 F (36.4 C)   TempSrc:   Oral   SpO2:  96%  96%  Weight: 102.3 kg     Height: 6' 1"  (1.854 m)      Physical Exam Vitals and nursing note reviewed.  Constitutional:      Comments: Chronically ill-appearing.  Conversational dyspnea  HENT:     Head: Normocephalic and atraumatic.  Nose: Nose normal.     Mouth/Throat:     Mouth: Mucous membranes are moist.  Eyes:     Comments: Pale conjunctiva  Cardiovascular:     Rate and Rhythm: Rhythm irregular.  Pulmonary:     Effort: Pulmonary effort is normal.     Breath sounds: Rales present.     Comments: Crackles at the bases bilaterally Abdominal:     General: Abdomen is flat. Bowel sounds are normal.     Palpations: Abdomen is soft.  Musculoskeletal:     Comments: Decreased range of motion left hip.  Left leg is shortened  Skin:    General: Skin is warm and dry.  Neurological:     Mental Status: He is alert and oriented to person, place, and time.  Psychiatric:        Mood and Affect: Mood normal.        Behavior: Behavior normal.     Data Reviewed: Relevant notes from primary care and specialist visits, past discharge summaries as available in EHR, including Care Everywhere. Prior diagnostic testing as pertinent to current admission diagnoses Updated medications and problem lists for reconciliation ED course, including vitals, labs, imaging, treatment and response to treatment Triage notes, nursing and pharmacy notes and ED provider's notes Notable results as noted in HPI Labs reviewed.  PT 13.5, INR 1.0, sodium 143, potassium 3.8, chloride 111, bicarb 25, glucose 130, BUN 22, creatinine 1.03, calcium 8.9, white count 7.3, hemoglobin 13.1, hematocrit 39.7, MCV 92.1,  platelet count 154 Chest x-ray reviewed by me shows Cardiomegaly with pulmonary vascular congestion and interstitial prominence bilaterally, left greater than right, which could reflect a degree of interstitial edema. Twelve-lead EKG reviewed by me shows atrial fibrillation There are no new results to review at this time.  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 Status post mechanical fall with  closed intertrochanteric fracture of left femur Immobilize left lower extremity Pain control Place patient on muscle relaxants Consult orthopedic surgery  Atrial fibrillation, chronic (HCC) Patient not on long-term anticoagulation therapy but has a Watchman device in place Rate controlled with metoprolol  COPD (chronic obstructive pulmonary disease) (HCC) Stable and not acutely exacerbated Continue as needed bronchodilator therapy as well as inhaled steroids  Diastolic CHF, chronic ([place]) Patient has a history of chronic diastolic dysfunction CHF with last known 2D echocardiogram showed an LVEF of 50% from 2022 Continue lisinopril and metoprolol Chest x-ray shows findings suggestive of pulmonary vascular congestion We will give 1 dose of Lasix 20 mg IV He is not hypoxic and is short of breath but not changed from his baseline  Depression Continue sertraline     Advance Care Planning:   Code Status: DNR   Consults: Orthopedic surgery  Family Communication: Greater than 50% of time was spent discussing patient's condition and plan of care with him and his daughter at the bedside.  All questions and concerns have been addressed.  They verbalized understanding and agree with the plan.  CODE STATUS was discussed and he is a DO NOT RESUSCITATE  Severity of Illness: The appropriate patient status for this patient is INPATIENT. Inpatient status is judged to be reasonable and necessary in order to provide the required intensity of service  to ensure the patient's safety. The patient's presenting symptoms, physical exam findings, and initial radiographic and laboratory data in the context of their chronic comorbidities is felt to place them at high risk for further clinical deterioration. Furthermore, it  is not anticipated that the patient will be medically stable for discharge from the hospital within 2 midnights of admission.   * I certify that at the point of admission it is my clinical judgment that the patient will require inpatient hospital care spanning beyond 2 midnights from the point of admission due to high intensity of service, high risk for further deterioration and high frequency of surveillance required.*  Author: Collier Bullock, MD 10/25/2021 2:53 PM  For on call review [email or web address].

## 2021-10-25 NOTE — Assessment & Plan Note (Addendum)
Patient not on long-term anticoagulation therapy but has a Watchman device in place Rate controlled with metoprolol

## 2021-10-25 NOTE — Progress Notes (Incomplete)
       CROSS COVER NOTE  NAME: Jeremy Herrera MRN: [id number] DOB : 26-Jan-1941    Date of Service   10/25/2021   HPI/Events of Note   Notified of patient in PACU requiring ICU level of care. Jeremy Herrera was found to be in AFIB with RVR rate in 140s prior to procedure. Decision was made to proceed without rate control. Anesthesia reports Jeremy Herrera became hypotensive intraop and required vasopressor support throughout procedure and he was unable to be weaned post op in PACU.  Jeremy. Herrera is a 81 year old male with medical history significant for chronic diastolic dysfunction CHF, hypertension, dyslipidemia, A-fib, BPH who presented to [facility] ED after a mechanical fall at home.  He was found to have a comminuted displaced and impacted intertrochanteric fracture of the left hip requiring repair in the OR today.    Interventions   Plan: Continue Neosynephrine PCCM consulted LR fluid bolus Rate control as BP allows      This document was prepared using Dragon voice recognition software and may include unintentional dictation errors.  Bishop Limbo DNP, MHA, FNP-BC Nurse Practitioner [facility] Pager [phone]

## 2021-10-25 NOTE — Consult Note (Signed)
NAME:  Jeremy Herrera, MRN:  [id number], DOB:  02-21-1941, LOS: 0 ADMISSION DATE:  10/25/2021, CONSULTATION DATE:  10/25/21 REFERRING MD:  Morton Amy, NP, CHIEF COMPLAINT:  Fall  History of Present Illness:  81 yo M presenting to [facility] ED from home via EMS for evaluation of left hip pain post mechanical fall. Patient reports he uses a roller walker to get around and was trying to get milk out of his refrigerator when he lost his balance and fell on his LEFT side. He was unable to get up and used his life alert to contact EMS. Pain at that time was rated as 8/10 and he sustained an abrasion to his Left elbow & forearm. EMS administer 50 mcg of fentanyl en route. The patient admits to a previous fall, but in 2008- nothing else recent. He denies blurred vision, dizziness or feeling light-headed. He denies feeling ill: no nausea/ vomiting/ diarrhea, no poor po intake, no fever/chills, no cough, no weight gain or swelling, no headache, no urinary symptoms. He does admit to chronic intermittent palpitations, chest pressure and weakness.  ED course: Upon arrival imaging consistent with LEFT hip fracture. Orthopedics consulted and took patient urgently to OR for left hip cephalomedullary nailing. TRH consulted for admission. Medications given: Cefazolin, Toradol, morphine, TXA administration Initial Vitals: 97.6, 20, 95, 105/69 & 96% on RA Significant labs: (Labs/ Imaging personally reviewed) I, Domingo Pulse Rust-Chester, AGACNP-BC, personally viewed and interpreted this ECG. EKG Interpretation: Date: 10/25/21, EKG Time: 20:38, Rate: 143, Rhythm: Afib RVR, QRS Axis:  normal, Intervals: a-fib, ST/T Wave abnormalities: none, Narrative Interpretation: A-fib RVR Chemistry: Na+: 143, K+: 3.8, BUN/Cr.: 22/1.03, Serum CO2/ AG: 25/7, Mg: 2.1 Hematology: WBC: 7.3, Hgb: 13.1 > 11.1,   CXR 10/25/21: cardiomegaly with mild interstitial prominence bilaterally CT ***: ***  PCCM consulted for admission due to  ***.  Pertinent  Medical History  HFpEF HTN Dyslipidemia A-fib BPH  Significant Hospital Events: Including procedures, antibiotic start and stop dates in addition to other pertinent events     Interim History / Subjective:  ***  Objective   Blood pressure (!) 120/99, pulse 95, temperature 97.6 F (36.4 C), temperature source Axillary, resp. rate 15, height 6' 1"  (1.854 m), weight 97.8 kg, SpO2 99 %.        Intake/Output Summary (Last 24 hours) at 10/25/2021 2118 Last data filed at 10/25/2021 1904 Gross per 24 hour  Intake 1200 ml  Output 100 ml  Net 1100 ml   Filed Weights   10/25/21 1111 10/25/21 1525 10/25/21 2105  Weight: 102.3 kg 93 kg 97.8 kg    Examination: General: Adult ***, critically***acutely ill, lying in bed intubated & sedated requiring mechanical ventilation *** NAD HEENT: MM pink/moist, anicteric***, atraumatic, neck supple Neuro: A&O x *** commands, PERRL *** , MAE CV: s1s2 ***RRR, *** on monitor, no r/m/g Pulm: Regular, non labored on *** , breath sounds ***-BUL & ***-BLL GI: soft, ***, non***tender, bs x 4 GU: foley in place *** with clear yellow urine Skin: *** no rashes/lesions noted Extremities: warm/dry, pulses + 2 R/P, *** edema noted  Resolved Hospital Problem list     Assessment & Plan:  Chronic / Paroxysmal Atrial Fibrillation ***with Rapid Ventricular Response ***CHA2DS2-VASc score: - Cardizem infusion - Amiodarone infusion - Heparin drip/***Lovenox per pharmacy consult - f/u TSH & thyroid panel - Echocardiogram ordered - Cardiology consulted, appreciate input - Continuous cardiac monitoring - f/u Mg  Hypotension   Best Practice (right click and "Reselect all SmartList  Selections" daily)  Diet/type: {diet type:25684} DVT prophylaxis: {anticoagulation (Optional):25687} GI prophylaxis: {HW:29937} Lines: {Central Venous Access:25771} Foley:  {Central Venous Access:25691} Code Status:  {Code Status:26939} Last date of  multidisciplinary goals of care discussion [***]  Labs   CBC: Recent Labs  Lab 10/25/21 1125 10/25/21 2014  WBC 7.3  --   NEUTROABS 5.1  --   HGB 13.1 11.1*  HCT 39.7 33.3*  MCV 92.1  --   PLT 154  --     Basic Metabolic Panel: Recent Labs  Lab 10/25/21 1125  NA 143  K 3.8  CL 111  CO2 25  GLUCOSE 130*  BUN 22  CREATININE 1.03  CALCIUM 8.9   GFR: Estimated Creatinine Clearance: 68.1 mL/min (by C-G formula based on SCr of 1.03 mg/dL). Recent Labs  Lab 10/25/21 1125  WBC 7.3    Liver Function Tests: No results for input(s): "AST", "ALT", "ALKPHOS", "BILITOT", "PROT", "ALBUMIN" in the last 168 hours. No results for input(s): "LIPASE", "AMYLASE" in the last 168 hours. No results for input(s): "AMMONIA" in the last 168 hours.  ABG No results found for: "PHART", "PCO2ART", "PO2ART", "HCO3", "TCO2", "ACIDBASEDEF", "O2SAT"   Coagulation Profile: Recent Labs  Lab 10/25/21 1125  INR 1.0    Cardiac Enzymes: No results for input(s): "CKTOTAL", "CKMB", "CKMBINDEX", "TROPONINI" in the last 168 hours.  HbA1C: Hgb A1c MFr Bld  Date/Time Value Ref Range Status  11/14/2016 05:25 AM 5.3 4.8 - 5.6 % Final    Comment:    (NOTE) Pre diabetes:          5.7%-6.4% Diabetes:              >6.4% Glycemic control for   <7.0% adults with diabetes     CBG: Recent Labs  Lab 10/25/21 1906 10/25/21 2104  GLUCAP 125* 155*    Review of Systems: Positives in BOLD  Gen: Denies fever, chills, weight change, fatigue, night sweats HEENT: Denies blurred vision, double vision, hearing loss, tinnitus, sinus congestion, rhinorrhea, sore throat, neck stiffness, dysphagia PULM: Denies shortness of breath, cough, sputum production, hemoptysis, wheezing CV: Denies chest pain, edema, orthopnea, paroxysmal nocturnal dyspnea, palpitations, chest pressure (intermittent and baseline per pt.)  GI: Denies abdominal pain, nausea, vomiting, diarrhea, hematochezia, melena, constipation, change  in bowel habits GU: Denies dysuria, hematuria, polyuria, oliguria, urethral discharge Endocrine: Denies hot or cold intolerance, polyuria, polyphagia or appetite change Derm: Denies rash, dry skin, scaling or peeling skin change Heme: Denies easy bruising, bleeding, bleeding gums Neuro: Denies headache, numbness, weakness (baseline), slurred speech, loss of memory or consciousness  Past Medical History:  He,  has a past medical history of Anemia, BPH (benign prostatic hyperplasia), CHF (congestive heart failure) ([place]), Diabetes mellitus without complication ([place]), Dysphagia, GERD (gastroesophageal reflux disease), HLD (hyperlipidemia), HTN (hypertension), Irregular heart beat, Nocturia, and Urinary retention.   Surgical History:   Past Surgical History:  Procedure Laterality Date   AMPUTATION TOE Left 05/04/2017   Procedure: AMPUTATION TOE - third left toe;  Surgeon: Samara Deist, DPM;  Location: [facility] ORS;  Service: Podiatry;  Laterality: Left;   bladder patching  1964   HIP SURGERY Right 2014   IRRIGATION AND DEBRIDEMENT FOOT Left 11/14/2016   Procedure: IRRIGATION AND DEBRIDEMENT FOOT;  Surgeon: Samara Deist, DPM;  Location: [facility] ORS;  Service: Podiatry;  Laterality: Left;   TRANSURETHRAL RESECTION OF PROSTATE       Social History:   reports that he has never smoked. He has never used smokeless tobacco. He reports  that he does not drink alcohol and does not use drugs.   Family History:  His family history includes Depression in his brother; Diabetes in his brother; GER disease in his sister; Heart attack in his father; Ulcers in his mother. There is no history of Kidney disease, Prostate cancer, Kidney cancer, or Bladder Cancer.   Allergies Allergies  Allergen Reactions   Hydrocodone-Acetaminophen Nausea And Vomiting     Home Medications  Prior to Admission medications   Medication Sig Start Date End Date Taking? Authorizing Provider  Acetaminophen 500 MG capsule SMARTSIG:By  Mouth 07/29/19   [provider]  albuterol (VENTOLIN HFA) 108 (90 Base) MCG/ACT inhaler Inhale 1-2 puffs into the lungs every 4 (four) hours as needed.  07/07/18   [provider]  aspirin 81 MG tablet Take 81 mg by mouth daily.    [provider]  bimatoprost (LUMIGAN) 0.01 % SOLN 1 drop nightly    [provider]  clopidogrel (PLAVIX) 75 MG tablet Take by mouth. Patient not taking: Reported on 08/19/2020 11/26/18   [provider]  Cyanocobalamin (B-12) 1000 MCG/ML KIT Inject 1 mL as directed every 30 (thirty) days.    [provider]  donepezil (ARICEPT) 5 MG tablet Take 5 mg by mouth at bedtime.  08/22/17   [provider]  dorzolamide-timolol (COSOPT) 22.3-6.8 MG/ML ophthalmic solution Place 1 drop into both eyes 2 (two) times daily.     [provider]  EMOLLIENT EX Apply topically daily.     [provider]  esomeprazole (NEXIUM) 20 MG capsule Take 20 mg by mouth daily at 12 noon.    [provider]  ferrous sulfate 325 (65 FE) MG tablet Take 325 mg by mouth daily.    [provider]  Fluticasone-Salmeterol (ADVAIR) 250-50 MCG/DOSE AEPB Inhale 1 puff into the lungs as needed.     [provider]  furosemide (LASIX) 40 MG tablet Take 40 mg by mouth daily. 04/15/19   [provider]  ketoconazole (NIZORAL) 2 % cream Apply 1 application topically daily as needed.  08/31/18   [provider]  lisinopril (ZESTRIL) 2.5 MG tablet Take 2.5 mg by mouth daily.  07/07/18   [provider]  metoprolol succinate (TOPROL-XL) 50 MG 24 hr tablet Take 150 mg by mouth daily.  07/07/18   [provider]  nitroGLYCERIN (NITROSTAT) 0.4 MG SL tablet Place 0.4 mg under the tongue every 5 (five) minutes as needed.  06/17/18   [provider]  potassium chloride (K-DUR) 10 MEQ tablet Take 10 mEq by mouth daily.     [provider]  rosuvastatin (CRESTOR) 10 MG tablet  Take 1 tablet by mouth daily. 07/27/20 07/27/21  [provider]  sertraline (ZOLOFT) 25 MG tablet Take 25 mg by mouth daily.  08/31/18   [provider]  simethicone (MYLICON) 427 MG chewable tablet Chew 125 mg by mouth every 6 (six) hours as needed.     [provider]  Skin Protectants, Misc. (REMEDY PHYTOPLEX HYDRAGUARD) CREA Apply topically.    [provider]  umeclidinium-vilanterol (ANORO ELLIPTA) 62.5-25 MCG/INH AEPB Inhale 1 puff into the lungs daily as needed. Takes daily 08/05/18   [provider]  vitamin B-12 (CYANOCOBALAMIN) 1000 MCG tablet Take 1 tablet (1,000 mcg total) by mouth daily. 08/19/20   Earlie Server, MD     Critical care time: ***       Domingo Pulse Rust-[place], AGACNP-BC Acute Care Nurse Practitioner [facility]   [phone] / [phone] Please see Amion for pager details.

## 2021-10-25 NOTE — Anesthesia Preprocedure Evaluation (Addendum)
Anesthesia Evaluation  Patient identified by MRN, date of birth, ID band Patient awake    Reviewed: Allergy & Precautions, NPO status , Patient's Chart, lab work & pertinent test results, reviewed documented beta blocker date and time   Airway Mallampati: III  TM Distance: >3 FB Neck ROM: full    Dental  (+) Chipped, Missing, Poor Dentition   Pulmonary shortness of breath,  CXR 10/2021: Cardiomegaly with pulmonary vascular congestion and interstitial prominence bilaterally, left greater than right, which could reflect a degree of interstitial edema   Pulmonary exam normal        Cardiovascular hypertension, Pt. on home beta blockers + CAD, + Peripheral Vascular Disease and +CHF  + dysrhythmias (chronic) Atrial Fibrillation Valvular problems/murmurs: mild AS.  Rhythm:Irregular Rate:Normal  H/o watchman procedure  Atrial fibrillation Abnormal ECG When compared with ECG of 16-May-2019 08:22, PREVIOUS ECG IS PRESENT Confirmed by UNCONFIRMED, DOCTOR ([id number]), editor Fredric Mare, Tammy [phone]) on 10/25/2021 12:47:29 PM  ECHO 2022: Summary  1. The left ventricle is normal in size with mildly increased wall  thickness.  2. The left ventricular systolic function is normal to mildly decreased,  LVEF is visually estimated at 50%.  3. Mitral annular calcification is present (mild).  4. The mitral valve leaflets are mildly thickened with normal leaflet  mobility.  5. There is mild mitral valve regurgitation.  6. The aortic valve is probably trileaflet with moderately thickened  leaflets with moderately reduced excursion.  7. The left atrium is severely dilated in size.  8. The right ventricle is normal in size, with normal systolic function.     Neuro/Psych Peripheral sensory neuropathy  S/p LAMINOTOMY / EXCISION DISK POSTERIOR CERVICAL SPINE 05/2001  Neuromuscular disease negative psych ROS   GI/Hepatic Neg liver ROS,  GERD  Controlled,  Endo/Other  diabetes  Renal/GU Renal InsufficiencyRenal diseasestage 3 chronic kidney disease   History of urinary retention    Musculoskeletal   Abdominal Normal abdominal exam  (+)   Peds  Hematology negative hematology ROS (+)   Anesthesia Other Findings S/P mechanical fall  Past Medical History: No date: Anemia No date: BPH (benign prostatic hyperplasia) No date: CHF (congestive heart failure) (HCC) No date: Diabetes mellitus without complication (HCC) No date: Dysphagia No date: GERD (gastroesophageal reflux disease) No date: HLD (hyperlipidemia) No date: HTN (hypertension) No date: Irregular heart beat No date: Nocturia No date: Urinary retention  Past Surgical History: 05/04/2017: AMPUTATION TOE; Left     Comment:  Procedure: AMPUTATION TOE - third left toe;  Surgeon:               Gwyneth Revels, DPM;  Location: [facility] ORS;  Service:               Podiatry;  Laterality: Left; 1964: bladder patching 2014: HIP SURGERY; Right 11/14/2016: IRRIGATION AND DEBRIDEMENT FOOT; Left     Comment:  Procedure: IRRIGATION AND DEBRIDEMENT FOOT;  Surgeon:               Gwyneth Revels, DPM;  Location: [facility] ORS;  Service:               Podiatry;  Laterality: Left; No date: TRANSURETHRAL RESECTION OF PROSTATE  BMI    Body Mass Index: 29.76 kg/m      Reproductive/Obstetrics negative OB ROS                          Anesthesia Physical Anesthesia Plan  ASA: 3  Anesthesia Plan: Spinal   Post-op Pain Management: Tylenol PO (pre-op)*, Regional block* and Toradol IV (intra-op)*   Induction: Intravenous  PONV Risk Score and Plan: Ondansetron and Dexamethasone  Airway Management Planned: Natural Airway and Simple Face Mask  Additional Equipment:   Intra-op Plan:   Post-operative Plan:   Informed Consent: I have reviewed the patients History and Physical, chart, labs and discussed the procedure including the risks, benefits and  alternatives for the proposed anesthesia with the patient or authorized representative who has indicated his/her understanding and acceptance.   Patient has DNR.  Discussed DNR with patient and Suspend DNR.   Dental advisory given  Plan Discussed with: Anesthesiologist, CRNA and Surgeon  Anesthesia Plan Comments:        Anesthesia Quick Evaluation

## 2021-10-25 NOTE — ED Provider Notes (Signed)
[facility] Provider Note    Event Date/Time   First MD Initiated Contact with Patient 10/25/21 1128     (approximate)   History   Fall   HPI  Jeremy Herrera is a 81 y.o. male who presents to the emergency department today because of concerns for left hip pain after a fall.  The patient states that he was trying to get some coffee earlier today when he turned around and lost his balance and fell onto his left side.  Also scraped his left elbow.  Having pain primarily in the left hip.  Denies head injury.     Physical Exam   Triage Vital Signs: ED Triage Vitals  Enc Vitals Group     BP 10/25/21 1121 105/69     Pulse Rate 10/25/21 1121 95     Resp 10/25/21 1121 20     Temp 10/25/21 1124 97.6 F (36.4 C)     Temp Source 10/25/21 1124 Oral     SpO2 10/25/21 1121 96 %     Weight 10/25/21 1111 225 lb 8.5 oz (102.3 kg)     Height 10/25/21 1111 6\' 1"  (1.854 m)     Head Circumference --      Peak Flow --      Pain Score --      Pain Loc --      Pain Edu? --      Excl. in GC? --     Most recent vital signs: Vitals:   10/25/21 1121 10/25/21 1124  BP: 105/69   Pulse: 95   Resp: 20   Temp:  97.6 F (36.4 C)  SpO2: 96%     General: Awake, alert, oriented. CV:  Good peripheral perfusion. Irregular rhythm. Resp:  Normal effort. Lungs clear. Abd:  No distention.  Other:  Left hip with tenderness to palpation and manipulation   ED Results / Procedures / Treatments   Labs (all labs ordered are listed, but only abnormal results are displayed) Labs Reviewed  BASIC METABOLIC PANEL - Abnormal; Notable for the following components:      Result Value   Glucose, Bld 130 (*)    All other components within normal limits  CBC WITH DIFFERENTIAL/PLATELET  PROTIME-INR     EKG  I, 12/25/21, attending physician, personally viewed and interpreted this EKG  EKG Time: 1244 Rate: 99 Rhythm: atrial fibrillation Axis: normal Intervals: qtc  472 QRS: narrow ST changes: no st elevation Impression: abnormal ekg.    RADIOLOGY I independently interpreted and visualized the left hip. My interpretation: left hip fracture Radiology interpretation:  IMPRESSION:  1. Comminuted, displaced and impacted intratrochanteric fractures of  the left hip.  2. Status post right hip total arthroplasty.  3. No displaced fracture or dislocation of the bony pelvis.      PROCEDURES:  Critical Care performed: No  Procedures   MEDICATIONS ORDERED IN ED: Medications - No data to display   IMPRESSION / MDM / ASSESSMENT AND PLAN / ED COURSE  I reviewed the triage vital signs and the nursing notes.                              Differential diagnosis includes, but is not limited to, hip fracture, hip dislocation, bursitis.  Patient's presentation is most consistent with acute presentation with potential threat to life or bodily function.  Patient presented to the emergency department today because of  concern for left hip pain after a fall. The patient's x-ray is consistent with hip fracture. Discussed with orthopedic surgery. Discussed with Dr. Joylene Igo with the hospitalist service who will plan on admission.  FINAL CLINICAL IMPRESSION(S) / ED DIAGNOSES   Final diagnoses:  Closed displaced intertrochanteric fracture of left femur, initial encounter [facility])     Note:  This document was prepared using Dragon voice recognition software and may include unintentional dictation errors.    Phineas Semen, MD 10/25/21 [id number]

## 2021-10-25 NOTE — Progress Notes (Signed)
Full consult note and discussion with patient to follow later today  Called by ED staff. Imaging reviewed.  - Plan for surgery later today - NPO until OR - Hold anticoagulation - Admit to Hospitalist team.

## 2021-10-25 NOTE — Consult Note (Addendum)
ORTHOPAEDIC CONSULTATION  REQUESTING PHYSICIAN: Collier Bullock, MD  Chief Complaint:   L hip pain  History of Present Illness: Jeremy Herrera is a 81 y.o. male who had a fall earlier today.  The patient noted immediate hip pain and inability to ambulate.  The patient ambulates with a walker at baseline.  The patient lives at home with his daughter. Pain is worse with any sort of movement.  X-rays in the emergency department show a left intertrochanteric hip fracture.  Of note, he has had a prior contralateral right hip fracture sustained in 2008.  He believes he underwent a plate and screw fixation construct.  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  He then underwent a right total hip arthroplasty by Dr. Marry Guan in 2014.  He states he was evaluated by Dr. Marry Guan a few months ago and everything seemed well with the right.  He has no significant pain about the right hip region today.  Past Medical History:  Diagnosis Date   Anemia    BPH (benign prostatic hyperplasia)    CHF (congestive heart failure) (HCC)    Diabetes mellitus without complication (HCC)    Dysphagia    GERD (gastroesophageal reflux disease)    HLD (hyperlipidemia)    HTN (hypertension)    Irregular heart beat    Nocturia    Urinary retention    Past Surgical History:  Procedure Laterality Date   AMPUTATION TOE Left 05/04/2017   Procedure: AMPUTATION TOE - third left toe;  Surgeon: Samara Deist, DPM;  Location: [facility] ORS;  Service: Podiatry;  Laterality: Left;   bladder patching  1964   HIP SURGERY Right 2014   IRRIGATION AND DEBRIDEMENT FOOT Left 11/14/2016   Procedure: IRRIGATION AND DEBRIDEMENT FOOT;  Surgeon: Samara Deist, DPM;  Location: [facility] ORS;  Service: Podiatry;  Laterality: Left;   TRANSURETHRAL RESECTION OF PROSTATE     Social History   Socioeconomic History   Marital status: Widowed    Spouse name: Not on file    Number of children: Not on file   Years of education: Not on file   Highest education level: Not on file  Occupational History   Not on file  Tobacco Use   Smoking status: Never   Smokeless tobacco: Never  Vaping Use   Vaping Use: Never used  Substance and Sexual Activity   Alcohol use: No   Drug use: No   Sexual activity: Not Currently  Other Topics Concern   Not on file  Social History Narrative   Not on file   Social Determinants of Health   Financial Resource Strain: Not on file  Food Insecurity: Not on file  Transportation Needs: Not on file  Physical Activity: Not on file  Stress: Not on file  Social Connections: Not on file   Family History  Problem Relation Age of Onset   Ulcers Mother        stomach   GER disease Sister        GERD   Heart attack Father    Diabetes Brother    Depression Brother    Kidney disease Neg Hx    Prostate cancer Neg Hx    Kidney cancer Neg Hx    Bladder Cancer Neg Hx    Allergies  Allergen Reactions   Hydrocodone-Acetaminophen Nausea And Vomiting   Prior to Admission medications   Medication Sig Start Date End Date Taking? Authorizing Provider  Acetaminophen 500 MG capsule SMARTSIG:By Mouth 07/29/19  [provider]  albuterol (VENTOLIN HFA) 108 (90 Base) MCG/ACT inhaler Inhale 1-2 puffs into the lungs every 4 (four) hours as needed.  07/07/18   [provider]  aspirin 81 MG tablet Take 81 mg by mouth daily.    [provider]  bimatoprost (LUMIGAN) 0.01 % SOLN 1 drop nightly    [provider]  clopidogrel (PLAVIX) 75 MG tablet Take by mouth. Patient not taking: Reported on 08/19/2020 11/26/18   [provider]  Cyanocobalamin (B-12) 1000 MCG/ML KIT Inject 1 mL as directed every 30 (thirty) days.    [provider]  donepezil (ARICEPT) 5 MG tablet Take 5 mg by mouth at bedtime.  08/22/17   [provider]  dorzolamide-timolol (COSOPT) 22.3-6.8 MG/ML ophthalmic solution  Place 1 drop into both eyes 2 (two) times daily.     [provider]  EMOLLIENT EX Apply topically daily.     [provider]  esomeprazole (NEXIUM) 20 MG capsule Take 20 mg by mouth daily at 12 noon.    [provider]  ferrous sulfate 325 (65 FE) MG tablet Take 325 mg by mouth daily.    [provider]  Fluticasone-Salmeterol (ADVAIR) 250-50 MCG/DOSE AEPB Inhale 1 puff into the lungs as needed.     [provider]  furosemide (LASIX) 40 MG tablet Take 40 mg by mouth daily. 04/15/19   [provider]  ketoconazole (NIZORAL) 2 % cream Apply 1 application topically daily as needed.  08/31/18   [provider]  lisinopril (ZESTRIL) 2.5 MG tablet Take 2.5 mg by mouth daily.  07/07/18   [provider]  metoprolol succinate (TOPROL-XL) 50 MG 24 hr tablet Take 150 mg by mouth daily.  07/07/18   [provider]  nitroGLYCERIN (NITROSTAT) 0.4 MG SL tablet Place 0.4 mg under the tongue every 5 (five) minutes as needed.  06/17/18   [provider]  potassium chloride (K-DUR) 10 MEQ tablet Take 10 mEq by mouth daily.     [provider]  rosuvastatin (CRESTOR) 10 MG tablet Take 1 tablet by mouth daily. 07/27/20 07/27/21  [provider]  sertraline (ZOLOFT) 25 MG tablet Take 25 mg by mouth daily.  08/31/18   [provider]  simethicone (MYLICON) 161 MG chewable tablet Chew 125 mg by mouth every 6 (six) hours as needed.     [provider]  Skin Protectants, Misc. (REMEDY PHYTOPLEX HYDRAGUARD) CREA Apply topically.    [provider]  umeclidinium-vilanterol (ANORO ELLIPTA) 62.5-25 MCG/INH AEPB Inhale 1 puff into the lungs daily as needed. Takes daily 08/05/18   [provider]  vitamin B-12 (CYANOCOBALAMIN) 1000 MCG tablet Take 1 tablet (1,000 mcg total) by mouth daily. 08/19/20   Earlie Server, MD   Recent Labs    10/25/21 1125  WBC 7.3  HGB 13.1  HCT 39.7  PLT 154  K 3.8   CL 111  CO2 25  BUN 22  CREATININE 1.03  GLUCOSE 130*  CALCIUM 8.9  INR 1.0   CT CHEST WO CONTRAST  Result Date: 10/25/2021 CLINICAL DATA:  Fall.  Pleural effusion. EXAM: CT CHEST WITHOUT CONTRAST TECHNIQUE: Multidetector CT imaging of the chest was performed following the standard protocol without IV contrast. RADIATION DOSE REDUCTION: This exam was performed according to the departmental dose-optimization program which includes automated exposure control, adjustment of the mA and/or kV according to patient size and/or use of iterative reconstruction technique. COMPARISON:  Radiograph of same day.  CT  scan of Jul 04, 2017. FINDINGS: Cardiovascular: Atherosclerosis of thoracic aorta is noted without aneurysm formation. Aortic valves calcifications are noted. Normal cardiac size. No pericardial effusion. Mild coronary artery calcifications are noted. Mediastinum/Nodes: No enlarged mediastinal or axillary lymph nodes. Thyroid gland, trachea, and esophagus demonstrate no significant findings. Lungs/Pleura: No pneumothorax or pleural effusion is noted. Reticular densities are noted peripherally in both lungs suggesting chronic interstitial lung disease. Interval development of 6 mm nodule seen in left lower lobe best seen on image number 121 of series 3. Multiple patchy airspace opacities are noted in the right lung which may represent multifocal inflammation, or potentially severe fibrosis secondary to chronic scarring or inflammation. Some degree of mosaic pattern is seen in the left lung suggesting multifocal inflammation or air trapping secondary to small airways disease. Some degree of bronchiectasis is noted in both lower lobes. Upper Abdomen: No acute abnormality. Musculoskeletal: No chest wall mass or suspicious bone lesions identified. IMPRESSION: There is interval development of multiple patchy airspace opacities in the right lung which may represent multifocal inflammation or pneumonia, or  potentially severe masslike fibrosis secondary to chronic scarring or inflammation. Some degree of mosaic pattern is also noted in the left lung suggesting multifocal inflammation or air trapping secondary to small airways disease. There are again noted reticular densities noted peripherally throughout both lungs suggesting chronic interstitial lung disease, as well as some degree of bronchiectasis seen in both lower lobes, most consistent with chronic findings. Interval development of small nodule seen in left lower lobe. Non-contrast chest CT at 6-12 months is recommended. If the nodule is stable at time of repeat CT, then future CT at 18-24 months (from today's scan) is considered optional for low-risk patients, but is recommended for high-risk patients. This recommendation follows the consensus statement: Guidelines for Management of Incidental Pulmonary Nodules Detected on CT Images: From the Fleischner Society 2017; Radiology 2017; 284:228-243. Mild coronary artery calcifications are noted. Aortic valve calcifications are noted. Aortic Atherosclerosis (ICD10-I70.0). Electronically Signed   By: Marijo Conception M.D.   On: 10/25/2021 14:56   DG Chest 1 View  Result Date: 10/25/2021 CLINICAL DATA:  Fall EXAM: CHEST  1 VIEW COMPARISON:  Radiograph 05/16/2019 FINDINGS: Enlarged cardiac silhouette. Appendage occlusion device noted. There is pulmonary vascular congestion with interstitial prominent bilaterally, left greater than right. No pleural effusion. No pneumothorax. No acute osseous abnormality identified on frontal radiographs of the chest. IMPRESSION: Cardiomegaly with pulmonary vascular congestion and interstitial prominence bilaterally, left greater than right, which could reflect a degree of interstitial edema. Electronically Signed   By: Maurine Simmering M.D.   On: 10/25/2021 11:57   DG Hip Unilat With Pelvis 2-3 Views Left  Result Date: 10/25/2021 CLINICAL DATA:  Fall, pain EXAM: DG HIP (WITH OR WITHOUT  PELVIS) 2-3V LEFT COMPARISON:  None Available. FINDINGS: Osteopenia. Comminuted, displaced and impacted intratrochanteric fractures of the left hip. Status post right hip total arthroplasty. No displaced fracture or dislocation of the bony pelvis. Nonobstructive pattern of overlying bowel gas. IMPRESSION: 1. Comminuted, displaced and impacted intratrochanteric fractures of the left hip. 2. Status post right hip total arthroplasty. 3. No displaced fracture or dislocation of the bony pelvis. Electronically Signed   By: Delanna Ahmadi M.D.   On: 10/25/2021 11:52     Positive ROS: All other systems have been reviewed and were otherwise negative with the exception of those mentioned in the HPI and as above.  Physical Exam: BP 133/86   Pulse (!) 111  Temp (!) 97 F (36.1 C) (Temporal)   Resp 17   Ht 6' 1"  (1.854 m)   Wt 93 kg   SpO2 100%   BMI 27.05 kg/m  General:  Alert, no acute distress Psychiatric:  Patient is competent for consent with normal mood and affect   Cardiovascular:  No pedal edema, irregular rate and rhythm Respiratory:  No wheezing, non-labored breathing GI:  Abdomen is soft and non-tender Skin:  No lesions in the area of chief complaint, no erythema Neurologic:  Sensation intact distally, CN grossly intact Lymphatic:  No axillary or cervical lymphadenopathy  Orthopedic Exam:  LLE: + DF/PF/EHL SILT grossly over foot Foot wwp +Log roll/axial load   X-rays:  As above: L intertrochanteric hip fracture  Assessment/Plan: LUC SHAMMAS is a 81 y.o. male with a L intertrochanteric hip fracture  1. I discussed the various treatment options including both surgical and non-surgical management of the fracture with the patient and/or family. We discussed the high risk of perioperative complications due to patient's age and other co-morbidities. After discussion of risks, benefits, and alternatives to surgery, the family and/or patient were in agreement to proceed with  surgery. The goals of surgery would be to provide adequate pain relief and allow for mobilization. Plan for surgery is L hip cephalomedullary nailing today, 10/25/2021. 2. NPO until OR 3. Hold anticoagulation in advance of OR     Leim Fabry   10/25/2021 4:18 PM

## 2021-10-25 NOTE — Assessment & Plan Note (Addendum)
Patient has a history of chronic diastolic dysfunction CHF with last known 2D echocardiogram showed an LVEF of 50% from 2022 Continue lisinopril and metoprolol Chest x-ray shows findings suggestive of pulmonary vascular congestion We will give 1 dose of Lasix 20 mg IV He is not hypoxic and is short of breath but not changed from his baseline

## 2021-10-25 NOTE — Progress Notes (Signed)
eLink Physician-Brief Progress Note Patient Name: Jeremy Herrera DOB: 1940-09-26 MRN: [id number]   Date of Service  10/25/2021  HPI/Events of Note  Patient with a fall during which he sustained a left hip fracture, he had a left inter-trochanteric fracture pinning, atrial fib with RVR during the procedure requiring Cardizem gtt, and Phenylephrine gtt for hypotension, patient admitted to ICU post-op for close monitoring.  eICU Interventions  New Patient Evaluation.        Thomasene Lot Fatina Sprankle 10/25/2021, 9:50 PM

## 2021-10-25 NOTE — ED Triage Notes (Signed)
Pt comes via [facility] from home with c/o mechanical fall while feeding cat. Pt denies any thinners or hitting head. Pt does state left hip and leg pain. Pt was 50 fent in route. Abrasion to left elbow.  VSS

## 2021-10-25 NOTE — Transfer of Care (Signed)
Immediate Anesthesia Transfer of Care Note  Patient: Jeremy Herrera  Procedure(s) Performed: INTRAMEDULLARY (IM) NAIL INTERTROCHANTERIC (Left: Leg Upper)  Patient Location: PACU  Anesthesia Type:Spinal  Level of Consciousness: awake, alert  and oriented  Airway & Oxygen Therapy: Patient Spontanous Breathing and Patient connected to face mask oxygen  Post-op Assessment: Report given to RN and Post -op Vital signs reviewed and stable  Post vital signs: Reviewed and stable  Last Vitals:  Vitals Value Taken Time  BP 125/64 10/25/21 1900  Temp    Pulse 125 10/25/21 1902  Resp 21 10/25/21 1903  SpO2 91 % 10/25/21 1902  Vitals shown include unvalidated device data.  Last Pain:  Vitals:   10/25/21 1525  TempSrc: Temporal  PainSc: 5          Complications: No notable events documented.

## 2021-10-25 NOTE — Anesthesia Procedure Notes (Signed)
Spinal  Patient location during procedure: OR Start time: 10/25/2021 4:30 PM End time: 10/25/2021 4:47 PM Reason for block: surgical anesthesia Staffing Performed: resident/CRNA  Anesthesiologist: Piscitello, Precious Haws, MD Resident/CRNA: Nelda Marseille, CRNA Performed by: Nelda Marseille, CRNA Authorized by: Martha Clan, MD   Preanesthetic Checklist Completed: patient identified, IV checked, site marked, risks and benefits discussed, surgical consent, monitors and equipment checked, pre-op evaluation and timeout performed Spinal Block Patient position: right lateral decubitus Prep: ChloraPrep Patient monitoring: heart rate, continuous pulse ox, blood pressure and cardiac monitor Approach: midline Location: L3-4 Injection technique: single-shot Needle Needle type: Whitacre and Introducer  Needle gauge: 25 G Needle length: 9 cm Assessment Sensory level: T10 Events: CSF return Additional Notes Sterile aseptic technique used throughout the procedure.  Negative paresthesia. Negative blood return. Positive free-flowing CSF. Expiration date of kit checked and confirmed. Patient tolerated procedure well, without complications.

## 2021-10-25 NOTE — Op Note (Signed)
DATE OF SURGERY: 10/25/2021  PREOPERATIVE DIAGNOSIS: Left intertrochanteric hip fracture  POSTOPERATIVE DIAGNOSIS: Left intertrochanteric hip fracture  PROCEDURE: Intramedullary nailing of Left femur with cephalomedullary device  SURGEON: Rosealee Albee, MD  ANESTHESIA: spinal  EBL: 100 cc  IVF: per anesthesia record  COMPONENTS:  [employer] Trigen Intertan Long Nail: 10x422mm; lag screw with compression screw; 5x 50mm distal cortical interlocking screw  INDICATIONS: Jeremy Herrera is a 81 y.o. male who sustained an intertrochanteric fracture after a fall. Risks and benefits of intramedullary nailing were explained to the patient and/or family . Risks include but are not limited to bleeding, infection, injury to tissues, nerves, vessels, nonunion/malunion, hardware failure, limb length discrepancy/hip rotation mismatch and risks of anesthesia. The patient and/or family understand these risks, have completed an informed consent, and wish to proceed.   PROCEDURE:  The patient was brought into the operating room. After administering anesthesia, the patient was placed in the supine position on the Hana table. The uninjured leg was placed in an extended position while the injured lower extremity was placed in longitudinal traction. The fracture was reduced using longitudinal traction and internal rotation. The adequacy of reduction was verified fluoroscopically in AP and lateral projections and found to be acceptable. The lateral aspect of the hip and thigh were prepped with ChloraPrep solution before being draped sterilely. Preoperative IV antibiotics were administered. A timeout was performed to verify the appropriate surgical site, patient, and procedure.    The greater trochanter was identified and an approximately 6 cm incision was made about 3 fingerbreadths above the tip of the greater trochanter. The incision was carried down through the subcutaneous tissues to expose the  gluteal fascia. This was split the length of the incision, providing access to the tip of the trochanter. Under fluoroscopic guidance, a guidewire was drilled through the tip of the trochanter into the proximal metaphysis to the level of the lesser trochanter. After verifying its position fluoroscopically in AP and lateral projections, it was overreamed with the opening reamer to the level of the lesser trochanter. A guidewire was passed down through the femoral canal to the supracondylar region. The guidewire was overreamed sequentially using the flexible reamers. The nail was selected and advanced to the appropriate depth as verified fluoroscopically.    The guide system for the lag screw was positioned and advanced through an approximately 8cm incision over the lateral aspect of the proximal femur. Incision was extended prior to reaming to see if we could obtain a more anatomic reduction with a bone hook, but the fracture fragments did not move with this maneuver. The guidewire was drilled up through the femoral nail and into the femoral neck to rest within 5 mm of subchondral bone. After verifying its position in the femoral neck and head in both AP and lateral projections, the guidewire was measured and appropriate sized lag screw was selected.  The channel for the compression screw was drilled and antirotation bar was placed.  Lag screw was drilled and placed in appropriate position.  Compression screw was then placed.  Appropriate compression was achieved.  The set screw was locked in place. Again, the adequacy of hardware position and fracture reduction was verified fluoroscopically in AP and lateral projections.   Attention was directed distally. Using the "perfect circle" technique, the leg and fluoroscopy machine were positioned appropriately. A 2cm stab incision was made over the skin and IT band at the appropriate point before the drill bit was advanced through  the cortex and across the static hole  of the nail. Appropriate screw length was determined with a measuring guide. A distal interlocking screw was placed. Again, the adequacy of screw position was verified fluoroscopically in AP and lateral projections.   The wounds were irrigated thoroughly with sterile saline solution. Local anesthetic was injected into the wounds. Deep fascia was closed with 0-Vicryl. The subcutaneous tissues were closed using 2-0 Vicryl interrupted sutures. The skin was closed using staples. Sterile occlusive dressings were applied to all wounds. The patient was then transferred to the recovery room in satisfactory condition.   POSTOPERATIVE PLAN: The patient will be WBAT on the operative extremity. Lovenox 40mg /day x 4 weeks to start on POD#1. Perioperative IV antibiotics x 24 hours. PT/OT on POD#1.

## 2021-10-25 NOTE — Assessment & Plan Note (Signed)
Continue sertraline 

## 2021-10-25 NOTE — Anesthesia Procedure Notes (Signed)
Date/Time: 10/25/2021 4:23 PM  Performed by: Junious Silk, CRNAPre-anesthesia Checklist: Patient identified, Emergency Drugs available, Suction available, Patient being monitored and Timeout performed Oxygen Delivery Method: Simple face mask

## 2021-10-25 NOTE — Progress Notes (Signed)
An USGPIV (ultrasound guided PIV) has been placed for short-term vasopressor infusion. A correctly placed ivWatch must be used when administering Vasopressors. Should this treatment be needed beyond 72 hours, central line access should be obtained.  It will be the responsibility of the bedside nurse to follow best practice to prevent extravasations.   ?

## 2021-10-26 ENCOUNTER — Encounter: Payer: Self-pay | Admitting: Orthopedic Surgery

## 2021-10-26 DIAGNOSIS — J449 Chronic obstructive pulmonary disease, unspecified: Secondary | ICD-10-CM | POA: Diagnosis not present

## 2021-10-26 DIAGNOSIS — I9789 Other postprocedural complications and disorders of the circulatory system, not elsewhere classified: Secondary | ICD-10-CM

## 2021-10-26 DIAGNOSIS — I482 Chronic atrial fibrillation, unspecified: Secondary | ICD-10-CM

## 2021-10-26 DIAGNOSIS — E440 Moderate protein-calorie malnutrition: Secondary | ICD-10-CM | POA: Insufficient documentation

## 2021-10-26 DIAGNOSIS — F325 Major depressive disorder, single episode, in full remission: Secondary | ICD-10-CM | POA: Diagnosis not present

## 2021-10-26 DIAGNOSIS — S72142A Displaced intertrochanteric fracture of left femur, initial encounter for closed fracture: Secondary | ICD-10-CM

## 2021-10-26 DIAGNOSIS — I4891 Unspecified atrial fibrillation: Secondary | ICD-10-CM

## 2021-10-26 DIAGNOSIS — I9581 Postprocedural hypotension: Secondary | ICD-10-CM

## 2021-10-26 DIAGNOSIS — I5032 Chronic diastolic (congestive) heart failure: Secondary | ICD-10-CM

## 2021-10-26 LAB — CBC
HCT: 26.1 % — ABNORMAL LOW (ref 39.0–52.0)
Hemoglobin: 8.9 g/dL — ABNORMAL LOW (ref 13.0–17.0)
MCH: 31.2 pg (ref 26.0–34.0)
MCHC: 34.1 g/dL (ref 30.0–36.0)
MCV: 91.6 fL (ref 80.0–100.0)
Platelets: 148 10*3/uL — ABNORMAL LOW (ref 150–400)
RBC: 2.85 MIL/uL — ABNORMAL LOW (ref 4.22–5.81)
RDW: 14.2 % (ref 11.5–15.5)
WBC: 10.9 10*3/uL — ABNORMAL HIGH (ref 4.0–10.5)
nRBC: 0 % (ref 0.0–0.2)

## 2021-10-26 LAB — BASIC METABOLIC PANEL
Anion gap: 4 — ABNORMAL LOW (ref 5–15)
BUN: 22 mg/dL (ref 8–23)
CO2: 23 mmol/L (ref 22–32)
Calcium: 8.1 mg/dL — ABNORMAL LOW (ref 8.9–10.3)
Chloride: 117 mmol/L — ABNORMAL HIGH (ref 98–111)
Creatinine, Ser: 0.97 mg/dL (ref 0.61–1.24)
GFR, Estimated: >60 mL/min (ref >60)
Glucose, Bld: 148 mg/dL — ABNORMAL HIGH (ref 70–99)
Potassium: 4.8 mmol/L (ref 3.5–5.1)
Sodium: 144 mmol/L (ref 135–145)

## 2021-10-26 LAB — LACTIC ACID, PLASMA
Lactic Acid, Venous: 1.4 mmol/L (ref 0.5–1.9)
Lactic Acid, Venous: 2.3 mmol/L (ref 0.5–1.9)

## 2021-10-26 LAB — MAGNESIUM: Magnesium: 1.7 mg/dL (ref 1.7–2.4)

## 2021-10-26 LAB — PHOSPHORUS: Phosphorus: 3.2 mg/dL (ref 2.5–4.6)

## 2021-10-26 LAB — ALBUMIN: Albumin: 2.8 g/dL — ABNORMAL LOW (ref 3.5–5.0)

## 2021-10-26 MED ORDER — BISACODYL 10 MG RE SUPP
10.0000 mg | Freq: Every day | RECTAL | Status: DC | PRN
  Administered 2021-11-03: 10 mg via RECTAL
  Filled 2021-10-26: qty 1

## 2021-10-26 MED ORDER — DIPHENHYDRAMINE HCL 50 MG/ML IJ SOLN
INTRAMUSCULAR | Status: AC
  Filled 2021-10-26: qty 1

## 2021-10-26 MED ORDER — ACETAMINOPHEN 325 MG PO TABS: 650.0000 mg | ORAL_TABLET | Freq: Four times a day (QID) | ORAL | Status: DC

## 2021-10-26 MED ORDER — ENOXAPARIN SODIUM 40 MG/0.4ML IJ SOSY: 40.0000 mg | PREFILLED_SYRINGE | INTRAMUSCULAR | Status: DC

## 2021-10-26 MED ORDER — ONDANSETRON HCL 4 MG PO TABS: 4.0000 mg | ORAL_TABLET | Freq: Four times a day (QID) | ORAL | Status: DC | PRN

## 2021-10-26 MED ORDER — UMECLIDINIUM-VILANTEROL 62.5-25 MCG/ACT IN AEPB
1.0000 | INHALATION_SPRAY | Freq: Every day | RESPIRATORY_TRACT | Status: DC
  Administered 2021-10-26 – 2021-11-04 (×10): 1 via RESPIRATORY_TRACT
  Filled 2021-10-26 (×2): qty 14

## 2021-10-26 MED ORDER — ACETAMINOPHEN 500 MG PO TABS
1000.0000 mg | ORAL_TABLET | Freq: Three times a day (TID) | ORAL | Status: DC
  Administered 2021-10-26 – 2021-11-04 (×27): 1000 mg via ORAL
  Filled 2021-10-26 (×27): qty 2

## 2021-10-26 MED ORDER — METHOCARBAMOL 500 MG PO TABS
500.0000 mg | ORAL_TABLET | Freq: Four times a day (QID) | ORAL | Status: DC | PRN
  Administered 2021-10-29 – 2021-11-04 (×4): 500 mg via ORAL
  Filled 2021-10-26 (×5): qty 1

## 2021-10-26 MED ORDER — MAGNESIUM SULFATE IN D5W 1-5 GM/100ML-% IV SOLN
1.0000 g | Freq: Once | INTRAVENOUS | Status: AC
  Administered 2021-10-26: 1 g via INTRAVENOUS
  Filled 2021-10-26: qty 100

## 2021-10-26 MED ORDER — KETOCONAZOLE 2 % EX CREA
1.0000 | TOPICAL_CREAM | Freq: Every day | CUTANEOUS | Status: DC | PRN
Start: 2021-10-26 — End: 2021-11-04
  Administered 2021-10-26 – 2021-10-28 (×2): 1 via TOPICAL
  Filled 2021-10-26 (×3): qty 15

## 2021-10-26 MED ORDER — LACTATED RINGERS IV SOLN: INTRAVENOUS | Status: AC

## 2021-10-26 MED ORDER — SODIUM CHLORIDE 0.9 % IV SOLN: INTRAVENOUS | Status: DC

## 2021-10-26 MED ORDER — METOPROLOL TARTRATE 25 MG PO TABS: 25.0000 mg | ORAL_TABLET | Freq: Two times a day (BID) | ORAL | Status: DC

## 2021-10-26 MED ORDER — ENOXAPARIN SODIUM 40 MG/0.4ML IJ SOSY
40.0000 mg | PREFILLED_SYRINGE | INTRAMUSCULAR | Status: DC
  Administered 2021-10-26 – 2021-10-27 (×2): 40 mg via SUBCUTANEOUS
  Filled 2021-10-26 (×2): qty 0.4

## 2021-10-26 MED ORDER — DOCUSATE SODIUM 100 MG PO CAPS
100.0000 mg | ORAL_CAPSULE | Freq: Two times a day (BID) | ORAL | Status: DC
  Administered 2021-10-26 – 2021-11-04 (×13): 100 mg via ORAL
  Filled 2021-10-26 (×13): qty 1

## 2021-10-26 MED ORDER — ENSURE ENLIVE PO LIQD
237.0000 mL | Freq: Three times a day (TID) | ORAL | Status: DC
  Administered 2021-10-26 – 2021-11-04 (×17): 237 mL via ORAL

## 2021-10-26 MED ORDER — METHOCARBAMOL 1000 MG/10ML IJ SOLN: 500.0000 mg | Freq: Four times a day (QID) | INTRAVENOUS | Status: DC | PRN

## 2021-10-26 MED ORDER — ADULT MULTIVITAMIN W/MINERALS CH
1.0000 | ORAL_TABLET | Freq: Every day | ORAL | Status: DC
Start: 2021-10-27 — End: 2021-11-04
  Administered 2021-10-27 – 2021-11-04 (×8): 1 via ORAL
  Filled 2021-10-26 (×8): qty 1

## 2021-10-26 MED ORDER — FLEET ENEMA 7-19 GM/118ML RE ENEM
1.0000 | ENEMA | Freq: Once | RECTAL | Status: DC | PRN
Start: 2021-10-26 — End: 2021-11-04

## 2021-10-26 MED ORDER — MAGNESIUM SULFATE 2 GM/50ML IV SOLN
2.0000 g | Freq: Once | INTRAVENOUS | Status: DC
  Filled 2021-10-26: qty 50

## 2021-10-26 MED ORDER — SENNOSIDES-DOCUSATE SODIUM 8.6-50 MG PO TABS: 1.0000 | ORAL_TABLET | Freq: Every evening | ORAL | Status: DC | PRN

## 2021-10-26 MED ORDER — OXYCODONE HCL 5 MG PO TABS
2.5000 mg | ORAL_TABLET | ORAL | Status: DC | PRN
  Administered 2021-10-27 – 2021-11-01 (×2): 5 mg via ORAL
  Filled 2021-10-26: qty 1

## 2021-10-26 MED ORDER — TRAMADOL HCL 50 MG PO TABS
50.0000 mg | ORAL_TABLET | Freq: Four times a day (QID) | ORAL | Status: DC | PRN
  Administered 2021-10-27: 50 mg via ORAL
  Filled 2021-10-26: qty 1

## 2021-10-26 MED ORDER — METOCLOPRAMIDE HCL 5 MG/ML IJ SOLN: 5.0000 mg | Freq: Three times a day (TID) | INTRAMUSCULAR | Status: DC | PRN

## 2021-10-26 MED ORDER — KETOROLAC TROMETHAMINE 15 MG/ML IJ SOLN
7.5000 mg | Freq: Four times a day (QID) | INTRAMUSCULAR | Status: AC
  Administered 2021-10-26 – 2021-10-27 (×3): 7.5 mg via INTRAVENOUS
  Filled 2021-10-26 (×4): qty 1

## 2021-10-26 MED ORDER — METOCLOPRAMIDE HCL 5 MG PO TABS: 5.0000 mg | ORAL_TABLET | Freq: Three times a day (TID) | ORAL | Status: DC | PRN

## 2021-10-26 MED ORDER — METOPROLOL TARTRATE 5 MG/5ML IV SOLN
2.5000 mg | Freq: Four times a day (QID) | INTRAVENOUS | Status: DC
  Administered 2021-10-26 – 2021-10-27 (×4): 2.5 mg via INTRAVENOUS
  Filled 2021-10-26 (×4): qty 5

## 2021-10-26 MED ORDER — ALBUMIN HUMAN 25 % IV SOLN
25.0000 g | Freq: Once | INTRAVENOUS | Status: AC
  Administered 2021-10-26: 25 g via INTRAVENOUS
  Filled 2021-10-26: qty 100

## 2021-10-26 MED ORDER — METOPROLOL TARTRATE 5 MG/5ML IV SOLN
2.5000 mg | Freq: Once | INTRAVENOUS | Status: AC
  Administered 2021-10-26: 2.5 mg via INTRAVENOUS

## 2021-10-26 MED ORDER — METOPROLOL TARTRATE 5 MG/5ML IV SOLN
INTRAVENOUS | Status: AC
  Filled 2021-10-26: qty 5

## 2021-10-26 MED ORDER — DIPHENHYDRAMINE HCL 50 MG/ML IJ SOLN
25.0000 mg | Freq: Once | INTRAMUSCULAR | Status: AC
  Administered 2021-10-26: 25 mg via INTRAVENOUS

## 2021-10-26 MED ORDER — POTASSIUM CHLORIDE 10 MEQ/100ML IV SOLN
10.0000 meq | Freq: Every day | INTRAVENOUS | Status: DC
  Administered 2021-10-27 – 2021-10-31 (×5): 10 meq via INTRAVENOUS
  Filled 2021-10-26 (×7): qty 100

## 2021-10-26 MED ORDER — ONDANSETRON HCL 4 MG/2ML IJ SOLN: 4.0000 mg | Freq: Four times a day (QID) | INTRAMUSCULAR | Status: DC | PRN

## 2021-10-26 MED ORDER — OXYCODONE HCL 5 MG PO TABS
5.0000 mg | ORAL_TABLET | ORAL | Status: DC | PRN
  Administered 2021-10-26: 5 mg via ORAL
  Administered 2021-10-28: 10 mg via ORAL
  Administered 2021-10-28 – 2021-10-31 (×5): 5 mg via ORAL
  Administered 2021-11-01 – 2021-11-02 (×2): 10 mg via ORAL
  Administered 2021-11-02: 5 mg via ORAL
  Administered 2021-11-02 – 2021-11-03 (×4): 10 mg via ORAL
  Filled 2021-10-26 (×2): qty 2
  Filled 2021-10-26: qty 1
  Filled 2021-10-26: qty 2
  Filled 2021-10-26: qty 1
  Filled 2021-10-26 (×2): qty 2
  Filled 2021-10-26 (×5): qty 1
  Filled 2021-10-26 (×3): qty 2

## 2021-10-26 MED ORDER — ACETAMINOPHEN 10 MG/ML IV SOLN
1000.0000 mg | Freq: Four times a day (QID) | INTRAVENOUS | Status: DC
Start: 2021-10-26 — End: 2021-10-26
  Administered 2021-10-26: 1000 mg via INTRAVENOUS
  Filled 2021-10-26 (×2): qty 100

## 2021-10-26 MED ORDER — PANTOPRAZOLE SODIUM 40 MG IV SOLR
40.0000 mg | INTRAVENOUS | Status: DC
  Administered 2021-10-26 – 2021-10-28 (×3): 40 mg via INTRAVENOUS
  Filled 2021-10-26 (×3): qty 10

## 2021-10-26 MED ORDER — CEFAZOLIN SODIUM-DEXTROSE 2-4 GM/100ML-% IV SOLN
2.0000 g | Freq: Four times a day (QID) | INTRAVENOUS | Status: AC
  Administered 2021-10-26 (×2): 2 g via INTRAVENOUS
  Filled 2021-10-26 (×5): qty 100

## 2021-10-26 MED ORDER — ALBUMIN HUMAN 25 % IV SOLN
INTRAVENOUS | Status: AC
  Filled 2021-10-26: qty 50

## 2021-10-26 MED ORDER — HYDROMORPHONE HCL 1 MG/ML IJ SOLN
0.2000 mg | INTRAMUSCULAR | Status: DC | PRN
  Administered 2021-10-26: 0.4 mg via INTRAVENOUS
  Administered 2021-10-27: 0.2 mg via INTRAVENOUS
  Filled 2021-10-26 (×2): qty 1

## 2021-10-26 NOTE — Progress Notes (Signed)
PROGRESS NOTE    Jeremy Herrera   AVW:[id number] DOB: [birth year]  DOA: 10/25/2021 Date of Service: 10/26/21 PCP: Marina Goodell, MD     Brief Narrative / Hospital Course:  Jeremy Herrera is a 81 y.o. male with medical history significant for chronic diastolic dysfunction CHF, hypertension, dyslipidemia, A-fib (BB, Plavix, ASA - NOT on anticoagulation), BPH who presents to the ER from home on 10/25/2021 via EMS for evaluation of left hip pain following a mechanical fall. No syncope. Chronic SOB. Uses roller walker at baseline.  09/05: X-ray of the left hip shows comminuted, displaced and impacted intratrochanteric fractures of the left hip. Status post right hip total arthroplasty. No displaced fracture or dislocation of the bony pelvis. CT chest: interval development of multiple patchy airspace opacities in the right lung which may represent multifocal inflammation or pneumonia, or potentially severe masslike fibrosis secondary to chronic scarring or inflammation. 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 Orthopedic surgery performed intramedullary nailing of L femur with cephalomedullary device. Postoperative Afib RVR requiring vasopressor support, ICU consulted and maintaining phenylephrine gtt.  09/06: tachycardiac and soft BP overnight improving, tachypnea about the same. Hgb slight drop to 8.9 postop. Received albumin overnight. Still on phenylephrine this morning.   Consultants:  Orthopedics (Dr. Signa Kell)  Procedures: 09/05: Repair L hip fracture (intramedullary nailing of L femur with cephalomedullary device)      ASSESSMENT & PLAN:   Principal Problem:   Closed displaced intertrochanteric fracture of left femur (HCC) Active Problems:   Depression   Diastolic CHF, chronic (HCC)   COPD (chronic obstructive pulmonary disease) (HCC)   Atrial fibrillation, chronic (HCC)   Postoperative atrial fibrillation w/ RVR and associated hypotension requiring vasopressor  support in ICU  Closed displaced intertrochanteric fracture of left femur (HCC) Status post mechanical fall with  closed intertrochanteric fracture of left femur Orthopedic surgery following, today 10/26/21 he is POD1 s/p Repair L hip fracture (intramedullary nailing of L femur with cephalomedullary device) morphine IV, robaxin and will start acetaminophen, consider oxycodone if pain not well controlled  Postoperative atrial fibrillation w/ RVR and associated hypotension requiring vasopressor support in ICU Patient not on long-term anticoagulation therapy but has a Watchman device in place Metoprolol was held for surgery  Likely hypovolemia  500 mL IV fluids given (caution w/ Hx HFpEF) Requiring phenylephrine gtt  Holding other cardiac and anti-hypertensive meds d/t hypotension Telemetry  Diastolic CHF, chronic (HCC) Patient has a history of chronic diastolic dysfunction CHF with last known 2D echocardiogram showed an LVEF of 50% from 2022 Chest x-ray in ED showed findings suggestive of pulmonary vascular congestion, received 1 dose of Lasix 20 mg IV Restart lisinopril and metoprolol when BP allows  Depression Continue sertraline  COPD (chronic obstructive pulmonary disease) (HCC) Stable and not acutely exacerbated See CT chest results - likely needing f/u outpatient  Breathing has been at baseline  Continue as needed bronchodilator therapy as well as inhaled steroids       DVT prophylaxis: SCD Pertinent IV fluids/nutrition: none Central lines / invasive devices: n/a  Code Status: DNR Family Communication: son at bedside on rounds   Disposition: inpatient for now [facility] needs: likely will need rehab SNF  Barriers to discharge / significant pending items: stabilization of Afib, likely needs placement, anticipate d/c in a couple days               Subjective:  Patient reports some heart racing sensation and stable SOB.  Objective:  Vitals:    10/26/21 1000 10/26/21 1200 10/26/21 1300 10/26/21 1400  BP: 100/76 (!) 85/58 112/73 102/69  Pulse: 81 81  99  Resp: (!) 26 (!) 23 (!) 33 (!) 25  Temp:      TempSrc:      SpO2: 98% 98%    Weight:      Height:        Intake/Output Summary (Last 24 hours) at 10/26/2021 1548 Last data filed at 10/26/2021 0800 Gross per 24 hour  Intake 2900.92 ml  Output 450 ml  Net 2450.92 ml   Filed Weights   10/25/21 1111 10/25/21 1525 10/25/21 2105  Weight: 102.3 kg 93 kg 97.8 kg    Examination:  Constitutional:  VS as above General Appearance: alert, well-developed, well-nourished, NAD Eyes: Normal lids and conjunctive, non-icteric sclera Ears, Nose, Mouth, Throat: Normal external appearance MMM Neck: No masses, trachea midline Respiratory: Conversational dyspnea No wheeze No rhonchi No rales Cardiovascular: S1/S2 normal Irreg / tachycardic  No murmur No rub/gallop auscultated No JVD No lower extremity edema Gastrointestinal: No tenderness No masses No hernia appreciated Musculoskeletal:  No clubbing/cyanosis of digits Symmetrical movement in all extremities Neurological: No cranial nerve deficit on limited exam Alert Psychiatric: Normal judgment/insight Normal mood and affect       Scheduled Medications:   acetaminophen  1,000 mg Oral Q8H   Chlorhexidine Gluconate Cloth  6 each Topical Q0600   cyanocobalamin  1,000 mcg Per Tube Daily   docusate sodium  100 mg Oral BID   donepezil  5 mg Per Tube QHS   enoxaparin (LOVENOX) injection  40 mg Subcutaneous Q24H   feeding supplement  237 mL Oral TID BM   ferrous sulfate  325 mg Oral Daily   ketorolac  7.5 mg Intravenous Q6H   latanoprost  1 drop Both Eyes QHS   metoprolol tartrate  2.5 mg Intravenous Q6H   [START ON 10/27/2021] multivitamin with minerals  1 tablet Oral Daily   pantoprazole (PROTONIX) IV  40 mg Intravenous Q24H   rosuvastatin  10 mg Oral Daily   senna  1 tablet Oral BID   sertraline  25 mg Oral  Daily   umeclidinium-vilanterol  1 puff Inhalation Daily    Continuous Infusions:  sodium chloride 75 mL/hr at 10/26/21 1149   sodium chloride      ceFAZolin (ANCEF) IV 2 g (10/26/21 1222)   methocarbamol (ROBAXIN) IV     phenylephrine (NEO-SYNEPHRINE) Adult infusion 20 mcg/min (10/26/21 1151)   [START ON 10/27/2021] potassium chloride      PRN Medications:  bisacodyl, HYDROmorphone (DILAUDID) injection, ketoconazole, methocarbamol **OR** methocarbamol (ROBAXIN) IV, metoCLOPramide **OR** metoCLOPramide (REGLAN) injection, nitroGLYCERIN, ondansetron **OR** ondansetron (ZOFRAN) IV, oxyCODONE, oxyCODONE, senna-docusate, sodium phosphate, traMADol  Antimicrobials:  Anti-infectives (From admission, onward)    Start     Dose/Rate Route Frequency Ordered Stop   10/26/21 1106  ceFAZolin (ANCEF) IVPB 2g/100 mL premix        2 g 200 mL/hr over 30 Minutes Intravenous Every 6 hours 10/26/21 1106 10/27/21 0559   10/25/21 1516  ceFAZolin (ANCEF) 2-4 GM/100ML-% IVPB       Note to Pharmacy: Guadalupe Dawn B: cabinet override      10/25/21 1516 10/25/21 1719   10/25/21 1300  ceFAZolin (ANCEF) IVPB 2g/100 mL premix        2 g 200 mL/hr over 30 Minutes Intravenous  Once 10/25/21 1252 10/25/21 1717       Data Reviewed: I have personally reviewed  following labs and imaging studies  CBC: Recent Labs  Lab 10/25/21 1125 10/25/21 2014 10/25/21 2324 10/26/21 0419  WBC 7.3  --   --  10.9*  NEUTROABS 5.1  --   --   --   HGB 13.1 11.1* 9.8* 8.9*  HCT 39.7 33.3* 29.3* 26.1*  MCV 92.1  --   --  91.6  PLT 154  --   --  123456*   Basic Metabolic Panel: Recent Labs  Lab 10/25/21 1125 10/26/21 0419  NA 143 144  K 3.8 4.8  CL 111 117*  CO2 25 23  GLUCOSE 130* 148*  BUN 22 22  CREATININE 1.03 0.97  CALCIUM 8.9 8.1*  MG 2.1 1.7  PHOS  --  3.2   GFR: Estimated Creatinine Clearance: 72.3 mL/min (by C-G formula based on SCr of 0.97 mg/dL). Liver Function Tests: Recent Labs  Lab  10/25/21 2324  ALBUMIN 2.8*   No results for input(s): "LIPASE", "AMYLASE" in the last 168 hours. No results for input(s): "AMMONIA" in the last 168 hours. Coagulation Profile: Recent Labs  Lab 10/25/21 1125  INR 1.0   Cardiac Enzymes: No results for input(s): "CKTOTAL", "CKMB", "CKMBINDEX", "TROPONINI" in the last 168 hours. BNP (last 3 results) No results for input(s): "PROBNP" in the last 8760 hours. HbA1C: No results for input(s): "HGBA1C" in the last 72 hours. CBG: Recent Labs  Lab 10/25/21 1906 10/25/21 2104  GLUCAP 125* 155*   Lipid Profile: No results for input(s): "CHOL", "HDL", "LDLCALC", "TRIG", "CHOLHDL", "LDLDIRECT" in the last 72 hours. Thyroid Function Tests: No results for input(s): "TSH", "T4TOTAL", "FREET4", "T3FREE", "THYROIDAB" in the last 72 hours. Anemia Panel: No results for input(s): "VITAMINB12", "FOLATE", "FERRITIN", "TIBC", "IRON", "RETICCTPCT" in the last 72 hours. Urine analysis:    Component Value Date/Time   COLORURINE YELLOW (A) 03/31/2018 1203   APPEARANCEUR CLEAR (A) 03/31/2018 1203   APPEARANCEUR Clear 04/02/2013 1415   LABSPEC 1.020 03/31/2018 1203   LABSPEC 1.015 04/02/2013 1415   PHURINE 5.0 03/31/2018 1203   GLUCOSEU NEGATIVE 03/31/2018 1203   GLUCOSEU Negative 04/02/2013 1415   HGBUR MODERATE (A) 03/31/2018 1203   BILIRUBINUR NEGATIVE 03/31/2018 1203   BILIRUBINUR Negative 04/02/2013 1415   KETONESUR NEGATIVE 03/31/2018 1203   PROTEINUR NEGATIVE 03/31/2018 1203   NITRITE NEGATIVE 03/31/2018 1203   LEUKOCYTESUR TRACE (A) 03/31/2018 1203   LEUKOCYTESUR Negative 04/02/2013 1415   Sepsis Labs: @LABRCNTIP (procalcitonin:4,lacticidven:4)  Recent Results (from the past 240 hour(s))  MRSA Next Gen by PCR, Nasal     Status: None   Collection Time: 10/25/21  9:07 PM   Specimen: Nasal Mucosa; Nasal Swab  Result Value Ref Range Status   MRSA by PCR Next Gen NOT DETECTED NOT DETECTED Final    Comment: (NOTE) The GeneXpert MRSA  Assay (FDA approved for NASAL specimens only), is one component of a comprehensive MRSA colonization surveillance program. It is not intended to diagnose MRSA infection nor to guide or monitor treatment for MRSA infections. Test performance is not FDA approved in patients less than 21 years old. Performed at [facility], [address] [place], [place] [address]          Radiology Studies: DG HIP UNILAT WITH PELVIS 2-3 VIEWS LEFT  Result Date: 10/25/2021 CLINICAL DATA:  ORIF LEFT femur EXAM: DG HIP (WITH OR WITHOUT PELVIS) 2-3V LEFT COMPARISON:  10/25/2021 Fluoroscopy time: 3 minutes 47 seconds Dose: Not provided; please refer to operative records Images: 6 FINDINGS: Osseous demineralization. IM nail with 2 compression  screws placed across an intertrochanteric fracture LEFT femur. Significant improvement in fracture fragment alignment since previous exam. Distal locking screw present. IMPRESSION: Post nailing of intertrochanteric fracture LEFT femur. Electronically Signed   By: Lavonia Dana M.D.   On: 10/25/2021 18:42   DG C-Arm 1-60 Min-No Report  Result Date: 10/25/2021 Fluoroscopy was utilized by the requesting physician.  No radiographic interpretation.   CT CHEST WO CONTRAST  Result Date: 10/25/2021 CLINICAL DATA:  Fall.  Pleural effusion. EXAM: CT CHEST WITHOUT CONTRAST TECHNIQUE: Multidetector CT imaging of the chest was performed following the standard protocol without IV contrast. RADIATION DOSE REDUCTION: This exam was performed according to the departmental dose-optimization program which includes automated exposure control, adjustment of the mA and/or kV according to patient size and/or use of iterative reconstruction technique. COMPARISON:  Radiograph of same day.  CT scan of Jul 04, 2017. FINDINGS: Cardiovascular: Atherosclerosis of thoracic aorta is noted without aneurysm formation. Aortic valves calcifications are noted. Normal cardiac size. No pericardial effusion.  Mild coronary artery calcifications are noted. Mediastinum/Nodes: No enlarged mediastinal or axillary lymph nodes. Thyroid gland, trachea, and esophagus demonstrate no significant findings. Lungs/Pleura: No pneumothorax or pleural effusion is noted. Reticular densities are noted peripherally in both lungs suggesting chronic interstitial lung disease. Interval development of 6 mm nodule seen in left lower lobe best seen on image number 121 of series 3. Multiple patchy airspace opacities are noted in the right lung which may represent multifocal inflammation, or potentially severe fibrosis secondary to chronic scarring or inflammation. Some degree of mosaic pattern is seen in the left lung suggesting multifocal inflammation or air trapping secondary to small airways disease. Some degree of bronchiectasis is noted in both lower lobes. Upper Abdomen: No acute abnormality. Musculoskeletal: No chest wall mass or suspicious bone lesions identified. IMPRESSION: There is interval development of multiple patchy airspace opacities in the right lung which may represent multifocal inflammation or pneumonia, or potentially severe masslike fibrosis secondary to chronic scarring or inflammation. Some degree of mosaic pattern is also noted in the left lung suggesting multifocal inflammation or air trapping secondary to small airways disease. There are again noted reticular densities noted peripherally throughout both lungs suggesting chronic interstitial lung disease, as well as some degree of bronchiectasis seen in both lower lobes, most consistent with chronic findings. Interval development of small nodule seen in left lower lobe. Non-contrast chest CT at 6-12 months is recommended. If the nodule is stable at time of repeat CT, then future CT at 18-24 months (from today's scan) is considered optional for low-risk patients, but is recommended for high-risk patients. This recommendation follows the consensus statement: Guidelines  for Management of Incidental Pulmonary Nodules Detected on CT Images: From the Fleischner Society 2017; Radiology 2017; 284:228-243. Mild coronary artery calcifications are noted. Aortic valve calcifications are noted. Aortic Atherosclerosis (ICD10-I70.0). Electronically Signed   By: Marijo Conception M.D.   On: 10/25/2021 14:56   DG Chest 1 View  Result Date: 10/25/2021 CLINICAL DATA:  Fall EXAM: CHEST  1 VIEW COMPARISON:  Radiograph 05/16/2019 FINDINGS: Enlarged cardiac silhouette. Appendage occlusion device noted. There is pulmonary vascular congestion with interstitial prominent bilaterally, left greater than right. No pleural effusion. No pneumothorax. No acute osseous abnormality identified on frontal radiographs of the chest. IMPRESSION: Cardiomegaly with pulmonary vascular congestion and interstitial prominence bilaterally, left greater than right, which could reflect a degree of interstitial edema. Electronically Signed   By: Maurine Simmering M.D.   On: 10/25/2021 11:57   DG  Hip Unilat With Pelvis 2-3 Views Left  Result Date: 10/25/2021 CLINICAL DATA:  Fall, pain EXAM: DG HIP (WITH OR WITHOUT PELVIS) 2-3V LEFT COMPARISON:  None Available. FINDINGS: Osteopenia. Comminuted, displaced and impacted intratrochanteric fractures of the left hip. Status post right hip total arthroplasty. No displaced fracture or dislocation of the bony pelvis. Nonobstructive pattern of overlying bowel gas. IMPRESSION: 1. Comminuted, displaced and impacted intratrochanteric fractures of the left hip. 2. Status post right hip total arthroplasty. 3. No displaced fracture or dislocation of the bony pelvis. Electronically Signed   By: Delanna Ahmadi M.D.   On: 10/25/2021 11:52            LOS: 1 day       Emeterio Reeve, DO [facility] 10/26/2021, 3:48 PM   Staff may message me via secure chat in [place]  but this may not receive immediate response,  please page for urgent matters!  If 7PM-7AM, please contact  night-coverage [email or web address]  Dictation software was used to generate the above note. Typos may occur and escape review, as with typed/written notes. Please contact Dr Sheppard Coil directly for clarity if needed.

## 2021-10-26 NOTE — Progress Notes (Signed)
Initial Nutrition Assessment  DOCUMENTATION CODES:   Non-severe (moderate) malnutrition in context of chronic illness  INTERVENTION:   Ensure Enlive po TID, each supplement provides 350 kcal and 20 grams of protein.  MVI po daily   Pt at high refeed risk; recommend monitor potassium, magnesium and phosphorus labs daily until stable  NUTRITION DIAGNOSIS:   Moderate Malnutrition related to chronic illness (COPD, CHF, advanced age) as evidenced by moderate fat depletion, moderate muscle depletion.  GOAL:   Patient will meet greater than or equal to 90% of their needs  MONITOR:   PO intake, Supplement acceptance, Labs, Weight trends, Skin, I & O's  REASON FOR ASSESSMENT:   Consult Hip fracture protocol  ASSESSMENT:   81 y/o male with h/o Afib, COPD, CHF, depression, BPH s/p TURP, HTN, CAD and HLD who is admitted with hip fracture after fall now s/p repair 9/5 complicated by Afib.  Met with pt and pt's caregiver in room today. Caregiver reports pt with good appetite and oral intake at baseline but reports that he eats small meals at baseline. Pt does drink Ensure at home and reports he loves it. Pt seen by SLP today and initiated on a mechanical soft. RD will add supplements and MVI to help pt meet his estimated needs. Pt is likely at refeed risk. RD discussed with pt the importance of adequate nutrition needed to support post op healing. Per chart, pt is down 10lbs(4%) since June; RD unsure how recently weight loss occurred.   Medications reviewed and include: B12, colace, lovenox, ferrous sulfate, protonix, senokot, NaCl @75ml /hr, KCl  Labs reviewed: K 4.8 wnl, P 3.2 wnl, Mg 1.7 wnl Wbc- 10.9(L), Hgb 8.9(L), Hct 26.1(L) Cbgs- 155, 125 x 24 hrs  NUTRITION - FOCUSED PHYSICAL EXAM:  Flowsheet Row Most Recent Value  Orbital Region Mild depletion  Upper Arm Region Mild depletion  Thoracic and Lumbar Region Mild depletion  Buccal Region Mild depletion  Temple Region Mild  depletion  Clavicle Bone Region Moderate depletion  Clavicle and Acromion Bone Region Moderate depletion  Scapular Bone Region Mild depletion  Dorsal Hand Mild depletion  Patellar Region Mild depletion  Anterior Thigh Region No depletion  Posterior Calf Region No depletion  Edema (RD Assessment) None  Hair Reviewed  Eyes Reviewed  Mouth Reviewed  Skin Reviewed  Nails Reviewed   Diet Order:   Diet Order             DIET DYS 3 Room service appropriate? Yes; Fluid consistency: Thin  Diet effective now                  EDUCATION NEEDS:   Education needs have been addressed  Skin:  Skin Assessment: Reviewed RN Assessment (incision L thigh)  Last BM:  pta  Height:   Ht Readings from Last 1 Encounters:  10/25/21 6' 1"  (1.854 m)    Weight:   Wt Readings from Last 1 Encounters:  10/25/21 97.8 kg    Ideal Body Weight:  83.6 kg  BMI:  Body mass index is 28.45 kg/m.  Estimated Nutritional Needs:   Kcal:  2100-2400kcal/day  Protein:  105-120g/day  Fluid:  2.1-2.4L/day  Koleen Distance MS, RD, LDN Please refer to [facility] for RD and/or RD on-call/weekend/after hours pager

## 2021-10-26 NOTE — Anesthesia Postprocedure Evaluation (Signed)
Anesthesia Post Note  Patient: Jeremy Herrera  Procedure(s) Performed: INTRAMEDULLARY (IM) NAIL INTERTROCHANTERIC (Left: Leg Upper)  Patient location during evaluation: ICU Anesthesia Type: Spinal Level of consciousness: awake Pain management: pain level controlled Vital Signs Assessment: post-procedure vital signs reviewed and stable Respiratory status: spontaneous breathing, respiratory function stable and patient connected to nasal cannula oxygen Cardiovascular status: stable Postop Assessment: no apparent nausea or vomiting Anesthetic complications: no   No notable events documented.   Last Vitals:  Vitals:   10/26/21 0645 10/26/21 0800  BP: 95/63 (!) 115/96  Pulse: 99 (!) 140  Resp: (!) 28 (!) 26  Temp:  36.7 C  SpO2:      Last Pain:  Vitals:   10/26/21 0800  TempSrc: Oral  PainSc: 5                  Keaton Beichner,  Alessandra Bevels

## 2021-10-26 NOTE — Progress Notes (Signed)
NAME:  WYNTON HUFSTETLER, MRN:  [id number], DOB:  1940/10/20, LOS: 1 ADMISSION DATE:  10/25/2021  History of Present Illness:  81 yo M presenting to [facility] ED from home via EMS for evaluation of left hip pain post mechanical fall. Patient reports he uses a roller walker to get around and was trying to get milk out of his refrigerator when he lost his balance and fell on his LEFT side. He was unable to get up and used his life alert to contact EMS. Pain at that time was rated as 8/10 and he sustained an abrasion to his Left elbow & forearm. EMS administer 50 mcg of fentanyl en route. The patient admits to a previous fall, but in 2008- nothing else recent. He denies blurred vision, dizziness or feeling light-headed. He denies feeling ill: no nausea/ vomiting/ diarrhea, no poor po intake, no fever/chills, no cough, no weight gain or swelling, no headache, no urinary symptoms. He does admit to chronic intermittent palpitations, chest pressure and weakness. Patient denies any tobacco use history or ETOH use.   ED course: Upon arrival imaging consistent with LEFT hip fracture. Orthopedics consulted and took patient urgently to OR for left hip cephalomedullary nailing. TRH consulted for admission. Medications given: Cefazolin, Toradol, morphine, TXA administration Initial Vitals: 97.6, 20, 95, 105/69 & 96% on RA  Significant labs: (Labs/ Imaging personally reviewed)  EKG Interpretation: Date: 10/25/21, EKG Time: 20:38, Rate: 143, Rhythm: Afib RVR, QRS Axis:  normal, Intervals: a-fib, ST/T Wave abnormalities: none, Narrative Interpretation: A-fib RVR Chemistry: Na+: 143, K+: 3.8, BUN/Cr.: 22/1.03, Serum CO2/ AG: 25/7, Mg: 2.1 Hematology: WBC: 7.3, Hgb: 13.1 > 11.1,    CXR 10/25/21: cardiomegaly with mild interstitial prominence bilaterally X-ray unilateral LEFT hip 10/25/21: Comminuted, displaced and impacted intratrochanteric fractures of the left hip. Status post right hip total arthroplasty. No displaced  fracture or dislocation of the bony pelvis CT chest wo contrast 10/25/21: There is interval development of multiple patchy airspace opacities in the right lung which may represent multifocal inflammation or pneumonia, or potentially severe masslike fibrosis secondary to chronic scarring or inflammation. Some degree of mosaic pattern is also noted in the left lung suggesting multifocal inflammation or air trapping secondary to small airways disease. There are again noted reticular densities noted peripherally throughout both lungs suggesting chronic interstitial lung disease, as well as some degree of bronchiectasis seen in both lower lobes, most consistent with chronic findings. Interval development of small nodule seen in left lower lobe.   PCCM consulted for assistance in management as the patient is in A-fib RVR requiring vasopressor support post-operatively.  Pertinent  Medical History  HFpEF HTN Dyslipidemia A-fib BPH  Significant Hospital Events: Including procedures, antibiotic start and stop dates in addition to other pertinent events   10/25/21: Admit to ICU with LEFT hip fracture, post-operatively after repair in A-fib RVR requiring peripheral vasopressor support  Interim History / Subjective:  No chest pain, no shortness of breath, no chest tightness today. Reports left hip tenderness today.   Objective   Blood pressure (!) 115/96, pulse (!) 140, temperature 98.1 F (36.7 C), temperature source Oral, resp. rate (!) 26, height 6' 1"  (1.854 m), weight 97.8 kg, SpO2 98 %.        Intake/Output Summary (Last 24 hours) at 10/26/2021 0830 Last data filed at 10/26/2021 0800 Gross per 24 hour  Intake 2900.92 ml  Output 450 ml  Net 2450.92 ml   Filed Weights   10/25/21 1111 10/25/21 1525 10/25/21 2105  Weight: 102.3 kg 93 kg 97.8 kg    Examination:  Physical Exam Vitals reviewed.  Constitutional:      Appearance: He is ill-appearing.  HENT:     Head: Normocephalic.     Nose:  Nose normal.     Mouth/Throat:     Mouth: Mucous membranes are moist.  Eyes:     Pupils: Pupils are equal, round, and reactive to light.  Cardiovascular:     Rate and Rhythm: Normal rate and regular rhythm.     Heart sounds: Normal heart sounds.  Pulmonary:     Effort: Pulmonary effort is normal.     Breath sounds: No rhonchi or rales.  Abdominal:     General: There is distension.     Palpations: Abdomen is soft.  Musculoskeletal:        General: Normal range of motion.     Cervical back: Normal range of motion.  Skin:    General: Skin is warm.  Neurological:     General: No focal deficit present.     Mental Status: He is alert.     Resolved Hospital Problem list    Assessment & Plan:   Afib with RVR Post-operative Hypotension   PMHx: HTN, HFpEF, dyslipidemia Metoprolol held due to surgical intervention. Not on systemic anticoagulation, watchman device in place.  RVR post operatively in the setting of hypovolemia and sedation associated hypotension. He is known to have chronic afib and is not on [facility] with a watchman device in place. Had been on metoprolol which was held post operatively. Will restart today.  - 500 mL bolus of LR started with improvement in HR & BP. - continue phenylephrine drip, titrate to MAP > 65 - hold cardiac & anti-hypertensive medications due to hypotension, consider restarting as patient stabilizes: lasix, lisinopril - Continuous cardiac monitoring   LEFT hip fracture secondary to mechanical fall s/p LEFT hip cephalomedullary nailing  - agree with morphine IV, robaxin and will start acetaminophen, consider oxycodone if pain not well controlled - ortho following, appreciate input - rest of pain management per primary team   COPD without exacerbation - Duonebs every 6 hours standing   Depression - sertraline continued   Labs   CBC: Recent Labs  Lab 10/25/21 1125 10/25/21 2014 10/25/21 2324 10/26/21 0419  WBC 7.3  --   --  10.9*   NEUTROABS 5.1  --   --   --   HGB 13.1 11.1* 9.8* 8.9*  HCT 39.7 33.3* 29.3* 26.1*  MCV 92.1  --   --  91.6  PLT 154  --   --  148*    Basic Metabolic Panel: Recent Labs  Lab 10/25/21 1125 10/26/21 0419  NA 143 144  K 3.8 4.8  CL 111 117*  CO2 25 23  GLUCOSE 130* 148*  BUN 22 22  CREATININE 1.03 0.97  CALCIUM 8.9 8.1*  MG 2.1 1.7  PHOS  --  3.2   GFR: Estimated Creatinine Clearance: 72.3 mL/min (by C-G formula based on SCr of 0.97 mg/dL). Recent Labs  Lab 10/25/21 1125 10/25/21 2339 10/26/21 0419  WBC 7.3  --  10.9*  LATICACIDVEN  --  2.3* 1.4    Liver Function Tests: Recent Labs  Lab 10/25/21 2324  ALBUMIN 2.8*   No results for input(s): "LIPASE", "AMYLASE" in the last 168 hours. No results for input(s): "AMMONIA" in the last 168 hours.  ABG No results found for: "PHART", "PCO2ART", "PO2ART", "HCO3", "TCO2", "ACIDBASEDEF", "O2SAT"   Coagulation  Profile: Recent Labs  Lab 10/25/21 1125  INR 1.0    Cardiac Enzymes: No results for input(s): "CKTOTAL", "CKMB", "CKMBINDEX", "TROPONINI" in the last 168 hours.  HbA1C: Hgb A1c MFr Bld  Date/Time Value Ref Range Status  11/14/2016 05:25 AM 5.3 4.8 - 5.6 % Final    Comment:    (NOTE) Pre diabetes:          5.7%-6.4% Diabetes:              >6.4% Glycemic control for   <7.0% adults with diabetes     CBG: Recent Labs  Lab 10/25/21 1906 10/25/21 2104  GLUCAP 125* 155*      Past Medical History:  He,  has a past medical history of Anemia, BPH (benign prostatic hyperplasia), CHF (congestive heart failure) ([place]), Diabetes mellitus without complication ([place]), Dysphagia, GERD (gastroesophageal reflux disease), HLD (hyperlipidemia), HTN (hypertension), Irregular heart beat, Nocturia, and Urinary retention.   Surgical History:   Past Surgical History:  Procedure Laterality Date   AMPUTATION TOE Left 05/04/2017   Procedure: AMPUTATION TOE - third left toe;  Surgeon: Samara Deist, DPM;  Location:  [facility] ORS;  Service: Podiatry;  Laterality: Left;   bladder patching  1964   HIP SURGERY Right 2014   IRRIGATION AND DEBRIDEMENT FOOT Left 11/14/2016   Procedure: IRRIGATION AND DEBRIDEMENT FOOT;  Surgeon: Samara Deist, DPM;  Location: [facility] ORS;  Service: Podiatry;  Laterality: Left;   TRANSURETHRAL RESECTION OF PROSTATE       Social History:   reports that he has never smoked. He has never used smokeless tobacco. He reports that he does not drink alcohol and does not use drugs.   Family History:  His family history includes Depression in his brother; Diabetes in his brother; GER disease in his sister; Heart attack in his father; Ulcers in his mother. There is no history of Kidney disease, Prostate cancer, Kidney cancer, or Bladder Cancer.   Allergies Allergies  Allergen Reactions   Hydrocodone-Acetaminophen Nausea And Vomiting     Home Medications  Prior to Admission medications   Medication Sig Start Date End Date Taking? Authorizing Provider  Acetaminophen 500 MG capsule SMARTSIG:By Mouth 07/29/19   [provider]  albuterol (VENTOLIN HFA) 108 (90 Base) MCG/ACT inhaler Inhale 1-2 puffs into the lungs every 4 (four) hours as needed.  07/07/18   [provider]  aspirin 81 MG tablet Take 81 mg by mouth daily.    [provider]  bimatoprost (LUMIGAN) 0.01 % SOLN 1 drop nightly    [provider]  clopidogrel (PLAVIX) 75 MG tablet Take by mouth. Patient not taking: Reported on 08/19/2020 11/26/18   [provider]  Cyanocobalamin (B-12) 1000 MCG/ML KIT Inject 1 mL as directed every 30 (thirty) days.    [provider]  donepezil (ARICEPT) 5 MG tablet Take 5 mg by mouth at bedtime.  08/22/17   [provider]  dorzolamide-timolol (COSOPT) 22.3-6.8 MG/ML ophthalmic solution Place 1 drop into both eyes 2 (two) times daily.     [provider]  EMOLLIENT EX Apply topically daily.     [provider]  esomeprazole  (NEXIUM) 20 MG capsule Take 20 mg by mouth daily at 12 noon.    [provider]  ferrous sulfate 325 (65 FE) MG tablet Take 325 mg by mouth daily.    [provider]  Fluticasone-Salmeterol (ADVAIR) 250-50 MCG/DOSE AEPB Inhale 1 puff into the lungs as needed.     [provider]  furosemide (LASIX) 40 MG tablet Take 40 mg by mouth daily. 04/15/19   [provider]  ketoconazole (NIZORAL) 2 % cream Apply 1 application topically daily as needed.  08/31/18   [provider]  lisinopril (ZESTRIL) 2.5 MG tablet Take 2.5 mg by mouth daily.  07/07/18   [provider]  metoprolol succinate (TOPROL-XL) 50 MG 24 hr tablet Take 150 mg by mouth daily.  07/07/18   [provider]  nitroGLYCERIN (NITROSTAT) 0.4 MG SL tablet Place 0.4 mg under the tongue every 5 (five) minutes as needed.  06/17/18   [provider]  potassium chloride (K-DUR) 10 MEQ tablet Take 10 mEq by mouth daily.     [provider]  rosuvastatin (CRESTOR) 10 MG tablet Take 1 tablet by mouth daily. 07/27/20 07/27/21  [provider]  sertraline (ZOLOFT) 25 MG tablet Take 25 mg by mouth daily.  08/31/18   [provider]  simethicone (MYLICON) 867 MG chewable tablet Chew 125 mg by mouth every 6 (six) hours as needed.     [provider]  Skin Protectants, Misc. (REMEDY PHYTOPLEX HYDRAGUARD) CREA Apply topically.    [provider]  umeclidinium-vilanterol (ANORO ELLIPTA) 62.5-25 MCG/INH AEPB Inhale 1 puff into the lungs daily as needed. Takes daily 08/05/18   [provider]  vitamin B-12 (CYANOCOBALAMIN) 1000 MCG tablet Take 1 tablet (1,000 mcg total) by mouth daily. 08/19/20   Earlie Server, MD     Critical care time: 30 minutes

## 2021-10-26 NOTE — Evaluation (Signed)
Clinical/Bedside Swallow Evaluation Patient Details  Name: Jeremy Herrera MRN: [id number] Date of Birth: [birth year]  Today's Date: 10/26/2021 Time: SLP Start Time (ACUTE ONLY): 1030 SLP Stop Time (ACUTE ONLY): 1050 SLP Time Calculation (min) (ACUTE ONLY): 20 min  Past Medical History:  Past Medical History:  Diagnosis Date   Anemia    BPH (benign prostatic hyperplasia)    CHF (congestive heart failure) (HCC)    Diabetes mellitus without complication (HCC)    Dysphagia    GERD (gastroesophageal reflux disease)    HLD (hyperlipidemia)    HTN (hypertension)    Irregular heart beat    Nocturia    Urinary retention    Past Surgical History:  Past Surgical History:  Procedure Laterality Date   AMPUTATION TOE Left 05/04/2017   Procedure: AMPUTATION TOE - third left toe;  Surgeon: Gwyneth Revels, DPM;  Location: [facility] ORS;  Service: Podiatry;  Laterality: Left;   bladder patching  1964   HIP SURGERY Right 2014   IRRIGATION AND DEBRIDEMENT FOOT Left 11/14/2016   Procedure: IRRIGATION AND DEBRIDEMENT FOOT;  Surgeon: Gwyneth Revels, DPM;  Location: [facility] ORS;  Service: Podiatry;  Laterality: Left;   TRANSURETHRAL RESECTION OF PROSTATE     HPI:  Jeremy Herrera is a 81 y.o. male with medical history significant for chronic diastolic dysfunction CHF, hypertension, dyslipidemia, A-fib, BPH who presents to the ER via EMS for evaluation of left hip pain following a mechanical fall. RN reporting that pt had significant cough response with swab of thin liquid, leading to swallow evaluation consult. Chest CT 10/25/21: There is interval development of multiple patchy airspace opacities in the right lung which may represent multifocal inflammation or pneumonia, or potentially severe masslike fibrosis secondary to chronic scarring or inflammation. Some degree of mosaic pattern is also noted in the left lung suggesting multifocal inflammation or air trapping secondary to small airways disease. There are  again noted reticular densities noted peripherally throughout both lungs suggesting chronic interstitial lung disease as well as some degree of bronchiectasis seen in both lower lobes most consistent with chronic findings. Interval development of small nodule seen in left lower lobe. CX Chest: Cardiomegaly with pulmonary vascular congestion and interstitial prominence bilaterally, left greater than right, which could reflect a degree of interstitial edema. Pt is currently on 2L nasal canula and NPO.    Assessment / Plan / Recommendation  Clinical Impression  Pt presents with suspected oropharyngeal dysphagia based on results of today's clinical swallow assessment and previous instrumental assessment. Last MBSS on record was completed on 03/16/2015, revealing "moderate oropharyngeal dysphagia characterized by pre-swallow loss of solid and liquid, delayed pharyngeal swallow initiation, decreased tongue base retraction, and decreased pharyngeal pressure generation." Pt reporting strict application of aspiration precautions in the interim since MBSS and demonstrated amazing recall for results of assessment and recommendations. Pt denied recent PNA or changes in respiratory ability prior to fall.       Clinical swallow assessment completed with trials of soft solids (per pt request), puree, thin liquids (cup and straw), and nectar thick liquids. Pt with chronic dry cough/throat clear that persisted across trials and in the absence of PO; therefore it is not a reliable indicator for aspiration. O2 saturations were maintained at 100% and voice dry/clear t/o assessment. Oral phase notable for min oral residue and prolonged mastication with soft solids. Reduced dentition impacting mastication. Recommend initiation of thin liquids and Dys 3 solids. Education shared for application of strict aspiration precautions, including upright positioning, alert  for PO intake, pausing intake with onset of cough, small sips/bites,  and slow pacing. Pt endorsed understanding. SLP to follow up to ensure compliance with recommendations and safety with intake. SLP Visit Diagnosis: Dysphagia, oropharyngeal phase (R13.12)    Aspiration Risk  Mild aspiration risk    Diet Recommendation  THIN liquids and Dys 3 solids   Medication Administration: Whole meds with liquid    Other  Recommendations Oral Care Recommendations: Oral care BID    Recommendations for follow up therapy are one component of a multi-disciplinary discharge planning process, led by the attending physician.  Recommendations may be updated based on patient status, additional functional criteria and insurance authorization.  Follow up Recommendations Skilled nursing-short term rehab (<3 hours/day)      Assistance Recommended at Discharge Set up Supervision/Assistance  Functional Status Assessment Patient has had a recent decline in their functional status and demonstrates the ability to make significant improvements in function in a reasonable and predictable amount of time.  Frequency and Duration min 2x/week  1 week       Prognosis Prognosis for Safe Diet Advancement: Good Barriers to Reach Goals:  (none)      Swallow Study   General Date of Onset: 10/26/21 HPI: Jeremy Herrera is a 81 y.o. male with medical history significant for chronic diastolic dysfunction CHF, hypertension, dyslipidemia, A-fib, BPH who presents to the ER via EMS for evaluation of left hip pain following a mechanical fall. RN reporting that pt had significant cough response with swab of thin liquid, leading to swallow evaluation consult. Chest CT 10/25/21: There is interval development of multiple patchy airspace opacities in the right lung which may represent multifocal inflammation or pneumonia, or potentially severe masslike fibrosis secondary to chronic scarring or inflammation. Some degree of mosaic pattern is also noted in the left lung suggesting multifocal inflammation or  air trapping secondary to small airways disease. There are again noted reticular densities noted peripherally throughout both lungs suggesting chronic interstitial lung disease as well as some degree of bronchiectasis seen in both lower lobes most consistent with chronic findings. Interval development of small nodule seen in left lower lobe. CX Chest: Cardiomegaly with pulmonary vascular congestion and interstitial prominence bilaterally, left greater than right, which could reflect a degree of interstitial edema. Pt is currently on 2L nasal canula and NPO. Type of Study: Bedside Swallow Evaluation Previous Swallow Assessment: Last MBSS on record was completed on 03/16/2015, revealing "moderate oropharyngeal dysphagia characterized by pre-swallow loss of solid and liquid, delayed pharyngeal swallow initiation, decreased tongue base retraction, and decreased pharyngeal pressure generation." Clinical swallow assessment completed on 11/15/2016 with recommendations for Dys 3 solids and thin liquids. Diet Prior to this Study: NPO Temperature Spikes Noted: No (Temp 98.1 (WBC trending up at 10.9)) Respiratory Status: Nasal cannula History of Recent Intubation: No Behavior/Cognition: Alert;Cooperative;Pleasant mood Oral Cavity Assessment: Dry Oral Care Completed by SLP: Yes Oral Cavity - Dentition: Poor condition;Missing dentition (edentulous top; missing some on bottom) Vision: Functional for self-feeding Self-Feeding Abilities: Needs assist;Needs set up Patient Positioning: Upright in bed Baseline Vocal Quality: Hoarse Volitional Cough: Strong Volitional Swallow: Able to elicit    Oral/Motor/Sensory Function Overall Oral Motor/Sensory Function: Within functional limits   Ice Chips Ice chips: Within functional limits   Thin Liquid Thin Liquid: Impaired Presentation: Cup;Straw Pharyngeal  Phase Impairments: Throat Clearing - Immediate;Throat Clearing - Delayed;Cough - Delayed Other Comments:  cough/throat clear chronic and demonstrated t/o assessment    Nectar Thick Nectar Thick Liquid: Impaired  Presentation: Straw Pharyngeal Phase Impairments: Throat Clearing - Delayed;Cough - Delayed Other Comments: cough/throat clear chronic and demonstrated t/o assessment   Honey Thick Honey Thick Liquid: Not tested   Puree Puree: Impaired Presentation: Spoon Pharyngeal Phase Impairments: Throat Clearing - Delayed (cough/throat clear chronic and demonstrated t/o assessment) Other Comments: cough/throat clear chronic and demonstrated t/o assessment   Solid     Solid: Impaired Presentation: Spoon Oral Phase Impairments: Impaired mastication Pharyngeal Phase Impairments: Throat Clearing - Delayed Other Comments: soft solids- cough/throat clear chronic and demonstrated t/o assessment     [place] Rainelle Sulewski Clapp MS CCC-SLP  [place] J Clapp 10/26/2021,11:51 AM

## 2021-10-26 NOTE — Evaluation (Signed)
Occupational Therapy Evaluation Patient Details Name: Jeremy Herrera MRN: [id number] DOB: [birth year] Today's Date: 10/26/2021   History of Present Illness Pt admitted for L femur fx s/p fall and is POD 1 for L hip nailing.   Clinical Impression   Jeremy Herrera was seen for OT evaluation this date. Prior to hospital admission, pt was MOD I for mobility and ADLs. Pt lives with children. Pt presents to acute OT demonstrating impaired ADL performance and functional mobility 2/2 decreased activity tolerance and functional strength/ROM/balance deficits. Pt currently requires MOD A x2 sup>sit, initial dizziness however improved sitting tolerance with time. MAX A x2 + RW sit<>stand, unable to achieve upright posture, pt reports dizziness and returned to supine. Pt would benefit from skilled OT to address noted impairments and functional limitations (see below for any additional details). Upon hospital discharge, recommend STR to maximize pt safety and return to PLOF.   SUPINE: BP 86/73, MAP 80 SEATED: BP 78/62, MAP 68 SEATED 3': BP 94/76, MAP 82 STANDING: BP 70/56, MAP 62.    Recommendations for follow up therapy are one component of a multi-disciplinary discharge planning process, led by the attending physician.  Recommendations may be updated based on patient status, additional functional criteria and insurance authorization.   Follow Up Recommendations  Skilled nursing-short term rehab (<3 hours/day)    Assistance Recommended at Discharge Frequent or constant Supervision/Assistance  Patient can return home with the following Two people to help with walking and/or transfers;Two people to help with bathing/dressing/bathroom    Functional Status Assessment  Patient has had a recent decline in their functional status and demonstrates the ability to make significant improvements in function in a reasonable and predictable amount of time.  Equipment Recommendations  Other (comment) (defer)     Recommendations for Other Services       Precautions / Restrictions Precautions Precautions: Fall Restrictions Weight Bearing Restrictions: Yes LLE Weight Bearing: Weight bearing as tolerated      Mobility Bed Mobility Overal bed mobility: Needs Assistance Bed Mobility: Supine to Sit, Sit to Supine     Supine to sit: Mod assist, +2 for physical assistance Sit to supine: Max assist, +2 for physical assistance        Transfers Overall transfer level: Needs assistance Equipment used: Rolling walker (2 wheels) Transfers: Sit to/from Stand Sit to Stand: Max assist, +2 physical assistance           General transfer comment: unable to schieve fully upright posture      Balance Overall balance assessment: Needs assistance Sitting-balance support: Bilateral upper extremity supported, Feet supported Sitting balance-Leahy Scale: Fair     Standing balance support: Bilateral upper extremity supported Standing balance-Leahy Scale: Zero                             ADL either performed or assessed with clinical judgement   ADL Overall ADL's : Needs assistance/impaired                                       General ADL Comments: MAX A don B socks seated EOB.      Pertinent Vitals/Pain Pain Assessment Pain Assessment: Faces Faces Pain Scale: Hurts even more Pain Location: LLE Pain Descriptors / Indicators: Discomfort, Dull, Grimacing, Operative site guarding Pain Intervention(s): Limited activity within patient's tolerance, Repositioned, RN gave pain meds during session  Hand Dominance     Extremity/Trunk Assessment Upper Extremity Assessment Upper Extremity Assessment: Generalized weakness   Lower Extremity Assessment Lower Extremity Assessment: Generalized weakness       Communication Communication Communication: HOH   Cognition Arousal/Alertness: Awake/alert Behavior During Therapy: WFL for tasks  assessed/performed Overall Cognitive Status: Impaired/Different from baseline Area of Impairment: Safety/judgement                         Safety/Judgement: Decreased awareness of deficits           General Comments  RN notified L elbow skin tear/ SUPINE: BP 86/73, MAP 80. SEATED: BP 78/62, MAP 68. SEATED 3': BP 94/76, MAP 82. STANDING: BP 70/56, MAP 62            Home Living Family/patient expects to be discharged to:: Private residence Living Arrangements: Children (daughter, son in lalw) Available Help at Discharge: Family;Available PRN/intermittently Type of Home: House Home Access: Stairs to enter Entrance Stairs-Number of Steps: 1   Home Layout: One level               Home Equipment: Agricultural consultant (2 wheels);Rollator (4 wheels);Cane - single point          Prior Functioning/Environment Prior Level of Function : Independent/Modified Independent             Mobility Comments: multiple falls          OT Problem List: Decreased strength;Decreased range of motion;Decreased activity tolerance;Impaired balance (sitting and/or standing);Decreased safety awareness      OT Treatment/Interventions: Self-care/ADL training;Therapeutic exercise;Energy conservation;DME and/or AE instruction;Therapeutic activities;Patient/family education;Balance training    OT Goals(Current goals can be found in the care plan section) Acute Rehab OT Goals Patient Stated Goal: to go home OT Goal Formulation: With patient Time For Goal Achievement: 11/09/21 Potential to Achieve Goals: Good ADL Goals Pt Will Perform Grooming: with set-up;with supervision;sitting Pt Will Perform Lower Body Dressing: with min assist;sitting/lateral leans Pt Will Transfer to Toilet: with mod assist;stand pivot transfer  OT Frequency: Min 2X/week    Co-evaluation PT/OT/SLP Co-Evaluation/Treatment: Yes Reason for Co-Treatment: For patient/therapist safety;To address functional/ADL  transfers;Complexity of the patient's impairments (multi-system involvement) PT goals addressed during session: Mobility/safety with mobility OT goals addressed during session: ADL's and self-care      AM-PAC OT "6 Clicks" Daily Activity     Outcome Measure Help from another person eating meals?: None Help from another person taking care of personal grooming?: A Little Help from another person toileting, which includes using toliet, bedpan, or urinal?: A Lot Help from another person bathing (including washing, rinsing, drying)?: A Lot Help from another person to put on and taking off regular upper body clothing?: A Little Help from another person to put on and taking off regular lower body clothing?: A Lot 6 Click Score: 16   End of Session Equipment Utilized During Treatment: Rolling walker (2 wheels);Gait belt Nurse Communication: Mobility status  Activity Tolerance: Patient tolerated treatment well Patient left: in bed;with call bell/phone within reach;with nursing/sitter in room  OT Visit Diagnosis: Other abnormalities of gait and mobility (R26.89);Muscle weakness (generalized) (M62.81);Repeated falls (R29.6)                Time: 7824-2353 OT Time Calculation (min): 26 min Charges:  OT General Charges $OT Visit: 1 Visit OT Evaluation $OT Eval Moderate Complexity: 1 Mod  Kathie Dike, M.S. OTR/L  10/26/21, 4:16 PM  ascom [phone]

## 2021-10-26 NOTE — Hospital Course (Addendum)
Jeremy Herrera is a 81 y.o. male with medical history significant for chronic diastolic dysfunction CHF, hypertension, dyslipidemia, A-fib (BB, Plavix, ASA - NOT on anticoagulation), BPH who presents to the ER from home on 10/25/2021 via EMS for evaluation of left hip pain following a mechanical fall. No syncope. Chronic SOB. Uses roller walker at baseline.  09/05: X-ray of the left hip shows comminuted, displaced and impacted intratrochanteric fractures of the left hip. Status post right hip total arthroplasty. No displaced fracture or dislocation of the bony pelvis. CT chest: interval development of multiple patchy airspace opacities in the right lung which may represent multifocal inflammation or pneumonia, or potentially severe masslike fibrosis secondary to chronic scarring or inflammation. 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 Orthopedic surgery performed intramedullary nailing of L femur with cephalomedullary device. Postoperative Afib RVR requiring vasopressor support, ICU consulted and maintaining phenylephrine gtt.  09/06: tachycardiac and soft BP overnight improving, tachypnea about the same. Hgb slight drop to 8.9 postop. Received albumin overnight. Still on phenylephrine this morning.  09/07: BP improved and HR up, low dose IV lopressor given but BP dropped.  Continues to have significant dyspnea though he states this is about at baseline.  Echocardiogram ordered. Cardiology consulted.   Consultants:  Orthopedics (Dr. Signa Kell)  Procedures: 09/05: Repair L hip fracture (intramedullary nailing of L femur with cephalomedullary device)      ASSESSMENT & PLAN:   Principal Problem:   Closed displaced intertrochanteric fracture of left femur (HCC) Active Problems:   Depression   Diastolic CHF, chronic (HCC)   COPD (chronic obstructive pulmonary disease) (HCC)   Atrial fibrillation, chronic (HCC)   Postoperative atrial fibrillation w/ RVR and associated hypotension  requiring vasopressor support in ICU  Closed displaced intertrochanteric fracture of left femur (HCC) Status post mechanical fall with  closed intertrochanteric fracture of left femur Orthopedic surgery following, today 10/27/21 he is POD2 s/p Repair L hip fracture (intramedullary nailing of L femur with cephalomedullary device) morphine IV, robaxin and will start acetaminophen, consider oxycodone if pain not well controlled  Postoperative atrial fibrillation w/ RVR and associated hypotension requiring vasopressor support in ICU Patient not on long-term anticoagulation therapy but has a Watchman device in place Metoprolol was held for surgery  Likely hypovolemia however does not seem to be improving with fluid resuscitation 500 mL IV fluids given (caution w/ Hx HFpEF) Requiring phenylephrine gtt  Low dose IV beta blocker for RVR, taking care w/ hypotension Holding other cardiac and anti-hypertensive meds d/t hypotension Telemetry Echocardiogram cardiology consult  Diastolic CHF, chronic (HCC) - does not appear to be in acute exacerbation Patient has a history of chronic diastolic dysfunction CHF with last known 2D echocardiogram showed an LVEF of 50% from 2022 Chest x-ray in ED showed findings suggestive of pulmonary vascular congestion, received 1 dose of Lasix 20 mg IV Restart lisinopril when BP allows Echocardiogram cardiology consult  Depression Continue sertraline  COPD (chronic obstructive pulmonary disease) (HCC) Stable and not acutely exacerbated See CT chest results - likely needing f/u outpatient  Breathing has been at baseline  Continue as needed bronchodilator therapy as well as inhaled steroids       DVT prophylaxis: SCD Pertinent IV fluids/nutrition: none Central lines / invasive devices: n/a  Code Status: DNR Family Communication: none at this time  Disposition: inpatient for now [facility] needs: likely will need rehab SNF  Barriers to discharge / significant  pending items: stabilization of Afib, likely needs placement, anticipate d/c in a couple days  if able to come off pressors and BP/HR maintaining

## 2021-10-26 NOTE — Evaluation (Signed)
Physical Therapy Evaluation Patient Details Name: Jeremy Herrera MRN: [id number] DOB: 02/03/1941 Today's Date: 10/26/2021  History of Present Illness  Jeremy Herrera is a 81 y.o. male who had a fall earlier today.  The patient noted immediate hip pain and inability to ambulate.  The patient ambulates with a walker at baseline.  The patient lives at home with his daughter. Pain is worse with any sort of movement.  X-rays in the emergency department show a left intertrochanteric hip fracture. Pt is POD 1 from hip nailing.  Clinical Impression  Pt is a pleasant 81 year old male who was admitted for L femur fx and is s/p nailing of L femur, POD1. Pt performs bed mobility with mod A +2 transfers with max assist +2 and RW. Unable to fully stand with attempt and limited by low BP, pain, and WOB. RR up to 40 and HR up to 150 bpm with exertion. RN in room to assess. Pt demonstrates deficits with strength/mobility/pain. Would benefit from skilled PT to address above deficits and promote optimal return to PLOF; recommend transition to STR upon discharge from acute hospitalization.      Recommendations for follow up therapy are one component of a multi-disciplinary discharge planning process, led by the attending physician.  Recommendations may be updated based on patient status, additional functional criteria and insurance authorization.  Follow Up Recommendations Skilled nursing-short term rehab (<3 hours/day) Can patient physically be transported by private vehicle: No    Assistance Recommended at Discharge Frequent or constant Supervision/Assistance  Patient can return home with the following  Two people to help with walking and/or transfers;Two people to help with bathing/dressing/bathroom    Equipment Recommendations  (TBD)  Recommendations for Other Services       Functional Status Assessment Patient has had a recent decline in their functional status and demonstrates the ability to make  significant improvements in function in a reasonable and predictable amount of time.     Precautions / Restrictions Precautions Precautions: Fall Restrictions Weight Bearing Restrictions: Yes LLE Weight Bearing: Weight bearing as tolerated      Mobility  Bed Mobility Overal bed mobility: Needs Assistance Bed Mobility: Supine to Sit, Sit to Supine     Supine to sit: Mod assist, +2 for physical assistance Sit to supine: Max assist, +2 for physical assistance   General bed mobility comments: follows commands and able to sit at EOB. Does report dizziness with change of position. Low BP noted    Transfers Overall transfer level: Needs assistance Equipment used: Rolling walker (2 wheels) Transfers: Sit to/from Stand Sit to Stand: Max assist, +2 physical assistance           General transfer comment: needs assist for ant translation and unable to get fully upright. Becomes very dizzy and request to return back to bed. Unable to further perform attempts    Ambulation/Gait               General Gait Details: unable at this time  Stairs            Wheelchair Mobility    Modified Rankin (Stroke Patients Only)       Balance Overall balance assessment: History of Falls, Needs assistance Sitting-balance support: Bilateral upper extremity supported, Feet supported Sitting balance-Leahy Scale: Fair     Standing balance support: Bilateral upper extremity supported Standing balance-Leahy Scale: Zero  Pertinent Vitals/Pain Pain Assessment Pain Assessment: 0-10 Pain Score: 2  (appears to increase to 10/10 with mobility) Pain Location: LLE Pain Descriptors / Indicators: Discomfort, Dull, Grimacing, Operative site guarding Pain Intervention(s): Limited activity within patient's tolerance, Repositioned    Home Living Family/patient expects to be discharged to:: Private residence Living Arrangements: Children (daughter  and SIL) Available Help at Discharge: Family;Available PRN/intermittently Type of Home: House Home Access: Stairs to enter Entrance Stairs-Rails: None Entrance Stairs-Number of Steps: 1   Home Layout: One level Home Equipment: Agricultural consultant (2 wheels);Rollator (4 wheels);Cane - single point      Prior Function Prior Level of Function : Independent/Modified Independent             Mobility Comments: multiple falls ADLs Comments: relies on family for transportation. Reports remote falls history     Hand Dominance        Extremity/Trunk Assessment   Upper Extremity Assessment Upper Extremity Assessment: Generalized weakness (B UE grossly 3/5)    Lower Extremity Assessment Lower Extremity Assessment: Generalized weakness (L LE grossly 2/5; R LE grossly 3/5)       Communication   Communication: HOH  Cognition Arousal/Alertness: Awake/alert Behavior During Therapy: WFL for tasks assessed/performed Overall Cognitive Status: Within Functional Limits for tasks assessed                                 General Comments: sleeping upon arrival, however easily wakes up and is good historian        General Comments General comments (skin integrity, edema, etc.): RN notified L elbow skin tear/ SUPINE: BP 86/73, MAP 80. SEATED: BP 78/62, MAP 68. SEATED 3': BP 94/76, MAP 82. STANDING: BP 70/56, MAP 62    Exercises     Assessment/Plan    PT Assessment Patient needs continued PT services  PT Problem List Decreased strength;Decreased mobility;Pain       PT Treatment Interventions DME instruction;Gait training;Therapeutic exercise;Balance training    PT Goals (Current goals can be found in the Care Plan section)  Acute Rehab PT Goals Patient Stated Goal: to go home PT Goal Formulation: With patient Time For Goal Achievement: 11/09/21 Potential to Achieve Goals: Fair    Frequency 7X/week     Co-evaluation PT/OT/SLP Co-Evaluation/Treatment:  Yes Reason for Co-Treatment: For patient/therapist safety;To address functional/ADL transfers;Complexity of the patient's impairments (multi-system involvement) PT goals addressed during session: Mobility/safety with mobility OT goals addressed during session: ADL's and self-care       AM-PAC PT "6 Clicks" Mobility  Outcome Measure Help needed turning from your back to your side while in a flat bed without using bedrails?: A Little Help needed moving from lying on your back to sitting on the side of a flat bed without using bedrails?: A Lot Help needed moving to and from a bed to a chair (including a wheelchair)?: A Lot Help needed standing up from a chair using your arms (e.g., wheelchair or bedside chair)?: A Lot Help needed to walk in hospital room?: Total Help needed climbing 3-5 steps with a railing? : Total 6 Click Score: 11    End of Session Equipment Utilized During Treatment: Gait belt;Oxygen Activity Tolerance: Patient limited by pain Patient left: in bed;with nursing/sitter in room Nurse Communication: Mobility status PT Visit Diagnosis: Muscle weakness (generalized) (M62.81);Difficulty in walking, not elsewhere classified (R26.2);Pain;Unsteadiness on feet (R26.81) Pain - Right/Left: Left Pain - part of body: Hip  Time: 8101-7510 PT Time Calculation (min) (ACUTE ONLY): 26 min   Charges:   PT Evaluation $PT Eval Moderate Complexity: 1 Mod PT Treatments $Therapeutic Activity: 8-22 mins        Elizabeth Palau, PT, DPT, GCS [phone]   Carlen Fils 10/26/2021, 4:50 PM

## 2021-10-26 NOTE — Progress Notes (Signed)
  Subjective: 1 Day Post-Op Procedure(s) (LRB): INTRAMEDULLARY (IM) NAIL INTERTROCHANTERIC (Left) Patient reports pain as moderate.   Patient is well, and has had no acute complaints or problems.  Slept off and on.  In ICU for Afib.   Plan is to go Skilled nursing facility after hospital stay. Negative for chest pain and shortness of breath Fever: no Gastrointestinal:Negative for nausea and vomiting  Objective: Vital signs in last 24 hours: Temp:  [97 F (36.1 C)-98.1 F (36.7 C)] 97.8 F (36.6 C) (09/06 0400) Pulse Rate:  [29-147] 99 (09/06 0645) Resp:  [15-41] 28 (09/06 0645) BP: (62-133)/(36-99) 95/63 (09/06 0645) SpO2:  [91 %-100 %] 98 % (09/06 0400) Weight:  [93 kg-102.3 kg] 97.8 kg (09/05 2105)  Intake/Output from previous day:  Intake/Output Summary (Last 24 hours) at 10/26/2021 0802 Last data filed at 10/26/2021 0400 Gross per 24 hour  Intake 2407.87 ml  Output 450 ml  Net 1957.87 ml    Intake/Output this shift: No intake/output data recorded.  Labs: Recent Labs    10/25/21 1125 10/25/21 2014 10/25/21 2324 10/26/21 0419  HGB 13.1 11.1* 9.8* 8.9*   Recent Labs    10/25/21 1125 10/25/21 2014 10/25/21 2324 10/26/21 0419  WBC 7.3  --   --  10.9*  RBC 4.31  --   --  2.85*  HCT 39.7   < > 29.3* 26.1*  PLT 154  --   --  148*   < > = values in this interval not displayed.   Recent Labs    10/25/21 1125 10/26/21 0419  NA 143 144  K 3.8 4.8  CL 111 117*  CO2 25 23  BUN 22 22  CREATININE 1.03 0.97  GLUCOSE 130* 148*  CALCIUM 8.9 8.1*   Recent Labs    10/25/21 1125  INR 1.0     EXAM General - Patient is Alert and Oriented Extremity - Sensation intact distally Dorsiflexion/Plantar flexion intact Compartment soft Dressing/Incision - clean, dry, scant proximal drainage.   Motor Function - intact, moving foot and toes well on exam.   Past Medical History:  Diagnosis Date   Anemia    BPH (benign prostatic hyperplasia)    CHF (congestive heart  failure) (HCC)    Diabetes mellitus without complication (HCC)    Dysphagia    GERD (gastroesophageal reflux disease)    HLD (hyperlipidemia)    HTN (hypertension)    Irregular heart beat    Nocturia    Urinary retention     Assessment/Plan: 1 Day Post-Op Procedure(s) (LRB): INTRAMEDULLARY (IM) NAIL INTERTROCHANTERIC (Left) Principal Problem:   Closed displaced intertrochanteric fracture of left femur (HCC) Active Problems:   Depression   Diastolic CHF, chronic (HCC)   COPD (chronic obstructive pulmonary disease) (HCC)   Atrial fibrillation, chronic (HCC)   Postoperative atrial fibrillation w/ RVR and associated hypotension requiring vasopressor support in ICU  Estimated body mass index is 28.45 kg/m as calculated from the following:   Height as of this encounter: 6\' 1"  (1.854 m).   Weight as of this encounter: 97.8 kg. Advance diet Up with therapy  Discharge planning with Care Management.  Follow up at [facility] in 2 weeks for xrays and staple removal.    DVT Prophylaxis - Lovenox, Foot Pumps, and TED hose Weight-Bearing as tolerated to Left leg  [facility], PA-C Orthopaedic Surgery 10/26/2021, 8:02 AM

## 2021-10-27 ENCOUNTER — Inpatient Hospital Stay: Payer: Medicare PPO

## 2021-10-27 ENCOUNTER — Inpatient Hospital Stay (HOSPITAL_COMMUNITY)
Admit: 2021-10-27 | Discharge: 2021-10-27 | Disposition: A | Discharge status: Home / Self Care | Payer: Medicare PPO | Attending: Osteopathic Medicine | Admitting: Osteopathic Medicine

## 2021-10-27 DIAGNOSIS — F325 Major depressive disorder, single episode, in full remission: Secondary | ICD-10-CM | POA: Diagnosis not present

## 2021-10-27 DIAGNOSIS — I4891 Unspecified atrial fibrillation: Secondary | ICD-10-CM | POA: Diagnosis not present

## 2021-10-27 DIAGNOSIS — I482 Chronic atrial fibrillation, unspecified: Secondary | ICD-10-CM | POA: Diagnosis not present

## 2021-10-27 DIAGNOSIS — J449 Chronic obstructive pulmonary disease, unspecified: Secondary | ICD-10-CM | POA: Diagnosis not present

## 2021-10-27 DIAGNOSIS — S72142A Displaced intertrochanteric fracture of left femur, initial encounter for closed fracture: Secondary | ICD-10-CM | POA: Diagnosis not present

## 2021-10-27 LAB — HEMOGLOBIN AND HEMATOCRIT, BLOOD
HCT: 24.5 % — ABNORMAL LOW (ref 39.0–52.0)
Hemoglobin: 8.2 g/dL — ABNORMAL LOW (ref 13.0–17.0)

## 2021-10-27 LAB — CBC
HCT: 22.4 % — ABNORMAL LOW (ref 39.0–52.0)
Hemoglobin: 7.5 g/dL — ABNORMAL LOW (ref 13.0–17.0)
MCH: 31 pg (ref 26.0–34.0)
MCHC: 33.5 g/dL (ref 30.0–36.0)
MCV: 92.6 fL (ref 80.0–100.0)
Platelets: 105 10*3/uL — ABNORMAL LOW (ref 150–400)
RBC: 2.42 MIL/uL — ABNORMAL LOW (ref 4.22–5.81)
RDW: 14.6 % (ref 11.5–15.5)
WBC: 6 10*3/uL (ref 4.0–10.5)
nRBC: 0 % (ref 0.0–0.2)

## 2021-10-27 LAB — BASIC METABOLIC PANEL
Anion gap: 4 — ABNORMAL LOW (ref 5–15)
BUN: 19 mg/dL (ref 8–23)
CO2: 22 mmol/L (ref 22–32)
Calcium: 7.9 mg/dL — ABNORMAL LOW (ref 8.9–10.3)
Chloride: 113 mmol/L — ABNORMAL HIGH (ref 98–111)
Creatinine, Ser: 1.12 mg/dL (ref 0.61–1.24)
GFR, Estimated: >60 mL/min (ref >60)
Glucose, Bld: 133 mg/dL — ABNORMAL HIGH (ref 70–99)
Potassium: 4.3 mmol/L (ref 3.5–5.1)
Sodium: 139 mmol/L (ref 135–145)

## 2021-10-27 LAB — ECHOCARDIOGRAM COMPLETE
AR max vel: 1.15 cm2
AV Area VTI: 1.29 cm2
AV Area mean vel: 1.05 cm2
AV Mean grad: 6.3 mmHg
AV Peak grad: 11.4 mmHg
Ao pk vel: 1.69 m/s
Area-P 1/2: 4.63 cm2
Height: 73 in
S' Lateral: 4.15 cm
Weight: 3449.76 oz

## 2021-10-27 LAB — MAGNESIUM: Magnesium: 2 mg/dL (ref 1.7–2.4)

## 2021-10-27 LAB — PREPARE RBC (CROSSMATCH)

## 2021-10-27 MED ORDER — METOPROLOL TARTRATE 25 MG PO TABS
25.0000 mg | ORAL_TABLET | Freq: Two times a day (BID) | ORAL | Status: DC
Start: 2021-10-27 — End: 2021-10-27
  Administered 2021-10-27: 25 mg via ORAL
  Filled 2021-10-27: qty 1

## 2021-10-27 MED ORDER — DILTIAZEM HCL 60 MG PO TABS: 60.0000 mg | ORAL_TABLET | Freq: Four times a day (QID) | ORAL | Status: DC

## 2021-10-27 MED ORDER — METOPROLOL TARTRATE 25 MG PO TABS
12.5000 mg | ORAL_TABLET | Freq: Two times a day (BID) | ORAL | Status: DC
  Administered 2021-10-27 – 2021-10-28 (×2): 12.5 mg via ORAL
  Filled 2021-10-27 (×2): qty 1

## 2021-10-27 MED ORDER — METOPROLOL TARTRATE 5 MG/5ML IV SOLN
2.5000 mg | Freq: Three times a day (TID) | INTRAVENOUS | Status: DC | PRN
  Administered 2021-10-28: 2.5 mg via INTRAVENOUS
  Filled 2021-10-27: qty 5

## 2021-10-27 MED ORDER — SODIUM CHLORIDE 0.9% IV SOLUTION
Freq: Once | INTRAVENOUS | Status: AC
Start: 2021-10-27 — End: 2021-10-27

## 2021-10-27 NOTE — Progress Notes (Addendum)
PROGRESS NOTE    Jeremy Herrera   DZH:[id number] DOB: April 28, 1940  DOA: 10/25/2021 Date of Service: 10/27/21 PCP: Marina Goodell, MD     Brief Narrative / Hospital Course:  Jeremy Herrera is a 81 y.o. male with medical history significant for chronic diastolic dysfunction CHF, hypertension, dyslipidemia, A-fib (BB, Plavix, ASA - NOT on anticoagulation), BPH who presents to the ER from home on 10/25/2021 via EMS for evaluation of left hip pain following a mechanical fall. No syncope. Chronic SOB. Uses roller walker at baseline.  09/05: X-ray of the left hip shows comminuted, displaced and impacted intratrochanteric fractures of the left hip. Status post right hip total arthroplasty. No displaced fracture or dislocation of the bony pelvis. CT chest: interval development of multiple patchy airspace opacities in the right lung which may represent multifocal inflammation or pneumonia, or potentially severe masslike fibrosis secondary to chronic scarring or inflammation. 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 Orthopedic surgery performed intramedullary nailing of L femur with cephalomedullary device. Postoperative Afib RVR requiring vasopressor support, ICU consulted and maintaining phenylephrine gtt.  09/06: tachycardiac and soft BP overnight improving, tachypnea about the same. Hgb slight drop to 8.9 postop. Received albumin overnight. Still on phenylephrine this morning.  09/07: BP improved and HR up, low dose IV lopressor given but BP dropped.  Continues to have significant dyspnea though he states this is about at baseline.  Echocardiogram ordered. Cardiology consulted.   Consultants:  Orthopedics (Dr. Signa Kell)  Procedures: 09/05: Repair L hip fracture (intramedullary nailing of L femur with cephalomedullary device)      ASSESSMENT & PLAN:   Principal Problem:   Closed displaced intertrochanteric fracture of left femur (HCC) Active Problems:   Depression   Diastolic  CHF, chronic (HCC)   COPD (chronic obstructive pulmonary disease) (HCC)   Atrial fibrillation, chronic (HCC)   Postoperative atrial fibrillation w/ RVR and associated hypotension requiring vasopressor support in ICU  Closed displaced intertrochanteric fracture of left femur (HCC) Status post mechanical fall with  closed intertrochanteric fracture of left femur Orthopedic surgery following, today 10/27/21 he is POD2 s/p Repair L hip fracture (intramedullary nailing of L femur with cephalomedullary device) morphine IV, robaxin and will start acetaminophen, consider oxycodone if pain not well controlled  Postoperative atrial fibrillation w/ RVR and associated hypotension requiring vasopressor support in ICU Patient not on long-term anticoagulation therapy but has a Watchman device in place Metoprolol was held for surgery  Likely hypovolemia however does not seem to be improving with fluid resuscitation 500 mL IV fluids given (caution w/ Hx HFpEF) Requiring phenylephrine gtt  Low dose IV beta blocker for RVR, taking care w/ hypotension Holding other cardiac and anti-hypertensive meds d/t hypotension Telemetry Echocardiogram cardiology consult  Diastolic CHF, chronic (HCC) - does not appear to be in acute exacerbation Patient has a history of chronic diastolic dysfunction CHF with last known 2D echocardiogram showed an LVEF of 50% from 2022 Chest x-ray in ED showed findings suggestive of pulmonary vascular congestion, received 1 dose of Lasix 20 mg IV Restart lisinopril when BP allows Echocardiogram cardiology consult  Depression Continue sertraline  COPD (chronic obstructive pulmonary disease) (HCC) Stable and not acutely exacerbated See CT chest results - likely needing f/u outpatient  Breathing has been at baseline  Continue as needed bronchodilator therapy as well as inhaled steroids  Anemia Uncertain etiology but significant drop Hgb CT abd/pelvis to eval spontaneous  retroperitoneal bleed 1 unit PRBC ordered      DVT prophylaxis:  SCD Pertinent IV fluids/nutrition: none Central lines / invasive devices: n/a  Code Status: DNR Family Communication: none at this time  Disposition: inpatient for now [facility] needs: likely will need rehab SNF  Barriers to discharge / significant pending items: stabilization of Afib, likely needs placement, anticipate d/c in a couple days if able to come off pressors and BP/HR maintaining                     Subjective:  Patient reports some heart racing sensation and stable SOB.        Objective:  Vitals:   10/27/21 0750 10/27/21 0800 10/27/21 0900 10/27/21 1000  BP: (!) 127/97 (!) 142/108 (!) 63/49 (!) 71/61  Pulse: (!) 127 (!) 140 (!) 104 (!) 117  Resp: (!) 29 (!) 27 12 (!) 25  Temp: 98 F (36.7 C)     TempSrc: Oral     SpO2: 100%     Weight:      Height:        Intake/Output Summary (Last 24 hours) at 10/27/2021 1419 Last data filed at 10/27/2021 0900 Gross per 24 hour  Intake 2107.93 ml  Output 300 ml  Net 1807.93 ml   Filed Weights   10/25/21 1111 10/25/21 1525 10/25/21 2105  Weight: 102.3 kg 93 kg 97.8 kg    Examination:  Constitutional:  VS as above General Appearance: alert, well-developed, well-nourished, NAD Eyes: Normal lids and conjunctive, non-icteric sclera Ears, Nose, Mouth, Throat: Normal external appearance MMM Neck: No masses, trachea midline Respiratory: Conversational dyspnea No wheeze No rhonchi No rales Cardiovascular: S1/S2 normal Irreg / tachycardic  No murmur No rub/gallop auscultated No JVD No lower extremity edema Gastrointestinal: No tenderness No masses No hernia appreciated Musculoskeletal:  No clubbing/cyanosis of digits Symmetrical movement in all extremities Neurological: No cranial nerve deficit on limited exam Alert Psychiatric: Normal judgment/insight Normal mood and affect       Scheduled Medications:    acetaminophen  1,000 mg Oral Q8H   Chlorhexidine Gluconate Cloth  6 each Topical Q0600   cyanocobalamin  1,000 mcg Per Tube Daily   docusate sodium  100 mg Oral BID   donepezil  5 mg Per Tube QHS   enoxaparin (LOVENOX) injection  40 mg Subcutaneous Q24H   feeding supplement  237 mL Oral TID BM   ferrous sulfate  325 mg Oral Daily   latanoprost  1 drop Both Eyes QHS   metoprolol tartrate  12.5 mg Oral BID   multivitamin with minerals  1 tablet Oral Daily   pantoprazole (PROTONIX) IV  40 mg Intravenous Q24H   rosuvastatin  10 mg Oral Daily   senna  1 tablet Oral BID   sertraline  25 mg Oral Daily   umeclidinium-vilanterol  1 puff Inhalation Daily    Continuous Infusions:  sodium chloride Stopped (10/27/21 0846)   sodium chloride     methocarbamol (ROBAXIN) IV     phenylephrine (NEO-SYNEPHRINE) Adult infusion Stopped (10/27/21 0802)   potassium chloride 100 mL/hr at 10/27/21 0900    PRN Medications:  bisacodyl, HYDROmorphone (DILAUDID) injection, ketoconazole, methocarbamol **OR** methocarbamol (ROBAXIN) IV, metoCLOPramide **OR** metoCLOPramide (REGLAN) injection, metoprolol tartrate, nitroGLYCERIN, ondansetron **OR** ondansetron (ZOFRAN) IV, oxyCODONE, oxyCODONE, senna-docusate, sodium phosphate, traMADol  Antimicrobials:  Anti-infectives (From admission, onward)    Start     Dose/Rate Route Frequency Ordered Stop   10/26/21 1106  ceFAZolin (ANCEF) IVPB 2g/100 mL premix        2 g 200 mL/hr over 30  Minutes Intravenous Every 6 hours 10/26/21 1106 10/27/21 0040   10/25/21 1516  ceFAZolin (ANCEF) 2-4 GM/100ML-% IVPB       Note to Pharmacy: Maryagnes Amos B: cabinet override      10/25/21 1516 10/27/21 0040   10/25/21 1300  ceFAZolin (ANCEF) IVPB 2g/100 mL premix        2 g 200 mL/hr over 30 Minutes Intravenous  Once 10/25/21 1252 10/25/21 1717       Data Reviewed: I have personally reviewed following labs and imaging studies  CBC: Recent Labs  Lab 10/25/21 1125  10/25/21 2014 10/25/21 2324 10/26/21 0419 10/27/21 0606  WBC 7.3  --   --  10.9* 6.0  NEUTROABS 5.1  --   --   --   --   HGB 13.1 11.1* 9.8* 8.9* 7.5*  HCT 39.7 33.3* 29.3* 26.1* 22.4*  MCV 92.1  --   --  91.6 92.6  PLT 154  --   --  148* [id number]*   Basic Metabolic Panel: Recent Labs  Lab 10/25/21 1125 10/26/21 0419 10/27/21 0606  NA 143 144 139  K 3.8 4.8 4.3  CL 111 117* 113*  CO2 25 23 22   GLUCOSE 130* 148* 133*  BUN 22 22 19   CREATININE 1.03 0.97 1.12  CALCIUM 8.9 8.1* 7.9*  MG 2.1 1.7  --   PHOS  --  3.2  --    GFR: Estimated Creatinine Clearance: 62.6 mL/min (by C-G formula based on SCr of 1.12 mg/dL). Liver Function Tests: Recent Labs  Lab 10/25/21 2324  ALBUMIN 2.8*   No results for input(s): "LIPASE", "AMYLASE" in the last 168 hours. No results for input(s): "AMMONIA" in the last 168 hours. Coagulation Profile: Recent Labs  Lab 10/25/21 1125  INR 1.0   Cardiac Enzymes: No results for input(s): "CKTOTAL", "CKMB", "CKMBINDEX", "TROPONINI" in the last 168 hours. BNP (last 3 results) No results for input(s): "PROBNP" in the last 8760 hours. HbA1C: No results for input(s): "HGBA1C" in the last 72 hours. CBG: Recent Labs  Lab 10/25/21 1906 10/25/21 2104  GLUCAP 125* 155*   Lipid Profile: No results for input(s): "CHOL", "HDL", "LDLCALC", "TRIG", "CHOLHDL", "LDLDIRECT" in the last 72 hours. Thyroid Function Tests: No results for input(s): "TSH", "T4TOTAL", "FREET4", "T3FREE", "THYROIDAB" in the last 72 hours. Anemia Panel: No results for input(s): "VITAMINB12", "FOLATE", "FERRITIN", "TIBC", "IRON", "RETICCTPCT" in the last 72 hours. Urine analysis:    Component Value Date/Time   COLORURINE YELLOW (A) 03/31/2018 1203   APPEARANCEUR CLEAR (A) 03/31/2018 1203   APPEARANCEUR Clear 04/02/2013 1415   LABSPEC 1.020 03/31/2018 1203   LABSPEC 1.015 04/02/2013 1415   PHURINE 5.0 03/31/2018 1203   GLUCOSEU NEGATIVE 03/31/2018 1203   GLUCOSEU Negative  04/02/2013 1415   HGBUR MODERATE (A) 03/31/2018 1203   BILIRUBINUR NEGATIVE 03/31/2018 1203   BILIRUBINUR Negative 04/02/2013 1415   KETONESUR NEGATIVE 03/31/2018 1203   PROTEINUR NEGATIVE 03/31/2018 1203   NITRITE NEGATIVE 03/31/2018 1203   LEUKOCYTESUR TRACE (A) 03/31/2018 1203   LEUKOCYTESUR Negative 04/02/2013 1415   Sepsis Labs: @LABRCNTIP (procalcitonin:4,lacticidven:4)  Recent Results (from the past 240 hour(s))  MRSA Next Gen by PCR, Nasal     Status: None   Collection Time: 10/25/21  9:07 PM   Specimen: Nasal Mucosa; Nasal Swab  Result Value Ref Range Status   MRSA by PCR Next Gen NOT DETECTED NOT DETECTED Final    Comment: (NOTE) The GeneXpert MRSA Assay (FDA approved for NASAL specimens only), is one component of a  comprehensive MRSA colonization surveillance program. It is not intended to diagnose MRSA infection nor to guide or monitor treatment for MRSA infections. Test performance is not FDA approved in patients less than 78 years old. Performed at [facility], [address] [place], [place] [address]          Radiology Studies: DG HIP UNILAT WITH PELVIS 2-3 VIEWS LEFT  Result Date: 10/25/2021 CLINICAL DATA:  ORIF LEFT femur EXAM: DG HIP (WITH OR WITHOUT PELVIS) 2-3V LEFT COMPARISON:  10/25/2021 Fluoroscopy time: 3 minutes 47 seconds Dose: Not provided; please refer to operative records Images: 6 FINDINGS: Osseous demineralization. IM nail with 2 compression screws placed across an intertrochanteric fracture LEFT femur. Significant improvement in fracture fragment alignment since previous exam. Distal locking screw present. IMPRESSION: Post nailing of intertrochanteric fracture LEFT femur. Electronically Signed   By: Lavonia Dana M.D.   On: 10/25/2021 18:42   DG C-Arm 1-60 Min-No Report  Result Date: 10/25/2021 Fluoroscopy was utilized by the requesting physician.  No radiographic interpretation.   CT CHEST WO CONTRAST  Result Date:  10/25/2021 CLINICAL DATA:  Fall.  Pleural effusion. EXAM: CT CHEST WITHOUT CONTRAST TECHNIQUE: Multidetector CT imaging of the chest was performed following the standard protocol without IV contrast. RADIATION DOSE REDUCTION: This exam was performed according to the departmental dose-optimization program which includes automated exposure control, adjustment of the mA and/or kV according to patient size and/or use of iterative reconstruction technique. COMPARISON:  Radiograph of same day.  CT scan of Jul 04, 2017. FINDINGS: Cardiovascular: Atherosclerosis of thoracic aorta is noted without aneurysm formation. Aortic valves calcifications are noted. Normal cardiac size. No pericardial effusion. Mild coronary artery calcifications are noted. Mediastinum/Nodes: No enlarged mediastinal or axillary lymph nodes. Thyroid gland, trachea, and esophagus demonstrate no significant findings. Lungs/Pleura: No pneumothorax or pleural effusion is noted. Reticular densities are noted peripherally in both lungs suggesting chronic interstitial lung disease. Interval development of 6 mm nodule seen in left lower lobe best seen on image number 121 of series 3. Multiple patchy airspace opacities are noted in the right lung which may represent multifocal inflammation, or potentially severe fibrosis secondary to chronic scarring or inflammation. Some degree of mosaic pattern is seen in the left lung suggesting multifocal inflammation or air trapping secondary to small airways disease. Some degree of bronchiectasis is noted in both lower lobes. Upper Abdomen: No acute abnormality. Musculoskeletal: No chest wall mass or suspicious bone lesions identified. IMPRESSION: There is interval development of multiple patchy airspace opacities in the right lung which may represent multifocal inflammation or pneumonia, or potentially severe masslike fibrosis secondary to chronic scarring or inflammation. Some degree of mosaic pattern is also noted in  the left lung suggesting multifocal inflammation or air trapping secondary to small airways disease. There are again noted reticular densities noted peripherally throughout both lungs suggesting chronic interstitial lung disease, as well as some degree of bronchiectasis seen in both lower lobes, most consistent with chronic findings. Interval development of small nodule seen in left lower lobe. Non-contrast chest CT at 6-12 months is recommended. If the nodule is stable at time of repeat CT, then future CT at 18-24 months (from today's scan) is considered optional for low-risk patients, but is recommended for high-risk patients. This recommendation follows the consensus statement: Guidelines for Management of Incidental Pulmonary Nodules Detected on CT Images: From the Fleischner Society 2017; Radiology 2017; 284:228-243. Mild coronary artery calcifications are noted. Aortic valve calcifications are noted. Aortic Atherosclerosis (ICD10-I70.0). Electronically Signed  By: Marijo Conception M.D.   On: 10/25/2021 14:56   DG Chest 1 View  Result Date: 10/25/2021 CLINICAL DATA:  Fall EXAM: CHEST  1 VIEW COMPARISON:  Radiograph 05/16/2019 FINDINGS: Enlarged cardiac silhouette. Appendage occlusion device noted. There is pulmonary vascular congestion with interstitial prominent bilaterally, left greater than right. No pleural effusion. No pneumothorax. No acute osseous abnormality identified on frontal radiographs of the chest. IMPRESSION: Cardiomegaly with pulmonary vascular congestion and interstitial prominence bilaterally, left greater than right, which could reflect a degree of interstitial edema. Electronically Signed   By: Maurine Simmering M.D.   On: 10/25/2021 11:57   DG Hip Unilat With Pelvis 2-3 Views Left  Result Date: 10/25/2021 CLINICAL DATA:  Fall, pain EXAM: DG HIP (WITH OR WITHOUT PELVIS) 2-3V LEFT COMPARISON:  None Available. FINDINGS: Osteopenia. Comminuted, displaced and impacted intratrochanteric  fractures of the left hip. Status post right hip total arthroplasty. No displaced fracture or dislocation of the bony pelvis. Nonobstructive pattern of overlying bowel gas. IMPRESSION: 1. Comminuted, displaced and impacted intratrochanteric fractures of the left hip. 2. Status post right hip total arthroplasty. 3. No displaced fracture or dislocation of the bony pelvis. Electronically Signed   By: Delanna Ahmadi M.D.   On: 10/25/2021 11:52            LOS: 2 days       Emeterio Reeve, DO [facility] 10/27/2021, 2:19 PM   Staff may message me via secure chat in [place]  but this may not receive immediate response,  please page for urgent matters!  If 7PM-7AM, please contact night-coverage [email or web address]  Dictation software was used to generate the above note. Typos may occur and escape review, as with typed/written notes. Please contact Dr Sheppard Coil directly for clarity if needed.

## 2021-10-27 NOTE — Progress Notes (Signed)
  Subjective: 2 Days Post-Op Procedure(s) (LRB): INTRAMEDULLARY (IM) NAIL INTERTROCHANTERIC (Left) Patient reports pain as mild to moderate.   Patient is well, and has had no acute complaints or problems.  Slept much better.  In ICU for Afib.   Plan is to go Skilled nursing facility after hospital stay. Negative for chest pain and shortness of breath Fever: no Gastrointestinal:Negative for nausea and vomiting  Objective: Vital signs in last 24 hours: Temp:  [98.1 F (36.7 C)-98.4 F (36.9 C)] 98.4 F (36.9 C) (09/07 0400) Pulse Rate:  [81-140] 108 (09/07 0615) Resp:  [13-33] 20 (09/07 0615) BP: (80-131)/(47-96) 119/68 (09/07 0615) SpO2:  [98 %-100 %] 99 % (09/07 0400)  Intake/Output from previous day:  Intake/Output Summary (Last 24 hours) at 10/27/2021 0637 Last data filed at 10/27/2021 0600 Gross per 24 hour  Intake 2343.87 ml  Output 300 ml  Net 2043.87 ml    Intake/Output this shift: Total I/O In: 1244.2 [I.V.:1044.2; IV Piggyback:200] Out: 300 [Urine:300]  Labs: Recent Labs    10/25/21 1125 10/25/21 2014 10/25/21 2324 10/26/21 0419 10/27/21 0606  HGB 13.1 11.1* 9.8* 8.9* 7.5*   Recent Labs    10/26/21 0419 10/27/21 0606  WBC 10.9* 6.0  RBC 2.85* 2.42*  HCT 26.1* 22.4*  PLT 148* 105*   Recent Labs    10/25/21 1125 10/26/21 0419  NA 143 144  K 3.8 4.8  CL 111 117*  CO2 25 23  BUN 22 22  CREATININE 1.03 0.97  GLUCOSE 130* 148*  CALCIUM 8.9 8.1*   Recent Labs    10/25/21 1125  INR 1.0     EXAM General - Patient is Alert and Oriented Extremity - Sensation intact distally Dorsiflexion/Plantar flexion intact Compartment soft Dressing/Incision - clean, dry, scant proximal drainage.   Motor Function - intact, moving foot and toes well on exam.   Past Medical History:  Diagnosis Date   Anemia    BPH (benign prostatic hyperplasia)    CHF (congestive heart failure) (HCC)    Diabetes mellitus without complication (HCC)    Dysphagia    GERD  (gastroesophageal reflux disease)    HLD (hyperlipidemia)    HTN (hypertension)    Irregular heart beat    Nocturia    Urinary retention     Assessment/Plan: 2 Days Post-Op Procedure(s) (LRB): INTRAMEDULLARY (IM) NAIL INTERTROCHANTERIC (Left) Principal Problem:   Closed displaced intertrochanteric fracture of left femur (HCC) Active Problems:   Depression   Diastolic CHF, chronic (HCC)   COPD (chronic obstructive pulmonary disease) (HCC)   Atrial fibrillation, chronic (HCC)   Postoperative atrial fibrillation w/ RVR and associated hypotension requiring vasopressor support in ICU   Hypotension after procedure   Malnutrition of moderate degree  Estimated body mass index is 28.45 kg/m as calculated from the following:   Height as of this encounter: 6\' 1"  (1.854 m).   Weight as of this encounter: 97.8 kg. Advance diet Up with therapy  Discharge planning with Care Management.  Follow up at [facility] in 2 weeks for xrays and staple removal.    DVT Prophylaxis - Lovenox, Foot Pumps, and TED hose Weight-Bearing as tolerated to Left leg  [facility], PA-C Orthopaedic Surgery 10/27/2021, 6:37 AM

## 2021-10-27 NOTE — Progress Notes (Signed)
OT Cancellation Note  Patient Details Name: Jeremy Herrera MRN: [id number] DOB: 1940/08/23   Cancelled Treatment:    Reason Eval/Treat Not Completed: Medical issues which prohibited therapy. Chart reviewed. Pt noted with chest pain this afternoon, cardiology work up underway. Will hold OT tx this date and re-attempt at later date/time as medically appropriate.   Arman Filter., MPH, MS, OTR/L ascom [phone] 10/27/21, 4:13 PM

## 2021-10-27 NOTE — Progress Notes (Addendum)
Pt experienced a sudden onset of CP (3 out of 10), neck pain, left shoulder pain, and extremely cold feeling, did stat ekg, gave 1 dose prn nitro, restarted neo, checked temp 97.9, gave warm blankets, gave prn Toradol for pain, notified MD, cardiology consult ordered, pt resting comfortably right now with son at bedside will continue to monitor.

## 2021-10-27 NOTE — Consult Note (Addendum)
[place] NOTE       Patient ID: GENIE MIRABAL MRN: [id number] DOB/AGE: 82/09/1940 82 y.o.  Admit date: 10/25/2021 Referring Physician Dr. Emeterio Reeve  Primary Physician Dr. Ellison Hughs Primary Cardiologist Dr. Nehemiah Massed Reason for Consultation AF RVR  HPI: Janine Ores. Paolo is an 89yoM with a PMH of chronic atrial fibrillation s/p Watchman 2020, mild AS, HFpEF (EF 50%, G1 DD 05/2020), hypertension, hyperlipidemia, type 2 diabetes who presented to [facility] ED 10/25/2021 after mechanical fall.  Left hip x-ray showed a comminuted displaced and impacted left hip intertrochanteric fracture.  He underwent left intramedullary nailing with orthopedic surgery on 9/5, became hypotensive and tachycardic postoperatively requiring vasopressor support in the ICU.  He remains in atrial fibrillation with RVR for which cardiology is consulted.  The patient states he was trying to get some milk out of the fridge when he fell on his left side.  He broke his left hip and underwent intramedullary nailing with orthopedic surgery on 9/5.  He became hypotensive and tachycardic postoperatively in the setting of hypovolemia and anesthesia, metoprolol was held due to this and he was started on a phenylephrine drip.  The patient has remained in A-fib (chronic) had intermittent RVR and hypotension which throughout his admission so far.  Upon my arrival into the room he was resting with his eyes closed, son at bedside heart rate was in the 90s to low 100s in atrial fibrillation.  He was easily arousable and states he feels okay, had some chest pressure, tightness in his neck and left shoulder, and chills earlier this afternoon, improved with warm blankets.  He denies shortness of breath, but appears somewhat dyspneic with conversation that is apparently his baseline.  He admits to some heart racing, but denies dizziness.  Vitals during interview are notable for blood pressure of 89/59 with heart rate  anywhere between 101 tens with paroxysms up to the 120s and 130s in atrial fibrillation on telemetry.  SPO2 greater than 90% on room air.  Labs are notable for a potassium of 4.3, BUN/creatinine 19/1.12 GFR greater than 60.  Magnesium 2.0 hemoglobin declined from 11.1 on 9/5 downtrending daily to 9.8-8.9-7.5 today.  Platelets 105.  CT chest on 9/5 showed patchy airspace opacities in the right lung that may represent multifocal inflammation or pneumonia with severe fibrosis secondary to chronic scarring or inflammation suggesting small airway disease or chronic ILD.  Review of systems complete and found to be negative unless listed above     Past Medical History:  Diagnosis Date   Anemia    BPH (benign prostatic hyperplasia)    CHF (congestive heart failure) (HCC)    Diabetes mellitus without complication (HCC)    Dysphagia    GERD (gastroesophageal reflux disease)    HLD (hyperlipidemia)    HTN (hypertension)    Irregular heart beat    Nocturia    Urinary retention     Past Surgical History:  Procedure Laterality Date   AMPUTATION TOE Left 05/04/2017   Procedure: AMPUTATION TOE - third left toe;  Surgeon: Samara Deist, DPM;  Location: [facility] ORS;  Service: Podiatry;  Laterality: Left;   bladder patching  1964   HIP SURGERY Right 2014   INTRAMEDULLARY (IM) NAIL INTERTROCHANTERIC Left 10/25/2021   Procedure: INTRAMEDULLARY (IM) NAIL INTERTROCHANTERIC;  Surgeon: Leim Fabry, MD;  Location: [facility] ORS;  Service: Orthopedics;  Laterality: Left;   IRRIGATION AND DEBRIDEMENT FOOT Left 11/14/2016   Procedure: IRRIGATION AND DEBRIDEMENT FOOT;  Surgeon: Samara Deist, DPM;  Location: [facility] ORS;  Service: Podiatry;  Laterality: Left;   TRANSURETHRAL RESECTION OF PROSTATE      Medications Prior to Admission  Medication Sig Dispense Refill Last Dose   Acetaminophen 500 MG capsule SMARTSIG:By Mouth      albuterol (VENTOLIN HFA) 108 (90 Base) MCG/ACT inhaler Inhale 1-2 puffs into the lungs every 4  (four) hours as needed.       aspirin 81 MG tablet Take 81 mg by mouth daily.      bimatoprost (LUMIGAN) 0.01 % SOLN 1 drop nightly      clopidogrel (PLAVIX) 75 MG tablet Take by mouth. (Patient not taking: Reported on 08/19/2020)      Cyanocobalamin (B-12) 1000 MCG/ML KIT Inject 1 mL as directed every 30 (thirty) days.      donepezil (ARICEPT) 5 MG tablet Take 5 mg by mouth at bedtime.       dorzolamide-timolol (COSOPT) 22.3-6.8 MG/ML ophthalmic solution Place 1 drop into both eyes 2 (two) times daily.       EMOLLIENT EX Apply topically daily.       esomeprazole (NEXIUM) 20 MG capsule Take 20 mg by mouth daily at 12 noon.      ferrous sulfate 325 (65 FE) MG tablet Take 325 mg by mouth daily.      Fluticasone-Salmeterol (ADVAIR) 250-50 MCG/DOSE AEPB Inhale 1 puff into the lungs as needed.       furosemide (LASIX) 40 MG tablet Take 40 mg by mouth daily.      ketoconazole (NIZORAL) 2 % cream Apply 1 application topically daily as needed.       lisinopril (ZESTRIL) 2.5 MG tablet Take 2.5 mg by mouth daily.       metoprolol succinate (TOPROL-XL) 50 MG 24 hr tablet Take 150 mg by mouth daily.       nitroGLYCERIN (NITROSTAT) 0.4 MG SL tablet Place 0.4 mg under the tongue every 5 (five) minutes as needed.       potassium chloride (K-DUR) 10 MEQ tablet Take 10 mEq by mouth daily.       rosuvastatin (CRESTOR) 10 MG tablet Take 1 tablet by mouth daily.      sertraline (ZOLOFT) 25 MG tablet Take 25 mg by mouth daily.       simethicone (MYLICON) 627 MG chewable tablet Chew 125 mg by mouth every 6 (six) hours as needed.       Skin Protectants, Misc. (REMEDY PHYTOPLEX HYDRAGUARD) CREA Apply topically.      umeclidinium-vilanterol (ANORO ELLIPTA) 62.5-25 MCG/INH AEPB Inhale 1 puff into the lungs daily as needed. Takes daily      vitamin B-12 (CYANOCOBALAMIN) 1000 MCG tablet Take 1 tablet (1,000 mcg total) by mouth daily. 90 tablet 1    Social History   Socioeconomic History   Marital status: Widowed     Spouse name: Not on file   Number of children: Not on file   Years of education: Not on file   Highest education level: Not on file  Occupational History   Not on file  Tobacco Use   Smoking status: Never   Smokeless tobacco: Never  Vaping Use   Vaping Use: Never used  Substance and Sexual Activity   Alcohol use: No   Drug use: No   Sexual activity: Not Currently  Other Topics Concern   Not on file  Social History Narrative   Not on file   Social Determinants of Health   Financial Resource Strain: Not on file  Food Insecurity:  Not on file  Transportation Needs: Not on file  Physical Activity: Not on file  Stress: Not on file  Social Connections: Not on file  Intimate Partner Violence: Not on file    Family History  Problem Relation Age of Onset   Ulcers Mother        stomach   GER disease Sister        GERD   Heart attack Father    Diabetes Brother    Depression Brother    Kidney disease Neg Hx    Prostate cancer Neg Hx    Kidney cancer Neg Hx    Bladder Cancer Neg Hx       PHYSICAL EXAM General: Elderly Caucasian male, well nourished, in no acute distress.  Sitting upright in ICU bed with son at bedside. HEENT:  Normocephalic and atraumatic. Neck:  No JVD.  Lungs: Mildly short of breath appearing on room air.  Clear bilaterally to auscultation. No wheezes, crackles, rhonchi.  Heart: Tachycardic irregularly irregular rhythm. Normal S1 and S2 without gallops or murmurs.  Abdomen: Non-distended appearing.  Msk: Normal strength and tone for age. Extremities: Warm and well perfused. No clubbing, cyanosis.  SCDs in place, without peripheral edema.  Neuro: Alert and oriented X 3. Psych:  Answers questions appropriately.   Labs: Basic Metabolic Panel: Recent Labs    10/25/21 1125 10/26/21 0419 10/27/21 0606  NA 143 144 139  K 3.8 4.8 4.3  CL 111 117* 113*  CO2 _0 GLUCOSE 130* 148* 133*  BUN _1 CREATININE 1.03 0.97 1.12  CALCIUM 8.9 8.1*  7.9*  MG 2.1 1.7  --   PHOS  --  3.2  --    Liver Function Tests: Recent Labs    10/25/21 2324  ALBUMIN 2.8*   No results for input(s): "LIPASE", "AMYLASE" in the last 72 hours. CBC: Recent Labs    10/25/21 1125 10/25/21 2014 10/26/21 0419 10/27/21 0606  WBC 7.3  --  10.9* 6.0  NEUTROABS 5.1  --   --   --   HGB 13.1   < > 8.9* 7.5*  HCT 39.7   < > 26.1* 22.4*  MCV 92.1  --  91.6 92.6  PLT 154  --  148* 105*   < > = values in this interval not displayed.   Cardiac Enzymes: No results for input(s): "CKTOTAL", "CKMB", "CKMBINDEX", "TROPONINIHS" in the last 72 hours. BNP: Invalid input(s): "POCBNP" D-Dimer: No results for input(s): "DDIMER" in the last 72 hours. Hemoglobin A1C: No results for input(s): "HGBA1C" in the last 72 hours. Fasting Lipid Panel: No results for input(s): "CHOL", "HDL", "LDLCALC", "TRIG", "CHOLHDL", "LDLDIRECT" in the last 72 hours. Thyroid Function Tests: No results for input(s): "TSH", "T4TOTAL", "T3FREE", "THYROIDAB" in the last 72 hours.  Invalid input(s): "FREET3" Anemia Panel: No results for input(s): "VITAMINB12", "FOLATE", "FERRITIN", "TIBC", "IRON", "RETICCTPCT" in the last 72 hours.  DG HIP UNILAT WITH PELVIS 2-3 VIEWS LEFT  Result Date: 10/25/2021 CLINICAL DATA:  ORIF LEFT femur EXAM: DG HIP (WITH OR WITHOUT PELVIS) 2-3V LEFT COMPARISON:  10/25/2021 Fluoroscopy time: 3 minutes 47 seconds Dose: Not provided; please refer to operative records Images: 6 FINDINGS: Osseous demineralization. IM nail with 2 compression screws placed across an intertrochanteric fracture LEFT femur. Significant improvement in fracture fragment alignment since previous exam. Distal locking screw present. IMPRESSION: Post nailing of intertrochanteric fracture LEFT femur. Electronically Signed   By: Lavonia Dana M.D.   On: 10/25/2021 18:42  DG C-Arm 1-60 Min-No Report  Result Date: 10/25/2021 Fluoroscopy was utilized by the requesting physician.  No radiographic  interpretation.     Radiology: DG HIP UNILAT WITH PELVIS 2-3 VIEWS LEFT  Result Date: 10/25/2021 CLINICAL DATA:  ORIF LEFT femur EXAM: DG HIP (WITH OR WITHOUT PELVIS) 2-3V LEFT COMPARISON:  10/25/2021 Fluoroscopy time: 3 minutes 47 seconds Dose: Not provided; please refer to operative records Images: 6 FINDINGS: Osseous demineralization. IM nail with 2 compression screws placed across an intertrochanteric fracture LEFT femur. Significant improvement in fracture fragment alignment since previous exam. Distal locking screw present. IMPRESSION: Post nailing of intertrochanteric fracture LEFT femur. Electronically Signed   By: Lavonia Dana M.D.   On: 10/25/2021 18:42   DG C-Arm 1-60 Min-No Report  Result Date: 10/25/2021 Fluoroscopy was utilized by the requesting physician.  No radiographic interpretation.   CT CHEST WO CONTRAST  Result Date: 10/25/2021 CLINICAL DATA:  Fall.  Pleural effusion. EXAM: CT CHEST WITHOUT CONTRAST TECHNIQUE: Multidetector CT imaging of the chest was performed following the standard protocol without IV contrast. RADIATION DOSE REDUCTION: This exam was performed according to the departmental dose-optimization program which includes automated exposure control, adjustment of the mA and/or kV according to patient size and/or use of iterative reconstruction technique. COMPARISON:  Radiograph of same day.  CT scan of Jul 04, 2017. FINDINGS: Cardiovascular: Atherosclerosis of thoracic aorta is noted without aneurysm formation. Aortic valves calcifications are noted. Normal cardiac size. No pericardial effusion. Mild coronary artery calcifications are noted. Mediastinum/Nodes: No enlarged mediastinal or axillary lymph nodes. Thyroid gland, trachea, and esophagus demonstrate no significant findings. Lungs/Pleura: No pneumothorax or pleural effusion is noted. Reticular densities are noted peripherally in both lungs suggesting chronic interstitial lung disease. Interval development of 6 mm nodule  seen in left lower lobe best seen on image number 121 of series 3. Multiple patchy airspace opacities are noted in the right lung which may represent multifocal inflammation, or potentially severe fibrosis secondary to chronic scarring or inflammation. Some degree of mosaic pattern is seen in the left lung suggesting multifocal inflammation or air trapping secondary to small airways disease. Some degree of bronchiectasis is noted in both lower lobes. Upper Abdomen: No acute abnormality. Musculoskeletal: No chest wall mass or suspicious bone lesions identified. IMPRESSION: There is interval development of multiple patchy airspace opacities in the right lung which may represent multifocal inflammation or pneumonia, or potentially severe masslike fibrosis secondary to chronic scarring or inflammation. Some degree of mosaic pattern is also noted in the left lung suggesting multifocal inflammation or air trapping secondary to small airways disease. There are again noted reticular densities noted peripherally throughout both lungs suggesting chronic interstitial lung disease, as well as some degree of bronchiectasis seen in both lower lobes, most consistent with chronic findings. Interval development of small nodule seen in left lower lobe. Non-contrast chest CT at 6-12 months is recommended. If the nodule is stable at time of repeat CT, then future CT at 18-24 months (from today's scan) is considered optional for low-risk patients, but is recommended for high-risk patients. This recommendation follows the consensus statement: Guidelines for Management of Incidental Pulmonary Nodules Detected on CT Images: From the Fleischner Society 2017; Radiology 2017; 284:228-243. Mild coronary artery calcifications are noted. Aortic valve calcifications are noted. Aortic Atherosclerosis (ICD10-I70.0). Electronically Signed   By: Marijo Conception M.D.   On: 10/25/2021 14:56   DG Chest 1 View  Result Date: 10/25/2021 CLINICAL DATA:   Fall EXAM: CHEST  1 VIEW  COMPARISON:  Radiograph 05/16/2019 FINDINGS: Enlarged cardiac silhouette. Appendage occlusion device noted. There is pulmonary vascular congestion with interstitial prominent bilaterally, left greater than right. No pleural effusion. No pneumothorax. No acute osseous abnormality identified on frontal radiographs of the chest. IMPRESSION: Cardiomegaly with pulmonary vascular congestion and interstitial prominence bilaterally, left greater than right, which could reflect a degree of interstitial edema. Electronically Signed   By: Maurine Simmering M.D.   On: 10/25/2021 11:57   DG Hip Unilat With Pelvis 2-3 Views Left  Result Date: 10/25/2021 CLINICAL DATA:  Fall, pain EXAM: DG HIP (WITH OR WITHOUT PELVIS) 2-3V LEFT COMPARISON:  None Available. FINDINGS: Osteopenia. Comminuted, displaced and impacted intratrochanteric fractures of the left hip. Status post right hip total arthroplasty. No displaced fracture or dislocation of the bony pelvis. Nonobstructive pattern of overlying bowel gas. IMPRESSION: 1. Comminuted, displaced and impacted intratrochanteric fractures of the left hip. 2. Status post right hip total arthroplasty. 3. No displaced fracture or dislocation of the bony pelvis. Electronically Signed   By: Delanna Ahmadi M.D.   On: 10/25/2021 11:52    ECHO EF 50%, G1 DD 05/2020  TELEMETRY reviewed by me (LT) 10/27/2021 : Atrial fibrillation with variable heart rates between 90s to 100s to paroxysms in the 120s to 130s, with rest this afternoon mostly in the 90s to 100s.  EKG reviewed by me: Atrial fibrillation rate 125 with significant artifact throughout.  Data reviewed by me (LT) 10/27/2021: Admission H&P, hospitalist progress note, orthopedic surgery progress note, critical care progress note, CBC, BMP, last cardiology note, telemetry, vitals, ordered magnesium  ASSESSMENT AND PLAN:  Jeremy Herrera is an 62yoM with a PMH of chronic atrial fibrillation s/p Watchman 2020, mild AS,  HFpEF (EF 50%, G1 DD 05/2020), hypertension, hyperlipidemia, type 2 diabetes who presented to [facility] ED 10/25/2021 after mechanical fall.  Left hip x-ray showed a comminuted displaced and impacted left hip intertrochanteric fracture.  He underwent left intramedullary nailing with orthopedic surgery on 9/5, became hypotensive and tachycardic postoperatively requiring vasopressor support in the ICU.  He remains in atrial fibrillation with RVR for which cardiology is consulted.  #Left hip fracture s/p IM nailing 9/5 #Chronic atrial fibrillation with RVR s/p Watchman 2020 #Postoperative anemia Presented after a mechanical fall, underwent surgery on 9/5, became hypotensive and tachycardic postoperatively suspected due to hypovolemia versus sedation, responding to IV fluids and phenylephrine.  He has had intermittent tachycardia with heart rates in the 120s to 130s with conversation, although is in the 90s to 100s at rest.  Notably, his hemoglobin has declined from 11.1 down to 7.5 over the past 2 days.  He had some chest pressure, chills, and right-sided neck discomfort, somewhat relieved with nitroglycerin and repeat EKG during that episode had significant artifact but was not obviously ischemic appearing. -Recommend continuing to treat causes of increased adrenergic tone including pain, anemia -Wean phenylephrine as tolerated -Consider transfusion of PRBCs per primary team -Consider repeat chest x-ray versus other imaging per primary -Continue metoprolol tartrate 12.5 mg twice daily, increase as his blood pressure allows -Not on anticoagulation due to history of Watchman device 2020 -Echocardiogram complete performed today, read pending  #Chronic HFpEF -Has received significant IV fluid resuscitation (+4 L) and is somewhat short of breath appearing, but this is apparently his baseline.  Not clinically volume overloaded appearing in his extremities.  No appreciable crackles.  This patient's plan of care was  discussed and created with Dr. Nehemiah Massed and he is in agreement.  Signed: Alanson Puls  Tang , PA-C 10/27/2021, 3:09 PM [facility] Cardiology  The patient has had known chronic atrial fibrillation status post Watchman device mild aortic valve stenosis and previous heart failure but has had a mechanical fall for which he has received hip fracture nailing.  With this there has been concerns of higher heart rate as well as some blood loss.  The higher heart rate likely is secondary to blood loss anemia and hypovolemia.  It would be most important for further treatment of these issues and abstain from medication management due to concerns of side effects including hypotension. I have personally reviewed EKG, telemetry, chest x-ray, laboratory work  The patient has been interviewed and examined. I agree with assessment and plan above. Serafina Royals MD [facility]

## 2021-10-27 NOTE — Progress Notes (Signed)
*  PRELIMINARY RESULTS* Echocardiogram 2D Echocardiogram has been performed.  Cristela Blue 10/27/2021, 12:09 PM

## 2021-10-27 NOTE — Progress Notes (Signed)
Physical Therapy Treatment Patient Details Name: Jeremy Herrera MRN: [id number] DOB: [birth year] Today's Date: 10/27/2021   History of Present Illness Jeremy Herrera is a 81 y.o. male who had a fall earlier today.  The patient noted immediate hip pain and inability to ambulate.  The patient ambulates with a walker at baseline.  The patient lives at home with his daughter. Pain is worse with any sort of movement.  X-rays in the emergency department show a left intertrochanteric hip fracture. Pt is POD 1 from hip nailing.    PT Comments    Pt is making gradual progress towards goals with ability to tolerate sitting at EOB longer this date with improved vitals. Pt with low BP still prohibiting further mobility as pt symptomatic.  Educated/reviewed/performed HEP with assistance. Pt continues to be motivated but very sleepy. Will continue to progress.  Recommendations for follow up therapy are one component of a multi-disciplinary discharge planning process, led by the attending physician.  Recommendations may be updated based on patient status, additional functional criteria and insurance authorization.  Follow Up Recommendations  Skilled nursing-short term rehab (<3 hours/day) Can patient physically be transported by private vehicle: No   Assistance Recommended at Discharge Frequent or constant Supervision/Assistance  Patient can return home with the following Two people to help with walking and/or transfers;Two people to help with bathing/dressing/bathroom   Equipment Recommendations   (TBD)    Recommendations for Other Services       Precautions / Restrictions Precautions Precautions: Fall Restrictions Weight Bearing Restrictions: Yes LLE Weight Bearing: Weight bearing as tolerated     Mobility  Bed Mobility Overal bed mobility: Needs Assistance Bed Mobility: Supine to Sit, Sit to Supine, Rolling Rolling: Mod assist   Supine to sit: Mod assist, +2 for physical  assistance Sit to supine: Max assist, +2 for physical assistance   General bed mobility comments: able to roll to B sides for linen change, mod A +2 for transfers to stitting EOB with cga for static balance. Static sitting vitals improved from previous date (RR 16, HR 92, O2 95% RA), however pt reports quick fatigue and doesn't feel up to standing. Assisted with return back to bed. BP still very low with mobility    Transfers                   General transfer comment: unable    Ambulation/Gait                   Stairs             Wheelchair Mobility    Modified Rankin (Stroke Patients Only)       Balance Overall balance assessment: History of Falls, Needs assistance Sitting-balance support: Bilateral upper extremity supported, Feet supported Sitting balance-Leahy Scale: Fair                                      Cognition Arousal/Alertness: Lethargic Behavior During Therapy: WFL for tasks assessed/performed Overall Cognitive Status: Within Functional Limits for tasks assessed                                 General Comments: sleeping upon arrival, however easily wakes up and is agreeable to participate        Exercises Other Exercises Other Exercises: supine ther-ex performed on B  LE including AP, quad sets, SLR, and hip abd/add. Mod assist for L LE.    General Comments        Pertinent Vitals/Pain Pain Assessment Pain Assessment: Faces Faces Pain Scale: Hurts little more Pain Location: LLE Pain Descriptors / Indicators: Discomfort, Dull, Grimacing, Operative site guarding Pain Intervention(s): Limited activity within patient's tolerance, Repositioned, Premedicated before session    Home Living                          Prior Function            PT Goals (current goals can now be found in the care plan section) Acute Rehab PT Goals Patient Stated Goal: to go home PT Goal Formulation: With  patient Time For Goal Achievement: 11/09/21 Potential to Achieve Goals: Fair Progress towards PT goals: Progressing toward goals    Frequency    7X/week      PT Plan Current plan remains appropriate    Co-evaluation              AM-PAC PT "6 Clicks" Mobility   Outcome Measure  Help needed turning from your back to your side while in a flat bed without using bedrails?: A Lot Help needed moving from lying on your back to sitting on the side of a flat bed without using bedrails?: A Lot Help needed moving to and from a bed to a chair (including a wheelchair)?: A Lot Help needed standing up from a chair using your arms (e.g., wheelchair or bedside chair)?: A Lot Help needed to walk in hospital room?: Total Help needed climbing 3-5 steps with a railing? : Total 6 Click Score: 10    End of Session   Activity Tolerance: Patient limited by pain Patient left: in bed;with nursing/sitter in room Nurse Communication: Mobility status PT Visit Diagnosis: Muscle weakness (generalized) (M62.81);Difficulty in walking, not elsewhere classified (R26.2);Pain;Unsteadiness on feet (R26.81) Pain - Right/Left: Left Pain - part of body: Hip     Time: 2774-1287 PT Time Calculation (min) (ACUTE ONLY): 25 min  Charges:  $Therapeutic Exercise: 8-22 mins $Therapeutic Activity: 8-22 mins                     Elizabeth Palau, PT, DPT, GCS [phone]    Keison Glendinning 10/27/2021, 1:01 PM

## 2021-10-28 ENCOUNTER — Inpatient Hospital Stay: Payer: Medicare PPO

## 2021-10-28 DIAGNOSIS — S72142A Displaced intertrochanteric fracture of left femur, initial encounter for closed fracture: Secondary | ICD-10-CM | POA: Diagnosis not present

## 2021-10-28 DIAGNOSIS — I482 Chronic atrial fibrillation, unspecified: Secondary | ICD-10-CM | POA: Diagnosis not present

## 2021-10-28 DIAGNOSIS — J449 Chronic obstructive pulmonary disease, unspecified: Secondary | ICD-10-CM | POA: Diagnosis not present

## 2021-10-28 DIAGNOSIS — F325 Major depressive disorder, single episode, in full remission: Secondary | ICD-10-CM | POA: Diagnosis not present

## 2021-10-28 LAB — BASIC METABOLIC PANEL
Anion gap: 1 — ABNORMAL LOW (ref 5–15)
BUN: 19 mg/dL (ref 8–23)
CO2: 22 mmol/L (ref 22–32)
Calcium: 8 mg/dL — ABNORMAL LOW (ref 8.9–10.3)
Chloride: 116 mmol/L — ABNORMAL HIGH (ref 98–111)
Creatinine, Ser: 0.98 mg/dL (ref 0.61–1.24)
GFR, Estimated: >60 mL/min (ref >60)
Glucose, Bld: 122 mg/dL — ABNORMAL HIGH (ref 70–99)
Potassium: 3.9 mmol/L (ref 3.5–5.1)
Sodium: 139 mmol/L (ref 135–145)

## 2021-10-28 LAB — BPAM RBC
Blood Product Expiration Date: 202310142359
ISSUE DATE / TIME: 202309071745
Unit Type and Rh: 5100

## 2021-10-28 LAB — CBC
HCT: 24.7 % — ABNORMAL LOW (ref 39.0–52.0)
Hemoglobin: 8.3 g/dL — ABNORMAL LOW (ref 13.0–17.0)
MCH: 30.6 pg (ref 26.0–34.0)
MCHC: 33.6 g/dL (ref 30.0–36.0)
MCV: 91.1 fL (ref 80.0–100.0)
Platelets: 89 10*3/uL — ABNORMAL LOW (ref 150–400)
RBC: 2.71 MIL/uL — ABNORMAL LOW (ref 4.22–5.81)
RDW: 15.5 % (ref 11.5–15.5)
WBC: 6.4 10*3/uL (ref 4.0–10.5)
nRBC: 0.3 % — ABNORMAL HIGH (ref 0.0–0.2)

## 2021-10-28 LAB — TYPE AND SCREEN
ABO/RH(D): O POS
Antibody Screen: NEGATIVE
Unit division: 0

## 2021-10-28 LAB — MAGNESIUM: Magnesium: 2 mg/dL (ref 1.7–2.4)

## 2021-10-28 MED ORDER — DIPHENHYDRAMINE HCL 12.5 MG/5ML PO ELIX
12.5000 mg | ORAL_SOLUTION | Freq: Once | ORAL | Status: AC
  Administered 2021-10-28: 12.5 mg via ORAL
  Filled 2021-10-28: qty 5

## 2021-10-28 MED ORDER — PANTOPRAZOLE SODIUM 40 MG PO TBEC
40.0000 mg | DELAYED_RELEASE_TABLET | Freq: Two times a day (BID) | ORAL | Status: DC
  Administered 2021-10-28 – 2021-11-04 (×14): 40 mg via ORAL
  Filled 2021-10-28 (×14): qty 1

## 2021-10-28 MED ORDER — FUROSEMIDE 10 MG/ML IJ SOLN
40.0000 mg | Freq: Two times a day (BID) | INTRAMUSCULAR | Status: AC
Start: 2021-10-28 — End: 2021-10-28
  Administered 2021-10-28 (×2): 40 mg via INTRAVENOUS
  Filled 2021-10-28 (×2): qty 4

## 2021-10-28 MED ORDER — OXYCODONE HCL 5 MG PO TABS: 2.5000 mg | ORAL_TABLET | ORAL | 0 refills | Status: DC | PRN

## 2021-10-28 MED ORDER — SODIUM CHLORIDE 0.9 % IV SOLN: INTRAVENOUS | Status: DC

## 2021-10-28 MED ORDER — ONDANSETRON HCL 4 MG PO TABS
4.0000 mg | ORAL_TABLET | Freq: Four times a day (QID) | ORAL | 0 refills | Status: DC | PRN
Start: 2021-10-28 — End: 2021-12-16

## 2021-10-28 MED ORDER — METOPROLOL TARTRATE 25 MG PO TABS
12.5000 mg | ORAL_TABLET | Freq: Three times a day (TID) | ORAL | Status: DC
  Administered 2021-10-28 – 2021-11-02 (×15): 12.5 mg via ORAL
  Filled 2021-10-28 (×15): qty 1

## 2021-10-28 MED ORDER — TRAMADOL HCL 50 MG PO TABS: 50.0000 mg | ORAL_TABLET | Freq: Four times a day (QID) | ORAL | 0 refills | Status: DC | PRN

## 2021-10-28 MED ORDER — METHOCARBAMOL 500 MG PO TABS
500.0000 mg | ORAL_TABLET | Freq: Four times a day (QID) | ORAL | 0 refills | Status: DC | PRN
Start: 2021-10-28 — End: 2021-12-16

## 2021-10-28 NOTE — Plan of Care (Signed)
Continuing with plan of care. 

## 2021-10-28 NOTE — Progress Notes (Signed)
Physical Therapy Treatment Patient Details Name: Jeremy Herrera MRN: [id number] DOB: 1940-12-03 Today's Date: 10/28/2021   History of Present Illness Jeremy Herrera is a 81 y.o. male who had a fall earlier today.  The patient noted immediate hip pain and inability to ambulate.  The patient ambulates with a walker at baseline.  The patient lives at home with his daughter. Pain is worse with any sort of movement.  X-rays in the emergency department show a left intertrochanteric hip fracture. Pt is POD 1 from hip nailing.    PT Comments    Pt seen for PT tx with MD clearing pt for participation in bed level exercises despite low BP. Pt engaged in LLE strengthening exercises with multimodal cuing for technique. Pt does note fatigue & needing to rest at end of session. Pt received with nasal cannula in nose but on room air & cannula removed - nurse aware.  SpO2 on room air 100% HR 92-106 bpm BP in RUE at beginning of session: 82/68 mmHg MAP 75 BP in RUE at end of session: 95/61 mmHg MAP 73     Recommendations for follow up therapy are one component of a multi-disciplinary discharge planning process, led by the attending physician.  Recommendations may be updated based on patient status, additional functional criteria and insurance authorization.  Follow Up Recommendations  Skilled nursing-short term rehab (<3 hours/day) Can patient physically be transported by private vehicle: No   Assistance Recommended at Discharge Frequent or constant Supervision/Assistance  Patient can return home with the following Two people to help with walking and/or transfers;Two people to help with bathing/dressing/bathroom;Help with stairs or ramp for entrance;Assist for transportation   Equipment Recommendations   (TBD in next venue)    Recommendations for Other Services       Precautions / Restrictions Precautions Precautions: Fall Restrictions Weight Bearing Restrictions: Yes LLE Weight Bearing:  Weight bearing as tolerated     Mobility  Bed Mobility                    Transfers                        Ambulation/Gait                   Stairs             Wheelchair Mobility    Modified Rankin (Stroke Patients Only)       Balance                                            Cognition Arousal/Alertness: Awake/alert Behavior During Therapy: WFL for tasks assessed/performed Overall Cognitive Status: Within Functional Limits for tasks assessed                                 General Comments: appears to not feel well at end of session but doesn't verbalize what's wrong        Exercises Total Joint Exercises Ankle Circles/Pumps: AROM, Strengthening, Both, 10 reps, Supine Quad Sets: AROM, Strengthening, Left, 10 reps, AAROM Heel Slides: AAROM, Supine, Strengthening, Left, 10 reps Hip ABduction/ADduction: AAROM, Strengthening, Supine, Left, 10 reps    General Comments        Pertinent Vitals/Pain Pain Assessment Pain Assessment: Faces  Faces Pain Scale: Hurts a little bit Pain Location: LLE Pain Descriptors / Indicators: Grimacing Pain Intervention(s): Monitored during session, Repositioned, Limited activity within patient's tolerance    Home Living                          Prior Function            PT Goals (current goals can now be found in the care plan section) Acute Rehab PT Goals Patient Stated Goal: to go home PT Goal Formulation: With patient Time For Goal Achievement: 11/09/21 Potential to Achieve Goals: Fair Progress towards PT goals: Progressing toward goals    Frequency    7X/week      PT Plan Current plan remains appropriate    Co-evaluation              AM-PAC PT "6 Clicks" Mobility   Outcome Measure  Help needed turning from your back to your side while in a flat bed without using bedrails?: A Lot Help needed moving from lying on your back to  sitting on the side of a flat bed without using bedrails?: Total Help needed moving to and from a bed to a chair (including a wheelchair)?: Total Help needed standing up from a chair using your arms (e.g., wheelchair or bedside chair)?: Total Help needed to walk in hospital room?: Total Help needed climbing 3-5 steps with a railing? : Total 6 Click Score: 7    End of Session   Activity Tolerance: Patient tolerated treatment well;Patient limited by fatigue Patient left: in bed;with call bell/phone within reach Nurse Communication: Mobility status (pt not feeling well at end of session) PT Visit Diagnosis: Muscle weakness (generalized) (M62.81);Difficulty in walking, not elsewhere classified (R26.2);Unsteadiness on feet (R26.81)     Time: 4193-7902 PT Time Calculation (min) (ACUTE ONLY): 12 min  Charges:  $Therapeutic Exercise: 8-22 mins                     Aleda Grana, PT, DPT 10/28/21, 12:22 PM    Sandi Mariscal 10/28/2021, 12:20 PM

## 2021-10-28 NOTE — Progress Notes (Signed)
PROGRESS NOTE    Jeremy Herrera   RSW:[id number] DOB: [birth year]  DOA: 10/25/2021 Date of Service: 10/28/21 PCP: Marina Goodell, MD     Brief Narrative / Hospital Course:  Jeremy Herrera is a 81 y.o. male with medical history significant for chronic diastolic dysfunction CHF, hypertension, dyslipidemia, A-fib (BB, Plavix, ASA - NOT on anticoagulation), BPH who presents to the ER from home on 10/25/2021 via EMS for evaluation of left hip pain following a mechanical fall. No syncope. Chronic SOB. Uses roller walker at baseline.  09/05: X-ray of the left hip shows comminuted, displaced and impacted intratrochanteric fractures of the left hip. Status post right hip total arthroplasty. No displaced fracture or dislocation of the bony pelvis. CT chest: interval development of multiple patchy airspace opacities in the right lung which may represent multifocal inflammation or pneumonia, or potentially severe masslike fibrosis secondary to chronic scarring or inflammation. 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 Orthopedic surgery performed intramedullary nailing of L femur with cephalomedullary device. Postoperative Afib RVR requiring vasopressor support, ICU consulted and maintaining phenylephrine gtt.  09/06: tachycardiac and soft BP overnight improving, tachypnea about the same. Hgb slight drop to 8.9 postop. Received albumin overnight. Still on phenylephrine this morning.  09/07: BP improved and HR up, low dose IV lopressor given but BP dropped.  Continues to have significant dyspnea though he states this is about at baseline.  Echocardiogram ordered. Cardiology consulted: Recommending continuing to treat causes increased adrenergic tone including pain/anemia, wean phenylephrine as tolerated, consider repeat CXR and transfusion PRBC, okay for continuing metoprolol 12.5 mg twice daily, echocardiogram pending.  Given anemia, patient was scanned for rule out retroperitoneal bleed, no concerns  there.  Was amenable to receiving 1 unit PRBC. 09/08: Remains tachycardic/tachypneic, this morning requiring 2 LNC.  Hgb up to 8.3.  CXR this a.m. Increased bilateral lung opacities are noted concerning for edema or atypical inflammation.  Stopped IV fluids, IV Lasix 40 mg x 2, increase metoprolol to tartrate 12.5 mg to 3 times daily, echocardiogram showed EF 60-65, normal LV function, no RWMA, diastolic parameters indeterminate.  Remains off Precedex but blood pressure is low at 77/53 late this morning.  Consultants:  Orthopedics (Dr. Signa Kell) Cardiology [facility] clinic, Dr. Gwen Pounds)  Procedures: 09/05: Repair L hip fracture (intramedullary nailing of L femur with cephalomedullary device)      ASSESSMENT & PLAN:   Principal Problem:   Closed displaced intertrochanteric fracture of left femur (HCC) Active Problems:   Depression   Diastolic CHF, chronic (HCC)   COPD (chronic obstructive pulmonary disease) (HCC)   Atrial fibrillation, chronic (HCC)   Postoperative atrial fibrillation w/ RVR and associated hypotension requiring vasopressor support in ICU  Closed displaced intertrochanteric fracture of left femur (HCC) Status post mechanical fall with  closed intertrochanteric fracture of left femur Orthopedic surgery following, today 10/28/21 he is POD3 s/p Repair L hip fracture (intramedullary nailing of L femur with cephalomedullary device) morphine IV, robaxin and will start acetaminophen, consider oxycodone if pain not well controlled  Postoperative atrial fibrillation w/ RVR and associated hypotension requiring vasopressor support in ICU Patient not on long-term anticoagulation therapy but has a Watchman device in place Metoprolol was held for surgery, restarted metoprolol tartrate 12.5 mg twice daily Initially thought likely hypovolemia however did not seem to be improving with fluid resuscitation 500 mL IV fluids given postop (caution w/ Hx HFpEF) Requiring phenylephrine  gtt --> now off of this Low dose IV beta blocker for RVR, taking care  w/ hypotension Holding other cardiac and anti-hypertensive meds d/t hypotension Telemetry Echocardiogram EF 60-65, normal LV function, no RWMA, diastolic parameters indeterminate Cardiology consult:  Recommending continuing to treat causes increased adrenergic tone including pain/anemia,  wean phenylephrine as tolerated -he has been off of this since yesterday, repeat CXR which showed increased vascular congestion and lasix was ordered today 09/08  transfusion 1 unit PRBC 09/07 metoprolol 12.5 mg twice daily 09/07 --> increased to tid [id number]  Diastolic CHF, chronic (HCC) - does not appear to be in acute exacerbation Patient has a history of chronic diastolic dysfunction CHF with last known 2D echocardiogram showed an LVEF of 50% from 2022 Chest x-ray in ED showed findings suggestive of pulmonary vascular congestion, received 1 dose of Lasix 20 mg IV, improved but then recurred based on CXR morning of 10/28/2021, received another round of Lasix. Restart lisinopril when BP allows Echocardiogram as above cardiology consult as above  Depression Continue sertraline  COPD (chronic obstructive pulmonary disease) (HCC) Stable and not acutely exacerbated though there is concern for chronic inflammation/ILD See CT chest results - likely needing f/u outpatient  Breathing has been at baseline  Continue as needed bronchodilator therapy as well as inhaled steroids  Anemia Uncertain etiology but significant drop Hgb seems more than shoule expect postop CT abd/pelvis to eval spontaneous retroperitoneal bleed 09/07- negative 1 unit PRBC 09/07 Hemoccult pending Consider GI consult       DVT prophylaxis: SCD Pertinent IV fluids/nutrition: none Central lines / invasive devices: n/a  Code Status: DNR Family Communication: daughter at bedside yesterday, will attempt to call son/daughter again today   Disposition: inpatient for  now [facility] needs: likely will need rehab SNF  Barriers to discharge / significant pending items: stabilization of Afib, likely needs placement, anticipate d/c once able to come off pressors and BP/HR maintaining but has been hypotensive, tachycardic, tachypneic.                    Subjective:  Patient reports feeling okay today, no significant SOB though he is having conversational dyspnea, appears stable from previous.  Denies chest pain, headache, vision change.  Is tolerating diet.  Is tolerating physical therapy in bed.       Objective:  Vitals:   10/28/21 0500 10/28/21 0600 10/28/21 0700 10/28/21 0800  BP: 122/83 135/86 129/81 99/61  Pulse: (!) 109 (!) 103 (!) 109 (!) 104  Resp: 20 (!) 31 (!) 33 (!) 29  Temp:    98.2 F (36.8 C)  TempSrc:    Oral  SpO2:    100%  Weight:      Height:        Intake/Output Summary (Last 24 hours) at 10/28/2021 0824 Last data filed at 10/28/2021 0600 Gross per 24 hour  Intake 1926.11 ml  Output 900 ml  Net 1026.11 ml    Filed Weights   10/25/21 1111 10/25/21 1525 10/25/21 2105  Weight: 102.3 kg 93 kg 97.8 kg    Examination:  Constitutional:  VS as above General Appearance: alert, well-developed, well-nourished, NAD Eyes: Normal lids and conjunctive, non-icteric sclera Ears, Nose, Mouth, Throat: Normal external appearance MMM Neck: No masses, trachea midline Respiratory: Conversational dyspnea No wheeze No rhonchi No rales Cardiovascular: S1/S2 normal Irreg / tachycardic  No murmur No rub/gallop auscultated No JVD No lower extremity edema Gastrointestinal: No tenderness No masses No hernia appreciated Musculoskeletal:  No clubbing/cyanosis of digits Neurological: No cranial nerve deficit on limited exam Alert Psychiatric: Normal judgment/insight Normal mood  and affect       Scheduled Medications:   acetaminophen  1,000 mg Oral Q8H   Chlorhexidine Gluconate Cloth  6 each Topical Q0600    cyanocobalamin  1,000 mcg Per Tube Daily   docusate sodium  100 mg Oral BID   donepezil  5 mg Per Tube QHS   feeding supplement  237 mL Oral TID BM   ferrous sulfate  325 mg Oral Daily   latanoprost  1 drop Both Eyes QHS   metoprolol tartrate  12.5 mg Oral BID   multivitamin with minerals  1 tablet Oral Daily   pantoprazole (PROTONIX) IV  40 mg Intravenous Q24H   rosuvastatin  10 mg Oral Daily   senna  1 tablet Oral BID   sertraline  25 mg Oral Daily   umeclidinium-vilanterol  1 puff Inhalation Daily    Continuous Infusions:  sodium chloride 75 mL/hr at 10/28/21 0400   sodium chloride     methocarbamol (ROBAXIN) IV     phenylephrine (NEO-SYNEPHRINE) Adult infusion Stopped (10/27/21 1547)   potassium chloride Stopped (10/27/21 0946)    PRN Medications:  bisacodyl, HYDROmorphone (DILAUDID) injection, ketoconazole, methocarbamol **OR** methocarbamol (ROBAXIN) IV, metoCLOPramide **OR** metoCLOPramide (REGLAN) injection, metoprolol tartrate, nitroGLYCERIN, ondansetron **OR** ondansetron (ZOFRAN) IV, oxyCODONE, oxyCODONE, senna-docusate, sodium phosphate, traMADol  Antimicrobials:  Anti-infectives (From admission, onward)    Start     Dose/Rate Route Frequency Ordered Stop   10/26/21 1106  ceFAZolin (ANCEF) IVPB 2g/100 mL premix        2 g 200 mL/hr over 30 Minutes Intravenous Every 6 hours 10/26/21 1106 10/27/21 0040   10/25/21 1516  ceFAZolin (ANCEF) 2-4 GM/100ML-% IVPB       Note to Pharmacy: Maryagnes Amos B: cabinet override      10/25/21 1516 10/27/21 0040   10/25/21 1300  ceFAZolin (ANCEF) IVPB 2g/100 mL premix        2 g 200 mL/hr over 30 Minutes Intravenous  Once 10/25/21 1252 10/25/21 1717       Data Reviewed: I have personally reviewed following labs and imaging studies  CBC: Recent Labs  Lab 10/25/21 1125 10/25/21 2014 10/25/21 2324 10/26/21 0419 10/27/21 0606 10/27/21 2125 10/28/21 0402  WBC 7.3  --   --  10.9* 6.0  --  6.4  NEUTROABS 5.1  --   --    --   --   --   --   HGB 13.1   < > 9.8* 8.9* 7.5* 8.2* 8.3*  HCT 39.7   < > 29.3* 26.1* 22.4* 24.5* 24.7*  MCV 92.1  --   --  91.6 92.6  --  91.1  PLT 154  --   --  148* 105*  --  89*   < > = values in this interval not displayed.    Basic Metabolic Panel: Recent Labs  Lab 10/25/21 1125 10/26/21 0419 10/27/21 0606 10/27/21 1504 10/28/21 0402  NA 143 144 139  --  139  K 3.8 4.8 4.3  --  3.9  CL 111 117* 113*  --  116*  CO2 25 23 22   --  22  GLUCOSE 130* 148* 133*  --  122*  BUN 22 22 19   --  19  CREATININE 1.03 0.97 1.12  --  0.98  CALCIUM 8.9 8.1* 7.9*  --  8.0*  MG 2.1 1.7  --  2.0 2.0  PHOS  --  3.2  --   --   --     GFR: Estimated  Creatinine Clearance: 71.6 mL/min (by C-G formula based on SCr of 0.98 mg/dL). Liver Function Tests: Recent Labs  Lab 10/25/21 2324  ALBUMIN 2.8*    No results for input(s): "LIPASE", "AMYLASE" in the last 168 hours. No results for input(s): "AMMONIA" in the last 168 hours. Coagulation Profile: Recent Labs  Lab 10/25/21 1125  INR 1.0    Cardiac Enzymes: No results for input(s): "CKTOTAL", "CKMB", "CKMBINDEX", "TROPONINI" in the last 168 hours. BNP (last 3 results) No results for input(s): "PROBNP" in the last 8760 hours. HbA1C: No results for input(s): "HGBA1C" in the last 72 hours. CBG: Recent Labs  Lab 10/25/21 1906 10/25/21 2104  GLUCAP 125* 155*    Lipid Profile: No results for input(s): "CHOL", "HDL", "LDLCALC", "TRIG", "CHOLHDL", "LDLDIRECT" in the last 72 hours. Thyroid Function Tests: No results for input(s): "TSH", "T4TOTAL", "FREET4", "T3FREE", "THYROIDAB" in the last 72 hours. Anemia Panel: No results for input(s): "VITAMINB12", "FOLATE", "FERRITIN", "TIBC", "IRON", "RETICCTPCT" in the last 72 hours. Urine analysis:    Component Value Date/Time   COLORURINE YELLOW (A) 03/31/2018 1203   APPEARANCEUR CLEAR (A) 03/31/2018 1203   APPEARANCEUR Clear 04/02/2013 1415   LABSPEC 1.020 03/31/2018 1203   LABSPEC  1.015 04/02/2013 1415   PHURINE 5.0 03/31/2018 1203   GLUCOSEU NEGATIVE 03/31/2018 1203   GLUCOSEU Negative 04/02/2013 1415   HGBUR MODERATE (A) 03/31/2018 1203   BILIRUBINUR NEGATIVE 03/31/2018 1203   BILIRUBINUR Negative 04/02/2013 1415   KETONESUR NEGATIVE 03/31/2018 1203   PROTEINUR NEGATIVE 03/31/2018 1203   NITRITE NEGATIVE 03/31/2018 1203   LEUKOCYTESUR TRACE (A) 03/31/2018 1203   LEUKOCYTESUR Negative 04/02/2013 1415   Sepsis Labs: @LABRCNTIP (procalcitonin:4,lacticidven:4)  Recent Results (from the past 240 hour(s))  MRSA Next Gen by PCR, Nasal     Status: None   Collection Time: 10/25/21  9:07 PM   Specimen: Nasal Mucosa; Nasal Swab  Result Value Ref Range Status   MRSA by PCR Next Gen NOT DETECTED NOT DETECTED Final    Comment: (NOTE) The GeneXpert MRSA Assay (FDA approved for NASAL specimens only), is one component of a comprehensive MRSA colonization surveillance program. It is not intended to diagnose MRSA infection nor to guide or monitor treatment for MRSA infections. Test performance is not FDA approved in patients less than 74 years old. Performed at [facility], [address] [place], [place] [address]          Radiology Studies: DG HIP UNILAT WITH PELVIS 2-3 VIEWS LEFT  Result Date: 10/25/2021 CLINICAL DATA:  ORIF LEFT femur EXAM: DG HIP (WITH OR WITHOUT PELVIS) 2-3V LEFT COMPARISON:  10/25/2021 Fluoroscopy time: 3 minutes 47 seconds Dose: Not provided; please refer to operative records Images: 6 FINDINGS: Osseous demineralization. IM nail with 2 compression screws placed across an intertrochanteric fracture LEFT femur. Significant improvement in fracture fragment alignment since previous exam. Distal locking screw present. IMPRESSION: Post nailing of intertrochanteric fracture LEFT femur. Electronically Signed   By: Lavonia Dana M.D.   On: 10/25/2021 18:42   DG C-Arm 1-60 Min-No Report  Result Date: 10/25/2021 Fluoroscopy was utilized by the  requesting physician.  No radiographic interpretation.   CT CHEST WO CONTRAST  Result Date: 10/25/2021 CLINICAL DATA:  Fall.  Pleural effusion. EXAM: CT CHEST WITHOUT CONTRAST TECHNIQUE: Multidetector CT imaging of the chest was performed following the standard protocol without IV contrast. RADIATION DOSE REDUCTION: This exam was performed according to the departmental dose-optimization program which includes automated exposure control, adjustment of the mA and/or kV according to  patient size and/or use of iterative reconstruction technique. COMPARISON:  Radiograph of same day.  CT scan of Jul 04, 2017. FINDINGS: Cardiovascular: Atherosclerosis of thoracic aorta is noted without aneurysm formation. Aortic valves calcifications are noted. Normal cardiac size. No pericardial effusion. Mild coronary artery calcifications are noted. Mediastinum/Nodes: No enlarged mediastinal or axillary lymph nodes. Thyroid gland, trachea, and esophagus demonstrate no significant findings. Lungs/Pleura: No pneumothorax or pleural effusion is noted. Reticular densities are noted peripherally in both lungs suggesting chronic interstitial lung disease. Interval development of 6 mm nodule seen in left lower lobe best seen on image number 121 of series 3. Multiple patchy airspace opacities are noted in the right lung which may represent multifocal inflammation, or potentially severe fibrosis secondary to chronic scarring or inflammation. Some degree of mosaic pattern is seen in the left lung suggesting multifocal inflammation or air trapping secondary to small airways disease. Some degree of bronchiectasis is noted in both lower lobes. Upper Abdomen: No acute abnormality. Musculoskeletal: No chest wall mass or suspicious bone lesions identified. IMPRESSION: There is interval development of multiple patchy airspace opacities in the right lung which may represent multifocal inflammation or pneumonia, or potentially severe masslike fibrosis  secondary to chronic scarring or inflammation. Some degree of mosaic pattern is also noted in the left lung suggesting multifocal inflammation or air trapping secondary to small airways disease. There are again noted reticular densities noted peripherally throughout both lungs suggesting chronic interstitial lung disease, as well as some degree of bronchiectasis seen in both lower lobes, most consistent with chronic findings. Interval development of small nodule seen in left lower lobe. Non-contrast chest CT at 6-12 months is recommended. If the nodule is stable at time of repeat CT, then future CT at 18-24 months (from today's scan) is considered optional for low-risk patients, but is recommended for high-risk patients. This recommendation follows the consensus statement: Guidelines for Management of Incidental Pulmonary Nodules Detected on CT Images: From the Fleischner Society 2017; Radiology 2017; 284:228-243. Mild coronary artery calcifications are noted. Aortic valve calcifications are noted. Aortic Atherosclerosis (ICD10-I70.0). Electronically Signed   By: Lupita Raider M.D.   On: 10/25/2021 14:56   DG Chest 1 View  Result Date: 10/25/2021 CLINICAL DATA:  Fall EXAM: CHEST  1 VIEW COMPARISON:  Radiograph 05/16/2019 FINDINGS: Enlarged cardiac silhouette. Appendage occlusion device noted. There is pulmonary vascular congestion with interstitial prominent bilaterally, left greater than right. No pleural effusion. No pneumothorax. No acute osseous abnormality identified on frontal radiographs of the chest. IMPRESSION: Cardiomegaly with pulmonary vascular congestion and interstitial prominence bilaterally, left greater than right, which could reflect a degree of interstitial edema. Electronically Signed   By: Caprice Renshaw M.D.   On: 10/25/2021 11:57   DG Hip Unilat With Pelvis 2-3 Views Left  Result Date: 10/25/2021 CLINICAL DATA:  Fall, pain EXAM: DG HIP (WITH OR WITHOUT PELVIS) 2-3V LEFT COMPARISON:  None  Available. FINDINGS: Osteopenia. Comminuted, displaced and impacted intratrochanteric fractures of the left hip. Status post right hip total arthroplasty. No displaced fracture or dislocation of the bony pelvis. Nonobstructive pattern of overlying bowel gas. IMPRESSION: 1. Comminuted, displaced and impacted intratrochanteric fractures of the left hip. 2. Status post right hip total arthroplasty. 3. No displaced fracture or dislocation of the bony pelvis. Electronically Signed   By: Jearld Lesch M.D.   On: 10/25/2021 11:52    DG Chest Port 1 View  Result Date: 10/28/2021 CLINICAL DATA:  Shortness of breath, chest pain. EXAM: PORTABLE CHEST 1  VIEW COMPARISON:  October 25, 2021. FINDINGS: Stable cardiomegaly. Increased coarse reticular opacities are noted throughout both lungs concerning for edema or atypical inflammation. Elevated right hemidiaphragm is noted. Bony thorax is unremarkable. IMPRESSION: Increased bilateral lung opacities are noted concerning for edema or atypical inflammation. Electronically Signed   By: Marijo Conception M.D.   On: 10/28/2021 08:31   CT ABDOMEN PELVIS WO CONTRAST  Result Date: 10/27/2021 CLINICAL DATA:  Retroperitoneal bleed suspected eval for retroperitoneal bleed EXAM: CT ABDOMEN AND PELVIS WITHOUT CONTRAST TECHNIQUE: Multidetector CT imaging of the abdomen and pelvis was performed following the standard protocol without IV contrast. RADIATION DOSE REDUCTION: This exam was performed according to the departmental dose-optimization program which includes automated exposure control, adjustment of the mA and/or kV according to patient size and/or use of iterative reconstruction technique. COMPARISON:  CT abdomen pelvis 12/10/2011 trauma x-ray left hip 10/25/2021 FINDINGS: Lower chest: Peribronchovascular consolidation within the visualized lung bases. Peripheral reticulations noted. Left heart Amplatzer device noted. Aortic vascular calcifications. At least 3 vessel coronary artery  calcification. Cardiac findings suggestive of anemia trace left pleural effusion. Hepatobiliary: No focal liver abnormality. No gallstones, gallbladder wall thickening, or pericholecystic fluid. No biliary dilatation. Pancreas: Diffusely atrophic. No focal lesion. Otherwise normal pancreatic contour. No surrounding inflammatory changes. No main pancreatic ductal dilatation. Spleen: Normal in size without focal abnormality. Adrenals/Urinary Tract: No adrenal nodule bilaterally. Bilateral kidneys enhance symmetrically. No hydronephrosis. No hydroureter. The urinary bladder is unremarkable. Stomach/Bowel: Stomach is within normal limits. No evidence of bowel wall thickening or dilatation. Colonic diverticulosis. The appendix is not definitely identified with no inflammatory changes in the right lower quadrant to suggest acute appendicitis. Vascular/Lymphatic: No abdominal aorta or iliac aneurysm. Moderate to severe atherosclerotic plaque of the aorta and its branches. No abdominal, pelvic, or inguinal lymphadenopathy. Reproductive: Not visualized due to streak artifact originating from bilateral femoral surgical hardware. Other: No intraperitoneal free fluid. No intraperitoneal free gas. No organized fluid collection. No retroperitoneal hematoma formation. Musculoskeletal: No abdominal wall hernia or abnormality. Subcutaneus soft tissue edema and emphysema along the left hip. Left hip 2.5 x 1.8 cm subcutaneus soft tissue hematoma formation. Overlying skin staples. Atrophic degeneration of the right iliopsoas muscle. No suspicious lytic or blastic osseous lesions. No acute displaced fracture. Multilevel degenerative changes of the spine. Total right hip arthroplasty partially visualized. Proximal femur fracture status post intramedullary fixation partially visualized. IMPRESSION: 1. No retroperitoneal hematoma. 2. Colonic diverticulosis with no acute diverticulitis. 3. Cardiac findings suggestive of anemia. 4.  Peribronchovascular consolidation within the visualized lung bases suggestive of acute infection/inflammation. Peripheral reticulations likely representing underlying fibrosis. 5. Trace left pleural effusion. 6.  Aortic Atherosclerosis (ICD10-I70.0). Electronically Signed   By: Iven Finn M.D.   On: 10/27/2021 18:09   ECHOCARDIOGRAM COMPLETE  Result Date: 10/27/2021    ECHOCARDIOGRAM REPORT   Patient Name:   Jeremy Herrera Date of Exam: 10/27/2021 Medical Rec #:  [id number]         Height:       73.0 in Accession #:    [id number]        Weight:       215.6 lb Date of Birth:  [birth year]          BSA:          2.221 m Patient Age:    56 years          BP:           71/61 mmHg Patient Gender: M  HR:           117 bpm. Exam Location:  [facility] Procedure: 2D Echo, Cardiac Doppler and Color Doppler Indications:     Atrial Fibrillation I 48.91  History:         Patient has prior history of Echocardiogram examinations, most                  recent 05/17/2019. CHF; Risk Factors:Diabetes, Hypertension and                  Dyslipidemia.  Sonographer:     Sherrie Sport Referring Phys:  [id number] Emeterio Reeve Diagnosing Phys: Ida Rogue MD  Sonographer Comments: Suboptimal apical window and Technically challenging study due to limited acoustic windows. IMPRESSIONS  1. Left ventricular ejection fraction, by estimation, is 60 to 65%. The left ventricle has normal function. The left ventricle has no regional wall motion abnormalities. Left ventricular diastolic parameters are indeterminate.  2. Right ventricular systolic function is normal. The right ventricular size is normal. There is normal pulmonary artery systolic pressure. The estimated right ventricular systolic pressure is 123456 mmHg.  3. Left atrial size was severely dilated.  4. The mitral valve is normal in structure. No evidence of mitral valve regurgitation. No evidence of mitral stenosis.  5. The aortic valve was not well visualized. There is  moderate calcification of the aortic valve. Aortic valve regurgitation is not visualized. Aortic valve sclerosis is present, with no evidence of aortic valve stenosis. Aortic valve area, by VTI measures 1.29 cm. Aortic valve mean gradient measures 6.3 mmHg. Aortic valve Vmax measures 1.69 m/s.  6. The inferior vena cava is normal in size with greater than 50% respiratory variability, suggesting right atrial pressure of 3 mmHg. FINDINGS  Left Ventricle: Left ventricular ejection fraction, by estimation, is 60 to 65%. The left ventricle has normal function. The left ventricle has no regional wall motion abnormalities. The left ventricular internal cavity size was normal in size. There is  no left ventricular hypertrophy. Left ventricular diastolic parameters are indeterminate. Right Ventricle: The right ventricular size is normal. No increase in right ventricular wall thickness. Right ventricular systolic function is normal. There is normal pulmonary artery systolic pressure. The tricuspid regurgitant velocity is 1.78 m/s, and  with an assumed right atrial pressure of 5 mmHg, the estimated right ventricular systolic pressure is 123456 mmHg. Left Atrium: Left atrial size was severely dilated. Right Atrium: Right atrial size was normal in size. Pericardium: There is no evidence of pericardial effusion. Mitral Valve: The mitral valve is normal in structure. No evidence of mitral valve regurgitation. No evidence of mitral valve stenosis. Tricuspid Valve: The tricuspid valve is normal in structure. Tricuspid valve regurgitation is mild . No evidence of tricuspid stenosis. Aortic Valve: The aortic valve was not well visualized. There is moderate calcification of the aortic valve. Aortic valve regurgitation is not visualized. Aortic valve sclerosis is present, with no evidence of aortic valve stenosis. Aortic valve mean gradient measures 6.3 mmHg. Aortic valve peak gradient measures 11.4 mmHg. Aortic valve area, by VTI  measures 1.29 cm. Pulmonic Valve: The pulmonic valve was normal in structure. Pulmonic valve regurgitation is not visualized. No evidence of pulmonic stenosis. Aorta: The aortic root is normal in size and structure. Venous: The inferior vena cava is normal in size with greater than 50% respiratory variability, suggesting right atrial pressure of 3 mmHg. IAS/Shunts: No atrial level shunt detected by color flow Doppler.  LEFT VENTRICLE PLAX 2D LVIDd:  5.00 cm LVIDs:         4.15 cm LV PW:         1.40 cm LV IVS:        1.10 cm LVOT diam:     2.00 cm LV SV:         40 LV SV Index:   18 LVOT Area:     3.14 cm  RIGHT VENTRICLE RV Basal diam:  4.10 cm RV S prime:     11.10 cm/s TAPSE (M-mode): 2.7 cm LEFT ATRIUM            Index        RIGHT ATRIUM           Index LA diam:      5.50 cm  2.48 cm/m   RA Area:     21.90 cm LA Vol (A2C): 104.0 ml 46.82 ml/m  RA Volume:   70.70 ml  31.83 ml/m LA Vol (A4C): 184.0 ml 82.83 ml/m  AORTIC VALVE AV Area (Vmax):    1.15 cm AV Area (Vmean):   1.05 cm AV Area (VTI):     1.29 cm AV Vmax:           169.00 cm/s AV Vmean:          117.667 cm/s AV VTI:            0.307 m AV Peak Grad:      11.4 mmHg AV Mean Grad:      6.3 mmHg LVOT Vmax:         62.00 cm/s LVOT Vmean:        39.400 cm/s LVOT VTI:          0.126 m LVOT/AV VTI ratio: 0.41  AORTA Ao Root diam: 3.60 cm MITRAL VALVE                TRICUSPID VALVE MV Area (PHT): 4.63 cm     TR Peak grad:   12.7 mmHg MV Decel Time: 164 msec     TR Vmax:        178.00 cm/s MV E velocity: 112.00 cm/s                             SHUNTS                             Systemic VTI:  0.13 m                             Systemic Diam: 2.00 cm Ida Rogue MD Electronically signed by Ida Rogue MD Signature Date/Time: 10/27/2021/4:19:09 PM    Final            LOS: 3 days       Emeterio Reeve, DO [facility] 10/28/2021, 8:24 AM   Staff may message me via secure chat in [place]  but this may not receive immediate  response,  please page for urgent matters!  If 7PM-7AM, please contact night-coverage [email or web address]  Dictation software was used to generate the above note. Typos may occur and escape review, as with typed/written notes. Please contact Dr Sheppard Coil directly for clarity if needed.

## 2021-10-28 NOTE — Discharge Instructions (Addendum)
INSTRUCTIONS AFTER Surgery  Remove items at home which could result in a fall. This includes throw rugs or furniture in walking pathways ICE to the affected joint every three hours while awake for 30 minutes at a time, for at least the first 3-5 days, and then as needed for pain and swelling.  Continue to use ice for pain and swelling. You may notice swelling that will progress down to the foot and ankle.  This is normal after surgery.  Elevate your leg when you are not up walking on it.   Continue to use the breathing machine you got in the hospital (incentive spirometer) which will help keep your temperature down.  It is common for your temperature to cycle up and down following surgery, especially at night when you are not up moving around and exerting yourself.  The breathing machine keeps your lungs expanded and your temperature down.   DIET:  As you were doing prior to hospitalization, we recommend a well-balanced diet.  DRESSING / WOUND CARE / SHOWERING  Dressing changes needed.  Staples will be removed in 2 weeks at [facility] clinic orthopedics.  No showering.  ACTIVITY  Increase activity slowly as tolerated, but follow the weight bearing instructions below.   No driving for 6 weeks or until further direction given by your physician.  You cannot drive while taking narcotics.  No lifting or carrying greater than 10 lbs. until further directed by your surgeon. Avoid periods of inactivity such as sitting longer than an hour when not asleep. This helps prevent blood clots.  You may return to work once you are authorized by your doctor.     WEIGHT BEARING  Weightbearing as tolerated on the left   EXERCISES Gait training and ambulation training.  CONSTIPATION  Constipation is defined medically as fewer than three stools per week and severe constipation as less than one stool per week.  Even if you have a regular bowel pattern at home, your normal regimen is likely to be disrupted due  to multiple reasons following surgery.  Combination of anesthesia, postoperative narcotics, change in appetite and fluid intake all can affect your bowels.   YOU MUST use at least one of the following options; they are listed in order of increasing strength to get the job done.  They are all available over the counter, and you may need to use some, POSSIBLY even all of these options:    Drink plenty of fluids (prune juice may be helpful) and high fiber foods Colace 100 mg by mouth twice a day  Senokot for constipation as directed and as needed Dulcolax (bisacodyl), take with full glass of water  Miralax (polyethylene glycol) once or twice a day as needed.  If you have tried all these things and are unable to have a bowel movement in the first 3-4 days after surgery call either your surgeon or your primary doctor.    If you experience loose stools or diarrhea, hold the medications until you stool forms back up.  If your symptoms do not get better within 1 week or if they get worse, check with your doctor.  If you experience "the worst abdominal pain ever" or develop nausea or vomiting, please contact the office immediately for further recommendations for treatment.   ITCHING:  If you experience itching with your medications, try taking only a single pain pill, or even half a pain pill at a time.  You can also use Benadryl over the counter for itching or  also to help with sleep.   TED HOSE STOCKINGS:  Use stockings on both legs until for at least 2 weeks or as directed by physician office. They may be removed at night for sleeping.  MEDICATIONS:  See your medication summary on the "After Visit Summary" that nursing will review with you.  You may have some home medications which will be placed on hold until you complete the course of blood thinner medication.  It is important for you to complete the blood thinner medication as prescribed.  PRECAUTIONS:  If you experience chest pain or shortness of  breath - call 911 immediately for transfer to the hospital emergency department.   If you develop a fever greater that 101 F, purulent drainage from wound, increased redness or drainage from wound, foul odor from the wound/dressing, or calf pain - CONTACT YOUR SURGEON.                                                   FOLLOW-UP APPOINTMENTS:  If you do not already have a post-op appointment, please call the office for an appointment to be seen by your surgeon.  Guidelines for how soon to be seen are listed in your "After Visit Summary", but are typically between 1-4 weeks after surgery.  OTHER INSTRUCTIONS:     MAKE SURE YOU:  Understand these instructions.  Get help right away if you are not doing well or get worse.    Thank you for letting us be a part of your medical care team.  It is a privilege we respect greatly.  We hope these instructions will help you stay on track for a fast and full recovery!

## 2021-10-28 NOTE — Progress Notes (Addendum)
Speech Language Pathology Treatment: Dysphagia  Patient Details Name: Jeremy Herrera MRN: [id number] DOB: [birth year] Today's Date: 10/28/2021 Time: 0786-7544 SLP Time Calculation (min) (ACUTE ONLY): 35 min  Assessment / Plan / Recommendation Clinical Impression  Pt seen for f/u assessment of toleration of diet; education of his Baseline Dysphagia and aspiration precautions. Pt alert; Mild Confusion -- has Baseline Cognitive decline per chart. Pt also has Baseline of REFLUX/GERD.  On Pump Back O2 2L; afebrile, WBC WNL.   Pt does have baseline dysphagia evidenced by results of the MBSS 03/16/15: "moderate oropharyngeal dysphagia consistent with neuromuscular impairment as well as the effects of laryngopharyngeal Reflux (inflammation, edema, and resultant decreased sensation of the larynx and pharynx). Pt also had esophageal dilation ~10 years ago which he reported alleviated several of his dysphagia symptoms but suspects this could be an ongoing issue for him.". No further f/u w/ GI nor endoscopy noted in chart.   Pt noted to present with throat clearing intermittently (sensation of saliva at laryngeal inlet?) prior to any PO trials - and was unchanged with PO. He reported that he will throat clear consistently throughout the day IN ABSENCE OF oral intake. He reported he experiences "choking" incidents intermittently at baseline. He describes and states the swallow strategies he utilizes including small bites and sips, thorough mastication, subsequent swallows, and alternating foods/liquids(liquid washes) "help me not choke".   The patient denied any acute changes in swallow function - dysphagia is at baseline he stated. Pt consumed po trials of thin liquids via straw (he and Son stated he drinks from a straw at home d/t difficulty holding a cup w/ his weak hands) w/ no immediate coughing noted; no decline in vocal quality or respiratory status during po trials. Noted mild throat clearing -- almost like a  tic/habit followed post all trial sips. Pt stated he is aware of the need to follow strategies and aspiration precautions to reduce his risk for aspiration. Pt has had no recent report of PNA -- last CXR was in 2021. He reports thorough oral care, and pt has been ambulatory at baseline as much as he can.    With small boluses of thin liquids alternated w/ soft, moist foods, pt feels he "manages ok". He stated he "knows" that when he tries to eat foods more tough and difficult for him that he "has more trouble so I have to be more careful". Neuromuscular impairment as well as the effects of laryngopharyngeal reflux (inflammation, edema, and resultant decreased sensation of the larynx and pharynx) were suggested on MBSS. Pt has reported hx of reflux for which he takes Nexium.  Pt is admitted this admission for Fall w/ a left intertrochanteric hip fracture. Pt is POD 1 from hip nailing. Increased sedentary status w/ Chronic Dysphagia could increase risk for aspiration pneumonia.  Gave Incentive Spirometer for respiratory exs d/t sedentary status  Recommend continue dysphagia 3 diet with thin liquids and adherence to aspiration precautions and compensatory swallowing strategies in setting of Chronic Dysphagia and risk for aspiration/aspiration pneumonia. Pt and Son are aware of this. Pt is able to state back swallowing strategies and precautions he follows. Recommend rest breaks during meals; No Talking during oral intake. Setup support at meals. Pills in Puree for swallowing and clearing of the Esophagus. Handouts given on aspiration precautions and Dysphagia drink cups in order to lessen risk of aspiration.  Recommend f/u w/ GI/ENT if any further c/o Esophageal phase issues/food dysmotility as he has the h/o Esophageal dilation, REFLUX. NSG  to reconsult if any change/decline in status while admitted. MD updated.      HPI HPI: Jeremy Herrera is a 81 y.o. male with medical history significant for chronic  diastolic dysfunction CHF, hypertension, dyslipidemia, A-fib, BPH who presents to the ER via EMS for evaluation of left hip pain following a mechanical fall.  Pt has a Baseline of Dysphagia -- see MBSS in 2017 which revealed oropharyngeal phase dysphagia and "study is consistent with neuromuscular impairment as well as the effects of laryngopharyngeal reflux (inflammation, edema, and resultant decreased sensation of the larynx and pharynx).  Recommend consult with ENT and management of REFLUX.".   Chest CT 10/25/21: There is interval development of multiple patchy airspace opacities in the right lung which may represent multifocal inflammation or pneumonia, or potentially severe masslike fibrosis secondary to chronic scarring or inflammation. Some degree of mosaic pattern is also noted in the left lung suggesting multifocal inflammation or air trapping secondary to small airways disease. There are again noted reticular densities noted peripherally throughout both lungs suggesting chronic interstitial lung disease as well as some degree of bronchiectasis seen in both lower lobes most consistent with chronic findings. Interval development of small nodule seen in left lower lobe. CX Chest: Cardiomegaly with pulmonary vascular congestion and interstitial prominence bilaterally, left greater than right, which could reflect a degree of interstitial edema.  Pt has reported hx of REFLUX for which he takes Nexium at home per pt.  He also reports that he will throat clear consistently throughout the day IN ABSENCE OF oral intake; reports intermittent episodes of choking/coughing at home at baseline. He recalls his dysphagia and need for aspiration precautions of small bites/sips slowly.      SLP Plan  All goals met      Recommendations for follow up therapy are one component of a multi-disciplinary discharge planning process, led by the attending physician.  Recommendations may be updated based on patient status,  additional functional criteria and insurance authorization.    Recommendations  Diet recommendations: Dysphagia 3 (mechanical soft);Thin liquid Liquids provided via: Cup;Straw (monitor carefully -- pt stated he uses a straw at home d/t difficulty holding the Cup so information on the RIJE Dysphagia cup given, also PROVALE cup.) Medication Administration: Whole meds with puree (vs need to CRUSH IN PUREE) Supervision: Patient able to self feed;Staff to assist with self feeding;Intermittent supervision to cue for compensatory strategies Compensations: Minimize environmental distractions;Slow rate;Small sips/bites;Lingual sweep for clearance of pocketing;Multiple dry swallows after each bite/sip;Follow solids with liquid Postural Changes and/or Swallow Maneuvers: Out of bed for meals;Seated upright 90 degrees;Upright 30-60 min after meal (REFLUX precautions)                General recommendations:  (Palliative Care f/u for [place] and education on dysphagia impact; Dietician f/u) Oral Care Recommendations: Oral care BID;Oral care before and after PO;Staff/trained caregiver to provide oral care Follow Up Recommendations:  (TBD) Assistance recommended at discharge: Intermittent Supervision/Assistance (for follow through w/ aspiration precautions) SLP Visit Diagnosis: Dysphagia, oropharyngeal phase (R13.12) (baseline dysphagia; REFLUX) Plan: All goals met            Orinda Kenner, MS, [place]; [place] [phone] (ascom) Yevonne Yokum  10/28/2021, 2:51 PM

## 2021-10-28 NOTE — NC FL2 (Signed)
[place] MEDICAID FL2 LEVEL OF CARE SCREENING TOOL     IDENTIFICATION  Patient Name: Jeremy Herrera Birthdate: [birth year] Sex: male Admission Date (Current Location): 10/25/2021  [facility] and [place] Number:  Chiropodist and Address:  [facility], [address], [place], [place] [address]      Provider Number: [id number]  Attending Physician Name and Address:  Sunnie Nielsen, DO  Relative Name and Phone Number:  Virgina Evener- daughter- [phone]    Current Level of Care: Hospital Recommended Level of Care: Skilled Nursing Facility Prior Approval Number:    Date Approved/Denied:   PASRR Number: [id number]  Discharge Plan: SNF    Current Diagnoses: Patient Active Problem List   Diagnosis Date Noted   Postoperative atrial fibrillation w/ RVR and associated hypotension requiring vasopressor support in ICU 10/26/2021   Malnutrition of moderate degree 10/26/2021   Hypotension after procedure    Closed displaced intertrochanteric fracture of left femur (HCC) 10/25/2021   Diastolic CHF, chronic (HCC) 10/25/2021   COPD (chronic obstructive pulmonary disease) (HCC) 10/25/2021   Atrial fibrillation, chronic (HCC) 10/25/2021   Mild aortic valve stenosis 11/05/2019   Acute respiratory failure with hypoxia (HCC) 05/16/2019   CAP (community acquired pneumonia) 05/16/2019   Acute on chronic systolic CHF (congestive heart failure) (HCC) 05/16/2019   HLD (hyperlipidemia) 05/16/2019   Depression 05/16/2019   Lower limb ulcer, calf, left, limited to breakdown of skin (HCC) 05/06/2019   Swelling of limb 05/06/2019   CAD (coronary artery disease) 08/07/2018   Erythema of lower extremity 08/07/2018   Mild protein-calorie malnutrition (HCC) 06/18/2018   Acute respiratory distress 06/12/2018   Dyspnea 06/12/2018   Altered mental status 08/19/2017   Headache 08/19/2017   Tinnitus 08/19/2017   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HCC) 05/02/2017   Unsteadiness  04/12/2017   Lung nodule 03/11/2017   Nausea 12/18/2016   Diabetic foot ulcer (HCC) 11/13/2016   Leg pain 05/07/2016   Acute on chronic combined systolic and diastolic heart failure (HCC) 05/06/2016   Atrial fibrillation with rapid ventricular response (HCC) 05/06/2016   High risk medication use 11/23/2015   Dysphagia 10/02/2015   Loss of memory 04/29/2015   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   Venous insufficiency of both lower extremities 12/17/2014   Bilateral lower extremity edema 11/23/2014   Nocturia 10/17/2014   History of urinary retention 10/17/2014   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   B12 deficiency 09/30/2014   Right leg swelling 09/30/2014   Anemia 09/16/2014   Thrombocytopenia (HCC) 09/16/2014   Benign essential HTN 06/08/2014   Combined fat and carbohydrate induced hyperlipemia 06/08/2014   Beat, premature ventricular 12/03/2013   Heart valve disease 12/03/2013   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   Ventricular premature depolarization 12/03/2013   Benign fibroma of prostate 11/20/2013   Diabetes mellitus, type 2 (HCC) 11/20/2013   Acid reflux 11/20/2013   Anemia, iron deficiency 11/20/2013   Abnormal presence of protein in urine 11/20/2013   Peripheral sensory neuropathy 11/20/2013   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    Orientation RESPIRATION BLADDER Height & Weight     Self, Time, Place, Situation  Normal Continent Weight: 97.8 kg Height:  6\' 1"  (185.4 cm)  BEHAVIORAL SYMPTOMS/MOOD NEUROLOGICAL BOWEL NUTRITION STATUS      Continent Diet (see discharge summary)  AMBULATORY STATUS COMMUNICATION OF NEEDS Skin   Extensive Assist Verbally Surgical wounds (left thigh incision with honeycomb dressing)  Personal Care Assistance Level of Assistance  Bathing, Feeding, Dressing Bathing Assistance: Maximum assistance Feeding assistance: Limited assistance Dressing Assistance: Maximum assistance      Functional Limitations Info  Sight, Hearing, Speech Sight Info: Adequate Hearing Info: Adequate Speech Info: Adequate    SPECIAL CARE FACTORS FREQUENCY  PT (By licensed PT), OT (By licensed OT)     PT Frequency: 5 times per week OT Frequency: 5 times per week            Contractures Contractures Info: Not present    Additional Factors Info  Code Status, Allergies Code Status Info: DNR Allergies Info: Hydrocodone-acetaminophen           Current Medications (10/28/2021):  This is the current hospital active medication list Current Facility-Administered Medications  Medication Dose Route Frequency Provider Last Rate Last Admin   acetaminophen (TYLENOL) tablet 1,000 mg  1,000 mg Oral Q8H Signa Kell, MD   1,000 mg at 10/28/21 1558   bisacodyl (DULCOLAX) suppository 10 mg  10 mg Rectal Daily PRN Signa Kell, MD       Chlorhexidine Gluconate Cloth 2 % PADS 6 each  6 each Topical Q0600 Agbata, Tochukwu, MD   6 each at 10/27/21 1110   cyanocobalamin (VITAMIN B12) tablet 1,000 mcg  1,000 mcg Per Tube Daily Signa Kell, MD   1,000 mcg at 10/28/21 9509   docusate sodium (COLACE) capsule 100 mg  100 mg Oral BID Signa Kell, MD   100 mg at 10/28/21 3267   donepezil (ARICEPT) tablet 5 mg  5 mg Per Tube QHS Signa Kell, MD   5 mg at 10/27/21 2151   feeding supplement (ENSURE ENLIVE / ENSURE PLUS) liquid 237 mL  237 mL Oral TID BM Sunnie Nielsen, DO   237 mL at 10/27/21 2109   ferrous sulfate tablet 325 mg  325 mg Oral Daily Signa Kell, MD   325 mg at 10/28/21 1245   furosemide (LASIX) injection 40 mg  40 mg Intravenous BID Rebeca Allegra, PA-C   40 mg at 10/28/21 1225   HYDROmorphone (DILAUDID) injection 0.2-0.4 mg  0.2-0.4 mg Intravenous Q4H PRN Signa Kell, MD   0.2 mg at 10/27/21 0241   ketoconazole (NIZORAL) 2 % cream 1 Application  1 Application Topical Daily PRN Rust-Chester, Cecelia Byars, NP   1 Application at 10/28/21 0048   latanoprost (XALATAN) 0.005 % ophthalmic  solution 1 drop  1 drop Both Eyes QHS Signa Kell, MD   1 drop at 10/27/21 2152   methocarbamol (ROBAXIN) tablet 500 mg  500 mg Oral Q6H PRN Signa Kell, MD       Or   methocarbamol (ROBAXIN) 500 mg in dextrose 5 % 50 mL IVPB  500 mg Intravenous Q6H PRN Signa Kell, MD       metoCLOPramide (REGLAN) tablet 5-10 mg  5-10 mg Oral Q8H PRN Signa Kell, MD       Or   metoCLOPramide (REGLAN) injection 5-10 mg  5-10 mg Intravenous Q8H PRN Signa Kell, MD       metoprolol tartrate (LOPRESSOR) injection 2.5 mg  2.5 mg Intravenous TID PRN Sunnie Nielsen, DO   2.5 mg at 10/28/21 8099   metoprolol tartrate (LOPRESSOR) tablet 12.5 mg  12.5 mg Oral TID Rebeca Allegra, PA-C   12.5 mg at 10/28/21 1558   multivitamin with minerals tablet 1 tablet  1 tablet Oral Daily Sunnie Nielsen, DO   1 tablet at 10/27/21 0755   nitroGLYCERIN (NITROSTAT) SL tablet 0.4 mg  0.4 mg Sublingual Q5 min PRN Signa Kell, MD   0.4 mg at 10/27/21 1420   ondansetron (ZOFRAN) tablet 4 mg  4 mg Oral Q6H PRN Signa Kell, MD       Or   ondansetron [facility]) injection 4 mg  4 mg Intravenous Q6H PRN Signa Kell, MD       oxyCODONE (Oxy IR/ROXICODONE) immediate release tablet 2.5-5 mg  2.5-5 mg Oral Q4H PRN Signa Kell, MD   5 mg at 10/27/21 1606   oxyCODONE (Oxy IR/ROXICODONE) immediate release tablet 5-10 mg  5-10 mg Oral Q4H PRN Signa Kell, MD   10 mg at 10/28/21 1557   pantoprazole (PROTONIX) EC tablet 40 mg  40 mg Oral BID AC Alexander, Dorene Grebe, DO       phenylephrine (NEO-SYNEPHRINE) 20mg /NS premix infusion  25-200 mcg/min Intravenous Titrated Agbata, Tochukwu, MD   Stopped at 10/27/21 1547   potassium chloride 10 mEq in 100 mL IVPB  10 mEq Intravenous Daily 12/27/21, RPH   Stopped at 10/28/21 1405   rosuvastatin (CRESTOR) tablet 10 mg  10 mg Oral Daily 12/28/21, MD   10 mg at 10/28/21 12/28/21   senna (SENOKOT) tablet 8.6 mg  1 tablet Oral BID 1610, MD   8.6 mg at 10/28/21 12/28/21    senna-docusate (Senokot-S) tablet 1 tablet  1 tablet Oral QHS PRN 9604, MD       sertraline (ZOLOFT) tablet 25 mg  25 mg Oral Daily Signa Kell, MD   25 mg at 10/28/21 12/28/21   sodium phosphate (FLEET) 7-19 GM/118ML enema 1 enema  1 enema Rectal Once PRN 5409, MD       traMADol Signa Kell) tablet 50 mg  50 mg Oral Q6H PRN Janean Sark, MD   50 mg at 10/27/21 1429   umeclidinium-vilanterol (ANORO ELLIPTA) 62.5-25 MCG/ACT 1 puff  1 puff Inhalation Daily 03-21-1985, RPH   1 puff at 10/28/21 12/28/21     Discharge Medications: Please see discharge summary for a list of discharge medications.  Relevant Imaging Results:  Relevant Lab Results:   Additional Information SS# [id number]  [id number], RN

## 2021-10-28 NOTE — Progress Notes (Signed)
  Subjective: 3 Days Post-Op Procedure(s) (LRB): INTRAMEDULLARY (IM) NAIL INTERTROCHANTERIC (Left) Patient reports pain as moderate this morning.  C/o CP, which is being addressed.   Patient is well, and has had no acute complaints or problems.  Slept off and on this AM.  In ICU for Afib.  Cardiology consulted. Plan is to go Skilled nursing facility after hospital stay. Negative for chest pain and shortness of breath Fever: no Gastrointestinal:Negative for nausea and vomiting  Objective: Vital signs in last 24 hours: Temp:  [98 F (36.7 C)-98.4 F (36.9 C)] 98.2 F (36.8 C) (09/08 0400) Pulse Rate:  [81-141] 103 (09/08 0600) Resp:  [12-36] 31 (09/08 0600) BP: (63-142)/(47-108) 135/86 (09/08 0600) SpO2:  [97 %-100 %] 100 % (09/08 0400)  Intake/Output from previous day:  Intake/Output Summary (Last 24 hours) at 10/28/2021 0657 Last data filed at 10/28/2021 0600 Gross per 24 hour  Intake 1926.11 ml  Output 900 ml  Net 1026.11 ml    Intake/Output this shift: Total I/O In: 955.8 [I.V.:713.8; Blood:242] Out: 400 [Urine:400]  Labs: Recent Labs    10/25/21 2324 10/26/21 0419 10/27/21 0606 10/27/21 2125 10/28/21 0402  HGB 9.8* 8.9* 7.5* 8.2* 8.3*   Recent Labs    10/27/21 0606 10/27/21 2125 10/28/21 0402  WBC 6.0  --  6.4  RBC 2.42*  --  2.71*  HCT 22.4* 24.5* 24.7*  PLT 105*  --  89*   Recent Labs    10/27/21 0606 10/28/21 0402  NA 139 139  K 4.3 3.9  CL 113* 116*  CO2 22 22  BUN 19 19  CREATININE 1.12 0.98  GLUCOSE 133* 122*  CALCIUM 7.9* 8.0*   Recent Labs    10/25/21 1125  INR 1.0     EXAM General - Patient is Alert and Oriented Extremity - Sensation intact distally Dorsiflexion/Plantar flexion intact Compartment soft Dressing/Incision - clean, dry, scant proximal drainage.   Motor Function - intact, moving foot and toes well on exam. Ambulated with assistance to chair.  Past Medical History:  Diagnosis Date   Anemia    BPH (benign  prostatic hyperplasia)    CHF (congestive heart failure) (HCC)    Diabetes mellitus without complication (HCC)    Dysphagia    GERD (gastroesophageal reflux disease)    HLD (hyperlipidemia)    HTN (hypertension)    Irregular heart beat    Nocturia    Urinary retention     Assessment/Plan: 3 Days Post-Op Procedure(s) (LRB): INTRAMEDULLARY (IM) NAIL INTERTROCHANTERIC (Left) Principal Problem:   Closed displaced intertrochanteric fracture of left femur (HCC) Active Problems:   Depression   Diastolic CHF, chronic (HCC)   COPD (chronic obstructive pulmonary disease) (HCC)   Atrial fibrillation, chronic (HCC)   Postoperative atrial fibrillation w/ RVR and associated hypotension requiring vasopressor support in ICU   Hypotension after procedure   Malnutrition of moderate degree  Estimated body mass index is 28.45 kg/m as calculated from the following:   Height as of this encounter: 6\' 1"  (1.854 m).   Weight as of this encounter: 97.8 kg. Advance diet Up with therapy  Discharge planning with Care Management.  Follow up at [facility] in 2 weeks for xrays and staple removal.  Prescriptions in the chart.  DVT Prophylaxis - Lovenox, Foot Pumps, and TED hose Weight-Bearing as tolerated to Left leg  [facility], PA-C Orthopaedic Surgery 10/28/2021, 6:57 AM

## 2021-10-28 NOTE — TOC Initial Note (Signed)
Transition of Care [facility]) - Initial/Assessment Note    Patient Details  Name: Jeremy Herrera MRN: [id number] Date of Birth: 02-08-1941  [facility] CM/SW Contact:    Shelbie Hutching, RN Phone Number: 10/28/2021, 1:52 PM  Clinical Narrative:                 RNCM met with patient at the bedside yesterday.  RNCM introduced self and explained role.  Patient is from home with his daughter, he is pretty independent at home, he uses a rollator and also has a cane, travel chair, and regular wheelchair.  Daughter Graciella Belton provides transportation.  Patient has been to short term rehab in the past and agrees to go at discharge.  He has been to [facility] in [place] and [facility] in [place] and [facility] in [place] before they were [facility].  RNCM will start bed search.   Patient is not currently medically stable for discharge.   Expected Discharge Plan: Skilled Nursing Facility Barriers to Discharge: Continued Medical Work up   Patient Goals and CMS Choice Patient states their goals for this hospitalization and ongoing recovery are:: Patient agrees to short term rehab CMS Medicare.gov Compare Post Acute Care list provided to:: Patient Choice offered to / list presented to : Patient  Expected Discharge Plan and Services Expected Discharge Plan: [place]   Discharge Planning Services: CM Consult Post Acute Care Choice: [place] Living arrangements for the past 2 months: Single Family Home                 DME Arranged: N/A DME Agency: NA       HH Arranged: NA HH Agency: NA        Prior Living Arrangements/Services Living arrangements for the past 2 months: Single Family Home Lives with:: Adult Children Patient language and need for interpreter reviewed:: Yes Do you feel safe going back to the place where you live?: Yes      Need for Family Participation in Patient Care: Yes (Comment) Care giver support system in place?: Yes  (comment) Current home services: DME Criminal Activity/Legal Involvement Pertinent to Current Situation/Hospitalization: No - Comment as needed  Activities of Daily Living Home Assistive Devices/Equipment: Walker (specify type), Eyeglasses ADL Screening (condition at time of admission) Patient's cognitive ability adequate to safely complete daily activities?: Yes Is the patient deaf or have difficulty hearing?: No Does the patient have difficulty seeing, even when wearing glasses/contacts?: No Does the patient have difficulty concentrating, remembering, or making decisions?: No Patient able to express need for assistance with ADLs?: Yes Does the patient have difficulty dressing or bathing?: No Independently performs ADLs?: Yes (appropriate for developmental age) Does the patient have difficulty walking or climbing stairs?: Yes Weakness of Legs: Both Weakness of Arms/Hands: None  Permission Sought/Granted Permission sought to share information with : Case Manager, Family Supports, Customer service manager Permission granted to share information with : Yes, Verbal Permission Granted  Share Information with NAME: Nonie Hoyer  Permission granted to share info w AGENCY: SNF's  Permission granted to share info w Relationship: daughter  Permission granted to share info w Contact Information: [phone]  Emotional Assessment Appearance:: Appears stated age Attitude/Demeanor/Rapport: Engaged Affect (typically observed): Accepting Orientation: : Oriented to Self, Oriented to Place, Oriented to  Time, Oriented to Situation Alcohol / Substance Use: Not Applicable Psych Involvement: No (comment)  Admission diagnosis:  Closed displaced intertrochanteric fracture of left femur, initial encounter ([place]) [S72.142A] 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 [S72.142K] Patient Active Problem List   Diagnosis Date Noted   Postoperative atrial fibrillation  w/ RVR and associated hypotension requiring vasopressor support in ICU 10/26/2021   Malnutrition of moderate degree 10/26/2021   Hypotension after procedure    Closed displaced intertrochanteric fracture of left femur ([place]) 68/59/9234   Diastolic CHF, chronic (HCC) 10/25/2021   COPD (chronic obstructive pulmonary disease) ([place]) 10/25/2021   Atrial fibrillation, chronic (HCC) 10/25/2021   Mild aortic valve stenosis 11/05/2019   Acute respiratory failure with hypoxia ([place]) 05/16/2019   CAP (community acquired pneumonia) 05/16/2019   Acute on chronic systolic CHF (congestive heart failure) ([place]) 05/16/2019   HLD (hyperlipidemia) 05/16/2019   Depression 05/16/2019   Lower limb ulcer, calf, left, limited to breakdown of skin ([place]) 05/06/2019   Swelling of limb 05/06/2019   CAD (coronary artery disease) 08/07/2018   Erythema of lower extremity 08/07/2018   Mild protein-calorie malnutrition ([place]) 06/18/2018   Acute respiratory distress 06/12/2018   Dyspnea 06/12/2018   Altered mental status 08/19/2017   Headache 08/19/2017   Tinnitus 08/19/2017   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   Unsteadiness 04/12/2017   Lung nodule 03/11/2017   Nausea 12/18/2016   Diabetic foot ulcer ([place]) 11/13/2016   Leg pain 05/07/2016   Acute on chronic combined systolic and diastolic heart failure ([place]) 05/06/2016   Atrial fibrillation with rapid ventricular response ([place]) 05/06/2016   High risk medication use 11/23/2015   Dysphagia 10/02/2015   Loss of memory 04/29/2015   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   Venous insufficiency of both lower extremities 12/17/2014   Bilateral lower extremity edema 11/23/2014   Nocturia 10/17/2014   History of urinary retention 10/17/2014   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   B12 deficiency 09/30/2014   Right leg swelling 09/30/2014   Anemia 09/16/2014   Thrombocytopenia ([place]) 09/16/2014   Benign essential HTN 06/08/2014    Combined fat and carbohydrate induced hyperlipemia 06/08/2014   Beat, premature ventricular 12/03/2013   Heart valve disease 12/03/2013   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   Ventricular premature depolarization 12/03/2013   Benign fibroma of prostate 11/20/2013   Diabetes mellitus, type 2 ([place]) 11/20/2013   Acid reflux 11/20/2013   Anemia, iron deficiency 11/20/2013   Abnormal presence of protein in urine 11/20/2013   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

## 2021-10-28 NOTE — Progress Notes (Addendum)
[place] NOTE       Patient ID: Jeremy Herrera MRN: [id number] DOB/AGE: [birth year] 82 y.o.  Admit date: 10/25/2021 Referring Physician Dr. Emeterio Reeve  Primary Physician Dr. Ellison Hughs Primary Cardiologist Dr. Nehemiah Massed Reason for Consultation AF RVR  HPI: Jeremy Herrera is an 81yoM with a PMH of chronic atrial fibrillation s/p [place] 2020, mild AS, HFpEF (EF 50%, G1 DD 05/2020), hypertension, hyperlipidemia, type 2 diabetes who presented to [facility] ED 10/25/2021 after mechanical fall.  Left hip x-ray showed a comminuted displaced and impacted left hip intertrochanteric fracture.  He underwent left intramedullary nailing with orthopedic surgery on 9/5, became hypotensive and tachycardic postoperatively requiring vasopressor support in the ICU.  He remains in atrial fibrillation with RVR for which cardiology is consulted.  Interval History:  - remains in atrial fibrillation with HR mostly low 100s at rest, paroxysms from 110s-140s while awake. Off vasopressors entirely. S/p 1unit PRBCs 9/7 - feels short of breath, no chest pain. No leg pain after oxycodone this morning.   Review of systems complete and found to be negative unless listed above     Past Medical History:  Diagnosis Date   Anemia    BPH (benign prostatic hyperplasia)    CHF (congestive heart failure) (HCC)    Diabetes mellitus without complication (HCC)    Dysphagia    GERD (gastroesophageal reflux disease)    HLD (hyperlipidemia)    HTN (hypertension)    Irregular heart beat    Nocturia    Urinary retention     Past Surgical History:  Procedure Laterality Date   AMPUTATION TOE Left 05/04/2017   Procedure: AMPUTATION TOE - third left toe;  Surgeon: Samara Deist, DPM;  Location: [facility] ORS;  Service: Podiatry;  Laterality: Left;   bladder patching  1964   HIP SURGERY Right 2014   INTRAMEDULLARY (IM) NAIL INTERTROCHANTERIC Left 10/25/2021   Procedure: INTRAMEDULLARY (IM) NAIL  INTERTROCHANTERIC;  Surgeon: Leim Fabry, MD;  Location: [facility] ORS;  Service: Orthopedics;  Laterality: Left;   IRRIGATION AND DEBRIDEMENT FOOT Left 11/14/2016   Procedure: IRRIGATION AND DEBRIDEMENT FOOT;  Surgeon: Samara Deist, DPM;  Location: [facility] ORS;  Service: Podiatry;  Laterality: Left;   TRANSURETHRAL RESECTION OF PROSTATE      Medications Prior to Admission  Medication Sig Dispense Refill Last Dose   albuterol (VENTOLIN HFA) 108 (90 Base) MCG/ACT inhaler Inhale 1-2 puffs into the lungs every 4 (four) hours as needed.    prn at prn   aspirin 81 MG tablet Take 81 mg by mouth daily.   10/24/2021   esomeprazole (NEXIUM) 20 MG capsule Take 20 mg by mouth daily at 12 noon.   10/24/2021   ferrous sulfate 325 (65 FE) MG tablet Take 325 mg by mouth daily.   10/24/2021   Fluticasone-Salmeterol (ADVAIR) 250-50 MCG/DOSE AEPB Inhale 1 puff into the lungs as needed.    prn at prn   furosemide (LASIX) 40 MG tablet Take 40 mg by mouth daily.   10/24/2021   lisinopril (ZESTRIL) 2.5 MG tablet Take 2.5 mg by mouth daily.    10/24/2021   metoprolol succinate (TOPROL-XL) 50 MG 24 hr tablet Take 150 mg by mouth daily.    10/24/2021   potassium chloride (K-DUR) 10 MEQ tablet Take 10 mEq by mouth daily.    10/24/2021   rosuvastatin (CRESTOR) 10 MG tablet Take 1 tablet by mouth daily.   10/24/2021   sertraline (ZOLOFT) 25 MG tablet Take 25 mg by mouth daily.  10/24/2021   vitamin B-12 (CYANOCOBALAMIN) 1000 MCG tablet Take 1 tablet (1,000 mcg total) by mouth daily. 90 tablet 1 10/24/2021   dorzolamide-timolol (COSOPT) 22.3-6.8 MG/ML ophthalmic solution Place 1 drop into both eyes 2 (two) times daily.  (Patient not taking: Reported on 10/27/2021)   Not Taking   ketoconazole (NIZORAL) 2 % cream Apply 1 application topically daily as needed.  (Patient not taking: Reported on 10/27/2021)   Not Taking   nitroGLYCERIN (NITROSTAT) 0.4 MG SL tablet Place 0.4 mg under the tongue every 5 (five) minutes as needed.    prn at prn   simethicone  (MYLICON) 0000000 MG chewable tablet Chew 125 mg by mouth every 6 (six) hours as needed.    prn at prn   Skin Protectants, Misc. (REMEDY PHYTOPLEX HYDRAGUARD) CREA Apply topically.      umeclidinium-vilanterol (ANORO ELLIPTA) 62.5-25 MCG/INH AEPB Inhale 1 puff into the lungs daily as needed. Takes daily   prn at prn   Social History   Socioeconomic History   Marital status: Widowed    Spouse name: Not on file   Number of children: Not on file   Years of education: Not on file   Highest education level: Not on file  Occupational History   Not on file  Tobacco Use   Smoking status: Never   Smokeless tobacco: Never  Vaping Use   Vaping Use: Never used  Substance and Sexual Activity   Alcohol use: No   Drug use: No   Sexual activity: Not Currently  Other Topics Concern   Not on file  Social History Narrative   Not on file   Social Determinants of Health   Financial Resource Strain: Not on file  Food Insecurity: Not on file  Transportation Needs: Not on file  Physical Activity: Not on file  Stress: Not on file  Social Connections: Not on file  Intimate Partner Violence: Not on file    Family History  Problem Relation Age of Onset   Ulcers Mother        stomach   GER disease Sister        GERD   Heart attack Father    Diabetes Brother    Depression Brother    Kidney disease Neg Hx    Prostate cancer Neg Hx    Kidney cancer Neg Hx    Bladder Cancer Neg Hx       PHYSICAL EXAM General: Elderly Caucasian male, well nourished, in no acute distress.  Sitting upright in ICU bed. HEENT:  Normocephalic and atraumatic. Neck:  No JVD.  Lungs: Mildly short of breath appearing on oxygen by [place].  Trace bibasilar crackles. Heart: Tachycardic irregularly irregular rhythm. Normal S1 and S2 without gallops or murmurs.  Abdomen: Non-distended appearing.  Msk: Normal strength and tone for age. Extremities: Warm and well perfused. No clubbing, cyanosis.  SCDs in place, without  peripheral edema.  Neuro: Alert and oriented X 3. Psych:  Answers questions appropriately.   Labs: Basic Metabolic Panel: Recent Labs    10/26/21 0419 10/27/21 0606 10/27/21 1504 10/28/21 0402  NA 144 139  --  139  K 4.8 4.3  --  3.9  CL 117* 113*  --  116*  CO2 23 22  --  22  GLUCOSE 148* 133*  --  122*  BUN 22 19  --  19  CREATININE 0.97 1.12  --  0.98  CALCIUM 8.1* 7.9*  --  8.0*  MG 1.7  --  2.0  2.0  PHOS 3.2  --   --   --     Liver Function Tests: Recent Labs    10/25/21 2324  ALBUMIN 2.8*    No results for input(s): "LIPASE", "AMYLASE" in the last 72 hours. CBC: Recent Labs    10/25/21 1125 10/25/21 2014 10/27/21 0606 10/27/21 2125 10/28/21 0402  WBC 7.3   < > 6.0  --  6.4  NEUTROABS 5.1  --   --   --   --   HGB 13.1   < > 7.5* 8.2* 8.3*  HCT 39.7   < > 22.4* 24.5* 24.7*  MCV 92.1   < > 92.6  --  91.1  PLT 154   < > 105*  --  89*   < > = values in this interval not displayed.    Cardiac Enzymes: No results for input(s): "CKTOTAL", "CKMB", "CKMBINDEX", "TROPONINIHS" in the last 72 hours. BNP: Invalid input(s): "POCBNP" D-Dimer: No results for input(s): "DDIMER" in the last 72 hours. Hemoglobin A1C: No results for input(s): "HGBA1C" in the last 72 hours. Fasting Lipid Panel: No results for input(s): "CHOL", "HDL", "LDLCALC", "TRIG", "CHOLHDL", "LDLDIRECT" in the last 72 hours. Thyroid Function Tests: No results for input(s): "TSH", "T4TOTAL", "T3FREE", "THYROIDAB" in the last 72 hours.  Invalid input(s): "FREET3" Anemia Panel: No results for input(s): "VITAMINB12", "FOLATE", "FERRITIN", "TIBC", "IRON", "RETICCTPCT" in the last 72 hours.  DG Chest Port 1 View  Result Date: 10/28/2021 CLINICAL DATA:  Shortness of breath, chest pain. EXAM: PORTABLE CHEST 1 VIEW COMPARISON:  October 25, 2021. FINDINGS: Stable cardiomegaly. Increased coarse reticular opacities are noted throughout both lungs concerning for edema or atypical inflammation. Elevated  right hemidiaphragm is noted. Bony thorax is unremarkable. IMPRESSION: Increased bilateral lung opacities are noted concerning for edema or atypical inflammation. Electronically Signed   By: Marijo Conception M.D.   On: 10/28/2021 08:31   CT ABDOMEN PELVIS WO CONTRAST  Result Date: 10/27/2021 CLINICAL DATA:  Retroperitoneal bleed suspected eval for retroperitoneal bleed EXAM: CT ABDOMEN AND PELVIS WITHOUT CONTRAST TECHNIQUE: Multidetector CT imaging of the abdomen and pelvis was performed following the standard protocol without IV contrast. RADIATION DOSE REDUCTION: This exam was performed according to the departmental dose-optimization program which includes automated exposure control, adjustment of the mA and/or kV according to patient size and/or use of iterative reconstruction technique. COMPARISON:  CT abdomen pelvis 12/10/2011 trauma x-ray left hip 10/25/2021 FINDINGS: Lower chest: Peribronchovascular consolidation within the visualized lung bases. Peripheral reticulations noted. Left heart Amplatzer device noted. Aortic vascular calcifications. At least 3 vessel coronary artery calcification. Cardiac findings suggestive of anemia trace left pleural effusion. Hepatobiliary: No focal liver abnormality. No gallstones, gallbladder wall thickening, or pericholecystic fluid. No biliary dilatation. Pancreas: Diffusely atrophic. No focal lesion. Otherwise normal pancreatic contour. No surrounding inflammatory changes. No main pancreatic ductal dilatation. Spleen: Normal in size without focal abnormality. Adrenals/Urinary Tract: No adrenal nodule bilaterally. Bilateral kidneys enhance symmetrically. No hydronephrosis. No hydroureter. The urinary bladder is unremarkable. Stomach/Bowel: Stomach is within normal limits. No evidence of bowel wall thickening or dilatation. Colonic diverticulosis. The appendix is not definitely identified with no inflammatory changes in the right lower quadrant to suggest acute  appendicitis. Vascular/Lymphatic: No abdominal aorta or iliac aneurysm. Moderate to severe atherosclerotic plaque of the aorta and its branches. No abdominal, pelvic, or inguinal lymphadenopathy. Reproductive: Not visualized due to streak artifact originating from bilateral femoral surgical hardware. Other: No intraperitoneal free fluid. No intraperitoneal free gas. No organized fluid collection.  No retroperitoneal hematoma formation. Musculoskeletal: No abdominal wall hernia or abnormality. Subcutaneus soft tissue edema and emphysema along the left hip. Left hip 2.5 x 1.8 cm subcutaneus soft tissue hematoma formation. Overlying skin staples. Atrophic degeneration of the right iliopsoas muscle. No suspicious lytic or blastic osseous lesions. No acute displaced fracture. Multilevel degenerative changes of the spine. Total right hip arthroplasty partially visualized. Proximal femur fracture status post intramedullary fixation partially visualized. IMPRESSION: 1. No retroperitoneal hematoma. 2. Colonic diverticulosis with no acute diverticulitis. 3. Cardiac findings suggestive of anemia. 4. Peribronchovascular consolidation within the visualized lung bases suggestive of acute infection/inflammation. Peripheral reticulations likely representing underlying fibrosis. 5. Trace left pleural effusion. 6.  Aortic Atherosclerosis (ICD10-I70.0). Electronically Signed   By: Tish Frederickson M.D.   On: 10/27/2021 18:09   ECHOCARDIOGRAM COMPLETE  Result Date: 10/27/2021    ECHOCARDIOGRAM REPORT   Patient Name:   Jeremy Herrera Date of Exam: 10/27/2021 Medical Rec #:  [id number]         Height:       73.0 in Accession #:    [id number]        Weight:       215.6 lb Date of Birth:  24-Nov-1940          BSA:          2.221 m Patient Age:    81 years          BP:           71/61 mmHg Patient Gender: M                 HR:           117 bpm. Exam Location:  [facility] Procedure: 2D Echo, Cardiac Doppler and Color Doppler Indications:      Atrial Fibrillation I 48.91  History:         Patient has prior history of Echocardiogram examinations, most                  recent 05/17/2019. CHF; Risk Factors:Diabetes, Hypertension and                  Dyslipidemia.  Sonographer:     Cristela Blue Referring Phys:  [id number] Sunnie Nielsen Diagnosing Phys: Julien Nordmann MD  Sonographer Comments: Suboptimal apical window and Technically challenging study due to limited acoustic windows. IMPRESSIONS  1. Left ventricular ejection fraction, by estimation, is 60 to 65%. The left ventricle has normal function. The left ventricle has no regional wall motion abnormalities. Left ventricular diastolic parameters are indeterminate.  2. Right ventricular systolic function is normal. The right ventricular size is normal. There is normal pulmonary artery systolic pressure. The estimated right ventricular systolic pressure is 17.7 mmHg.  3. Left atrial size was severely dilated.  4. The mitral valve is normal in structure. No evidence of mitral valve regurgitation. No evidence of mitral stenosis.  5. The aortic valve was not well visualized. There is moderate calcification of the aortic valve. Aortic valve regurgitation is not visualized. Aortic valve sclerosis is present, with no evidence of aortic valve stenosis. Aortic valve area, by VTI measures 1.29 cm. Aortic valve mean gradient measures 6.3 mmHg. Aortic valve Vmax measures 1.69 m/s.  6. The inferior vena cava is normal in size with greater than 50% respiratory variability, suggesting right atrial pressure of 3 mmHg. FINDINGS  Left Ventricle: Left ventricular ejection fraction, by estimation, is 60 to 65%. The left ventricle has normal function. The left  ventricle has no regional wall motion abnormalities. The left ventricular internal cavity size was normal in size. There is  no left ventricular hypertrophy. Left ventricular diastolic parameters are indeterminate. Right Ventricle: The right ventricular size is normal. No  increase in right ventricular wall thickness. Right ventricular systolic function is normal. There is normal pulmonary artery systolic pressure. The tricuspid regurgitant velocity is 1.78 m/s, and  with an assumed right atrial pressure of 5 mmHg, the estimated right ventricular systolic pressure is 123456 mmHg. Left Atrium: Left atrial size was severely dilated. Right Atrium: Right atrial size was normal in size. Pericardium: There is no evidence of pericardial effusion. Mitral Valve: The mitral valve is normal in structure. No evidence of mitral valve regurgitation. No evidence of mitral valve stenosis. Tricuspid Valve: The tricuspid valve is normal in structure. Tricuspid valve regurgitation is mild . No evidence of tricuspid stenosis. Aortic Valve: The aortic valve was not well visualized. There is moderate calcification of the aortic valve. Aortic valve regurgitation is not visualized. Aortic valve sclerosis is present, with no evidence of aortic valve stenosis. Aortic valve mean gradient measures 6.3 mmHg. Aortic valve peak gradient measures 11.4 mmHg. Aortic valve area, by VTI measures 1.29 cm. Pulmonic Valve: The pulmonic valve was normal in structure. Pulmonic valve regurgitation is not visualized. No evidence of pulmonic stenosis. Aorta: The aortic root is normal in size and structure. Venous: The inferior vena cava is normal in size with greater than 50% respiratory variability, suggesting right atrial pressure of 3 mmHg. IAS/Shunts: No atrial level shunt detected by color flow Doppler.  LEFT VENTRICLE PLAX 2D LVIDd:         5.00 cm LVIDs:         4.15 cm LV PW:         1.40 cm LV IVS:        1.10 cm LVOT diam:     2.00 cm LV SV:         40 LV SV Index:   18 LVOT Area:     3.14 cm  RIGHT VENTRICLE RV Basal diam:  4.10 cm RV S prime:     11.10 cm/s TAPSE (M-mode): 2.7 cm LEFT ATRIUM            Index        RIGHT ATRIUM           Index LA diam:      5.50 cm  2.48 cm/m   RA Area:     21.90 cm LA Vol (A2C):  104.0 ml 46.82 ml/m  RA Volume:   70.70 ml  31.83 ml/m LA Vol (A4C): 184.0 ml 82.83 ml/m  AORTIC VALVE AV Area (Vmax):    1.15 cm AV Area (Vmean):   1.05 cm AV Area (VTI):     1.29 cm AV Vmax:           169.00 cm/s AV Vmean:          117.667 cm/s AV VTI:            0.307 m AV Peak Grad:      11.4 mmHg AV Mean Grad:      6.3 mmHg LVOT Vmax:         62.00 cm/s LVOT Vmean:        39.400 cm/s LVOT VTI:          0.126 m LVOT/AV VTI ratio: 0.41  AORTA Ao Root diam: 3.60 cm MITRAL VALVE  TRICUSPID VALVE MV Area (PHT): 4.63 cm     TR Peak grad:   12.7 mmHg MV Decel Time: 164 msec     TR Vmax:        178.00 cm/s MV E velocity: 112.00 cm/s                             SHUNTS                             Systemic VTI:  0.13 m                             Systemic Diam: 2.00 cm Ida Rogue MD Electronically signed by Ida Rogue MD Signature Date/Time: 10/27/2021/4:19:09 PM    Final      Radiology: Darletta Moll Chest Port 1 View  Result Date: 10/28/2021 CLINICAL DATA:  Shortness of breath, chest pain. EXAM: PORTABLE CHEST 1 VIEW COMPARISON:  October 25, 2021. FINDINGS: Stable cardiomegaly. Increased coarse reticular opacities are noted throughout both lungs concerning for edema or atypical inflammation. Elevated right hemidiaphragm is noted. Bony thorax is unremarkable. IMPRESSION: Increased bilateral lung opacities are noted concerning for edema or atypical inflammation. Electronically Signed   By: Marijo Conception M.D.   On: 10/28/2021 08:31   CT ABDOMEN PELVIS WO CONTRAST  Result Date: 10/27/2021 CLINICAL DATA:  Retroperitoneal bleed suspected eval for retroperitoneal bleed EXAM: CT ABDOMEN AND PELVIS WITHOUT CONTRAST TECHNIQUE: Multidetector CT imaging of the abdomen and pelvis was performed following the standard protocol without IV contrast. RADIATION DOSE REDUCTION: This exam was performed according to the departmental dose-optimization program which includes automated exposure control, adjustment of  the mA and/or kV according to patient size and/or use of iterative reconstruction technique. COMPARISON:  CT abdomen pelvis 12/10/2011 trauma x-ray left hip 10/25/2021 FINDINGS: Lower chest: Peribronchovascular consolidation within the visualized lung bases. Peripheral reticulations noted. Left heart Amplatzer device noted. Aortic vascular calcifications. At least 3 vessel coronary artery calcification. Cardiac findings suggestive of anemia trace left pleural effusion. Hepatobiliary: No focal liver abnormality. No gallstones, gallbladder wall thickening, or pericholecystic fluid. No biliary dilatation. Pancreas: Diffusely atrophic. No focal lesion. Otherwise normal pancreatic contour. No surrounding inflammatory changes. No main pancreatic ductal dilatation. Spleen: Normal in size without focal abnormality. Adrenals/Urinary Tract: No adrenal nodule bilaterally. Bilateral kidneys enhance symmetrically. No hydronephrosis. No hydroureter. The urinary bladder is unremarkable. Stomach/Bowel: Stomach is within normal limits. No evidence of bowel wall thickening or dilatation. Colonic diverticulosis. The appendix is not definitely identified with no inflammatory changes in the right lower quadrant to suggest acute appendicitis. Vascular/Lymphatic: No abdominal aorta or iliac aneurysm. Moderate to severe atherosclerotic plaque of the aorta and its branches. No abdominal, pelvic, or inguinal lymphadenopathy. Reproductive: Not visualized due to streak artifact originating from bilateral femoral surgical hardware. Other: No intraperitoneal free fluid. No intraperitoneal free gas. No organized fluid collection. No retroperitoneal hematoma formation. Musculoskeletal: No abdominal wall hernia or abnormality. Subcutaneus soft tissue edema and emphysema along the left hip. Left hip 2.5 x 1.8 cm subcutaneus soft tissue hematoma formation. Overlying skin staples. Atrophic degeneration of the right iliopsoas muscle. No suspicious  lytic or blastic osseous lesions. No acute displaced fracture. Multilevel degenerative changes of the spine. Total right hip arthroplasty partially visualized. Proximal femur fracture status post intramedullary fixation partially visualized. IMPRESSION: 1. No retroperitoneal hematoma. 2. Colonic diverticulosis  with no acute diverticulitis. 3. Cardiac findings suggestive of anemia. 4. Peribronchovascular consolidation within the visualized lung bases suggestive of acute infection/inflammation. Peripheral reticulations likely representing underlying fibrosis. 5. Trace left pleural effusion. 6.  Aortic Atherosclerosis (ICD10-I70.0). Electronically Signed   By: Iven Finn M.D.   On: 10/27/2021 18:09   ECHOCARDIOGRAM COMPLETE  Result Date: 10/27/2021    ECHOCARDIOGRAM REPORT   Patient Name:   Jeremy Herrera Date of Exam: 10/27/2021 Medical Rec #:  [id number]         Height:       73.0 in Accession #:    [id number]        Weight:       215.6 lb Date of Birth:  02-13-1941          BSA:          2.221 m Patient Age:    54 years          BP:           71/61 mmHg Patient Gender: M                 HR:           117 bpm. Exam Location:  [facility] Procedure: 2D Echo, Cardiac Doppler and Color Doppler Indications:     Atrial Fibrillation I 48.91  History:         Patient has prior history of Echocardiogram examinations, most                  recent 05/17/2019. CHF; Risk Factors:Diabetes, Hypertension and                  Dyslipidemia.  Sonographer:     Sherrie Sport Referring Phys:  [id number] Emeterio Reeve Diagnosing Phys: Ida Rogue MD  Sonographer Comments: Suboptimal apical window and Technically challenging study due to limited acoustic windows. IMPRESSIONS  1. Left ventricular ejection fraction, by estimation, is 60 to 65%. The left ventricle has normal function. The left ventricle has no regional wall motion abnormalities. Left ventricular diastolic parameters are indeterminate.  2. Right ventricular systolic function  is normal. The right ventricular size is normal. There is normal pulmonary artery systolic pressure. The estimated right ventricular systolic pressure is 123456 mmHg.  3. Left atrial size was severely dilated.  4. The mitral valve is normal in structure. No evidence of mitral valve regurgitation. No evidence of mitral stenosis.  5. The aortic valve was not well visualized. There is moderate calcification of the aortic valve. Aortic valve regurgitation is not visualized. Aortic valve sclerosis is present, with no evidence of aortic valve stenosis. Aortic valve area, by VTI measures 1.29 cm. Aortic valve mean gradient measures 6.3 mmHg. Aortic valve Vmax measures 1.69 m/s.  6. The inferior vena cava is normal in size with greater than 50% respiratory variability, suggesting right atrial pressure of 3 mmHg. FINDINGS  Left Ventricle: Left ventricular ejection fraction, by estimation, is 60 to 65%. The left ventricle has normal function. The left ventricle has no regional wall motion abnormalities. The left ventricular internal cavity size was normal in size. There is  no left ventricular hypertrophy. Left ventricular diastolic parameters are indeterminate. Right Ventricle: The right ventricular size is normal. No increase in right ventricular wall thickness. Right ventricular systolic function is normal. There is normal pulmonary artery systolic pressure. The tricuspid regurgitant velocity is 1.78 m/s, and  with an assumed right atrial pressure of 5 mmHg, the estimated right ventricular systolic pressure  is 17.7 mmHg. Left Atrium: Left atrial size was severely dilated. Right Atrium: Right atrial size was normal in size. Pericardium: There is no evidence of pericardial effusion. Mitral Valve: The mitral valve is normal in structure. No evidence of mitral valve regurgitation. No evidence of mitral valve stenosis. Tricuspid Valve: The tricuspid valve is normal in structure. Tricuspid valve regurgitation is mild . No  evidence of tricuspid stenosis. Aortic Valve: The aortic valve was not well visualized. There is moderate calcification of the aortic valve. Aortic valve regurgitation is not visualized. Aortic valve sclerosis is present, with no evidence of aortic valve stenosis. Aortic valve mean gradient measures 6.3 mmHg. Aortic valve peak gradient measures 11.4 mmHg. Aortic valve area, by VTI measures 1.29 cm. Pulmonic Valve: The pulmonic valve was normal in structure. Pulmonic valve regurgitation is not visualized. No evidence of pulmonic stenosis. Aorta: The aortic root is normal in size and structure. Venous: The inferior vena cava is normal in size with greater than 50% respiratory variability, suggesting right atrial pressure of 3 mmHg. IAS/Shunts: No atrial level shunt detected by color flow Doppler.  LEFT VENTRICLE PLAX 2D LVIDd:         5.00 cm LVIDs:         4.15 cm LV PW:         1.40 cm LV IVS:        1.10 cm LVOT diam:     2.00 cm LV SV:         40 LV SV Index:   18 LVOT Area:     3.14 cm  RIGHT VENTRICLE RV Basal diam:  4.10 cm RV S prime:     11.10 cm/s TAPSE (M-mode): 2.7 cm LEFT ATRIUM            Index        RIGHT ATRIUM           Index LA diam:      5.50 cm  2.48 cm/m   RA Area:     21.90 cm LA Vol (A2C): 104.0 ml 46.82 ml/m  RA Volume:   70.70 ml  31.83 ml/m LA Vol (A4C): 184.0 ml 82.83 ml/m  AORTIC VALVE AV Area (Vmax):    1.15 cm AV Area (Vmean):   1.05 cm AV Area (VTI):     1.29 cm AV Vmax:           169.00 cm/s AV Vmean:          117.667 cm/s AV VTI:            0.307 m AV Peak Grad:      11.4 mmHg AV Mean Grad:      6.3 mmHg LVOT Vmax:         62.00 cm/s LVOT Vmean:        39.400 cm/s LVOT VTI:          0.126 m LVOT/AV VTI ratio: 0.41  AORTA Ao Root diam: 3.60 cm MITRAL VALVE                TRICUSPID VALVE MV Area (PHT): 4.63 cm     TR Peak grad:   12.7 mmHg MV Decel Time: 164 msec     TR Vmax:        178.00 cm/s MV E velocity: 112.00 cm/s                             SHUNTS  Systemic VTI:  0.13 m                             Systemic Diam: 2.00 cm Ida Rogue MD Electronically signed by Ida Rogue MD Signature Date/Time: 10/27/2021/4:19:09 PM    Final    DG HIP UNILAT WITH PELVIS 2-3 VIEWS LEFT  Result Date: 10/25/2021 CLINICAL DATA:  ORIF LEFT femur EXAM: DG HIP (WITH OR WITHOUT PELVIS) 2-3V LEFT COMPARISON:  10/25/2021 Fluoroscopy time: 3 minutes 47 seconds Dose: Not provided; please refer to operative records Images: 6 FINDINGS: Osseous demineralization. IM nail with 2 compression screws placed across an intertrochanteric fracture LEFT femur. Significant improvement in fracture fragment alignment since previous exam. Distal locking screw present. IMPRESSION: Post nailing of intertrochanteric fracture LEFT femur. Electronically Signed   By: Lavonia Dana M.D.   On: 10/25/2021 18:42   DG C-Arm 1-60 Min-No Report  Result Date: 10/25/2021 Fluoroscopy was utilized by the requesting physician.  No radiographic interpretation.   CT CHEST WO CONTRAST  Result Date: 10/25/2021 CLINICAL DATA:  Fall.  Pleural effusion. EXAM: CT CHEST WITHOUT CONTRAST TECHNIQUE: Multidetector CT imaging of the chest was performed following the standard protocol without IV contrast. RADIATION DOSE REDUCTION: This exam was performed according to the departmental dose-optimization program which includes automated exposure control, adjustment of the mA and/or kV according to patient size and/or use of iterative reconstruction technique. COMPARISON:  Radiograph of same day.  CT scan of Jul 04, 2017. FINDINGS: Cardiovascular: Atherosclerosis of thoracic aorta is noted without aneurysm formation. Aortic valves calcifications are noted. Normal cardiac size. No pericardial effusion. Mild coronary artery calcifications are noted. Mediastinum/Nodes: No enlarged mediastinal or axillary lymph nodes. Thyroid gland, trachea, and esophagus demonstrate no significant findings. Lungs/Pleura: No pneumothorax or  pleural effusion is noted. Reticular densities are noted peripherally in both lungs suggesting chronic interstitial lung disease. Interval development of 6 mm nodule seen in left lower lobe best seen on image number 121 of series 3. Multiple patchy airspace opacities are noted in the right lung which may represent multifocal inflammation, or potentially severe fibrosis secondary to chronic scarring or inflammation. Some degree of mosaic pattern is seen in the left lung suggesting multifocal inflammation or air trapping secondary to small airways disease. Some degree of bronchiectasis is noted in both lower lobes. Upper Abdomen: No acute abnormality. Musculoskeletal: No chest wall mass or suspicious bone lesions identified. IMPRESSION: There is interval development of multiple patchy airspace opacities in the right lung which may represent multifocal inflammation or pneumonia, or potentially severe masslike fibrosis secondary to chronic scarring or inflammation. Some degree of mosaic pattern is also noted in the left lung suggesting multifocal inflammation or air trapping secondary to small airways disease. There are again noted reticular densities noted peripherally throughout both lungs suggesting chronic interstitial lung disease, as well as some degree of bronchiectasis seen in both lower lobes, most consistent with chronic findings. Interval development of small nodule seen in left lower lobe. Non-contrast chest CT at 6-12 months is recommended. If the nodule is stable at time of repeat CT, then future CT at 18-24 months (from today's scan) is considered optional for low-risk patients, but is recommended for high-risk patients. This recommendation follows the consensus statement: Guidelines for Management of Incidental Pulmonary Nodules Detected on CT Images: From the Fleischner Society 2017; Radiology 2017; 284:228-243. Mild coronary artery calcifications are noted. Aortic valve calcifications are noted. Aortic  Atherosclerosis (ICD10-I70.0). Electronically Signed  By: Marijo Conception M.D.   On: 10/25/2021 14:56   DG Chest 1 View  Result Date: 10/25/2021 CLINICAL DATA:  Fall EXAM: CHEST  1 VIEW COMPARISON:  Radiograph 05/16/2019 FINDINGS: Enlarged cardiac silhouette. Appendage occlusion device noted. There is pulmonary vascular congestion with interstitial prominent bilaterally, left greater than right. No pleural effusion. No pneumothorax. No acute osseous abnormality identified on frontal radiographs of the chest. IMPRESSION: Cardiomegaly with pulmonary vascular congestion and interstitial prominence bilaterally, left greater than right, which could reflect a degree of interstitial edema. Electronically Signed   By: Maurine Simmering M.D.   On: 10/25/2021 11:57   DG Hip Unilat With Pelvis 2-3 Views Left  Result Date: 10/25/2021 CLINICAL DATA:  Fall, pain EXAM: DG HIP (WITH OR WITHOUT PELVIS) 2-3V LEFT COMPARISON:  None Available. FINDINGS: Osteopenia. Comminuted, displaced and impacted intratrochanteric fractures of the left hip. Status post right hip total arthroplasty. No displaced fracture or dislocation of the bony pelvis. Nonobstructive pattern of overlying bowel gas. IMPRESSION: 1. Comminuted, displaced and impacted intratrochanteric fractures of the left hip. 2. Status post right hip total arthroplasty. 3. No displaced fracture or dislocation of the bony pelvis. Electronically Signed   By: Delanna Ahmadi M.D.   On: 10/25/2021 11:52    ECHO EF 50%, G1 DD 05/2020  TELEMETRY reviewed by me (LT) 10/28/2021 : Atrial fibrillation with variable heart rate 100s to paroxysms in the 120s to 130s  EKG reviewed by me: Atrial fibrillation rate 125 with significant artifact throughout.  Data reviewed by me (LT) 10/28/2021: hospitalist progress note, orthopedic surgery progress note, I/Os, CBC, BMP,  telemetry, vitals ,cxr  ASSESSMENT AND PLAN:  Jeremy Herrera is an 59yoM with a PMH of chronic atrial fibrillation s/p  Watchman 2020, mild AS, HFpEF (EF 50%, G1 DD 05/2020), hypertension, hyperlipidemia, type 2 diabetes who presented to [facility] ED 10/25/2021 after mechanical fall.  Left hip x-ray showed a comminuted displaced and impacted left hip intertrochanteric fracture.  He underwent left intramedullary nailing with orthopedic surgery on 9/5, became hypotensive and tachycardic postoperatively requiring vasopressor support in the ICU.  He remains in atrial fibrillation with RVR for which cardiology is consulted.  #Left hip fracture s/p IM nailing 9/5 #Chronic atrial fibrillation with RVR s/p Watchman 2020 #Postoperative anemia Presented after a mechanical fall, underwent surgery on 9/5, became hypotensive and tachycardic postoperatively suspected due to hypovolemia versus sedation, responding to IV fluids and phenylephrine.  He has had intermittent tachycardia with heart rates in the 120s to 130s with conversation, although is in the 90s to 100s at rest.  Notably, his hemoglobin has declined from 11.1 down to 7.5 over the past 2 days.  He had some chest pressure, chills, and right-sided neck discomfort, somewhat relieved with nitroglycerin and repeat EKG during that episode had significant artifact but was not obviously ischemic appearing. On 9/8 feels somewhat short of breath.  -Recommend continuing to treat causes of increased adrenergic tone including pain, anemia -off phenylephrine entirely since evening 9/7 -s/o 1 unit PRBCs 9/7 -repeat chest x-ray shows increased pulmonary edema.  -stopping IVF today. Will give IV lasix 40mg  x2, monitor response and consider repeat dosing  -increase metoprolol tartrate 12.5 mg to three times daily.  -Not on anticoagulation due to history of Watchman device 2020 -Echocardiogram complete performed today, read pending  #Chronic HFpEF -Has received significant IV fluid resuscitation (+5 L) and is somewhat short of breath with trace bibasilar crackles.  -stop IVF and give lasix as  above.  This patient's plan of care was discussed and created with Dr. Nehemiah Massed and he is in agreement.  Signed: Tristan Schroeder , PA-C 10/28/2021, 8:37 AM [facility]

## 2021-10-29 DIAGNOSIS — F325 Major depressive disorder, single episode, in full remission: Secondary | ICD-10-CM | POA: Diagnosis not present

## 2021-10-29 DIAGNOSIS — J449 Chronic obstructive pulmonary disease, unspecified: Secondary | ICD-10-CM | POA: Diagnosis not present

## 2021-10-29 DIAGNOSIS — I482 Chronic atrial fibrillation, unspecified: Secondary | ICD-10-CM | POA: Diagnosis not present

## 2021-10-29 DIAGNOSIS — S72142A Displaced intertrochanteric fracture of left femur, initial encounter for closed fracture: Secondary | ICD-10-CM | POA: Diagnosis not present

## 2021-10-29 LAB — BASIC METABOLIC PANEL
Anion gap: 7 (ref 5–15)
BUN: 21 mg/dL (ref 8–23)
CO2: 24 mmol/L (ref 22–32)
Calcium: 8.3 mg/dL — ABNORMAL LOW (ref 8.9–10.3)
Chloride: 109 mmol/L (ref 98–111)
Creatinine, Ser: 1.13 mg/dL (ref 0.61–1.24)
GFR, Estimated: >60 mL/min (ref >60)
Glucose, Bld: 123 mg/dL — ABNORMAL HIGH (ref 70–99)
Potassium: 4 mmol/L (ref 3.5–5.1)
Sodium: 140 mmol/L (ref 135–145)

## 2021-10-29 LAB — CBC
HCT: 24.7 % — ABNORMAL LOW (ref 39.0–52.0)
Hemoglobin: 8.3 g/dL — ABNORMAL LOW (ref 13.0–17.0)
MCH: 30.2 pg (ref 26.0–34.0)
MCHC: 33.6 g/dL (ref 30.0–36.0)
MCV: 89.8 fL (ref 80.0–100.0)
Platelets: 131 10*3/uL — ABNORMAL LOW (ref 150–400)
RBC: 2.75 MIL/uL — ABNORMAL LOW (ref 4.22–5.81)
RDW: 15.3 % (ref 11.5–15.5)
WBC: 8.3 10*3/uL (ref 4.0–10.5)
nRBC: 0 % (ref 0.0–0.2)

## 2021-10-29 MED ORDER — MIDODRINE HCL 5 MG PO TABS
5.0000 mg | ORAL_TABLET | Freq: Once | ORAL | Status: AC
Start: 2021-10-29 — End: 2021-10-29
  Administered 2021-10-29: 5 mg via ORAL
  Filled 2021-10-29: qty 1

## 2021-10-29 MED ORDER — MIDODRINE HCL 5 MG PO TABS
5.0000 mg | ORAL_TABLET | Freq: Three times a day (TID) | ORAL | Status: DC
  Administered 2021-10-29 – 2021-10-30 (×2): 5 mg via ORAL
  Filled 2021-10-29 (×2): qty 1

## 2021-10-29 NOTE — Progress Notes (Signed)
[facility] CLINIC CARDIOLOGY CONSULT NOTE       Patient ID: Jeremy Herrera MRN: [id number] DOB/AGE: [birth year] 81 y.o.  Admit date: 10/25/2021 Referring Physician Dr. Sunnie Nielsen  Primary Physician Dr. Maryjane Hurter Primary Cardiologist Dr. Gwen Pounds Reason for Consultation AF RVR  HPI: Jeremy Herrera is an 81yoM with a PMH of chronic atrial fibrillation s/p Watchman 2020, mild AS, HFpEF (EF 50%, G1 DD 05/2020), hypertension, hyperlipidemia, type 2 diabetes who presented to [facility] ED 10/25/2021 after mechanical fall.  Left hip x-ray showed a comminuted displaced and impacted left hip intertrochanteric fracture.  He underwent left intramedullary nailing with orthopedic surgery on 9/5, became hypotensive and tachycardic postoperatively requiring vasopressor support in the ICU.  He remains in atrial fibrillation with RVR for which cardiology is consulted.  Interval History:  - remains in atrial fibrillation with HR mostly low 100s at rest, paroxysms from 110s-  while awake.  Overall the patient has improved with packed red blood cells and other treatments of primary concerns. Review of systems complete and found to be negative unless listed above     Past Medical History:  Diagnosis Date   Anemia    BPH (benign prostatic hyperplasia)    CHF (congestive heart failure) (HCC)    Diabetes mellitus without complication (HCC)    Dysphagia    GERD (gastroesophageal reflux disease)    HLD (hyperlipidemia)    HTN (hypertension)    Irregular heart beat    Nocturia    Urinary retention     Past Surgical History:  Procedure Laterality Date   AMPUTATION TOE Left 05/04/2017   Procedure: AMPUTATION TOE - third left toe;  Surgeon: Gwyneth Revels, DPM;  Location: [facility] ORS;  Service: Podiatry;  Laterality: Left;   bladder patching  1964   HIP SURGERY Right 2014   INTRAMEDULLARY (IM) NAIL INTERTROCHANTERIC Left 10/25/2021   Procedure: INTRAMEDULLARY (IM) NAIL INTERTROCHANTERIC;  Surgeon: Signa Kell, MD;  Location: [facility] ORS;  Service: Orthopedics;  Laterality: Left;   IRRIGATION AND DEBRIDEMENT FOOT Left 11/14/2016   Procedure: IRRIGATION AND DEBRIDEMENT FOOT;  Surgeon: Gwyneth Revels, DPM;  Location: [facility] ORS;  Service: Podiatry;  Laterality: Left;   TRANSURETHRAL RESECTION OF PROSTATE      Medications Prior to Admission  Medication Sig Dispense Refill Last Dose   albuterol (VENTOLIN HFA) 108 (90 Base) MCG/ACT inhaler Inhale 1-2 puffs into the lungs every 4 (four) hours as needed.    prn at prn   aspirin 81 MG tablet Take 81 mg by mouth daily.   10/24/2021   esomeprazole (NEXIUM) 20 MG capsule Take 20 mg by mouth daily at 12 noon.   10/24/2021   ferrous sulfate 325 (65 FE) MG tablet Take 325 mg by mouth daily.   10/24/2021   Fluticasone-Salmeterol (ADVAIR) 250-50 MCG/DOSE AEPB Inhale 1 puff into the lungs as needed.    prn at prn   furosemide (LASIX) 40 MG tablet Take 40 mg by mouth daily.   10/24/2021   lisinopril (ZESTRIL) 2.5 MG tablet Take 2.5 mg by mouth daily.    10/24/2021   metoprolol succinate (TOPROL-XL) 50 MG 24 hr tablet Take 150 mg by mouth daily.    10/24/2021   potassium chloride (K-DUR) 10 MEQ tablet Take 10 mEq by mouth daily.    10/24/2021   rosuvastatin (CRESTOR) 10 MG tablet Take 1 tablet by mouth daily.   10/24/2021   sertraline (ZOLOFT) 25 MG tablet Take 25 mg by mouth daily.    10/24/2021  vitamin B-12 (CYANOCOBALAMIN) 1000 MCG tablet Take 1 tablet (1,000 mcg total) by mouth daily. 90 tablet 1 10/24/2021   dorzolamide-timolol (COSOPT) 22.3-6.8 MG/ML ophthalmic solution Place 1 drop into both eyes 2 (two) times daily.  (Patient not taking: Reported on 10/27/2021)   Not Taking   ketoconazole (NIZORAL) 2 % cream Apply 1 application topically daily as needed.  (Patient not taking: Reported on 10/27/2021)   Not Taking   nitroGLYCERIN (NITROSTAT) 0.4 MG SL tablet Place 0.4 mg under the tongue every 5 (five) minutes as needed.    prn at prn   simethicone (MYLICON) 0000000 MG chewable tablet  Chew 125 mg by mouth every 6 (six) hours as needed.    prn at prn   Skin Protectants, Misc. (REMEDY PHYTOPLEX HYDRAGUARD) CREA Apply topically.      umeclidinium-vilanterol (ANORO ELLIPTA) 62.5-25 MCG/INH AEPB Inhale 1 puff into the lungs daily as needed. Takes daily   prn at prn   Social History   Socioeconomic History   Marital status: Widowed    Spouse name: Not on file   Number of children: Not on file   Years of education: Not on file   Highest education level: Not on file  Occupational History   Not on file  Tobacco Use   Smoking status: Never   Smokeless tobacco: Never  Vaping Use   Vaping Use: Never used  Substance and Sexual Activity   Alcohol use: No   Drug use: No   Sexual activity: Not Currently  Other Topics Concern   Not on file  Social History Narrative   Not on file   Social Determinants of Health   Financial Resource Strain: Not on file  Food Insecurity: Not on file  Transportation Needs: Not on file  Physical Activity: Not on file  Stress: Not on file  Social Connections: Not on file  Intimate Partner Violence: Not on file    Family History  Problem Relation Age of Onset   Ulcers Mother        stomach   GER disease Sister        GERD   Heart attack Father    Diabetes Brother    Depression Brother    Kidney disease Neg Hx    Prostate cancer Neg Hx    Kidney cancer Neg Hx    Bladder Cancer Neg Hx       PHYSICAL EXAM General: Elderly Caucasian male, well nourished, in no acute distress.  Sitting upright in ICU bed. HEENT:  Normocephalic and atraumatic. Neck:  No JVD.  Lungs: Mildly short of breath appearing on oxygen by [place].  Trace bibasilar crackles. Heart: Tachycardic irregularly irregular rhythm. Normal S1 and S2 without gallops or murmurs.  Abdomen: Non-distended appearing.  Msk: Normal strength and tone for age. Extremities: Warm and well perfused. No clubbing, cyanosis.  SCDs in place, without peripheral edema.  Neuro: Alert and  oriented X 3. Psych:  Answers questions appropriately.   Labs: Basic Metabolic Panel: Recent Labs    10/27/21 1504 10/28/21 0402 10/29/21 0415  NA  --  139 140  K  --  3.9 4.0  CL  --  116* 109  CO2  --  22 24  GLUCOSE  --  122* 123*  BUN  --  19 21  CREATININE  --  0.98 1.13  CALCIUM  --  8.0* 8.3*  MG 2.0 2.0  --     Liver Function Tests: No results for input(s): "AST", "ALT", "ALKPHOS", "  BILITOT", "PROT", "ALBUMIN" in the last 72 hours.  No results for input(s): "LIPASE", "AMYLASE" in the last 72 hours. CBC: Recent Labs    10/28/21 0402 10/29/21 0415  WBC 6.4 8.3  HGB 8.3* 8.3*  HCT 24.7* 24.7*  MCV 91.1 89.8  PLT 89* 131*    Cardiac Enzymes: No results for input(s): "CKTOTAL", "CKMB", "CKMBINDEX", "TROPONINIHS" in the last 72 hours. BNP: Invalid input(s): "POCBNP" D-Dimer: No results for input(s): "DDIMER" in the last 72 hours. Hemoglobin A1C: No results for input(s): "HGBA1C" in the last 72 hours. Fasting Lipid Panel: No results for input(s): "CHOL", "HDL", "LDLCALC", "TRIG", "CHOLHDL", "LDLDIRECT" in the last 72 hours. Thyroid Function Tests: No results for input(s): "TSH", "T4TOTAL", "T3FREE", "THYROIDAB" in the last 72 hours.  Invalid input(s): "FREET3" Anemia Panel: No results for input(s): "VITAMINB12", "FOLATE", "FERRITIN", "TIBC", "IRON", "RETICCTPCT" in the last 72 hours.  DG Chest Port 1 View  Result Date: 10/28/2021 CLINICAL DATA:  Shortness of breath, chest pain. EXAM: PORTABLE CHEST 1 VIEW COMPARISON:  October 25, 2021. FINDINGS: Stable cardiomegaly. Increased coarse reticular opacities are noted throughout both lungs concerning for edema or atypical inflammation. Elevated right hemidiaphragm is noted. Bony thorax is unremarkable. IMPRESSION: Increased bilateral lung opacities are noted concerning for edema or atypical inflammation. Electronically Signed   By: Marijo Conception M.D.   On: 10/28/2021 08:31   CT ABDOMEN PELVIS WO  CONTRAST  Result Date: 10/27/2021 CLINICAL DATA:  Retroperitoneal bleed suspected eval for retroperitoneal bleed EXAM: CT ABDOMEN AND PELVIS WITHOUT CONTRAST TECHNIQUE: Multidetector CT imaging of the abdomen and pelvis was performed following the standard protocol without IV contrast. RADIATION DOSE REDUCTION: This exam was performed according to the departmental dose-optimization program which includes automated exposure control, adjustment of the mA and/or kV according to patient size and/or use of iterative reconstruction technique. COMPARISON:  CT abdomen pelvis 12/10/2011 trauma x-ray left hip 10/25/2021 FINDINGS: Lower chest: Peribronchovascular consolidation within the visualized lung bases. Peripheral reticulations noted. Left heart Amplatzer device noted. Aortic vascular calcifications. At least 3 vessel coronary artery calcification. Cardiac findings suggestive of anemia trace left pleural effusion. Hepatobiliary: No focal liver abnormality. No gallstones, gallbladder wall thickening, or pericholecystic fluid. No biliary dilatation. Pancreas: Diffusely atrophic. No focal lesion. Otherwise normal pancreatic contour. No surrounding inflammatory changes. No main pancreatic ductal dilatation. Spleen: Normal in size without focal abnormality. Adrenals/Urinary Tract: No adrenal nodule bilaterally. Bilateral kidneys enhance symmetrically. No hydronephrosis. No hydroureter. The urinary bladder is unremarkable. Stomach/Bowel: Stomach is within normal limits. No evidence of bowel wall thickening or dilatation. Colonic diverticulosis. The appendix is not definitely identified with no inflammatory changes in the right lower quadrant to suggest acute appendicitis. Vascular/Lymphatic: No abdominal aorta or iliac aneurysm. Moderate to severe atherosclerotic plaque of the aorta and its branches. No abdominal, pelvic, or inguinal lymphadenopathy. Reproductive: Not visualized due to streak artifact originating from  bilateral femoral surgical hardware. Other: No intraperitoneal free fluid. No intraperitoneal free gas. No organized fluid collection. No retroperitoneal hematoma formation. Musculoskeletal: No abdominal wall hernia or abnormality. Subcutaneus soft tissue edema and emphysema along the left hip. Left hip 2.5 x 1.8 cm subcutaneus soft tissue hematoma formation. Overlying skin staples. Atrophic degeneration of the right iliopsoas muscle. No suspicious lytic or blastic osseous lesions. No acute displaced fracture. Multilevel degenerative changes of the spine. Total right hip arthroplasty partially visualized. Proximal femur fracture status post intramedullary fixation partially visualized. IMPRESSION: 1. No retroperitoneal hematoma. 2. Colonic diverticulosis with no acute diverticulitis. 3. Cardiac findings suggestive of  anemia. 4. Peribronchovascular consolidation within the visualized lung bases suggestive of acute infection/inflammation. Peripheral reticulations likely representing underlying fibrosis. 5. Trace left pleural effusion. 6.  Aortic Atherosclerosis (ICD10-I70.0). Electronically Signed   By: Iven Finn M.D.   On: 10/27/2021 18:09     Radiology: [facility] Chest Port 1 View  Result Date: 10/28/2021 CLINICAL DATA:  Shortness of breath, chest pain. EXAM: PORTABLE CHEST 1 VIEW COMPARISON:  October 25, 2021. FINDINGS: Stable cardiomegaly. Increased coarse reticular opacities are noted throughout both lungs concerning for edema or atypical inflammation. Elevated right hemidiaphragm is noted. Bony thorax is unremarkable. IMPRESSION: Increased bilateral lung opacities are noted concerning for edema or atypical inflammation. Electronically Signed   By: Marijo Conception M.D.   On: 10/28/2021 08:31   CT ABDOMEN PELVIS WO CONTRAST  Result Date: 10/27/2021 CLINICAL DATA:  Retroperitoneal bleed suspected eval for retroperitoneal bleed EXAM: CT ABDOMEN AND PELVIS WITHOUT CONTRAST TECHNIQUE: Multidetector CT imaging of  the abdomen and pelvis was performed following the standard protocol without IV contrast. RADIATION DOSE REDUCTION: This exam was performed according to the departmental dose-optimization program which includes automated exposure control, adjustment of the mA and/or kV according to patient size and/or use of iterative reconstruction technique. COMPARISON:  CT abdomen pelvis 12/10/2011 trauma x-ray left hip 10/25/2021 FINDINGS: Lower chest: Peribronchovascular consolidation within the visualized lung bases. Peripheral reticulations noted. Left heart Amplatzer device noted. Aortic vascular calcifications. At least 3 vessel coronary artery calcification. Cardiac findings suggestive of anemia trace left pleural effusion. Hepatobiliary: No focal liver abnormality. No gallstones, gallbladder wall thickening, or pericholecystic fluid. No biliary dilatation. Pancreas: Diffusely atrophic. No focal lesion. Otherwise normal pancreatic contour. No surrounding inflammatory changes. No main pancreatic ductal dilatation. Spleen: Normal in size without focal abnormality. Adrenals/Urinary Tract: No adrenal nodule bilaterally. Bilateral kidneys enhance symmetrically. No hydronephrosis. No hydroureter. The urinary bladder is unremarkable. Stomach/Bowel: Stomach is within normal limits. No evidence of bowel wall thickening or dilatation. Colonic diverticulosis. The appendix is not definitely identified with no inflammatory changes in the right lower quadrant to suggest acute appendicitis. Vascular/Lymphatic: No abdominal aorta or iliac aneurysm. Moderate to severe atherosclerotic plaque of the aorta and its branches. No abdominal, pelvic, or inguinal lymphadenopathy. Reproductive: Not visualized due to streak artifact originating from bilateral femoral surgical hardware. Other: No intraperitoneal free fluid. No intraperitoneal free gas. No organized fluid collection. No retroperitoneal hematoma formation. Musculoskeletal: No abdominal  wall hernia or abnormality. Subcutaneus soft tissue edema and emphysema along the left hip. Left hip 2.5 x 1.8 cm subcutaneus soft tissue hematoma formation. Overlying skin staples. Atrophic degeneration of the right iliopsoas muscle. No suspicious lytic or blastic osseous lesions. No acute displaced fracture. Multilevel degenerative changes of the spine. Total right hip arthroplasty partially visualized. Proximal femur fracture status post intramedullary fixation partially visualized. IMPRESSION: 1. No retroperitoneal hematoma. 2. Colonic diverticulosis with no acute diverticulitis. 3. Cardiac findings suggestive of anemia. 4. Peribronchovascular consolidation within the visualized lung bases suggestive of acute infection/inflammation. Peripheral reticulations likely representing underlying fibrosis. 5. Trace left pleural effusion. 6.  Aortic Atherosclerosis (ICD10-I70.0). Electronically Signed   By: Iven Finn M.D.   On: 10/27/2021 18:09   ECHOCARDIOGRAM COMPLETE  Result Date: 10/27/2021    ECHOCARDIOGRAM REPORT   Patient Name:   Jeremy Herrera Date of Exam: 10/27/2021 Medical Rec #:  [id number]         Height:       73.0 in Accession #:    [id number]  Weight:       215.6 lb Date of Birth:  Jun 29, 1940          BSA:          2.221 m Patient Age:    81 years          BP:           71/61 mmHg Patient Gender: M                 HR:           117 bpm. Exam Location:  [facility] Procedure: 2D Echo, Cardiac Doppler and Color Doppler Indications:     Atrial Fibrillation I 48.91  History:         Patient has prior history of Echocardiogram examinations, most                  recent 05/17/2019. CHF; Risk Factors:Diabetes, Hypertension and                  Dyslipidemia.  Sonographer:     Cristela Blue Referring Phys:  [id number] Sunnie Nielsen Diagnosing Phys: Julien Nordmann MD  Sonographer Comments: Suboptimal apical window and Technically challenging study due to limited acoustic windows. IMPRESSIONS  1. Left ventricular  ejection fraction, by estimation, is 60 to 65%. The left ventricle has normal function. The left ventricle has no regional wall motion abnormalities. Left ventricular diastolic parameters are indeterminate.  2. Right ventricular systolic function is normal. The right ventricular size is normal. There is normal pulmonary artery systolic pressure. The estimated right ventricular systolic pressure is 17.7 mmHg.  3. Left atrial size was severely dilated.  4. The mitral valve is normal in structure. No evidence of mitral valve regurgitation. No evidence of mitral stenosis.  5. The aortic valve was not well visualized. There is moderate calcification of the aortic valve. Aortic valve regurgitation is not visualized. Aortic valve sclerosis is present, with no evidence of aortic valve stenosis. Aortic valve area, by VTI measures 1.29 cm. Aortic valve mean gradient measures 6.3 mmHg. Aortic valve Vmax measures 1.69 m/s.  6. The inferior vena cava is normal in size with greater than 50% respiratory variability, suggesting right atrial pressure of 3 mmHg. FINDINGS  Left Ventricle: Left ventricular ejection fraction, by estimation, is 60 to 65%. The left ventricle has normal function. The left ventricle has no regional wall motion abnormalities. The left ventricular internal cavity size was normal in size. There is  no left ventricular hypertrophy. Left ventricular diastolic parameters are indeterminate. Right Ventricle: The right ventricular size is normal. No increase in right ventricular wall thickness. Right ventricular systolic function is normal. There is normal pulmonary artery systolic pressure. The tricuspid regurgitant velocity is 1.78 m/s, and  with an assumed right atrial pressure of 5 mmHg, the estimated right ventricular systolic pressure is 17.7 mmHg. Left Atrium: Left atrial size was severely dilated. Right Atrium: Right atrial size was normal in size. Pericardium: There is no evidence of pericardial effusion.  Mitral Valve: The mitral valve is normal in structure. No evidence of mitral valve regurgitation. No evidence of mitral valve stenosis. Tricuspid Valve: The tricuspid valve is normal in structure. Tricuspid valve regurgitation is mild . No evidence of tricuspid stenosis. Aortic Valve: The aortic valve was not well visualized. There is moderate calcification of the aortic valve. Aortic valve regurgitation is not visualized. Aortic valve sclerosis is present, with no evidence of aortic valve stenosis. Aortic valve mean gradient measures 6.3 mmHg. Aortic  valve peak gradient measures 11.4 mmHg. Aortic valve area, by VTI measures 1.29 cm. Pulmonic Valve: The pulmonic valve was normal in structure. Pulmonic valve regurgitation is not visualized. No evidence of pulmonic stenosis. Aorta: The aortic root is normal in size and structure. Venous: The inferior vena cava is normal in size with greater than 50% respiratory variability, suggesting right atrial pressure of 3 mmHg. IAS/Shunts: No atrial level shunt detected by color flow Doppler.  LEFT VENTRICLE PLAX 2D LVIDd:         5.00 cm LVIDs:         4.15 cm LV PW:         1.40 cm LV IVS:        1.10 cm LVOT diam:     2.00 cm LV SV:         40 LV SV Index:   18 LVOT Area:     3.14 cm  RIGHT VENTRICLE RV Basal diam:  4.10 cm RV S prime:     11.10 cm/s TAPSE (M-mode): 2.7 cm LEFT ATRIUM            Index        RIGHT ATRIUM           Index LA diam:      5.50 cm  2.48 cm/m   RA Area:     21.90 cm LA Vol (A2C): 104.0 ml 46.82 ml/m  RA Volume:   70.70 ml  31.83 ml/m LA Vol (A4C): 184.0 ml 82.83 ml/m  AORTIC VALVE AV Area (Vmax):    1.15 cm AV Area (Vmean):   1.05 cm AV Area (VTI):     1.29 cm AV Vmax:           169.00 cm/s AV Vmean:          117.667 cm/s AV VTI:            0.307 m AV Peak Grad:      11.4 mmHg AV Mean Grad:      6.3 mmHg LVOT Vmax:         62.00 cm/s LVOT Vmean:        39.400 cm/s LVOT VTI:          0.126 m LVOT/AV VTI ratio: 0.41  AORTA Ao Root diam: 3.60  cm MITRAL VALVE                TRICUSPID VALVE MV Area (PHT): 4.63 cm     TR Peak grad:   12.7 mmHg MV Decel Time: 164 msec     TR Vmax:        178.00 cm/s MV E velocity: 112.00 cm/s                             SHUNTS                             Systemic VTI:  0.13 m                             Systemic Diam: 2.00 cm Ida Rogue MD Electronically signed by Ida Rogue MD Signature Date/Time: 10/27/2021/4:19:09 PM    Final    DG HIP UNILAT WITH PELVIS 2-3 VIEWS LEFT  Result Date: 10/25/2021 CLINICAL DATA:  ORIF LEFT femur EXAM: DG HIP (WITH OR WITHOUT PELVIS) 2-3V LEFT COMPARISON:  10/25/2021 Fluoroscopy time: 3 minutes 47 seconds Dose: Not provided; please refer to operative records Images: 6 FINDINGS: Osseous demineralization. IM nail with 2 compression screws placed across an intertrochanteric fracture LEFT femur. Significant improvement in fracture fragment alignment since previous exam. Distal locking screw present. IMPRESSION: Post nailing of intertrochanteric fracture LEFT femur. Electronically Signed   By: Lavonia Dana M.D.   On: 10/25/2021 18:42   DG C-Arm 1-60 Min-No Report  Result Date: 10/25/2021 Fluoroscopy was utilized by the requesting physician.  No radiographic interpretation.   CT CHEST WO CONTRAST  Result Date: 10/25/2021 CLINICAL DATA:  Fall.  Pleural effusion. EXAM: CT CHEST WITHOUT CONTRAST TECHNIQUE: Multidetector CT imaging of the chest was performed following the standard protocol without IV contrast. RADIATION DOSE REDUCTION: This exam was performed according to the departmental dose-optimization program which includes automated exposure control, adjustment of the mA and/or kV according to patient size and/or use of iterative reconstruction technique. COMPARISON:  Radiograph of same day.  CT scan of Jul 04, 2017. FINDINGS: Cardiovascular: Atherosclerosis of thoracic aorta is noted without aneurysm formation. Aortic valves calcifications are noted. Normal cardiac size. No  pericardial effusion. Mild coronary artery calcifications are noted. Mediastinum/Nodes: No enlarged mediastinal or axillary lymph nodes. Thyroid gland, trachea, and esophagus demonstrate no significant findings. Lungs/Pleura: No pneumothorax or pleural effusion is noted. Reticular densities are noted peripherally in both lungs suggesting chronic interstitial lung disease. Interval development of 6 mm nodule seen in left lower lobe best seen on image number 121 of series 3. Multiple patchy airspace opacities are noted in the right lung which may represent multifocal inflammation, or potentially severe fibrosis secondary to chronic scarring or inflammation. Some degree of mosaic pattern is seen in the left lung suggesting multifocal inflammation or air trapping secondary to small airways disease. Some degree of bronchiectasis is noted in both lower lobes. Upper Abdomen: No acute abnormality. Musculoskeletal: No chest wall mass or suspicious bone lesions identified. IMPRESSION: There is interval development of multiple patchy airspace opacities in the right lung which may represent multifocal inflammation or pneumonia, or potentially severe masslike fibrosis secondary to chronic scarring or inflammation. Some degree of mosaic pattern is also noted in the left lung suggesting multifocal inflammation or air trapping secondary to small airways disease. There are again noted reticular densities noted peripherally throughout both lungs suggesting chronic interstitial lung disease, as well as some degree of bronchiectasis seen in both lower lobes, most consistent with chronic findings. Interval development of small nodule seen in left lower lobe. Non-contrast chest CT at 6-12 months is recommended. If the nodule is stable at time of repeat CT, then future CT at 18-24 months (from today's scan) is considered optional for low-risk patients, but is recommended for high-risk patients. This recommendation follows the consensus  statement: Guidelines for Management of Incidental Pulmonary Nodules Detected on CT Images: From the Fleischner Society 2017; Radiology 2017; 284:228-243. Mild coronary artery calcifications are noted. Aortic valve calcifications are noted. Aortic Atherosclerosis (ICD10-I70.0). Electronically Signed   By: Marijo Conception M.D.   On: 10/25/2021 14:56   DG Chest 1 View  Result Date: 10/25/2021 CLINICAL DATA:  Fall EXAM: CHEST  1 VIEW COMPARISON:  Radiograph 05/16/2019 FINDINGS: Enlarged cardiac silhouette. Appendage occlusion device noted. There is pulmonary vascular congestion with interstitial prominent bilaterally, left greater than right. No pleural effusion. No pneumothorax. No acute osseous abnormality identified on frontal radiographs of the chest. IMPRESSION: Cardiomegaly with pulmonary vascular congestion and interstitial prominence bilaterally, left greater than right,  which could reflect a degree of interstitial edema. Electronically Signed   By: Maurine Simmering M.D.   On: 10/25/2021 11:57   DG Hip Unilat With Pelvis 2-3 Views Left  Result Date: 10/25/2021 CLINICAL DATA:  Fall, pain EXAM: DG HIP (WITH OR WITHOUT PELVIS) 2-3V LEFT COMPARISON:  None Available. FINDINGS: Osteopenia. Comminuted, displaced and impacted intratrochanteric fractures of the left hip. Status post right hip total arthroplasty. No displaced fracture or dislocation of the bony pelvis. Nonobstructive pattern of overlying bowel gas. IMPRESSION: 1. Comminuted, displaced and impacted intratrochanteric fractures of the left hip. 2. Status post right hip total arthroplasty. 3. No displaced fracture or dislocation of the bony pelvis. Electronically Signed   By: Delanna Ahmadi M.D.   On: 10/25/2021 11:52    ECHO EF 50%, G1 DD 05/2020  TELEMETRY reviewed by me (LT) 10/29/2021 : Atrial fibrillation with variable heart rate 100s to paroxysms in the 120s to 130s  EKG reviewed by me: Atrial fibrillation rate 125 with significant artifact  throughout.  Data reviewed by me (LT) 10/29/2021: hospitalist progress note, orthopedic surgery progress note, I/Os, CBC, BMP,  telemetry, vitals ,cxr  ASSESSMENT AND PLAN:  Jeremy Herrera is an 2yoM with a PMH of chronic atrial fibrillation s/p Watchman 2020, mild AS, HFpEF (EF 50%, G1 DD 05/2020), hypertension, hyperlipidemia, type 2 diabetes who presented to [facility] ED 10/25/2021 after mechanical fall.  Left hip x-ray showed a comminuted displaced and impacted left hip intertrochanteric fracture.  He underwent left intramedullary nailing with orthopedic surgery on 9/5, became hypotensive and tachycardic postoperatively requiring vasopressor support in the ICU.  He remains in atrial fibrillation with RVR for which cardiology is consulted.  #Left hip fracture s/p IM nailing 9/5 #Chronic atrial fibrillation with RVR s/p Watchman 2020 #Postoperative anemia Presented after a mechanical fall, underwent surgery on 9/5, became hypotensive and tachycardic postoperatively suspected due to hypovolemia versus sedation, responding to IV fluids and phenylephrine.  He has had intermittent tachycardia with heart rates in the 120s to 130s with conversation, although is in the 90s to 100s at rest.  Notably, his hemoglobin has declined from 11.1 down to 7.5 over the past 2 days.  He had some chest pressure, chills, and right-sided neck discomfort, somewhat relieved with nitroglycerin and repeat EKG during that episode had significant artifact but was not obviously ischemic appearing. On 9/8 feels somewhat short of breath.  -No further intervention at this time from the cardiovascular standpoint in atrial fibrillation due to improved heart rate likely due to improvements of overall hemodynamic status.   Signed: Corey Skains ,   10/29/2021, 4:43 PM [facility]

## 2021-10-29 NOTE — Progress Notes (Signed)
[employer] - informed by RN at [phone] that Neo infusion was restarted due to low blood pressures at 0400  Action - Dose of midodrine ordered  Donnie Mesa NP  [facility]  I

## 2021-10-29 NOTE — Progress Notes (Signed)
Physical Therapy Treatment Patient Details Name: Jeremy Herrera MRN: [id number] DOB: 18-Jun-1940 Today's Date: 10/29/2021   History of Present Illness Jeremy Herrera is a 81 y.o. male who had a fall earlier today.  The patient noted immediate hip pain and inability to ambulate.  The patient ambulates with a walker at baseline.  The patient lives at home with his daughter. Pain is worse with any sort of movement.  X-rays in the emergency department show a left intertrochanteric hip fracture. Pt is POD 1 from hip nailing.    PT Comments    Pt seen for PT tx with pt agreeable. Nurse cleared pt for bed level exercises as pt still with difficulty maintaining WNL BP (remains low). Pt performed LLE strengthening exercises with pt able to tolerate a few more on this date, however, by end of session pt appears very fatigued. Pt would benefit from ongoing PT services & to progress to sitting EOB when medically appropriate.     Recommendations for follow up therapy are one component of a multi-disciplinary discharge planning process, led by the attending physician.  Recommendations may be updated based on patient status, additional functional criteria and insurance authorization.  Follow Up Recommendations  Skilled nursing-short term rehab (<3 hours/day) Can patient physically be transported by private vehicle: No   Assistance Recommended at Discharge Frequent or constant Supervision/Assistance  Patient can return home with the following Two people to help with walking and/or transfers;Two people to help with bathing/dressing/bathroom;Help with stairs or ramp for entrance;Assist for transportation   Equipment Recommendations  None recommended by PT (TBD in next venue)    Recommendations for Other Services       Precautions / Restrictions Precautions Precautions: Fall Restrictions Weight Bearing Restrictions: Yes LLE Weight Bearing: Weight bearing as tolerated     Mobility  Bed Mobility                     Transfers                        Ambulation/Gait                   Stairs             Wheelchair Mobility    Modified Rankin (Stroke Patients Only)       Balance                                            Cognition Arousal/Alertness: Awake/alert Behavior During Therapy: WFL for tasks assessed/performed Overall Cognitive Status: Within Functional Limits for tasks assessed                                          Exercises General Exercises - Lower Extremity Ankle Circles/Pumps: Strengthening, Both, 10 reps, Supine, AROM Quad Sets: AROM, Strengthening, Supine, 10 reps, Left Short Arc Quad: AAROM, Strengthening, Left, 10 reps, Supine Heel Slides: AAROM, Supine, Strengthening, Left, 10 reps Hip ABduction/ADduction: AAROM, Strengthening, Left, 10 reps, Supine (hip abduction slides x 10 + hip adduction pillow squeezes x 10) Straight Leg Raises: AAROM, Strengthening, Left, 10 reps, Supine    General Comments        Pertinent Vitals/Pain Pain Assessment Pain Assessment: Faces Faces Pain  Scale: Hurts little more Pain Location: LLE Pain Descriptors / Indicators: Grimacing Pain Intervention(s): Monitored during session, Limited activity within patient's tolerance, Repositioned    Home Living                          Prior Function            PT Goals (current goals can now be found in the care plan section) Acute Rehab PT Goals Patient Stated Goal: to go home PT Goal Formulation: With patient Time For Goal Achievement: 11/09/21 Potential to Achieve Goals: Fair Progress towards PT goals: Progressing toward goals    Frequency    7X/week      PT Plan Current plan remains appropriate    Co-evaluation              AM-PAC PT "6 Clicks" Mobility   Outcome Measure  Help needed turning from your back to your side while in a flat bed without using bedrails?: A  Lot Help needed moving from lying on your back to sitting on the side of a flat bed without using bedrails?: Total Help needed moving to and from a bed to a chair (including a wheelchair)?: Total Help needed standing up from a chair using your arms (e.g., wheelchair or bedside chair)?: Total Help needed to walk in hospital room?: Total Help needed climbing 3-5 steps with a railing? : Total 6 Click Score: 7    End of Session Equipment Utilized During Treatment: Oxygen Activity Tolerance: Patient tolerated treatment well Patient left: in bed;with call bell/phone within reach   PT Visit Diagnosis: Muscle weakness (generalized) (M62.81);Difficulty in walking, not elsewhere classified (R26.2);Unsteadiness on feet (R26.81);Pain Pain - Right/Left: Left Pain - part of body: Hip     Time: 2355-7322 PT Time Calculation (min) (ACUTE ONLY): 10 min  Charges:  $Therapeutic Activity: 8-22 mins                     Aleda Grana, PT, DPT 10/29/21, 12:46 PM   Sandi Mariscal 10/29/2021, 12:44 PM

## 2021-10-29 NOTE — Progress Notes (Signed)
Pt bp dropping at 0315. Pt denies dizziness and is asymptomatic although bp is 64/48. Pt remains in a-fib with HR of 98. Resp even and unlabored on RA. Changed pt cuff and moved arms and BP remained the same. Restarted ordered Neo and later notified Steward Drone NP of the need to restart Neo as well as patient status. Inquired about change in level of care change due to vasopressor. Awaiting further orders.

## 2021-10-29 NOTE — Progress Notes (Addendum)
PROGRESS NOTE    Jeremy Herrera   WUJ:[id number] DOB: 03-31-1940  DOA: 10/25/2021 Date of Service: 10/29/21 PCP: Marina Goodell, MD     Brief Narrative / Hospital Course:  Jeremy Herrera is a 81 y.o. male with medical history significant for chronic diastolic dysfunction CHF, hypertension, dyslipidemia, A-fib (BB, Plavix, ASA - NOT on anticoagulation), BPH who presents to the ER from home on 10/25/2021 via EMS for evaluation of left hip pain following a mechanical fall. No syncope. Chronic SOB. Uses roller walker at baseline.  09/05: X-ray of the left hip shows comminuted, displaced and impacted intratrochanteric fractures of the left hip. Status post right hip total arthroplasty. No displaced fracture or dislocation of the bony pelvis. CT chest: interval development of multiple patchy airspace opacities in the right lung which may represent multifocal inflammation or pneumonia, or potentially severe masslike fibrosis secondary to chronic scarring or inflammation. 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 Orthopedic surgery performed intramedullary nailing of L femur with cephalomedullary device. Postoperative Afib RVR requiring vasopressor support, ICU consulted and maintaining phenylephrine gtt.  09/06: tachycardiac and soft BP overnight improving, tachypnea about the same. Hgb slight drop to 8.9 postop. Received albumin overnight. Still on phenylephrine this morning.  09/07: BP improved and HR up, low dose IV lopressor given but BP dropped.  Continues to have significant dyspnea though he states this is about at baseline.  Echocardiogram ordered. Cardiology consulted: Recommending continuing to treat causes increased adrenergic tone including pain/anemia, wean phenylephrine as tolerated, consider repeat CXR and transfusion PRBC, okay for continuing metoprolol 12.5 mg twice daily, echocardiogram pending.  Given anemia, patient was scanned for rule out retroperitoneal bleed, no concerns  there.  Was amenable to receiving 1 unit PRBC. 09/08: Remains tachycardic/tachypneic, this morning requiring 2 LNC.  Hgb up to 8.3.  CXR this a.m. Increased bilateral lung opacities are noted concerning for edema or atypical inflammation.  Stopped IV fluids, IV Lasix 40 mg x 2, increase metoprolol to tartrate 12.5 mg to 3 times daily, echocardiogram showed EF 60-65, normal LV function, no RWMA, diastolic parameters indeterminate.  Remains off pressors but blood pressure is low at 77/53 late this morning.  09/09: Pressors restarted overnight, midodrine also added by night coverage.   Consultants:  Orthopedics (Dr. Signa Kell) Cardiology [facility] clinic, Dr. Gwen Pounds)  Procedures: 09/05: Repair L hip fracture (intramedullary nailing of L femur with cephalomedullary device)      ASSESSMENT & PLAN:   Principal Problem:   Closed displaced intertrochanteric fracture of left femur (HCC) Active Problems:   Depression   Diastolic CHF, chronic (HCC)   COPD (chronic obstructive pulmonary disease) (HCC)   Atrial fibrillation, chronic (HCC)   Postoperative atrial fibrillation w/ RVR and associated hypotension requiring vasopressor support in ICU  Closed displaced intertrochanteric fracture of left femur (HCC) Status post mechanical fall with  closed intertrochanteric fracture of left femur Orthopedic surgery following, today 10/29/21 he is POD4 s/p Repair L hip fracture (intramedullary nailing of L femur with cephalomedullary device) morphine IV, robaxin and will start acetaminophen, consider oxycodone if pain not well controlled  Postoperative atrial fibrillation w/ RVR and associated hypotension requiring vasopressor support in ICU Patient not on long-term anticoagulation therapy but has a Watchman device in place Metoprolol was held for surgery, restarted metoprolol tartrate 12.5 mg twice daily Initially thought likely hypovolemia however did not seem to be improving with fluid  resuscitation 500 mL IV fluids given postop (caution w/ Hx HFpEF) Requiring phenylephrine gtt --> was  off this yesterday but back on again this morning Midodrine added 10/29/2021 Low dose IV beta blocker for RVR, taking care w/ hypotension Holding other cardiac and anti-hypertensive meds d/t hypotension Telemetry Echocardiogram EF 60-65, normal LV function, no RWMA, diastolic parameters indeterminate Cardiology consult:  Recommending continuing to treat causes increased adrenergic tone including pain/anemia,  wean phenylephrine as tolerated -he has been off of this since yesterday, repeat CXR which showed increased vascular congestion and lasix was ordered today 09/08  transfusion 1 unit PRBC 09/07 metoprolol 12.5 mg twice daily 09/07 --> increased to tid 09/08  Diastolic CHF, chronic (HCC) - does not appear to be in acute exacerbation Patient has a history of chronic diastolic dysfunction CHF with last known 2D echocardiogram showed an LVEF of 50% from 2022 Chest x-ray in ED showed findings suggestive of pulmonary vascular congestion, received 1 dose of Lasix 20 mg IV, improved but then recurred based on CXR morning of 10/28/2021, received another round of Lasix. Restart lisinopril when BP allows Echocardiogram as above cardiology consult as above  Depression Continue sertraline  COPD (chronic obstructive pulmonary disease) (HCC) Stable and not acutely exacerbated though there is concern for chronic inflammation/ILD See CT chest results - likely needing f/u outpatient  Breathing has been at baseline  Continue as needed bronchodilator therapy as well as inhaled steroids  Anemia Uncertain etiology but significant drop Hgb seems more than shoule expect postop Hemoglobin has been stable greater than 8 since transfusion CT abd/pelvis to eval spontaneous retroperitoneal bleed 09/07- negative 1 unit PRBC 09/07 Hemoccult pending Consider GI consult       DVT prophylaxis:  SCD Pertinent IV fluids/nutrition: none Central lines / invasive devices: n/a  Code Status: DNR Family Communication: None at this time, will call with any urgent updates  Disposition: inpatient for now [name] needs: likely will need rehab SNF  Barriers to discharge / significant pending items: stabilization of Afib, likely needs placement, anticipate d/c once able to come off pressors and BP/HR maintaining but has been hypotensive, tachycardic, tachypneic.                    Subjective:  Patient reports feeling okay today, denies chest pain or shortness of breath.  He feels discouraged that he is not getting better       Objective:  Vitals:   10/29/21 1100 10/29/21 1130 10/29/21 1200 10/29/21 1230  BP: 95/65 95/67 95/66  97/62  Pulse: 93 100 (!) 105 77  Resp: (!) 25 (!) 31 (!) 26 (!) 27  Temp:   97.6 F (36.4 C)   TempSrc:   Oral   SpO2: 100% 100% 99% 100%  Weight:      Height:        Intake/Output Summary (Last 24 hours) at 10/29/2021 1255 Last data filed at 10/29/2021 1210 Gross per 24 hour  Intake 769.45 ml  Output 3275 ml  Net -2505.55 ml    Filed Weights   10/25/21 1111 10/25/21 1525 10/25/21 2105  Weight: 102.3 kg 93 kg 97.8 kg    Examination:  Constitutional:  VS as above General Appearance: alert, well-developed, well-nourished, NAD Respiratory: Conversational dyspnea No wheeze No rhonchi No rales Cardiovascular: S1/S2 normal Irreg / tachycardic  No murmur No rub/gallop auscultated No JVD No lower extremity edema Gastrointestinal: No tenderness Musculoskeletal:  No clubbing/cyanosis of digits Neurological: No cranial nerve deficit on limited exam Alert Psychiatric: Normal judgment/insight Pleasant mood and affect       Scheduled Medications:   acetaminophen  1,000 mg Oral Q8H   Chlorhexidine Gluconate Cloth  6 each Topical Q0600   cyanocobalamin  1,000 mcg Per Tube Daily   docusate sodium  100 mg Oral BID    donepezil  5 mg Per Tube QHS   feeding supplement  237 mL Oral TID BM   ferrous sulfate  325 mg Oral Daily   latanoprost  1 drop Both Eyes QHS   metoprolol tartrate  12.5 mg Oral TID   multivitamin with minerals  1 tablet Oral Daily   pantoprazole  40 mg Oral BID AC   rosuvastatin  10 mg Oral Daily   senna  1 tablet Oral BID   sertraline  25 mg Oral Daily   umeclidinium-vilanterol  1 puff Inhalation Daily    Continuous Infusions:  methocarbamol (ROBAXIN) IV     phenylephrine (NEO-SYNEPHRINE) Adult infusion 60 mcg/min (10/29/21 1210)   potassium chloride Stopped (10/29/21 1057)    PRN Medications:  bisacodyl, HYDROmorphone (DILAUDID) injection, ketoconazole, methocarbamol **OR** methocarbamol (ROBAXIN) IV, metoCLOPramide **OR** metoCLOPramide (REGLAN) injection, metoprolol tartrate, nitroGLYCERIN, ondansetron **OR** ondansetron (ZOFRAN) IV, oxyCODONE, oxyCODONE, senna-docusate, sodium phosphate, traMADol  Antimicrobials:  Anti-infectives (From admission, onward)    Start     Dose/Rate Route Frequency Ordered Stop   10/26/21 1106  ceFAZolin (ANCEF) IVPB 2g/100 mL premix        2 g 200 mL/hr over 30 Minutes Intravenous Every 6 hours 10/26/21 1106 10/27/21 0040   10/25/21 1516  ceFAZolin (ANCEF) 2-4 GM/100ML-% IVPB       Note to Pharmacy: Maryagnes Amos B: cabinet override      10/25/21 1516 10/27/21 0040   10/25/21 1300  ceFAZolin (ANCEF) IVPB 2g/100 mL premix        2 g 200 mL/hr over 30 Minutes Intravenous  Once 10/25/21 1252 10/25/21 1717       Data Reviewed: I have personally reviewed following labs and imaging studies  CBC: Recent Labs  Lab 10/25/21 1125 10/25/21 2014 10/26/21 0419 10/27/21 0606 10/27/21 2125 10/28/21 0402 10/29/21 0415  WBC 7.3  --  10.9* 6.0  --  6.4 8.3  NEUTROABS 5.1  --   --   --   --   --   --   HGB 13.1   < > 8.9* 7.5* 8.2* 8.3* 8.3*  HCT 39.7   < > 26.1* 22.4* 24.5* 24.7* 24.7*  MCV 92.1  --  91.6 92.6  --  91.1 89.8  PLT 154  --   148* 105*  --  89* 131*   < > = values in this interval not displayed.    Basic Metabolic Panel: Recent Labs  Lab 10/25/21 1125 10/26/21 0419 10/27/21 0606 10/27/21 1504 10/28/21 0402 10/29/21 0415  NA 143 144 139  --  139 140  K 3.8 4.8 4.3  --  3.9 4.0  CL 111 117* 113*  --  116* 109  CO2 25 23 22   --  22 24  GLUCOSE 130* 148* 133*  --  122* 123*  BUN 22 22 19   --  19 21  CREATININE 1.03 0.97 1.12  --  0.98 1.13  CALCIUM 8.9 8.1* 7.9*  --  8.0* 8.3*  MG 2.1 1.7  --  2.0 2.0  --   PHOS  --  3.2  --   --   --   --     GFR: Estimated Creatinine Clearance: 62.1 mL/min (by C-G formula based on SCr of 1.13 mg/dL). Liver Function Tests: Recent Labs  Lab 10/25/21 2324  ALBUMIN 2.8*    No results for input(s): "LIPASE", "AMYLASE" in the last 168 hours. No results for input(s): "AMMONIA" in the last 168 hours. Coagulation Profile: Recent Labs  Lab 10/25/21 1125  INR 1.0    Cardiac Enzymes: No results for input(s): "CKTOTAL", "CKMB", "CKMBINDEX", "TROPONINI" in the last 168 hours. BNP (last 3 results) No results for input(s): "PROBNP" in the last 8760 hours. HbA1C: No results for input(s): "HGBA1C" in the last 72 hours. CBG: Recent Labs  Lab 10/25/21 1906 10/25/21 2104  GLUCAP 125* 155*    Lipid Profile: No results for input(s): "CHOL", "HDL", "LDLCALC", "TRIG", "CHOLHDL", "LDLDIRECT" in the last 72 hours. Thyroid Function Tests: No results for input(s): "TSH", "T4TOTAL", "FREET4", "T3FREE", "THYROIDAB" in the last 72 hours. Anemia Panel: No results for input(s): "VITAMINB12", "FOLATE", "FERRITIN", "TIBC", "IRON", "RETICCTPCT" in the last 72 hours. Urine analysis:    Component Value Date/Time   COLORURINE YELLOW (A) 03/31/2018 1203   APPEARANCEUR CLEAR (A) 03/31/2018 1203   APPEARANCEUR Clear 04/02/2013 1415   LABSPEC 1.020 03/31/2018 1203   LABSPEC 1.015 04/02/2013 1415   PHURINE 5.0 03/31/2018 1203   GLUCOSEU NEGATIVE 03/31/2018 1203   GLUCOSEU  Negative 04/02/2013 1415   HGBUR MODERATE (A) 03/31/2018 1203   BILIRUBINUR NEGATIVE 03/31/2018 1203   BILIRUBINUR Negative 04/02/2013 1415   KETONESUR NEGATIVE 03/31/2018 1203   PROTEINUR NEGATIVE 03/31/2018 1203   NITRITE NEGATIVE 03/31/2018 1203   LEUKOCYTESUR TRACE (A) 03/31/2018 1203   LEUKOCYTESUR Negative 04/02/2013 1415   Sepsis Labs: @LABRCNTIP (procalcitonin:4,lacticidven:4)  Recent Results (from the past 240 hour(s))  MRSA Next Gen by PCR, Nasal     Status: None   Collection Time: 10/25/21  9:07 PM   Specimen: Nasal Mucosa; Nasal Swab  Result Value Ref Range Status   MRSA by PCR Next Gen NOT DETECTED NOT DETECTED Final    Comment: (NOTE) The GeneXpert MRSA Assay (FDA approved for NASAL specimens only), is one component of a comprehensive MRSA colonization surveillance program. It is not intended to diagnose MRSA infection nor to guide or monitor treatment for MRSA infections. Test performance is not FDA approved in patients less than 64 years old. Performed at [facility], [address] [place], [place] [address]          Radiology Studies: DG HIP UNILAT WITH PELVIS 2-3 VIEWS LEFT  Result Date: 10/25/2021 CLINICAL DATA:  ORIF LEFT femur EXAM: DG HIP (WITH OR WITHOUT PELVIS) 2-3V LEFT COMPARISON:  10/25/2021 Fluoroscopy time: 3 minutes 47 seconds Dose: Not provided; please refer to operative records Images: 6 FINDINGS: Osseous demineralization. IM nail with 2 compression screws placed across an intertrochanteric fracture LEFT femur. Significant improvement in fracture fragment alignment since previous exam. Distal locking screw present. IMPRESSION: Post nailing of intertrochanteric fracture LEFT femur. Electronically Signed   By: Lavonia Dana M.D.   On: 10/25/2021 18:42   DG C-Arm 1-60 Min-No Report  Result Date: 10/25/2021 Fluoroscopy was utilized by the requesting physician.  No radiographic interpretation.   CT CHEST WO CONTRAST  Result Date:  10/25/2021 CLINICAL DATA:  Fall.  Pleural effusion. EXAM: CT CHEST WITHOUT CONTRAST TECHNIQUE: Multidetector CT imaging of the chest was performed following the standard protocol without IV contrast. RADIATION DOSE REDUCTION: This exam was performed according to the departmental dose-optimization program which includes automated exposure control, adjustment of the mA and/or kV according to patient size and/or use of iterative reconstruction technique. COMPARISON:  Radiograph of same day.  CT scan of Jul 04, 2017. FINDINGS: Cardiovascular: Atherosclerosis of thoracic aorta is noted without aneurysm formation. Aortic valves calcifications are noted. Normal cardiac size. No pericardial effusion. Mild coronary artery calcifications are noted. Mediastinum/Nodes: No enlarged mediastinal or axillary lymph nodes. Thyroid gland, trachea, and esophagus demonstrate no significant findings. Lungs/Pleura: No pneumothorax or pleural effusion is noted. Reticular densities are noted peripherally in both lungs suggesting chronic interstitial lung disease. Interval development of 6 mm nodule seen in left lower lobe best seen on image number 121 of series 3. Multiple patchy airspace opacities are noted in the right lung which may represent multifocal inflammation, or potentially severe fibrosis secondary to chronic scarring or inflammation. Some degree of mosaic pattern is seen in the left lung suggesting multifocal inflammation or air trapping secondary to small airways disease. Some degree of bronchiectasis is noted in both lower lobes. Upper Abdomen: No acute abnormality. Musculoskeletal: No chest wall mass or suspicious bone lesions identified. IMPRESSION: There is interval development of multiple patchy airspace opacities in the right lung which may represent multifocal inflammation or pneumonia, or potentially severe masslike fibrosis secondary to chronic scarring or inflammation. Some degree of mosaic pattern is also noted in  the left lung suggesting multifocal inflammation or air trapping secondary to small airways disease. There are again noted reticular densities noted peripherally throughout both lungs suggesting chronic interstitial lung disease, as well as some degree of bronchiectasis seen in both lower lobes, most consistent with chronic findings. Interval development of small nodule seen in left lower lobe. Non-contrast chest CT at 6-12 months is recommended. If the nodule is stable at time of repeat CT, then future CT at 18-24 months (from today's scan) is considered optional for low-risk patients, but is recommended for high-risk patients. This recommendation follows the consensus statement: Guidelines for Management of Incidental Pulmonary Nodules Detected on CT Images: From the Fleischner Society 2017; Radiology 2017; 284:228-243. Mild coronary artery calcifications are noted. Aortic valve calcifications are noted. Aortic Atherosclerosis (ICD10-I70.0). Electronically Signed   By: Marijo Conception M.D.   On: 10/25/2021 14:56   DG Chest 1 View  Result Date: 10/25/2021 CLINICAL DATA:  Fall EXAM: CHEST  1 VIEW COMPARISON:  Radiograph 05/16/2019 FINDINGS: Enlarged cardiac silhouette. Appendage occlusion device noted. There is pulmonary vascular congestion with interstitial prominent bilaterally, left greater than right. No pleural effusion. No pneumothorax. No acute osseous abnormality identified on frontal radiographs of the chest. IMPRESSION: Cardiomegaly with pulmonary vascular congestion and interstitial prominence bilaterally, left greater than right, which could reflect a degree of interstitial edema. Electronically Signed   By: Maurine Simmering M.D.   On: 10/25/2021 11:57   DG Hip Unilat With Pelvis 2-3 Views Left  Result Date: 10/25/2021 CLINICAL DATA:  Fall, pain EXAM: DG HIP (WITH OR WITHOUT PELVIS) 2-3V LEFT COMPARISON:  None Available. FINDINGS: Osteopenia. Comminuted, displaced and impacted intratrochanteric  fractures of the left hip. Status post right hip total arthroplasty. No displaced fracture or dislocation of the bony pelvis. Nonobstructive pattern of overlying bowel gas. IMPRESSION: 1. Comminuted, displaced and impacted intratrochanteric fractures of the left hip. 2. Status post right hip total arthroplasty. 3. No displaced fracture or dislocation of the bony pelvis. Electronically Signed   By: Delanna Ahmadi M.D.   On: 10/25/2021 11:52    No results found.         LOS: 4 days       Emeterio Reeve, DO [facility] 10/29/2021, 12:55 PM   Staff may message me via secure chat in [place]  but this may not receive immediate response,  please page for urgent matters!  If 7PM-7AM, please contact night-coverage [email or web address]  Dictation software was used to generate the above note. Typos may occur and escape review, as with typed/written notes. Please contact Dr Sheppard Coil directly for clarity if needed.

## 2021-10-29 NOTE — Progress Notes (Signed)
  Subjective: 4 Days Post-Op Procedure(s) (LRB): INTRAMEDULLARY (IM) NAIL INTERTROCHANTERIC (Left) Patient reports pain as mild to moderate this morning.  C/o CP, which is being addressed.   Patient is well, and has had no acute complaints or problems.  Slept off and on this AM.  In ICU for Afib.  Cardiology consulted.  Still on Neo drip off and on. Plan is to go Skilled nursing facility after hospital stay. Negative for chest pain and shortness of breath Fever: no Gastrointestinal:Negative for nausea and vomiting  Objective: Vital signs in last 24 hours: Temp:  [97.6 F (36.4 C)-98.3 F (36.8 C)] 98 F (36.7 C) (09/09 0400) Pulse Rate:  [33-142] 89 (09/09 0700) Resp:  [10-45] 27 (09/09 0700) BP: (56-140)/(46-112) 109/69 (09/09 0700) SpO2:  [87 %-100 %] 100 % (09/09 0700)  Intake/Output from previous day:  Intake/Output Summary (Last 24 hours) at 10/29/2021 0742 Last data filed at 10/29/2021 0700 Gross per 24 hour  Intake 949.96 ml  Output 3475 ml  Net -2525.04 ml    Intake/Output this shift: No intake/output data recorded.  Labs: Recent Labs    10/27/21 0606 10/27/21 2125 10/28/21 0402 10/29/21 0415  HGB 7.5* 8.2* 8.3* 8.3*   Recent Labs    10/28/21 0402 10/29/21 0415  WBC 6.4 8.3  RBC 2.71* 2.75*  HCT 24.7* 24.7*  PLT 89* 131*   Recent Labs    10/28/21 0402 10/29/21 0415  NA 139 140  K 3.9 4.0  CL 116* 109  CO2 22 24  BUN 19 21  CREATININE 0.98 1.13  GLUCOSE 122* 123*  CALCIUM 8.0* 8.3*   No results for input(s): "LABPT", "INR" in the last 72 hours.    EXAM General - Patient is Alert and Oriented Extremity - Sensation intact distally Dorsiflexion/Plantar flexion intact Compartment soft Dressing/Incision - clean, dry, scant proximal drainage.  Proximal dressing changed in the AM Motor Function - intact, moving foot and toes well on exam. Ambulated with assistance to chair.  Past Medical History:  Diagnosis Date   Anemia    BPH (benign  prostatic hyperplasia)    CHF (congestive heart failure) (HCC)    Diabetes mellitus without complication (HCC)    Dysphagia    GERD (gastroesophageal reflux disease)    HLD (hyperlipidemia)    HTN (hypertension)    Irregular heart beat    Nocturia    Urinary retention     Assessment/Plan: 4 Days Post-Op Procedure(s) (LRB): INTRAMEDULLARY (IM) NAIL INTERTROCHANTERIC (Left) Principal Problem:   Closed displaced intertrochanteric fracture of left femur (HCC) Active Problems:   Depression   Diastolic CHF, chronic (HCC)   COPD (chronic obstructive pulmonary disease) (HCC)   Atrial fibrillation, chronic (HCC)   Postoperative atrial fibrillation w/ RVR and associated hypotension requiring vasopressor support in ICU   Hypotension after procedure   Malnutrition of moderate degree  Estimated body mass index is 28.45 kg/m as calculated from the following:   Height as of this encounter: 6\' 1"  (1.854 m).   Weight as of this encounter: 97.8 kg. Advance diet Up with therapy  Discharge planning with Care Management.  Follow up at [facility] in 2 weeks for xrays and staple removal.  Prescriptions in the chart.  DVT Prophylaxis - Lovenox, Foot Pumps, and TED hose Weight-Bearing as tolerated to Left leg  [facility], PA-C Orthopaedic Surgery 10/29/2021, 7:42 AM

## 2021-10-29 NOTE — Plan of Care (Signed)
Continuing with plan of care. 

## 2021-10-30 DIAGNOSIS — F325 Major depressive disorder, single episode, in full remission: Secondary | ICD-10-CM | POA: Diagnosis not present

## 2021-10-30 DIAGNOSIS — I482 Chronic atrial fibrillation, unspecified: Secondary | ICD-10-CM | POA: Diagnosis not present

## 2021-10-30 DIAGNOSIS — J449 Chronic obstructive pulmonary disease, unspecified: Secondary | ICD-10-CM | POA: Diagnosis not present

## 2021-10-30 DIAGNOSIS — S72142A Displaced intertrochanteric fracture of left femur, initial encounter for closed fracture: Secondary | ICD-10-CM | POA: Diagnosis not present

## 2021-10-30 LAB — HEMOGLOBIN AND HEMATOCRIT, BLOOD
HCT: 29.4 % — ABNORMAL LOW (ref 39.0–52.0)
Hemoglobin: 9.8 g/dL — ABNORMAL LOW (ref 13.0–17.0)

## 2021-10-30 LAB — PREPARE RBC (CROSSMATCH)

## 2021-10-30 MED ORDER — SODIUM CHLORIDE 0.9% IV SOLUTION
Freq: Once | INTRAVENOUS | Status: AC
Start: 2021-10-30 — End: 2021-10-30

## 2021-10-30 MED ORDER — MIDODRINE HCL 5 MG PO TABS
10.0000 mg | ORAL_TABLET | Freq: Three times a day (TID) | ORAL | Status: DC
  Administered 2021-10-30 – 2021-11-04 (×15): 10 mg via ORAL
  Filled 2021-10-30 (×15): qty 2

## 2021-10-30 NOTE — Progress Notes (Signed)
PT Cancellation Note  Patient Details Name: Jeremy Herrera MRN: [id number] DOB: 09/27/1940   Cancelled Treatment:    Reason Eval/Treat Not Completed: Medical issues which prohibited therapy  Checked with RN prior to session.  Held per RN request.  Will continue as appropriate tomorrow.   Danielle Dess 10/30/2021, 1:12 PM

## 2021-10-30 NOTE — Progress Notes (Addendum)
PROGRESS NOTE    Jeremy Herrera   [id number] DOB: Jan 29, 1941  DOA: 10/25/2021 Date of Service: 10/30/21 PCP: Sofie Hartigan, MD     Brief Narrative / Hospital Course:  Jeremy Herrera is a 81 y.o. male with medical history significant for chronic diastolic dysfunction CHF, hypertension, dyslipidemia, A-fib (BB, Plavix, ASA - NOT on anticoagulation), BPH who presents to the ER from home on 10/25/2021 via EMS for evaluation of left hip pain following a mechanical fall. No syncope. Chronic SOB. Uses roller walker at baseline.  09/05: X-ray of the left hip shows comminuted, displaced and impacted intratrochanteric fractures of the left hip. Status post right hip total arthroplasty. No displaced fracture or dislocation of the bony pelvis. CT chest: interval development of multiple patchy airspace opacities in the right lung which may represent multifocal inflammation or pneumonia, or potentially severe masslike fibrosis secondary to chronic scarring or inflammation. 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 Orthopedic surgery performed intramedullary nailing of L femur with cephalomedullary device. Postoperative Afib RVR requiring vasopressor support, ICU consulted and maintaining phenylephrine gtt.  09/06: tachycardiac and soft BP overnight improving, tachypnea about the same. Hgb slight drop to 8.9 postop. Received albumin overnight. Still on phenylephrine this morning.  09/07: BP improved and HR up, low dose IV lopressor given but BP dropped.  Continues to have significant dyspnea though he states this is about at baseline.  Echocardiogram ordered. Cardiology consulted: Recommending continuing to treat causes increased adrenergic tone including pain/anemia, wean phenylephrine as tolerated, consider repeat CXR and transfusion PRBC, okay for continuing metoprolol 12.5 mg twice daily, echocardiogram pending.  Given anemia, patient was scanned for rule out retroperitoneal bleed, no concerns  there.  Was amenable to receiving 1 unit PRBC. 09/08: Remains tachycardic/tachypneic, this morning requiring 2 LNC.  Hgb up to 8.3.  CXR this a.m. Increased bilateral lung opacities are noted concerning for edema or atypical inflammation.  Stopped IV fluids, IV Lasix 40 mg x 2, increase metoprolol to tartrate 12.5 mg to 3 times daily, echocardiogram showed EF 60-65, normal LV function, no RWMA, diastolic parameters indeterminate.  Remains off pressors but blood pressure is low at 77/53 late this morning.  09/09: Pressors restarted overnight, midodrine also added by night coverage.  09/10: remains on phenylephrine. Midodrine increased. Still SOB so will try another unit PRBC.   Consultants:  Orthopedics (Dr. Leim Fabry) Cardiology [facility], Dr. Nehemiah Massed)  Procedures: 09/05: Repair L hip fracture (intramedullary nailing of L femur with cephalomedullary device)      ASSESSMENT & PLAN:   Principal Problem:   Closed displaced intertrochanteric fracture of left femur ([place]) Active Problems:   Depression   Diastolic CHF, chronic (HCC)   COPD (chronic obstructive pulmonary disease) ([place])   Atrial fibrillation, chronic (HCC)   Postoperative atrial fibrillation w/ RVR and associated hypotension requiring vasopressor support in ICU  Closed displaced intertrochanteric fracture of left femur ([place]) Status post mechanical fall with  closed intertrochanteric fracture of left femur Orthopedic surgery following, today 10/29/21 he is POD4 s/p Repair L hip fracture (intramedullary nailing of L femur with cephalomedullary device) morphine IV, robaxin and will start acetaminophen, consider oxycodone if pain not well controlled  Postoperative atrial fibrillation w/ RVR and associated hypotension requiring vasopressor support in ICU Patient not on long-term anticoagulation therapy but has a Watchman device in place Metoprolol was held for surgery, restarted metoprolol tartrate 12.5 mg twice  daily Initially thought likely hypovolemia however did not seem to be improving with fluid resuscitation  500 mL IV fluids given postop (caution w/ Hx HFpEF) Requiring phenylephrine gtt --> was off this yesterday but back on again this morning Midodrine added 10/29/2021 Low dose IV beta blocker for RVR, taking care w/ hypotension Holding other cardiac and anti-hypertensive meds d/t hypotension Telemetry Echocardiogram EF 60-65, normal LV function, no RWMA, diastolic parameters indeterminate Cardiology consult:  Recommending continuing to treat causes increased adrenergic tone including pain/anemia,  wean phenylephrine as tolerated -he has been off of this since yesterday, repeat CXR which showed increased vascular congestion and lasix was ordered today 09/08  transfusion 1 unit PRBC 09/07 metoprolol 12.5 mg twice daily 09/07 --> increased to tid 09/08 Difficulty staying off pressors - ordered another unit PRBC for symptomatic anemia 10/30/21 --> if this isn't helping  appreciate cardiology recs re any additional interventions,   Diastolic CHF, chronic ([place]) - does not appear to be in acute exacerbation Patient has a history of chronic diastolic dysfunction CHF with last known 2D echocardiogram showed an LVEF of 50% from 2022 Chest x-ray in ED showed findings suggestive of pulmonary vascular congestion, received 1 dose of Lasix 20 mg IV, improved but then recurred based on CXR morning of 10/28/2021, received another round of Lasix. Restart lisinopril when BP allows Echocardiogram as above cardiology consult as above  Depression Continue sertraline  COPD (chronic obstructive pulmonary disease) ([place]) Stable and not acutely exacerbated though there is concern for chronic inflammation/ILD See CT chest results - likely needing f/u outpatient  Breathing has been at baseline  Continue as needed bronchodilator therapy as well as inhaled steroids  Anemia Uncertain etiology but significant drop Hgb  seems more than shoule expect postop Hemoglobin has been stable greater than 8 since transfusion CT abd/pelvis to eval spontaneous retroperitoneal bleed 09/07- negative 1 unit PRBC 09/07 Hemoccult pending Consider GI consult       DVT prophylaxis: SCD Pertinent IV fluids/nutrition: none Central lines / invasive devices: n/a  Code Status: DNR Family Communication: None at this time, will call with any urgent updates  Disposition: inpatient for now [facility] needs: likely will need rehab SNF  Barriers to discharge / significant pending items: stabilization of Afib, likely needs placement, anticipate d/c once able to come off pressors and BP/HR maintaining but has been persistently hypotensive, tachycardic, tachypneic.                    Subjective:  Patient reports feeling more tired today. RN has concerns re: dysphagia will adjust diet.        Objective:  Vitals:   10/30/21 0700 10/30/21 0730 10/30/21 0800 10/30/21 0830  BP: 121/74 105/63  (!) 109/58  Pulse: (!) 101 (!) 107 (!) 117 (!) 110  Resp: (!) 26 (!) 30 (!) 21 (!) 25  Temp:   98.4 F (36.9 C)   TempSrc:      SpO2: 96% 96% 99% 99%  Weight:      Height:        Intake/Output Summary (Last 24 hours) at 10/30/2021 0903 Last data filed at 10/30/2021 0856 Gross per 24 hour  Intake 831.38 ml  Output 1050 ml  Net -218.62 ml   Filed Weights   10/25/21 1111 10/25/21 1525 10/25/21 2105  Weight: 102.3 kg 93 kg 97.8 kg    Examination:  Constitutional:  VS as above General Appearance: alert, well-developed, well-nourished, NAD Respiratory: Conversational dyspnea No wheeze No rhonchi No rales Cardiovascular: S1/S2 normal Irreg / tachycardic  No murmur No rub/gallop auscultated No JVD No  lower extremity edema Gastrointestinal: No tenderness Musculoskeletal:  No clubbing/cyanosis of digits Neurological: No cranial nerve deficit on limited exam Alert Psychiatric: Normal  judgment/insight Pleasant mood and affect       Scheduled Medications:   sodium chloride   Intravenous Once   acetaminophen  1,000 mg Oral Q8H   Chlorhexidine Gluconate Cloth  6 each Topical Q0600   cyanocobalamin  1,000 mcg Per Tube Daily   docusate sodium  100 mg Oral BID   donepezil  5 mg Per Tube QHS   feeding supplement  237 mL Oral TID BM   ferrous sulfate  325 mg Oral Daily   latanoprost  1 drop Both Eyes QHS   metoprolol tartrate  12.5 mg Oral TID   midodrine  5 mg Oral TID WC   multivitamin with minerals  1 tablet Oral Daily   pantoprazole  40 mg Oral BID AC   rosuvastatin  10 mg Oral Daily   senna  1 tablet Oral BID   sertraline  25 mg Oral Daily   umeclidinium-vilanterol  1 puff Inhalation Daily    Continuous Infusions:  methocarbamol (ROBAXIN) IV     phenylephrine (NEO-SYNEPHRINE) Adult infusion 25 mcg/min (10/30/21 0856)   potassium chloride Stopped (10/29/21 1057)    PRN Medications:  bisacodyl, HYDROmorphone (DILAUDID) injection, ketoconazole, methocarbamol **OR** methocarbamol (ROBAXIN) IV, metoCLOPramide **OR** metoCLOPramide (REGLAN) injection, metoprolol tartrate, nitroGLYCERIN, ondansetron **OR** ondansetron (ZOFRAN) IV, oxyCODONE, oxyCODONE, senna-docusate, sodium phosphate, traMADol  Antimicrobials:  Anti-infectives (From admission, onward)    Start     Dose/Rate Route Frequency Ordered Stop   10/26/21 1106  ceFAZolin (ANCEF) IVPB 2g/100 mL premix        2 g 200 mL/hr over 30 Minutes Intravenous Every 6 hours 10/26/21 1106 10/27/21 0040   10/25/21 1516  ceFAZolin (ANCEF) 2-4 GM/100ML-% IVPB       Note to Pharmacy: Guadalupe Dawn B: cabinet override      10/25/21 1516 10/27/21 0040   10/25/21 1300  ceFAZolin (ANCEF) IVPB 2g/100 mL premix        2 g 200 mL/hr over 30 Minutes Intravenous  Once 10/25/21 1252 10/25/21 1717       Data Reviewed: I have personally reviewed following labs and imaging studies  CBC: Recent Labs  Lab  10/25/21 1125 10/25/21 2014 10/26/21 0419 10/27/21 0606 10/27/21 2125 10/28/21 0402 10/29/21 0415  WBC 7.3  --  10.9* 6.0  --  6.4 8.3  NEUTROABS 5.1  --   --   --   --   --   --   HGB 13.1   < > 8.9* 7.5* 8.2* 8.3* 8.3*  HCT 39.7   < > 26.1* 22.4* 24.5* 24.7* 24.7*  MCV 92.1  --  91.6 92.6  --  91.1 89.8  PLT 154  --  148* 105*  --  89* 131*   < > = values in this interval not displayed.   Basic Metabolic Panel: Recent Labs  Lab 10/25/21 1125 10/26/21 0419 10/27/21 0606 10/27/21 1504 10/28/21 0402 10/29/21 0415  NA 143 144 139  --  139 140  K 3.8 4.8 4.3  --  3.9 4.0  CL 111 117* 113*  --  116* 109  CO2 25 23 22   --  22 24  GLUCOSE 130* 148* 133*  --  122* 123*  BUN 22 22 19   --  19 21  CREATININE 1.03 0.97 1.12  --  0.98 1.13  CALCIUM 8.9 8.1* 7.9*  --  8.0* 8.3*  MG 2.1 1.7  --  2.0 2.0  --   PHOS  --  3.2  --   --   --   --    GFR: Estimated Creatinine Clearance: 62.1 mL/min (by C-G formula based on SCr of 1.13 mg/dL). Liver Function Tests: Recent Labs  Lab 10/25/21 2324  ALBUMIN 2.8*   No results for input(s): "LIPASE", "AMYLASE" in the last 168 hours. No results for input(s): "AMMONIA" in the last 168 hours. Coagulation Profile: Recent Labs  Lab 10/25/21 1125  INR 1.0   Cardiac Enzymes: No results for input(s): "CKTOTAL", "CKMB", "CKMBINDEX", "TROPONINI" in the last 168 hours. BNP (last 3 results) No results for input(s): "PROBNP" in the last 8760 hours. HbA1C: No results for input(s): "HGBA1C" in the last 72 hours. CBG: Recent Labs  Lab 10/25/21 1906 10/25/21 2104  GLUCAP 125* 155*   Lipid Profile: No results for input(s): "CHOL", "HDL", "LDLCALC", "TRIG", "CHOLHDL", "LDLDIRECT" in the last 72 hours. Thyroid Function Tests: No results for input(s): "TSH", "T4TOTAL", "FREET4", "T3FREE", "THYROIDAB" in the last 72 hours. Anemia Panel: No results for input(s): "VITAMINB12", "FOLATE", "FERRITIN", "TIBC", "IRON", "RETICCTPCT" in the last 72  hours. Urine analysis:    Component Value Date/Time   COLORURINE YELLOW (A) 03/31/2018 1203   APPEARANCEUR CLEAR (A) 03/31/2018 1203   APPEARANCEUR Clear 04/02/2013 1415   LABSPEC 1.020 03/31/2018 1203   LABSPEC 1.015 04/02/2013 1415   PHURINE 5.0 03/31/2018 1203   GLUCOSEU NEGATIVE 03/31/2018 1203   GLUCOSEU Negative 04/02/2013 1415   HGBUR MODERATE (A) 03/31/2018 1203   BILIRUBINUR NEGATIVE 03/31/2018 1203   BILIRUBINUR Negative 04/02/2013 1415   KETONESUR NEGATIVE 03/31/2018 1203   PROTEINUR NEGATIVE 03/31/2018 1203   NITRITE NEGATIVE 03/31/2018 1203   LEUKOCYTESUR TRACE (A) 03/31/2018 1203   LEUKOCYTESUR Negative 04/02/2013 1415   Sepsis Labs: @LABRCNTIP (procalcitonin:4,lacticidven:4)  Recent Results (from the past 240 hour(s))  MRSA Next Gen by PCR, Nasal     Status: None   Collection Time: 10/25/21  9:07 PM   Specimen: Nasal Mucosa; Nasal Swab  Result Value Ref Range Status   MRSA by PCR Next Gen NOT DETECTED NOT DETECTED Final    Comment: (NOTE) The GeneXpert MRSA Assay (FDA approved for NASAL specimens only), is one component of a comprehensive MRSA colonization surveillance program. It is not intended to diagnose MRSA infection nor to guide or monitor treatment for MRSA infections. Test performance is not FDA approved in patients less than 97 years old. Performed at [facility], [address] [place], [place] [address]          Radiology Studies: DG HIP UNILAT WITH PELVIS 2-3 VIEWS LEFT  Result Date: 10/25/2021 CLINICAL DATA:  ORIF LEFT femur EXAM: DG HIP (WITH OR WITHOUT PELVIS) 2-3V LEFT COMPARISON:  10/25/2021 Fluoroscopy time: 3 minutes 47 seconds Dose: Not provided; please refer to operative records Images: 6 FINDINGS: Osseous demineralization. IM nail with 2 compression screws placed across an intertrochanteric fracture LEFT femur. Significant improvement in fracture fragment alignment since previous exam. Distal locking screw present.  IMPRESSION: Post nailing of intertrochanteric fracture LEFT femur. Electronically Signed   By: Lavonia Dana M.D.   On: 10/25/2021 18:42   DG C-Arm 1-60 Min-No Report  Result Date: 10/25/2021 Fluoroscopy was utilized by the requesting physician.  No radiographic interpretation.   CT CHEST WO CONTRAST  Result Date: 10/25/2021 CLINICAL DATA:  Fall.  Pleural effusion. EXAM: CT CHEST WITHOUT CONTRAST TECHNIQUE: Multidetector CT imaging of the chest was performed  following the standard protocol without IV contrast. RADIATION DOSE REDUCTION: This exam was performed according to the departmental dose-optimization program which includes automated exposure control, adjustment of the mA and/or kV according to patient size and/or use of iterative reconstruction technique. COMPARISON:  Radiograph of same day.  CT scan of Jul 04, 2017. FINDINGS: Cardiovascular: Atherosclerosis of thoracic aorta is noted without aneurysm formation. Aortic valves calcifications are noted. Normal cardiac size. No pericardial effusion. Mild coronary artery calcifications are noted. Mediastinum/Nodes: No enlarged mediastinal or axillary lymph nodes. Thyroid gland, trachea, and esophagus demonstrate no significant findings. Lungs/Pleura: No pneumothorax or pleural effusion is noted. Reticular densities are noted peripherally in both lungs suggesting chronic interstitial lung disease. Interval development of 6 mm nodule seen in left lower lobe best seen on image number 121 of series 3. Multiple patchy airspace opacities are noted in the right lung which may represent multifocal inflammation, or potentially severe fibrosis secondary to chronic scarring or inflammation. Some degree of mosaic pattern is seen in the left lung suggesting multifocal inflammation or air trapping secondary to small airways disease. Some degree of bronchiectasis is noted in both lower lobes. Upper Abdomen: No acute abnormality. Musculoskeletal: No chest wall mass or  suspicious bone lesions identified. IMPRESSION: There is interval development of multiple patchy airspace opacities in the right lung which may represent multifocal inflammation or pneumonia, or potentially severe masslike fibrosis secondary to chronic scarring or inflammation. Some degree of mosaic pattern is also noted in the left lung suggesting multifocal inflammation or air trapping secondary to small airways disease. There are again noted reticular densities noted peripherally throughout both lungs suggesting chronic interstitial lung disease, as well as some degree of bronchiectasis seen in both lower lobes, most consistent with chronic findings. Interval development of small nodule seen in left lower lobe. Non-contrast chest CT at 6-12 months is recommended. If the nodule is stable at time of repeat CT, then future CT at 18-24 months (from today's scan) is considered optional for low-risk patients, but is recommended for high-risk patients. This recommendation follows the consensus statement: Guidelines for Management of Incidental Pulmonary Nodules Detected on CT Images: From the Fleischner Society 2017; Radiology 2017; 284:228-243. Mild coronary artery calcifications are noted. Aortic valve calcifications are noted. Aortic Atherosclerosis (ICD10-I70.0). Electronically Signed   By: Lupita Raider M.D.   On: 10/25/2021 14:56   DG Chest 1 View  Result Date: 10/25/2021 CLINICAL DATA:  Fall EXAM: CHEST  1 VIEW COMPARISON:  Radiograph 05/16/2019 FINDINGS: Enlarged cardiac silhouette. Appendage occlusion device noted. There is pulmonary vascular congestion with interstitial prominent bilaterally, left greater than right. No pleural effusion. No pneumothorax. No acute osseous abnormality identified on frontal radiographs of the chest. IMPRESSION: Cardiomegaly with pulmonary vascular congestion and interstitial prominence bilaterally, left greater than right, which could reflect a degree of interstitial edema.  Electronically Signed   By: Caprice Renshaw M.D.   On: 10/25/2021 11:57   DG Hip Unilat With Pelvis 2-3 Views Left  Result Date: 10/25/2021 CLINICAL DATA:  Fall, pain EXAM: DG HIP (WITH OR WITHOUT PELVIS) 2-3V LEFT COMPARISON:  None Available. FINDINGS: Osteopenia. Comminuted, displaced and impacted intratrochanteric fractures of the left hip. Status post right hip total arthroplasty. No displaced fracture or dislocation of the bony pelvis. Nonobstructive pattern of overlying bowel gas. IMPRESSION: 1. Comminuted, displaced and impacted intratrochanteric fractures of the left hip. 2. Status post right hip total arthroplasty. 3. No displaced fracture or dislocation of the bony pelvis. Electronically Signed   By:  Delanna Ahmadi M.D.   On: 10/25/2021 11:52    No results found.         LOS: 5 days       Emeterio Reeve, DO [facility] 10/30/2021, 9:03 AM   Staff may message me via secure chat in [place]  but this may not receive immediate response,  please page for urgent matters!  If 7PM-7AM, please contact night-coverage [email or web address]  Dictation software was used to generate the above note. Typos may occur and escape review, as with typed/written notes. Please contact Dr Sheppard Coil directly for clarity if needed.

## 2021-10-30 NOTE — Plan of Care (Signed)
Continuing with plan of care. 

## 2021-10-30 NOTE — Progress Notes (Signed)
  Subjective: 5 Days Post-Op Procedure(s) (LRB): INTRAMEDULLARY (IM) NAIL INTERTROCHANTERIC (Left) Patient reports pain as mild to moderate this morning.  C/o CP, which is being addressed.   Patient is well, and has had no acute complaints or problems.  Slept off and on this AM.  In ICU for Afib.  Cardiology consulted.  Still on Neo drip off and on. Plan is to go Skilled nursing facility after hospital stay. Negative for chest pain and shortness of breath Fever: no Gastrointestinal:Negative for nausea and vomiting  Objective: Vital signs in last 24 hours: Temp:  [97.6 F (36.4 C)-98 F (36.7 C)] 97.8 F (36.6 C) (09/10 0400) Pulse Rate:  [65-140] 102 (09/10 0600) Resp:  [17-44] 24 (09/10 0600) BP: (73-144)/(45-108) 116/72 (09/10 0600) SpO2:  [97 %-100 %] 100 % (09/10 0500)  Intake/Output from previous day:  Intake/Output Summary (Last 24 hours) at 10/30/2021 0638 Last data filed at 10/30/2021 0600 Gross per 24 hour  Intake 780.16 ml  Output 1225 ml  Net -444.84 ml    Intake/Output this shift: Total I/O In: 361.5 [P.O.:100; I.V.:261.5] Out: 450 [Urine:450]  Labs: Recent Labs    10/27/21 2125 10/28/21 0402 10/29/21 0415  HGB 8.2* 8.3* 8.3*   Recent Labs    10/28/21 0402 10/29/21 0415  WBC 6.4 8.3  RBC 2.71* 2.75*  HCT 24.7* 24.7*  PLT 89* 131*   Recent Labs    10/28/21 0402 10/29/21 0415  NA 139 140  K 3.9 4.0  CL 116* 109  CO2 22 24  BUN 19 21  CREATININE 0.98 1.13  GLUCOSE 122* 123*  CALCIUM 8.0* 8.3*   No results for input(s): "LABPT", "INR" in the last 72 hours.    EXAM General - Patient is Alert and Oriented Extremity - Sensation intact distally Dorsiflexion/Plantar flexion intact Compartment soft Dressing/Incision - clean, dry, scant proximal drainage.  Proximal dressing changed in the AM Motor Function - intact, moving foot and toes well on exam. Ambulated with assistance to chair.  Past Medical History:  Diagnosis Date   Anemia     BPH (benign prostatic hyperplasia)    CHF (congestive heart failure) (HCC)    Diabetes mellitus without complication (HCC)    Dysphagia    GERD (gastroesophageal reflux disease)    HLD (hyperlipidemia)    HTN (hypertension)    Irregular heart beat    Nocturia    Urinary retention     Assessment/Plan: 5 Days Post-Op Procedure(s) (LRB): INTRAMEDULLARY (IM) NAIL INTERTROCHANTERIC (Left) Principal Problem:   Closed displaced intertrochanteric fracture of left femur (HCC) Active Problems:   Depression   Diastolic CHF, chronic (HCC)   COPD (chronic obstructive pulmonary disease) (HCC)   Atrial fibrillation, chronic (HCC)   Postoperative atrial fibrillation w/ RVR and associated hypotension requiring vasopressor support in ICU   Hypotension after procedure   Malnutrition of moderate degree  Estimated body mass index is 28.45 kg/m as calculated from the following:   Height as of this encounter: 6\' 1"  (1.854 m).   Weight as of this encounter: 97.8 kg. Advance diet Up with therapy  Discharge planning with Care Management.  Follow up at [facility] in 2 weeks for xrays and staple removal.  Prescriptions in the chart.  DVT Prophylaxis - Lovenox, Foot Pumps, and TED hose Weight-Bearing as tolerated to Left leg  [facility], PA-C Orthopaedic Surgery 10/30/2021, 6:38 AM

## 2021-10-31 DIAGNOSIS — F325 Major depressive disorder, single episode, in full remission: Secondary | ICD-10-CM | POA: Diagnosis not present

## 2021-10-31 DIAGNOSIS — I482 Chronic atrial fibrillation, unspecified: Secondary | ICD-10-CM | POA: Diagnosis not present

## 2021-10-31 DIAGNOSIS — J449 Chronic obstructive pulmonary disease, unspecified: Secondary | ICD-10-CM | POA: Diagnosis not present

## 2021-10-31 DIAGNOSIS — S72142A Displaced intertrochanteric fracture of left femur, initial encounter for closed fracture: Secondary | ICD-10-CM | POA: Diagnosis not present

## 2021-10-31 LAB — BPAM RBC
Blood Product Expiration Date: 202310162359
ISSUE DATE / TIME: 202309101253
Unit Type and Rh: 5100

## 2021-10-31 LAB — TYPE AND SCREEN
ABO/RH(D): O POS
Antibody Screen: NEGATIVE
Unit division: 0

## 2021-10-31 NOTE — Progress Notes (Signed)
PROGRESS NOTE    Jeremy Herrera   [id number] DOB: 1940/09/29  DOA: 10/25/2021 Date of Service: 10/31/21 PCP: Sofie Hartigan, MD     Brief Narrative / Hospital Course:  Jeremy Herrera is a 81 y.o. male with medical history significant for chronic diastolic dysfunction CHF, hypertension, dyslipidemia, A-fib (BB, Plavix, ASA - NOT on anticoagulation), BPH who presents to the ER from home on 10/25/2021 via EMS for evaluation of left hip pain following a mechanical fall. No syncope. Chronic SOB. Uses roller walker at baseline.  09/05: X-ray of the left hip shows comminuted, displaced and impacted intratrochanteric fractures of the left hip. Status post right hip total arthroplasty. No displaced fracture or dislocation of the bony pelvis. CT chest: interval development of multiple patchy airspace opacities in the right lung which may represent multifocal inflammation or pneumonia, or potentially severe masslike fibrosis secondary to chronic scarring or inflammation. 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 Orthopedic surgery performed intramedullary nailing of L femur with cephalomedullary device. Postoperative Afib RVR requiring vasopressor support, ICU consulted and maintaining phenylephrine gtt.  09/06: tachycardiac and soft BP overnight improving, tachypnea about the same. Hgb slight drop to 8.9 postop. Received albumin overnight. Still on phenylephrine this morning.  09/07: BP improved and HR up, low dose IV lopressor given but BP dropped.  Continues to have significant dyspnea though he states this is about at baseline.  Echocardiogram ordered. Cardiology consulted: Recommending continuing to treat causes increased adrenergic tone including pain/anemia, wean phenylephrine as tolerated, consider repeat CXR and transfusion PRBC, okay for continuing metoprolol 12.5 mg twice daily, echocardiogram pending.  Given anemia, patient was scanned for rule out retroperitoneal bleed, no concerns  there.  Was amenable to receiving 1 unit PRBC. 09/08: Remains tachycardic/tachypneic, this morning requiring 2 LNC.  Hgb up to 8.3.  CXR this a.m. Increased bilateral lung opacities are noted concerning for edema or atypical inflammation.  Stopped IV fluids, IV Lasix 40 mg x 2, increase metoprolol to tartrate 12.5 mg to 3 times daily, echocardiogram showed EF 60-65, normal LV function, no RWMA, diastolic parameters indeterminate.  Remains off pressors but blood pressure is low at 77/53 late this morning.  09/09: Pressors restarted overnight, midodrine also added by night coverage.  09/10: remains on phenylephrine. Midodrine increased. Still SOB so will try another unit PRBC. Per RN he seemed to perk up a bit w/ another unit 09/11: still remains on phenylephrine.   Consultants:  Orthopedics (Dr. Leim Fabry) Cardiology [facility], Dr. Nehemiah Massed)  Procedures: 09/05: Repair L hip fracture (intramedullary nailing of L femur with cephalomedullary device)      ASSESSMENT & PLAN:   Principal Problem:   Closed displaced intertrochanteric fracture of left femur ([place]) Active Problems:   Depression   Diastolic CHF, chronic (HCC)   COPD (chronic obstructive pulmonary disease) ([place])   Atrial fibrillation, chronic (HCC)   Postoperative atrial fibrillation w/ RVR and associated hypotension requiring vasopressor support in ICU  Closed displaced intertrochanteric fracture of left femur ([place]) Status post mechanical fall with  closed intertrochanteric fracture of left femur Orthopedic surgery following, today 10/29/21 he is POD4 s/p Repair L hip fracture (intramedullary nailing of L femur with cephalomedullary device) morphine IV, robaxin and will start acetaminophen, consider oxycodone if pain not well controlled  Postoperative atrial fibrillation w/ RVR and associated hypotension requiring vasopressor support in ICU Patient not on long-term anticoagulation therapy but has a Watchman device in  place Metoprolol was held for surgery, restarted metoprolol tartrate 12.5  mg twice daily Initially thought likely hypovolemia however did not seem to be improving with fluid resuscitation 500 mL IV fluids given postop (caution w/ Hx HFpEF) Requiring phenylephrine gtt --> was off this yesterday but back on again this morning Midodrine added 10/29/2021 Low dose IV beta blocker for RVR, taking care w/ hypotension Holding other cardiac and anti-hypertensive meds d/t hypotension Telemetry Echocardiogram EF 60-65, normal LV function, no RWMA, diastolic parameters indeterminate Cardiology consult:  Recommending continuing to treat causes increased adrenergic tone including pain/anemia,  wean phenylephrine as tolerated -he has been off of this since yesterday, repeat CXR which showed increased vascular congestion and lasix was ordered today 09/08  transfusion 1 unit PRBC 09/07, 1 unit PRBC 09/10 metoprolol 12.5 mg twice daily 09/07 --> increased to tid 09/08 Difficulty staying off pressors - ordered another unit PRBC for symptomatic anemia 10/30/21 --> if this isn't helping  appreciate cardiology recs re any additional interventions/therapies   Diastolic CHF, chronic ([place]) - does not appear to be in acute exacerbation Patient has a history of chronic diastolic dysfunction CHF with last known 2D echocardiogram showed an LVEF of 50% from 2022 Chest x-ray in ED showed findings suggestive of pulmonary vascular congestion, received 1 dose of Lasix 20 mg IV, improved but then recurred based on CXR morning of 10/28/2021, received another round of Lasix. Restart lisinopril when BP allows Echocardiogram as above cardiology consult as above  Depression Continue sertraline  COPD (chronic obstructive pulmonary disease) ([place]) Stable and not acutely exacerbated though there is concern for chronic inflammation/ILD See CT chest results - likely needing f/u outpatient  Breathing has been at baseline  Continue as  needed bronchodilator therapy as well as inhaled steroids  Anemia Uncertain etiology but significant drop Hgb seems more than shoule expect postop Hemoglobin has been stable greater than 8 since transfusion CT abd/pelvis to eval spontaneous retroperitoneal bleed 09/07- negative 1 unit PRBC 09/07 Hemoccult pending Consider GI consult       DVT prophylaxis: SCD Pertinent IV fluids/nutrition: none Central lines / invasive devices: n/a  Code Status: DNR Family Communication: None at this time, will call with any urgent updates  Disposition: inpatient for now [facility] needs: likely will need rehab SNF  Barriers to discharge / significant pending items: stabilization of Afib, likely needs placement, anticipate d/c once able to come off pressors and BP/HR maintaining but has been persistently hypotensive, tachycardic, tachypneic.                    Subjective:  Patient resting in bed, no concerns.        Objective:  Vitals:   10/31/21 0430 10/31/21 0445 10/31/21 0500 10/31/21 0600  BP: 108/73 107/76 104/67 123/81  Pulse: 99 82 93 (!) 108  Resp: 12 (!) 30 20 (!) 27  Temp:      TempSrc:      SpO2:      Weight:      Height:        Intake/Output Summary (Last 24 hours) at 10/31/2021 1017 Last data filed at 10/31/2021 [id number] Gross per 24 hour  Intake 752.37 ml  Output 1001 ml  Net -248.63 ml    Filed Weights   10/25/21 1111 10/25/21 1525 10/25/21 2105  Weight: 102.3 kg 93 kg 97.8 kg    Examination:  Constitutional:  VS as above General Appearance: alert, well-developed, well-nourished, NAD Respiratory: Conversational dyspnea No wheeze No rhonchi No rales Cardiovascular: S1/S2 normal Irreg / tachycardic  No murmur No  rub/gallop auscultated No JVD No lower extremity edema Gastrointestinal: No tenderness Musculoskeletal:  No clubbing/cyanosis of digits Neurological: No cranial nerve deficit on limited exam Alert Psychiatric: Normal  judgment/insight Pleasant mood and affect       Scheduled Medications:   acetaminophen  1,000 mg Oral Q8H   Chlorhexidine Gluconate Cloth  6 each Topical Q0600   cyanocobalamin  1,000 mcg Per Tube Daily   docusate sodium  100 mg Oral BID   donepezil  5 mg Per Tube QHS   feeding supplement  237 mL Oral TID BM   ferrous sulfate  325 mg Oral Daily   latanoprost  1 drop Both Eyes QHS   metoprolol tartrate  12.5 mg Oral TID   midodrine  10 mg Oral TID WC   multivitamin with minerals  1 tablet Oral Daily   pantoprazole  40 mg Oral BID AC   rosuvastatin  10 mg Oral Daily   senna  1 tablet Oral BID   sertraline  25 mg Oral Daily   umeclidinium-vilanterol  1 puff Inhalation Daily    Continuous Infusions:  methocarbamol (ROBAXIN) IV     phenylephrine (NEO-SYNEPHRINE) Adult infusion Stopped (10/31/21 0449)   potassium chloride 10 mEq (10/31/21 0904)    PRN Medications:  bisacodyl, HYDROmorphone (DILAUDID) injection, ketoconazole, methocarbamol **OR** methocarbamol (ROBAXIN) IV, metoCLOPramide **OR** metoCLOPramide (REGLAN) injection, metoprolol tartrate, nitroGLYCERIN, ondansetron **OR** ondansetron (ZOFRAN) IV, oxyCODONE, oxyCODONE, senna-docusate, sodium phosphate, traMADol  Antimicrobials:  Anti-infectives (From admission, onward)    Start     Dose/Rate Route Frequency Ordered Stop   10/26/21 1106  ceFAZolin (ANCEF) IVPB 2g/100 mL premix        2 g 200 mL/hr over 30 Minutes Intravenous Every 6 hours 10/26/21 1106 10/27/21 0040   10/25/21 1516  ceFAZolin (ANCEF) 2-4 GM/100ML-% IVPB       Note to Pharmacy: Maryagnes Amos B: cabinet override      10/25/21 1516 10/27/21 0040   10/25/21 1300  ceFAZolin (ANCEF) IVPB 2g/100 mL premix        2 g 200 mL/hr over 30 Minutes Intravenous  Once 10/25/21 1252 10/25/21 1717       Data Reviewed: I have personally reviewed following labs and imaging studies  CBC: Recent Labs  Lab 10/25/21 1125 10/25/21 2014 10/26/21 0419  10/27/21 0606 10/27/21 2125 10/28/21 0402 10/29/21 0415 10/30/21 1701  WBC 7.3  --  10.9* 6.0  --  6.4 8.3  --   NEUTROABS 5.1  --   --   --   --   --   --   --   HGB 13.1   < > 8.9* 7.5* 8.2* 8.3* 8.3* 9.8*  HCT 39.7   < > 26.1* 22.4* 24.5* 24.7* 24.7* 29.4*  MCV 92.1  --  91.6 92.6  --  91.1 89.8  --   PLT 154  --  148* 105*  --  89* 131*  --    < > = values in this interval not displayed.    Basic Metabolic Panel: Recent Labs  Lab 10/25/21 1125 10/26/21 0419 10/27/21 0606 10/27/21 1504 10/28/21 0402 10/29/21 0415  NA 143 144 139  --  139 140  K 3.8 4.8 4.3  --  3.9 4.0  CL 111 117* 113*  --  116* 109  CO2 25 23 22   --  22 24  GLUCOSE 130* 148* 133*  --  122* 123*  BUN 22 22 19   --  19 21  CREATININE 1.03 0.97  1.12  --  0.98 1.13  CALCIUM 8.9 8.1* 7.9*  --  8.0* 8.3*  MG 2.1 1.7  --  2.0 2.0  --   PHOS  --  3.2  --   --   --   --     GFR: Estimated Creatinine Clearance: 62.1 mL/min (by C-G formula based on SCr of 1.13 mg/dL). Liver Function Tests: Recent Labs  Lab 10/25/21 2324  ALBUMIN 2.8*    No results for input(s): "LIPASE", "AMYLASE" in the last 168 hours. No results for input(s): "AMMONIA" in the last 168 hours. Coagulation Profile: Recent Labs  Lab 10/25/21 1125  INR 1.0    Cardiac Enzymes: No results for input(s): "CKTOTAL", "CKMB", "CKMBINDEX", "TROPONINI" in the last 168 hours. BNP (last 3 results) No results for input(s): "PROBNP" in the last 8760 hours. HbA1C: No results for input(s): "HGBA1C" in the last 72 hours. CBG: Recent Labs  Lab 10/25/21 1906 10/25/21 2104  GLUCAP 125* 155*    Lipid Profile: No results for input(s): "CHOL", "HDL", "LDLCALC", "TRIG", "CHOLHDL", "LDLDIRECT" in the last 72 hours. Thyroid Function Tests: No results for input(s): "TSH", "T4TOTAL", "FREET4", "T3FREE", "THYROIDAB" in the last 72 hours. Anemia Panel: No results for input(s): "VITAMINB12", "FOLATE", "FERRITIN", "TIBC", "IRON", "RETICCTPCT" in the  last 72 hours. Urine analysis:    Component Value Date/Time   COLORURINE YELLOW (A) 03/31/2018 1203   APPEARANCEUR CLEAR (A) 03/31/2018 1203   APPEARANCEUR Clear 04/02/2013 1415   LABSPEC 1.020 03/31/2018 1203   LABSPEC 1.015 04/02/2013 1415   PHURINE 5.0 03/31/2018 1203   GLUCOSEU NEGATIVE 03/31/2018 1203   GLUCOSEU Negative 04/02/2013 1415   HGBUR MODERATE (A) 03/31/2018 1203   BILIRUBINUR NEGATIVE 03/31/2018 1203   BILIRUBINUR Negative 04/02/2013 1415   KETONESUR NEGATIVE 03/31/2018 1203   PROTEINUR NEGATIVE 03/31/2018 1203   NITRITE NEGATIVE 03/31/2018 1203   LEUKOCYTESUR TRACE (A) 03/31/2018 1203   LEUKOCYTESUR Negative 04/02/2013 1415   Sepsis Labs: @LABRCNTIP (procalcitonin:4,lacticidven:4)  Recent Results (from the past 240 hour(s))  MRSA Next Gen by PCR, Nasal     Status: None   Collection Time: 10/25/21  9:07 PM   Specimen: Nasal Mucosa; Nasal Swab  Result Value Ref Range Status   MRSA by PCR Next Gen NOT DETECTED NOT DETECTED Final    Comment: (NOTE) The GeneXpert MRSA Assay (FDA approved for NASAL specimens only), is one component of a comprehensive MRSA colonization surveillance program. It is not intended to diagnose MRSA infection nor to guide or monitor treatment for MRSA infections. Test performance is not FDA approved in patients less than 78 years old. Performed at [facility], [address] [place], [place]          Radiology Studies: DG HIP UNILAT WITH PELVIS 2-3 VIEWS LEFT  Result Date: 10/25/2021 CLINICAL DATA:  ORIF LEFT femur EXAM: DG HIP (WITH OR WITHOUT PELVIS) 2-3V LEFT COMPARISON:  10/25/2021 Fluoroscopy time: 3 minutes 47 seconds Dose: Not provided; please refer to operative records Images: 6 FINDINGS: Osseous demineralization. IM nail with 2 compression screws placed across an intertrochanteric fracture LEFT femur. Significant improvement in fracture fragment alignment since previous exam. Distal locking screw  present. IMPRESSION: Post nailing of intertrochanteric fracture LEFT femur. Electronically Signed   By: 12/25/2021 M.D.   On: 10/25/2021 18:42   DG C-Arm 1-60 Min-No Report  Result Date: 10/25/2021 Fluoroscopy was utilized by the requesting physician.  No radiographic interpretation.   CT CHEST WO CONTRAST  Result Date: 10/25/2021 CLINICAL DATA:  Fall.  Pleural effusion. EXAM: CT CHEST WITHOUT CONTRAST TECHNIQUE: Multidetector CT imaging of the chest was performed following the standard protocol without IV contrast. RADIATION DOSE REDUCTION: This exam was performed according to the departmental dose-optimization program which includes automated exposure control, adjustment of the mA and/or kV according to patient size and/or use of iterative reconstruction technique. COMPARISON:  Radiograph of same day.  CT scan of Jul 04, 2017. FINDINGS: Cardiovascular: Atherosclerosis of thoracic aorta is noted without aneurysm formation. Aortic valves calcifications are noted. Normal cardiac size. No pericardial effusion. Mild coronary artery calcifications are noted. Mediastinum/Nodes: No enlarged mediastinal or axillary lymph nodes. Thyroid gland, trachea, and esophagus demonstrate no significant findings. Lungs/Pleura: No pneumothorax or pleural effusion is noted. Reticular densities are noted peripherally in both lungs suggesting chronic interstitial lung disease. Interval development of 6 mm nodule seen in left lower lobe best seen on image number 121 of series 3. Multiple patchy airspace opacities are noted in the right lung which may represent multifocal inflammation, or potentially severe fibrosis secondary to chronic scarring or inflammation. Some degree of mosaic pattern is seen in the left lung suggesting multifocal inflammation or air trapping secondary to small airways disease. Some degree of bronchiectasis is noted in both lower lobes. Upper Abdomen: No acute abnormality. Musculoskeletal: No chest wall mass  or suspicious bone lesions identified. IMPRESSION: There is interval development of multiple patchy airspace opacities in the right lung which may represent multifocal inflammation or pneumonia, or potentially severe masslike fibrosis secondary to chronic scarring or inflammation. Some degree of mosaic pattern is also noted in the left lung suggesting multifocal inflammation or air trapping secondary to small airways disease. There are again noted reticular densities noted peripherally throughout both lungs suggesting chronic interstitial lung disease, as well as some degree of bronchiectasis seen in both lower lobes, most consistent with chronic findings. Interval development of small nodule seen in left lower lobe. Non-contrast chest CT at 6-12 months is recommended. If the nodule is stable at time of repeat CT, then future CT at 18-24 months (from today's scan) is considered optional for low-risk patients, but is recommended for high-risk patients. This recommendation follows the consensus statement: Guidelines for Management of Incidental Pulmonary Nodules Detected on CT Images: From the Fleischner Society 2017; Radiology 2017; 284:228-243. Mild coronary artery calcifications are noted. Aortic valve calcifications are noted. Aortic Atherosclerosis (ICD10-I70.0). Electronically Signed   By: Marijo Conception M.D.   On: 10/25/2021 14:56   DG Chest 1 View  Result Date: 10/25/2021 CLINICAL DATA:  Fall EXAM: CHEST  1 VIEW COMPARISON:  Radiograph 05/16/2019 FINDINGS: Enlarged cardiac silhouette. Appendage occlusion device noted. There is pulmonary vascular congestion with interstitial prominent bilaterally, left greater than right. No pleural effusion. No pneumothorax. No acute osseous abnormality identified on frontal radiographs of the chest. IMPRESSION: Cardiomegaly with pulmonary vascular congestion and interstitial prominence bilaterally, left greater than right, which could reflect a degree of interstitial  edema. Electronically Signed   By: Maurine Simmering M.D.   On: 10/25/2021 11:57   DG Hip Unilat With Pelvis 2-3 Views Left  Result Date: 10/25/2021 CLINICAL DATA:  Fall, pain EXAM: DG HIP (WITH OR WITHOUT PELVIS) 2-3V LEFT COMPARISON:  None Available. FINDINGS: Osteopenia. Comminuted, displaced and impacted intratrochanteric fractures of the left hip. Status post right hip total arthroplasty. No displaced fracture or dislocation of the bony pelvis. Nonobstructive pattern of overlying bowel gas. IMPRESSION: 1. Comminuted, displaced and impacted intratrochanteric fractures of the left hip. 2. Status post right  hip total arthroplasty. 3. No displaced fracture or dislocation of the bony pelvis. Electronically Signed   By: Jearld Lesch M.D.   On: 10/25/2021 11:52    No results found.         LOS: 6 days       Sunnie Nielsen, DO [facility] 10/31/2021, 10:17 AM   Staff may message me via secure chat in Epic  but this may not receive immediate response,  please page for urgent matters!  If 7PM-7AM, please contact night-coverage [email or web address]  Dictation software was used to generate the above note. Typos may occur and escape review, as with typed/written notes. Please contact Dr Lyn Hollingshead directly for clarity if needed.

## 2021-10-31 NOTE — Progress Notes (Signed)
Physical Therapy Treatment Patient Details Name: Jeremy Herrera MRN: [id number] DOB: [birth year] Today's Date: 10/31/2021   History of Present Illness Pt is 58 YOM admitted for displaced intertrochanteric fx of L femur, s/p IM nailing, a-fib w RVR and hypotension. PMH includes: dCHF, depression, COPD, anemia, R THA, A-fib, HTN, HLD.    PT Comments    Pt presents to PT in bed and agreeable to participate in therapy services, but appears lethargic. Pt was pleasant and motivated to participate during the session and put forth good effort throughout. Pt requires significant physical assistance to perform sup<>sit transfers. Pt reports dizziness and lightheaded following sup>sit, BP reading shows >62mmHg drop in systolic BP. Pt returned to supine. RN notified. Pt tolerated 2-3 min of supported sitting EOB. Would benefit from skilled PT at SNF to address above deficits in strength, functional mobility, balance, and activity tolerance to promote optimal return to PLOF.    Recommendations for follow up therapy are one component of a multi-disciplinary discharge planning process, led by the attending physician.  Recommendations may be updated based on patient status, additional functional criteria and insurance authorization.  Follow Up Recommendations  Skilled nursing-short term rehab (<3 hours/day) Can patient physically be transported by private vehicle: No   Assistance Recommended at Discharge Frequent or constant Supervision/Assistance  Patient can return home with the following Two people to help with walking and/or transfers;Two people to help with bathing/dressing/bathroom;Help with stairs or ramp for entrance;Assist for transportation   Equipment Recommendations  None recommended by PT    Recommendations for Other Services       Precautions / Restrictions Precautions Precautions: Fall Restrictions Weight Bearing Restrictions: Yes LLE Weight Bearing: Weight bearing as tolerated      Mobility  Bed Mobility Overal bed mobility: Needs Assistance Bed Mobility: Supine to Sit, Sit to Supine     Supine to sit: Mod assist, +2 for physical assistance Sit to supine: Max assist, +2 for physical assistance        Transfers                   General transfer comment: unable/unsafe to attempt    Ambulation/Gait               General Gait Details: unable/unsafe to attempt   Stairs             Wheelchair Mobility    Modified Rankin (Stroke Patients Only)       Balance Overall balance assessment: History of Falls, Needs assistance Sitting-balance support: Bilateral upper extremity supported, Feet supported Sitting balance-Leahy Scale: Fair                                      Cognition Arousal/Alertness: Youth worker During Therapy: WFL for tasks assessed/performed Overall Cognitive Status: Within Functional Limits for tasks assessed                                 General Comments: difficult to understand, appears lethargic        Exercises Total Joint Exercises Ankle Circles/Pumps: AROM, Strengthening, Both, 10 reps, Supine Quad Sets: AROM, Strengthening, Left, 10 reps, AAROM    General Comments        Pertinent Vitals/Pain Pain Assessment Pain Assessment: No/denies pain Pain Location: LLE Pain Descriptors / Indicators: Grimacing Pain Intervention(s): Limited activity within patient's  tolerance, Monitored during session, Repositioned    Home Living                          Prior Function            PT Goals (current goals can now be found in the care plan section) Acute Rehab PT Goals Patient Stated Goal: to go home PT Goal Formulation: With patient Time For Goal Achievement: 11/09/21 Potential to Achieve Goals: Fair Progress towards PT goals: Not progressing toward goals - comment (Pt continues to become hypotensive w mobility and transfers)    Frequency     7X/week      PT Plan Current plan remains appropriate    Co-evaluation              AM-PAC PT "6 Clicks" Mobility   Outcome Measure  Help needed turning from your back to your side while in a flat bed without using bedrails?: A Lot Help needed moving from lying on your back to sitting on the side of a flat bed without using bedrails?: Total Help needed moving to and from a bed to a chair (including a wheelchair)?: Total Help needed standing up from a chair using your arms (e.g., wheelchair or bedside chair)?: Total Help needed to walk in hospital room?: Total Help needed climbing 3-5 steps with a railing? : Total 6 Click Score: 7    End of Session Equipment Utilized During Treatment: Oxygen Activity Tolerance: Patient limited by lethargy;Other (comment) (pt limited by orthostatics) Patient left: in bed;with call bell/phone within reach Nurse Communication: Mobility status PT Visit Diagnosis: Muscle weakness (generalized) (M62.81);Difficulty in walking, not elsewhere classified (R26.2);Unsteadiness on feet (R26.81);Pain Pain - Right/Left: Left Pain - part of body: Hip     Time: 0928-1000 PT Time Calculation (min) (ACUTE ONLY): 32 min  Charges:                       Odette Horns MPH, SPT 10/31/21, 12:20 PM

## 2021-10-31 NOTE — Progress Notes (Signed)
Occupational Therapy Treatment Patient Details Name: Jeremy Herrera MRN: [id number] DOB: 01-23-1941 Today's Date: 10/31/2021   History of present illness Pt is 51 YOM admitted for displaced intertrochanteric fx of L femur, s/p IM nailing, a-fib w RVR and hypotension. PMH includes: dCHF, depression, COPD, anemia, R THA, A-fib, HTN, HLD.   OT comments  Mr Roop was seen for OT treatment on this date. Upon arrival to room pt reclined in bed sleeping soundly, awakes to voice and defers mobility citing fatigue but agreeable to bed level exercises. Pt requires SETUP + SUPERVISION self-feeding/drinking at bed level - requires BUE to support cup. MAX A don/doff B socks bed level Completes exercises as described below. Instructed on importance of chair in bed position to impro ve BP. Pt making progress toward goals, will continue to follow POC. Discharge recommendation remains appropriate.     Recommendations for follow up therapy are one component of a multi-disciplinary discharge planning process, led by the attending physician.  Recommendations may be updated based on patient status, additional functional criteria and insurance authorization.    Follow Up Recommendations  Skilled nursing-short term rehab (<3 hours/day)    Assistance Recommended at Discharge Frequent or constant Supervision/Assistance  Patient can return home with the following  Two people to help with walking and/or transfers;Two people to help with bathing/dressing/bathroom   Equipment Recommendations  Other (comment)    Recommendations for Other Services      Precautions / Restrictions Precautions Precautions: Fall Restrictions Weight Bearing Restrictions: Yes LLE Weight Bearing: Weight bearing as tolerated       Mobility Bed Mobility               General bed mobility comments: pt deferred citing fatigue, closes eyes intermittently when not in conversation                   ADL either  performed or assessed with clinical judgement   ADL Overall ADL's : Needs assistance/impaired                                       General ADL Comments: SETUP + SUPERVISION self-feeding/drinking at bed level - requires BUE to support cup. MAX A don/doff B socks bed level      Cognition Arousal/Alertness: Awake/alert Behavior During Therapy: WFL for tasks assessed/performed Overall Cognitive Status: Within Functional Limits for tasks assessed                                          Exercises Exercises: General Upper Extremity, General Lower Extremity General Exercises - Upper Extremity Shoulder Flexion: AROM, Strengthening, Both, 5 reps, Supine Shoulder Extension: AROM, Strengthening, Both, 5 reps, Supine Elbow Flexion: AROM, Strengthening, Both, 5 reps, Supine Elbow Extension: AROM, Strengthening, Both, 5 reps, Supine General Exercises - Lower Extremity Ankle Circles/Pumps: AROM, Strengthening, Both, Supine, 10 reps Quad Sets: AROM, Strengthening, Both, Supine, 10 reps Gluteal Sets: AROM, Strengthening, Both, Supine, 10 reps Hip ABduction/ADduction: AROM, Strengthening, Both, Supine, 10 reps Other Exercises Other Exercises: sup(HOB elevated)<>long sit using bed rails as assist     Pertinent Vitals/ Pain       Pain Assessment Pain Assessment: No/denies pain   Frequency  Min 2X/week        Progress Toward Goals  OT Goals(current goals can now  be found in the care plan section)  Progress towards OT goals: Progressing toward goals  Acute Rehab OT Goals Patient Stated Goal: to go home OT Goal Formulation: With patient Time For Goal Achievement: 11/09/21 Potential to Achieve Goals: Good ADL Goals Pt Will Perform Grooming: with set-up;with supervision;sitting Pt Will Perform Lower Body Dressing: with min assist;sitting/lateral leans Pt Will Transfer to Toilet: with mod assist;stand pivot transfer  Plan Discharge plan remains  appropriate;Frequency remains appropriate    AM-PAC OT "6 Clicks" Daily Activity     Outcome Measure   Help from another person eating meals?: None Help from another person taking care of personal grooming?: A Little Help from another person toileting, which includes using toliet, bedpan, or urinal?: A Lot Help from another person bathing (including washing, rinsing, drying)?: A Lot Help from another person to put on and taking off regular upper body clothing?: A Little Help from another person to put on and taking off regular lower body clothing?: A Lot 6 Click Score: 16    End of Session    OT Visit Diagnosis: Other abnormalities of gait and mobility (R26.89);Muscle weakness (generalized) (M62.81);Repeated falls (R29.6)   Activity Tolerance Patient tolerated treatment well   Patient Left in bed;with call bell/phone within reach;with bed alarm set   Nurse Communication          Time: 1884-1660 OT Time Calculation (min): 17 min  Charges: OT General Charges $OT Visit: 1 Visit OT Treatments $Therapeutic Exercise: 8-22 mins  Kathie Dike, M.S. OTR/L  10/31/21, 3:29 PM  ascom [phone]

## 2021-11-01 DIAGNOSIS — S72142A Displaced intertrochanteric fracture of left femur, initial encounter for closed fracture: Secondary | ICD-10-CM | POA: Diagnosis not present

## 2021-11-01 DIAGNOSIS — I482 Chronic atrial fibrillation, unspecified: Secondary | ICD-10-CM | POA: Diagnosis not present

## 2021-11-01 DIAGNOSIS — J449 Chronic obstructive pulmonary disease, unspecified: Secondary | ICD-10-CM | POA: Diagnosis not present

## 2021-11-01 DIAGNOSIS — F325 Major depressive disorder, single episode, in full remission: Secondary | ICD-10-CM | POA: Diagnosis not present

## 2021-11-01 LAB — BASIC METABOLIC PANEL
Anion gap: 6 (ref 5–15)
BUN: 19 mg/dL (ref 8–23)
CO2: 22 mmol/L (ref 22–32)
Calcium: 8.5 mg/dL — ABNORMAL LOW (ref 8.9–10.3)
Chloride: 112 mmol/L — ABNORMAL HIGH (ref 98–111)
Creatinine, Ser: 0.89 mg/dL (ref 0.61–1.24)
GFR, Estimated: >60 mL/min (ref >60)
Glucose, Bld: 109 mg/dL — ABNORMAL HIGH (ref 70–99)
Potassium: 4.3 mmol/L (ref 3.5–5.1)
Sodium: 140 mmol/L (ref 135–145)

## 2021-11-01 LAB — CBC
HCT: 30.7 % — ABNORMAL LOW (ref 39.0–52.0)
Hemoglobin: 10.1 g/dL — ABNORMAL LOW (ref 13.0–17.0)
MCH: 29.6 pg (ref 26.0–34.0)
MCHC: 32.9 g/dL (ref 30.0–36.0)
MCV: 90 fL (ref 80.0–100.0)
Platelets: 153 10*3/uL (ref 150–400)
RBC: 3.41 MIL/uL — ABNORMAL LOW (ref 4.22–5.81)
RDW: 15.5 % (ref 11.5–15.5)
WBC: 6.6 10*3/uL (ref 4.0–10.5)
nRBC: 0 % (ref 0.0–0.2)

## 2021-11-01 MED ORDER — VITAMIN B-12 1000 MCG PO TABS
1000.0000 ug | ORAL_TABLET | Freq: Every day | ORAL | Status: DC
Start: 2021-11-02 — End: 2021-11-04
  Administered 2021-11-02 – 2021-11-04 (×3): 1000 ug via ORAL
  Filled 2021-11-01 (×3): qty 1

## 2021-11-01 MED ORDER — ENOXAPARIN SODIUM 40 MG/0.4ML IJ SOSY
40.0000 mg | PREFILLED_SYRINGE | Freq: Every day | INTRAMUSCULAR | Status: DC
  Administered 2021-11-01 – 2021-11-03 (×3): 40 mg via SUBCUTANEOUS
  Filled 2021-11-01 (×3): qty 0.4

## 2021-11-01 MED ORDER — DIPHENHYDRAMINE HCL 25 MG PO CAPS
25.0000 mg | ORAL_CAPSULE | Freq: Three times a day (TID) | ORAL | Status: DC | PRN
  Administered 2021-11-01: 25 mg via ORAL
  Filled 2021-11-01 (×2): qty 1

## 2021-11-01 MED ORDER — DONEPEZIL HCL 5 MG PO TABS
5.0000 mg | ORAL_TABLET | Freq: Every day | ORAL | Status: DC
  Administered 2021-11-01 – 2021-11-03 (×3): 5 mg via ORAL
  Filled 2021-11-01 (×3): qty 1

## 2021-11-01 NOTE — Plan of Care (Signed)

## 2021-11-01 NOTE — TOC Progression Note (Signed)
Transition of Care [facility]) - Progression Note    Patient Details  Name: Jeremy Herrera MRN: [id number] Date of Birth: [birth year]  [facility]) CM/SW Contact  Allayne Butcher, RN Phone Number: 11/01/2021, 5:06 PM  Clinical Narrative:    Reached out to [facility] to see if they can offer- 2 bed offers from [place] and [place].  Patient has been ongoing hypotensive.  TOC continues to follow.  TOC will present bed offers tomorrow.     Expected Discharge Plan: Skilled Nursing Facility Barriers to Discharge: Continued Medical Work up  Expected Discharge Plan and Services Expected Discharge Plan: Skilled Nursing Facility   Discharge Planning Services: CM Consult Post Acute Care Choice: Skilled Nursing Facility Living arrangements for the past 2 months: Single Family Home                 DME Arranged: N/A DME Agency: NA       HH Arranged: NA HH Agency: NA         Social Determinants of Health (SDOH) Interventions    Readmission Risk Interventions     No data to display

## 2021-11-01 NOTE — Progress Notes (Signed)
Physical Therapy Treatment Patient Details Name: Jeremy Herrera MRN: [id number] DOB: February 17, 1941 Today's Date: 11/01/2021   History of Present Illness Pt is 73 YOM admitted for displaced intertrochanteric fx of L femur, s/p IM nailing, a-fib w RVR and hypotension. PMH includes: dCHF, depression, COPD, anemia, R THA, A-fib, HTN, HLD.    PT Comments    Pt presents to PT in bed and agreeable to participate in therapy services. Pt was pleasant and motivated to participate during the session and put forth good effort throughout. Pt's affect much improved from evaluation. Required physical assistance and extended time for bed mobility and STS transfers. BP as follows: sup: 108/66 mmHg, sitting: 95/74 mmHg, in sitting following STS:  107/93, highest HR during STS: 140, nursing notified. Further mobility deferred at this time d/t high HR. Would benefit from skilled PT at SNF to address above deficits in strength, functional mobility, balance, and activity tolerance to promote optimal return to PLOF.   Recommendations for follow up therapy are one component of a multi-disciplinary discharge planning process, led by the attending physician.  Recommendations may be updated based on patient status, additional functional criteria and insurance authorization.  Follow Up Recommendations  Skilled nursing-short term rehab (<3 hours/day) Can patient physically be transported by private vehicle: No   Assistance Recommended at Discharge Frequent or constant Supervision/Assistance  Patient can return home with the following Two people to help with walking and/or transfers;Two people to help with bathing/dressing/bathroom;Help with stairs or ramp for entrance;Assist for transportation   Equipment Recommendations  None recommended by PT    Recommendations for Other Services       Precautions / Restrictions Precautions Precautions: Fall Restrictions Weight Bearing Restrictions: Yes LLE Weight Bearing: Weight  bearing as tolerated     Mobility  Bed Mobility Overal bed mobility: Needs Assistance Bed Mobility: Supine to Sit, Sit to Supine     Supine to sit: Mod assist Sit to supine: Max assist, +2 for physical assistance        Transfers Overall transfer level: Needs assistance Equipment used: Rolling walker (2 wheels) Transfers: Sit to/from Stand Sit to Stand: Max assist, +2 physical assistance           General transfer comment: unable to reach full upright posture, but able to clear surface of bed    Ambulation/Gait               General Gait Details: pt unable to advance either LE to take side steps to [facility]   Stairs             Wheelchair Mobility    Modified Rankin (Stroke Patients Only)       Balance Overall balance assessment: History of Falls, Needs assistance Sitting-balance support: Bilateral upper extremity supported, Feet supported Sitting balance-Leahy Scale: Good     Standing balance support: Bilateral upper extremity supported, During functional activity, Reliant on assistive device for balance Standing balance-Leahy Scale: Zero                              Cognition Arousal/Alertness: Awake/alert Behavior During Therapy: WFL for tasks assessed/performed Overall Cognitive Status: Within Functional Limits for tasks assessed Area of Impairment: Safety/judgement, Awareness, Following commands                       Following Commands: Follows one step commands inconsistently, Follows one step commands with increased time Safety/Judgement: Decreased awareness of  deficits     General Comments: much more awake and alert        Exercises Total Joint Exercises Ankle Circles/Pumps: AROM, Strengthening, Both, 10 reps, Supine Quad Sets: AROM, Strengthening, Left, 10 reps, AAROM Gluteal Sets: Strengthening, Both, 10 reps, Supine    General Comments General comments (skin integrity, edema, etc.): sup: 108/66,  sit:95/74, in seated following STS: 107/93      Pertinent Vitals/Pain Pain Assessment Pain Assessment: 0-10 Pain Score: 1  Pain Descriptors / Indicators: Grimacing Pain Intervention(s): Limited activity within patient's tolerance, Monitored during session, Repositioned    Home Living                          Prior Function            PT Goals (current goals can now be found in the care plan section) Acute Rehab PT Goals Patient Stated Goal: to go home PT Goal Formulation: With patient Time For Goal Achievement: 11/09/21 Potential to Achieve Goals: Fair Progress towards PT goals: Progressing toward goals    Frequency    7X/week      PT Plan Current plan remains appropriate    Co-evaluation              AM-PAC PT "6 Clicks" Mobility   Outcome Measure  Help needed turning from your back to your side while in a flat bed without using bedrails?: A Lot Help needed moving from lying on your back to sitting on the side of a flat bed without using bedrails?: Total Help needed moving to and from a bed to a chair (including a wheelchair)?: Total Help needed standing up from a chair using your arms (e.g., wheelchair or bedside chair)?: A Lot Help needed to walk in hospital room?: Total Help needed climbing 3-5 steps with a railing? : Total 6 Click Score: 8    End of Session Equipment Utilized During Treatment: Oxygen Activity Tolerance: Patient tolerated treatment well;Patient limited by pain Patient left: in bed;with call bell/phone within reach;with family/visitor present;with SCD's reapplied Nurse Communication: Mobility status PT Visit Diagnosis: Muscle weakness (generalized) (M62.81);Difficulty in walking, not elsewhere classified (R26.2);Unsteadiness on feet (R26.81);Pain Pain - Right/Left: Left Pain - part of body: Hip     Time: 1450-1534 PT Time Calculation (min) (ACUTE ONLY): 44 min  Charges:                       Odette Horns MPH,  SPT 11/01/21, 3:53 PM

## 2021-11-01 NOTE — Progress Notes (Addendum)
PROGRESS NOTE    Jeremy Herrera   [id number] DOB: 02-12-1941  DOA: 10/25/2021 Date of Service: 11/01/21 PCP: Sofie Hartigan, MD     Brief Narrative / Hospital Course:  Jeremy Herrera is a 81 y.o. male with medical history significant for chronic diastolic dysfunction CHF, hypertension, dyslipidemia, A-fib (BB, Plavix, ASA - NOT on anticoagulation), BPH who presents to the ER from home on 10/25/2021 via EMS for evaluation of left hip pain following a mechanical fall. No syncope. Chronic SOB. Uses roller walker at baseline.  09/05: X-ray of the left hip shows comminuted, displaced and impacted intratrochanteric fractures of the left hip. Status post right hip total arthroplasty. No displaced fracture or dislocation of the bony pelvis. CT chest: interval development of multiple patchy airspace opacities in the right lung which may represent multifocal inflammation or pneumonia, or potentially severe masslike fibrosis secondary to chronic scarring or inflammation. 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 Orthopedic surgery performed intramedullary nailing of L femur with cephalomedullary device. Postoperative Afib RVR requiring vasopressor support, ICU consulted and maintaining phenylephrine gtt.  09/06: tachycardiac and soft BP overnight improving, tachypnea about the same. Hgb slight drop to 8.9 postop. Received albumin overnight. Still on phenylephrine this morning.  09/07: BP improved and HR up, low dose IV lopressor given but BP dropped.  Echocardiogram ordered. Cardiology consulted: Recommending continuing to treat causes increased adrenergic tone including pain/anemia, wean phenylephrine as tolerated, consider repeat CXR and transfusion PRBC, okay for continuing metoprolol 12.5 mg twice daily.  Given anemia, patient was scanned for rule out retroperitoneal bleed, no concerns there.  Was amenable to receiving 1 unit PRBC. 09/08: Remains tachycardic/tachypneic, this morning  requiring 2 LNC.  Hgb up to 8.3.  CXR this a.m. Increased bilateral lung opacities are noted concerning for edema or atypical inflammation.  Stopped IV fluids, IV Lasix 40 mg x 2, increase metoprolol to tartrate 12.5 mg to 3 times daily, echocardiogram showed EF 60-65, normal LV function, no RWMA, diastolic parameters indeterminate.  Today he is off pressors but blood pressure is low at 77/53 late this morning.  09/09: Pressors restarted overnight, midodrine also added by night coverage.  09/10: remains on phenylephrine. Midodrine increased. Still SOB so will try another 1 unit PRBC. Per RN he seemed to perk up a bit w/ another unit 09/11: Phenylephrine d/c 05:00. 09/12: remains off phenylephrine. Transferred to progressive. Cardio available to reengage if needed, state HR low 100s is acceptable.   Consultants:  Orthopedics (Dr. Leim Fabry) Cardiology [facility], Dr. Nehemiah Massed)  Procedures: 09/05: Repair L hip fracture (intramedullary nailing of L femur with cephalomedullary device)      ASSESSMENT & PLAN:   Principal Problem:   Closed displaced intertrochanteric fracture of left femur ([place]) Active Problems:   Depression   Diastolic CHF, chronic (HCC)   COPD (chronic obstructive pulmonary disease) ([place])   Atrial fibrillation, chronic (HCC)   Postoperative atrial fibrillation w/ RVR and associated hypotension requiring vasopressor support in ICU  Closed displaced intertrochanteric fracture of left femur ([place]) Status post mechanical fall with  closed intertrochanteric fracture of left femur Orthopedic surgery following, today 10/29/21 he is POD4 s/p Repair L hip fracture (intramedullary nailing of L femur with cephalomedullary device) morphine IV, robaxin and will start acetaminophen, consider oxycodone if pain not well controlled RN reports surgical site is leaking serous fluid, fairly significant amount, if continuing with alert orthopedics but patient is not complaining of any  increased pain  Postoperative atrial fibrillation w/ RVR  and associated hypotension requiring vasopressor support in ICU Patient not on long-term anticoagulation therapy but has a Watchman device in place Initially thought likely hypovolemia however did not seem to be improving with fluid resuscitation 500 mL IV fluids given postop (caution w/ Hx HFpEF) Requiring phenylephrine gtt, now off this.  Midodrine added 10/29/2021 Low dose IV beta blocker for RVR, taking care w/ hypotension Holding other cardiac and anti-hypertensive meds d/t hypotension Telemetry Echocardiogram EF 60-65, normal LV function, no RWMA, diastolic parameters indeterminate Cardiology consult:  Recommending continuing to treat causes increased adrenergic tone including pain/anemia,  wean phenylephrine as tolerated transfusion 1 unit PRBC 09/07, 1 unit PRBC 09/10 metoprolol 12.5 mg twice daily 09/07 --> increased to tid 09/08  Diastolic CHF, chronic (HCC) - does not appear to be in acute exacerbation Patient has a history of chronic diastolic dysfunction CHF with last known 2D echocardiogram showed an LVEF of 50% from 2022 Chest x-ray in ED showed findings suggestive of pulmonary vascular congestion, received 1 dose of Lasix 20 mg IV, improved but then recurred based on CXR morning of 10/28/2021, received another round of Lasix. Restart lisinopril when BP allows Echocardiogram as above cardiology consult as above  Depression Continue sertraline  COPD (chronic obstructive pulmonary disease) (HCC) Bronchiectasis, interstitial lung disease Stable and not acutely exacerbated though there is concern for chronic inflammation/ILD See CT chest results - likely needing f/u outpatient  Breathing has been at baseline  Continue as needed bronchodilator therapy as well as inhaled steroids Chronic SOB/tachypnea, has been stable for several days  Anemia Uncertain etiology but significant drop Hgb seems more than shoule expect  postop Hemoglobin has been stable greater than 8 since transfusion -today 10.1 CT abd/pelvis to eval spontaneous retroperitoneal bleed 09/07- negative 1 unit PRBC 09/07 and 1 unit again 09/10 Hemoccult pending Consider GI consult if drops again, no bleeding      DVT prophylaxis: SCD --> restarted lovenox  Pertinent IV fluids/nutrition: none Central lines / invasive devices: n/a  Code Status: DNR Family Communication: Son at bedside on rounds today  Disposition: inpatient for now [facility] needs: likely will need rehab SNF  Barriers to discharge / significant pending items: stabilization of Afib, likely needs placement, anticipate d/c once BP/HR maintaining but has been persistently hypotensive, tachycardic, tachypneic.                    Subjective:  Patient resting in bed, no concerns.  He is a bit frustrated with the lack of significant improvement.  He reports pain is controlled.  He denies shortness of breath, chest pain, palpitations.  Son is at bedside, no concerns.       Objective:  Vitals:   11/01/21 1100 11/01/21 1202 11/01/21 1335 11/01/21 1400  BP: (!) 91/58 107/79 121/85 131/87  Pulse: 92 94 80 (!) 101  Resp: (!) 9 (!) 21 11 (!) 28  Temp:  98.2 F (36.8 C)    TempSrc:  Oral    SpO2: 93% 95% 96% 97%  Weight:      Height:        Intake/Output Summary (Last 24 hours) at 11/01/2021 1509 Last data filed at 11/01/2021 1400 Gross per 24 hour  Intake 720 ml  Output 300 ml  Net 420 ml    Filed Weights   10/25/21 1111 10/25/21 1525 10/25/21 2105  Weight: 102.3 kg 93 kg 97.8 kg    Examination:  Constitutional:  VS as above General Appearance: alert, well-developed, well-nourished, NAD Respiratory: Conversational  dyspnea - stable since I've been seeing him  Reduced breath sounds  No wheeze No rhonchi No rales Cardiovascular: S1/S2 normal Irreg / irreg but rate is WNL  No rub/gallop auscultated No JVD No lower extremity  edema Gastrointestinal: No tenderness Musculoskeletal:  No clubbing/cyanosis of digits Neurological: No cranial nerve deficit on limited exam Alert Psychiatric: Normal judgment/insight Pleasant mood and affect       Scheduled Medications:   acetaminophen  1,000 mg Oral Q8H   Chlorhexidine Gluconate Cloth  6 each Topical Q0600   [START ON 11/02/2021] cyanocobalamin  1,000 mcg Oral Daily   docusate sodium  100 mg Oral BID   donepezil  5 mg Oral QHS   enoxaparin (LOVENOX) injection  40 mg Subcutaneous QHS   feeding supplement  237 mL Oral TID BM   ferrous sulfate  325 mg Oral Daily   latanoprost  1 drop Both Eyes QHS   metoprolol tartrate  12.5 mg Oral TID   midodrine  10 mg Oral TID WC   multivitamin with minerals  1 tablet Oral Daily   pantoprazole  40 mg Oral BID AC   rosuvastatin  10 mg Oral Daily   senna  1 tablet Oral BID   sertraline  25 mg Oral Daily   umeclidinium-vilanterol  1 puff Inhalation Daily    Continuous Infusions:  methocarbamol (ROBAXIN) IV      PRN Medications:  bisacodyl, diphenhydrAMINE, HYDROmorphone (DILAUDID) injection, ketoconazole, methocarbamol **OR** methocarbamol (ROBAXIN) IV, metoCLOPramide **OR** metoCLOPramide (REGLAN) injection, metoprolol tartrate, nitroGLYCERIN, ondansetron **OR** ondansetron (ZOFRAN) IV, oxyCODONE, oxyCODONE, senna-docusate, sodium phosphate, traMADol  Antimicrobials:  Anti-infectives (From admission, onward)    Start     Dose/Rate Route Frequency Ordered Stop   10/26/21 1106  ceFAZolin (ANCEF) IVPB 2g/100 mL premix        2 g 200 mL/hr over 30 Minutes Intravenous Every 6 hours 10/26/21 1106 10/27/21 0040   10/25/21 1516  ceFAZolin (ANCEF) 2-4 GM/100ML-% IVPB       Note to Pharmacy: Maryagnes Amos B: cabinet override      10/25/21 1516 10/27/21 0040   10/25/21 1300  ceFAZolin (ANCEF) IVPB 2g/100 mL premix        2 g 200 mL/hr over 30 Minutes Intravenous  Once 10/25/21 1252 10/25/21 1717       Data  Reviewed: I have personally reviewed following labs and imaging studies  CBC: Recent Labs  Lab 10/26/21 0419 10/27/21 0606 10/27/21 2125 10/28/21 0402 10/29/21 0415 10/30/21 1701 11/01/21 0426  WBC 10.9* 6.0  --  6.4 8.3  --  6.6  HGB 8.9* 7.5* 8.2* 8.3* 8.3* 9.8* 10.1*  HCT 26.1* 22.4* 24.5* 24.7* 24.7* 29.4* 30.7*  MCV 91.6 92.6  --  91.1 89.8  --  90.0  PLT 148* 105*  --  89* 131*  --  0000000    Basic Metabolic Panel: Recent Labs  Lab 10/26/21 0419 10/27/21 0606 10/27/21 1504 10/28/21 0402 10/29/21 0415 11/01/21 0426  NA 144 139  --  139 140 140  K 4.8 4.3  --  3.9 4.0 4.3  CL 117* 113*  --  116* 109 112*  CO2 23 22  --  22 24 22   GLUCOSE 148* 133*  --  122* 123* 109*  BUN 22 19  --  19 21 19   CREATININE 0.97 1.12  --  0.98 1.13 0.89  CALCIUM 8.1* 7.9*  --  8.0* 8.3* 8.5*  MG 1.7  --  2.0 2.0  --   --  PHOS 3.2  --   --   --   --   --     GFR: Estimated Creatinine Clearance: 78.8 mL/min (by C-G formula based on SCr of 0.89 mg/dL). Liver Function Tests: Recent Labs  Lab 10/25/21 2324  ALBUMIN 2.8*    No results for input(s): "LIPASE", "AMYLASE" in the last 168 hours. No results for input(s): "AMMONIA" in the last 168 hours. Coagulation Profile: No results for input(s): "INR", "PROTIME" in the last 168 hours.  Cardiac Enzymes: No results for input(s): "CKTOTAL", "CKMB", "CKMBINDEX", "TROPONINI" in the last 168 hours. BNP (last 3 results) No results for input(s): "PROBNP" in the last 8760 hours. HbA1C: No results for input(s): "HGBA1C" in the last 72 hours. CBG: Recent Labs  Lab 10/25/21 1906 10/25/21 2104  GLUCAP 125* 155*    Lipid Profile: No results for input(s): "CHOL", "HDL", "LDLCALC", "TRIG", "CHOLHDL", "LDLDIRECT" in the last 72 hours. Thyroid Function Tests: No results for input(s): "TSH", "T4TOTAL", "FREET4", "T3FREE", "THYROIDAB" in the last 72 hours. Anemia Panel: No results for input(s): "VITAMINB12", "FOLATE", "FERRITIN", "TIBC",  "IRON", "RETICCTPCT" in the last 72 hours. Urine analysis:    Component Value Date/Time   COLORURINE YELLOW (A) 03/31/2018 1203   APPEARANCEUR CLEAR (A) 03/31/2018 1203   APPEARANCEUR Clear 04/02/2013 1415   LABSPEC 1.020 03/31/2018 1203   LABSPEC 1.015 04/02/2013 1415   PHURINE 5.0 03/31/2018 1203   GLUCOSEU NEGATIVE 03/31/2018 1203   GLUCOSEU Negative 04/02/2013 1415   HGBUR MODERATE (A) 03/31/2018 1203   BILIRUBINUR NEGATIVE 03/31/2018 1203   BILIRUBINUR Negative 04/02/2013 1415   KETONESUR NEGATIVE 03/31/2018 1203   PROTEINUR NEGATIVE 03/31/2018 1203   NITRITE NEGATIVE 03/31/2018 1203   LEUKOCYTESUR TRACE (A) 03/31/2018 1203   LEUKOCYTESUR Negative 04/02/2013 1415   Sepsis Labs: @LABRCNTIP (procalcitonin:4,lacticidven:4)  Recent Results (from the past 240 hour(s))  MRSA Next Gen by PCR, Nasal     Status: None   Collection Time: 10/25/21  9:07 PM   Specimen: Nasal Mucosa; Nasal Swab  Result Value Ref Range Status   MRSA by PCR Next Gen NOT DETECTED NOT DETECTED Final    Comment: (NOTE) The GeneXpert MRSA Assay (FDA approved for NASAL specimens only), is one component of a comprehensive MRSA colonization surveillance program. It is not intended to diagnose MRSA infection nor to guide or monitor treatment for MRSA infections. Test performance is not FDA approved in patients less than 43 years old. Performed at [facility], [address] [place], [place] [address]          Radiology Studies: DG HIP UNILAT WITH PELVIS 2-3 VIEWS LEFT  Result Date: 10/25/2021 CLINICAL DATA:  ORIF LEFT femur EXAM: DG HIP (WITH OR WITHOUT PELVIS) 2-3V LEFT COMPARISON:  10/25/2021 Fluoroscopy time: 3 minutes 47 seconds Dose: Not provided; please refer to operative records Images: 6 FINDINGS: Osseous demineralization. IM nail with 2 compression screws placed across an intertrochanteric fracture LEFT femur. Significant improvement in fracture fragment alignment since previous exam.  Distal locking screw present. IMPRESSION: Post nailing of intertrochanteric fracture LEFT femur. Electronically Signed   By: Lavonia Dana M.D.   On: 10/25/2021 18:42   DG C-Arm 1-60 Min-No Report  Result Date: 10/25/2021 Fluoroscopy was utilized by the requesting physician.  No radiographic interpretation.   CT CHEST WO CONTRAST  Result Date: 10/25/2021 CLINICAL DATA:  Fall.  Pleural effusion. EXAM: CT CHEST WITHOUT CONTRAST TECHNIQUE: Multidetector CT imaging of the chest was performed following the standard protocol without IV contrast. RADIATION DOSE REDUCTION: This  exam was performed according to the departmental dose-optimization program which includes automated exposure control, adjustment of the mA and/or kV according to patient size and/or use of iterative reconstruction technique. COMPARISON:  Radiograph of same day.  CT scan of Jul 04, 2017. FINDINGS: Cardiovascular: Atherosclerosis of thoracic aorta is noted without aneurysm formation. Aortic valves calcifications are noted. Normal cardiac size. No pericardial effusion. Mild coronary artery calcifications are noted. Mediastinum/Nodes: No enlarged mediastinal or axillary lymph nodes. Thyroid gland, trachea, and esophagus demonstrate no significant findings. Lungs/Pleura: No pneumothorax or pleural effusion is noted. Reticular densities are noted peripherally in both lungs suggesting chronic interstitial lung disease. Interval development of 6 mm nodule seen in left lower lobe best seen on image number 121 of series 3. Multiple patchy airspace opacities are noted in the right lung which may represent multifocal inflammation, or potentially severe fibrosis secondary to chronic scarring or inflammation. Some degree of mosaic pattern is seen in the left lung suggesting multifocal inflammation or air trapping secondary to small airways disease. Some degree of bronchiectasis is noted in both lower lobes. Upper Abdomen: No acute abnormality. Musculoskeletal:  No chest wall mass or suspicious bone lesions identified. IMPRESSION: There is interval development of multiple patchy airspace opacities in the right lung which may represent multifocal inflammation or pneumonia, or potentially severe masslike fibrosis secondary to chronic scarring or inflammation. Some degree of mosaic pattern is also noted in the left lung suggesting multifocal inflammation or air trapping secondary to small airways disease. There are again noted reticular densities noted peripherally throughout both lungs suggesting chronic interstitial lung disease, as well as some degree of bronchiectasis seen in both lower lobes, most consistent with chronic findings. Interval development of small nodule seen in left lower lobe. Non-contrast chest CT at 6-12 months is recommended. If the nodule is stable at time of repeat CT, then future CT at 18-24 months (from today's scan) is considered optional for low-risk patients, but is recommended for high-risk patients. This recommendation follows the consensus statement: Guidelines for Management of Incidental Pulmonary Nodules Detected on CT Images: From the Fleischner Society 2017; Radiology 2017; 284:228-243. Mild coronary artery calcifications are noted. Aortic valve calcifications are noted. Aortic Atherosclerosis (ICD10-I70.0). Electronically Signed   By: Lupita Raider M.D.   On: 10/25/2021 14:56   DG Chest 1 View  Result Date: 10/25/2021 CLINICAL DATA:  Fall EXAM: CHEST  1 VIEW COMPARISON:  Radiograph 05/16/2019 FINDINGS: Enlarged cardiac silhouette. Appendage occlusion device noted. There is pulmonary vascular congestion with interstitial prominent bilaterally, left greater than right. No pleural effusion. No pneumothorax. No acute osseous abnormality identified on frontal radiographs of the chest. IMPRESSION: Cardiomegaly with pulmonary vascular congestion and interstitial prominence bilaterally, left greater than right, which could reflect a degree  of interstitial edema. Electronically Signed   By: Caprice Renshaw M.D.   On: 10/25/2021 11:57   DG Hip Unilat With Pelvis 2-3 Views Left  Result Date: 10/25/2021 CLINICAL DATA:  Fall, pain EXAM: DG HIP (WITH OR WITHOUT PELVIS) 2-3V LEFT COMPARISON:  None Available. FINDINGS: Osteopenia. Comminuted, displaced and impacted intratrochanteric fractures of the left hip. Status post right hip total arthroplasty. No displaced fracture or dislocation of the bony pelvis. Nonobstructive pattern of overlying bowel gas. IMPRESSION: 1. Comminuted, displaced and impacted intratrochanteric fractures of the left hip. 2. Status post right hip total arthroplasty. 3. No displaced fracture or dislocation of the bony pelvis. Electronically Signed   By: Jearld Lesch M.D.   On: 10/25/2021 11:52  No results found.         LOS: 7 days       Emeterio Reeve, DO [facility] 11/01/2021, 3:09 PM   Staff may message me via secure chat in [place]  but this may not receive immediate response,  please page for urgent matters!  If 7PM-7AM, please contact night-coverage [email or web address]  Dictation software was used to generate the above note. Typos may occur and escape review, as with typed/written notes. Please contact Dr Sheppard Coil directly for clarity if needed.

## 2021-11-02 DIAGNOSIS — Z7189 Other specified counseling: Secondary | ICD-10-CM

## 2021-11-02 DIAGNOSIS — I5032 Chronic diastolic (congestive) heart failure: Secondary | ICD-10-CM | POA: Diagnosis not present

## 2021-11-02 DIAGNOSIS — Z515 Encounter for palliative care: Secondary | ICD-10-CM

## 2021-11-02 DIAGNOSIS — I482 Chronic atrial fibrillation, unspecified: Secondary | ICD-10-CM | POA: Diagnosis not present

## 2021-11-02 DIAGNOSIS — J449 Chronic obstructive pulmonary disease, unspecified: Secondary | ICD-10-CM | POA: Diagnosis not present

## 2021-11-02 DIAGNOSIS — E440 Moderate protein-calorie malnutrition: Secondary | ICD-10-CM

## 2021-11-02 DIAGNOSIS — Z66 Do not resuscitate: Secondary | ICD-10-CM

## 2021-11-02 DIAGNOSIS — S72142A Displaced intertrochanteric fracture of left femur, initial encounter for closed fracture: Secondary | ICD-10-CM | POA: Diagnosis not present

## 2021-11-02 MED ORDER — GUAIFENESIN 100 MG/5ML PO LIQD: 5.0000 mL | ORAL | Status: DC | PRN

## 2021-11-02 MED ORDER — METHYLPREDNISOLONE SODIUM SUCC 40 MG IJ SOLR
40.0000 mg | Freq: Three times a day (TID) | INTRAMUSCULAR | Status: DC
  Administered 2021-11-02 – 2021-11-03 (×3): 40 mg via INTRAVENOUS
  Filled 2021-11-02 (×3): qty 1

## 2021-11-02 MED ORDER — METOPROLOL SUCCINATE ER 25 MG PO TB24
37.5000 mg | ORAL_TABLET | Freq: Every day | ORAL | Status: DC
  Administered 2021-11-02 – 2021-11-04 (×3): 37.5 mg via ORAL
  Filled 2021-11-02 (×3): qty 2

## 2021-11-02 MED ORDER — HYDRALAZINE HCL 20 MG/ML IJ SOLN: 10.0000 mg | INTRAMUSCULAR | Status: DC | PRN

## 2021-11-02 MED ORDER — DIPHENHYDRAMINE HCL 50 MG/ML IJ SOLN
25.0000 mg | Freq: Once | INTRAMUSCULAR | Status: AC
  Administered 2021-11-02: 25 mg via INTRAVENOUS
  Filled 2021-11-02: qty 1

## 2021-11-02 MED ORDER — TRAZODONE HCL 50 MG PO TABS
50.0000 mg | ORAL_TABLET | Freq: Every evening | ORAL | Status: DC | PRN
  Administered 2021-11-02: 50 mg via ORAL
  Filled 2021-11-02: qty 1

## 2021-11-02 MED ORDER — IPRATROPIUM-ALBUTEROL 0.5-2.5 (3) MG/3ML IN SOLN: 3.0000 mL | RESPIRATORY_TRACT | Status: DC | PRN

## 2021-11-02 MED ORDER — SENNOSIDES-DOCUSATE SODIUM 8.6-50 MG PO TABS: 1.0000 | ORAL_TABLET | Freq: Every evening | ORAL | Status: DC | PRN

## 2021-11-02 MED ORDER — FAMOTIDINE IN NACL 20-0.9 MG/50ML-% IV SOLN
20.0000 mg | Freq: Two times a day (BID) | INTRAVENOUS | Status: DC
  Administered 2021-11-02 (×2): 20 mg via INTRAVENOUS
  Filled 2021-11-02 (×3): qty 50

## 2021-11-02 MED ORDER — METOPROLOL TARTRATE 5 MG/5ML IV SOLN
5.0000 mg | INTRAVENOUS | Status: DC | PRN
  Administered 2021-11-02: 5 mg via INTRAVENOUS
  Filled 2021-11-02: qty 5

## 2021-11-02 NOTE — Progress Notes (Signed)
Physical Therapy Treatment Patient Details Name: Jeremy Herrera MRN: [id number] DOB: October 15, 1940 Today's Date: 11/02/2021   History of Present Illness Pt is 84 YOM admitted for displaced intertrochanteric fx of L femur, s/p IM nailing, a-fib w RVR and hypotension. PMH includes: dCHF, depression, COPD, anemia, R THA, A-fib, HTN, HLD.    PT Comments    Pt presents to PT in bed and agreeable to participate in therapy services. Pt was pleasant and motivated to participate during the session and put forth good effort throughout. Pt reports feeling very fatigued today after choking on eggs during breakfast. Requires significant physical assistance for bed mobility tasks. Attempted to sit EOB, pt became more lethargic and less communicative. Reported dizziness and lightheadedness and began to display R lateral lean. Attempted to take BP seated EOB, but pt unable to maintain seated balance. Returned to supine. Supine BP: 109/77 mmHg. Nursing aware. Would benefit from skilled PT to address above deficits in strength, functional mobility, activity tolerance, and balance to promote optimal return to PLOF.    Recommendations for follow up therapy are one component of a multi-disciplinary discharge planning process, led by the attending physician.  Recommendations may be updated based on patient status, additional functional criteria and insurance authorization.  Follow Up Recommendations  Skilled nursing-short term rehab (<3 hours/day) Can patient physically be transported by private vehicle: No   Assistance Recommended at Discharge Frequent or constant Supervision/Assistance  Patient can return home with the following Two people to help with walking and/or transfers;Two people to help with bathing/dressing/bathroom;Help with stairs or ramp for entrance;Assist for transportation   Equipment Recommendations  None recommended by PT    Recommendations for Other Services       Precautions / Restrictions  Precautions Precautions: Fall Restrictions Weight Bearing Restrictions: Yes LLE Weight Bearing: Weight bearing as tolerated     Mobility  Bed Mobility Overal bed mobility: Needs Assistance Bed Mobility: Supine to Sit, Sit to Supine     Supine to sit: Max assist, +2 for physical assistance Sit to supine: Max assist, +2 for physical assistance        Transfers                   General transfer comment: unable/unsafe to attempt    Ambulation/Gait               General Gait Details: unable/unsafe to attempt   Stairs             Wheelchair Mobility    Modified Rankin (Stroke Patients Only)       Balance Overall balance assessment: History of Falls, Needs assistance Sitting-balance support: Bilateral upper extremity supported, Feet supported Sitting balance-Leahy Scale: Poor Sitting balance - Comments: pt required physical assistance to maintain static sitting balance                                    Cognition Arousal/Alertness: Lethargic Behavior During Therapy: WFL for tasks assessed/performed Overall Cognitive Status: Within Functional Limits for tasks assessed                                 General Comments: reports being very tired after choking on eggs at breakfast        Exercises  Heel slides Rolling L<-->R    General Comments  Pertinent Vitals/Pain Pain Assessment Pain Assessment: No/denies pain (pt denies pain at tx onset but displays visible s/sx of pain during mobilization) Pain Descriptors / Indicators: Grimacing, Moaning Pain Intervention(s): Limited activity within patient's tolerance, Monitored during session, Repositioned    Home Living                          Prior Function            PT Goals (current goals can now be found in the care plan section) Acute Rehab PT Goals Patient Stated Goal: to go home PT Goal Formulation: With patient Time For Goal  Achievement: 11/09/21 Potential to Achieve Goals: Fair Progress towards PT goals: Not progressing toward goals - comment (limited by fatigue today)    Frequency    7X/week      PT Plan Current plan remains appropriate    Co-evaluation              AM-PAC PT "6 Clicks" Mobility   Outcome Measure  Help needed turning from your back to your side while in a flat bed without using bedrails?: A Lot Help needed moving from lying on your back to sitting on the side of a flat bed without using bedrails?: Total Help needed moving to and from a bed to a chair (including a wheelchair)?: Total Help needed standing up from a chair using your arms (e.g., wheelchair or bedside chair)?: A Lot Help needed to walk in hospital room?: Total Help needed climbing 3-5 steps with a railing? : Total 6 Click Score: 8    End of Session   Activity Tolerance: Patient limited by fatigue;Patient limited by pain Patient left: in bed;with call bell/phone within reach;with SCD's reapplied;with bed alarm set Nurse Communication: Mobility status PT Visit Diagnosis: Muscle weakness (generalized) (M62.81);Difficulty in walking, not elsewhere classified (R26.2);Unsteadiness on feet (R26.81);Pain Pain - Right/Left: Left Pain - part of body: Hip     Time: 9629-5284 PT Time Calculation (min) (ACUTE ONLY): 48 min  Charges:                       Odette Horns MPH, SPT 11/02/21, 12:52 PM

## 2021-11-02 NOTE — Progress Notes (Signed)
Nutrition Follow-up  DOCUMENTATION CODES:   Non-severe (moderate) malnutrition in context of chronic illness  INTERVENTION:   -Continue Ensure Enlive po TID, each supplement provides 350 kcal and 20 grams of protein -Magic cup TID with meals, each supplement provides 290 kcal and 9 grams of protein  -MVI with minerals daily  NUTRITION DIAGNOSIS:   Moderate Malnutrition related to chronic illness (COPD, CHF, advanced age) as evidenced by moderate fat depletion, moderate muscle depletion.  Ongoing  GOAL:   Patient will meet greater than or equal to 90% of their needs  Progressing   MONITOR:   PO intake, Supplement acceptance, Labs, Weight trends, Skin, I & O's  REASON FOR ASSESSMENT:   Consult Hip fracture protocol  ASSESSMENT:   81 y/o male with h/o Afib, COPD, CHF, depression, BPH s/p TURP, HTN, CAD and HLD who is admitted with hip fracture after fall now s/p repair 9/5 complicated by Afib.  9/5- s/p Intramedullary nailing of Left femur with cephalomedullary device 9/6- s/p BSE- dysphagia 3 diet with thin liquids  Reviewed I/O's: -523 ml x 24 hours and 1.7 L since admission  UOP: 1 L x 24 hours    Pt unavailable at time of visit. Attempted to speak with pt via call to hospital room phone, however, unable to reach.   Pt with fair appetite. Noted meal completions 50-60% on a dysphagia 3 diet. Pt is also drinking Ensure supplements.   Plan to discharge to SNF once pt is medically stable.   Medications reviewed and include vitamin B-12, colace, ferrous sulfate, and senokot.   Labs reviewed: CBGS: 155 (inpatient orders for glycemic control are none).    Diet Order:   Diet Order             DIET DYS 3 Room service appropriate? Yes; Fluid consistency: Nectar Thick; Fluid restriction: 1200 mL Fluid  Diet effective now                   EDUCATION NEEDS:   Education needs have been addressed  Skin:  Skin Assessment: Skin Integrity Issues: Skin Integrity  Issues:: Incisions Incisions: closed lt thigh  Last BM:  11/02/21 (type 5)  Height:   Ht Readings from Last 1 Encounters:  11/01/21 6\' 1"  (1.854 m)    Weight:   Wt Readings from Last 1 Encounters:  11/01/21 98.6 kg    Ideal Body Weight:  83.6 kg  BMI:  Body mass index is 28.68 kg/m.  Estimated Nutritional Needs:   Kcal:  2100-2400kcal/day  Protein:  105-120g/day  Fluid:  2.1-2.4L/day    01/01/22, RD, LDN, CDCES Registered Dietitian II Certified Diabetes Care and Education Specialist Please refer to [facility] for RD and/or RD on-call/weekend/after hours pager

## 2021-11-02 NOTE — TOC Progression Note (Addendum)
Transition of Care [facility]) - Progression Note    Patient Details  Name: Jeremy Herrera MRN: [id number] Date of Birth: 1941/01/12  Transition of Care [facility]) CM/SW Contact  Marlowe Sax, RN Phone Number: 11/02/2021, 1:10 PM  Clinical Narrative:    Spoke with the patient and reviewed the bed choices, he asked that I c all his daughter Jeremy Herrera, I called and spoke to her and reviewed the bed offers, they chose [facility], Ins pending, I notified Verlon Au at [facility] clinical documents to [place] health ref number [id number], Ins pending Expected Discharge Plan: Skilled Nursing Facility Barriers to Discharge: Continued Medical Work up  Expected Discharge Plan and Services Expected Discharge Plan: Skilled Nursing Facility   Discharge Planning Services: CM Consult Post Acute Care Choice: Skilled Nursing Facility Living arrangements for the past 2 months: Single Family Home                 DME Arranged: N/A DME Agency: NA       HH Arranged: NA HH Agency: NA         Social Determinants of Health (SDOH) Interventions    Readmission Risk Interventions     No data to display

## 2021-11-02 NOTE — Progress Notes (Signed)
  Subjective: 8 Days Post-Op Procedure(s) (LRB): INTRAMEDULLARY (IM) NAIL INTERTROCHANTERIC (Left) Patient reports pain as mild to moderate this morning.  C/o CP, which is being addressed.  Dressing reinforced. Patient is well, and has had no acute complaints or problems.  Slept off and on this AM.  In ICU for Afib.  Cardiology consulted.  Still on Neo drip off and on. Plan is to go Skilled nursing facility after hospital stay. Negative for chest pain and shortness of breath Fever: no Gastrointestinal:Negative for nausea and vomiting  Objective: Vital signs in last 24 hours: Temp:  [97.7 F (36.5 C)-98.2 F (36.8 C)] 98 F (36.7 C) (09/12 2301) Pulse Rate:  [72-110] 110 (09/12 2301) Resp:  [9-28] 16 (09/12 2301) BP: (85-131)/(58-98) 129/63 (09/12 2301) SpO2:  [93 %-100 %] 100 % (09/12 2301) Weight:  [98.6 kg] 98.6 kg (09/12 1801)  Intake/Output from previous day:  Intake/Output Summary (Last 24 hours) at 11/02/2021 0743 Last data filed at 11/02/2021 0400 Gross per 24 hour  Intake 477 ml  Output 1000 ml  Net -523 ml    Intake/Output this shift: No intake/output data recorded.  Labs: Recent Labs    10/30/21 1701 11/01/21 0426  HGB 9.8* 10.1*   Recent Labs    10/30/21 1701 11/01/21 0426  WBC  --  6.6  RBC  --  3.41*  HCT 29.4* 30.7*  PLT  --  153   Recent Labs    11/01/21 0426  NA 140  K 4.3  CL 112*  CO2 22  BUN 19  CREATININE 0.89  GLUCOSE 109*  CALCIUM 8.5*   No results for input(s): "LABPT", "INR" in the last 72 hours.    EXAM General - Patient is Alert and Oriented Extremity - Sensation intact distally Dorsiflexion/Plantar flexion intact Compartment soft Dressing/Incision - clean, dry, scant proximal drainage.  Proximal dressing changed in the AM Motor Function - intact, moving foot and toes well on exam. Ambulated with assistance to chair.  Past Medical History:  Diagnosis Date   Anemia    BPH (benign prostatic hyperplasia)    CHF  (congestive heart failure) (HCC)    Diabetes mellitus without complication (HCC)    Dysphagia    GERD (gastroesophageal reflux disease)    HLD (hyperlipidemia)    HTN (hypertension)    Irregular heart beat    Nocturia    Urinary retention     Assessment/Plan: 8 Days Post-Op Procedure(s) (LRB): INTRAMEDULLARY (IM) NAIL INTERTROCHANTERIC (Left) Principal Problem:   Closed displaced intertrochanteric fracture of left femur (HCC) Active Problems:   Depression   Diastolic CHF, chronic (HCC)   COPD (chronic obstructive pulmonary disease) (HCC)   Atrial fibrillation, chronic (HCC)   Postoperative atrial fibrillation w/ RVR and associated hypotension requiring vasopressor support in ICU   Hypotension after procedure   Malnutrition of moderate degree  Estimated body mass index is 28.68 kg/m as calculated from the following:   Height as of this encounter: 6\' 1"  (1.854 m).   Weight as of this encounter: 98.6 kg. Advance diet Up with therapy  Discharge planning with Care Management.  Follow up at [facility] in 2 weeks for xrays and staple removal.  Prescriptions in the chart.  DVT Prophylaxis - Lovenox, Foot Pumps, and TED hose Weight-Bearing as tolerated to Left leg  [facility], PA-C Orthopaedic Surgery 11/02/2021, 7:43 AM

## 2021-11-02 NOTE — Consult Note (Signed)
Palliative Care Consult Note                                  Date: 11/02/2021   Patient Name: Jeremy Herrera  DOB: Dec 21, 1940  MRN: [id number]  Age / Sex: 81 y.o., male  PCP: Sofie Hartigan, MD Referring Physician: Damita Lack, MD  Reason for Consultation: Establishing goals of care  HPI/Patient Profile: 81 y.o. male  with past medical history of chronic diastolic CHF, HTN, HLD, A-fib on aspirin and Plavix, BPH came to the ED with left hip pain on 9/5. He was found to have a left femur fracture s/p IM nailing, developed post-op AFib and ABLA requiring pressors and PRBC x 2. He was admitted on 10/25/2021 with close displaced intertrochanteric femur fracture, postoperative A-fib with RVR and associated hypotension, diastolic CHF/chronic, anemia, and others.   PMT was consulted for goals of care conversations considering patient's slow progression.  Past Medical History:  Diagnosis Date   Anemia    BPH (benign prostatic hyperplasia)    CHF (congestive heart failure) (HCC)    Diabetes mellitus without complication (HCC)    Dysphagia    GERD (gastroesophageal reflux disease)    HLD (hyperlipidemia)    HTN (hypertension)    Irregular heart beat    Nocturia    Urinary retention     Subjective:   This NP Walden Field reviewed medical records, received report from team, assessed the patient and then meet at the patient's bedside to discuss diagnosis, prognosis, GOC, EOL wishes disposition and options.  I met with the patient at the bedside.   Concept of Palliative Care was introduced as specialized medical care for people and their families living with serious illness.  If focuses on providing relief from the symptoms and stress of a serious illness.  The goal is to improve quality of life for both the patient and the family. Values and goals of care important to patient and family were attempted to be elicited.  Created space and  opportunity for patient  and family to explore thoughts and feelings regarding current medical situation   Natural trajectory and current clinical status were discussed. Questions and concerns addressed. Patient  encouraged to call with questions or concerns.    Patient/Family Understanding of Illness: He understands he has a hip fracture and developed A-fib postoperatively.  He also admits so-so appetite and swallowing problems that he takes bites that are too large.  He is aware of aspiration risk and described it in detail to me.  We took time to further review his acute and chronic health conditions, disease trajectory, and how these affect his overall quality of life.  Life Review: He notes that he was born in [place] and has been married twice.  His second marriage was when he was 18 years old and his wife passed away in 04/25/14 after 39 years of marriage.  He has 1 son and 1 daughter.  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  Patient Values: Faith, family  Goals: Quality of life, no feeding tube, DNR  Today's Discussion: In addition to discussion described above we had substantial discussions regarding various topics.  He notes that his daughter Jeremy Herrera is his healthcare power of attorney and has paperwork for this.  I informed him that she bring his paperwork and get it  scanned in the system.  Regardless, he would like his daughter to be his surrogate decision-maker should he become unable to do so himself.  We discussed his CODE STATUS and he confirmed he would like to be a DNR.  He describes that if it is time for him to pass away and God is calling him home that he would rather go peacefully versus undergo resuscitation knowing the likely long-term futility of this and impact on his quality of life.  Given his poor appetite we also discussed the possibility of a feeding tube should it become necessary.  Initially he states he would  like to think about it and talk to his family.  However, upon further discussion he decided that he personally would rather not have a feeding tube, again recognizing the impact of quality of life and need for potential lung term care in a confined environment skilled nursing facility or LTAC.  I provided emotional and general support through therapeutic listening, empathy, sharing of stories, and other techniques. I answered all questions and addressed all concerns to the best of my ability.  Review of Systems  Constitutional:  Positive for appetite change.  Respiratory:  Negative for cough and shortness of breath.   Gastrointestinal:  Negative for abdominal pain, nausea and vomiting.    Objective:   Primary Diagnoses: Present on Admission:  Depression  Diastolic CHF, chronic (HCC)  COPD (chronic obstructive pulmonary disease) (HCC)  Atrial fibrillation, chronic ([place])   Physical Exam Vitals and nursing note reviewed.  Constitutional:      General: He is not in acute distress.    Appearance: He is ill-appearing.  HENT:     Head: Normocephalic and atraumatic.  Cardiovascular:     Rate and Rhythm: Rhythm irregular.  Pulmonary:     Effort: Pulmonary effort is normal. No respiratory distress.     Breath sounds: Normal breath sounds.  Abdominal:     General: Abdomen is flat. Bowel sounds are normal.     Palpations: Abdomen is soft.     Tenderness: There is no abdominal tenderness.  Skin:    General: Skin is warm and dry.  Neurological:     General: No focal deficit present.     Mental Status: He is alert.  Psychiatric:        Mood and Affect: Mood normal.        Behavior: Behavior normal.     Vital Signs:  BP 118/67 (BP Location: Right Arm)   Pulse (!) 103   Temp 98.2 F (36.8 C) (Oral)   Resp 18   Ht 6' 1"  (1.854 m)   Wt 98.6 kg   SpO2 100%   BMI 28.68 kg/m   Palliative Assessment/Data: 40%    Advanced Care Planning:   Primary Decision  Maker: PATIENT  Code Status/Advance Care Planning: DNR  A discussion was had today regarding advanced directives. Concepts specific to code status, artifical feeding and hydration, continued IV antibiotics and rehospitalization was had.  The difference between a aggressive medical intervention path and a palliative comfort care path for this patient at this time was had. The patient states he would not want a feeding tube.  Decisions/Changes to ACP: Remain DNR No feeding tube  Assessment & Plan:   Impression: 81 year old male presents status post fall with left femur fracture status post IM nailing, developed postoperative A-fib and hypertension requiring ICU/stepdown and pressors which have now been discontinued.  He is also had acute blood loss anemia portion for  surgical procedure.  Noted slow progress and palliative consulted for goals of care conversations.  We confirmed his DNR status and further discussions on long-term implications of multiple chronic health conditions.  He would like to continue current treatments other than possibility of feeding tube (states in person would not want 1).  He has a firm understanding on trajectory of illness and the likelihood of an outcome consistent with her quality of life that he describes that he should deteriorate further.  SUMMARY OF RECOMMENDATIONS   Remain DNR No feeding tube GOC document completed Time for outcomes Continue to treat the treatable PMT will continue to follow peripherally Would recommend outpatient palliative care follow-up after discharge Please notify us of any significant clinical changes or new palliative needs  Symptom Management:  Per primary team PMT is available to assist as needed  Prognosis:  Unable to determine  Discharge Planning:  To Be Determined   Discussed with: Patient, medical team, nursing team    Thank you for allowing [place] to participate in the care of Jeremy Herrera PMT will continue  to support holistically.  Time Total: 95 min  Greater than 50%  of this time was spent counseling and coordinating care related to the above assessment and plan.  Signed by: Walden Field, NP Palliative Medicine Team  Team Phone # [phone] (Nights/Weekends)  11/02/2021, 4:34 PM

## 2021-11-02 NOTE — Progress Notes (Addendum)
PROGRESS NOTE    Jeremy Herrera  NOI:[id number] DOB: 16-Sep-1940 DOA: 10/25/2021 PCP: Marina Goodell, MD   Brief Narrative:  81 year old with history of chronic diastolic CHF, HTN, HLD, A-fib on aspirin and Plavix, BPH came to the ED with left hip pain on 9/5.  He was found to have left hip fracture requiring IM nailing of the left femur by orthopedic.  Hospital course complicated by atrial fibrillation with RVR associated with hypotension and acute blood loss anemia.  Cardiology team was consulted, started on phenylephrine drip, metoprolol 3 times daily.  Also required PRBC transfusion.   Assessment & Plan:  Principal Problem:   Closed displaced intertrochanteric fracture of left femur (HCC) Active Problems:   Depression   Diastolic CHF, chronic (HCC)   COPD (chronic obstructive pulmonary disease) (HCC)   Atrial fibrillation, chronic (HCC)   Postoperative atrial fibrillation w/ RVR and associated hypotension requiring vasopressor support in ICU   Hypotension after procedure   Malnutrition of moderate degree    Closed displaced intertrochanteric fracture of left femur (HCC) Status post mechanical fall with  closed intertrochanteric fracture of left femur requiring IM nailing of the left femur by orthopedic.  Postop management by their team.  Pain control, bowel regimen.  Outpatient follow-up with [facility] : [facility] clinic orthopedic in 2 weeks.  Lovenox for DVT prophylaxis.  Weightbearing as tolerated.   Postoperative atrial fibrillation w/ RVR and associated hypotension requiring vasopressor support in ICU Patient is not on chronic anticoagulation.  Has watchman in place since 2020.  Currently patient is on midodrine 10 mg 3 times daily, Lopressor 12.5 mg p.o. 3 times daily.  Requiring phenylephrine drip off-and-on Echocardiogram EF 60-65, normal LV function, no RWMA, diastolic parameters indeterminate Seen by cardiology. Monitor electrolytes.   Throat/tongue swelling - Subjective  complaint by patient.  No stridor.  No culprits identified.  We will place him on Solu-Medrol, Pepcid and given 1 dose of Benadryl as well.  We will continue to monitor.   Diastolic CHF, chronic (HCC) - does not appear to be in acute exacerbation Patient has a history of chronic diastolic dysfunction CHF with last known 2D echocardiogram showed an LVEF of 50% from 2022 Seen by cardiology team.   Depression Continue sertraline   COPD (chronic obstructive pulmonary disease) (HCC) Bronchiectasis, interstitial lung disease Stable and not acutely exacerbated though there is concern for chronic inflammation/ILD.  Will need outpatient CT scan.  Bronchodilators.   Anemia Uncertain etiology but significant drop Hgb seems more than shoule expect postop.  Hemoglobin is stable at 10.1.  Received total 2 units PRBC during this hospitalization.  Speech and swallow-dysphagia 3 diet   DVT prophylaxis: enoxaparin (LOVENOX) injection 40 mg Start: 11/01/21 2200 SCDs Start: 10/26/21 1107 Code Status: DNR Family Communication:  Updated Joannie. Requested to call after 3pm  Status is: Inpatient Maintain hospital stay until his pain is under better control in the meantime PT/OT thereafter placement.  Signs/Symptoms: moderate fat depletion, moderate muscle depletion     Body mass index is 28.68 kg/m.         Subjective: Patient seen and examined this morning, did not have any complaints.  Overnight had a brief run of NSVT but remained asymptomatic.  His A-fib for now is remain controlled Later on I received a message from nurse stating he was having throat swelling and difficulty.  I immediately examined at bedside, patient had a decent cough, no stridor, throat or neck swelling was seen visibly.  Patient says he feels better.  Denies any allergy.   Examination:  General exam: Appears calm and comfortable.  Elderly frail Respiratory system: Mild bibasilar rhonchi Cardiovascular system:  irregularly irregular Gastrointestinal system: Abdomen is nondistended, soft and nontender. No organomegaly or masses felt. Normal bowel sounds heard. Central nervous system: Alert and oriented. No focal neurological deficits. Extremities: Symmetric 5 x 5 power. Skin: No rashes, lesions or ulcers Psychiatry: Judgement and insight appear normal. Mood & affect appropriate.     Objective: Vitals:   11/01/21 1751 11/01/21 1801 11/01/21 2301 11/02/21 0816  BP: (!) 129/98  129/63 122/76  Pulse: 100  (!) 110 99  Resp: 17  16 17   Temp: 97.7 F (36.5 C)  98 F (36.7 C) 98 F (36.7 C)  TempSrc:    Oral  SpO2: 100%  100% 100%  Weight:  98.6 kg    Height:  6\' 1"  (1.854 m)      Intake/Output Summary (Last 24 hours) at 11/02/2021 0915 Last data filed at 11/02/2021 0400 Gross per 24 hour  Intake 237 ml  Output 800 ml  Net -563 ml   Filed Weights   10/25/21 1525 10/25/21 2105 11/01/21 1801  Weight: 93 kg 97.8 kg 98.6 kg     Data Reviewed:   CBC: Recent Labs  Lab 10/27/21 0606 10/27/21 2125 10/28/21 0402 10/29/21 0415 10/30/21 1701 11/01/21 0426  WBC 6.0  --  6.4 8.3  --  6.6  HGB 7.5* 8.2* 8.3* 8.3* 9.8* 10.1*  HCT 22.4* 24.5* 24.7* 24.7* 29.4* 30.7*  MCV 92.6  --  91.1 89.8  --  90.0  PLT 105*  --  89* 131*  --  153   Basic Metabolic Panel: Recent Labs  Lab 10/27/21 0606 10/27/21 1504 10/28/21 0402 10/29/21 0415 11/01/21 0426  NA 139  --  139 140 140  K 4.3  --  3.9 4.0 4.3  CL 113*  --  116* 109 112*  CO2 22  --  22 24 22   GLUCOSE 133*  --  122* 123* 109*  BUN 19  --  19 21 19   CREATININE 1.12  --  0.98 1.13 0.89  CALCIUM 7.9*  --  8.0* 8.3* 8.5*  MG  --  2.0 2.0  --   --    GFR: Estimated Creatinine Clearance: 79.1 mL/min (by C-G formula based on SCr of 0.89 mg/dL). Liver Function Tests: No results for input(s): "AST", "ALT", "ALKPHOS", "BILITOT", "PROT", "ALBUMIN" in the last 168 hours. No results for input(s): "LIPASE", "AMYLASE" in the last 168  hours. No results for input(s): "AMMONIA" in the last 168 hours. Coagulation Profile: No results for input(s): "INR", "PROTIME" in the last 168 hours. Cardiac Enzymes: No results for input(s): "CKTOTAL", "CKMB", "CKMBINDEX", "TROPONINI" in the last 168 hours. BNP (last 3 results) No results for input(s): "PROBNP" in the last 8760 hours. HbA1C: No results for input(s): "HGBA1C" in the last 72 hours. CBG: No results for input(s): "GLUCAP" in the last 168 hours. Lipid Profile: No results for input(s): "CHOL", "HDL", "LDLCALC", "TRIG", "CHOLHDL", "LDLDIRECT" in the last 72 hours. Thyroid Function Tests: No results for input(s): "TSH", "T4TOTAL", "FREET4", "T3FREE", "THYROIDAB" in the last 72 hours. Anemia Panel: No results for input(s): "VITAMINB12", "FOLATE", "FERRITIN", "TIBC", "IRON", "RETICCTPCT" in the last 72 hours. Sepsis Labs: No results for input(s): "PROCALCITON", "LATICACIDVEN" in the last 168 hours.  Recent Results (from the past 240 hour(s))  MRSA Next Gen by PCR, Nasal     Status: None   Collection Time: 10/25/21  9:07 PM   Specimen: Nasal Mucosa; Nasal Swab  Result Value Ref Range Status   MRSA by PCR Next Gen NOT DETECTED NOT DETECTED Final    Comment: (NOTE) The GeneXpert MRSA Assay (FDA approved for NASAL specimens only), is one component of a comprehensive MRSA colonization surveillance program. It is not intended to diagnose MRSA infection nor to guide or monitor treatment for MRSA infections. Test performance is not FDA approved in patients less than 30 years old. Performed at [facility], [address], [place], [place] [address]          Radiology Studies: No results found.      Scheduled Meds:  acetaminophen  1,000 mg Oral Q8H   Chlorhexidine Gluconate Cloth  6 each Topical Q0600   cyanocobalamin  1,000 mcg Oral Daily   docusate sodium  100 mg Oral BID   donepezil  5 mg Oral QHS   enoxaparin (LOVENOX) injection  40 mg Subcutaneous  QHS   feeding supplement  237 mL Oral TID BM   ferrous sulfate  325 mg Oral Daily   latanoprost  1 drop Both Eyes QHS   metoprolol tartrate  12.5 mg Oral TID   midodrine  10 mg Oral TID WC   multivitamin with minerals  1 tablet Oral Daily   pantoprazole  40 mg Oral BID AC   rosuvastatin  10 mg Oral Daily   senna  1 tablet Oral BID   sertraline  25 mg Oral Daily   umeclidinium-vilanterol  1 puff Inhalation Daily   Continuous Infusions:  methocarbamol (ROBAXIN) IV       LOS: 8 days   Time spent= 35 mins    Luceal Hollibaugh Joline Maxcy, MD [facility]  If 7PM-7AM, please contact night-coverage  11/02/2021, 9:15 AM

## 2021-11-02 NOTE — Care Management Important Message (Signed)
Important Message  Patient Details  Name: Jeremy Herrera MRN: [id number] Date of Birth: [birth year]   Medicare Important Message Given:  Yes     Lizeth Bencosme, Stephan Minister 11/02/2021, 1:32 PM

## 2021-11-03 DIAGNOSIS — S72142A Displaced intertrochanteric fracture of left femur, initial encounter for closed fracture: Secondary | ICD-10-CM | POA: Diagnosis not present

## 2021-11-03 DIAGNOSIS — I482 Chronic atrial fibrillation, unspecified: Secondary | ICD-10-CM | POA: Diagnosis not present

## 2021-11-03 DIAGNOSIS — J449 Chronic obstructive pulmonary disease, unspecified: Secondary | ICD-10-CM | POA: Diagnosis not present

## 2021-11-03 LAB — BASIC METABOLIC PANEL
Anion gap: 8 (ref 5–15)
BUN: 22 mg/dL (ref 8–23)
CO2: 22 mmol/L (ref 22–32)
Calcium: 9 mg/dL (ref 8.9–10.3)
Chloride: 110 mmol/L (ref 98–111)
Creatinine, Ser: 1 mg/dL (ref 0.61–1.24)
GFR, Estimated: >60 mL/min (ref >60)
Glucose, Bld: 197 mg/dL — ABNORMAL HIGH (ref 70–99)
Potassium: 4.5 mmol/L (ref 3.5–5.1)
Sodium: 140 mmol/L (ref 135–145)

## 2021-11-03 LAB — CBC
HCT: 33.6 % — ABNORMAL LOW (ref 39.0–52.0)
Hemoglobin: 11.2 g/dL — ABNORMAL LOW (ref 13.0–17.0)
MCH: 30.4 pg (ref 26.0–34.0)
MCHC: 33.3 g/dL (ref 30.0–36.0)
MCV: 91.3 fL (ref 80.0–100.0)
Platelets: 201 10*3/uL (ref 150–400)
RBC: 3.68 MIL/uL — ABNORMAL LOW (ref 4.22–5.81)
RDW: 15.7 % — ABNORMAL HIGH (ref 11.5–15.5)
WBC: 4.7 10*3/uL (ref 4.0–10.5)
nRBC: 0 % (ref 0.0–0.2)

## 2021-11-03 LAB — MAGNESIUM: Magnesium: 2.1 mg/dL (ref 1.7–2.4)

## 2021-11-03 MED ORDER — ORAL CARE MOUTH RINSE
15.0000 mL | OROMUCOSAL | Status: DC
  Administered 2021-11-03 – 2021-11-04 (×6): 15 mL via OROMUCOSAL

## 2021-11-03 MED ORDER — ORAL CARE MOUTH RINSE: 15.0000 mL | OROMUCOSAL | Status: DC | PRN

## 2021-11-03 NOTE — Progress Notes (Signed)
PROGRESS NOTE    Jeremy Herrera  XQJ:[id number] DOB: 1941-01-22 DOA: 10/25/2021 PCP: Marina Goodell, MD   Brief Narrative:  81 year old with history of chronic diastolic CHF, HTN, HLD, A-fib on aspirin and Plavix, BPH came to the ED with left hip pain on 9/5.  He was found to have left hip fracture requiring IM nailing of the left femur by orthopedic.  Hospital course complicated by atrial fibrillation with RVR associated with hypotension and acute blood loss anemia.  Cardiology team was consulted, started on phenylephrine drip, metoprolol 3 times daily.  Also required PRBC transfusion.  Patient was seen by PT/OT who recommended SNF.  Hemoglobin is stable.  Intermittently getting tachycardic with with up titration of Toprol-XL he becomes hypotensive therefore midodrine initiated.   Assessment & Plan:  Principal Problem:   Closed displaced intertrochanteric fracture of left femur (HCC) Active Problems:   Depression   Diastolic CHF, chronic (HCC)   COPD (chronic obstructive pulmonary disease) (HCC)   Atrial fibrillation, chronic (HCC)   Postoperative atrial fibrillation w/ RVR and associated hypotension requiring vasopressor support in ICU   Hypotension after procedure   Malnutrition of moderate degree    Closed displaced intertrochanteric fracture of left femur (HCC) Status post mechanical fall with  closed intertrochanteric fracture of left femur requiring IM nailing of the left femur by orthopedic.  Postop management by their team.  Pain control, bowel regimen.  Outpatient follow-up with [facility] clinic orthopedic in 2 weeks.  Lovenox for DVT prophylaxis.  Weightbearing as tolerated.   Postoperative atrial fibrillation w/ RVR and associated hypotension requiring vasopressor support in ICU Patient is not on chronic anticoagulation.  Has watchman in place since 2020.  Currently patient is on midodrine 10 mg 3 times daily, Lopressor 12.5 mg p.o. 3 times daily.  Requiring phenylephrine  drip off-and-on Echocardiogram EF 60-65, normal LV function, no RWMA, diastolic parameters indeterminate Seen by cardiology. Monitor electrolytes.   Chronic dysphagia - Initially thought throat and tongue swelling was acute but after having lengthy discussion with the patient and his daughter later on, this choking sensation has been very chronic and ongoing for several years.  He does have some component of neuromuscular issues causing oropharyngeal dysphagia.  He has been placed on modified diet.  At this time best approach would be aspiration precautions.  In the past patient has not necessarily followed all the instructions.  This puts him at high risk of aspiration which patient and daughter is aware of. We will go ahead and discontinue IV Solu-Medrol and Pepcid   Diastolic CHF, chronic (HCC) - does not appear to be in acute exacerbation Patient has a history of chronic diastolic dysfunction CHF with last known 2D echocardiogram showed an LVEF of 50% from 2022 Seen by cardiology team.   Depression Continue sertraline   COPD (chronic obstructive pulmonary disease) (HCC) Bronchiectasis, interstitial lung disease Stable and not acutely exacerbated though there is concern for chronic inflammation/ILD.  Will need outpatient CT scan.  Bronchodilators.   Anemia Uncertain etiology but significant drop Hgb seems more than shoule expect postop.  Hemoglobin is stable at 11.2.  Received total 2 units PRBC during this hospitalization.  Speech and swallow-dysphagia 3 diet  Unfortunately patient is at very high risk of recurrent admission given his chronic frailty, poor oral intake, risk of dehydration and multiple medical issues.  He is also had difficult to control atrial fibrillation.  I went ahead and consulted palliative care service to help further establish goals of care.  DVT prophylaxis: enoxaparin (LOVENOX) injection 40 mg Start: 11/01/21 2200 SCDs Start: 10/26/21 1107 Code Status:  DNR Family Communication: Family at bedside Status is: Inpatient TOC team working on placement  Signs/Symptoms: moderate fat depletion, moderate muscle depletion     Body mass index is 28.68 kg/m.         Subjective: Seen and examined at bedside, doing much better this morning.  Heart rate for the most part remains in 90s but with exertion it jumps up to 105-110.  Patient remains asymptomatic.  Tolerating p.o. intake.   Examination: Constitutional: Not in acute distress.  Elderly frail Respiratory: Minimal bibasilar crackles Cardiovascular: Normal sinus rhythm, no rubs Abdomen: Nontender nondistended good bowel sounds Musculoskeletal: No edema noted Skin: No rashes seen Neurologic: CN 2-12 grossly intact.  And nonfocal Psychiatric: Normal judgment and insight. Alert and oriented x 3. Normal mood.   Objective: Vitals:   11/02/21 0816 11/02/21 1242 11/02/21 1622 11/02/21 2352  BP: 122/76 109/77 118/67 93/80  Pulse: 99  (!) 103 92  Resp: 17  18 18   Temp: 98 F (36.7 C)  98.2 F (36.8 C) 97.7 F (36.5 C)  TempSrc: Oral  Oral   SpO2: 100%  100% 94%  Weight:      Height:        Intake/Output Summary (Last 24 hours) at 11/03/2021 0849 Last data filed at 11/03/2021 0644 Gross per 24 hour  Intake 364.73 ml  Output 2375 ml  Net -2010.27 ml   Filed Weights   10/25/21 1525 10/25/21 2105 11/01/21 1801  Weight: 93 kg 97.8 kg 98.6 kg     Data Reviewed:   CBC: Recent Labs  Lab 10/28/21 0402 10/29/21 0415 10/30/21 1701 11/01/21 0426 11/03/21 0624  WBC 6.4 8.3  --  6.6 4.7  HGB 8.3* 8.3* 9.8* 10.1* 11.2*  HCT 24.7* 24.7* 29.4* 30.7* 33.6*  MCV 91.1 89.8  --  90.0 91.3  PLT 89* 131*  --  153 201   Basic Metabolic Panel: Recent Labs  Lab 10/27/21 1504 10/28/21 0402 10/29/21 0415 11/01/21 0426 11/03/21 0624  NA  --  139 140 140 140  K  --  3.9 4.0 4.3 4.5  CL  --  116* 109 112* 110  CO2  --  22 24 22 22   GLUCOSE  --  122* 123* 109* 197*  BUN  --  19  21 19 22   CREATININE  --  0.98 1.13 0.89 1.00  CALCIUM  --  8.0* 8.3* 8.5* 9.0  MG 2.0 2.0  --   --  2.1   GFR: Estimated Creatinine Clearance: 70.4 mL/min (by C-G formula based on SCr of 1 mg/dL). Liver Function Tests: No results for input(s): "AST", "ALT", "ALKPHOS", "BILITOT", "PROT", "ALBUMIN" in the last 168 hours. No results for input(s): "LIPASE", "AMYLASE" in the last 168 hours. No results for input(s): "AMMONIA" in the last 168 hours. Coagulation Profile: No results for input(s): "INR", "PROTIME" in the last 168 hours. Cardiac Enzymes: No results for input(s): "CKTOTAL", "CKMB", "CKMBINDEX", "TROPONINI" in the last 168 hours. BNP (last 3 results) No results for input(s): "PROBNP" in the last 8760 hours. HbA1C: No results for input(s): "HGBA1C" in the last 72 hours. CBG: No results for input(s): "GLUCAP" in the last 168 hours. Lipid Profile: No results for input(s): "CHOL", "HDL", "LDLCALC", "TRIG", "CHOLHDL", "LDLDIRECT" in the last 72 hours. Thyroid Function Tests: No results for input(s): "TSH", "T4TOTAL", "FREET4", "T3FREE", "THYROIDAB" in the last 72 hours. Anemia Panel: No results for  input(s): "VITAMINB12", "FOLATE", "FERRITIN", "TIBC", "IRON", "RETICCTPCT" in the last 72 hours. Sepsis Labs: No results for input(s): "PROCALCITON", "LATICACIDVEN" in the last 168 hours.  Recent Results (from the past 240 hour(s))  MRSA Next Gen by PCR, Nasal     Status: None   Collection Time: 10/25/21  9:07 PM   Specimen: Nasal Mucosa; Nasal Swab  Result Value Ref Range Status   MRSA by PCR Next Gen NOT DETECTED NOT DETECTED Final    Comment: (NOTE) The GeneXpert MRSA Assay (FDA approved for NASAL specimens only), is one component of a comprehensive MRSA colonization surveillance program. It is not intended to diagnose MRSA infection nor to guide or monitor treatment for MRSA infections. Test performance is not FDA approved in patients less than 44 years old. Performed at  [facility], [address], [place], [place] [address]          Radiology Studies: No results found.      Scheduled Meds:  acetaminophen  1,000 mg Oral Q8H   cyanocobalamin  1,000 mcg Oral Daily   docusate sodium  100 mg Oral BID   donepezil  5 mg Oral QHS   enoxaparin (LOVENOX) injection  40 mg Subcutaneous QHS   feeding supplement  237 mL Oral TID BM   ferrous sulfate  325 mg Oral Daily   latanoprost  1 drop Both Eyes QHS   metoprolol succinate  37.5 mg Oral Daily   midodrine  10 mg Oral TID WC   multivitamin with minerals  1 tablet Oral Daily   mouth rinse  15 mL Mouth Rinse 4 times per day   pantoprazole  40 mg Oral BID AC   rosuvastatin  10 mg Oral Daily   senna  1 tablet Oral BID   sertraline  25 mg Oral Daily   umeclidinium-vilanterol  1 puff Inhalation Daily   Continuous Infusions:  methocarbamol (ROBAXIN) IV       LOS: 9 days   Time spent= 35 mins    Vyolet Sakuma Arsenio Loader, MD [facility]  If 7PM-7AM, please contact night-coverage  11/03/2021, 8:49 AM

## 2021-11-03 NOTE — Plan of Care (Signed)

## 2021-11-03 NOTE — Progress Notes (Signed)
Occupational Therapy Treatment Patient Details Name: Jeremy Herrera MRN: [id number] DOB: 18-Nov-1940 Today's Date: 11/03/2021   History of present illness Pt is 1 YOM admitted for displaced intertrochanteric fx of L femur, s/p IM nailing, a-fib w RVR and hypotension. PMH includes: dCHF, depression, COPD, anemia, R THA, A-fib, HTN, HLD.   OT comments  Jeremy Herrera was seen for OT treatment on this date. Upon arrival to room pt eating upright in bed, reports having just completed PT however agreeable to tx. Pt requires MIN A exit bed, HOB elevated and rail use. SUPERVISION + single UE support eating seated EOB. Completes seated therex as described below with good recall. MOD A lateral scoot along EOB - max HR 153, further mobility deferred, returned to supine. Pt making good progress toward goals, will continue to follow POC. Discharge recommendation remains appropriate.     Recommendations for follow up therapy are one component of a multi-disciplinary discharge planning process, led by the attending physician.  Recommendations may be updated based on patient status, additional functional criteria and insurance authorization.    Follow Up Recommendations  Skilled nursing-short term rehab (<3 hours/day)    Assistance Recommended at Discharge Frequent or constant Supervision/Assistance  Patient can return home with the following  Two people to help with walking and/or transfers;Two people to help with bathing/dressing/bathroom   Equipment Recommendations  Other (comment) (defer)    Recommendations for Other Services      Precautions / Restrictions Precautions Precautions: Fall Restrictions Weight Bearing Restrictions: Yes LLE Weight Bearing: Weight bearing as tolerated       Mobility Bed Mobility Overal bed mobility: Needs Assistance Bed Mobility: Supine to Sit, Sit to Supine     Supine to sit: Min assist, HOB elevated Sit to supine: Mod assist   General bed mobility comments:  assist for BLE return to bed    Transfers Overall transfer level: Needs assistance   Transfers: Bed to chair/wheelchair/BSC            Lateral/Scoot Transfers: Mod assist General transfer comment: verbal cues for technique     Balance Overall balance assessment: Needs assistance Sitting-balance support: Single extremity supported, Feet supported Sitting balance-Leahy Scale: Fair                                     ADL either performed or assessed with clinical judgement   ADL Overall ADL's : Needs assistance/impaired                                       General ADL Comments: SUPERVISION + single UE support eating seated EOB. MAX A don B socks in sitting.      Cognition Arousal/Alertness: Awake/alert Behavior During Therapy: WFL for tasks assessed/performed Overall Cognitive Status: Within Functional Limits for tasks assessed                                 General Comments: good recall of exercises, very pleasant and polite              General Comments HR 153 in sitting following lateral scoot, stansing deferred    Pertinent Vitals/ Pain       Pain Assessment Pain Assessment: Faces Faces Pain Scale: Hurts a little bit Pain Location:  L LE Pain Descriptors / Indicators: Sore Pain Intervention(s): Limited activity within patient's tolerance, Repositioned   Frequency  Min 2X/week        Progress Toward Goals  OT Goals(current goals can now be found in the care plan section)  Progress towards OT goals: Progressing toward goals  Acute Rehab OT Goals Patient Stated Goal: to go home OT Goal Formulation: With patient Time For Goal Achievement: 11/09/21 Potential to Achieve Goals: Good ADL Goals Pt Will Perform Grooming: with set-up;with supervision;sitting Pt Will Perform Lower Body Dressing: with min assist;sitting/lateral leans Pt Will Transfer to Toilet: with mod assist;stand pivot transfer  Plan  Discharge plan remains appropriate;Frequency remains appropriate    Co-evaluation                 AM-PAC OT "6 Clicks" Daily Activity     Outcome Measure   Help from another person eating meals?: None Help from another person taking care of personal grooming?: A Little Help from another person toileting, which includes using toliet, bedpan, or urinal?: A Lot Help from another person bathing (including washing, rinsing, drying)?: A Lot Help from another person to put on and taking off regular upper body clothing?: A Little Help from another person to put on and taking off regular lower body clothing?: A Lot 6 Click Score: 16    End of Session    OT Visit Diagnosis: Other abnormalities of gait and mobility (R26.89);Muscle weakness (generalized) (M62.81);Repeated falls (R29.6)   Activity Tolerance Patient tolerated treatment well   Patient Left in bed;with call bell/phone within reach;with bed alarm set   Nurse Communication          Time: 5400-8676 OT Time Calculation (min): 21 min  Charges: OT General Charges $OT Visit: 1 Visit OT Treatments $Therapeutic Exercise: 8-22 mins  Kathie Dike, M.S. OTR/L  11/03/21, 2:22 PM  ascom [phone]'

## 2021-11-03 NOTE — Progress Notes (Signed)
Physical Therapy Treatment Patient Details Name: Jeremy Herrera MRN: [id number] DOB: December 31, 1940 Today's Date: 11/03/2021   History of Present Illness Pt is 48 YOM admitted for displaced intertrochanteric fx of L femur, s/p IM nailing, a-fib w RVR and hypotension. PMH includes: dCHF, depression, COPD, anemia, R THA, A-fib, HTN, HLD.    PT Comments    Patient is agreeable to PT session and is making progress towards meeting PT goals. Increased activity tolerance this session. Patient was able to stand with +2 person assistance and take several steps with rolling walker. He had a standing tolerance of ~ 4 minutes today. No dizziness is reported with upright activity. Recommend to continue PT to maximize independence and decrease caregiver burden. SNF is recommended at discharge.      Recommendations for follow up therapy are one component of a multi-disciplinary discharge planning process, led by the attending physician.  Recommendations may be updated based on patient status, additional functional criteria and insurance authorization.  Follow Up Recommendations  Skilled nursing-short term rehab (<3 hours/day) Can patient physically be transported by private vehicle: No   Assistance Recommended at Discharge Frequent or constant Supervision/Assistance  Patient can return home with the following Two people to help with walking and/or transfers;Two people to help with bathing/dressing/bathroom;Help with stairs or ramp for entrance;Assist for transportation   Equipment Recommendations  None recommended by PT    Recommendations for Other Services       Precautions / Restrictions Precautions Precautions: Fall Restrictions Weight Bearing Restrictions: Yes LLE Weight Bearing: Weight bearing as tolerated     Mobility  Bed Mobility Overal bed mobility: Needs Assistance Bed Mobility: Supine to Sit, Sit to Supine     Supine to sit: Mod assist Sit to supine: Mod assist, +2 for physical  assistance   General bed mobility comments: verbal cues for sequencing and technique    Transfers Overall transfer level: Needs assistance Equipment used: Rolling walker (2 wheels) Transfers: Sit to/from Stand Sit to Stand: Mod assist, +2 physical assistance           General transfer comment: verbal cues for technique. increased time and effort required    Ambulation/Gait Ambulation/Gait assistance: Mod assist, +2 physical assistance Gait Distance (Feet): 2 Feet Assistive device: Rolling walker (2 wheels) Gait Pattern/deviations: Narrow base of support Gait velocity: decreased     General Gait Details: patient is able to take several side steps to the right along the edge of bed. verbal cues  and faciliation for weight shifting for advanacement of RLE. no dizziness reported with activity.   Stairs             Wheelchair Mobility    Modified Rankin (Stroke Patients Only)       Balance   Sitting-balance support: Feet supported Sitting balance-Leahy Scale: Fair Sitting balance - Comments: no external support required to maintain sitting balance   Standing balance support: Bilateral upper extremity supported Standing balance-Leahy Scale: Poor Standing balance comment: external support required to maintain standing balance with standing tolerance of ~ 4 minutes                            Cognition Arousal/Alertness: Awake/alert Behavior During Therapy: WFL for tasks assessed/performed Overall Cognitive Status: Within Functional Limits for tasks assessed  General Comments: patient is able to follow all commands with extra time. cooperative throughout        Exercises      General Comments General comments (skin integrity, edema, etc.): HR 153 in sitting following lateral scoot, stansing deferred      Pertinent Vitals/Pain Pain Assessment Pain Assessment: 0-10 Pain Score: 1  Pain Location: L  LE Pain Descriptors / Indicators: Sore    Home Living                          Prior Function            PT Goals (current goals can now be found in the care plan section) Acute Rehab PT Goals Patient Stated Goal: to get to rehab PT Goal Formulation: With patient Time For Goal Achievement: 11/09/21 Potential to Achieve Goals: Fair Progress towards PT goals: Progressing toward goals    Frequency    7X/week      PT Plan Current plan remains appropriate    Co-evaluation              AM-PAC PT "6 Clicks" Mobility   Outcome Measure  Help needed turning from your back to your side while in a flat bed without using bedrails?: A Lot Help needed moving from lying on your back to sitting on the side of a flat bed without using bedrails?: A Lot Help needed moving to and from a bed to a chair (including a wheelchair)?: Total Help needed standing up from a chair using your arms (e.g., wheelchair or bedside chair)?: A Lot Help needed to walk in hospital room?: Total Help needed climbing 3-5 steps with a railing? : Total 6 Click Score: 9    End of Session   Activity Tolerance: Patient tolerated treatment well Patient left: in bed;with call bell/phone within reach;with bed alarm set Nurse Communication: Mobility status PT Visit Diagnosis: Muscle weakness (generalized) (M62.81);Difficulty in walking, not elsewhere classified (R26.2);Unsteadiness on feet (R26.81);Pain Pain - Right/Left: Left Pain - part of body: Hip     Time: 2683-4196 PT Time Calculation (min) (ACUTE ONLY): 23 min  Charges:  $Therapeutic Activity: 23-37 mins                    Donna Bernard, PT, MPT   Ina Homes 11/03/2021, 2:21 PM

## 2021-11-03 NOTE — Plan of Care (Signed)
  Problem: Education: Goal: Knowledge of General Education information will improve Description: Including pain rating scale, medication(s)/side effects and non-pharmacologic comfort measures 11/03/2021 1300 by Berneta Sages, RN Outcome: Progressing 11/03/2021 1300 by Berneta Sages, RN Outcome: Progressing   Problem: Health Behavior/Discharge Planning: Goal: Ability to manage health-related needs will improve 11/03/2021 1300 by Berneta Sages, RN Outcome: Progressing 11/03/2021 1300 by Berneta Sages, RN Outcome: Progressing   Problem: Clinical Measurements: Goal: Ability to maintain clinical measurements within normal limits will improve 11/03/2021 1300 by Berneta Sages, RN Outcome: Progressing 11/03/2021 1300 by Berneta Sages, RN Outcome: Progressing Goal: Will remain free from infection 11/03/2021 1300 by Berneta Sages, RN Outcome: Progressing 11/03/2021 1300 by Berneta Sages, RN Outcome: Progressing Goal: Diagnostic test results will improve 11/03/2021 1300 by Berneta Sages, RN Outcome: Progressing 11/03/2021 1300 by Berneta Sages, RN Outcome: Progressing Goal: Respiratory complications will improve 11/03/2021 1300 by Berneta Sages, RN Outcome: Progressing 11/03/2021 1300 by Berneta Sages, RN Outcome: Progressing Goal: Cardiovascular complication will be avoided 11/03/2021 1300 by Berneta Sages, RN Outcome: Progressing 11/03/2021 1300 by Berneta Sages, RN Outcome: Progressing   Problem: Activity: Goal: Risk for activity intolerance will decrease 11/03/2021 1300 by Berneta Sages, RN Outcome: Progressing 11/03/2021 1300 by Berneta Sages, RN Outcome: Progressing   Problem: Nutrition: Goal: Adequate nutrition will be maintained 11/03/2021 1300 by Berneta Sages, RN Outcome: Progressing 11/03/2021 1300 by Berneta Sages, RN Outcome: Progressing   Problem: Coping: Goal: Level  of anxiety will decrease 11/03/2021 1300 by Berneta Sages, RN Outcome: Progressing 11/03/2021 1300 by Berneta Sages, RN Outcome: Progressing   Problem: Elimination: Goal: Will not experience complications related to bowel motility 11/03/2021 1300 by Berneta Sages, RN Outcome: Progressing 11/03/2021 1300 by Berneta Sages, RN Outcome: Progressing Goal: Will not experience complications related to urinary retention 11/03/2021 1300 by Berneta Sages, RN Outcome: Progressing 11/03/2021 1300 by Berneta Sages, RN Outcome: Progressing   Problem: Pain Managment: Goal: General experience of comfort will improve 11/03/2021 1300 by Berneta Sages, RN Outcome: Progressing 11/03/2021 1300 by Berneta Sages, RN Outcome: Progressing   Problem: Safety: Goal: Ability to remain free from injury will improve 11/03/2021 1300 by Berneta Sages, RN Outcome: Progressing 11/03/2021 1300 by Berneta Sages, RN Outcome: Progressing   Problem: Skin Integrity: Goal: Risk for impaired skin integrity will decrease 11/03/2021 1300 by Berneta Sages, RN Outcome: Progressing 11/03/2021 1300 by Berneta Sages, RN Outcome: Progressing   Problem: Education: Goal: Knowledge of disease or condition will improve 11/03/2021 1300 by Berneta Sages, RN Outcome: Progressing 11/03/2021 1300 by Berneta Sages, RN Outcome: Progressing Goal: Understanding of medication regimen will improve 11/03/2021 1300 by Berneta Sages, RN Outcome: Progressing 11/03/2021 1300 by Berneta Sages, RN Outcome: Progressing Goal: Individualized Educational Video(s) 11/03/2021 1300 by Berneta Sages, RN Outcome: Progressing 11/03/2021 1300 by Berneta Sages, RN Outcome: Progressing   Problem: Activity: Goal: Ability to tolerate increased activity will improve 11/03/2021 1300 by Berneta Sages, RN Outcome: Progressing 11/03/2021 1300 by Berneta Sages, RN Outcome: Progressing   Problem: Cardiac: Goal: Ability to achieve and maintain adequate cardiopulmonary perfusion will improve 11/03/2021 1300 by Berneta Sages, RN Outcome: Progressing 11/03/2021 1300 by Berneta Sages, RN Outcome: Progressing   Problem: Health Behavior/Discharge Planning: Goal: Ability to safely manage health-related needs after discharge will improve 11/03/2021 1300 by Berneta Sages, RN Outcome: Progressing 11/03/2021 1300 by Berneta Sages, RN Outcome: Progressing

## 2021-11-03 NOTE — TOC Progression Note (Addendum)
Transition of Care [facility]) - Progression Note    Patient Details  Name: Jeremy Herrera MRN: [id number] Date of Birth: [birth year]  Transition of Care [facility]) CM/SW Contact  Marlowe Sax, RN Phone Number: 11/03/2021, 11:54 AM  Clinical Narrative:    [facility] called and stated that they will need updated progress notes  and therapy notes showing he can tolerate therapy to go to [facility] once Ins approved   Expected Discharge Plan: Skilled Nursing Facility Barriers to Discharge: Continued Medical Work up  Expected Discharge Plan and Services Expected Discharge Plan: Skilled Nursing Facility   Discharge Planning Services: CM Consult Post Acute Care Choice: Skilled Nursing Facility Living arrangements for the past 2 months: Single Family Home                 DME Arranged: N/A DME Agency: NA       HH Arranged: NA HH Agency: NA         Social Determinants of Health (SDOH) Interventions    Readmission Risk Interventions     No data to display

## 2021-11-03 NOTE — Progress Notes (Signed)
  Daily Progress Note   Patient Name: Jeremy Herrera       Date: 11/03/2021 DOB: 1940-04-01  Age: 81 y.o. MRN#: [id number] Attending Physician: Dimple Nanas, MD Primary Care Physician: Marina Goodell, MD Admit Date: 10/25/2021 Length of Stay: 9 days  Reason for Consultation/Follow-up: {Reason for Consult:23484}  HPI/Patient Profile:  ***  Subjective:   Subjective: Chart Reviewed. Updates received. Patient Assessed. Created space and opportunity for patient  and family to explore thoughts and feelings regarding current medical situation.  Today's Discussion: ***  Review of Systems  Objective:   Vital Signs:  BP 93/80 (BP Location: Right Arm) Comment: Notified Brandi of pt BP  Pulse 92   Temp 97.7 F (36.5 C)   Resp 18   Ht 6\' 1"  (1.854 m)   Wt 98.6 kg   SpO2 94%   BMI 28.68 kg/m   Physical Exam: Physical Exam  Palliative Assessment/Data: ***   Assessment & Plan:   Impression: Present on Admission:  Depression  Diastolic CHF, chronic (HCC)  COPD (chronic obstructive pulmonary disease) (HCC)  Atrial fibrillation, chronic (HCC)  ***  SUMMARY OF RECOMMENDATIONS   ***  Symptom Management:  ***  Code Status: {Palliative Code status:23503}  Prognosis: {Palliative Care Prognosis:23504}  Discharge Planning: {Palliative dispostion:23505}  Discussed with: ***  Thank you for allowing to participate in the care of [place] PMT will continue to support holistically.  Time Total: ***  Visit consisted of counseling and education dealing with the complex and emotionally intense issues of symptom management and palliative care in the setting of serious and potentially life-threatening illness. Greater than 50%  of this time was spent counseling and coordinating care related to the above assessment and plan.  Elenor Quinones, NP Palliative Medicine Team  Team Phone # [phone] (Nights/Weekends)  10/19/2020, 8:17 AM

## 2021-11-04 DIAGNOSIS — E119 Type 2 diabetes mellitus without complications: Secondary | ICD-10-CM | POA: Diagnosis present

## 2021-11-04 DIAGNOSIS — I951 Orthostatic hypotension: Secondary | ICD-10-CM | POA: Diagnosis present

## 2021-11-04 DIAGNOSIS — Z95818 Presence of other cardiac implants and grafts: Secondary | ICD-10-CM | POA: Diagnosis not present

## 2021-11-04 DIAGNOSIS — R000 Tachycardia, unspecified: Secondary | ICD-10-CM | POA: Diagnosis not present

## 2021-11-04 DIAGNOSIS — I5042 Chronic combined systolic (congestive) and diastolic (congestive) heart failure: Secondary | ICD-10-CM | POA: Diagnosis not present

## 2021-11-04 DIAGNOSIS — Z8249 Family history of ischemic heart disease and other diseases of the circulatory system: Secondary | ICD-10-CM | POA: Diagnosis not present

## 2021-11-04 DIAGNOSIS — G319 Degenerative disease of nervous system, unspecified: Secondary | ICD-10-CM | POA: Diagnosis not present

## 2021-11-04 DIAGNOSIS — I5032 Chronic diastolic (congestive) heart failure: Secondary | ICD-10-CM | POA: Diagnosis present

## 2021-11-04 DIAGNOSIS — X58XXXD Exposure to other specified factors, subsequent encounter: Secondary | ICD-10-CM | POA: Diagnosis present

## 2021-11-04 DIAGNOSIS — W19XXXD Unspecified fall, subsequent encounter: Secondary | ICD-10-CM | POA: Diagnosis not present

## 2021-11-04 DIAGNOSIS — R609 Edema, unspecified: Secondary | ICD-10-CM | POA: Diagnosis not present

## 2021-11-04 DIAGNOSIS — E440 Moderate protein-calorie malnutrition: Secondary | ICD-10-CM | POA: Diagnosis not present

## 2021-11-04 DIAGNOSIS — Z818 Family history of other mental and behavioral disorders: Secondary | ICD-10-CM | POA: Diagnosis not present

## 2021-11-04 DIAGNOSIS — R4189 Other symptoms and signs involving cognitive functions and awareness: Secondary | ICD-10-CM | POA: Diagnosis not present

## 2021-11-04 DIAGNOSIS — S72141D Displaced intertrochanteric fracture of right femur, subsequent encounter for closed fracture with routine healing: Secondary | ICD-10-CM | POA: Diagnosis not present

## 2021-11-04 DIAGNOSIS — R4182 Altered mental status, unspecified: Secondary | ICD-10-CM | POA: Diagnosis not present

## 2021-11-04 DIAGNOSIS — J449 Chronic obstructive pulmonary disease, unspecified: Secondary | ICD-10-CM | POA: Diagnosis present

## 2021-11-04 DIAGNOSIS — Z89422 Acquired absence of other left toe(s): Secondary | ICD-10-CM | POA: Diagnosis not present

## 2021-11-04 DIAGNOSIS — R5381 Other malaise: Secondary | ICD-10-CM | POA: Diagnosis not present

## 2021-11-04 DIAGNOSIS — E782 Mixed hyperlipidemia: Secondary | ICD-10-CM | POA: Diagnosis not present

## 2021-11-04 DIAGNOSIS — Z885 Allergy status to narcotic agent status: Secondary | ICD-10-CM | POA: Diagnosis not present

## 2021-11-04 DIAGNOSIS — R55 Syncope and collapse: Secondary | ICD-10-CM | POA: Diagnosis not present

## 2021-11-04 DIAGNOSIS — M6281 Muscle weakness (generalized): Secondary | ICD-10-CM | POA: Diagnosis not present

## 2021-11-04 DIAGNOSIS — Z7982 Long term (current) use of aspirin: Secondary | ICD-10-CM | POA: Diagnosis not present

## 2021-11-04 DIAGNOSIS — S72142A Displaced intertrochanteric fracture of left femur, initial encounter for closed fracture: Secondary | ICD-10-CM | POA: Diagnosis not present

## 2021-11-04 DIAGNOSIS — K219 Gastro-esophageal reflux disease without esophagitis: Secondary | ICD-10-CM | POA: Diagnosis present

## 2021-11-04 DIAGNOSIS — S72142D Displaced intertrochanteric fracture of left femur, subsequent encounter for closed fracture with routine healing: Secondary | ICD-10-CM | POA: Diagnosis not present

## 2021-11-04 DIAGNOSIS — R531 Weakness: Secondary | ICD-10-CM | POA: Diagnosis present

## 2021-11-04 DIAGNOSIS — Z20822 Contact with and (suspected) exposure to covid-19: Secondary | ICD-10-CM | POA: Diagnosis present

## 2021-11-04 DIAGNOSIS — R400 Somnolence: Secondary | ICD-10-CM | POA: Diagnosis not present

## 2021-11-04 DIAGNOSIS — N400 Enlarged prostate without lower urinary tract symptoms: Secondary | ICD-10-CM | POA: Diagnosis present

## 2021-11-04 DIAGNOSIS — I639 Cerebral infarction, unspecified: Secondary | ICD-10-CM | POA: Diagnosis not present

## 2021-11-04 DIAGNOSIS — E785 Hyperlipidemia, unspecified: Secondary | ICD-10-CM | POA: Diagnosis present

## 2021-11-04 DIAGNOSIS — F32A Depression, unspecified: Secondary | ICD-10-CM | POA: Diagnosis present

## 2021-11-04 DIAGNOSIS — R4781 Slurred speech: Secondary | ICD-10-CM | POA: Diagnosis present

## 2021-11-04 DIAGNOSIS — Z7401 Bed confinement status: Secondary | ICD-10-CM | POA: Diagnosis not present

## 2021-11-04 DIAGNOSIS — D62 Acute posthemorrhagic anemia: Secondary | ICD-10-CM | POA: Diagnosis not present

## 2021-11-04 DIAGNOSIS — Z833 Family history of diabetes mellitus: Secondary | ICD-10-CM | POA: Diagnosis not present

## 2021-11-04 DIAGNOSIS — I482 Chronic atrial fibrillation, unspecified: Secondary | ICD-10-CM | POA: Diagnosis present

## 2021-11-04 DIAGNOSIS — K5904 Chronic idiopathic constipation: Secondary | ICD-10-CM | POA: Diagnosis not present

## 2021-11-04 DIAGNOSIS — I480 Paroxysmal atrial fibrillation: Secondary | ICD-10-CM | POA: Diagnosis not present

## 2021-11-04 DIAGNOSIS — R29898 Other symptoms and signs involving the musculoskeletal system: Secondary | ICD-10-CM | POA: Diagnosis not present

## 2021-11-04 DIAGNOSIS — Z66 Do not resuscitate: Secondary | ICD-10-CM | POA: Diagnosis present

## 2021-11-04 DIAGNOSIS — Z79899 Other long term (current) drug therapy: Secondary | ICD-10-CM | POA: Diagnosis not present

## 2021-11-04 DIAGNOSIS — I2510 Atherosclerotic heart disease of native coronary artery without angina pectoris: Secondary | ICD-10-CM | POA: Diagnosis present

## 2021-11-04 DIAGNOSIS — I110 Hypertensive heart disease with heart failure: Secondary | ICD-10-CM | POA: Diagnosis present

## 2021-11-04 DIAGNOSIS — I4891 Unspecified atrial fibrillation: Secondary | ICD-10-CM | POA: Diagnosis not present

## 2021-11-04 DIAGNOSIS — J849 Interstitial pulmonary disease, unspecified: Secondary | ICD-10-CM | POA: Diagnosis not present

## 2021-11-04 DIAGNOSIS — R410 Disorientation, unspecified: Secondary | ICD-10-CM | POA: Diagnosis not present

## 2021-11-04 DIAGNOSIS — R682 Dry mouth, unspecified: Secondary | ICD-10-CM | POA: Diagnosis present

## 2021-11-04 DIAGNOSIS — R1319 Other dysphagia: Secondary | ICD-10-CM | POA: Diagnosis not present

## 2021-11-04 DIAGNOSIS — R0689 Other abnormalities of breathing: Secondary | ICD-10-CM | POA: Diagnosis not present

## 2021-11-04 LAB — CBC
HCT: 30.3 % — ABNORMAL LOW (ref 39.0–52.0)
Hemoglobin: 9.9 g/dL — ABNORMAL LOW (ref 13.0–17.0)
MCH: 30.1 pg (ref 26.0–34.0)
MCHC: 32.7 g/dL (ref 30.0–36.0)
MCV: 92.1 fL (ref 80.0–100.0)
Platelets: 223 10*3/uL (ref 150–400)
RBC: 3.29 MIL/uL — ABNORMAL LOW (ref 4.22–5.81)
RDW: 16.2 % — ABNORMAL HIGH (ref 11.5–15.5)
WBC: 11.5 10*3/uL — ABNORMAL HIGH (ref 4.0–10.5)
nRBC: 0 % (ref 0.0–0.2)

## 2021-11-04 LAB — BASIC METABOLIC PANEL
Anion gap: 7 (ref 5–15)
BUN: 31 mg/dL — ABNORMAL HIGH (ref 8–23)
CO2: 23 mmol/L (ref 22–32)
Calcium: 8.4 mg/dL — ABNORMAL LOW (ref 8.9–10.3)
Chloride: 112 mmol/L — ABNORMAL HIGH (ref 98–111)
Creatinine, Ser: 1.03 mg/dL (ref 0.61–1.24)
GFR, Estimated: >60 mL/min (ref >60)
Glucose, Bld: 183 mg/dL — ABNORMAL HIGH (ref 70–99)
Potassium: 4.1 mmol/L (ref 3.5–5.1)
Sodium: 142 mmol/L (ref 135–145)

## 2021-11-04 LAB — MAGNESIUM: Magnesium: 2.2 mg/dL (ref 1.7–2.4)

## 2021-11-04 MED ORDER — METOPROLOL SUCCINATE ER 25 MG PO TB24: 37.5000 mg | ORAL_TABLET | Freq: Every day | ORAL | Status: DC

## 2021-11-04 MED ORDER — BISACODYL 10 MG RE SUPP: 10.0000 mg | Freq: Every day | RECTAL | 0 refills | Status: DC | PRN

## 2021-11-04 MED ORDER — SENNA 8.6 MG PO TABS: 1.0000 | ORAL_TABLET | Freq: Every day | ORAL | 0 refills | Status: DC

## 2021-11-04 MED ORDER — MIDODRINE HCL 10 MG PO TABS: 10.0000 mg | ORAL_TABLET | Freq: Three times a day (TID) | ORAL | Status: DC

## 2021-11-04 MED ORDER — ENOXAPARIN SODIUM 40 MG/0.4ML IJ SOSY: 40.0000 mg | PREFILLED_SYRINGE | Freq: Every day | INTRAMUSCULAR | Status: DC

## 2021-11-04 MED ORDER — DOCUSATE SODIUM 100 MG PO CAPS: 100.0000 mg | ORAL_CAPSULE | Freq: Two times a day (BID) | ORAL | 0 refills | Status: DC | PRN

## 2021-11-04 MED ORDER — PANTOPRAZOLE SODIUM 40 MG PO TBEC: 40.0000 mg | DELAYED_RELEASE_TABLET | Freq: Two times a day (BID) | ORAL | Status: DC

## 2021-11-04 MED ORDER — FUROSEMIDE 40 MG PO TABS: 40.0000 mg | ORAL_TABLET | Freq: Every day | ORAL | Status: DC | PRN

## 2021-11-04 NOTE — Plan of Care (Signed)
Patient discharged per MD orders at this time.All discharge instructions,education & medications reviewed with the patient.Pt expressed understanding and will comply with dc instructions.follow up appointments was also communicated to the patient.no verbal c/o or any ssx of distress.Pt was discharged to the [facility] rehab center for STR/PT/OT services per order.report was called to staff nurse Marylene Land walker before transport.Pt was transported by two [facility] personnel on a stretcher.

## 2021-11-04 NOTE — TOC Progression Note (Signed)
Transition of Care [facility]) - Progression Note    Patient Details  Name: KUMAR FALWELL MRN: [id number] Date of Birth: [birth year]  Transition of Care [facility]) CM/SW Contact  Marlowe Sax, RN Phone Number: 11/04/2021, 8:57 AM  Clinical Narrative:     Called the patient's daughter Richrd Sox and left a general VM requesting a call back, the patient will go to room 401 at [facility]  Expected Discharge Plan: Skilled Nursing Facility Barriers to Discharge: Continued Medical Work up  Expected Discharge Plan and Services Expected Discharge Plan: Skilled Nursing Facility   Discharge Planning Services: CM Consult Post Acute Care Choice: Skilled Nursing Facility Living arrangements for the past 2 months: Single Family Home Expected Discharge Date: 11/04/21               DME Arranged: N/A DME Agency: NA       HH Arranged: NA HH Agency: NA         Social Determinants of Health (SDOH) Interventions    Readmission Risk Interventions     No data to display

## 2021-11-04 NOTE — TOC Progression Note (Signed)
Transition of Care [facility]) - Progression Note    Patient Details  Name: Jeremy Herrera MRN: [id number] Date of Birth: [birth year]  Transition of Care [facility]) CM/SW Contact  Marlowe Sax, RN Phone Number: 11/04/2021, 9:28 AM  Clinical Narrative:     Spoke with his daughter Richrd Sox and let her know the room number 401 at [facility], EMS called and he is 2nd on list  Expected Discharge Plan: Skilled Nursing Facility Barriers to Discharge: Continued Medical Work up  Expected Discharge Plan and Services Expected Discharge Plan: Skilled Nursing Facility   Discharge Planning Services: CM Consult Post Acute Care Choice: Skilled Nursing Facility Living arrangements for the past 2 months: Single Family Home Expected Discharge Date: 11/04/21               DME Arranged: N/A DME Agency: NA       HH Arranged: NA HH Agency: NA         Social Determinants of Health (SDOH) Interventions    Readmission Risk Interventions     No data to display

## 2021-11-04 NOTE — Discharge Summary (Addendum)
Physician Discharge Summary  LAURENCE CROFFORD ZHY:[id number] DOB: 1940-04-29 DOA: 10/25/2021  PCP: Marina Goodell, MD  Admit date: 10/25/2021 Discharge date: 11/04/2021  Admitted From: Home Disposition:  SNF  Recommendations for Outpatient Follow-up:  Follow up with PCP in 1-2 weeks Please obtain BMP/CBC in one week your next doctors visit.  Bowel regimen started Due to low blood pressure-lisinopril discontinued.  Lasix changed to as needed.  Toprol-XL reduced to 37.5 mg daily.  These medications can be adjusted as necessary. Aggressive bowel regimen has been added.  Pain medications as necessary. PPI twice daily. With exertion patient's heart rate does increase to 105-120. Dysphagia 3 diet recommended.  Fluid consistency nectar thick.  He is at risk of aspiration. MOST form has been filled out and will be sent with the patient Outpatient follow-up with orthopedic [facility] in 2 weeks. Lovenox for DVT prophylaxis.  If unable to administer this, can consider aspirin 81 mg twice daily. Due to chronic lung changes, outpatient CT scan of his chest during his PCP follow-up is recommended.   Discharge Condition: Stable CODE STATUS: DNR/DNI Diet recommendation: Dysphagia 3 diet  Brief/Interim Summary: 81 year old with history of chronic diastolic CHF, HTN, HLD, A-fib on aspirin and Plavix, BPH came to the ED with left hip pain on 9/5.  He was found to have left hip fracture requiring IM nailing of the left femur by orthopedic.  Hospital course complicated by atrial fibrillation with RVR associated with hypotension and acute blood loss anemia.  Cardiology team was consulted, started on phenylephrine drip, metoprolol 3 times daily.  Also required PRBC transfusion.  Patient was seen by PT/OT who recommended SNF.  Hemoglobin is stable.  Intermittently getting tachycardic with with up titration of Toprol-XL he becomes hypotensive therefore midodrine initiated.  Medications were adjusted as  mentioned above.  They may need to be further changed depending on his tolerance.     Assessment & Plan:  Principal Problem:   Closed displaced intertrochanteric fracture of left femur (HCC) Active Problems:   Depression   Diastolic CHF, chronic (HCC)   COPD (chronic obstructive pulmonary disease) (HCC)   Atrial fibrillation, chronic (HCC)   Postoperative atrial fibrillation w/ RVR and associated hypotension requiring vasopressor support in ICU   Hypotension after procedure   Malnutrition of moderate degree     Closed displaced intertrochanteric fracture of left femur (HCC) Status post mechanical fall with  closed intertrochanteric fracture of left femur requiring IM nailing of the left femur by orthopedic.  Postop management by their team.  Pain control, bowel regimen.  Outpatient follow-up with [facility] orthopedic in 2 weeks.  Lovenox for DVT prophylaxis.  If unable to administer Lovenox, aspirin 81 mg twice daily.  Weightbearing as tolerated.   Postoperative atrial fibrillation w/ RVR and associated hypotension requiring vasopressor support in ICU Patient is not on chronic anticoagulation.  Has watchman in place since 2020.  Currently patient is on midodrine 10 mg 3 times daily, Toprol-XL 37.5 mg daily.  Depending on his blood pressure trends, these medications can further be adjusted. Echocardiogram EF 60-65, normal LV function, no RWMA, diastolic parameters indeterminate Seen by cardiology. Repeat lab in next 3-5 days   Chronic dysphagia - Initially thought throat and tongue swelling was acute but after having lengthy discussion with the patient and his daughter later on, this choking sensation has been very chronic and ongoing for several years.  He does have some component of neuromuscular issues causing oropharyngeal dysphagia.  He has been placed  on modified diet.  At this time best approach would be aspiration precautions.  In the past patient has not necessarily followed  all the instructions.  This puts him at high risk of aspiration which patient and daughter is aware of.    Diastolic CHF, chronic (HCC) - does not appear to be in acute exacerbation Patient has a history of chronic diastolic dysfunction CHF with last known 2D echocardiogram showed an LVEF of 50% from 2022 Seen by cardiology team.   Depression Continue sertraline   COPD (chronic obstructive pulmonary disease) (HCC) Bronchiectasis, interstitial lung disease Stable and not acutely exacerbated though there is concern for chronic inflammation/ILD.  Will need outpatient CT scan.  Bronchodilators.   Anemia Uncertain etiology but significant drop Hgb seems more than shoule expect postop.  Hemoglobin is stable at 11.2.  Received total 2 units PRBC during this hospitalization.   Speech and swallow-dysphagia 3 diet   Unfortunately patient is at very high risk of recurrent admission given his chronic frailty, poor oral intake, risk of dehydration and multiple medical issues.  He is also had difficult to control atrial fibrillation.  He has been seen by palliative care service     Discharge Diagnoses:  Principal Problem:   Closed displaced intertrochanteric fracture of left femur (HCC) Active Problems:   Depression   Diastolic CHF, chronic (HCC)   COPD (chronic obstructive pulmonary disease) (HCC)   Atrial fibrillation, chronic (HCC)   Postoperative atrial fibrillation w/ RVR and associated hypotension requiring vasopressor support in ICU   Hypotension after procedure   Malnutrition of moderate degree      Consultations: Palliative care Cardiology  Subjective: Feels great no complaints.  Heart rate is better controlled.  Discharge Exam: Vitals:   11/03/21 2251 11/04/21 0808  BP: 133/72 (!) 131/95  Pulse: 97 95  Resp: 18 16  Temp: 98.3 F (36.8 C) (!) 97.4 F (36.3 C)  SpO2: 99% 100%   Vitals:   11/02/21 2352 11/03/21 1053 11/03/21 2251 11/04/21 0808  BP: 93/80 127/89  133/72 (!) 131/95  Pulse: 92 88 97 95  Resp: 18  18 16   Temp: 97.7 F (36.5 C)  98.3 F (36.8 C) (!) 97.4 F (36.3 C)  TempSrc:      SpO2: 94%  99% 100%  Weight:      Height:        General: Pt is alert, awake, not in acute distress, chronically ill and weak appearing Cardiovascular: Irregularly irregular, S1/S2 +, no rubs, no gallops Respiratory: CTA bilaterally, no wheezing, no rhonchi Abdominal: Soft, NT, ND, bowel sounds + Extremities: no edema, no cyanosis  Discharge Instructions   Allergies as of 11/04/2021       Reactions   Hydrocodone-acetaminophen Nausea And Vomiting        Medication List     STOP taking these medications    esomeprazole 20 MG capsule Commonly known as: NEXIUM   lisinopril 2.5 MG tablet Commonly known as: ZESTRIL   nitroGLYCERIN 0.4 MG SL tablet Commonly known as: NITROSTAT   potassium chloride 10 MEQ tablet Commonly known as: KLOR-CON       TAKE these medications    albuterol 108 (90 Base) MCG/ACT inhaler Commonly known as: VENTOLIN HFA Inhale 1-2 puffs into the lungs every 4 (four) hours as needed.   aspirin 81 MG tablet Take 81 mg by mouth daily.   bisacodyl 10 MG suppository Commonly known as: DULCOLAX Place 1 suppository (10 mg total) rectally daily as needed for moderate  constipation.   cyanocobalamin 1000 MCG tablet Commonly known as: VITAMIN B12 Take 1 tablet (1,000 mcg total) by mouth daily.   docusate sodium 100 MG capsule Commonly known as: COLACE Take 1 capsule (100 mg total) by mouth 2 (two) times daily as needed for mild constipation.   ferrous sulfate 325 (65 FE) MG tablet Take 325 mg by mouth daily.   Fluticasone-Salmeterol 250-50 MCG/DOSE Aepb Commonly known as: ADVAIR Inhale 1 puff into the lungs as needed.   furosemide 40 MG tablet Commonly known as: LASIX Take 1 tablet (40 mg total) by mouth daily as needed for fluid or edema. What changed:  when to take this reasons to take this    methocarbamol 500 MG tablet Commonly known as: ROBAXIN Take 1 tablet (500 mg total) by mouth every 6 (six) hours as needed for muscle spasms.   metoprolol succinate 25 MG 24 hr tablet Commonly known as: TOPROL-XL Take 1.5 tablets (37.5 mg total) by mouth daily. What changed:  medication strength how much to take   midodrine 10 MG tablet Commonly known as: PROAMATINE Take 1 tablet (10 mg total) by mouth 3 (three) times daily with meals.   ondansetron 4 MG tablet Commonly known as: ZOFRAN Take 1 tablet (4 mg total) by mouth every 6 (six) hours as needed for nausea.   oxyCODONE 5 MG immediate release tablet Commonly known as: Oxy IR/ROXICODONE Take 0.5-1 tablets (2.5-5 mg total) by mouth every 4 (four) hours as needed for moderate pain (pain score 4-6).   pantoprazole 40 MG tablet Commonly known as: PROTONIX Take 1 tablet (40 mg total) by mouth 2 (two) times daily before a meal.   Remedy Phytoplex Hydraguard Crea Apply topically.   rosuvastatin 10 MG tablet Commonly known as: CRESTOR Take 1 tablet by mouth daily.   senna 8.6 MG Tabs tablet Commonly known as: SENOKOT Take 1 tablet (8.6 mg total) by mouth daily.   sertraline 25 MG tablet Commonly known as: ZOLOFT Take 25 mg by mouth daily.   simethicone 125 MG chewable tablet Commonly known as: MYLICON Chew 125 mg by mouth every 6 (six) hours as needed.   traMADol 50 MG tablet Commonly known as: ULTRAM Take 1 tablet (50 mg total) by mouth every 6 (six) hours as needed for moderate pain.   umeclidinium-vilanterol 62.5-25 MCG/INH Aepb Commonly known as: ANORO ELLIPTA Inhale 1 puff into the lungs daily as needed. Takes daily        Contact information for follow-up providers     Dedra Skeens, PA-C Follow up in 2 week(s).   Specialty: Orthopedic Surgery Why: For staple removal and x-rays of the left hip Contact information: [address] [place] [address] [phone]          Marina Goodell, MD Follow up in 1 week(s).   Specialty: Family Medicine Contact information: [address] [place] [address] [phone]              Contact information for after-discharge care     Destination     [facility]erred [facility] .   Service: Skilled Nursing Contact information: [address] [place] [address] [phone]                    Allergies  Allergen Reactions   Hydrocodone-Acetaminophen Nausea And Vomiting    You were cared for by a hospitalist during your hospital  stay. If you have any questions about your discharge medications or the care you received while you were in the hospital after you are discharged, you can call the unit and asked to speak with the hospitalist on call if the hospitalist that took care of you is not available. Once you are discharged, your primary care physician will handle any further medical issues. Please note that no refills for any discharge medications will be authorized once you are discharged, as it is imperative that you return to your primary care physician (or establish a relationship with a primary care physician if you do not have one) for your aftercare needs so that they can reassess your need for medications and monitor your lab values.   Procedures/Studies: DG Chest Port 1 View  Result Date: 10/28/2021 CLINICAL DATA:  Shortness of breath, chest pain. EXAM: PORTABLE CHEST 1 VIEW COMPARISON:  October 25, 2021. FINDINGS: Stable cardiomegaly. Increased coarse reticular opacities are noted throughout both lungs concerning for edema or atypical inflammation. Elevated right hemidiaphragm is noted. Bony thorax is unremarkable. IMPRESSION: Increased bilateral lung opacities are noted concerning for edema or atypical inflammation. Electronically Signed   By: Lupita Raider M.D.   On: 10/28/2021 08:31   CT  ABDOMEN PELVIS WO CONTRAST  Result Date: 10/27/2021 CLINICAL DATA:  Retroperitoneal bleed suspected eval for retroperitoneal bleed EXAM: CT ABDOMEN AND PELVIS WITHOUT CONTRAST TECHNIQUE: Multidetector CT imaging of the abdomen and pelvis was performed following the standard protocol without IV contrast. RADIATION DOSE REDUCTION: This exam was performed according to the departmental dose-optimization program which includes automated exposure control, adjustment of the mA and/or kV according to patient size and/or use of iterative reconstruction technique. COMPARISON:  CT abdomen pelvis 12/10/2011 trauma x-ray left hip 10/25/2021 FINDINGS: Lower chest: Peribronchovascular consolidation within the visualized lung bases. Peripheral reticulations noted. Left heart Amplatzer device noted. Aortic vascular calcifications. At least 3 vessel coronary artery calcification. Cardiac findings suggestive of anemia trace left pleural effusion. Hepatobiliary: No focal liver abnormality. No gallstones, gallbladder wall thickening, or pericholecystic fluid. No biliary dilatation. Pancreas: Diffusely atrophic. No focal lesion. Otherwise normal pancreatic contour. No surrounding inflammatory changes. No main pancreatic ductal dilatation. Spleen: Normal in size without focal abnormality. Adrenals/Urinary Tract: No adrenal nodule bilaterally. Bilateral kidneys enhance symmetrically. No hydronephrosis. No hydroureter. The urinary bladder is unremarkable. Stomach/Bowel: Stomach is within normal limits. No evidence of bowel wall thickening or dilatation. Colonic diverticulosis. The appendix is not definitely identified with no inflammatory changes in the right lower quadrant to suggest acute appendicitis. Vascular/Lymphatic: No abdominal aorta or iliac aneurysm. Moderate to severe atherosclerotic plaque of the aorta and its branches. No abdominal, pelvic, or inguinal lymphadenopathy. Reproductive: Not visualized due to streak artifact  originating from bilateral femoral surgical hardware. Other: No intraperitoneal free fluid. No intraperitoneal free gas. No organized fluid collection. No retroperitoneal hematoma formation. Musculoskeletal: No abdominal wall hernia or abnormality. Subcutaneus soft tissue edema and emphysema along the left hip. Left hip 2.5 x 1.8 cm subcutaneus soft tissue hematoma formation. Overlying skin staples. Atrophic degeneration of the right iliopsoas muscle. No suspicious lytic or blastic osseous lesions. No acute displaced fracture. Multilevel degenerative changes of the spine. Total right hip arthroplasty partially visualized. Proximal femur fracture status post intramedullary fixation partially visualized. IMPRESSION: 1. No retroperitoneal hematoma. 2. Colonic diverticulosis with no acute diverticulitis. 3. Cardiac findings suggestive of anemia. 4. Peribronchovascular consolidation within the visualized lung bases suggestive of acute infection/inflammation. Peripheral reticulations likely representing underlying fibrosis. 5. Trace left pleural  effusion. 6.  Aortic Atherosclerosis (ICD10-I70.0). Electronically Signed   By: Tish Frederickson M.D.   On: 10/27/2021 18:09   ECHOCARDIOGRAM COMPLETE  Result Date: 10/27/2021    ECHOCARDIOGRAM REPORT   Patient Name:   DELMAS FAUCETT Date of Exam: 10/27/2021 Medical Rec #:  [id number]         Height:       73.0 in Accession #:    [id number]        Weight:       215.6 lb Date of Birth:  06/13/1940          BSA:          2.221 m Patient Age:    81 years          BP:           71/61 mmHg Patient Gender: M                 HR:           117 bpm. Exam Location:  [facility] Procedure: 2D Echo, Cardiac Doppler and Color Doppler Indications:     Atrial Fibrillation I 48.91  History:         Patient has prior history of Echocardiogram examinations, most                  recent 05/17/2019. CHF; Risk Factors:Diabetes, Hypertension and                  Dyslipidemia.  Sonographer:     Cristela Blue  Referring Phys:  [id number] Sunnie Nielsen Diagnosing Phys: Julien Nordmann MD  Sonographer Comments: Suboptimal apical window and Technically challenging study due to limited acoustic windows. IMPRESSIONS  1. Left ventricular ejection fraction, by estimation, is 60 to 65%. The left ventricle has normal function. The left ventricle has no regional wall motion abnormalities. Left ventricular diastolic parameters are indeterminate.  2. Right ventricular systolic function is normal. The right ventricular size is normal. There is normal pulmonary artery systolic pressure. The estimated right ventricular systolic pressure is 17.7 mmHg.  3. Left atrial size was severely dilated.  4. The mitral valve is normal in structure. No evidence of mitral valve regurgitation. No evidence of mitral stenosis.  5. The aortic valve was not well visualized. There is moderate calcification of the aortic valve. Aortic valve regurgitation is not visualized. Aortic valve sclerosis is present, with no evidence of aortic valve stenosis. Aortic valve area, by VTI measures 1.29 cm. Aortic valve mean gradient measures 6.3 mmHg. Aortic valve Vmax measures 1.69 m/s.  6. The inferior vena cava is normal in size with greater than 50% respiratory variability, suggesting right atrial pressure of 3 mmHg. FINDINGS  Left Ventricle: Left ventricular ejection fraction, by estimation, is 60 to 65%. The left ventricle has normal function. The left ventricle has no regional wall motion abnormalities. The left ventricular internal cavity size was normal in size. There is  no left ventricular hypertrophy. Left ventricular diastolic parameters are indeterminate. Right Ventricle: The right ventricular size is normal. No increase in right ventricular wall thickness. Right ventricular systolic function is normal. There is normal pulmonary artery systolic pressure. The tricuspid regurgitant velocity is 1.78 m/s, and  with an assumed right atrial pressure of 5 mmHg,  the estimated right ventricular systolic pressure is 17.7 mmHg. Left Atrium: Left atrial size was severely dilated. Right Atrium: Right atrial size was normal in size. Pericardium: There is no evidence of pericardial effusion. Mitral Valve: The  mitral valve is normal in structure. No evidence of mitral valve regurgitation. No evidence of mitral valve stenosis. Tricuspid Valve: The tricuspid valve is normal in structure. Tricuspid valve regurgitation is mild . No evidence of tricuspid stenosis. Aortic Valve: The aortic valve was not well visualized. There is moderate calcification of the aortic valve. Aortic valve regurgitation is not visualized. Aortic valve sclerosis is present, with no evidence of aortic valve stenosis. Aortic valve mean gradient measures 6.3 mmHg. Aortic valve peak gradient measures 11.4 mmHg. Aortic valve area, by VTI measures 1.29 cm. Pulmonic Valve: The pulmonic valve was normal in structure. Pulmonic valve regurgitation is not visualized. No evidence of pulmonic stenosis. Aorta: The aortic root is normal in size and structure. Venous: The inferior vena cava is normal in size with greater than 50% respiratory variability, suggesting right atrial pressure of 3 mmHg. IAS/Shunts: No atrial level shunt detected by color flow Doppler.  LEFT VENTRICLE PLAX 2D LVIDd:         5.00 cm LVIDs:         4.15 cm LV PW:         1.40 cm LV IVS:        1.10 cm LVOT diam:     2.00 cm LV SV:         40 LV SV Index:   18 LVOT Area:     3.14 cm  RIGHT VENTRICLE RV Basal diam:  4.10 cm RV S prime:     11.10 cm/s TAPSE (M-mode): 2.7 cm LEFT ATRIUM            Index        RIGHT ATRIUM           Index LA diam:      5.50 cm  2.48 cm/m   RA Area:     21.90 cm LA Vol (A2C): 104.0 ml 46.82 ml/m  RA Volume:   70.70 ml  31.83 ml/m LA Vol (A4C): 184.0 ml 82.83 ml/m  AORTIC VALVE AV Area (Vmax):    1.15 cm AV Area (Vmean):   1.05 cm AV Area (VTI):     1.29 cm AV Vmax:           169.00 cm/s AV Vmean:          117.667  cm/s AV VTI:            0.307 m AV Peak Grad:      11.4 mmHg AV Mean Grad:      6.3 mmHg LVOT Vmax:         62.00 cm/s LVOT Vmean:        39.400 cm/s LVOT VTI:          0.126 m LVOT/AV VTI ratio: 0.41  AORTA Ao Root diam: 3.60 cm MITRAL VALVE                TRICUSPID VALVE MV Area (PHT): 4.63 cm     TR Peak grad:   12.7 mmHg MV Decel Time: 164 msec     TR Vmax:        178.00 cm/s MV E velocity: 112.00 cm/s                             SHUNTS                             Systemic VTI:  0.13  m                             Systemic Diam: 2.00 cm Julien Nordmann MD Electronically signed by Julien Nordmann MD Signature Date/Time: 10/27/2021/4:19:09 PM    Final    DG HIP UNILAT WITH PELVIS 2-3 VIEWS LEFT  Result Date: 10/25/2021 CLINICAL DATA:  ORIF LEFT femur EXAM: DG HIP (WITH OR WITHOUT PELVIS) 2-3V LEFT COMPARISON:  10/25/2021 Fluoroscopy time: 3 minutes 47 seconds Dose: Not provided; please refer to operative records Images: 6 FINDINGS: Osseous demineralization. IM nail with 2 compression screws placed across an intertrochanteric fracture LEFT femur. Significant improvement in fracture fragment alignment since previous exam. Distal locking screw present. IMPRESSION: Post nailing of intertrochanteric fracture LEFT femur. Electronically Signed   By: Ulyses Southward M.D.   On: 10/25/2021 18:42   DG C-Arm 1-60 Min-No Report  Result Date: 10/25/2021 Fluoroscopy was utilized by the requesting physician.  No radiographic interpretation.   CT CHEST WO CONTRAST  Result Date: 10/25/2021 CLINICAL DATA:  Fall.  Pleural effusion. EXAM: CT CHEST WITHOUT CONTRAST TECHNIQUE: Multidetector CT imaging of the chest was performed following the standard protocol without IV contrast. RADIATION DOSE REDUCTION: This exam was performed according to the departmental dose-optimization program which includes automated exposure control, adjustment of the mA and/or kV according to patient size and/or use of iterative reconstruction technique.  COMPARISON:  Radiograph of same day.  CT scan of Jul 04, 2017. FINDINGS: Cardiovascular: Atherosclerosis of thoracic aorta is noted without aneurysm formation. Aortic valves calcifications are noted. Normal cardiac size. No pericardial effusion. Mild coronary artery calcifications are noted. Mediastinum/Nodes: No enlarged mediastinal or axillary lymph nodes. Thyroid gland, trachea, and esophagus demonstrate no significant findings. Lungs/Pleura: No pneumothorax or pleural effusion is noted. Reticular densities are noted peripherally in both lungs suggesting chronic interstitial lung disease. Interval development of 6 mm nodule seen in left lower lobe best seen on image number 121 of series 3. Multiple patchy airspace opacities are noted in the right lung which may represent multifocal inflammation, or potentially severe fibrosis secondary to chronic scarring or inflammation. Some degree of mosaic pattern is seen in the left lung suggesting multifocal inflammation or air trapping secondary to small airways disease. Some degree of bronchiectasis is noted in both lower lobes. Upper Abdomen: No acute abnormality. Musculoskeletal: No chest wall mass or suspicious bone lesions identified. IMPRESSION: There is interval development of multiple patchy airspace opacities in the right lung which may represent multifocal inflammation or pneumonia, or potentially severe masslike fibrosis secondary to chronic scarring or inflammation. Some degree of mosaic pattern is also noted in the left lung suggesting multifocal inflammation or air trapping secondary to small airways disease. There are again noted reticular densities noted peripherally throughout both lungs suggesting chronic interstitial lung disease, as well as some degree of bronchiectasis seen in both lower lobes, most consistent with chronic findings. Interval development of small nodule seen in left lower lobe. Non-contrast chest CT at 6-12 months is recommended. If the  nodule is stable at time of repeat CT, then future CT at 18-24 months (from today's scan) is considered optional for low-risk patients, but is recommended for high-risk patients. This recommendation follows the consensus statement: Guidelines for Management of Incidental Pulmonary Nodules Detected on CT Images: From the Fleischner Society 2017; Radiology 2017; 284:228-243. Mild coronary artery calcifications are noted. Aortic valve calcifications are noted. Aortic Atherosclerosis (ICD10-I70.0). Electronically Signed   By: Fayrene Fearing  Christen Butter M.D.   On: 10/25/2021 14:56   DG Chest 1 View  Result Date: 10/25/2021 CLINICAL DATA:  Fall EXAM: CHEST  1 VIEW COMPARISON:  Radiograph 05/16/2019 FINDINGS: Enlarged cardiac silhouette. Appendage occlusion device noted. There is pulmonary vascular congestion with interstitial prominent bilaterally, left greater than right. No pleural effusion. No pneumothorax. No acute osseous abnormality identified on frontal radiographs of the chest. IMPRESSION: Cardiomegaly with pulmonary vascular congestion and interstitial prominence bilaterally, left greater than right, which could reflect a degree of interstitial edema. Electronically Signed   By: Caprice Renshaw M.D.   On: 10/25/2021 11:57   DG Hip Unilat With Pelvis 2-3 Views Left  Result Date: 10/25/2021 CLINICAL DATA:  Fall, pain EXAM: DG HIP (WITH OR WITHOUT PELVIS) 2-3V LEFT COMPARISON:  None Available. FINDINGS: Osteopenia. Comminuted, displaced and impacted intratrochanteric fractures of the left hip. Status post right hip total arthroplasty. No displaced fracture or dislocation of the bony pelvis. Nonobstructive pattern of overlying bowel gas. IMPRESSION: 1. Comminuted, displaced and impacted intratrochanteric fractures of the left hip. 2. Status post right hip total arthroplasty. 3. No displaced fracture or dislocation of the bony pelvis. Electronically Signed   By: Jearld Lesch M.D.   On: 10/25/2021 11:52     The results of  significant diagnostics from this hospitalization (including imaging, microbiology, ancillary and laboratory) are listed below for reference.     Microbiology: Recent Results (from the past 240 hour(s))  MRSA Next Gen by PCR, Nasal     Status: None   Collection Time: 10/25/21  9:07 PM   Specimen: Nasal Mucosa; Nasal Swab  Result Value Ref Range Status   MRSA by PCR Next Gen NOT DETECTED NOT DETECTED Final    Comment: (NOTE) The GeneXpert MRSA Assay (FDA approved for NASAL specimens only), is one component of a comprehensive MRSA colonization surveillance program. It is not intended to diagnose MRSA infection nor to guide or monitor treatment for MRSA infections. Test performance is not FDA approved in patients less than 31 years old. Performed at [facility], [address] [place], [place] [address]      Labs: BNP (last 3 results) No results for input(s): "BNP" in the last 8760 hours. Basic Metabolic Panel: Recent Labs  Lab 10/29/21 0415 11/01/21 0426 11/03/21 0624 11/04/21 0312  NA 140 140 140 142  K 4.0 4.3 4.5 4.1  CL 109 112* 110 112*  CO2 GLUCOSE 123* 109* 197* 183*  BUN 31*  CREATININE 1.13 0.89 1.00 1.03  CALCIUM 8.3* 8.5* 9.0 8.4*  MG  --   --  2.1 2.2   Liver Function Tests: No results for input(s): "AST", "ALT", "ALKPHOS", "BILITOT", "PROT", "ALBUMIN" in the last 168 hours. No results for input(s): "LIPASE", "AMYLASE" in the last 168 hours. No results for input(s): "AMMONIA" in the last 168 hours. CBC: Recent Labs  Lab 10/29/21 0415 10/30/21 1701 11/01/21 0426 11/03/21 0624 11/04/21 0312  WBC 8.3  --  6.6 4.7 11.5*  HGB 8.3* 9.8* 10.1* 11.2* 9.9*  HCT 24.7* 29.4* 30.7* 33.6* 30.3*  MCV 89.8  --  90.0 91.3 92.1  PLT 131*  --  153 201 223   Cardiac Enzymes: No results for input(s): "CKTOTAL", "CKMB", "CKMBINDEX", "TROPONINI" in the last 168 hours. BNP: Invalid input(s): "POCBNP" CBG: No results for input(s):  "GLUCAP" in the last 168 hours. D-Dimer No results for input(s): "DDIMER" in the last 72 hours. Hgb A1c No results for  input(s): "HGBA1C" in the last 72 hours. Lipid Profile No results for input(s): "CHOL", "HDL", "LDLCALC", "TRIG", "CHOLHDL", "LDLDIRECT" in the last 72 hours. Thyroid function studies No results for input(s): "TSH", "T4TOTAL", "T3FREE", "THYROIDAB" in the last 72 hours.  Invalid input(s): "FREET3" Anemia work up No results for input(s): "VITAMINB12", "FOLATE", "FERRITIN", "TIBC", "IRON", "RETICCTPCT" in the last 72 hours. Urinalysis    Component Value Date/Time   COLORURINE YELLOW (A) 03/31/2018 1203   APPEARANCEUR CLEAR (A) 03/31/2018 1203   APPEARANCEUR Clear 04/02/2013 1415   LABSPEC 1.020 03/31/2018 1203   LABSPEC 1.015 04/02/2013 1415   PHURINE 5.0 03/31/2018 1203   GLUCOSEU NEGATIVE 03/31/2018 1203   GLUCOSEU Negative 04/02/2013 1415   HGBUR MODERATE (A) 03/31/2018 1203   BILIRUBINUR NEGATIVE 03/31/2018 1203   BILIRUBINUR Negative 04/02/2013 1415   KETONESUR NEGATIVE 03/31/2018 1203   PROTEINUR NEGATIVE 03/31/2018 1203   NITRITE NEGATIVE 03/31/2018 1203   LEUKOCYTESUR TRACE (A) 03/31/2018 1203   LEUKOCYTESUR Negative 04/02/2013 1415   Sepsis Labs Recent Labs  Lab 10/29/21 0415 11/01/21 0426 11/03/21 0624 11/04/21 0312  WBC 8.3 6.6 4.7 11.5*   Microbiology Recent Results (from the past 240 hour(s))  MRSA Next Gen by PCR, Nasal     Status: None   Collection Time: 10/25/21  9:07 PM   Specimen: Nasal Mucosa; Nasal Swab  Result Value Ref Range Status   MRSA by PCR Next Gen NOT DETECTED NOT DETECTED Final    Comment: (NOTE) The GeneXpert MRSA Assay (FDA approved for NASAL specimens only), is one component of a comprehensive MRSA colonization surveillance program. It is not intended to diagnose MRSA infection nor to guide or monitor treatment for MRSA infections. Test performance is not FDA approved in patients less than 71 years old. Performed  at [facility], [address] [place], [place] [address]      Time coordinating discharge:  I have spent 35 minutes face to face with the patient and on the ward discussing the patients care, assessment, plan and disposition with other care givers. >50% of the time was devoted counseling the patient about the risks and benefits of treatment/Discharge disposition and coordinating care.   SIGNED:   Dimple Nanas, MD  [facility] 11/04/2021, 8:57 AM   If 7PM-7AM, please contact night-coverage

## 2021-11-04 NOTE — TOC Progression Note (Signed)
Transition of Care [facility]) - Progression Note    Patient Details  Name: Jeremy Herrera MRN: [id number] Date of Birth: [birth year]  [facility] CM/SW Contact  Marlowe Sax, RN Phone Number: 11/04/2021, 8:49 AM  Clinical Narrative:    9/15 [employer] auth approved to go to [employer] ID [id number] 9/14-9/18, made Verlon Au aware   Expected Discharge Plan: Skilled Nursing Facility Barriers to Discharge: Continued Medical Work up  Expected Discharge Plan and Services Expected Discharge Plan: Skilled Nursing Facility   Discharge Planning Services: CM Consult Post Acute Care Choice: Skilled Nursing Facility Living arrangements for the past 2 months: Single Family Home                 DME Arranged: N/A DME Agency: NA       HH Arranged: NA HH Agency: NA         Social Determinants of Health (SDOH) Interventions    Readmission Risk Interventions     No data to display

## 2021-11-04 NOTE — Progress Notes (Signed)
1050 Dr Nelson Chimes made aware that nurse attempted x2 to straight cath pt. Unsuccessful has hx of BPH. Pt voided , bladder scan showed . Verbal order to cancel straight cath and proceed with d/c via EMS to SNF. Primary nurse and CM made aware.

## 2021-11-04 NOTE — Care Management Important Message (Signed)
Important Message  Patient Details  Name: Jeremy Herrera MRN: [id number] Date of Birth: 1941/02/11   Medicare Important Message Given:  Yes     Bernadette Hoit 11/04/2021, 1:35 PM

## 2021-11-04 NOTE — Plan of Care (Signed)

## 2021-11-07 DIAGNOSIS — E782 Mixed hyperlipidemia: Secondary | ICD-10-CM | POA: Diagnosis not present

## 2021-11-07 DIAGNOSIS — K5904 Chronic idiopathic constipation: Secondary | ICD-10-CM | POA: Diagnosis not present

## 2021-11-07 DIAGNOSIS — F32A Depression, unspecified: Secondary | ICD-10-CM | POA: Diagnosis not present

## 2021-11-07 DIAGNOSIS — D62 Acute posthemorrhagic anemia: Secondary | ICD-10-CM | POA: Diagnosis not present

## 2021-11-07 DIAGNOSIS — M6281 Muscle weakness (generalized): Secondary | ICD-10-CM | POA: Diagnosis not present

## 2021-11-07 DIAGNOSIS — S72141D Displaced intertrochanteric fracture of right femur, subsequent encounter for closed fracture with routine healing: Secondary | ICD-10-CM | POA: Diagnosis not present

## 2021-11-07 DIAGNOSIS — I5042 Chronic combined systolic (congestive) and diastolic (congestive) heart failure: Secondary | ICD-10-CM | POA: Diagnosis not present

## 2021-11-07 DIAGNOSIS — J449 Chronic obstructive pulmonary disease, unspecified: Secondary | ICD-10-CM | POA: Diagnosis not present

## 2021-11-07 DIAGNOSIS — R1319 Other dysphagia: Secondary | ICD-10-CM | POA: Diagnosis not present

## 2021-11-08 DIAGNOSIS — K5904 Chronic idiopathic constipation: Secondary | ICD-10-CM | POA: Diagnosis not present

## 2021-11-08 DIAGNOSIS — J449 Chronic obstructive pulmonary disease, unspecified: Secondary | ICD-10-CM | POA: Diagnosis not present

## 2021-11-08 DIAGNOSIS — S72141D Displaced intertrochanteric fracture of right femur, subsequent encounter for closed fracture with routine healing: Secondary | ICD-10-CM | POA: Diagnosis not present

## 2021-11-08 DIAGNOSIS — R1319 Other dysphagia: Secondary | ICD-10-CM | POA: Diagnosis not present

## 2021-11-08 DIAGNOSIS — E782 Mixed hyperlipidemia: Secondary | ICD-10-CM | POA: Diagnosis not present

## 2021-11-08 DIAGNOSIS — I5032 Chronic diastolic (congestive) heart failure: Secondary | ICD-10-CM | POA: Diagnosis not present

## 2021-11-08 DIAGNOSIS — F32A Depression, unspecified: Secondary | ICD-10-CM | POA: Diagnosis not present

## 2021-11-08 DIAGNOSIS — M6281 Muscle weakness (generalized): Secondary | ICD-10-CM | POA: Diagnosis not present

## 2021-11-08 DIAGNOSIS — D62 Acute posthemorrhagic anemia: Secondary | ICD-10-CM | POA: Diagnosis not present

## 2021-11-10 ENCOUNTER — Other Ambulatory Visit: Payer: Self-pay

## 2021-11-10 ENCOUNTER — Inpatient Hospital Stay
Admission: EM | Admit: 2021-11-10 | Discharge: 2021-11-16 | DRG: 312 | Disposition: A | Discharge status: Skilled Nursing Facility | Payer: Medicare PPO | Source: Skilled Nursing Facility | Attending: Hospitalist | Admitting: Hospitalist

## 2021-11-10 ENCOUNTER — Emergency Department: Payer: Medicare PPO

## 2021-11-10 ENCOUNTER — Observation Stay: Payer: Medicare PPO

## 2021-11-10 ENCOUNTER — Encounter: Payer: Self-pay | Admitting: Family Medicine

## 2021-11-10 DIAGNOSIS — Z8249 Family history of ischemic heart disease and other diseases of the circulatory system: Secondary | ICD-10-CM

## 2021-11-10 DIAGNOSIS — I110 Hypertensive heart disease with heart failure: Secondary | ICD-10-CM | POA: Diagnosis present

## 2021-11-10 DIAGNOSIS — I5032 Chronic diastolic (congestive) heart failure: Secondary | ICD-10-CM | POA: Diagnosis present

## 2021-11-10 DIAGNOSIS — F32A Depression, unspecified: Secondary | ICD-10-CM | POA: Diagnosis present

## 2021-11-10 DIAGNOSIS — N400 Enlarged prostate without lower urinary tract symptoms: Secondary | ICD-10-CM | POA: Diagnosis present

## 2021-11-10 DIAGNOSIS — I639 Cerebral infarction, unspecified: Secondary | ICD-10-CM | POA: Diagnosis not present

## 2021-11-10 DIAGNOSIS — R000 Tachycardia, unspecified: Secondary | ICD-10-CM | POA: Diagnosis not present

## 2021-11-10 DIAGNOSIS — S72142D Displaced intertrochanteric fracture of left femur, subsequent encounter for closed fracture with routine healing: Secondary | ICD-10-CM | POA: Diagnosis not present

## 2021-11-10 DIAGNOSIS — R531 Weakness: Secondary | ICD-10-CM | POA: Diagnosis present

## 2021-11-10 DIAGNOSIS — Z7401 Bed confinement status: Secondary | ICD-10-CM

## 2021-11-10 DIAGNOSIS — R682 Dry mouth, unspecified: Secondary | ICD-10-CM | POA: Diagnosis present

## 2021-11-10 DIAGNOSIS — Z79899 Other long term (current) drug therapy: Secondary | ICD-10-CM

## 2021-11-10 DIAGNOSIS — Z7982 Long term (current) use of aspirin: Secondary | ICD-10-CM

## 2021-11-10 DIAGNOSIS — Z95818 Presence of other cardiac implants and grafts: Secondary | ICD-10-CM | POA: Diagnosis not present

## 2021-11-10 DIAGNOSIS — E119 Type 2 diabetes mellitus without complications: Secondary | ICD-10-CM | POA: Diagnosis present

## 2021-11-10 DIAGNOSIS — K219 Gastro-esophageal reflux disease without esophagitis: Secondary | ICD-10-CM | POA: Diagnosis present

## 2021-11-10 DIAGNOSIS — E440 Moderate protein-calorie malnutrition: Secondary | ICD-10-CM | POA: Diagnosis not present

## 2021-11-10 DIAGNOSIS — I2510 Atherosclerotic heart disease of native coronary artery without angina pectoris: Secondary | ICD-10-CM | POA: Diagnosis present

## 2021-11-10 DIAGNOSIS — Z89422 Acquired absence of other left toe(s): Secondary | ICD-10-CM | POA: Diagnosis not present

## 2021-11-10 DIAGNOSIS — J849 Interstitial pulmonary disease, unspecified: Secondary | ICD-10-CM | POA: Diagnosis not present

## 2021-11-10 DIAGNOSIS — Z833 Family history of diabetes mellitus: Secondary | ICD-10-CM

## 2021-11-10 DIAGNOSIS — Z885 Allergy status to narcotic agent status: Secondary | ICD-10-CM

## 2021-11-10 DIAGNOSIS — R4189 Other symptoms and signs involving cognitive functions and awareness: Secondary | ICD-10-CM | POA: Diagnosis not present

## 2021-11-10 DIAGNOSIS — I4891 Unspecified atrial fibrillation: Secondary | ICD-10-CM | POA: Diagnosis present

## 2021-11-10 DIAGNOSIS — Z66 Do not resuscitate: Secondary | ICD-10-CM | POA: Diagnosis present

## 2021-11-10 DIAGNOSIS — X58XXXD Exposure to other specified factors, subsequent encounter: Secondary | ICD-10-CM | POA: Diagnosis present

## 2021-11-10 DIAGNOSIS — I951 Orthostatic hypotension: Secondary | ICD-10-CM | POA: Diagnosis present

## 2021-11-10 DIAGNOSIS — J449 Chronic obstructive pulmonary disease, unspecified: Secondary | ICD-10-CM | POA: Diagnosis present

## 2021-11-10 DIAGNOSIS — E785 Hyperlipidemia, unspecified: Secondary | ICD-10-CM | POA: Diagnosis present

## 2021-11-10 DIAGNOSIS — R4781 Slurred speech: Secondary | ICD-10-CM | POA: Diagnosis present

## 2021-11-10 DIAGNOSIS — I482 Chronic atrial fibrillation, unspecified: Secondary | ICD-10-CM | POA: Diagnosis present

## 2021-11-10 DIAGNOSIS — Z20822 Contact with and (suspected) exposure to covid-19: Secondary | ICD-10-CM | POA: Diagnosis present

## 2021-11-10 DIAGNOSIS — I6523 Occlusion and stenosis of bilateral carotid arteries: Secondary | ICD-10-CM | POA: Diagnosis not present

## 2021-11-10 DIAGNOSIS — I480 Paroxysmal atrial fibrillation: Secondary | ICD-10-CM | POA: Diagnosis not present

## 2021-11-10 DIAGNOSIS — S72142A Displaced intertrochanteric fracture of left femur, initial encounter for closed fracture: Secondary | ICD-10-CM | POA: Diagnosis present

## 2021-11-10 DIAGNOSIS — R400 Somnolence: Secondary | ICD-10-CM | POA: Diagnosis not present

## 2021-11-10 DIAGNOSIS — Z818 Family history of other mental and behavioral disorders: Secondary | ICD-10-CM

## 2021-11-10 DIAGNOSIS — R410 Disorientation, unspecified: Secondary | ICD-10-CM | POA: Diagnosis not present

## 2021-11-10 DIAGNOSIS — R609 Edema, unspecified: Secondary | ICD-10-CM | POA: Diagnosis not present

## 2021-11-10 DIAGNOSIS — R4182 Altered mental status, unspecified: Secondary | ICD-10-CM | POA: Diagnosis not present

## 2021-11-10 DIAGNOSIS — G319 Degenerative disease of nervous system, unspecified: Secondary | ICD-10-CM | POA: Diagnosis not present

## 2021-11-10 DIAGNOSIS — I959 Hypotension, unspecified: Secondary | ICD-10-CM | POA: Diagnosis not present

## 2021-11-10 DIAGNOSIS — R0689 Other abnormalities of breathing: Secondary | ICD-10-CM | POA: Diagnosis not present

## 2021-11-10 DIAGNOSIS — R55 Syncope and collapse: Secondary | ICD-10-CM | POA: Diagnosis not present

## 2021-11-10 DIAGNOSIS — W19XXXD Unspecified fall, subsequent encounter: Secondary | ICD-10-CM | POA: Diagnosis not present

## 2021-11-10 DIAGNOSIS — I499 Cardiac arrhythmia, unspecified: Secondary | ICD-10-CM | POA: Diagnosis not present

## 2021-11-10 DIAGNOSIS — D62 Acute posthemorrhagic anemia: Secondary | ICD-10-CM | POA: Diagnosis not present

## 2021-11-10 LAB — COMPREHENSIVE METABOLIC PANEL
ALT: 30 U/L (ref 0–44)
AST: 35 U/L (ref 15–41)
Albumin: 3.6 g/dL (ref 3.5–5.0)
Alkaline Phosphatase: 226 U/L — ABNORMAL HIGH (ref 38–126)
Anion gap: 10 (ref 5–15)
BUN: 17 mg/dL (ref 8–23)
CO2: 24 mmol/L (ref 22–32)
Calcium: 9.2 mg/dL (ref 8.9–10.3)
Chloride: 106 mmol/L (ref 98–111)
Creatinine, Ser: 0.94 mg/dL (ref 0.61–1.24)
GFR, Estimated: >60 mL/min (ref >60)
Glucose, Bld: 123 mg/dL — ABNORMAL HIGH (ref 70–99)
Potassium: 4.4 mmol/L (ref 3.5–5.1)
Sodium: 140 mmol/L (ref 135–145)
Total Bilirubin: 1.9 mg/dL — ABNORMAL HIGH (ref 0.3–1.2)
Total Protein: 6.9 g/dL (ref 6.5–8.1)

## 2021-11-10 LAB — RESP PANEL BY RT-PCR (FLU A&B, COVID) ARPGX2
Influenza A by PCR: NEGATIVE
Influenza B by PCR: NEGATIVE
SARS Coronavirus 2 by RT PCR: NEGATIVE

## 2021-11-10 LAB — URINALYSIS, COMPLETE (UACMP) WITH MICROSCOPIC
Bacteria, UA: NONE SEEN
Bilirubin Urine: NEGATIVE
Glucose, UA: NEGATIVE mg/dL
Hgb urine dipstick: NEGATIVE
Ketones, ur: NEGATIVE mg/dL
Leukocytes,Ua: NEGATIVE
Nitrite: NEGATIVE
Protein, ur: NEGATIVE mg/dL
Specific Gravity, Urine: 1.015 (ref 1.005–1.030)
pH: 5.5 (ref 5.0–8.0)

## 2021-11-10 LAB — CBC
HCT: 38.8 % — ABNORMAL LOW (ref 39.0–52.0)
Hemoglobin: 12.6 g/dL — ABNORMAL LOW (ref 13.0–17.0)
MCH: 30.9 pg (ref 26.0–34.0)
MCHC: 32.5 g/dL (ref 30.0–36.0)
MCV: 95.1 fL (ref 80.0–100.0)
Platelets: 249 10*3/uL (ref 150–400)
RBC: 4.08 MIL/uL — ABNORMAL LOW (ref 4.22–5.81)
RDW: 16.2 % — ABNORMAL HIGH (ref 11.5–15.5)
WBC: 8.4 10*3/uL (ref 4.0–10.5)
nRBC: 0 % (ref 0.0–0.2)

## 2021-11-10 LAB — TROPONIN I (HIGH SENSITIVITY)
Troponin I (High Sensitivity): 7 ng/L (ref <18)
Troponin I (High Sensitivity): 6 ng/L (ref <18)

## 2021-11-10 MED ORDER — ONDANSETRON HCL 4 MG/2ML IJ SOLN: 4.0000 mg | Freq: Four times a day (QID) | INTRAMUSCULAR | Status: DC | PRN

## 2021-11-10 MED ORDER — SODIUM CHLORIDE 0.9 % IV SOLN: INTRAVENOUS | Status: DC

## 2021-11-10 MED ORDER — FENTANYL CITRATE PF 50 MCG/ML IJ SOSY
50.0000 ug | PREFILLED_SYRINGE | Freq: Once | INTRAMUSCULAR | Status: AC
  Administered 2021-11-10: 50 ug via INTRAVENOUS
  Filled 2021-11-10: qty 1

## 2021-11-10 MED ORDER — ACETAMINOPHEN 650 MG RE SUPP: 650.0000 mg | Freq: Four times a day (QID) | RECTAL | Status: DC | PRN

## 2021-11-10 MED ORDER — ONDANSETRON HCL 4 MG PO TABS: 4.0000 mg | ORAL_TABLET | Freq: Four times a day (QID) | ORAL | Status: DC | PRN

## 2021-11-10 MED ORDER — ENOXAPARIN SODIUM 40 MG/0.4ML IJ SOSY
40.0000 mg | PREFILLED_SYRINGE | INTRAMUSCULAR | Status: DC
  Administered 2021-11-11 – 2021-11-16 (×6): 40 mg via SUBCUTANEOUS
  Filled 2021-11-10 (×6): qty 0.4

## 2021-11-10 MED ORDER — ALBUTEROL SULFATE HFA 108 (90 BASE) MCG/ACT IN AERS: 1.0000 | INHALATION_SPRAY | RESPIRATORY_TRACT | Status: DC | PRN

## 2021-11-10 MED ORDER — SERTRALINE HCL 50 MG PO TABS
25.0000 mg | ORAL_TABLET | Freq: Every day | ORAL | Status: DC
  Administered 2021-11-11 – 2021-11-16 (×6): 25 mg via ORAL
  Filled 2021-11-10 (×6): qty 1

## 2021-11-10 MED ORDER — ASPIRIN 81 MG PO TBEC
81.0000 mg | DELAYED_RELEASE_TABLET | Freq: Every day | ORAL | Status: DC
  Administered 2021-11-11 – 2021-11-16 (×6): 81 mg via ORAL
  Filled 2021-11-10 (×6): qty 1

## 2021-11-10 MED ORDER — SODIUM CHLORIDE 0.9 % IV BOLUS
1000.0000 mL | Freq: Once | INTRAVENOUS | Status: AC
  Administered 2021-11-10: 1000 mL via INTRAVENOUS

## 2021-11-10 MED ORDER — METHOCARBAMOL 500 MG PO TABS
500.0000 mg | ORAL_TABLET | Freq: Four times a day (QID) | ORAL | Status: DC | PRN
  Administered 2021-11-12 – 2021-11-15 (×5): 500 mg via ORAL
  Filled 2021-11-10 (×5): qty 1

## 2021-11-10 MED ORDER — MOMETASONE FURO-FORMOTEROL FUM 200-5 MCG/ACT IN AERO
2.0000 | INHALATION_SPRAY | Freq: Two times a day (BID) | RESPIRATORY_TRACT | Status: DC
  Administered 2021-11-11 – 2021-11-16 (×11): 2 via RESPIRATORY_TRACT
  Filled 2021-11-10 (×2): qty 8.8

## 2021-11-10 MED ORDER — MIDODRINE HCL 5 MG PO TABS
10.0000 mg | ORAL_TABLET | Freq: Three times a day (TID) | ORAL | Status: DC
  Administered 2021-11-11 – 2021-11-16 (×17): 10 mg via ORAL
  Filled 2021-11-10 (×17): qty 2

## 2021-11-10 MED ORDER — TRAMADOL HCL 50 MG PO TABS
50.0000 mg | ORAL_TABLET | Freq: Four times a day (QID) | ORAL | Status: DC | PRN
  Administered 2021-11-11 – 2021-11-15 (×6): 50 mg via ORAL
  Filled 2021-11-10 (×7): qty 1

## 2021-11-10 MED ORDER — ROSUVASTATIN CALCIUM 10 MG PO TABS
10.0000 mg | ORAL_TABLET | Freq: Every day | ORAL | Status: DC
  Administered 2021-11-11 – 2021-11-16 (×6): 10 mg via ORAL
  Filled 2021-11-10 (×6): qty 1

## 2021-11-10 MED ORDER — PANTOPRAZOLE SODIUM 40 MG PO TBEC
40.0000 mg | DELAYED_RELEASE_TABLET | Freq: Two times a day (BID) | ORAL | Status: DC
  Administered 2021-11-11 – 2021-11-16 (×11): 40 mg via ORAL
  Filled 2021-11-10 (×11): qty 1

## 2021-11-10 MED ORDER — FERROUS SULFATE 325 (65 FE) MG PO TABS
325.0000 mg | ORAL_TABLET | Freq: Every day | ORAL | Status: DC
  Administered 2021-11-11 – 2021-11-16 (×6): 325 mg via ORAL
  Filled 2021-11-10 (×6): qty 1

## 2021-11-10 MED ORDER — METOPROLOL SUCCINATE ER 25 MG PO TB24
37.5000 mg | ORAL_TABLET | Freq: Every day | ORAL | Status: DC
  Administered 2021-11-11 – 2021-11-13 (×3): 37.5 mg via ORAL
  Filled 2021-11-10 (×3): qty 2

## 2021-11-10 MED ORDER — TRAZODONE HCL 50 MG PO TABS: 25.0000 mg | ORAL_TABLET | Freq: Every evening | ORAL | Status: DC | PRN

## 2021-11-10 MED ORDER — BISACODYL 10 MG RE SUPP: 10.0000 mg | Freq: Every day | RECTAL | Status: DC | PRN

## 2021-11-10 MED ORDER — ACETAMINOPHEN 325 MG PO TABS: 650.0000 mg | ORAL_TABLET | Freq: Four times a day (QID) | ORAL | Status: DC | PRN

## 2021-11-10 MED ORDER — SENNA 8.6 MG PO TABS
1.0000 | ORAL_TABLET | Freq: Every day | ORAL | Status: DC
  Administered 2021-11-11 – 2021-11-16 (×6): 8.6 mg via ORAL
  Filled 2021-11-10 (×6): qty 1

## 2021-11-10 MED ORDER — MAGNESIUM HYDROXIDE 400 MG/5ML PO SUSP: 30.0000 mL | Freq: Every day | ORAL | Status: DC | PRN

## 2021-11-10 MED ORDER — FUROSEMIDE 40 MG PO TABS: 40.0000 mg | ORAL_TABLET | Freq: Every day | ORAL | Status: DC | PRN

## 2021-11-10 MED ORDER — SIMETHICONE 80 MG PO CHEW: 80.0000 mg | CHEWABLE_TABLET | Freq: Four times a day (QID) | ORAL | Status: DC | PRN

## 2021-11-10 MED ORDER — VITAMIN B-12 1000 MCG PO TABS
1000.0000 ug | ORAL_TABLET | Freq: Every day | ORAL | Status: DC
  Administered 2021-11-11 – 2021-11-16 (×6): 1000 ug via ORAL
  Filled 2021-11-10 (×6): qty 1

## 2021-11-10 MED ORDER — DOCUSATE SODIUM 100 MG PO CAPS
100.0000 mg | ORAL_CAPSULE | Freq: Two times a day (BID) | ORAL | Status: DC | PRN
  Administered 2021-11-15: 100 mg via ORAL
  Filled 2021-11-10: qty 1

## 2021-11-10 NOTE — ED Provider Notes (Signed)
[facility] Provider Note    Event Date/Time   First MD Initiated Contact with Patient 11/10/21 1517     (approximate)  History   Chief Complaint: Near Syncope (/femur fx some time ago was in [employer] [facility]. Staff called ems for possible unwittnessed syncopal event. )  HPI  Jeremy Herrera is a 81 y.o. male with a past medical history of CHF, diabetes, gastric reflux, hypertension, hyperlipidemia, presents emergency department for weakness and unresponsiveness.  According to report and the patient's son who is here with him the patient is currently at a rehab facility following a hip surgery due to a hip fracture.  He is only been there several days, today he was seeming very weak and fatigued the nursing home staff found the patient to be unresponsive so they sent the patient to the emergency department.  Here the patient is awake and alert he is able to answer questions but is very slow to respond and seems quite fatigued.  Dry mouth.  Given his general weakness and fatigue unable to assess for focal weakness with further exam.  Physical Exam   Triage Vital Signs: ED Triage Vitals  Enc Vitals Group     BP 11/10/21 1517 (!) 137/101     Pulse Rate 11/10/21 1517 (!) 112     Resp --      Temp 11/10/21 1517 (!) 97.4 F (36.3 C)     Temp Source 11/10/21 1517 Oral     SpO2 11/10/21 1511 99 %     Weight 11/10/21 1519 217 lb 6 oz (98.6 kg)     Height 11/10/21 1519 6\' 1"  (1.854 m)     Head Circumference --      Peak Flow --      Pain Score --      Pain Loc --      Pain Edu? --      Excl. in GC? --     Most recent vital signs: Vitals:   11/10/21 1511 11/10/21 1517  BP:  (!) 137/101  Pulse:  (!) 112  Temp:  (!) 97.4 F (36.3 C)  SpO2: 99% 99%    General: Patient is awake, but is quite fatigued, falls asleep when not engaged, will awaken to voice answers questions very sluggishly attempts to follow basic commands was very slow and sluggish as  well.  Dry mucous membranes. CV:  Good peripheral perfusion.  Rhythm appears somewhat irregular around 120 bpm. Resp:  Normal effort.  Equal breath sounds bilaterally.  No obvious wheeze or rales. Abd:  No distention.  Soft, nontender.  No rebound or guarding. Other:  Minimal lower extremity edema laterally.  Nontender.   ED Results / Procedures / Treatments   EKG  EKG viewed and interpreted by myself shows atrial fibrillation with rapid ventricular response at 104 bpm with a narrow QRS, normal axis, largely normal intervals with nonspecific ST changes.  RADIOLOGY  I personally reviewed and interpreted the CT finding of the head I do not see any large bleed on my evaluation. Radiology is read the CT scan as age-indeterminate small vessel changes but no other acute infarct or hemorrhage.   MEDICATIONS ORDERED IN ED: Medications  sodium chloride 0.9 % bolus 1,000 mL (has no administration in time range)     IMPRESSION / MDM / ASSESSMENT AND PLAN / ED COURSE  I reviewed the triage vital signs and the nursing notes.  Patient's presentation is most consistent with acute  presentation with potential threat to life or bodily function.  Patient presents emergency department for unresponsiveness sent from his nursing facility.  Viewed the patient's discharge summary from 11/04/2021 after an admission for a left intertrochanteric fracture status post surgery, also found to have CHF exacerbation COPD and chronic atrial fibrillation.  Given the patient's fatigue state we will check labs, urine obtain CT imaging of the head, chest x-ray.  Differential is quite broad but would include metabolic or electrolyte abnormality, dehydration, infectious etiology shows urinary tract infection pneumonia or COVID, CVA/ICH.  Patient's work-up shows overall reassuring results.  CBC shows no concerning findings, troponin is negative, chemistry shows normal renal function with otherwise normal results.  Urinalysis  shows no sign of infection.  COVID and flu test are negative.  Patient's chest x-ray shows improving from prior.  CT scan head does show age-indeterminate small vessel disease.  Given the patient's increased weakness and episode of unresponsiveness I have ordered an MRI of the brain.  Regardless patient continues to feel weak and does not believe he will be able to go back to his facility given the patient's unresponsive episode and continued weakness I believe an admission for close monitoring and further work-up is warranted.  We will admit to the hospital.  FINAL CLINICAL IMPRESSION(S) / ED DIAGNOSES   Weakness Unresponsiveness    Note:  This document was prepared using Dragon voice recognition software and may include unintentional dictation errors.   Harvest Dark, MD 11/10/21 [phone]

## 2021-11-10 NOTE — H&P (Addendum)
[place]   PATIENT NAME: Jeremy Herrera    MR#:  [id number]  DATE OF BIRTH:  11/18/1940  DATE OF ADMISSION:  11/10/2021  PRIMARY CARE PHYSICIAN: Sofie Hartigan, MD   Patient is coming from: SNF.  REQUESTING/REFERRING PHYSICIAN:   Harvest Dark, MD  CHIEF COMPLAINT:   Chief Complaint  Patient presents with   Near Syncope     femur fx some time ago was in [facility] for rehab. Staff called ems for possible unwittnessed syncopal event.     HISTORY OF PRESENT ILLNESS:  Jeremy Herrera is a 81 y.o. Caucasian male with medical history significant for CHF, type 2 diabetes mellitus, GERD, hypertension, dyslipidemia and BPH, who presented to the emergency room with acute onset of unresponsiveness.  The patient was coming back from physical therapy and they brought lunch to him.  He felt so tired and weak all over while sitting and became unresponsive.  He is in rehab after recent hip surgery for fracture.  He denies any chest pain or palpitations.  No headache or dizziness or blurred vision or paresthesias or focal muscle weakness.  He was having slurred speech though and denies any expressive dysphasia or dysphagia.  No new urinary or stool incontinence.  No nausea or vomiting or abdominal pain.  No dysuria, oliguria or hematuria or flank pain.  He is having dry mouth.  The patient's most recent 2D echo was on 10/27/2021 and revealed an EF of 60 to 65% with indeterminate diastolic parameters. ED Course: Medical to the ER, BP was 137/101 with a temperature of 97.4 and heart rate 112 with respiratory to 25.  Labs revealed an alk phos of 226, total bili 1.9 with otherwise unremarkable CMP.  I sensitive troponin was 7 and later 6.  CBC showed mild anemia.  Urinalysis was negative.   EKG as reviewed by me : EKG showed atrial fibrillation with slightly rapid ventricular sponsor 104. Imaging: Portable chest x-ray showed cardiomegaly with partial clearing of interstitial  infiltrates suggesting resolving CHF or multifocal pneumonia that is resolving.  He had no new infiltrates.  Noncontrasted CT scan revealed age-indeterminate small vessel ischemic changes within the periventricular white matter in the right basal ganglia with otherwise no acute infarct or hemorrhage.  The patient was given 50 mcg of IV fentanyl and 1 L bolus of IV and normal saline.  He will be admitted to an observation medical telemetry bed for further evaluation and management PAST MEDICAL HISTORY:   Past Medical History:  Diagnosis Date   Anemia    BPH (benign prostatic hyperplasia)    CHF (congestive heart failure) ([place])    Diabetes mellitus without complication (HCC)    Dysphagia    GERD (gastroesophageal reflux disease)    HLD (hyperlipidemia)    HTN (hypertension)    Irregular heart beat    Nocturia    Urinary retention     PAST SURGICAL HISTORY:   Past Surgical History:  Procedure Laterality Date   AMPUTATION TOE Left 05/04/2017   Procedure: AMPUTATION TOE - third left toe;  Surgeon: Samara Deist, DPM;  Location: [facility] ORS;  Service: Podiatry;  Laterality: Left;   bladder patching  1964   HIP SURGERY Right 2014   INTRAMEDULLARY (IM) NAIL INTERTROCHANTERIC Left 10/25/2021   Procedure: INTRAMEDULLARY (IM) NAIL INTERTROCHANTERIC;  Surgeon: Leim Fabry, MD;  Location: [facility] ORS;  Service: Orthopedics;  Laterality: Left;   IRRIGATION AND DEBRIDEMENT FOOT Left 11/14/2016   Procedure: IRRIGATION AND  DEBRIDEMENT FOOT;  Surgeon: Samara Deist, DPM;  Location: [facility] ORS;  Service: Podiatry;  Laterality: Left;   TRANSURETHRAL RESECTION OF PROSTATE      SOCIAL HISTORY:   Social History   Tobacco Use   Smoking status: Never   Smokeless tobacco: Never  Substance Use Topics   Alcohol use: No    FAMILY HISTORY:   Family History  Problem Relation Age of Onset   Ulcers Mother        stomach   GER disease Sister        GERD   Heart attack Father    Diabetes Brother     Depression Brother    Kidney disease Neg Hx    Prostate cancer Neg Hx    Kidney cancer Neg Hx    Bladder Cancer Neg Hx     DRUG ALLERGIES:   Allergies  Allergen Reactions   Hydrocodone-Acetaminophen Nausea And Vomiting    REVIEW OF SYSTEMS:   ROS As per history of present illness. All pertinent systems were reviewed above. Constitutional, HEENT, cardiovascular, respiratory, GI, GU, musculoskeletal, neuro, psychiatric, endocrine, integumentary and hematologic systems were reviewed and are otherwise negative/unremarkable except for positive findings mentioned above in the HPI.   MEDICATIONS AT HOME:   Prior to Admission medications   Medication Sig Start Date End Date Taking? Authorizing Provider  aspirin 81 MG tablet Take 81 mg by mouth daily.   Yes [provider]  ferrous sulfate 325 (65 FE) MG tablet Take 325 mg by mouth daily.   Yes [provider]  Fluticasone-Salmeterol (ADVAIR) 250-50 MCG/DOSE AEPB Inhale 1 puff into the lungs in the morning and at bedtime.   Yes [provider]  methocarbamol (ROBAXIN) 500 MG tablet Take 1 tablet (500 mg total) by mouth every 6 (six) hours as needed for muscle spasms. 10/28/21  Yes Reche Dixon, PA-C  metoprolol succinate (TOPROL-XL) 25 MG 24 hr tablet Take 1.5 tablets (37.5 mg total) by mouth daily. 11/04/21  Yes Amin, Ankit Chirag, MD  midodrine (PROAMATINE) 10 MG tablet Take 1 tablet (10 mg total) by mouth 3 (three) times daily with meals. 11/04/21  Yes Amin, Jeanella Flattery, MD  oxyCODONE (OXY IR/ROXICODONE) 5 MG immediate release tablet Take 0.5-1 tablets (2.5-5 mg total) by mouth every 4 (four) hours as needed for moderate pain (pain score 4-6). 10/28/21  Yes Reche Dixon, PA-C  pantoprazole (PROTONIX) 40 MG tablet Take 1 tablet (40 mg total) by mouth 2 (two) times daily before a meal. 11/04/21  Yes Amin, Ankit Chirag, MD  rosuvastatin (CRESTOR) 10 MG tablet Take 1 tablet by mouth daily. 07/27/20 11/10/21 Yes [provider]  senna (SENOKOT) 8.6 MG TABS tablet Take 1 tablet (8.6 mg total) by mouth daily. 11/04/21  Yes Amin, Ankit Chirag, MD  sertraline (ZOLOFT) 25 MG tablet Take 25 mg by mouth daily.  08/31/18  Yes [provider]  traMADol (ULTRAM) 50 MG tablet Take 1 tablet (50 mg total) by mouth every 6 (six) hours as needed for moderate pain. 10/28/21  Yes Reche Dixon, PA-C  umeclidinium-vilanterol (ANORO ELLIPTA) 62.5-25 MCG/INH AEPB Inhale 1 puff into the lungs daily as needed. Takes daily 08/05/18  Yes [provider]  vitamin B-12 (CYANOCOBALAMIN) 1000 MCG tablet Take 1 tablet (1,000 mcg total) by mouth daily. 08/19/20  Yes Earlie Server, MD  albuterol (VENTOLIN HFA) 108 (90 Base) MCG/ACT inhaler Inhale 1-2 puffs into the lungs every 4 (four) hours as needed.  07/07/18   [provider]  bisacodyl (DULCOLAX) 10 MG suppository Place 1 suppository (10 mg total) rectally daily as needed for moderate constipation. 11/04/21   Amin, Jeanella Flattery, MD  docusate sodium (COLACE) 100 MG capsule Take 1 capsule (100 mg total) by mouth 2 (two) times daily as needed for mild constipation. 11/04/21   Amin, Jeanella Flattery, MD  enoxaparin (LOVENOX) 40 MG/0.4ML injection Inject 0.4 mLs (40 mg total) into the skin at bedtime for 14 days. 11/04/21 11/18/21  Amin, Jeanella Flattery, MD  furosemide (LASIX) 40 MG tablet Take 1 tablet (40 mg total) by mouth daily as needed for fluid or edema. 11/04/21   Amin, Jeanella Flattery, MD  ondansetron (ZOFRAN) 4 MG tablet Take 1 tablet (4 mg total) by mouth every 6 (six) hours as needed for nausea. 10/28/21   Reche Dixon, PA-C  simethicone (MYLICON) 170 MG chewable tablet Chew 125 mg by mouth every 6 (six) hours as needed.     [provider]  Skin Protectants, Misc. (REMEDY PHYTOPLEX HYDRAGUARD) CREA Apply topically.    [provider]      VITAL SIGNS:  Blood pressure (!) 135/93, pulse (!) 105, temperature 97.8 F (36.6 C), temperature source Oral, resp. rate  (!) 25, height _0  (1.854 m), weight 98.6 kg, SpO2 97 %.  PHYSICAL EXAMINATION:  Physical Exam  GENERAL:  81 y.o.-year-old male patient lying in the bed with no acute distress.  EYES: Pupils equal, round, reactive to light and accommodation. No scleral icterus. Extraocular muscles intact.  HEENT: Head atraumatic, normocephalic. Oropharynx with mucous membrane and tongue and nasopharynx clear.  NECK:  Supple, no jugular venous distention. No thyroid enlargement, no tenderness.  LUNGS: Normal breath sounds bilaterally, no wheezing, rales,rhonchi or crepitation. No use of accessory muscles of respiration.  CARDIOVASCULAR: Irregularly irregular rhythm, S1, S2 normal. No murmurs, rubs, or gallops.  ABDOMEN: Soft, nondistended, nontender. Bowel sounds present. No organomegaly or mass.  EXTREMITIES: No pedal edema, cyanosis, or clubbing.  NEUROLOGIC: Cranial nerves II through XII are intact. Muscle strength 5/5 in all extremities. Sensation intact. Gait not checked.  PSYCHIATRIC: The patient is alert and oriented x 3.  Normal affect and good eye contact. SKIN: No obvious rash, lesion, or ulcer.   LABORATORY PANEL:   CBC Recent Labs  Lab 11/10/21 1534  WBC 8.4  HGB 12.6*  HCT 38.8*  PLT 249   ------------------------------------------------------------------------------------------------------------------  Chemistries  Recent Labs  Lab 11/04/21 0312 11/10/21 1534  NA 142 140  K 4.1 4.4  CL 112* 106  CO2 23 24  GLUCOSE 183* 123*  BUN 31* 17  CREATININE 1.03 0.94  CALCIUM 8.4* 9.2  MG 2.2  --   AST  --  35  ALT  --  30  ALKPHOS  --  226*  BILITOT  --  1.9*   ------------------------------------------------------------------------------------------------------------------  Cardiac Enzymes No results for input(s): "TROPONINI" in the last 168  hours. ------------------------------------------------------------------------------------------------------------------  RADIOLOGY:  MR BRAIN WO CONTRAST  Result Date: 11/10/2021 CLINICAL DATA:  Follow-up examination for stroke. EXAM: MRI HEAD WITHOUT CONTRAST TECHNIQUE: Multiplanar, multiecho pulse sequences of the brain and surrounding structures were obtained without intravenous contrast. COMPARISON:  Prior CT from earlier the same day. FINDINGS: Brain: Mild age-related cerebral atrophy. Patchy T2/FLAIR hyperintensity involving the periventricular deep white matter both cerebral hemispheres as well as the pons, most consistent with chronic small vessel ischemic disease, mild to moderate in nature. No evidence for acute or subacute ischemia. Gray-white matter differentiation maintained. No areas of chronic cortical infarction. No acute  intracranial hemorrhage. Single punctate chronic microhemorrhage noted within the left frontal lobe, of doubtful significance in isolation. No mass lesion, midline shift or mass effect. No hydrocephalus or extra-axial fluid collection. Pituitary gland suprasellar region within normal limits. Vascular: Major intracranial vascular flow voids are maintained. Skull and upper cervical spine: Craniocervical junction within normal limits. Bone marrow signal intensity normal. No scalp soft tissue abnormality. Sinuses/Orbits: Prior bilateral ocular lens replacement. Paranasal sinuses are largely clear. No significant mastoid effusion. Other: None. IMPRESSION: 1. No acute intracranial abnormality. 2. Age-related cerebral atrophy with mild to moderate chronic small vessel ischemic disease. Electronically Signed   By: Jeannine Boga M.D.   On: 11/10/2021 21:37   DG Chest Portable 1 View  Result Date: 11/10/2021 CLINICAL DATA:  Altered mental status, syncope EXAM: PORTABLE CHEST 1 VIEW COMPARISON:  10/28/2021 FINDINGS: Transverse diameter of heart is increased. There is  interval decrease in interstitial markings in both lungs. There is residual prominence of interstitial markings, more so on the left side. No new focal infiltrates are seen. There is no significant pleural effusion or pneumothorax. Right hemidiaphragm is elevated. There is interposition of colon between liver and diaphragm. IMPRESSION: Cardiomegaly. There is partial clearing of interstitial infiltrates suggesting resolving CHF or resolving multifocal pneumonia. No new focal infiltrates are seen. Electronically Signed   By: Elmer Picker M.D.   On: 11/10/2021 16:36   CT HEAD WO CONTRAST (5MM)  Result Date: 11/10/2021 CLINICAL DATA:  Altered level of consciousness, confusion EXAM: CT HEAD WITHOUT CONTRAST TECHNIQUE: Contiguous axial images were obtained from the base of the skull through the vertex without intravenous contrast. RADIATION DOSE REDUCTION: This exam was performed according to the departmental dose-optimization program which includes automated exposure control, adjustment of the mA and/or kV according to patient size and/or use of iterative reconstruction technique. COMPARISON:  03/31/2018 FINDINGS: Brain: Scattered hypodensities within the periventricular white matter and within the right basal ganglia are compatible with age-indeterminate small vessel ischemic changes. No other signs of acute infarct or hemorrhage. The lateral ventricles and midline structures are otherwise unremarkable. No acute extra-axial fluid collections. No mass effect. Vascular: Prominent atherosclerosis of the internal carotid arteries. No hyperdense vessel. Skull: Normal. Negative for fracture or focal lesion. Sinuses/Orbits: No acute finding. Other: None. IMPRESSION: 1. Age-indeterminate small vessel ischemic changes within the periventricular white matter and right basal ganglia. 2. Otherwise no acute infarct or hemorrhage. Electronically Signed   By: Randa Ngo M.D.   On: 11/10/2021 15:51      IMPRESSION  AND PLAN:  Assessment and Plan: * Unresponsiveness - The patient will be admitted to an observation medical telemetry bed. - Given slurred speech, will follow neurochecks every 4 hours for 24 hours. - Brain MRI without contrast will be obtained. - 2D echo with bubble study as well as bilateral carotid Doppler will be obtained. - PT/OT and ST consults will be obtained. - We will place him on aspirin as well as statin therapy. - Fasting lipids will be obtained. - We will monitor for arrhythmias for potential syncope event. - We will check his orthostatics. - He will be monitored for hypoglycemia.  Atrial fibrillation with rapid ventricular response (HCC) - We will continue Toprol-XL. - Apparently patient was not a candidate for anticoagulation especially after his fall and left hip fracture.  GERD without esophagitis - We will continue PPI therapy.  Dyslipidemia - We will continue statin therapy.  Depression - We will continue Zoloft.   DVT prophylaxis: Lovenox.  Advanced Care  Planning:  Code Status: He is/DNI.  This was discussed with him. Family Communication:  The plan of care was discussed in details with the patient (and family). I answered all questions. The patient agreed to proceed with the above mentioned plan. Further management will depend upon hospital course. Disposition Plan: Back to previous home environment Consults called: none.  All the records are reviewed and case discussed with ED provider.  Status is: Observation  I certify that at the time of admission, it is my clinical judgment that the patient will require hospital care extending less than 2 midnights.                            Dispo: The patient is from: SN F              Anticipated d/c is to: + Left              Patient currently is not medically stable to d/c.              Difficult to place patient: No  Christel Mormon M.D on 11/10/2021 at 10:23 PM  [facility]   From 7 PM-7 AM, contact  night-coverage [email or web address]  CC: Primary care physician; Sofie Hartigan, MD

## 2021-11-10 NOTE — ED Notes (Signed)
Patient transported to MRI 

## 2021-11-10 NOTE — ED Triage Notes (Signed)
  Femur fx x2wks ago. Was in [facility] for rehab. Staff called ems for possible unwittnessed syncopal event. Son states that he saw him this AM at rehab and Pt was tired but speaking coherently. Pt now extremely difficult to understand with garbled speech. Vitals per EMS. EMS were BP150/90 Temp 97.5 Hr 105  Cardiac Rythym AFIB Glucose 170 SPO2 100  RR25

## 2021-11-10 NOTE — Assessment & Plan Note (Addendum)
Continue Zoloft 

## 2021-11-10 NOTE — Assessment & Plan Note (Addendum)
BP stable but soft at times.   HR"s varying from low 100's to 130's. --Continue home Toprol-XL 37.5 mg  --IV metoprolol 5 mg 14h PRN for HR>110 - Cardiology consulted - recommend IV hydration and continue metoprolol as is with no other changes or evaluation recommended -- Start Cardizem or amiodarone drip if HR refractory to above, depending on BP --Monitor BP closely --Telemetry

## 2021-11-10 NOTE — Assessment & Plan Note (Addendum)
Continue PPI ?

## 2021-11-10 NOTE — Assessment & Plan Note (Addendum)
Continue statin. 

## 2021-11-10 NOTE — Assessment & Plan Note (Addendum)
Stroke ruled out.  Suspect transient hypotension in the setting of A-fib with RVR.  MRI brain showed no stroke. Unremarkable echo and carotid duplex U/S.   -Neurochecks- Brain MRI without contrast will be obtained. - Follow up PT/OT/SLP recommendations -Started on aspirin statin on admission. -- Telemetry - Check orthostatic vitals - Monitor for hypoglycemia

## 2021-11-11 ENCOUNTER — Inpatient Hospital Stay: Payer: Medicare PPO

## 2021-11-11 ENCOUNTER — Inpatient Hospital Stay
Admit: 2021-11-11 | Discharge: 2021-11-11 | Disposition: A | Discharge status: Home / Self Care | Payer: Medicare PPO | Attending: Family Medicine | Admitting: Family Medicine

## 2021-11-11 DIAGNOSIS — I951 Orthostatic hypotension: Secondary | ICD-10-CM | POA: Diagnosis present

## 2021-11-11 DIAGNOSIS — R4189 Other symptoms and signs involving cognitive functions and awareness: Secondary | ICD-10-CM | POA: Diagnosis not present

## 2021-11-11 LAB — BASIC METABOLIC PANEL
Anion gap: 8 (ref 5–15)
BUN: 13 mg/dL (ref 8–23)
CO2: 25 mmol/L (ref 22–32)
Calcium: 8.7 mg/dL — ABNORMAL LOW (ref 8.9–10.3)
Chloride: 106 mmol/L (ref 98–111)
Creatinine, Ser: 0.95 mg/dL (ref 0.61–1.24)
GFR, Estimated: >60 mL/min (ref >60)
Glucose, Bld: 103 mg/dL — ABNORMAL HIGH (ref 70–99)
Potassium: 3.9 mmol/L (ref 3.5–5.1)
Sodium: 139 mmol/L (ref 135–145)

## 2021-11-11 LAB — LIPID PANEL
Cholesterol: 152 mg/dL (ref 0–200)
HDL: 34 mg/dL — ABNORMAL LOW (ref >40)
LDL Cholesterol: 99 mg/dL (ref 0–99)
Total CHOL/HDL Ratio: 4.5 RATIO
Triglycerides: 94 mg/dL (ref <150)
VLDL: 19 mg/dL (ref 0–40)

## 2021-11-11 LAB — CBC
HCT: 35.7 % — ABNORMAL LOW (ref 39.0–52.0)
Hemoglobin: 11.5 g/dL — ABNORMAL LOW (ref 13.0–17.0)
MCH: 30.5 pg (ref 26.0–34.0)
MCHC: 32.2 g/dL (ref 30.0–36.0)
MCV: 94.7 fL (ref 80.0–100.0)
Platelets: 241 10*3/uL (ref 150–400)
RBC: 3.77 MIL/uL — ABNORMAL LOW (ref 4.22–5.81)
RDW: 16.5 % — ABNORMAL HIGH (ref 11.5–15.5)
WBC: 5.7 10*3/uL (ref 4.0–10.5)
nRBC: 0 % (ref 0.0–0.2)

## 2021-11-11 LAB — ECHOCARDIOGRAM COMPLETE BUBBLE STUDY
AR max vel: 1.62 cm2
AV Area VTI: 2.15 cm2
AV Area mean vel: 1.78 cm2
AV Mean grad: 6 mmHg
AV Peak grad: 11.1 mmHg
Ao pk vel: 1.66 m/s
Area-P 1/2: 4.29 cm2
S' Lateral: 3.2 cm

## 2021-11-11 MED ORDER — FUROSEMIDE 10 MG/ML IJ SOLN
20.0000 mg | Freq: Once | INTRAMUSCULAR | Status: AC
  Administered 2021-11-11: 20 mg via INTRAVENOUS
  Filled 2021-11-11: qty 4

## 2021-11-11 MED ORDER — METOPROLOL TARTRATE 5 MG/5ML IV SOLN
5.0000 mg | INTRAVENOUS | Status: DC | PRN
  Administered 2021-11-15: 5 mg via INTRAVENOUS
  Filled 2021-11-11: qty 5

## 2021-11-11 NOTE — Evaluation (Signed)
Physical Therapy Evaluation Patient Details Name: Jeremy Herrera MRN: [id number] DOB: [birth year] Today's Date: 11/11/2021  History of Present Illness  Jeremy Herrera is an 26yoM who comes to [facility] on 11/10/21 after syncope at facility, pt coming in from [facility] where pt was there for STR since 11/04/21 for rehab needs s/p IM fixation of Left hip fracture. While previously admitted pt had several days of labile BP and HR with transition from supine to not-supine. CHF, DM, GERD, HTN, HLD, falls.  Clinical Impression  Pt admitted with above Dx. Pt has functional limitations due to deficits below (see "PT Problem List"). Pt able to provide details on recent progression in functional status, just recently started taking a few steps at STR and recently able to perform supine to EOB without assist. Today pt requires minimal physical assistance to perform bed mobility and bed exercises, still limited by pain in operative leg and presyncopal prodrome in the setting of recurrent orthostatic hypotension. Due to prodrome at EOB, pt moved back to supine for recovery, 120/75 at first supine assessment. Pt assisted with pulling up in bed, then with pericare cleaning of bowel (called NSG for assistance). Pt is highly motivated to take part in any therapy to progress his return of function, partakes in some leg exercises despite high pain levels, some active assistance provided as a pain control measure. Pt still requires heavy assistance and is not ready for return to home, would benefit from continuing STR to optimize return to independence and function prior to return to home. Patient's performance this date reveals decreased ability, independence, and tolerance in performing all basic mobility required for performance of activities of daily living. Pt requires additional DME, close physical assistance, and cues for safe participate in mobility. Pt will benefit from skilled PT intervention to increase independence  and safety with basic mobility in preparation for discharge to the venue listed below.     Orthostatic Vital signs             Supine   Sitting- 0 min  Sitting- 1 min Standing- 0 min Standing- 1 min Standing- s/p AMB  BP (mmHg)  HR (BPM)  BP (mmHg)  HR (BPM)  BP (mmHg)  HR (BPM)  BP (mmHg)  HR (BPM)  BP (mmHg)  HR (BPM)  BP (mmHg)  HR (BPM)   115/74 AF 100/57 AF 84/63* AF not tested       *presyncopal prodrome      Recommendations for follow up therapy are one component of a multi-disciplinary discharge planning process, led by the attending physician.  Recommendations may be updated based on patient status, additional functional criteria and insurance authorization.  Follow Up Recommendations Skilled nursing-short term rehab (<3 hours/day) Can patient physically be transported by private vehicle: No    Assistance Recommended at Discharge Frequent or constant Supervision/Assistance  Patient can return home with the following  Two people to help with walking and/or transfers;Two people to help with bathing/dressing/bathroom;Help with stairs or ramp for entrance;Assist for transportation;A little help with bathing/dressing/bathroom    Equipment Recommendations None recommended by PT  Recommendations for Other Services       Functional Status Assessment Patient has had a recent decline in their functional status and demonstrates the ability to make significant improvements in function in a reasonable and predictable amount of time.     Precautions / Restrictions Precautions Precautions: Fall Restrictions LLE Weight Bearing: Weight bearing as tolerated (take from prior admission)  Mobility  Bed Mobility Overal bed mobility: Needs Assistance Bed Mobility: Supine to Sit, Sit to Supine     Supine to sit: Min assist, HOB elevated Sit to supine: Min assist, HOB elevated   General bed mobility comments: MaxA to pull sefl up in bed; orthostatic at first vitals, then moreso at 1  minute assessment with commencement of presyncope    Transfers                        Ambulation/Gait                  Stairs            Wheelchair Mobility    Modified Rankin (Stroke Patients Only)       Balance                                             Pertinent Vitals/Pain Pain Assessment Pain Assessment: Faces Faces Pain Scale: Hurts even more (during mobility and bed exercises) Pain Location: L LE Pain Descriptors / Indicators: Grimacing Pain Intervention(s): Limited activity within patient's tolerance, Monitored during session, Premedicated before session, Repositioned, Patient requesting pain meds-RN notified    Home Living Family/patient expects to be discharged to:: Skilled nursing facility Living Arrangements: Children Available Help at Discharge: Family;Available PRN/intermittently Type of Home: House Home Access: Stairs to enter Entrance Stairs-Rails: None Entrance Stairs-Number of Steps: 1   Home Layout: One level Home Equipment: Conservation officer, nature (2 wheels);Rollator (4 wheels);Cane - single point      Prior Function Prior Level of Function : Independent/Modified Independent             Mobility Comments: multiple falls ADLs Comments: relies on family for transportation. Reports remote falls history     Hand Dominance   Dominant Hand: Right    Extremity/Trunk Assessment   Upper Extremity Assessment Upper Extremity Assessment: Overall WFL for tasks assessed    Lower Extremity Assessment Lower Extremity Assessment: Generalized weakness       Communication   Communication: HOH  Cognition Arousal/Alertness: Awake/alert Behavior During Therapy: WFL for tasks assessed/performed Overall Cognitive Status: Within Functional Limits for tasks assessed                                          General Comments      Exercises Total Joint Exercises Short Arc Quad: AAROM, Left, 20  reps, Supine, Limitations Short Arc Quad Limitations: 2x10 General Exercises - Lower Extremity Short Arc Quad: Supine, Right, 15 reps, AROM   Assessment/Plan    PT Assessment Patient needs continued PT services  PT Problem List Decreased strength;Decreased mobility;Decreased range of motion;Decreased activity tolerance;Decreased knowledge of use of DME;Decreased knowledge of precautions       PT Treatment Interventions DME instruction;Gait training;Therapeutic exercise;Balance training;Functional mobility training;Therapeutic activities;Patient/family education    PT Goals (Current goals can be found in the Care Plan section)  Acute Rehab PT Goals Patient Stated Goal: to get to rehab PT Goal Formulation: With patient Time For Goal Achievement: 11/25/21 Potential to Achieve Goals: Good    Frequency 7X/week     Co-evaluation               AM-PAC PT "6 Clicks" Mobility  Outcome  Measure Help needed turning from your back to your side while in a flat bed without using bedrails?: A Lot Help needed moving from lying on your back to sitting on the side of a flat bed without using bedrails?: A Lot Help needed moving to and from a bed to a chair (including a wheelchair)?: Total Help needed standing up from a chair using your arms (e.g., wheelchair or bedside chair)?: Total Help needed to walk in hospital room?: Total Help needed climbing 3-5 steps with a railing? : Total 6 Click Score: 8    End of Session   Activity Tolerance: Patient tolerated treatment well;Treatment limited secondary to medical complications (Comment);Patient limited by pain (orthostatic presyncope) Patient left: in bed;with call bell/phone within reach;with nursing/sitter in room Nurse Communication: Mobility status PT Visit Diagnosis: Muscle weakness (generalized) (M62.81);Difficulty in walking, not elsewhere classified (R26.2);Unsteadiness on feet (R26.81);Other abnormalities of gait and mobility  (R26.89);Dizziness and giddiness (R42);Other symptoms and signs involving the nervous system (R29.898)    Time: 7616-0737 PT Time Calculation (min) (ACUTE ONLY): 30 min   Charges:   PT Evaluation $PT Eval Moderate Complexity: 1 Mod PT Treatments $Therapeutic Exercise: 8-22 mins       2:58 PM, 11/11/21 Etta Grandchild, PT, DPT Physical Therapist - [facility]  [phone] ([place])    Bulger C 11/11/2021, 2:51 PM

## 2021-11-11 NOTE — Assessment & Plan Note (Signed)
Orthostatic vitals positive with PT today (9/22).  Suspect this caused unresponsive episode.  Soft BP's due to metoprolol for HR control, currently in A-fib RVR despite metoprolol. 9/25: Orthostatics have finally improved with reduction in metoprolol and addition of digoxin for A-fib.  Blood pressures drop although not as significantly and patient asymptomatic with PT today --On midodrine 10 mg TID --Daily orthostatic vitals --Stop IV fluids --TED hose

## 2021-11-11 NOTE — Progress Notes (Signed)
PT Cancellation Note  Patient Details Name: WILKINS ELPERS MRN: [id number] DOB: 07-07-1940   Cancelled Treatment:    Reason Eval/Treat Not Completed: Patient at procedure or test/unavailable (Chart reviewed, treatment attempted, pt going off unit for imaging study. Will attempts again at later date/time.)  11:45 AM, 11/11/21 Etta Grandchild, PT, DPT Physical Therapist - [facility]  [phone] ([place])    Laurna Shetley C 11/11/2021, 11:45 AM

## 2021-11-11 NOTE — Progress Notes (Addendum)
Progress Note   Patient: Jeremy Herrera EXN:[id number] DOB: 12-06-1940 DOA: 11/10/2021     0 DOS: the patient was seen and examined on 11/11/2021   Brief hospital course: Jeremy Herrera is a 81 y.o. Caucasian male with medical history significant for CHF, type 2 diabetes mellitus, GERD, hypertension, dyslipidemia and BPH, who presented to the ED on 11/10/2021 from [facility]rehab for evaluation after having an episode of unresponsiveness.  He reported feeling very weak all over, but no other symptoms.  Pt had been discharged to rehab on 11/04/2021 after having surgery for left hip fracture repair.  That admission was complicated by A-fib with RVR, hypotension issues and acute blood loss anemia that required vasopressor support and blood transfusion.  He was started on midodrine in order to minimize hypotensive episodes with attempts at HR control with metoprolol.    In the ED, patient was found to be in A-fib with RVR with HR in 110's for the most part.  Vitals also notable for tachypnea.  CXR showed resolving interstitial infiltrates compared to prior.  CT of head  showed no acute findings.  Patient was admitted for evaluation of unresponsive episode, with differential including stroke and syncope.  MRI brain was negative for stroke. Carotid duplex U/S showed no hemodynamically significant stenosis.   Echocardiogram with bubble study showed normal EF 60-65% with normal diastolic parameters and mild-mod MR.  9/22 - pt in A-fib RVR with HR varying from low 100's to 130's on telemetry.  Ongoing tachynpea.   Assessment and Plan: * Unresponsiveness Stroke ruled out.  Suspect transient hypotension in the setting of A-fib with RVR.  MRI brain showed no stroke. Unremarkable echo and carotid duplex U/S.   -Neurochecks- Brain MRI without contrast will be obtained. - Follow up PT/OT/SLP recommendations -Started on aspirin statin on admission. -- Telemetry - Check orthostatic vitals - Monitor for  hypoglycemia  Closed displaced intertrochanteric fracture of left femur (HCC) Recent.  Discharged to SNF/rehab on 9/15.  I reached out to [facility] about removing his staples as pt was scheduled in clinic for that today or tomorrow.  Atrial fibrillation with rapid ventricular response (HCC) BP stable but soft at times.   HR"s varying from low 100's to 130's. --Continue home Toprol-XL 37.5 mg  --IV metoprolol 5 mg 14h PRN for HR>110 - Cardiology consult -- Start Cardizem or amiodarone drip if HR refractory to above --Monitor BP closely --Telemetry  Orthostatic hypotension Orthostatic vitals positive with PT today (9/22).  Suspect this caused unresponsive episode.  Soft BP's due to metoprolol for HR control, currently in A-fib RVR despite metoprolol. --On midodrine 10 mg TID --Daily orthostatic vitals --On IV fluids  GERD without esophagitis --Continue PPI  Dyslipidemia --Continue statin  Atrial fibrillation, chronic ([place]) See A-fib with RVR. S/p Watchman device.  COPD (chronic obstructive pulmonary disease) (HCC) Stable, not exacerbated. Continue Dulera, PRN albuterol Monitor closely.  Diastolic CHF, chronic (HCC) Appear euvolemic, compensated. Continue metoprolol, PRN oral Lasix  Depression --Continue Zoloft  CAD (coronary artery disease) Stable, no active CP. Continue metoprolol, statin, ASA        Subjective: Pt seen awake sitting up in bed during lunch today.  He reports feeling fine despite appearing short of breath.  Says he had a rough time with episode of dyspnea, IV fluids were stopped.     Physical Exam: Vitals:   11/10/21 2200 11/11/21 0034 11/11/21 0357 11/11/21 0756  BP:  (!) 132/91 104/82 112/67  Pulse:  94 98 (!) 110  Resp: (!) 24 20 (!) 22 18  Temp:  98.2 F (36.8 C) (!) 97.4 F (36.3 C) 98.1 F (36.7 C)  TempSrc:  Oral Oral Oral  SpO2:  100% 99% 98%  Weight:  93 kg    Height:  6\' 2"  (1.88 m)     General exam: awake, alert, no acute  distress HEENT: atraumatic, clear conjunctiva, anicteric sclera, moist mucus membranes, hearing grossly normal  Respiratory system: CTAB, no wheezes, rales or rhonchi, normal respiratory effort. Cardiovascular system: irregularly irregular, no pedal edema.   Gastrointestinal system: soft, NT, ND, no HSM felt, +bowel sounds. Central nervous system: A&O x3. no gross focal neurologic deficits, normal speech Extremities: moves all, no edema, normal tone Skin: dry, intact, normal temperature Psychiatry: normal mood, congruent affect, judgement and insight appear normal   Data Reviewed:  Notable labs ---- lipid profile normal except low HDL 34, Hbg 11.5 from 12.6 (after fluids given in ED), Carotid U/S negative for hemodynamically significant stenosis.  Echo normal EF, no shunt seen on bubble study  Family Communication: none at bedside, will attempt to call  Disposition: Status is: Observation The patient remains OBS appropriate and will d/c before 2 midnights.  Planned Discharge Destination: Skilled nursing facility    Time spent: 45 minutes  Author: Ezekiel Slocumb, DO 11/11/2021 4:31 PM  For on call review [email or web address].

## 2021-11-11 NOTE — Assessment & Plan Note (Signed)
Recent.  Discharged to SNF/rehab on 9/15.  I reached out to [facility], they came and removed staples on 9/22, Steri-Strips applied. -- Follow-up with Dr. Posey Pronto in 4 weeks for left femur x-ray

## 2021-11-11 NOTE — Assessment & Plan Note (Signed)
Appear euvolemic, compensated. Continue metoprolol, PRN oral Lasix

## 2021-11-11 NOTE — Assessment & Plan Note (Signed)
Stable, no active CP. Continue metoprolol, statin, ASA

## 2021-11-11 NOTE — Plan of Care (Signed)

## 2021-11-11 NOTE — Assessment & Plan Note (Signed)
See A-fib with RVR. S/p Watchman device.

## 2021-11-11 NOTE — Evaluation (Addendum)
Clinical/Bedside Swallow Evaluation Patient Details  Name: Jeremy Herrera MRN: [id number] Date of Birth: 09/23/1940  Today's Date: 11/11/2021 Time: SLP Start Time (ACUTE ONLY): 1607 SLP Stop Time (ACUTE ONLY): 1020 SLP Time Calculation (min) (ACUTE ONLY): 55 min  Past Medical History:  Past Medical History:  Diagnosis Date   Anemia    BPH (benign prostatic hyperplasia)    CHF (congestive heart failure) (HCC)    Diabetes mellitus without complication (HCC)    Dysphagia    GERD (gastroesophageal reflux disease)    HLD (hyperlipidemia)    HTN (hypertension)    Irregular heart beat    Nocturia    Urinary retention    Past Surgical History:  Past Surgical History:  Procedure Laterality Date   AMPUTATION TOE Left 05/04/2017   Procedure: AMPUTATION TOE - third left toe;  Surgeon: Jeremy Herrera, DPM;  Location: [facility];  Service: Podiatry;  Laterality: Left;   bladder patching  1964   HIP SURGERY Right 2014   INTRAMEDULLARY (IM) NAIL INTERTROCHANTERIC Left 10/25/2021   Procedure: INTRAMEDULLARY (IM) NAIL INTERTROCHANTERIC;  Surgeon: Jeremy Kell, MD;  Location: [facility];  Service: Orthopedics;  Laterality: Left;   IRRIGATION AND DEBRIDEMENT FOOT Left 11/14/2016   Procedure: IRRIGATION AND DEBRIDEMENT FOOT;  Surgeon: Jeremy Herrera, DPM;  Location: [facility];  Service: Podiatry;  Laterality: Left;   TRANSURETHRAL RESECTION OF PROSTATE     HPI:  Pt is a 81 y.o. Caucasian male with medical history significant for CHF, type 2 diabetes mellitus, GERD, hypertension, dyslipidemia and BPH, who presented to the emergency room with acute onset of unresponsiveness.  The patient was coming back from Physical therapy and they brought lunch to him.  He felt so tired and weak all over after Physical therapy while sitting and became unresponsive -- he DENIED any swallowing problem w/ the meal, just extreme fatigue.  He is in Rehab after recent Fall and hip surgery for fracture.  He denies any chest pain  or palpitations.  No headache or dizziness or blurred vision or paresthesias or focal muscle weakness.  He was having slurred speech though and denies any expressive dysphasia or dysphagia.  No new urinary or stool incontinence.  No nausea or vomiting or abdominal pain.  No dysuria, oliguria or hematuria or flank pain.  He is having dry mouth.  mechanical fall. Pt has a Baseline of Dysphagia -- see MBSS in 2017 which revealed oropharyngeal phase dysphagia and "study is consistent with neuromuscular impairment as well as the effects of laryngopharyngeal reflux (inflammation, edema, and resultant decreased sensation of the larynx and pharynx). Recommend consult with ENT and management of REFLUX.".  Pt has reported hx of REFLUX for which he takes Nexium at home per pt. He also reports that he will throat clear consistently throughout the day IN ABSENCE OF oral intake; reports intermittent episodes of choking/coughing at home at baseline. He recalls his dysphagia and need for aspiration precautions of small bites/sips slowly.  Portable chest x-ray showed cardiomegaly with partial clearing of interstitial infiltrates suggesting resolving CHF or multifocal pneumonia that is resolving.  He had no new infiltrates.  Noncontrasted CT scan revealed age-indeterminate small vessel ischemic changes within the periventricular white matter in the right basal ganglia with otherwise no acute infarct or hemorrhage.    Assessment / Plan / Recommendation  Clinical Impression   Pt seen for BSE; education of his Baseline GERD w/ Dysphagia and aspiration/Reflux precautions. Pt alert; Mild Confusion -- has Baseline Cognitive decline per chart. Pt  also has Baseline of REFLUX/GERD. Pt was talkative describing the syncope event leading him to the ED; noted min increased WOB w/ just the exertion of talking -- Rest Breaks encouraged. HR 114-120, RR mid 20s-28 intermittently. Missing Dentition. On RA; afebrile, WBC WNL. Chest Imaging  improved from last admit. Recent admit and BSE on 10/28/2021.   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 Pt also had esophageal dilation ~10 years ago which he reported alleviated several of his dysphagia symptoms but suspects this could be an ongoing issue for him.". No further f/u w/ GI nor endoscopy noted in chart.    Pt and Son reported that pt throat clears consistently throughout the day IN ABSENCE OF oral intake. This appears as "tic-like" behavior.  He describes and states the swallow strategies he utilizes including small bites and sips, thorough mastication, subsequent swallows, and alternating foods/liquids(liquid washes) to "help me not choke".    The patient denied any acute changes in swallow function; Son agreed stating swallowing appears at the pt's baseline. Pt was supported min more upright w/ pillows low behind back to open chest more. He consumed po trials of puree, softened solids (d/t poor Dentition status), and thin liquids via straw (he and Son stated he drinks from a straw at home d/t difficulty holding a cup w/ his weak hands) w/ No immediate coughing noted; no decline in vocal quality or respiratory status during po trials. (Noted the mild throat clearing -- almost like a tic-like habit intermittently b/t trials-- this did not increase d/t the oral intake). Pt stated he is aware of the need to follow strategies and aspiration precautions to reduce his risk for aspiration. Pt has had no recent report of PNA -- last CXR was in 2021. He reports using oral care, but has been less ambulatory since Fall and hip fx. "I am just really weak".     With small boluses of thin liquids and soft, moist foods, pt feels he "manages ok". He stated he  "knows" that he "has more trouble" eating tougher foods, "so I have to be more careful".  Neuromuscular impairment as well as the effects of laryngopharyngeal reflux (inflammation, edema, and resultant decreased sensation of the larynx and pharynx) were suggested on MBSS. Pt has reported hx of Reflux for which he takes Nexium.  Increased sedentary status w/ Chronic dysphagia could increase risk for aspiration pneumonia. Encouraged use of Incentive Spirometer for respiratory ex d/t sedentary status as was given last week+ at admit. Pt appears at/close to his baseline from last admit ~1-2 weeks ago.   Recommend continue dysphagia 3 diet with thin liquids and adherence to aspiration/REFLUX precautions and compensatory swallowing strategies in setting of Chronic dysphagia/GERD and risk for aspiration/aspiration pneumonia. Pt and Son are aware of this. Pt is able to state back swallowing strategies and precautions he follows. Recommend rest breaks during meals; No Talking during oral intake. Setup support at meals. Pills in Puree for swallowing and clearing of the Esophagus. Encouraged mobility and use of a Dysphagia drink cup in order to lessen risk of aspiration, as was given last admit.  Recommend f/u w/ GI/ENT if any further c/o Esophageal phase issues/food dysmotility as he has the h/o Esophageal dilation, REFLUX. NSG to reconsult if any change/decline in status while admitted. MD updated. SLP Visit Diagnosis:  Dysphagia, unspecified (R13.10) (baseline Dysphagia; GERD)    Aspiration Risk  Mild aspiration risk;Risk for inadequate nutrition/hydration (reduced following general precautions)    Diet Recommendation   dysphagia 3 diet with thin liquids and adherence to aspiration/REFLUX precautions and compensatory swallowing strategies; tray setup and positioning at meals; REST BREAKS before/during meals for conservation of energy and to allow for Esophageal clearing.  Medication Administration: Whole meds  with puree (vs need to Crush in Puree, for safer swallowing)    Other  Recommendations Recommended Consults: Consider GI evaluation (Dietician f/u; Palliative Care f/u) Oral Care Recommendations: Oral care BID;Oral care before and after PO;Staff/trained caregiver to provide oral care (support pt) Other Recommendations:  (n/a)    Recommendations for follow up therapy are one component of a multi-disciplinary discharge planning process, led by the attending physician.  Recommendations may be updated based on patient status, additional functional criteria and insurance authorization.  Follow up Recommendations No SLP follow up      Assistance Recommended at Discharge Intermittent Supervision/Assistance  Functional Status Assessment Patient has had a recent decline in their functional status and demonstrates the ability to make significant improvements in function in a reasonable and predictable amount of time.  Frequency and Duration  (n/a)   (n/a)       Prognosis Prognosis for Safe Diet Advancement: Fair (-Good) Barriers to Reach Goals: Time post onset;Severity of deficits;Behavior Barriers/Prognosis Comment: pt is challenged by significant deconditioning; baseline GERD and dysphagia from such suspected      Swallow Study   General Date of Onset: 11/10/21 HPI: Pt is a 81 y.o. Caucasian male with medical history significant for CHF, type 2 diabetes mellitus, GERD, hypertension, dyslipidemia and BPH, who presented to the emergency room with acute onset of unresponsiveness.  The patient was coming back from Physical therapy and they brought lunch to him.  He felt so tired and weak all over after Physical therapy while sitting and became unresponsive -- he DENIED any swallowing problem w/ the meal, just extreme fatigue.  He is in Rehab after recent Fall and hip surgery for fracture.  He denies any chest pain or palpitations.  No headache or dizziness or blurred vision or paresthesias or focal  muscle weakness.  He was having slurred speech though and denies any expressive dysphasia or dysphagia.  No new urinary or stool incontinence.  No nausea or vomiting or abdominal pain.  No dysuria, oliguria or hematuria or flank pain.  He is having dry mouth.  mechanical fall. Pt has a Baseline of Dysphagia -- see MBSS in 2017 which revealed oropharyngeal phase dysphagia and "study is consistent with neuromuscular impairment as well as the effects of laryngopharyngeal reflux (inflammation, edema, and resultant decreased sensation of the larynx and pharynx). Recommend consult with ENT and management of REFLUX.".  Pt has reported hx of REFLUX for which he takes Nexium at home per pt. He also reports that he will throat clear consistently throughout the day IN ABSENCE OF oral intake; reports intermittent episodes of choking/coughing at home at baseline. He recalls his dysphagia and need for aspiration precautions of small bites/sips slowly.  Portable chest x-ray showed cardiomegaly with partial clearing of interstitial infiltrates suggesting resolving CHF or multifocal pneumonia that is resolving.  He had no new infiltrates.  Noncontrasted CT scan revealed age-indeterminate small vessel ischemic changes within the periventricular white matter in the right basal ganglia with otherwise no acute infarct or hemorrhage. Type of Study: Bedside Swallow Evaluation Previous Swallow Assessment: 10/28/2021-- BSE recommending  a Mech Soft diet for d/c.  Last MBSS on record was completed on 03/16/2015, revealing "moderate oropharyngeal dysphagia characterized by pre-swallow loss of solid and liquid, delayed pharyngeal swallow initiation, decreased tongue base retraction, and decreased pharyngeal pressure generation." Clinical swallow assessment completed on 11/15/2016 with recommendations for Dys 3 solids and thin liquids. Diet Prior to this Study: Dysphagia 3 (soft);Thin liquids Temperature Spikes Noted: No (wbc 5.7) Respiratory  Status: Room air History of Recent Intubation: No Behavior/Cognition: Alert;Cooperative;Pleasant mood;Distractible;Requires cueing (throat clearing "tic-like" behavior -- Son and pt endorsed ongoing for "years") Oral Cavity Assessment: Within Functional Limits Oral Care Completed by SLP: Yes Oral Cavity - Dentition: Poor condition;Missing dentition Vision: Functional for self-feeding Self-Feeding Abilities: Able to feed self;Needs assist;Needs set up Patient Positioning: Upright in bed (needed some extra help w/ positioning for pillows lower behind back) Baseline Vocal Quality: Normal (gravely) Volitional Cough: Strong Volitional Swallow: Able to elicit    Oral/Motor/Sensory Function Overall Oral Motor/Sensory Function: Within functional limits   Ice Chips Ice chips: Not tested   Thin Liquid Thin Liquid: Within functional limits Presentation: Self Fed;Straw (10+ trials) Pharyngeal  Phase Impairments:  (no immediate overt s/s; clear vocal quality) Other Comments: chronic throat clearing "tics' - no change from baseline    Nectar Thick Nectar Thick Liquid: Not tested   Honey Thick Honey Thick Liquid: Not tested   Puree Puree: Within functional limits Presentation: Self Fed;Spoon (3-4 trials) Other Comments: chronic throat clearing "tics' - no change from baseline   Solid     Solid: Impaired (min+) Presentation: Self Fed;Spoon (3-4 trials) Oral Phase Impairments: Impaired mastication (missing dentition) Oral Phase Functional Implications:  (increased time) Pharyngeal Phase Impairments:  (no immediate overt s/s) Other Comments: min increased WOB w/ exertion noted        Jerilynn SomKatherine Torence Palmeri, MS, CCC-SLP Speech Language Pathologist Rehab Services; [facility] [place] [phone] (ascom) [place] Fluegel 11/11/2021,1:34 PM

## 2021-11-11 NOTE — Hospital Course (Signed)
Jeremy Herrera is a 81 y.o. Caucasian male with medical history significant for CHF, type 2 diabetes mellitus, GERD, hypertension, dyslipidemia and BPH, who presented to the ED on 11/10/2021 from [facility]rehab for evaluation after having an episode of unresponsiveness.  He reported feeling very weak all over, but no other symptoms.  Pt had been discharged to rehab on 11/04/2021 after having surgery for left hip fracture repair.  That admission was complicated by A-fib with RVR, hypotension issues and acute blood loss anemia that required vasopressor support and blood transfusion.  He was started on midodrine in order to minimize hypotensive episodes with attempts at HR control with metoprolol.    In the ED, patient was found to be in A-fib with RVR with HR in 110's for the most part.  Vitals also notable for tachypnea.  CXR showed resolving interstitial infiltrates compared to prior.  CT of head  showed no acute findings.  Patient was admitted for evaluation of unresponsive episode, with differential including stroke and syncope.  MRI brain was negative for stroke. Carotid duplex U/S showed no hemodynamically significant stenosis.   Echocardiogram with bubble study showed normal EF 60-65% with normal diastolic parameters and mild-mod MR.  9/22-24 - pt in A-fib RVR with HR varying from low 100's to 130's on telemetry.  Ongoing tachynpea.  9/24 -added digoxin, metoprolol dose reduced 9/25 -improved heart rates and orthostatic symptoms 9/26 -HR earlier 130 (before meds), given PO meds and IV Lopressor push with good response.  Feels unwell in general, very fatigued.

## 2021-11-11 NOTE — Assessment & Plan Note (Signed)
Stable, not exacerbated. Continue Dulera, PRN albuterol Monitor closely.

## 2021-11-11 NOTE — Progress Notes (Signed)
*  PRELIMINARY RESULTS* Echocardiogram 2D Echocardiogram has been performed.  Jeremy Herrera 11/11/2021, 8:55 AM

## 2021-11-11 NOTE — Progress Notes (Addendum)
Pt complaints of SOB and have a 0.9 % sodium chloride running. MD Mansy made aware. Wilkl contineu to monitor.  Update 0430: MD Mansy orderded to stopped fluids and placed additional order. Will continue to monitor.

## 2021-11-11 NOTE — Progress Notes (Signed)
   Subjective:   s/p L Hip IM nail 10/25/21 Patient reports pain as mild.   Patient is well, and has had no acute complaints or problems Denies any CP, SOB, ABD pain. We will continue therapy today.   Objective: Vital signs in last 24 hours: Temp:  [97.4 F (36.3 C)-98.2 F (36.8 C)] 97.6 F (36.4 C) (09/22 1653) Pulse Rate:  [83-110] 97 (09/22 1653) Resp:  [12-25] 18 (09/22 1653) BP: (103-135)/(60-93) 119/75 (09/22 1653) SpO2:  [95 %-100 %] 97 % (09/22 1653) Weight:  [93 kg] 93 kg (09/22 0034)  Intake/Output from previous day: 09/21 0701 - 09/22 0700 In: 1000 [IV Piggyback:1000] Out: 350 [Urine:350] Intake/Output this shift: Total I/O In: 422.5 [P.O.:240; I.V.:182.5] Out: 2500 [Urine:2500]  Recent Labs    11/10/21 1534 11/11/21 0914  HGB 12.6* 11.5*   Recent Labs    11/10/21 1534 11/11/21 0914  WBC 8.4 5.7  RBC 4.08* 3.77*  HCT 38.8* 35.7*  PLT 249 241   Recent Labs    11/10/21 1534 11/11/21 0914  NA 140 139  K 4.4 3.9  CL 106 106  CO2 24 25  BUN 17 13  CREATININE 0.94 0.95  GLUCOSE 123* 103*  CALCIUM 9.2 8.7*   No results for input(s): "LABPT", "INR" in the last 72 hours.  EXAM General - Patient is Alert, Appropriate, and Oriented Extremity - Neurovascular intact Sensation intact distally Intact pulses distally Dorsiflexion/Plantar flexion intact Dressing - dressing C/D/I and no drainage, staples removed and steri strips applied. No warmth redness or drainage Motor Function - intact, moving foot and toes well on exam.   Past Medical History:  Diagnosis Date   Anemia    BPH (benign prostatic hyperplasia)    CHF (congestive heart failure) (HCC)    Diabetes mellitus without complication (HCC)    Dysphagia    GERD (gastroesophageal reflux disease)    HLD (hyperlipidemia)    HTN (hypertension)    Irregular heart beat    Nocturia    Urinary retention     Assessment/Plan:      Principal Problem:   Unresponsiveness Active Problems:   CAD  (coronary artery disease)   Atrial fibrillation with rapid ventricular response (HCC)   Depression   Closed displaced intertrochanteric fracture of left femur (HCC)   Diastolic CHF, chronic (HCC)   COPD (chronic obstructive pulmonary disease) (HCC)   Atrial fibrillation, chronic (HCC)   Dyslipidemia   GERD without esophagitis   Orthostatic hypotension  Estimated body mass index is 26.32 kg/m as calculated from the following:   Height as of this encounter: 6\' 2"  (1.88 m).   Weight as of this encounter: 93 kg. Advance diet Up with therapy Staples removed and steri strips applied. Follow up with Dr. Posey Pronto in 4 weeks for left femur xrays  T. Rachelle Hora, PA-C [place] 11/11/2021, 5:06 PM

## 2021-11-12 DIAGNOSIS — Z89422 Acquired absence of other left toe(s): Secondary | ICD-10-CM | POA: Diagnosis not present

## 2021-11-12 DIAGNOSIS — I951 Orthostatic hypotension: Secondary | ICD-10-CM | POA: Diagnosis present

## 2021-11-12 DIAGNOSIS — Z20822 Contact with and (suspected) exposure to covid-19: Secondary | ICD-10-CM | POA: Diagnosis present

## 2021-11-12 DIAGNOSIS — R682 Dry mouth, unspecified: Secondary | ICD-10-CM | POA: Diagnosis present

## 2021-11-12 DIAGNOSIS — I2510 Atherosclerotic heart disease of native coronary artery without angina pectoris: Secondary | ICD-10-CM | POA: Diagnosis present

## 2021-11-12 DIAGNOSIS — Z833 Family history of diabetes mellitus: Secondary | ICD-10-CM | POA: Diagnosis not present

## 2021-11-12 DIAGNOSIS — Z818 Family history of other mental and behavioral disorders: Secondary | ICD-10-CM | POA: Diagnosis not present

## 2021-11-12 DIAGNOSIS — R531 Weakness: Secondary | ICD-10-CM | POA: Diagnosis present

## 2021-11-12 DIAGNOSIS — N400 Enlarged prostate without lower urinary tract symptoms: Secondary | ICD-10-CM | POA: Diagnosis present

## 2021-11-12 DIAGNOSIS — R4781 Slurred speech: Secondary | ICD-10-CM | POA: Diagnosis present

## 2021-11-12 DIAGNOSIS — F32A Depression, unspecified: Secondary | ICD-10-CM | POA: Diagnosis present

## 2021-11-12 DIAGNOSIS — E119 Type 2 diabetes mellitus without complications: Secondary | ICD-10-CM | POA: Diagnosis present

## 2021-11-12 DIAGNOSIS — Z885 Allergy status to narcotic agent status: Secondary | ICD-10-CM | POA: Diagnosis not present

## 2021-11-12 DIAGNOSIS — Z95818 Presence of other cardiac implants and grafts: Secondary | ICD-10-CM | POA: Diagnosis not present

## 2021-11-12 DIAGNOSIS — R4189 Other symptoms and signs involving cognitive functions and awareness: Secondary | ICD-10-CM | POA: Diagnosis not present

## 2021-11-12 DIAGNOSIS — Z8249 Family history of ischemic heart disease and other diseases of the circulatory system: Secondary | ICD-10-CM | POA: Diagnosis not present

## 2021-11-12 DIAGNOSIS — I110 Hypertensive heart disease with heart failure: Secondary | ICD-10-CM | POA: Diagnosis present

## 2021-11-12 DIAGNOSIS — K219 Gastro-esophageal reflux disease without esophagitis: Secondary | ICD-10-CM | POA: Diagnosis present

## 2021-11-12 DIAGNOSIS — Z7982 Long term (current) use of aspirin: Secondary | ICD-10-CM | POA: Diagnosis not present

## 2021-11-12 DIAGNOSIS — I5032 Chronic diastolic (congestive) heart failure: Secondary | ICD-10-CM | POA: Diagnosis present

## 2021-11-12 DIAGNOSIS — X58XXXD Exposure to other specified factors, subsequent encounter: Secondary | ICD-10-CM | POA: Diagnosis present

## 2021-11-12 DIAGNOSIS — Z66 Do not resuscitate: Secondary | ICD-10-CM | POA: Diagnosis present

## 2021-11-12 DIAGNOSIS — J449 Chronic obstructive pulmonary disease, unspecified: Secondary | ICD-10-CM | POA: Diagnosis present

## 2021-11-12 DIAGNOSIS — E785 Hyperlipidemia, unspecified: Secondary | ICD-10-CM | POA: Diagnosis present

## 2021-11-12 DIAGNOSIS — Z7401 Bed confinement status: Secondary | ICD-10-CM | POA: Diagnosis not present

## 2021-11-12 DIAGNOSIS — Z79899 Other long term (current) drug therapy: Secondary | ICD-10-CM | POA: Diagnosis not present

## 2021-11-12 DIAGNOSIS — I482 Chronic atrial fibrillation, unspecified: Secondary | ICD-10-CM | POA: Diagnosis present

## 2021-11-12 LAB — CBC
HCT: 33.2 % — ABNORMAL LOW (ref 39.0–52.0)
Hemoglobin: 11.1 g/dL — ABNORMAL LOW (ref 13.0–17.0)
MCH: 31.1 pg (ref 26.0–34.0)
MCHC: 33.4 g/dL (ref 30.0–36.0)
MCV: 93 fL (ref 80.0–100.0)
Platelets: 212 10*3/uL (ref 150–400)
RBC: 3.57 MIL/uL — ABNORMAL LOW (ref 4.22–5.81)
RDW: 16.2 % — ABNORMAL HIGH (ref 11.5–15.5)
WBC: 5.8 10*3/uL (ref 4.0–10.5)
nRBC: 0 % (ref 0.0–0.2)

## 2021-11-12 LAB — BRAIN NATRIURETIC PEPTIDE: B Natriuretic Peptide: 79.2 pg/mL (ref 0.0–100.0)

## 2021-11-12 LAB — BASIC METABOLIC PANEL
Anion gap: 9 (ref 5–15)
BUN: 17 mg/dL (ref 8–23)
CO2: 23 mmol/L (ref 22–32)
Calcium: 8.7 mg/dL — ABNORMAL LOW (ref 8.9–10.3)
Chloride: 107 mmol/L (ref 98–111)
Creatinine, Ser: 1.02 mg/dL (ref 0.61–1.24)
GFR, Estimated: >60 mL/min (ref >60)
Glucose, Bld: 99 mg/dL (ref 70–99)
Potassium: 3.9 mmol/L (ref 3.5–5.1)
Sodium: 139 mmol/L (ref 135–145)

## 2021-11-12 LAB — MAGNESIUM: Magnesium: 1.9 mg/dL (ref 1.7–2.4)

## 2021-11-12 LAB — PROCALCITONIN: Procalcitonin: <0.1 ng/mL

## 2021-11-12 MED ORDER — SODIUM CHLORIDE 0.9 % IV SOLN: INTRAVENOUS | Status: DC

## 2021-11-12 NOTE — TOC Initial Note (Signed)
Transition of Care [facility]) - Initial/Assessment Note    Patient Details  Name: Jeremy Herrera MRN: [id number] Date of Birth: Feb 02, 1941  [facility]) CM/SW Contact:    Valente David, RN Phone Number: 11/12/2021, 11:16 AM  Clinical Narrative:                  Patient readmitted to hospital from [facility] where he was for short term.  Spoke with daughter Graciella Belton, state the plan is to return.  They are not currently holding bed as this would have cost about $500/day.  Daughter was told there would be plenty of beds when he was ready to return.  FL2 completed, bed search initiated to [facility] only per daughter's request.   Expected Discharge Plan: Skilled Nursing Facility Barriers to Discharge: Continued Medical Work up   Patient Goals and CMS Choice Patient states their goals for this hospitalization and ongoing recovery are:: SNF for short term rehab CMS Medicare.gov Compare Post Acute Care list provided to:: Patient Represenative (must comment) Graciella Belton, daughter) Choice offered to / list presented to : Adult Children  Expected Discharge Plan and Services Expected Discharge Plan: [place]   Discharge Planning Services: CM Consult Post Acute Care Choice: [place] Living arrangements for the past 2 months: Single Family Home                                      Prior Living Arrangements/Services Living arrangements for the past 2 months: Single Family Home Lives with:: Adult Children Patient language and need for interpreter reviewed:: Yes Do you feel safe going back to the place where you live?: Yes      Need for Family Participation in Patient Care: Yes (Comment) Care giver support system in place?: Yes (comment) Current home services: DME Criminal Activity/Legal Involvement Pertinent to Current Situation/Hospitalization: No - Comment as needed  Activities of Daily Living Home Assistive Devices/Equipment:  Walker (specify type) ADL Screening (condition at time of admission) Patient's cognitive ability adequate to safely complete daily activities?: Yes Is the patient deaf or have difficulty hearing?: No Does the patient have difficulty seeing, even when wearing glasses/contacts?: No Does the patient have difficulty concentrating, remembering, or making decisions?: No Patient able to express need for assistance with ADLs?: Yes Does the patient have difficulty dressing or bathing?: No Independently performs ADLs?: Yes (appropriate for developmental age) Does the patient have difficulty walking or climbing stairs?: Yes Weakness of Legs: Both Weakness of Arms/Hands: None  Permission Sought/Granted Permission sought to share information with : Family Supports, Case Freight forwarder, Chartered certified accountant granted to share information with : Yes, Verbal Permission Granted  Share Information with NAME: Nonie Hoyer  Permission granted to share info w AGENCY: SNFs  Permission granted to share info w Relationship: Graciella Belton, daughter  Permission granted to share info w Contact Information: [phone]  Emotional Assessment   Attitude/Demeanor/Rapport: Engaged Affect (typically observed): Calm Orientation: : Oriented to Self, Oriented to Place, Oriented to  Time, Oriented to Situation   Psych Involvement: No (comment)  Admission diagnosis:  Weakness [R53.1] Unresponsiveness [R41.89] Patient Active Problem List   Diagnosis Date Noted   Orthostatic hypotension 11/11/2021   Unresponsiveness 11/10/2021   Dyslipidemia 11/10/2021   GERD without esophagitis 11/10/2021   Weakness    Slurred speech    Postoperative atrial fibrillation w/ RVR and associated hypotension requiring vasopressor support in  ICU 10/26/2021   Malnutrition of moderate degree 10/26/2021   Hypotension after procedure    Closed displaced intertrochanteric fracture of left femur ([place]) 60/73/7106   Diastolic CHF,  chronic (HCC) 10/25/2021   COPD (chronic obstructive pulmonary disease) ([place]) 10/25/2021   Atrial fibrillation, chronic (HCC) 10/25/2021   Mild aortic valve stenosis 11/05/2019   Acute respiratory failure with hypoxia ([place]) 05/16/2019   CAP (community acquired pneumonia) 05/16/2019   Acute on chronic systolic CHF (congestive heart failure) ([place]) 05/16/2019   HLD (hyperlipidemia) 05/16/2019   Depression 05/16/2019   Lower limb ulcer, calf, left, limited to breakdown of skin ([place]) 05/06/2019   Swelling of limb 05/06/2019   CAD (coronary artery disease) 08/07/2018   Erythema of lower extremity 08/07/2018   Mild protein-calorie malnutrition ([place]) 06/18/2018   Acute respiratory distress 06/12/2018   Dyspnea 06/12/2018   Altered mental status 08/19/2017   Headache 08/19/2017   Tinnitus 08/19/2017   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[place]) 05/02/2017   Unsteadiness 04/12/2017   Lung nodule 03/11/2017   Nausea 12/18/2016   Diabetic foot ulcer ([place]) 11/13/2016   Leg pain 05/07/2016   Acute on chronic combined systolic and diastolic heart failure ([place]) 05/06/2016   Atrial fibrillation with rapid ventricular response ([place]) 05/06/2016   High risk medication use 11/23/2015   Dysphagia 10/02/2015   Loss of memory 04/29/2015   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   Venous insufficiency of both lower extremities 12/17/2014   Bilateral lower extremity edema 11/23/2014   Nocturia 10/17/2014   History of urinary retention 10/17/2014   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   B12 deficiency 09/30/2014   Right leg swelling 09/30/2014   Anemia 09/16/2014   Thrombocytopenia ([place]) 09/16/2014   Benign essential HTN 06/08/2014   Combined fat and carbohydrate induced hyperlipemia 06/08/2014   Beat, premature ventricular 12/03/2013   Heart valve disease 12/03/2013   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   Ventricular premature depolarization 12/03/2013   Benign fibroma of prostate 11/20/2013    Diabetes mellitus, type 2 ([place]) 11/20/2013   Acid reflux 11/20/2013   Anemia, iron deficiency 11/20/2013   Abnormal presence of protein in urine 11/20/2013   Peripheral sensory neuropathy 11/20/2013   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

## 2021-11-12 NOTE — Progress Notes (Signed)
Physical Therapy Treatment Patient Details Name: Jeremy Herrera MRN: [id number] DOB: [birth year] Today's Date: 11/12/2021   History of Present Illness Koleman Marling is an 22yoM who comes to [facility] on 11/10/21 after syncope at facility, pt coming in from [facility] where pt was there for STR since 11/04/21 for rehab needs s/p IM fixation of Left hip fracture. While previously admitted pt had several days of labile BP and HR with transition from supine to not-supine. CHF, DM, GERD, HTN, HLD, falls.    PT Comments    Pt was long sitting (HOB elevated ~20 degrees) upon arriving. He is A and O and cooperative throughout. Endorses having L ankle pain but willing to participate. Pt is extremely motivated and pleasant but severely debilitated. Pt unable to tolerate much activity. He was in A fib throughout most of session with HR between 90-115 bpm but did have one occasion of HR dropping to 77 bpm and elevating to 140 bpm. RR also elevated with very minimal activity. RR did hit 40 at times but mostly in upper 20s. Resting BP 114/72 (81), he required no physical assistance but does rely heavily on bed rails to achieve EOB short sit. BP upon sitting up dropped to 70/59. No reports of dizziness. BP elevated the longer he sat EOB. 2 minutes after sitting up BP: 90/70 4 minute:107/87 5 minutes: 117/77. He stood EOB (elevated) with mod assist and tolerated standing x ~ 30 sec. BP in standing dropped to 95/72 (81). Continues to endorse no symptoms of hypotension but does fatigue quickly.  He requested to return to bed 2/2 to fatigue. BP once back in supine 143/77. Pt still alert but a little more lethargic and endorsing feeling very tired. Overall pt continues to be severely limited/deconditioned. He will need continued skilled PT to maximize activity tolerance while also maximizing independence with ADLs. Acute PT will continue to follow.    Recommendations for follow up therapy are one component of a  multi-disciplinary discharge planning process, led by the attending physician.  Recommendations may be updated based on patient status, additional functional criteria and insurance authorization.  Follow Up Recommendations  Skilled nursing-short term rehab (<3 hours/day)     Assistance Recommended at Discharge Frequent or constant Supervision/Assistance  Patient can return home with the following Two people to help with walking and/or transfers;Two people to help with bathing/dressing/bathroom;Help with stairs or ramp for entrance;Assist for transportation;A little help with bathing/dressing/bathroom   Equipment Recommendations  None recommended by PT       Precautions / Restrictions Precautions Precautions: Fall Restrictions Weight Bearing Restrictions: Yes RUE Weight Bearing: Weight bearing as tolerated LLE Weight Bearing: Weight bearing as tolerated     Mobility  Bed Mobility Overal bed mobility: Needs Assistance Bed Mobility: Supine to Sit, Sit to Supine     Supine to sit: Supervision, HOB elevated Sit to supine: Supervision   General bed mobility comments: pt was able to achieve EOB short sit from semi-supine (HOB elevated ~ 20 degrees) without physical assistance however heavy use of bedrails    Transfers Overall transfer level: Needs assistance Equipment used: Rolling walker (2 wheels) Transfers: Sit to/from Stand Sit to Stand: Mod assist, From elevated surface  General transfer comment: mod assist to stand from elevated bed height to RW    Ambulation/Gait    General Gait Details: unsafe to progress away form EOB    Balance Overall balance assessment: Needs assistance Sitting-balance support: Feet supported Sitting balance-Leahy Scale: Good  Standing balance support: Bilateral upper extremity supported Standing balance-Leahy Scale: Poor Standing balance comment: due to LLE pain. pt has R lateral lean. poor standing tolerance. stood for ~ 30 sec prior to  requiring seated rest         Cognition Arousal/Alertness: Awake/alert Behavior During Therapy: WFL for tasks assessed/performed Overall Cognitive Status: Within Functional Limits for tasks assessed      General Comments: Pt is alert and able to follow commands consistently throughout. very pleasant and hard working but severely deconditioned with poor response to minimal activity.               Pertinent Vitals/Pain Pain Assessment Pain Assessment: 0-10 Pain Score: 5  Pain Location: L LE w/ standing    Home Living Family/patient expects to be discharged to:: Private residence Living Arrangements: Children Available Help at Discharge: Family;Available PRN/intermittently Type of Home: House Home Access: Stairs to enter Entrance Stairs-Rails: None Entrance Stairs-Number of Steps: 1   Home Layout: One level Home Equipment: Conservation officer, nature (2 wheels);Rollator (4 wheels);Cane - single point Additional Comments: has been at [facility] in week prior to admission, s/p hip fx        PT Goals (current goals can now be found in the care plan section) Acute Rehab PT Goals Patient Stated Goal: get better Progress towards PT goals: Not progressing toward goals - comment (limited by cardiopulmonary response to minimal activity)    Frequency    7X/week      PT Plan Current plan remains appropriate    Co-evaluation     PT goals addressed during session: Mobility/safety with mobility;Balance        AM-PAC PT "6 Clicks" Mobility   Outcome Measure  Help needed turning from your back to your side while in a flat bed without using bedrails?: A Lot Help needed moving from lying on your back to sitting on the side of a flat bed without using bedrails?: A Lot Help needed moving to and from a bed to a chair (including a wheelchair)?: A Lot Help needed standing up from a chair using your arms (e.g., wheelchair or bedside chair)?: A Lot Help needed to walk in hospital  room?: Total Help needed climbing 3-5 steps with a railing? : Total 6 Click Score: 10    End of Session   Activity Tolerance: Patient limited by fatigue;Treatment limited secondary to medical complications (Comment) (limited by cardiopulmonary response to minimal activity) Patient left: in bed;with call bell/phone within reach;with bed alarm set Nurse Communication: Mobility status PT Visit Diagnosis: Muscle weakness (generalized) (M62.81);Difficulty in walking, not elsewhere classified (R26.2);Unsteadiness on feet (R26.81);Other abnormalities of gait and mobility (R26.89);Dizziness and giddiness (R42);Other symptoms and signs involving the nervous system (R29.898) Pain - Right/Left: Left Pain - part of body: Ankle and joints of foot     Time: [id number] PT Time Calculation (min) (ACUTE ONLY): 25 min  Charges:  $Therapeutic Activity: 23-37 mins                     Julaine Fusi PTA 11/12/21, 3:28 PM

## 2021-11-12 NOTE — NC FL2 (Signed)
[place] MEDICAID FL2 LEVEL OF CARE SCREENING TOOL     IDENTIFICATION  Patient Name: Jeremy Herrera Birthdate: 08-12-1940 Sex: male Admission Date (Current Location): 11/10/2021  [place] and [place] Number:  Chiropodist and Address:  [facility], [address], [place], [place] [address]      Provider Number: [id number]  Attending Physician Name and Address:  Pennie Banter, DO  Relative Name and Phone Number:  Virgina Evener (Daughter) [phone]    Current Level of Care: Hospital Recommended Level of Care: Skilled Nursing Facility Prior Approval Number:    Date Approved/Denied:   PASRR Number: [id number]  Discharge Plan: SNF    Current Diagnoses: Patient Active Problem List   Diagnosis Date Noted   Orthostatic hypotension 11/11/2021   Unresponsiveness 11/10/2021   Dyslipidemia 11/10/2021   GERD without esophagitis 11/10/2021   Weakness    Slurred speech    Postoperative atrial fibrillation w/ RVR and associated hypotension requiring vasopressor support in ICU 10/26/2021   Malnutrition of moderate degree 10/26/2021   Hypotension after procedure    Closed displaced intertrochanteric fracture of left femur (HCC) 10/25/2021   Diastolic CHF, chronic (HCC) 10/25/2021   COPD (chronic obstructive pulmonary disease) (HCC) 10/25/2021   Atrial fibrillation, chronic (HCC) 10/25/2021   Mild aortic valve stenosis 11/05/2019   Acute respiratory failure with hypoxia (HCC) 05/16/2019   CAP (community acquired pneumonia) 05/16/2019   Acute on chronic systolic CHF (congestive heart failure) (HCC) 05/16/2019   HLD (hyperlipidemia) 05/16/2019   Depression 05/16/2019   Lower limb ulcer, calf, left, limited to breakdown of skin (HCC) 05/06/2019   Swelling of limb 05/06/2019   CAD (coronary artery disease) 08/07/2018   Erythema of lower extremity 08/07/2018   Mild protein-calorie malnutrition (HCC) 06/18/2018   Acute respiratory distress  06/12/2018   Dyspnea 06/12/2018   Altered mental status 08/19/2017   Headache 08/19/2017   Tinnitus 08/19/2017   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HCC) 05/02/2017   Unsteadiness 04/12/2017   Lung nodule 03/11/2017   Nausea 12/18/2016   Diabetic foot ulcer (HCC) 11/13/2016   Leg pain 05/07/2016   Acute on chronic combined systolic and diastolic heart failure (HCC) 05/06/2016   Atrial fibrillation with rapid ventricular response (HCC) 05/06/2016   High risk medication use 11/23/2015   Dysphagia 10/02/2015   Loss of memory 04/29/2015   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   Venous insufficiency of both lower extremities 12/17/2014   Bilateral lower extremity edema 11/23/2014   Nocturia 10/17/2014   History of urinary retention 10/17/2014   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   B12 deficiency 09/30/2014   Right leg swelling 09/30/2014   Anemia 09/16/2014   Thrombocytopenia (HCC) 09/16/2014   Benign essential HTN 06/08/2014   Combined fat and carbohydrate induced hyperlipemia 06/08/2014   Beat, premature ventricular 12/03/2013   Heart valve disease 12/03/2013   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   Ventricular premature depolarization 12/03/2013   Benign fibroma of prostate 11/20/2013   Diabetes mellitus, type 2 (HCC) 11/20/2013   Acid reflux 11/20/2013   Anemia, iron deficiency 11/20/2013   Abnormal presence of protein in urine 11/20/2013   Peripheral sensory neuropathy 11/20/2013   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    Orientation RESPIRATION BLADDER Height & Weight     Self, Time  Normal Continent Weight: 93 kg Height:  6\' 2"  (188 cm)  BEHAVIORAL SYMPTOMS/MOOD NEUROLOGICAL BOWEL NUTRITION STATUS      Continent Diet  AMBULATORY STATUS COMMUNICATION OF NEEDS Skin   Extensive  Assist Verbally Surgical wounds                       Personal Care Assistance Level of Assistance  Bathing, Feeding, Dressing Bathing Assistance: Maximum assistance Feeding assistance:  Limited assistance Dressing Assistance: Maximum assistance     Functional Limitations Info             SPECIAL CARE FACTORS FREQUENCY  PT (By licensed PT), OT (By licensed OT)     PT Frequency: 5 times a week OT Frequency: 5 times a week            Contractures Contractures Info: Not present    Additional Factors Info  Code Status, Allergies Code Status Info: DNR Allergies Info: Hydrocodone-acetaminophen           Current Medications (11/12/2021):  This is the current hospital active medication list Current Facility-Administered Medications  Medication Dose Route Frequency Provider Last Rate Last Admin   acetaminophen (TYLENOL) tablet 650 mg  650 mg Oral Q6H PRN Mansy, Jan A, MD       Or   acetaminophen (TYLENOL) suppository 650 mg  650 mg Rectal Q6H PRN Mansy, Jan A, MD       albuterol (VENTOLIN HFA) 108 (90 Base) MCG/ACT inhaler 1-2 puff  1-2 puff Inhalation Q4H PRN Mansy, Jan A, MD       aspirin EC tablet 81 mg  81 mg Oral Daily Mansy, Jan A, MD   81 mg at 11/12/21 0835   bisacodyl (DULCOLAX) suppository 10 mg  10 mg Rectal Daily PRN Mansy, Jan A, MD       cyanocobalamin (VITAMIN B12) tablet 1,000 mcg  1,000 mcg Oral Daily Mansy, Jan A, MD   1,000 mcg at 11/12/21 0835   docusate sodium (COLACE) capsule 100 mg  100 mg Oral BID PRN Mansy, Jan A, MD       enoxaparin (LOVENOX) injection 40 mg  40 mg Subcutaneous Q24H Mansy, Jan A, MD   40 mg at 11/12/21 2831   ferrous sulfate tablet 325 mg  325 mg Oral Daily Mansy, Jan A, MD   325 mg at 11/12/21 0835   magnesium hydroxide (MILK OF MAGNESIA) suspension 30 mL  30 mL Oral Daily PRN Mansy, Jan A, MD       methocarbamol (ROBAXIN) tablet 500 mg  500 mg Oral Q6H PRN Mansy, Jan A, MD       metoprolol succinate (TOPROL-XL) 24 hr tablet 37.5 mg  37.5 mg Oral Daily Mansy, Jan A, MD   37.5 mg at 11/12/21 0834   metoprolol tartrate (LOPRESSOR) injection 5 mg  5 mg Intravenous Q4H PRN Esaw Grandchild A, DO       midodrine  (PROAMATINE) tablet 10 mg  10 mg Oral TID WC Mansy, Jan A, MD   10 mg at 11/12/21 0835   mometasone-formoterol (DULERA) 200-5 MCG/ACT inhaler 2 puff  2 puff Inhalation BID Mansy, Jan A, MD   2 puff at 11/12/21 0836   ondansetron (ZOFRAN) tablet 4 mg  4 mg Oral Q6H PRN Mansy, Jan A, MD       Or   ondansetron [facility]) injection 4 mg  4 mg Intravenous Q6H PRN Mansy, Jan A, MD       pantoprazole (PROTONIX) EC tablet 40 mg  40 mg Oral BID AC Mansy, Jan A, MD   40 mg at 11/12/21 0836   rosuvastatin (CRESTOR) tablet 10 mg  10 mg Oral Daily Mansy, Jan A,  MD   10 mg at 11/12/21 0835   senna (SENOKOT) tablet 8.6 mg  1 tablet Oral Daily Mansy, Jan A, MD   8.6 mg at 11/12/21 0835   sertraline (ZOLOFT) tablet 25 mg  25 mg Oral Daily Mansy, Jan A, MD   25 mg at 11/12/21 0835   simethicone (MYLICON) chewable tablet 80 mg  80 mg Oral QID PRN Mansy, Jan A, MD       traMADol Veatrice [place]) tablet 50 mg  50 mg Oral Q6H PRN Mansy, Jan A, MD   50 mg at 11/11/21 1431   traZODone (DESYREL) tablet 25 mg  25 mg Oral QHS PRN Mansy, Arvella Merles, MD         Discharge Medications: Please see discharge summary for a list of discharge medications.  Relevant Imaging Results:  Relevant Lab Results:   Additional Information SS# [id number]  Valente David, RN

## 2021-11-12 NOTE — Progress Notes (Signed)
Progress Note   Patient: Jeremy Herrera HQI:[id number] DOB: Oct 10, 1940 DOA: 11/10/2021     0 DOS: the patient was seen and examined on 11/12/2021   Brief hospital course: Jeremy Herrera is a 81 y.o. Caucasian male with medical history significant for CHF, type 2 diabetes mellitus, GERD, hypertension, dyslipidemia and BPH, who presented to the ED on 11/10/2021 from [facility]hab for evaluation after having an episode of unresponsiveness.  He reported feeling very weak all over, but no other symptoms.  Pt had been discharged to rehab on 11/04/2021 after having surgery for left hip fracture repair.  That admission was complicated by A-fib with RVR, hypotension issues and acute blood loss anemia that required vasopressor support and blood transfusion.  He was started on midodrine in order to minimize hypotensive episodes with attempts at HR control with metoprolol.    In the ED, patient was found to be in A-fib with RVR with HR in 110's for the most part.  Vitals also notable for tachypnea.  CXR showed resolving interstitial infiltrates compared to prior.  CT of head  showed no acute findings.  Patient was admitted for evaluation of unresponsive episode, with differential including stroke and syncope.  MRI brain was negative for stroke. Carotid duplex U/S showed no hemodynamically significant stenosis.   Echocardiogram with bubble study showed normal EF 60-65% with normal diastolic parameters and mild-mod MR.  9/22 - pt in A-fib RVR with HR varying from low 100's to 130's on telemetry.  Ongoing tachynpea.   Assessment and Plan: * Unresponsiveness Stroke ruled out.  Suspect transient hypotension in the setting of A-fib with RVR.  MRI brain showed no stroke. Unremarkable echo and carotid duplex U/S.   -Neurochecks- Brain MRI without contrast will be obtained. - Follow up PT/OT/SLP recommendations -Started on aspirin statin on admission. -- Telemetry - Check orthostatic vitals - Monitor for  hypoglycemia  Closed displaced intertrochanteric fracture of left femur (HCC) Recent.  Discharged to SNF/rehab on 9/15.  I reached out to [facility] about removing his staples as pt was scheduled in clinic for that today or tomorrow.  Atrial fibrillation with rapid ventricular response (HCC) BP stable but soft at times.   HR"s varying from low 100's to 130's. --Continue home Toprol-XL 37.5 mg  --IV metoprolol 5 mg 14h PRN for HR>110 - Cardiology consulted - recommend IV hydration and continue metoprolol as is with no other changes or evaluation recommended -- Start Cardizem or amiodarone drip if HR refractory to above, depending on BP --Monitor BP closely --Telemetry  Orthostatic hypotension Orthostatic vitals positive with PT today (9/22).  Suspect this caused unresponsive episode.  Soft BP's due to metoprolol for HR control, currently in A-fib RVR despite metoprolol. --On midodrine 10 mg TID --Daily orthostatic vitals --On IV fluids  GERD without esophagitis --Continue PPI  Dyslipidemia --Continue statin  Atrial fibrillation, chronic ([place]) See A-fib with RVR. S/p Watchman device.  COPD (chronic obstructive pulmonary disease) (HCC) Stable, not exacerbated. Continue Dulera, PRN albuterol Monitor closely.  Diastolic CHF, chronic (HCC) Appear euvolemic, compensated. Continue metoprolol Hold off home PRN oral Lasix given concern for hypovolemia contributing to rapid A-fib and orthostatic vitals.  Depression --Continue Zoloft  CAD (coronary artery disease) Stable, no active CP. Continue metoprolol, statin, ASA        Subjective: Pt seen awake sitting up in bed today.  He reports feeling better.  Does not appear dyspneic today like he did yesterday.  Continues to report feeling like he might pass out  when upright and had positive orthostatic vitals with therapy yesterday.  OT at bedside to work with him. No other acute complaints.     Physical Exam: Vitals:    11/11/21 1653 11/11/21 2005 11/12/21 0354 11/12/21 0830  BP: 119/75 (!) 127/92 120/87 115/85  Pulse: 97 88 100 (!) 103  Resp: 18 20 20 16   Temp: 97.6 F (36.4 C) (!) 97.5 F (36.4 C) 98.3 F (36.8 C) 97.8 F (36.6 C)  TempSrc:  Oral    SpO2: 97% 99% 99% 100%  Weight:      Height:       General exam: awake, alert, no acute distress HEENT: moist mucus membranes, hearing grossly normal  Respiratory system: CTAB, no wheezes, rales or rhonchi, normal respiratory effort. Cardiovascular system: RRR, no pedal edema.   Central nervous system: A&O x3. no gross focal neurologic deficits, normal speech Extremities: moves all, no edema, normal tone Skin: dry, intact, normal temperature Psychiatry: normal mood, congruent affect, judgement and insight appear normal   Data Reviewed:  Notable labs ----  normal BMP except Ca 8.7.  BNP normal 79.2, procal < 0.10.  CBC with Hbg 11.1 stable    Studies/Procedures: Carotid U/S negative for hemodynamically significant stenosis.   Echo normal EF, no shunt seen on bubble study   Family Communication: none at bedside, will attempt to call  Disposition: Status is: Observation The patient remains OBS appropriate and will d/c before 2 midnights.  Planned Discharge Destination: Skilled nursing facility    Time spent: 45 minutes  Author: Ezekiel Slocumb, DO 11/12/2021 12:11 PM  For on call review [email or web address].

## 2021-11-12 NOTE — Care Management Obs Status (Signed)
[facility] NOTIFICATION   Patient Details  Name: Jeremy Herrera MRN: [id number] Date of Birth: 04/19/1940   Medicare Observation Status Notification Given:  Yes    Valente David, RN 11/12/2021, 11:45 AM

## 2021-11-12 NOTE — Evaluation (Signed)
Occupational Therapy Evaluation Patient Details Name: Jeremy Herrera MRN: [id number] DOB: 1940/03/10 Today's Date: 11/12/2021   History of Present Illness Jeremy Herrera is an 81yoM who comes to [place] on 11/10/21 after syncope at facility, pt coming in from [facility] where pt was there for STR since 11/04/21 for rehab needs s/p IM fixation of Left hip fracture. While previously admitted pt had several days of labile BP and HR with transition from supine to not-supine. CHF, DM, GERD, HTN, HLD, falls.   Clinical Impression   Jeremy Herrera presents today with generalized weakness, limited endurance, impaired balance, dizziness, and moderate pain. Pt lives with his daughter and son-in-law in a single-story home, 1 STE, has been largely IND in ADL, with family and personal care attendant assisting with all IADL. Pt uses a rollator for ambulation, reports 2 falls in previous 12 months. Pt has a personal attendant 3 hrs/day, 2 day/wk, with whom pt travels into community for shopping, dining, etc. Earlier this month pt experienced a fall and hip fx, surgically repaired at [facility] on 9/5, with pt discharging to [facility] for rehabilitation, where he had been staying prior to current admission. During today's evaluation, pt is able to perform bed mobility with SUPV, demonstrates good balance sitting EOB while reporting mild dizziness with transfer from supine<sit. Pt requires Mod A to come up into standing with RW, demonstrates fair-poor standing balance and reports 5/10 pain in standing. Pt able to maintain standing balance for <1 minutes x 3 trials, stating that pain necessitates he return to sitting. Pt's BP in supine 136/97, dropping to 93/67 sitting EOB. MD and RN aware. Recommend ongoing OT during hospitalization, with return to SNF to continue with rehabilitation following hip fx.   Recommendations for follow up therapy are one component of a multi-disciplinary discharge planning process, led by the  attending physician.  Recommendations may be updated based on patient status, additional functional criteria and insurance authorization.   Follow Up Recommendations  Skilled nursing-short term rehab (<3 hours/day)    Assistance Recommended at Discharge Frequent or constant Supervision/Assistance  Patient can return home with the following A lot of help with walking and/or transfers;A lot of help with bathing/dressing/bathroom;Assistance with cooking/housework;Help with stairs or ramp for entrance    Functional Status Assessment  Patient has had a recent decline in their functional status and demonstrates the ability to make significant improvements in function in a reasonable and predictable amount of time.  Equipment Recommendations  Other (comment) (defer to next venue of care)    Recommendations for Other Services       Precautions / Restrictions Precautions Precautions: Fall Restrictions Weight Bearing Restrictions: Yes RUE Weight Bearing: Weight bearing as tolerated LLE Weight Bearing: Weight bearing as tolerated      Mobility Bed Mobility Overal bed mobility: Needs Assistance Bed Mobility: Supine to Sit, Sit to Supine     Supine to sit: Supervision Sit to supine: Supervision   General bed mobility comments: able to transition to<>from sitting without physical assistance; able to use UE pulling on headboard to assist in supine with moving towards HOB    Transfers Overall transfer level: Needs assistance Equipment used: Rolling walker (2 wheels) Transfers: Sit to/from Stand Sit to Stand: Min assist, Mod assist          Lateral/Scoot Transfers: Supervision General transfer comment: able to scoot in sitting towards HOB w/o assistance, Min-Mod A to come into standing with RW      Balance Overall balance assessment:  Needs assistance Sitting-balance support: Single extremity supported, Feet supported Sitting balance-Leahy Scale: Good Sitting balance - Comments:  no external support required to maintain sitting balance   Standing balance support: Bilateral upper extremity supported Standing balance-Leahy Scale: Fair Standing balance comment: able to maintain standing balance for < 1 minute, citing pain                           ADL either performed or assessed with clinical judgement   ADL Overall ADL's : Needs assistance/impaired                     Lower Body Dressing: Maximal assistance Lower Body Dressing Details (indicate cue type and reason): donning socks                     Vision         Perception     Praxis      Pertinent Vitals/Pain Pain Assessment Pain Assessment: 0-10 Pain Score: 5  Pain Location: L LE w/ standing Pain Descriptors / Indicators: Grimacing, Moaning Pain Intervention(s): Limited activity within patient's tolerance, Repositioned     Hand Dominance Right   Extremity/Trunk Assessment Upper Extremity Assessment Upper Extremity Assessment: Overall WFL for tasks assessed   Lower Extremity Assessment Lower Extremity Assessment: Generalized weakness       Communication Communication Communication: HOH   Cognition Arousal/Alertness: Awake/alert Behavior During Therapy: WFL for tasks assessed/performed Overall Cognitive Status: Within Functional Limits for tasks assessed                         Following Commands: Follows one step commands with increased time             General Comments       Exercises Other Exercises Other Exercises: Educ re: slow transitions, falls prevention, monitoring for dizziness/lightheadedness   Shoulder Instructions      Home Living Family/patient expects to be discharged to:: Private residence Living Arrangements: Children Available Help at Discharge: Family;Available PRN/intermittently Type of Home: House Home Access: Stairs to enter [employer] of Steps: 1 Entrance Stairs-Rails: None Home Layout: One  level         Bathroom Toilet: Standard Bathroom Accessibility: No   Home Equipment: Agricultural consultant (2 wheels);Rollator (4 wheels);Cane - single point   Additional Comments: has been at [facility] in week prior to admission, s/p hip fx      Prior Functioning/Environment Prior Level of Function : Needs assist             Mobility Comments: uses rollator for ambulation; 2 falls in previous year ADLs Comments: mostly IND in BADL, showers INDly but only when daughter is home; family, caregiver provides transportation and assistance with IADL        OT Problem List: Decreased strength;Decreased range of motion;Decreased activity tolerance;Impaired balance (sitting and/or standing);Pain      OT Treatment/Interventions: Self-care/ADL training;Therapeutic exercise;Energy conservation;DME and/or AE instruction;Therapeutic activities;Patient/family education;Balance training    OT Goals(Current goals can be found in the care plan section) Acute Rehab OT Goals Patient Stated Goal: to go home OT Goal Formulation: With patient Time For Goal Achievement: 11/26/21 Potential to Achieve Goals: Good  OT Frequency: Min 2X/week    Co-evaluation              AM-PAC OT "6 Clicks" Daily Activity     Outcome Measure Help from another person  eating meals?: None Help from another person taking care of personal grooming?: A Little Help from another person toileting, which includes using toliet, bedpan, or urinal?: A Lot Help from another person bathing (including washing, rinsing, drying)?: A Lot Help from another person to put on and taking off regular upper body clothing?: A Little Help from another person to put on and taking off regular lower body clothing?: A Lot 6 Click Score: 16   End of Session Equipment Utilized During Treatment: Rolling walker (2 wheels)  Activity Tolerance: Patient tolerated treatment well Patient left: in bed;with call bell/phone within reach;with bed  alarm set  OT Visit Diagnosis: Muscle weakness (generalized) (M62.81);Pain;Unsteadiness on feet (R26.81);Other abnormalities of gait and mobility (R26.89) Pain - Right/Left: Left Pain - part of body: Hip                Time: 5625-6389 OT Time Calculation (min): 43 min Charges:  OT General Charges $OT Visit: 1 Visit OT Evaluation $OT Eval Moderate Complexity: 1 Mod OT Treatments $Self Care/Home Management : 38-52 mins Josiah Lobo, PhD, MS, OTR/L 11/12/21, 11:33 AM

## 2021-11-12 NOTE — Consult Note (Signed)
[facility] Cardiology Consultation Note  Patient ID: Jeremy Herrera, MRN: [id number], DOB/AGE: 02/22/1940 81 y.o. Admit date: 11/10/2021   Date of Consult: 11/12/2021 Primary Physician: Sofie Hartigan, MD Primary Cardiologist: Nehemiah Massed  Chief Complaint:  Chief Complaint  Patient presents with   Near Syncope     femur fx some time ago was in [facility] for rehab. Staff called ems for possible unwittnessed syncopal event.    Reason for Consult: HPI     Near Syncope    Additional comments:  femur fx some time ago was in [facility] for rehab. Staff called ems for possible unwittnessed syncopal event.       Last edited by Marylee Floras, RN on 11/10/2021  3:10 PM.       HPI: 81 y.o. male with diabetes hypertension hyperlipidemia and chronic nonvalvular atrial fibrillation who was admitted to the hospital recently for hip fracture and need for surgery.  The patient had postoperative anemia requiring pressors as well as blood products.  The patient slowly recovered from that but did have episode of atrial fibrillation with rapid ventricular rate most consistent with situation and or concerns for anemia.  The patient was therefore discharged home on a dose of metoprolol for heart rate control which appears to be reasonable.  He had physical rehabilitation and then sat down for his meal and had an unresponsive episode which was likely secondary to the situation and/or hypotension with or without rapid heart rate.  The patient may have had slightly less hydration at the time as well.  Since then the patient has been admitted to the hospital with an EKG showing atrial fibrillation with a fairly rapid rate but only to about 110 bpm.  Currently he is 90 bpm with this medication management of metoprolol.  Previous telemetry has been reviewed with no evidence of sick sinus syndrome and heart rate averaging 100 to 110 bpm which is appropriate.  There has been no evidence of  congestive heart failure or acute coronary syndrome or anginal symptoms.  We have discussed with the patient that the patient will need heart rate control although we do not want further hypotension and may consider some continued hydration today.  Additionally will consider avoiding Lasix unless significant pulmonary edema occurs  Past Medical History:  Diagnosis Date   Anemia    BPH (benign prostatic hyperplasia)    CHF (congestive heart failure) (HCC)    Diabetes mellitus without complication (HCC)    Dysphagia    GERD (gastroesophageal reflux disease)    HLD (hyperlipidemia)    HTN (hypertension)    Irregular heart beat    Nocturia    Urinary retention       Surgical History:  Past Surgical History:  Procedure Laterality Date   AMPUTATION TOE Left 05/04/2017   Procedure: AMPUTATION TOE - third left toe;  Surgeon: Samara Deist, DPM;  Location: [facility] ORS;  Service: Podiatry;  Laterality: Left;   bladder patching  1964   HIP SURGERY Right 2014   INTRAMEDULLARY (IM) NAIL INTERTROCHANTERIC Left 10/25/2021   Procedure: INTRAMEDULLARY (IM) NAIL INTERTROCHANTERIC;  Surgeon: Leim Fabry, MD;  Location: [facility] ORS;  Service: Orthopedics;  Laterality: Left;   IRRIGATION AND DEBRIDEMENT FOOT Left 11/14/2016   Procedure: IRRIGATION AND DEBRIDEMENT FOOT;  Surgeon: Samara Deist, DPM;  Location: [facility] ORS;  Service: Podiatry;  Laterality: Left;   TRANSURETHRAL RESECTION OF PROSTATE       Home Meds: Prior to Admission medications   Medication Sig  Start Date End Date Taking? Authorizing Provider  aspirin 81 MG tablet Take 81 mg by mouth daily.   Yes [provider]  ferrous sulfate 325 (65 FE) MG tablet Take 325 mg by mouth daily.   Yes [provider]  Fluticasone-Salmeterol (ADVAIR) 250-50 MCG/DOSE AEPB Inhale 1 puff into the lungs in the morning and at bedtime.   Yes [provider]  methocarbamol (ROBAXIN) 500 MG tablet Take 1 tablet (500 mg total) by mouth every 6  (six) hours as needed for muscle spasms. 10/28/21  Yes Reche Dixon, PA-C  metoprolol succinate (TOPROL-XL) 25 MG 24 hr tablet Take 1.5 tablets (37.5 mg total) by mouth daily. 11/04/21  Yes Amin, Ankit Chirag, MD  midodrine (PROAMATINE) 10 MG tablet Take 1 tablet (10 mg total) by mouth 3 (three) times daily with meals. 11/04/21  Yes Amin, Jeanella Flattery, MD  oxyCODONE (OXY IR/ROXICODONE) 5 MG immediate release tablet Take 0.5-1 tablets (2.5-5 mg total) by mouth every 4 (four) hours as needed for moderate pain (pain score 4-6). 10/28/21  Yes Reche Dixon, PA-C  pantoprazole (PROTONIX) 40 MG tablet Take 1 tablet (40 mg total) by mouth 2 (two) times daily before a meal. 11/04/21  Yes Amin, Ankit Chirag, MD  rosuvastatin (CRESTOR) 10 MG tablet Take 1 tablet by mouth daily. 07/27/20 11/10/21 Yes [provider]  senna (SENOKOT) 8.6 MG TABS tablet Take 1 tablet (8.6 mg total) by mouth daily. 11/04/21  Yes Amin, Ankit Chirag, MD  sertraline (ZOLOFT) 25 MG tablet Take 25 mg by mouth daily.  08/31/18  Yes [provider]  traMADol (ULTRAM) 50 MG tablet Take 1 tablet (50 mg total) by mouth every 6 (six) hours as needed for moderate pain. 10/28/21  Yes Reche Dixon, PA-C  umeclidinium-vilanterol (ANORO ELLIPTA) 62.5-25 MCG/INH AEPB Inhale 1 puff into the lungs daily as needed. Takes daily 08/05/18  Yes [provider]  vitamin B-12 (CYANOCOBALAMIN) 1000 MCG tablet Take 1 tablet (1,000 mcg total) by mouth daily. 08/19/20  Yes Earlie Server, MD  albuterol (VENTOLIN HFA) 108 (90 Base) MCG/ACT inhaler Inhale 1-2 puffs into the lungs every 4 (four) hours as needed.  07/07/18   [provider]  bisacodyl (DULCOLAX) 10 MG suppository Place 1 suppository (10 mg total) rectally daily as needed for moderate constipation. 11/04/21   Amin, Jeanella Flattery, MD  docusate sodium (COLACE) 100 MG capsule Take 1 capsule (100 mg total) by mouth 2 (two) times daily as needed for mild constipation. 11/04/21   Amin, Jeanella Flattery,  MD  enoxaparin (LOVENOX) 40 MG/0.4ML injection Inject 0.4 mLs (40 mg total) into the skin at bedtime for 14 days. 11/04/21 11/18/21  Amin, Jeanella Flattery, MD  furosemide (LASIX) 40 MG tablet Take 1 tablet (40 mg total) by mouth daily as needed for fluid or edema. 11/04/21   Amin, Jeanella Flattery, MD  ondansetron (ZOFRAN) 4 MG tablet Take 1 tablet (4 mg total) by mouth every 6 (six) hours as needed for nausea. 10/28/21   Reche Dixon, PA-C  simethicone (MYLICON) 0000000 MG chewable tablet Chew 125 mg by mouth every 6 (six) hours as needed.     [provider]  Skin Protectants, Misc. (REMEDY PHYTOPLEX HYDRAGUARD) CREA Apply topically.    [provider]    Inpatient Medications:   aspirin EC  81 mg Oral Daily   cyanocobalamin  1,000 mcg Oral Daily   enoxaparin (LOVENOX) injection  40 mg Subcutaneous Q24H   ferrous sulfate  325 mg Oral Daily  metoprolol succinate  37.5 mg Oral Daily   midodrine  10 mg Oral TID WC   mometasone-formoterol  2 puff Inhalation BID   pantoprazole  40 mg Oral BID AC   rosuvastatin  10 mg Oral Daily   senna  1 tablet Oral Daily   sertraline  25 mg Oral Daily     Allergies:  Allergies  Allergen Reactions   Hydrocodone-Acetaminophen Nausea And Vomiting    Social History   Socioeconomic History   Marital status: Widowed    Spouse name: Not on file   Number of children: Not on file   Years of education: Not on file   Highest education level: Not on file  Occupational History   Not on file  Tobacco Use   Smoking status: Never   Smokeless tobacco: Never  Vaping Use   Vaping Use: Never used  Substance and Sexual Activity   Alcohol use: No   Drug use: No   Sexual activity: Not Currently  Other Topics Concern   Not on file  Social History Narrative   Not on file   Social Determinants of Health   Financial Resource Strain: Not on file  Food Insecurity: No Food Insecurity (11/01/2021)   Hunger Vital Sign    Worried About Running Out of Food in  the Last Year: Never true    Ran Out of Food in the Last Year: Never true  Transportation Needs: No Transportation Needs (11/01/2021)   PRAPARE - Hydrologist (Medical): No    Lack of Transportation (Non-Medical): No  Physical Activity: Not on file  Stress: Not on file  Social Connections: Not on file  Intimate Partner Violence: Not At Risk (11/01/2021)   Humiliation, Afraid, Rape, and Kick questionnaire    Fear of Current or Ex-Partner: No    Emotionally Abused: No    Physically Abused: No    Sexually Abused: No     Family History  Problem Relation Age of Onset   Ulcers Mother        stomach   GER disease Sister        GERD   Heart attack Father    Diabetes Brother    Depression Brother    Kidney disease Neg Hx    Prostate cancer Neg Hx    Kidney cancer Neg Hx    Bladder Cancer Neg Hx      Review of Systems Positive for syncope Negative for: General:  chills, fever, night sweats or weight changes.  Cardiovascular: PND orthopnea positive for syncope dizziness  Dermatological skin lesions rashes Respiratory: Cough congestion Urologic: Frequent urination urination at night and hematuria Abdominal: negative for nausea, vomiting, diarrhea, bright red blood per rectum, melena, or hematemesis Neurologic: negative for visual changes, and/or hearing changes  All other systems reviewed and are otherwise negative except as noted above.  Labs: No results for input(s): "CKTOTAL", "CKMB", "TROPONINI" in the last 72 hours. Lab Results  Component Value Date   WBC 5.8 11/12/2021   HGB 11.1 (L) 11/12/2021   HCT 33.2 (L) 11/12/2021   MCV 93.0 11/12/2021   PLT 212 11/12/2021    Recent Labs  Lab 11/10/21 1534 11/11/21 0914 11/12/21 0615  NA 140   < > 139  K 4.4   < > 3.9  CL 106   < > 107  CO2 24   < > 23  BUN 17   < > 17  CREATININE 0.94   < > 1.02  CALCIUM 9.2   < > 8.7*  PROT 6.9  --   --   BILITOT 1.9*  --   --   ALKPHOS 226*  --   --    ALT 30  --   --   AST 35  --   --   GLUCOSE 123*   < > 99   < > = values in this interval not displayed.   Lab Results  Component Value Date   CHOL 152 11/11/2021   HDL 34 (L) 11/11/2021   LDLCALC 99 11/11/2021   TRIG 94 11/11/2021   No results found for: "DDIMER"  Radiology/Studies:  ECHOCARDIOGRAM COMPLETE BUBBLE STUDY  Result Date: 11/11/2021    ECHOCARDIOGRAM REPORT   Patient Name:   Jeremy Herrera Date of Exam: 11/11/2021 Medical Rec #:  [id number]         Height:       74.0 in Accession #:    [id number]        Weight:       205.0 lb Date of Birth:  1940/05/22          BSA:          2.197 m Patient Age:    50 years          BP:           112/67 mmHg Patient Gender: M                 HR:           110 bpm. Exam Location:  [facility] Procedure: 2D Echo, Cardiac Doppler, Color Doppler and Saline Contrast Bubble            Study Indications:     Stroke 434.91 / I63.9  History:         Patient has prior history of Echocardiogram examinations, most                  recent 10/27/2021. CHF; Risk Factors:Hypertension and Diabetes.  Sonographer:     Sherrie Sport Referring Phys:  [id number] [place] Diagnosing Phys: Serafina Royals MD IMPRESSIONS  1. Left ventricular ejection fraction, by estimation, is 60 to 65%. The left ventricle has normal function. The left ventricle has no regional wall motion abnormalities. Left ventricular diastolic parameters were normal.  2. Right ventricular systolic function is normal. The right ventricular size is normal.  3. Left atrial size was moderately dilated.  4. The mitral valve is normal in structure. Mild to moderate mitral valve regurgitation.  5. The aortic valve is normal in structure. Aortic valve regurgitation is trivial. FINDINGS  Left Ventricle: Left ventricular ejection fraction, by estimation, is 60 to 65%. The left ventricle has normal function. The left ventricle has no regional wall motion abnormalities. The left ventricular internal cavity size was normal in  size. There is  no left ventricular hypertrophy. Left ventricular diastolic parameters were normal. Right Ventricle: The right ventricular size is normal. No increase in right ventricular wall thickness. Right ventricular systolic function is normal. Left Atrium: Left atrial size was moderately dilated. Right Atrium: Right atrial size was normal in size. Pericardium: There is no evidence of pericardial effusion. Mitral Valve: The mitral valve is normal in structure. Mild to moderate mitral valve regurgitation. Tricuspid Valve: The tricuspid valve is normal in structure. Tricuspid valve regurgitation is mild. Aortic Valve: The aortic valve is normal in structure. Aortic valve regurgitation is trivial. Aortic valve mean gradient measures 6.0 mmHg.  Aortic valve peak gradient measures 11.1 mmHg. Aortic valve area, by VTI measures 2.15 cm. Pulmonic Valve: The pulmonic valve was normal in structure. Pulmonic valve regurgitation is trivial. Aorta: The aortic root and ascending aorta are structurally normal, with no evidence of dilitation. IAS/Shunts: No atrial level shunt detected by color flow Doppler. Agitated saline contrast was given intravenously to evaluate for intracardiac shunting.  LEFT VENTRICLE PLAX 2D LVIDd:         4.40 cm   Diastology LVIDs:         3.20 cm   LV e' medial:    7.72 cm/s LV PW:         1.80 cm   LV E/e' medial:  11.5 LV IVS:        1.40 cm   LV e' lateral:   10.60 cm/s LVOT diam:     2.20 cm   LV E/e' lateral: 8.4 LV SV:         59 LV SV Index:   27 LVOT Area:     3.80 cm  RIGHT VENTRICLE RV Basal diam:  3.40 cm RV Mid diam:    3.90 cm RV S prime:     12.30 cm/s TAPSE (M-mode): 1.3 cm LEFT ATRIUM             Index        RIGHT ATRIUM           Index LA diam:        5.70 cm 2.60 cm/m   RA Area:     17.80 cm LA Vol (A2C):   94.7 ml 43.11 ml/m  RA Volume:   42.20 ml  19.21 ml/m LA Vol (A4C):   65.4 ml 29.77 ml/m LA Biplane Vol: 80.6 ml 36.69 ml/m  AORTIC VALVE AV Area (Vmax):    1.62 cm  AV Area (Vmean):   1.78 cm AV Area (VTI):     2.15 cm AV Vmax:           166.33 cm/s AV Vmean:          110.333 cm/s AV VTI:            0.274 m AV Peak Grad:      11.1 mmHg AV Mean Grad:      6.0 mmHg LVOT Vmax:         71.10 cm/s LVOT Vmean:        51.600 cm/s LVOT VTI:          0.155 m LVOT/AV VTI ratio: 0.57  AORTA Ao Root diam: 4.00 cm MITRAL VALVE               TRICUSPID VALVE MV Area (PHT): 4.29 cm    TR Peak grad:   28.7 mmHg MV Decel Time: 177 msec    TR Vmax:        268.00 cm/s MV E velocity: 89.10 cm/s                            SHUNTS                            Systemic VTI:  0.16 m                            Systemic Diam: 2.20 cm Serafina Royals MD Electronically signed by Serafina Royals MD  Signature Date/Time: 11/11/2021/3:13:05 PM    Final    US Carotid Bilateral  Result Date: 11/11/2021 CLINICAL DATA:  81 year old male with a history of syncope EXAM: BILATERAL CAROTID DUPLEX ULTRASOUND TECHNIQUE: Pearline Cables scale imaging, color Doppler and duplex ultrasound were performed of bilateral carotid and vertebral arteries in the neck. COMPARISON:  None Available. FINDINGS: Criteria: Quantification of carotid stenosis is based on velocity parameters that correlate the residual internal carotid diameter with NASCET-based stenosis levels, using the diameter of the distal internal carotid lumen as the denominator for stenosis measurement. The following velocity measurements were obtained: RIGHT ICA:  Systolic 88 cm/sec, Diastolic 21 cm/sec CCA:  85 cm/sec SYSTOLIC ICA/CCA RATIO:  1.0 ECA:  66 cm/sec LEFT ICA:  Systolic [id number] cm/sec, Diastolic 35 cm/sec CCA:  78 cm/sec SYSTOLIC ICA/CCA RATIO:  1.4 ECA:  133 cm/sec Right Brachial SBP: Not acquired Left Brachial SBP: Not acquired RIGHT CAROTID ARTERY: No significant calcifications of the right common carotid artery. Intermediate waveform maintained. Heterogeneous and partially calcified plaque at the right carotid bifurcation. No significant lumen shadowing. Low  resistance waveform of the right ICA. No significant tortuosity. RIGHT VERTEBRAL ARTERY: Antegrade flow with low resistance waveform. LEFT CAROTID ARTERY: No significant calcifications of the left common carotid artery. Intermediate waveform maintained. Heterogeneous and partially calcified plaque at the left carotid bifurcation without significant lumen shadowing. Low resistance waveform of the left ICA. No significant tortuosity. LEFT VERTEBRAL ARTERY:  Antegrade flow with low resistance waveform. Irregular rhythm identified during the exam IMPRESSION: Color duplex indicates minimal heterogeneous and calcified plaque, with no hemodynamically significant stenosis by duplex criteria in the extracranial cerebrovascular circulation. Irregular cardiac rhythm identified during the exam. Correlation with any history of atrial fibrillation and/or EKG may be useful Signed, Dulcy Fanny. Nadene Rubins, RPVI [facility] Rad[facility] Electronically Signed   By: Corrie Mckusick D.O.   On: 11/11/2021 13:50   MR BRAIN WO CONTRAST  Result Date: 11/10/2021 CLINICAL DATA:  Follow-up examination for stroke. EXAM: MRI HEAD WITHOUT CONTRAST TECHNIQUE: Multiplanar, multiecho pulse sequences of the brain and surrounding structures were obtained without intravenous contrast. COMPARISON:  Prior CT from earlier the same day. FINDINGS: Brain: Mild age-related cerebral atrophy. Patchy T2/FLAIR hyperintensity involving the periventricular deep white matter both cerebral hemispheres as well as the pons, most consistent with chronic small vessel ischemic disease, mild to moderate in nature. No evidence for acute or subacute ischemia. Gray-white matter differentiation maintained. No areas of chronic cortical infarction. No acute intracranial hemorrhage. Single punctate chronic microhemorrhage noted within the left frontal lobe, of doubtful significance in isolation. No mass lesion, midline shift or  mass effect. No hydrocephalus or extra-axial fluid collection. Pituitary gland suprasellar region within normal limits. Vascular: Major intracranial vascular flow voids are maintained. Skull and upper cervical spine: Craniocervical junction within normal limits. Bone marrow signal intensity normal. No scalp soft tissue abnormality. Sinuses/Orbits: Prior bilateral ocular lens replacement. Paranasal sinuses are largely clear. No significant mastoid effusion. Other: None. IMPRESSION: 1. No acute intracranial abnormality. 2. Age-related cerebral atrophy with mild to moderate chronic small vessel ischemic disease. Electronically Signed   By: Jeannine Boga M.D.   On: 11/10/2021 21:37   DG Chest Portable 1 View  Result Date: 11/10/2021 CLINICAL DATA:  Altered mental status, syncope EXAM: PORTABLE CHEST 1 VIEW COMPARISON:  10/28/2021 FINDINGS: Transverse diameter of heart is increased. There is interval decrease in interstitial markings in both lungs. There is residual prominence of interstitial markings, more so on the  left side. No new focal infiltrates are seen. There is no significant pleural effusion or pneumothorax. Right hemidiaphragm is elevated. There is interposition of colon between liver and diaphragm. IMPRESSION: Cardiomegaly. There is partial clearing of interstitial infiltrates suggesting resolving CHF or resolving multifocal pneumonia. No new focal infiltrates are seen. Electronically Signed   By: Elmer Picker M.D.   On: 11/10/2021 16:36   CT HEAD WO CONTRAST (5MM)  Result Date: 11/10/2021 CLINICAL DATA:  Altered level of consciousness, confusion EXAM: CT HEAD WITHOUT CONTRAST TECHNIQUE: Contiguous axial images were obtained from the base of the skull through the vertex without intravenous contrast. RADIATION DOSE REDUCTION: This exam was performed according to the departmental dose-optimization program which includes automated exposure control, adjustment of the mA and/or kV according  to patient size and/or use of iterative reconstruction technique. COMPARISON:  03/31/2018 FINDINGS: Brain: Scattered hypodensities within the periventricular white matter and within the right basal ganglia are compatible with age-indeterminate small vessel ischemic changes. No other signs of acute infarct or hemorrhage. The lateral ventricles and midline structures are otherwise unremarkable. No acute extra-axial fluid collections. No mass effect. Vascular: Prominent atherosclerosis of the internal carotid arteries. No hyperdense vessel. Skull: Normal. Negative for fracture or focal lesion. Sinuses/Orbits: No acute finding. Other: None. IMPRESSION: 1. Age-indeterminate small vessel ischemic changes within the periventricular white matter and right basal ganglia. 2. Otherwise no acute infarct or hemorrhage. Electronically Signed   By: Randa Ngo M.D.   On: 11/10/2021 15:51   DG Chest Port 1 View  Result Date: 10/28/2021 CLINICAL DATA:  Shortness of breath, chest pain. EXAM: PORTABLE CHEST 1 VIEW COMPARISON:  October 25, 2021. FINDINGS: Stable cardiomegaly. Increased coarse reticular opacities are noted throughout both lungs concerning for edema or atypical inflammation. Elevated right hemidiaphragm is noted. Bony thorax is unremarkable. IMPRESSION: Increased bilateral lung opacities are noted concerning for edema or atypical inflammation. Electronically Signed   By: Marijo Conception M.D.   On: 10/28/2021 08:31   CT ABDOMEN PELVIS WO CONTRAST  Result Date: 10/27/2021 CLINICAL DATA:  Retroperitoneal bleed suspected eval for retroperitoneal bleed EXAM: CT ABDOMEN AND PELVIS WITHOUT CONTRAST TECHNIQUE: Multidetector CT imaging of the abdomen and pelvis was performed following the standard protocol without IV contrast. RADIATION DOSE REDUCTION: This exam was performed according to the departmental dose-optimization program which includes automated exposure control, adjustment of the mA and/or kV according to  patient size and/or use of iterative reconstruction technique. COMPARISON:  CT abdomen pelvis 12/10/2011 trauma x-ray left hip 10/25/2021 FINDINGS: Lower chest: Peribronchovascular consolidation within the visualized lung bases. Peripheral reticulations noted. Left heart Amplatzer device noted. Aortic vascular calcifications. At least 3 vessel coronary artery calcification. Cardiac findings suggestive of anemia trace left pleural effusion. Hepatobiliary: No focal liver abnormality. No gallstones, gallbladder wall thickening, or pericholecystic fluid. No biliary dilatation. Pancreas: Diffusely atrophic. No focal lesion. Otherwise normal pancreatic contour. No surrounding inflammatory changes. No main pancreatic ductal dilatation. Spleen: Normal in size without focal abnormality. Adrenals/Urinary Tract: No adrenal nodule bilaterally. Bilateral kidneys enhance symmetrically. No hydronephrosis. No hydroureter. The urinary bladder is unremarkable. Stomach/Bowel: Stomach is within normal limits. No evidence of bowel wall thickening or dilatation. Colonic diverticulosis. The appendix is not definitely identified with no inflammatory changes in the right lower quadrant to suggest acute appendicitis. Vascular/Lymphatic: No abdominal aorta or iliac aneurysm. Moderate to severe atherosclerotic plaque of the aorta and its branches. No abdominal, pelvic, or inguinal lymphadenopathy. Reproductive: Not visualized due to streak artifact originating from bilateral femoral surgical  hardware. Other: No intraperitoneal free fluid. No intraperitoneal free gas. No organized fluid collection. No retroperitoneal hematoma formation. Musculoskeletal: No abdominal wall hernia or abnormality. Subcutaneus soft tissue edema and emphysema along the left hip. Left hip 2.5 x 1.8 cm subcutaneus soft tissue hematoma formation. Overlying skin staples. Atrophic degeneration of the right iliopsoas muscle. No suspicious lytic or blastic osseous lesions.  No acute displaced fracture. Multilevel degenerative changes of the spine. Total right hip arthroplasty partially visualized. Proximal femur fracture status post intramedullary fixation partially visualized. IMPRESSION: 1. No retroperitoneal hematoma. 2. Colonic diverticulosis with no acute diverticulitis. 3. Cardiac findings suggestive of anemia. 4. Peribronchovascular consolidation within the visualized lung bases suggestive of acute infection/inflammation. Peripheral reticulations likely representing underlying fibrosis. 5. Trace left pleural effusion. 6.  Aortic Atherosclerosis (ICD10-I70.0). Electronically Signed   By: Iven Finn M.D.   On: 10/27/2021 18:09   ECHOCARDIOGRAM COMPLETE  Result Date: 10/27/2021    ECHOCARDIOGRAM REPORT   Patient Name:   Jeremy Herrera Date of Exam: 10/27/2021 Medical Rec #:  [id number]         Height:       73.0 in Accession #:    [id number]        Weight:       215.6 lb Date of Birth:  [birth year]          BSA:          2.221 m Patient Age:    86 years          BP:           71/61 mmHg Patient Gender: M                 HR:           117 bpm. Exam Location:  [facility] Procedure: 2D Echo, Cardiac Doppler and Color Doppler Indications:     Atrial Fibrillation I 48.91  History:         Patient has prior history of Echocardiogram examinations, most                  recent 05/17/2019. CHF; Risk Factors:Diabetes, Hypertension and                  Dyslipidemia.  Sonographer:     Sherrie Sport Referring Phys:  [id number] Emeterio Reeve Diagnosing Phys: Ida Rogue MD  Sonographer Comments: Suboptimal apical window and Technically challenging study due to limited acoustic windows. IMPRESSIONS  1. Left ventricular ejection fraction, by estimation, is 60 to 65%. The left ventricle has normal function. The left ventricle has no regional wall motion abnormalities. Left ventricular diastolic parameters are indeterminate.  2. Right ventricular systolic function is normal. The right ventricular  size is normal. There is normal pulmonary artery systolic pressure. The estimated right ventricular systolic pressure is 123456 mmHg.  3. Left atrial size was severely dilated.  4. The mitral valve is normal in structure. No evidence of mitral valve regurgitation. No evidence of mitral stenosis.  5. The aortic valve was not well visualized. There is moderate calcification of the aortic valve. Aortic valve regurgitation is not visualized. Aortic valve sclerosis is present, with no evidence of aortic valve stenosis. Aortic valve area, by VTI measures 1.29 cm. Aortic valve mean gradient measures 6.3 mmHg. Aortic valve Vmax measures 1.69 m/s.  6. The inferior vena cava is normal in size with greater than 50% respiratory variability, suggesting right atrial pressure of 3 mmHg. FINDINGS  Left Ventricle: Left ventricular ejection  fraction, by estimation, is 60 to 65%. The left ventricle has normal function. The left ventricle has no regional wall motion abnormalities. The left ventricular internal cavity size was normal in size. There is  no left ventricular hypertrophy. Left ventricular diastolic parameters are indeterminate. Right Ventricle: The right ventricular size is normal. No increase in right ventricular wall thickness. Right ventricular systolic function is normal. There is normal pulmonary artery systolic pressure. The tricuspid regurgitant velocity is 1.78 m/s, and  with an assumed right atrial pressure of 5 mmHg, the estimated right ventricular systolic pressure is 123456 mmHg. Left Atrium: Left atrial size was severely dilated. Right Atrium: Right atrial size was normal in size. Pericardium: There is no evidence of pericardial effusion. Mitral Valve: The mitral valve is normal in structure. No evidence of mitral valve regurgitation. No evidence of mitral valve stenosis. Tricuspid Valve: The tricuspid valve is normal in structure. Tricuspid valve regurgitation is mild . No evidence of tricuspid stenosis. Aortic  Valve: The aortic valve was not well visualized. There is moderate calcification of the aortic valve. Aortic valve regurgitation is not visualized. Aortic valve sclerosis is present, with no evidence of aortic valve stenosis. Aortic valve mean gradient measures 6.3 mmHg. Aortic valve peak gradient measures 11.4 mmHg. Aortic valve area, by VTI measures 1.29 cm. Pulmonic Valve: The pulmonic valve was normal in structure. Pulmonic valve regurgitation is not visualized. No evidence of pulmonic stenosis. Aorta: The aortic root is normal in size and structure. Venous: The inferior vena cava is normal in size with greater than 50% respiratory variability, suggesting right atrial pressure of 3 mmHg. IAS/Shunts: No atrial level shunt detected by color flow Doppler.  LEFT VENTRICLE PLAX 2D LVIDd:         5.00 cm LVIDs:         4.15 cm LV PW:         1.40 cm LV IVS:        1.10 cm LVOT diam:     2.00 cm LV SV:         40 LV SV Index:   18 LVOT Area:     3.14 cm  RIGHT VENTRICLE RV Basal diam:  4.10 cm RV S prime:     11.10 cm/s TAPSE (M-mode): 2.7 cm LEFT ATRIUM            Index        RIGHT ATRIUM           Index LA diam:      5.50 cm  2.48 cm/m   RA Area:     21.90 cm LA Vol (A2C): 104.0 ml 46.82 ml/m  RA Volume:   70.70 ml  31.83 ml/m LA Vol (A4C): 184.0 ml 82.83 ml/m  AORTIC VALVE AV Area (Vmax):    1.15 cm AV Area (Vmean):   1.05 cm AV Area (VTI):     1.29 cm AV Vmax:           169.00 cm/s AV Vmean:          117.667 cm/s AV VTI:            0.307 m AV Peak Grad:      11.4 mmHg AV Mean Grad:      6.3 mmHg LVOT Vmax:         62.00 cm/s LVOT Vmean:        39.400 cm/s LVOT VTI:          0.126 m LVOT/AV VTI ratio: 0.41  AORTA Ao Root diam: 3.60 cm MITRAL VALVE                TRICUSPID VALVE MV Area (PHT): 4.63 cm     TR Peak grad:   12.7 mmHg MV Decel Time: 164 msec     TR Vmax:        178.00 cm/s MV E velocity: 112.00 cm/s                             SHUNTS                             Systemic VTI:  0.13 m                              Systemic Diam: 2.00 cm Ida Rogue MD Electronically signed by Ida Rogue MD Signature Date/Time: 10/27/2021/4:19:09 PM    Final    DG HIP UNILAT WITH PELVIS 2-3 VIEWS LEFT  Result Date: 10/25/2021 CLINICAL DATA:  ORIF LEFT femur EXAM: DG HIP (WITH OR WITHOUT PELVIS) 2-3V LEFT COMPARISON:  10/25/2021 Fluoroscopy time: 3 minutes 47 seconds Dose: Not provided; please refer to operative records Images: 6 FINDINGS: Osseous demineralization. IM nail with 2 compression screws placed across an intertrochanteric fracture LEFT femur. Significant improvement in fracture fragment alignment since previous exam. Distal locking screw present. IMPRESSION: Post nailing of intertrochanteric fracture LEFT femur. Electronically Signed   By: Lavonia Dana M.D.   On: 10/25/2021 18:42   DG C-Arm 1-60 Min-No Report  Result Date: 10/25/2021 Fluoroscopy was utilized by the requesting physician.  No radiographic interpretation.   CT CHEST WO CONTRAST  Result Date: 10/25/2021 CLINICAL DATA:  Fall.  Pleural effusion. EXAM: CT CHEST WITHOUT CONTRAST TECHNIQUE: Multidetector CT imaging of the chest was performed following the standard protocol without IV contrast. RADIATION DOSE REDUCTION: This exam was performed according to the departmental dose-optimization program which includes automated exposure control, adjustment of the mA and/or kV according to patient size and/or use of iterative reconstruction technique. COMPARISON:  Radiograph of same day.  CT scan of Jul 04, 2017. FINDINGS: Cardiovascular: Atherosclerosis of thoracic aorta is noted without aneurysm formation. Aortic valves calcifications are noted. Normal cardiac size. No pericardial effusion. Mild coronary artery calcifications are noted. Mediastinum/Nodes: No enlarged mediastinal or axillary lymph nodes. Thyroid gland, trachea, and esophagus demonstrate no significant findings. Lungs/Pleura: No pneumothorax or pleural effusion is noted. Reticular  densities are noted peripherally in both lungs suggesting chronic interstitial lung disease. Interval development of 6 mm nodule seen in left lower lobe best seen on image number 121 of series 3. Multiple patchy airspace opacities are noted in the right lung which may represent multifocal inflammation, or potentially severe fibrosis secondary to chronic scarring or inflammation. Some degree of mosaic pattern is seen in the left lung suggesting multifocal inflammation or air trapping secondary to small airways disease. Some degree of bronchiectasis is noted in both lower lobes. Upper Abdomen: No acute abnormality. Musculoskeletal: No chest wall mass or suspicious bone lesions identified. IMPRESSION: There is interval development of multiple patchy airspace opacities in the right lung which may represent multifocal inflammation or pneumonia, or potentially severe masslike fibrosis secondary to chronic scarring or inflammation. Some degree of mosaic pattern is also noted in the left lung suggesting multifocal inflammation or air trapping secondary to small airways disease. There  are again noted reticular densities noted peripherally throughout both lungs suggesting chronic interstitial lung disease, as well as some degree of bronchiectasis seen in both lower lobes, most consistent with chronic findings. Interval development of small nodule seen in left lower lobe. Non-contrast chest CT at 6-12 months is recommended. If the nodule is stable at time of repeat CT, then future CT at 18-24 months (from today's scan) is considered optional for low-risk patients, but is recommended for high-risk patients. This recommendation follows the consensus statement: Guidelines for Management of Incidental Pulmonary Nodules Detected on CT Images: From the Fleischner Society 2017; Radiology 2017; 284:228-243. Mild coronary artery calcifications are noted. Aortic valve calcifications are noted. Aortic Atherosclerosis (ICD10-I70.0).  Electronically Signed   By: Marijo Conception M.D.   On: 10/25/2021 14:56   DG Chest 1 View  Result Date: 10/25/2021 CLINICAL DATA:  Fall EXAM: CHEST  1 VIEW COMPARISON:  Radiograph 05/16/2019 FINDINGS: Enlarged cardiac silhouette. Appendage occlusion device noted. There is pulmonary vascular congestion with interstitial prominent bilaterally, left greater than right. No pleural effusion. No pneumothorax. No acute osseous abnormality identified on frontal radiographs of the chest. IMPRESSION: Cardiomegaly with pulmonary vascular congestion and interstitial prominence bilaterally, left greater than right, which could reflect a degree of interstitial edema. Electronically Signed   By: Maurine Simmering M.D.   On: 10/25/2021 11:57   DG Hip Unilat With Pelvis 2-3 Views Left  Result Date: 10/25/2021 CLINICAL DATA:  Fall, pain EXAM: DG HIP (WITH OR WITHOUT PELVIS) 2-3V LEFT COMPARISON:  None Available. FINDINGS: Osteopenia. Comminuted, displaced and impacted intratrochanteric fractures of the left hip. Status post right hip total arthroplasty. No displaced fracture or dislocation of the bony pelvis. Nonobstructive pattern of overlying bowel gas. IMPRESSION: 1. Comminuted, displaced and impacted intratrochanteric fractures of the left hip. 2. Status post right hip total arthroplasty. 3. No displaced fracture or dislocation of the bony pelvis. Electronically Signed   By: Delanna Ahmadi M.D.   On: 10/25/2021 11:52    EKG: Atrial fibrillation rhythm rapid ventricular rate nonspecific ST changes  Weights: Filed Weights   11/10/21 1519 11/11/21 0034  Weight: 98.6 kg 93 kg     Physical Exam: Blood pressure 120/87, pulse 100, temperature 98.3 F (36.8 C), resp. rate 20, height 6\' 2"  (1.88 m), weight 93 kg, SpO2 99 %. Body mass index is 26.32 kg/m. General: Well developed, well nourished, in no acute distress. Head eyes ears nose throat: Normocephalic, atraumatic, sclera non-icteric, no xanthomas, nares are without  discharge. No apparent thyromegaly and/or mass  Lungs: Normal respiratory effort.  no wheezes, no rales, no rhonchi.  Heart: Irregular with normal S1 S2.  2-3+ right upper sternal border murmur gallop, no rub, PMI is normal size and placement, carotid upstroke normal without bruit, jugular venous pressure is normal Abdomen: Soft, non-tender, non-distended with normoactive bowel sounds. No hepatomegaly. No rebound/guarding. No obvious abdominal masses. Abdominal aorta is normal size without bruit Extremities: Trace edema. no cyanosis, no clubbing, no ulcers  Peripheral : 2+ bilateral upper extremity pulses, 2+ bilateral femoral pulses, 2+ bilateral dorsal pedal pulse Neuro: Alert and oriented. No facial asymmetry. No focal deficit. Moves all extremities spontaneously. Musculoskeletal: Normal muscle tone without kyphosis Psych:  Responds to questions appropriately with a normal affect.    Assessment: 81 year old male with known chronic diastolic dysfunction congestive heart failure hypertension diabetes and chronic nonvalvular atrial fibrillation with relatively rapid rate status post hip surgery anemia and hypotension with a unresponsive spell most likely secondary to hypotension  rapid rate and significant physical activity with no evidence of congestive heart failure or acute coronary syndrome  Plan: 1.  Continuation of current dose of metoprolol at 37.5 mg due to good heart rate control without evidence of significant worsening tachycardia and or bradycardia and/or sick sinus syndrome 2.  Continue to follow hydration as a contributing factor to his episode and no use of furosemide at this time 3.  Begin ambulation and rehabilitation this weekend to reassess the potential for reoccurrence of above before discharge again watching closely for telemetry, hypotension, and overall weakness due to patient being somewhat bedridden for quite some time prior to discharge last week 4.  No further cardiac  diagnostics necessary at this time  Signed, Corey Skains M.D. [facility] 11/12/2021, 8:19 AM

## 2021-11-13 DIAGNOSIS — R4189 Other symptoms and signs involving cognitive functions and awareness: Secondary | ICD-10-CM | POA: Diagnosis not present

## 2021-11-13 MED ORDER — DIGOXIN 250 MCG PO TABS
0.2500 mg | ORAL_TABLET | Freq: Every day | ORAL | Status: DC
  Administered 2021-11-13 – 2021-11-16 (×4): 0.25 mg via ORAL
  Filled 2021-11-13 (×4): qty 1

## 2021-11-13 MED ORDER — ENSURE ENLIVE PO LIQD
237.0000 mL | Freq: Two times a day (BID) | ORAL | Status: DC
  Administered 2021-11-13 – 2021-11-16 (×7): 237 mL via ORAL

## 2021-11-13 MED ORDER — METOPROLOL SUCCINATE ER 25 MG PO TB24
12.5000 mg | ORAL_TABLET | Freq: Every day | ORAL | Status: DC
  Administered 2021-11-14 – 2021-11-16 (×3): 12.5 mg via ORAL
  Filled 2021-11-13 (×3): qty 1

## 2021-11-13 NOTE — Progress Notes (Signed)
[facility] Encounter Note  Patient: Jeremy Herrera / Admit Date: 11/10/2021 / Date of Encounter: 11/13/2021, 8:36 AM   Subjective: Patient feels well today with no evidence of significant syncopal or dizziness spells over the last 24 hours.  The patient was hydrated which may have improved some of his condition.  In addition to that the patient has had 2 rehab yesterday without evidence of significant symptoms or issues.  Atrial fibrillation has a reasonable heart rate control with previous heart rate medication dosage.  The patient otherwise has had no evidence of congestive heart failure or angina  Review of Systems: Positive for: None Negative for: Vision change, hearing change, syncope, dizziness, nausea, vomiting,diarrhea, bloody stool, stomach pain, cough, congestion, diaphoresis, urinary frequency, urinary pain,skin lesions, skin rashes Others previously listed  Objective: Telemetry: Atrial fibrillation with controlled ventricular rate Physical Exam: Blood pressure 116/78, pulse 75, temperature 97.6 F (36.4 C), temperature source Oral, resp. rate (!) 28, height 6\' 2"  (1.88 m), weight 93 kg, SpO2 100 %. Body mass index is 26.32 kg/m. General: Well developed, well nourished, in no acute distress. Head: Normocephalic, atraumatic, sclera non-icteric, no xanthomas, nares are without discharge. Neck: No apparent masses Lungs: Normal respirations with no wheezes, no rhonchi, no rales , no crackles   Heart: irregular rate and rhythm, normal S1 S2, no murmur, no rub, no gallop, PMI is normal size and placement, carotid upstroke normal without bruit, jugular venous pressure normal Abdomen: Soft, non-tender, non-distended with normoactive bowel sounds. No hepatosplenomegaly. Abdominal aorta is normal size without bruit Extremities: Trace edema, no clubbing, no cyanosis, no ulcers,  Peripheral: 2+ radial, 2+ femoral, 2+ dorsal pedal pulses Neuro: Alert and oriented.  Moves all extremities spontaneously. Psych:  Responds to questions appropriately with a normal affect.   Intake/Output Summary (Last 24 hours) at 11/13/2021 0836 Last data filed at 11/13/2021 0500 Gross per 24 hour  Intake 2187.96 ml  Output 1200 ml  Net 987.96 ml    Inpatient Medications:   aspirin EC  81 mg Oral Daily   cyanocobalamin  1,000 mcg Oral Daily   enoxaparin (LOVENOX) injection  40 mg Subcutaneous Q24H   ferrous sulfate  325 mg Oral Daily   metoprolol succinate  37.5 mg Oral Daily   midodrine  10 mg Oral TID WC   mometasone-formoterol  2 puff Inhalation BID   pantoprazole  40 mg Oral BID AC   rosuvastatin  10 mg Oral Daily   senna  1 tablet Oral Daily   sertraline  25 mg Oral Daily   Infusions:   sodium chloride 75 mL/hr at 11/13/21 0500    Labs: Recent Labs    11/11/21 0914 11/12/21 0615  NA 139 139  K 3.9 3.9  CL 106 107  CO2 25 23  GLUCOSE 103* 99  BUN 13 17  CREATININE 0.95 1.02  CALCIUM 8.7* 8.7*  MG  --  1.9   Recent Labs    11/10/21 1534  AST 35  ALT 30  ALKPHOS 226*  BILITOT 1.9*  PROT 6.9  ALBUMIN 3.6   Recent Labs    11/11/21 0914 11/12/21 0615  WBC 5.7 5.8  HGB 11.5* 11.1*  HCT 35.7* 33.2*  MCV 94.7 93.0  PLT 241 212   No results for input(s): "CKTOTAL", "CKMB", "TROPONINI" in the last 72 hours. Invalid input(s): "POCBNP" No results for input(s): "HGBA1C" in the last 72 hours.   Weights: Filed Weights   11/10/21 1519 11/11/21 0034  Weight: 98.6  kg 93 kg     Radiology/Studies:  ECHOCARDIOGRAM COMPLETE BUBBLE STUDY  Result Date: 11/11/2021    ECHOCARDIOGRAM REPORT   Patient Name:   Jeremy Herrera Date of Exam: 11/11/2021 Medical Rec #:  [id number]         Height:       74.0 in Accession #:    [id number]        Weight:       205.0 lb Date of Birth:  24-Jul-1940          BSA:          2.197 m Patient Age:    81 years          BP:           112/67 mmHg Patient Gender: M                 HR:           110 bpm. Exam Location:   [facility] Procedure: 2D Echo, Cardiac Doppler, Color Doppler and Saline Contrast Bubble            Study Indications:     Stroke 434.91 / I63.9  History:         Patient has prior history of Echocardiogram examinations, most                  recent 10/27/2021. CHF; Risk Factors:Hypertension and Diabetes.  Sonographer:     Sherrie Sport Referring Phys:  [id number] Stanley Diagnosing Phys: Serafina Royals MD IMPRESSIONS  1. Left ventricular ejection fraction, by estimation, is 60 to 65%. The left ventricle has normal function. The left ventricle has no regional wall motion abnormalities. Left ventricular diastolic parameters were normal.  2. Right ventricular systolic function is normal. The right ventricular size is normal.  3. Left atrial size was moderately dilated.  4. The mitral valve is normal in structure. Mild to moderate mitral valve regurgitation.  5. The aortic valve is normal in structure. Aortic valve regurgitation is trivial. FINDINGS  Left Ventricle: Left ventricular ejection fraction, by estimation, is 60 to 65%. The left ventricle has normal function. The left ventricle has no regional wall motion abnormalities. The left ventricular internal cavity size was normal in size. There is  no left ventricular hypertrophy. Left ventricular diastolic parameters were normal. Right Ventricle: The right ventricular size is normal. No increase in right ventricular wall thickness. Right ventricular systolic function is normal. Left Atrium: Left atrial size was moderately dilated. Right Atrium: Right atrial size was normal in size. Pericardium: There is no evidence of pericardial effusion. Mitral Valve: The mitral valve is normal in structure. Mild to moderate mitral valve regurgitation. Tricuspid Valve: The tricuspid valve is normal in structure. Tricuspid valve regurgitation is mild. Aortic Valve: The aortic valve is normal in structure. Aortic valve regurgitation is trivial. Aortic valve mean gradient measures 6.0 mmHg.  Aortic valve peak gradient measures 11.1 mmHg. Aortic valve area, by VTI measures 2.15 cm. Pulmonic Valve: The pulmonic valve was normal in structure. Pulmonic valve regurgitation is trivial. Aorta: The aortic root and ascending aorta are structurally normal, with no evidence of dilitation. IAS/Shunts: No atrial level shunt detected by color flow Doppler. Agitated saline contrast was given intravenously to evaluate for intracardiac shunting.  LEFT VENTRICLE PLAX 2D LVIDd:         4.40 cm   Diastology LVIDs:         3.20 cm   LV e' medial:  7.72 cm/s LV PW:         1.80 cm   LV E/e' medial:  11.5 LV IVS:        1.40 cm   LV e' lateral:   10.60 cm/s LVOT diam:     2.20 cm   LV E/e' lateral: 8.4 LV SV:         59 LV SV Index:   27 LVOT Area:     3.80 cm  RIGHT VENTRICLE RV Basal diam:  3.40 cm RV Mid diam:    3.90 cm RV S prime:     12.30 cm/s TAPSE (M-mode): 1.3 cm LEFT ATRIUM             Index        RIGHT ATRIUM           Index LA diam:        5.70 cm 2.60 cm/m   RA Area:     17.80 cm LA Vol (A2C):   94.7 ml 43.11 ml/m  RA Volume:   42.20 ml  19.21 ml/m LA Vol (A4C):   65.4 ml 29.77 ml/m LA Biplane Vol: 80.6 ml 36.69 ml/m  AORTIC VALVE AV Area (Vmax):    1.62 cm AV Area (Vmean):   1.78 cm AV Area (VTI):     2.15 cm AV Vmax:           166.33 cm/s AV Vmean:          110.333 cm/s AV VTI:            0.274 m AV Peak Grad:      11.1 mmHg AV Mean Grad:      6.0 mmHg LVOT Vmax:         71.10 cm/s LVOT Vmean:        51.600 cm/s LVOT VTI:          0.155 m LVOT/AV VTI ratio: 0.57  AORTA Ao Root diam: 4.00 cm MITRAL VALVE               TRICUSPID VALVE MV Area (PHT): 4.29 cm    TR Peak grad:   28.7 mmHg MV Decel Time: 177 msec    TR Vmax:        268.00 cm/s MV E velocity: 89.10 cm/s                            SHUNTS                            Systemic VTI:  0.16 m                            Systemic Diam: 2.20 cm Serafina Royals MD Electronically signed by Serafina Royals MD Signature Date/Time: 11/11/2021/3:13:05 PM     Final    US Carotid Bilateral  Result Date: 11/11/2021 CLINICAL DATA:  81 year old male with a history of syncope EXAM: BILATERAL CAROTID DUPLEX ULTRASOUND TECHNIQUE: Pearline Cables scale imaging, color Doppler and duplex ultrasound were performed of bilateral carotid and vertebral arteries in the neck. COMPARISON:  None Available. FINDINGS: Criteria: Quantification of carotid stenosis is based on velocity parameters that correlate the residual internal carotid diameter with NASCET-based stenosis levels, using the diameter of the distal internal carotid lumen as the denominator for stenosis measurement. The following velocity measurements were obtained: RIGHT ICA:  Systolic 88 cm/sec, Diastolic 21 cm/sec CCA:  85 cm/sec SYSTOLIC ICA/CCA RATIO:  1.0 ECA:  66 cm/sec LEFT ICA:  Systolic [id number] cm/sec, Diastolic 35 cm/sec CCA:  78 cm/sec SYSTOLIC ICA/CCA RATIO:  1.4 ECA:  133 cm/sec Right Brachial SBP: Not acquired Left Brachial SBP: Not acquired RIGHT CAROTID ARTERY: No significant calcifications of the right common carotid artery. Intermediate waveform maintained. Heterogeneous and partially calcified plaque at the right carotid bifurcation. No significant lumen shadowing. Low resistance waveform of the right ICA. No significant tortuosity. RIGHT VERTEBRAL ARTERY: Antegrade flow with low resistance waveform. LEFT CAROTID ARTERY: No significant calcifications of the left common carotid artery. Intermediate waveform maintained. Heterogeneous and partially calcified plaque at the left carotid bifurcation without significant lumen shadowing. Low resistance waveform of the left ICA. No significant tortuosity. LEFT VERTEBRAL ARTERY:  Antegrade flow with low resistance waveform. Irregular rhythm identified during the exam IMPRESSION: Color duplex indicates minimal heterogeneous and calcified plaque, with no hemodynamically significant stenosis by duplex criteria in the extracranial cerebrovascular circulation. Irregular cardiac  rhythm identified during the exam. Correlation with any history of atrial fibrillation and/or EKG may be useful Signed, Dulcy Fanny. Nadene Rubins, RPVI [facility] Electronically Signed   By: Corrie Mckusick D.O.   On: 11/11/2021 13:50   MR BRAIN WO CONTRAST  Result Date: 11/10/2021 CLINICAL DATA:  Follow-up examination for stroke. EXAM: MRI HEAD WITHOUT CONTRAST TECHNIQUE: Multiplanar, multiecho pulse sequences of the brain and surrounding structures were obtained without intravenous contrast. COMPARISON:  Prior CT from earlier the same day. FINDINGS: Brain: Mild age-related cerebral atrophy. Patchy T2/FLAIR hyperintensity involving the periventricular deep white matter both cerebral hemispheres as well as the pons, most consistent with chronic small vessel ischemic disease, mild to moderate in nature. No evidence for acute or subacute ischemia. Gray-white matter differentiation maintained. No areas of chronic cortical infarction. No acute intracranial hemorrhage. Single punctate chronic microhemorrhage noted within the left frontal lobe, of doubtful significance in isolation. No mass lesion, midline shift or mass effect. No hydrocephalus or extra-axial fluid collection. Pituitary gland suprasellar region within normal limits. Vascular: Major intracranial vascular flow voids are maintained. Skull and upper cervical spine: Craniocervical junction within normal limits. Bone marrow signal intensity normal. No scalp soft tissue abnormality. Sinuses/Orbits: Prior bilateral ocular lens replacement. Paranasal sinuses are largely clear. No significant mastoid effusion. Other: None. IMPRESSION: 1. No acute intracranial abnormality. 2. Age-related cerebral atrophy with mild to moderate chronic small vessel ischemic disease. Electronically Signed   By: Jeannine Boga M.D.   On: 11/10/2021 21:37   DG Chest Portable 1 View  Result Date: 11/10/2021 CLINICAL  DATA:  Altered mental status, syncope EXAM: PORTABLE CHEST 1 VIEW COMPARISON:  10/28/2021 FINDINGS: Transverse diameter of heart is increased. There is interval decrease in interstitial markings in both lungs. There is residual prominence of interstitial markings, more so on the left side. No new focal infiltrates are seen. There is no significant pleural effusion or pneumothorax. Right hemidiaphragm is elevated. There is interposition of colon between liver and diaphragm. IMPRESSION: Cardiomegaly. There is partial clearing of interstitial infiltrates suggesting resolving CHF or resolving multifocal pneumonia. No new focal infiltrates are seen. Electronically Signed   By: Elmer Picker M.D.   On: 11/10/2021 16:36   CT HEAD WO CONTRAST (5MM)  Result Date: 11/10/2021 CLINICAL DATA:  Altered level of consciousness, confusion EXAM: CT HEAD WITHOUT CONTRAST TECHNIQUE: Contiguous axial images were obtained from the base of the skull through the  vertex without intravenous contrast. RADIATION DOSE REDUCTION: This exam was performed according to the departmental dose-optimization program which includes automated exposure control, adjustment of the mA and/or kV according to patient size and/or use of iterative reconstruction technique. COMPARISON:  03/31/2018 FINDINGS: Brain: Scattered hypodensities within the periventricular white matter and within the right basal ganglia are compatible with age-indeterminate small vessel ischemic changes. No other signs of acute infarct or hemorrhage. The lateral ventricles and midline structures are otherwise unremarkable. No acute extra-axial fluid collections. No mass effect. Vascular: Prominent atherosclerosis of the internal carotid arteries. No hyperdense vessel. Skull: Normal. Negative for fracture or focal lesion. Sinuses/Orbits: No acute finding. Other: None. IMPRESSION: 1. Age-indeterminate small vessel ischemic changes within the periventricular white matter and right  basal ganglia. 2. Otherwise no acute infarct or hemorrhage. Electronically Signed   By: Randa Ngo M.D.   On: 11/10/2021 15:51   DG Chest Port 1 View  Result Date: 10/28/2021 CLINICAL DATA:  Shortness of breath, chest pain. EXAM: PORTABLE CHEST 1 VIEW COMPARISON:  October 25, 2021. FINDINGS: Stable cardiomegaly. Increased coarse reticular opacities are noted throughout both lungs concerning for edema or atypical inflammation. Elevated right hemidiaphragm is noted. Bony thorax is unremarkable. IMPRESSION: Increased bilateral lung opacities are noted concerning for edema or atypical inflammation. Electronically Signed   By: Marijo Conception M.D.   On: 10/28/2021 08:31   CT ABDOMEN PELVIS WO CONTRAST  Result Date: 10/27/2021 CLINICAL DATA:  Retroperitoneal bleed suspected eval for retroperitoneal bleed EXAM: CT ABDOMEN AND PELVIS WITHOUT CONTRAST TECHNIQUE: Multidetector CT imaging of the abdomen and pelvis was performed following the standard protocol without IV contrast. RADIATION DOSE REDUCTION: This exam was performed according to the departmental dose-optimization program which includes automated exposure control, adjustment of the mA and/or kV according to patient size and/or use of iterative reconstruction technique. COMPARISON:  CT abdomen pelvis 12/10/2011 trauma x-ray left hip 10/25/2021 FINDINGS: Lower chest: Peribronchovascular consolidation within the visualized lung bases. Peripheral reticulations noted. Left heart Amplatzer device noted. Aortic vascular calcifications. At least 3 vessel coronary artery calcification. Cardiac findings suggestive of anemia trace left pleural effusion. Hepatobiliary: No focal liver abnormality. No gallstones, gallbladder wall thickening, or pericholecystic fluid. No biliary dilatation. Pancreas: Diffusely atrophic. No focal lesion. Otherwise normal pancreatic contour. No surrounding inflammatory changes. No main pancreatic ductal dilatation. Spleen: Normal in size  without focal abnormality. Adrenals/Urinary Tract: No adrenal nodule bilaterally. Bilateral kidneys enhance symmetrically. No hydronephrosis. No hydroureter. The urinary bladder is unremarkable. Stomach/Bowel: Stomach is within normal limits. No evidence of bowel wall thickening or dilatation. Colonic diverticulosis. The appendix is not definitely identified with no inflammatory changes in the right lower quadrant to suggest acute appendicitis. Vascular/Lymphatic: No abdominal aorta or iliac aneurysm. Moderate to severe atherosclerotic plaque of the aorta and its branches. No abdominal, pelvic, or inguinal lymphadenopathy. Reproductive: Not visualized due to streak artifact originating from bilateral femoral surgical hardware. Other: No intraperitoneal free fluid. No intraperitoneal free gas. No organized fluid collection. No retroperitoneal hematoma formation. Musculoskeletal: No abdominal wall hernia or abnormality. Subcutaneus soft tissue edema and emphysema along the left hip. Left hip 2.5 x 1.8 cm subcutaneus soft tissue hematoma formation. Overlying skin staples. Atrophic degeneration of the right iliopsoas muscle. No suspicious lytic or blastic osseous lesions. No acute displaced fracture. Multilevel degenerative changes of the spine. Total right hip arthroplasty partially visualized. Proximal femur fracture status post intramedullary fixation partially visualized. IMPRESSION: 1. No retroperitoneal hematoma. 2. Colonic diverticulosis with no acute diverticulitis. 3. Cardiac findings  suggestive of anemia. 4. Peribronchovascular consolidation within the visualized lung bases suggestive of acute infection/inflammation. Peripheral reticulations likely representing underlying fibrosis. 5. Trace left pleural effusion. 6.  Aortic Atherosclerosis (ICD10-I70.0). Electronically Signed   By: Iven Finn M.D.   On: 10/27/2021 18:09   ECHOCARDIOGRAM COMPLETE  Result Date: 10/27/2021    ECHOCARDIOGRAM REPORT    Patient Name:   Jeremy Herrera Date of Exam: 10/27/2021 Medical Rec #:  [id number]         Height:       73.0 in Accession #:    [id number]        Weight:       215.6 lb Date of Birth:  02-19-1941          BSA:          2.221 m Patient Age:    41 years          BP:           71/61 mmHg Patient Gender: M                 HR:           117 bpm. Exam Location:  [facility] Procedure: 2D Echo, Cardiac Doppler and Color Doppler Indications:     Atrial Fibrillation I 48.91  History:         Patient has prior history of Echocardiogram examinations, most                  recent 05/17/2019. CHF; Risk Factors:Diabetes, Hypertension and                  Dyslipidemia.  Sonographer:     Sherrie Sport Referring Phys:  [id number] Emeterio Reeve Diagnosing Phys: Ida Rogue MD  Sonographer Comments: Suboptimal apical window and Technically challenging study due to limited acoustic windows. IMPRESSIONS  1. Left ventricular ejection fraction, by estimation, is 60 to 65%. The left ventricle has normal function. The left ventricle has no regional wall motion abnormalities. Left ventricular diastolic parameters are indeterminate.  2. Right ventricular systolic function is normal. The right ventricular size is normal. There is normal pulmonary artery systolic pressure. The estimated right ventricular systolic pressure is 123456 mmHg.  3. Left atrial size was severely dilated.  4. The mitral valve is normal in structure. No evidence of mitral valve regurgitation. No evidence of mitral stenosis.  5. The aortic valve was not well visualized. There is moderate calcification of the aortic valve. Aortic valve regurgitation is not visualized. Aortic valve sclerosis is present, with no evidence of aortic valve stenosis. Aortic valve area, by VTI measures 1.29 cm. Aortic valve mean gradient measures 6.3 mmHg. Aortic valve Vmax measures 1.69 m/s.  6. The inferior vena cava is normal in size with greater than 50% respiratory variability, suggesting right  atrial pressure of 3 mmHg. FINDINGS  Left Ventricle: Left ventricular ejection fraction, by estimation, is 60 to 65%. The left ventricle has normal function. The left ventricle has no regional wall motion abnormalities. The left ventricular internal cavity size was normal in size. There is  no left ventricular hypertrophy. Left ventricular diastolic parameters are indeterminate. Right Ventricle: The right ventricular size is normal. No increase in right ventricular wall thickness. Right ventricular systolic function is normal. There is normal pulmonary artery systolic pressure. The tricuspid regurgitant velocity is 1.78 m/s, and  with an assumed right atrial pressure of 5 mmHg, the estimated right ventricular systolic pressure is 123456 mmHg. Left Atrium: Left  atrial size was severely dilated. Right Atrium: Right atrial size was normal in size. Pericardium: There is no evidence of pericardial effusion. Mitral Valve: The mitral valve is normal in structure. No evidence of mitral valve regurgitation. No evidence of mitral valve stenosis. Tricuspid Valve: The tricuspid valve is normal in structure. Tricuspid valve regurgitation is mild . No evidence of tricuspid stenosis. Aortic Valve: The aortic valve was not well visualized. There is moderate calcification of the aortic valve. Aortic valve regurgitation is not visualized. Aortic valve sclerosis is present, with no evidence of aortic valve stenosis. Aortic valve mean gradient measures 6.3 mmHg. Aortic valve peak gradient measures 11.4 mmHg. Aortic valve area, by VTI measures 1.29 cm. Pulmonic Valve: The pulmonic valve was normal in structure. Pulmonic valve regurgitation is not visualized. No evidence of pulmonic stenosis. Aorta: The aortic root is normal in size and structure. Venous: The inferior vena cava is normal in size with greater than 50% respiratory variability, suggesting right atrial pressure of 3 mmHg. IAS/Shunts: No atrial level shunt detected by color  flow Doppler.  LEFT VENTRICLE PLAX 2D LVIDd:         5.00 cm LVIDs:         4.15 cm LV PW:         1.40 cm LV IVS:        1.10 cm LVOT diam:     2.00 cm LV SV:         40 LV SV Index:   18 LVOT Area:     3.14 cm  RIGHT VENTRICLE RV Basal diam:  4.10 cm RV S prime:     11.10 cm/s TAPSE (M-mode): 2.7 cm LEFT ATRIUM            Index        RIGHT ATRIUM           Index LA diam:      5.50 cm  2.48 cm/m   RA Area:     21.90 cm LA Vol (A2C): 104.0 ml 46.82 ml/m  RA Volume:   70.70 ml  31.83 ml/m LA Vol (A4C): 184.0 ml 82.83 ml/m  AORTIC VALVE AV Area (Vmax):    1.15 cm AV Area (Vmean):   1.05 cm AV Area (VTI):     1.29 cm AV Vmax:           169.00 cm/s AV Vmean:          117.667 cm/s AV VTI:            0.307 m AV Peak Grad:      11.4 mmHg AV Mean Grad:      6.3 mmHg LVOT Vmax:         62.00 cm/s LVOT Vmean:        39.400 cm/s LVOT VTI:          0.126 m LVOT/AV VTI ratio: 0.41  AORTA Ao Root diam: 3.60 cm MITRAL VALVE                TRICUSPID VALVE MV Area (PHT): 4.63 cm     TR Peak grad:   12.7 mmHg MV Decel Time: 164 msec     TR Vmax:        178.00 cm/s MV E velocity: 112.00 cm/s                             SHUNTS  Systemic VTI:  0.13 m                             Systemic Diam: 2.00 cm Ida Rogue MD Electronically signed by Ida Rogue MD Signature Date/Time: 10/27/2021/4:19:09 PM    Final    DG HIP UNILAT WITH PELVIS 2-3 VIEWS LEFT  Result Date: 10/25/2021 CLINICAL DATA:  ORIF LEFT femur EXAM: DG HIP (WITH OR WITHOUT PELVIS) 2-3V LEFT COMPARISON:  10/25/2021 Fluoroscopy time: 3 minutes 47 seconds Dose: Not provided; please refer to operative records Images: 6 FINDINGS: Osseous demineralization. IM nail with 2 compression screws placed across an intertrochanteric fracture LEFT femur. Significant improvement in fracture fragment alignment since previous exam. Distal locking screw present. IMPRESSION: Post nailing of intertrochanteric fracture LEFT femur. Electronically Signed    By: Lavonia Dana M.D.   On: 10/25/2021 18:42   DG C-Arm 1-60 Min-No Report  Result Date: 10/25/2021 Fluoroscopy was utilized by the requesting physician.  No radiographic interpretation.   CT CHEST WO CONTRAST  Result Date: 10/25/2021 CLINICAL DATA:  Fall.  Pleural effusion. EXAM: CT CHEST WITHOUT CONTRAST TECHNIQUE: Multidetector CT imaging of the chest was performed following the standard protocol without IV contrast. RADIATION DOSE REDUCTION: This exam was performed according to the departmental dose-optimization program which includes automated exposure control, adjustment of the mA and/or kV according to patient size and/or use of iterative reconstruction technique. COMPARISON:  Radiograph of same day.  CT scan of Jul 04, 2017. FINDINGS: Cardiovascular: Atherosclerosis of thoracic aorta is noted without aneurysm formation. Aortic valves calcifications are noted. Normal cardiac size. No pericardial effusion. Mild coronary artery calcifications are noted. Mediastinum/Nodes: No enlarged mediastinal or axillary lymph nodes. Thyroid gland, trachea, and esophagus demonstrate no significant findings. Lungs/Pleura: No pneumothorax or pleural effusion is noted. Reticular densities are noted peripherally in both lungs suggesting chronic interstitial lung disease. Interval development of 6 mm nodule seen in left lower lobe best seen on image number 121 of series 3. Multiple patchy airspace opacities are noted in the right lung which may represent multifocal inflammation, or potentially severe fibrosis secondary to chronic scarring or inflammation. Some degree of mosaic pattern is seen in the left lung suggesting multifocal inflammation or air trapping secondary to small airways disease. Some degree of bronchiectasis is noted in both lower lobes. Upper Abdomen: No acute abnormality. Musculoskeletal: No chest wall mass or suspicious bone lesions identified. IMPRESSION: There is interval development of multiple patchy  airspace opacities in the right lung which may represent multifocal inflammation or pneumonia, or potentially severe masslike fibrosis secondary to chronic scarring or inflammation. Some degree of mosaic pattern is also noted in the left lung suggesting multifocal inflammation or air trapping secondary to small airways disease. There are again noted reticular densities noted peripherally throughout both lungs suggesting chronic interstitial lung disease, as well as some degree of bronchiectasis seen in both lower lobes, most consistent with chronic findings. Interval development of small nodule seen in left lower lobe. Non-contrast chest CT at 6-12 months is recommended. If the nodule is stable at time of repeat CT, then future CT at 18-24 months (from today's scan) is considered optional for low-risk patients, but is recommended for high-risk patients. This recommendation follows the consensus statement: Guidelines for Management of Incidental Pulmonary Nodules Detected on CT Images: From the Fleischner Society 2017; Radiology 2017; 284:228-243. Mild coronary artery calcifications are noted. Aortic valve calcifications are noted. Aortic Atherosclerosis (ICD10-I70.0). Electronically Signed  By: Marijo Conception M.D.   On: 10/25/2021 14:56   DG Chest 1 View  Result Date: 10/25/2021 CLINICAL DATA:  Fall EXAM: CHEST  1 VIEW COMPARISON:  Radiograph 05/16/2019 FINDINGS: Enlarged cardiac silhouette. Appendage occlusion device noted. There is pulmonary vascular congestion with interstitial prominent bilaterally, left greater than right. No pleural effusion. No pneumothorax. No acute osseous abnormality identified on frontal radiographs of the chest. IMPRESSION: Cardiomegaly with pulmonary vascular congestion and interstitial prominence bilaterally, left greater than right, which could reflect a degree of interstitial edema. Electronically Signed   By: Maurine Simmering M.D.   On: 10/25/2021 11:57   DG Hip Unilat With Pelvis  2-3 Views Left  Result Date: 10/25/2021 CLINICAL DATA:  Fall, pain EXAM: DG HIP (WITH OR WITHOUT PELVIS) 2-3V LEFT COMPARISON:  None Available. FINDINGS: Osteopenia. Comminuted, displaced and impacted intratrochanteric fractures of the left hip. Status post right hip total arthroplasty. No displaced fracture or dislocation of the bony pelvis. Nonobstructive pattern of overlying bowel gas. IMPRESSION: 1. Comminuted, displaced and impacted intratrochanteric fractures of the left hip. 2. Status post right hip total arthroplasty. 3. No displaced fracture or dislocation of the bony pelvis. Electronically Signed   By: Delanna Ahmadi M.D.   On: 10/25/2021 11:52     Assessment and Recommendation  81 y.o. male with known diabetes hypertension hyperlipidemia and chronic diastolic dysfunction congestive heart failure having a syncopal episode likely secondary to multiple factors but no current evidence of acute coronary syndrome or congestive heart failure.  Patient's heart rate control is reasonable and with hydration and discontinuation of Lasix the patient feels better with ambulation and rehabilitation 1.  Continuation aspirin for further risk reduction cardiovascular event but no evidence need for anticoagulation due to Watchman 2.  Continue metoprolol at current dose which is reasonable heart rate control as before 3.  Continue physical activity and rehabilitation following for any syncopal episodes which may be multifactorial in nature and dehydration. 4.  Avoid Lasix at this time due to possible dehydration and hypotension causing above 5.  Okay for discharged home if patient is ambulating well  Signed, Serafina Royals M.D. FACC

## 2021-11-13 NOTE — Progress Notes (Signed)
Progress Note   Patient: Jeremy Herrera [id number] DOB: Mar 05, 1940 DOA: 11/10/2021     1 DOS: the patient was seen and examined on 11/13/2021   Brief hospital course: DREYON SEBRING is a 81 y.o. Caucasian male with medical history significant for CHF, type 2 diabetes mellitus, GERD, hypertension, dyslipidemia and BPH, who presented to the ED on 11/10/2021 from [facility]hab for evaluation after having an episode of unresponsiveness.  He reported feeling very weak all over, but no other symptoms.  Pt had been discharged to rehab on 11/04/2021 after having surgery for left hip fracture repair.  That admission was complicated by A-fib with RVR, hypotension issues and acute blood loss anemia that required vasopressor support and blood transfusion.  He was started on midodrine in order to minimize hypotensive episodes with attempts at HR control with metoprolol.    In the ED, patient was found to be in A-fib with RVR with HR in 110's for the most part.  Vitals also notable for tachypnea.  CXR showed resolving interstitial infiltrates compared to prior.  CT of head  showed no acute findings.  Patient was admitted for evaluation of unresponsive episode, with differential including stroke and syncope.  MRI brain was negative for stroke. Carotid duplex U/S showed no hemodynamically significant stenosis.   Echocardiogram with bubble study showed normal EF 60-65% with normal diastolic parameters and mild-mod MR.  9/22 - pt in A-fib RVR with HR varying from low 100's to 130's on telemetry.  Ongoing tachynpea.   Assessment and Plan: * Unresponsiveness Stroke ruled out.  Suspect transient hypotension in the setting of A-fib with RVR.  MRI brain showed no stroke. Unremarkable echo and carotid duplex U/S.   -Neurochecks- Brain MRI without contrast will be obtained. - Follow up PT/OT/SLP recommendations -Started on aspirin statin on admission. -- Telemetry - Check orthostatic vitals - Monitor for  hypoglycemia  Closed displaced intertrochanteric fracture of left femur (HCC) Recent.  Discharged to SNF/rehab on 9/15.  I reached out to [facility] about removing his staples as pt was scheduled in clinic for that today or tomorrow.  Atrial fibrillation with rapid ventricular response (HCC) BP stable but soft at times.   9/24: HR 110-130 on monitor, remains hypotensive despite fluids and max midodrine dose --Add Digoxin 0.25 mg daily --Reduce Toprol-XL 37.5 >> 12.5 mg for now --IV metoprolol 5 mg 14h PRN for HR>110 - Cardiology consulted - recommend IV hydration and continue metoprolol as is with no other changes or evaluation recommended.   -- Start amiodarone drip if HR refractory to above --Monitor BP closely --Telemetry  Orthostatic hypotension Orthostatic vitals positive with PT today (9/22).  Suspect this caused unresponsive episode.  Soft BP's due to metoprolol for HR control, currently in A-fib RVR despite metoprolol. --On midodrine 10 mg TID --Daily orthostatic vitals --On IV fluids --TED hose  GERD without esophagitis --Continue PPI  Dyslipidemia --Continue statin  Atrial fibrillation, chronic ([place]) See A-fib with RVR. S/p Watchman device.  COPD (chronic obstructive pulmonary disease) (HCC) Stable, not exacerbated. Continue Dulera, PRN albuterol Monitor closely.  Diastolic CHF, chronic (HCC) Appear euvolemic, compensated. Continue metoprolol Hold off home PRN oral Lasix given concern for hypovolemia contributing to rapid A-fib and orthostatic vitals.  Depression --Continue Zoloft  CAD (coronary artery disease) Stable, no active CP. Continue metoprolol, statin, ASA        Subjective: Pt seen awake sitting up in bed today, finishing breakfast.  Says he drinks Ensures daily and asks for these here.  Feels he did a bit better with PT yesterday, his BP continues to drop when he is upright, but less significant yesterday.  Pt denies other acute complaints. No  acute events reported.    Physical Exam: Vitals:   11/13/21 0322 11/13/21 0738 11/13/21 1034 11/13/21 1501  BP: 129/84 116/78 94/61 122/81  Pulse: 100 75 96 81  Resp: 18 (!) 28 20 18   Temp: 97.6 F (36.4 C)  97.6 F (36.4 C) 97.6 F (36.4 C)  TempSrc: Oral  Oral   SpO2: 100% 100% 100% 100%  Weight:      Height:       General exam: awake, alert, no acute distress HEENT: moist mucus membranes, hearing grossly normal  Respiratory system: CTAB, no wheezes, rales or rhonchi, normal respiratory effort. Cardiovascular system: irregularly irregular, no pedal edema.   Central nervous system: A&O x3. no gross focal neurologic deficits, normal speech Extremities: moves all, no edema, normal tone Skin: dry, intact, normal temperature Psychiatry: normal mood, congruent affect, judgement and insight appear normal   Data Reviewed:  Notable labs ----  normal BMP except Ca 8.7.  BNP normal 79.2, procal < 0.10.  CBC with Hbg 11.1 stable    Studies/Procedures: Carotid U/S negative for hemodynamically significant stenosis.   Echo normal EF, no shunt seen on bubble study   Family Communication: attempted to update daughter by phone, got her voicemail but did not leave message for patient privacy reasons.  Disposition: Status is: Inpatient Remains inpatient appropriate because: ongoing orthostatic hypotension, symptomatic, on IV fluids and medications being adjust. Requires further close monitoring to prevent readmission, or increased morbidity or mortality.      Planned Discharge Destination: Skilled nursing facility    Time spent: 35 minutes  Author: Ezekiel Slocumb, DO 11/13/2021 3:18 PM  For on call review [email or web address].

## 2021-11-13 NOTE — Progress Notes (Signed)
Physical Therapy Treatment Patient Details Name: Jeremy Herrera MRN: [id number] DOB: March 01, 1940 Today's Date: 11/13/2021   History of Present Illness Jeremy Herrera is an 62yoM who comes to [facility] on 11/10/21 after syncope at facility, pt coming in from [facility] where pt was there for STR since 11/04/21 for rehab needs s/p IM fixation of Left hip fracture. While previously admitted pt had several days of labile BP and HR with transition from supine to not-supine. CHF, DM, GERD, HTN, HLD, falls.    PT Comments    Pt in bed, ready to try therapy session.  He is able to get to EOB with heavy use of rail but no physical assist.  Sitting balance good.  Pt able to sit about 8 minutes with supervsion and denies dizziness today but BP does drop some along with HR but HR does go up to 138 a-fib on telemetry monitor briefly when pt is asked to return to supine with inc respirations to 41.  Vitals settled with rest in supine. Supine 93/70 P 107 Sitting 82/62 P 93 Sitting after 3 minutes 88/62 P 92 Supine 111/70 P 92  Pt generally fatigued with activity and standing/OOB was not progressed due to presentation.  Pt will need SNF upon discharge.   Recommendations for follow up therapy are one component of a multi-disciplinary discharge planning process, led by the attending physician.  Recommendations may be updated based on patient status, additional functional criteria and insurance authorization.  Follow Up Recommendations  Skilled nursing-short term rehab (<3 hours/day)     Assistance Recommended at Discharge Frequent or constant Supervision/Assistance  Patient can return home with the following Two people to help with walking and/or transfers;Two people to help with bathing/dressing/bathroom;Help with stairs or ramp for entrance;Assist for transportation;A little help with bathing/dressing/bathroom   Equipment Recommendations  None recommended by PT    Recommendations for Other Services        Precautions / Restrictions       Mobility  Bed Mobility Overal bed mobility: Needs Assistance Bed Mobility: Supine to Sit, Sit to Supine     Supine to sit: Supervision, HOB elevated Sit to supine: Min assist        Transfers                   General transfer comment: standing deferrred due to BP and increased HR 138 in sitting and general fatigue    Ambulation/Gait                   Stairs             Wheelchair Mobility    Modified Rankin (Stroke Patients Only)       Balance Overall balance assessment: Needs assistance Sitting-balance support: Feet supported Sitting balance-Leahy Scale: Good                                      Cognition Arousal/Alertness: Awake/alert Behavior During Therapy: WFL for tasks assessed/performed Overall Cognitive Status: Within Functional Limits for tasks assessed                                 General Comments: Pt is alert and able to follow commands consistently throughout. very pleasant and hard working but severely deconditioned with poor response to minimal activity.  Exercises Other Exercises Other Exercises: sitting EOB about 8 minutes with supervision    General Comments        Pertinent Vitals/Pain Pain Assessment Pain Assessment: Faces Faces Pain Scale: Hurts a little bit    Home Living                          Prior Function            PT Goals (current goals can now be found in the care plan section) Progress towards PT goals: Not progressing toward goals - comment    Frequency    7X/week      PT Plan Current plan remains appropriate    Co-evaluation              AM-PAC PT "6 Clicks" Mobility   Outcome Measure  Help needed turning from your back to your side while in a flat bed without using bedrails?: A Little Help needed moving from lying on your back to sitting on the side of a flat bed without using  bedrails?: A Little Help needed moving to and from a bed to a chair (including a wheelchair)?: A Lot Help needed standing up from a chair using your arms (e.g., wheelchair or bedside chair)?: A Lot Help needed to walk in hospital room?: Total Help needed climbing 3-5 steps with a railing? : Total 6 Click Score: 12    End of Session Equipment Utilized During Treatment: Oxygen Activity Tolerance: Patient limited by fatigue;Treatment limited secondary to medical complications (Comment) Patient left: in bed;with call bell/phone within reach;with bed alarm set Nurse Communication: Mobility status PT Visit Diagnosis: Muscle weakness (generalized) (M62.81);Difficulty in walking, not elsewhere classified (R26.2);Unsteadiness on feet (R26.81);Other abnormalities of gait and mobility (R26.89);Dizziness and giddiness (R42);Other symptoms and signs involving the nervous system (R29.898) Pain - Right/Left: Left Pain - part of body: Ankle and joints of foot     Time: 9390-3009 PT Time Calculation (min) (ACUTE ONLY): 19 min  Charges:  $Therapeutic Activity: 8-22 mins                   Danielle Dess, PTA 11/13/21, 11:30 AM

## 2021-11-14 DIAGNOSIS — R4189 Other symptoms and signs involving cognitive functions and awareness: Secondary | ICD-10-CM | POA: Diagnosis not present

## 2021-11-14 LAB — BASIC METABOLIC PANEL
Anion gap: 3 — ABNORMAL LOW (ref 5–15)
BUN: 20 mg/dL (ref 8–23)
CO2: 25 mmol/L (ref 22–32)
Calcium: 8.5 mg/dL — ABNORMAL LOW (ref 8.9–10.3)
Chloride: 111 mmol/L (ref 98–111)
Creatinine, Ser: 0.88 mg/dL (ref 0.61–1.24)
GFR, Estimated: >60 mL/min (ref >60)
Glucose, Bld: 93 mg/dL (ref 70–99)
Potassium: 4.3 mmol/L (ref 3.5–5.1)
Sodium: 139 mmol/L (ref 135–145)

## 2021-11-14 LAB — MAGNESIUM: Magnesium: 1.8 mg/dL (ref 1.7–2.4)

## 2021-11-14 NOTE — Progress Notes (Signed)
[facility] Cardiology [facility] Encounter Note  Patient: Jeremy Herrera / Admit Date: 11/10/2021 / Date of Encounter: 11/14/2021, 8:13 AM   Subjective: The patient has felt well overnight and has had no evidence of issues of dizziness or other concerns.  The patient has had good meals and reasonable hydration at this point.  His heart rate is reasonably controlled with current medical regimen including beta-blocker and now additional digoxin.  There is no guarantee that additional amiodarone would help with heart rate control.  Overall studies will show average heart rate below 110 bpm carries lower risk of long-term issues.  The patient has had an attempt with physical therapy yesterday but it was aborted due to apparent lower blood pressure.  It would be appropriate to reassess today with digoxin aboard.  Discussion with the electrophysiology suggest that the patient could have ablation with pacemaker placement only as a last resort if above was significantly causing long-term concerns.  Although, further discussion of this with electrophysiology would occur as an outpatient  Review of Systems: Positive for: None Negative for: Vision change, hearing change, syncope, dizziness, nausea, vomiting,diarrhea, bloody stool, stomach pain, cough, congestion, diaphoresis, urinary frequency, urinary pain,skin lesions, skin rashes Others previously listed  Objective: Telemetry: Atrial fibrillation with controlled ventricular rate Physical Exam: Blood pressure (!) 134/93, pulse 97, temperature 98 F (36.7 C), temperature source Oral, resp. rate 20, height 6\' 2"  (1.88 m), weight 93 kg, SpO2 100 %. Body mass index is 26.32 kg/m. General: Well developed, well nourished, in no acute distress. Head: Normocephalic, atraumatic, sclera non-icteric, no xanthomas, nares are without discharge. Neck: No apparent masses Lungs: Normal respirations with no wheezes, no rhonchi, no rales , no crackles   Heart:  irregular rate and rhythm, normal S1 S2, no murmur, no rub, no gallop, PMI is normal size and placement, carotid upstroke normal without bruit, jugular venous pressure normal Abdomen: Soft, non-tender, non-distended with normoactive bowel sounds. No hepatosplenomegaly. Abdominal aorta is normal size without bruit Extremities: Trace edema, no clubbing, no cyanosis, no ulcers,  Peripheral: 2+ radial, 2+ femoral, 2+ dorsal pedal pulses Neuro: Alert and oriented. Moves all extremities spontaneously. Psych:  Responds to questions appropriately with a normal affect.   Intake/Output Summary (Last 24 hours) at 11/14/2021 0813 Last data filed at 11/14/2021 0500 Gross per 24 hour  Intake 2299.29 ml  Output 700 ml  Net 1599.29 ml     Inpatient Medications:   aspirin EC  81 mg Oral Daily   cyanocobalamin  1,000 mcg Oral Daily   digoxin  0.25 mg Oral Daily   enoxaparin (LOVENOX) injection  40 mg Subcutaneous Q24H   feeding supplement  237 mL Oral BID BM   ferrous sulfate  325 mg Oral Daily   metoprolol succinate  12.5 mg Oral Daily   midodrine  10 mg Oral TID WC   mometasone-formoterol  2 puff Inhalation BID   pantoprazole  40 mg Oral BID AC   rosuvastatin  10 mg Oral Daily   senna  1 tablet Oral Daily   sertraline  25 mg Oral Daily   Infusions:   sodium chloride 75 mL/hr at 11/14/21 0500    Labs: Recent Labs    11/12/21 0615 11/14/21 0432  NA 139 139  K 3.9 4.3  CL 107 111  CO2 23 25  GLUCOSE 99 93  BUN 17 20  CREATININE 1.02 0.88  CALCIUM 8.7* 8.5*  MG 1.9 1.8    No results for input(s): "AST", "ALT", "ALKPHOS", "  BILITOT", "PROT", "ALBUMIN" in the last 72 hours.  Recent Labs    11/11/21 0914 11/12/21 0615  WBC 5.7 5.8  HGB 11.5* 11.1*  HCT 35.7* 33.2*  MCV 94.7 93.0  PLT 241 212    No results for input(s): "CKTOTAL", "CKMB", "TROPONINI" in the last 72 hours. Invalid input(s): "POCBNP" No results for input(s): "HGBA1C" in the last 72 hours.   Weights: Filed  Weights   11/10/21 1519 11/11/21 0034  Weight: 98.6 kg 93 kg     Radiology/Studies:  ECHOCARDIOGRAM COMPLETE BUBBLE STUDY  Result Date: 11/11/2021    ECHOCARDIOGRAM REPORT   Patient Name:   Jeremy Herrera Date of Exam: 11/11/2021 Medical Rec #:  [id number]         Height:       74.0 in Accession #:    [id number]        Weight:       205.0 lb Date of Birth:  09-Jun-1940          BSA:          2.197 m Patient Age:    81 years          BP:           112/67 mmHg Patient Gender: M                 HR:           110 bpm. Exam Location:  [facility] Procedure: 2D Echo, Cardiac Doppler, Color Doppler and Saline Contrast Bubble            Study Indications:     Stroke 434.91 / I63.9  History:         Patient has prior history of Echocardiogram examinations, most                  recent 10/27/2021. CHF; Risk Factors:Hypertension and Diabetes.  Sonographer:     Sherrie Sport Referring Phys:  [id number] [place] Diagnosing Phys: Serafina Royals MD IMPRESSIONS  1. Left ventricular ejection fraction, by estimation, is 60 to 65%. The left ventricle has normal function. The left ventricle has no regional wall motion abnormalities. Left ventricular diastolic parameters were normal.  2. Right ventricular systolic function is normal. The right ventricular size is normal.  3. Left atrial size was moderately dilated.  4. The mitral valve is normal in structure. Mild to moderate mitral valve regurgitation.  5. The aortic valve is normal in structure. Aortic valve regurgitation is trivial. FINDINGS  Left Ventricle: Left ventricular ejection fraction, by estimation, is 60 to 65%. The left ventricle has normal function. The left ventricle has no regional wall motion abnormalities. The left ventricular internal cavity size was normal in size. There is  no left ventricular hypertrophy. Left ventricular diastolic parameters were normal. Right Ventricle: The right ventricular size is normal. No increase in right ventricular wall thickness. Right  ventricular systolic function is normal. Left Atrium: Left atrial size was moderately dilated. Right Atrium: Right atrial size was normal in size. Pericardium: There is no evidence of pericardial effusion. Mitral Valve: The mitral valve is normal in structure. Mild to moderate mitral valve regurgitation. Tricuspid Valve: The tricuspid valve is normal in structure. Tricuspid valve regurgitation is mild. Aortic Valve: The aortic valve is normal in structure. Aortic valve regurgitation is trivial. Aortic valve mean gradient measures 6.0 mmHg. Aortic valve peak gradient measures 11.1 mmHg. Aortic valve area, by VTI measures 2.15 cm. Pulmonic Valve: The pulmonic valve was  normal in structure. Pulmonic valve regurgitation is trivial. Aorta: The aortic root and ascending aorta are structurally normal, with no evidence of dilitation. IAS/Shunts: No atrial level shunt detected by color flow Doppler. Agitated saline contrast was given intravenously to evaluate for intracardiac shunting.  LEFT VENTRICLE PLAX 2D LVIDd:         4.40 cm   Diastology LVIDs:         3.20 cm   LV e' medial:    7.72 cm/s LV PW:         1.80 cm   LV E/e' medial:  11.5 LV IVS:        1.40 cm   LV e' lateral:   10.60 cm/s LVOT diam:     2.20 cm   LV E/e' lateral: 8.4 LV SV:         59 LV SV Index:   27 LVOT Area:     3.80 cm  RIGHT VENTRICLE RV Basal diam:  3.40 cm RV Mid diam:    3.90 cm RV S prime:     12.30 cm/s TAPSE (M-mode): 1.3 cm LEFT ATRIUM             Index        RIGHT ATRIUM           Index LA diam:        5.70 cm 2.60 cm/m   RA Area:     17.80 cm LA Vol (A2C):   94.7 ml 43.11 ml/m  RA Volume:   42.20 ml  19.21 ml/m LA Vol (A4C):   65.4 ml 29.77 ml/m LA Biplane Vol: 80.6 ml 36.69 ml/m  AORTIC VALVE AV Area (Vmax):    1.62 cm AV Area (Vmean):   1.78 cm AV Area (VTI):     2.15 cm AV Vmax:           166.33 cm/s AV Vmean:          110.333 cm/s AV VTI:            0.274 m AV Peak Grad:      11.1 mmHg AV Mean Grad:      6.0 mmHg LVOT  Vmax:         71.10 cm/s LVOT Vmean:        51.600 cm/s LVOT VTI:          0.155 m LVOT/AV VTI ratio: 0.57  AORTA Ao Root diam: 4.00 cm MITRAL VALVE               TRICUSPID VALVE MV Area (PHT): 4.29 cm    TR Peak grad:   28.7 mmHg MV Decel Time: 177 msec    TR Vmax:        268.00 cm/s MV E velocity: 89.10 cm/s                            SHUNTS                            Systemic VTI:  0.16 m                            Systemic Diam: 2.20 cm Serafina Royals MD Electronically signed by Serafina Royals MD Signature Date/Time: 11/11/2021/3:13:05 PM    Final    US Carotid Bilateral  Result Date: 11/11/2021 CLINICAL DATA:  81 year old male with a history of syncope EXAM: BILATERAL CAROTID DUPLEX ULTRASOUND TECHNIQUE: Pearline Cables scale imaging, color Doppler and duplex ultrasound were performed of bilateral carotid and vertebral arteries in the neck. COMPARISON:  None Available. FINDINGS: Criteria: Quantification of carotid stenosis is based on velocity parameters that correlate the residual internal carotid diameter with NASCET-based stenosis levels, using the diameter of the distal internal carotid lumen as the denominator for stenosis measurement. The following velocity measurements were obtained: RIGHT ICA:  Systolic 88 cm/sec, Diastolic 21 cm/sec CCA:  85 cm/sec SYSTOLIC ICA/CCA RATIO:  1.0 ECA:  66 cm/sec LEFT ICA:  Systolic [id number] cm/sec, Diastolic 35 cm/sec CCA:  78 cm/sec SYSTOLIC ICA/CCA RATIO:  1.4 ECA:  133 cm/sec Right Brachial SBP: Not acquired Left Brachial SBP: Not acquired RIGHT CAROTID ARTERY: No significant calcifications of the right common carotid artery. Intermediate waveform maintained. Heterogeneous and partially calcified plaque at the right carotid bifurcation. No significant lumen shadowing. Low resistance waveform of the right ICA. No significant tortuosity. RIGHT VERTEBRAL ARTERY: Antegrade flow with low resistance waveform. LEFT CAROTID ARTERY: No significant calcifications of the left common carotid  artery. Intermediate waveform maintained. Heterogeneous and partially calcified plaque at the left carotid bifurcation without significant lumen shadowing. Low resistance waveform of the left ICA. No significant tortuosity. LEFT VERTEBRAL ARTERY:  Antegrade flow with low resistance waveform. Irregular rhythm identified during the exam IMPRESSION: Color duplex indicates minimal heterogeneous and calcified plaque, with no hemodynamically significant stenosis by duplex criteria in the extracranial cerebrovascular circulation. Irregular cardiac rhythm identified during the exam. Correlation with any history of atrial fibrillation and/or EKG may be useful Signed, Dulcy Fanny. Nadene Rubins, RPVI [facility] Radiology Electronically Signed   By: Corrie Mckusick D.O.   On: 11/11/2021 13:50   MR BRAIN WO CONTRAST  Result Date: 11/10/2021 CLINICAL DATA:  Follow-up examination for stroke. EXAM: MRI HEAD WITHOUT CONTRAST TECHNIQUE: Multiplanar, multiecho pulse sequences of the brain and surrounding structures were obtained without intravenous contrast. COMPARISON:  Prior CT from earlier the same day. FINDINGS: Brain: Mild age-related cerebral atrophy. Patchy T2/FLAIR hyperintensity involving the periventricular deep white matter both cerebral hemispheres as well as the pons, most consistent with chronic small vessel ischemic disease, mild to moderate in nature. No evidence for acute or subacute ischemia. Gray-white matter differentiation maintained. No areas of chronic cortical infarction. No acute intracranial hemorrhage. Single punctate chronic microhemorrhage noted within the left frontal lobe, of doubtful significance in isolation. No mass lesion, midline shift or mass effect. No hydrocephalus or extra-axial fluid collection. Pituitary gland suprasellar region within normal limits. Vascular: Major intracranial vascular flow voids are maintained. Skull and upper cervical  spine: Craniocervical junction within normal limits. Bone marrow signal intensity normal. No scalp soft tissue abnormality. Sinuses/Orbits: Prior bilateral ocular lens replacement. Paranasal sinuses are largely clear. No significant mastoid effusion. Other: None. IMPRESSION: 1. No acute intracranial abnormality. 2. Age-related cerebral atrophy with mild to moderate chronic small vessel ischemic disease. Electronically Signed   By: Jeannine Boga M.D.   On: 11/10/2021 21:37   DG Chest Portable 1 View  Result Date: 11/10/2021 CLINICAL DATA:  Altered mental status, syncope EXAM: PORTABLE CHEST 1 VIEW COMPARISON:  10/28/2021 FINDINGS: Transverse diameter of heart is increased. There is interval decrease in interstitial markings in both lungs. There is residual prominence of interstitial markings, more so on the left side. No new focal infiltrates are seen. There is no significant pleural effusion or pneumothorax. Right hemidiaphragm is elevated. There  is interposition of colon between liver and diaphragm. IMPRESSION: Cardiomegaly. There is partial clearing of interstitial infiltrates suggesting resolving CHF or resolving multifocal pneumonia. No new focal infiltrates are seen. Electronically Signed   By: Elmer Picker M.D.   On: 11/10/2021 16:36   CT HEAD WO CONTRAST (5MM)  Result Date: 11/10/2021 CLINICAL DATA:  Altered level of consciousness, confusion EXAM: CT HEAD WITHOUT CONTRAST TECHNIQUE: Contiguous axial images were obtained from the base of the skull through the vertex without intravenous contrast. RADIATION DOSE REDUCTION: This exam was performed according to the departmental dose-optimization program which includes automated exposure control, adjustment of the mA and/or kV according to patient size and/or use of iterative reconstruction technique. COMPARISON:  03/31/2018 FINDINGS: Brain: Scattered hypodensities within the periventricular white matter and within the right basal ganglia are  compatible with age-indeterminate small vessel ischemic changes. No other signs of acute infarct or hemorrhage. The lateral ventricles and midline structures are otherwise unremarkable. No acute extra-axial fluid collections. No mass effect. Vascular: Prominent atherosclerosis of the internal carotid arteries. No hyperdense vessel. Skull: Normal. Negative for fracture or focal lesion. Sinuses/Orbits: No acute finding. Other: None. IMPRESSION: 1. Age-indeterminate small vessel ischemic changes within the periventricular white matter and right basal ganglia. 2. Otherwise no acute infarct or hemorrhage. Electronically Signed   By: Randa Ngo M.D.   On: 11/10/2021 15:51   DG Chest Port 1 View  Result Date: 10/28/2021 CLINICAL DATA:  Shortness of breath, chest pain. EXAM: PORTABLE CHEST 1 VIEW COMPARISON:  October 25, 2021. FINDINGS: Stable cardiomegaly. Increased coarse reticular opacities are noted throughout both lungs concerning for edema or atypical inflammation. Elevated right hemidiaphragm is noted. Bony thorax is unremarkable. IMPRESSION: Increased bilateral lung opacities are noted concerning for edema or atypical inflammation. Electronically Signed   By: Marijo Conception M.D.   On: 10/28/2021 08:31   CT ABDOMEN PELVIS WO CONTRAST  Result Date: 10/27/2021 CLINICAL DATA:  Retroperitoneal bleed suspected eval for retroperitoneal bleed EXAM: CT ABDOMEN AND PELVIS WITHOUT CONTRAST TECHNIQUE: Multidetector CT imaging of the abdomen and pelvis was performed following the standard protocol without IV contrast. RADIATION DOSE REDUCTION: This exam was performed according to the departmental dose-optimization program which includes automated exposure control, adjustment of the mA and/or kV according to patient size and/or use of iterative reconstruction technique. COMPARISON:  CT abdomen pelvis 12/10/2011 trauma x-ray left hip 10/25/2021 FINDINGS: Lower chest: Peribronchovascular consolidation within the  visualized lung bases. Peripheral reticulations noted. Left heart Amplatzer device noted. Aortic vascular calcifications. At least 3 vessel coronary artery calcification. Cardiac findings suggestive of anemia trace left pleural effusion. Hepatobiliary: No focal liver abnormality. No gallstones, gallbladder wall thickening, or pericholecystic fluid. No biliary dilatation. Pancreas: Diffusely atrophic. No focal lesion. Otherwise normal pancreatic contour. No surrounding inflammatory changes. No main pancreatic ductal dilatation. Spleen: Normal in size without focal abnormality. Adrenals/Urinary Tract: No adrenal nodule bilaterally. Bilateral kidneys enhance symmetrically. No hydronephrosis. No hydroureter. The urinary bladder is unremarkable. Stomach/Bowel: Stomach is within normal limits. No evidence of bowel wall thickening or dilatation. Colonic diverticulosis. The appendix is not definitely identified with no inflammatory changes in the right lower quadrant to suggest acute appendicitis. Vascular/Lymphatic: No abdominal aorta or iliac aneurysm. Moderate to severe atherosclerotic plaque of the aorta and its branches. No abdominal, pelvic, or inguinal lymphadenopathy. Reproductive: Not visualized due to streak artifact originating from bilateral femoral surgical hardware. Other: No intraperitoneal free fluid. No intraperitoneal free gas. No organized fluid collection. No retroperitoneal hematoma formation. Musculoskeletal: No abdominal  wall hernia or abnormality. Subcutaneus soft tissue edema and emphysema along the left hip. Left hip 2.5 x 1.8 cm subcutaneus soft tissue hematoma formation. Overlying skin staples. Atrophic degeneration of the right iliopsoas muscle. No suspicious lytic or blastic osseous lesions. No acute displaced fracture. Multilevel degenerative changes of the spine. Total right hip arthroplasty partially visualized. Proximal femur fracture status post intramedullary fixation partially  visualized. IMPRESSION: 1. No retroperitoneal hematoma. 2. Colonic diverticulosis with no acute diverticulitis. 3. Cardiac findings suggestive of anemia. 4. Peribronchovascular consolidation within the visualized lung bases suggestive of acute infection/inflammation. Peripheral reticulations likely representing underlying fibrosis. 5. Trace left pleural effusion. 6.  Aortic Atherosclerosis (ICD10-I70.0). Electronically Signed   By: Iven Finn M.D.   On: 10/27/2021 18:09   ECHOCARDIOGRAM COMPLETE  Result Date: 10/27/2021    ECHOCARDIOGRAM REPORT   Patient Name:   ROBY BROOMHEAD Date of Exam: 10/27/2021 Medical Rec #:  [id number]         Height:       73.0 in Accession #:    [id number]        Weight:       215.6 lb Date of Birth:  Dec 04, 1940          BSA:          2.221 m Patient Age:    59 years          BP:           71/61 mmHg Patient Gender: M                 HR:           117 bpm. Exam Location:  [facility] Procedure: 2D Echo, Cardiac Doppler and Color Doppler Indications:     Atrial Fibrillation I 48.91  History:         Patient has prior history of Echocardiogram examinations, most                  recent 05/17/2019. CHF; Risk Factors:Diabetes, Hypertension and                  Dyslipidemia.  Sonographer:     Sherrie Sport Referring Phys:  [id number] Emeterio Reeve Diagnosing Phys: Ida Rogue MD  Sonographer Comments: Suboptimal apical window and Technically challenging study due to limited acoustic windows. IMPRESSIONS  1. Left ventricular ejection fraction, by estimation, is 60 to 65%. The left ventricle has normal function. The left ventricle has no regional wall motion abnormalities. Left ventricular diastolic parameters are indeterminate.  2. Right ventricular systolic function is normal. The right ventricular size is normal. There is normal pulmonary artery systolic pressure. The estimated right ventricular systolic pressure is 123456 mmHg.  3. Left atrial size was severely dilated.  4. The mitral valve is  normal in structure. No evidence of mitral valve regurgitation. No evidence of mitral stenosis.  5. The aortic valve was not well visualized. There is moderate calcification of the aortic valve. Aortic valve regurgitation is not visualized. Aortic valve sclerosis is present, with no evidence of aortic valve stenosis. Aortic valve area, by VTI measures 1.29 cm. Aortic valve mean gradient measures 6.3 mmHg. Aortic valve Vmax measures 1.69 m/s.  6. The inferior vena cava is normal in size with greater than 50% respiratory variability, suggesting right atrial pressure of 3 mmHg. FINDINGS  Left Ventricle: Left ventricular ejection fraction, by estimation, is 60 to 65%. The left ventricle has normal function. The left ventricle has no regional wall motion  abnormalities. The left ventricular internal cavity size was normal in size. There is  no left ventricular hypertrophy. Left ventricular diastolic parameters are indeterminate. Right Ventricle: The right ventricular size is normal. No increase in right ventricular wall thickness. Right ventricular systolic function is normal. There is normal pulmonary artery systolic pressure. The tricuspid regurgitant velocity is 1.78 m/s, and  with an assumed right atrial pressure of 5 mmHg, the estimated right ventricular systolic pressure is 123456 mmHg. Left Atrium: Left atrial size was severely dilated. Right Atrium: Right atrial size was normal in size. Pericardium: There is no evidence of pericardial effusion. Mitral Valve: The mitral valve is normal in structure. No evidence of mitral valve regurgitation. No evidence of mitral valve stenosis. Tricuspid Valve: The tricuspid valve is normal in structure. Tricuspid valve regurgitation is mild . No evidence of tricuspid stenosis. Aortic Valve: The aortic valve was not well visualized. There is moderate calcification of the aortic valve. Aortic valve regurgitation is not visualized. Aortic valve sclerosis is present, with no evidence  of aortic valve stenosis. Aortic valve mean gradient measures 6.3 mmHg. Aortic valve peak gradient measures 11.4 mmHg. Aortic valve area, by VTI measures 1.29 cm. Pulmonic Valve: The pulmonic valve was normal in structure. Pulmonic valve regurgitation is not visualized. No evidence of pulmonic stenosis. Aorta: The aortic root is normal in size and structure. Venous: The inferior vena cava is normal in size with greater than 50% respiratory variability, suggesting right atrial pressure of 3 mmHg. IAS/Shunts: No atrial level shunt detected by color flow Doppler.  LEFT VENTRICLE PLAX 2D LVIDd:         5.00 cm LVIDs:         4.15 cm LV PW:         1.40 cm LV IVS:        1.10 cm LVOT diam:     2.00 cm LV SV:         40 LV SV Index:   18 LVOT Area:     3.14 cm  RIGHT VENTRICLE RV Basal diam:  4.10 cm RV S prime:     11.10 cm/s TAPSE (M-mode): 2.7 cm LEFT ATRIUM            Index        RIGHT ATRIUM           Index LA diam:      5.50 cm  2.48 cm/m   RA Area:     21.90 cm LA Vol (A2C): 104.0 ml 46.82 ml/m  RA Volume:   70.70 ml  31.83 ml/m LA Vol (A4C): 184.0 ml 82.83 ml/m  AORTIC VALVE AV Area (Vmax):    1.15 cm AV Area (Vmean):   1.05 cm AV Area (VTI):     1.29 cm AV Vmax:           169.00 cm/s AV Vmean:          117.667 cm/s AV VTI:            0.307 m AV Peak Grad:      11.4 mmHg AV Mean Grad:      6.3 mmHg LVOT Vmax:         62.00 cm/s LVOT Vmean:        39.400 cm/s LVOT VTI:          0.126 m LVOT/AV VTI ratio: 0.41  AORTA Ao Root diam: 3.60 cm MITRAL VALVE  TRICUSPID VALVE MV Area (PHT): 4.63 cm     TR Peak grad:   12.7 mmHg MV Decel Time: 164 msec     TR Vmax:        178.00 cm/s MV E velocity: 112.00 cm/s                             SHUNTS                             Systemic VTI:  0.13 m                             Systemic Diam: 2.00 cm Ida Rogue MD Electronically signed by Ida Rogue MD Signature Date/Time: 10/27/2021/4:19:09 PM    Final    DG HIP UNILAT WITH PELVIS 2-3 VIEWS  LEFT  Result Date: 10/25/2021 CLINICAL DATA:  ORIF LEFT femur EXAM: DG HIP (WITH OR WITHOUT PELVIS) 2-3V LEFT COMPARISON:  10/25/2021 Fluoroscopy time: 3 minutes 47 seconds Dose: Not provided; please refer to operative records Images: 6 FINDINGS: Osseous demineralization. IM nail with 2 compression screws placed across an intertrochanteric fracture LEFT femur. Significant improvement in fracture fragment alignment since previous exam. Distal locking screw present. IMPRESSION: Post nailing of intertrochanteric fracture LEFT femur. Electronically Signed   By: Lavonia Dana M.D.   On: 10/25/2021 18:42   DG C-Arm 1-60 Min-No Report  Result Date: 10/25/2021 Fluoroscopy was utilized by the requesting physician.  No radiographic interpretation.   CT CHEST WO CONTRAST  Result Date: 10/25/2021 CLINICAL DATA:  Fall.  Pleural effusion. EXAM: CT CHEST WITHOUT CONTRAST TECHNIQUE: Multidetector CT imaging of the chest was performed following the standard protocol without IV contrast. RADIATION DOSE REDUCTION: This exam was performed according to the departmental dose-optimization program which includes automated exposure control, adjustment of the mA and/or kV according to patient size and/or use of iterative reconstruction technique. COMPARISON:  Radiograph of same day.  CT scan of Jul 04, 2017. FINDINGS: Cardiovascular: Atherosclerosis of thoracic aorta is noted without aneurysm formation. Aortic valves calcifications are noted. Normal cardiac size. No pericardial effusion. Mild coronary artery calcifications are noted. Mediastinum/Nodes: No enlarged mediastinal or axillary lymph nodes. Thyroid gland, trachea, and esophagus demonstrate no significant findings. Lungs/Pleura: No pneumothorax or pleural effusion is noted. Reticular densities are noted peripherally in both lungs suggesting chronic interstitial lung disease. Interval development of 6 mm nodule seen in left lower lobe best seen on image number 121 of series 3.  Multiple patchy airspace opacities are noted in the right lung which may represent multifocal inflammation, or potentially severe fibrosis secondary to chronic scarring or inflammation. Some degree of mosaic pattern is seen in the left lung suggesting multifocal inflammation or air trapping secondary to small airways disease. Some degree of bronchiectasis is noted in both lower lobes. Upper Abdomen: No acute abnormality. Musculoskeletal: No chest wall mass or suspicious bone lesions identified. IMPRESSION: There is interval development of multiple patchy airspace opacities in the right lung which may represent multifocal inflammation or pneumonia, or potentially severe masslike fibrosis secondary to chronic scarring or inflammation. Some degree of mosaic pattern is also noted in the left lung suggesting multifocal inflammation or air trapping secondary to small airways disease. There are again noted reticular densities noted peripherally throughout both lungs suggesting chronic interstitial lung disease, as well as some degree of bronchiectasis seen  in both lower lobes, most consistent with chronic findings. Interval development of small nodule seen in left lower lobe. Non-contrast chest CT at 6-12 months is recommended. If the nodule is stable at time of repeat CT, then future CT at 18-24 months (from today's scan) is considered optional for low-risk patients, but is recommended for high-risk patients. This recommendation follows the consensus statement: Guidelines for Management of Incidental Pulmonary Nodules Detected on CT Images: From the Fleischner Society 2017; Radiology 2017; 284:228-243. Mild coronary artery calcifications are noted. Aortic valve calcifications are noted. Aortic Atherosclerosis (ICD10-I70.0). Electronically Signed   By: Marijo Conception M.D.   On: 10/25/2021 14:56   DG Chest 1 View  Result Date: 10/25/2021 CLINICAL DATA:  Fall EXAM: CHEST  1 VIEW COMPARISON:  Radiograph 05/16/2019  FINDINGS: Enlarged cardiac silhouette. Appendage occlusion device noted. There is pulmonary vascular congestion with interstitial prominent bilaterally, left greater than right. No pleural effusion. No pneumothorax. No acute osseous abnormality identified on frontal radiographs of the chest. IMPRESSION: Cardiomegaly with pulmonary vascular congestion and interstitial prominence bilaterally, left greater than right, which could reflect a degree of interstitial edema. Electronically Signed   By: Maurine Simmering M.D.   On: 10/25/2021 11:57   DG Hip Unilat With Pelvis 2-3 Views Left  Result Date: 10/25/2021 CLINICAL DATA:  Fall, pain EXAM: DG HIP (WITH OR WITHOUT PELVIS) 2-3V LEFT COMPARISON:  None Available. FINDINGS: Osteopenia. Comminuted, displaced and impacted intratrochanteric fractures of the left hip. Status post right hip total arthroplasty. No displaced fracture or dislocation of the bony pelvis. Nonobstructive pattern of overlying bowel gas. IMPRESSION: 1. Comminuted, displaced and impacted intratrochanteric fractures of the left hip. 2. Status post right hip total arthroplasty. 3. No displaced fracture or dislocation of the bony pelvis. Electronically Signed   By: Delanna Ahmadi M.D.   On: 10/25/2021 11:52     Assessment and Recommendation  81 y.o. male with known diabetes hypotension hyperlipidemia and chronic diastolic dysfunction congestive heart failure having a syncopal episode likely secondary to multiple factors but no current evidence of acute coronary syndrome or congestive heart failure.  Patient's heart rate control is reasonable and with hydration and discontinuation of Lasix t.  We have additionally started digoxin for heart rate control and midodrine for blood pressure elevation.   1.  Continuation aspirin for further risk reduction cardiovascular event but no evidence need for anticoagulation due to Watchman 2.  Continue metoprolol at current dose which is reasonable heart rate control as  before and would not add any medications due to concerns of worsening hypotension and would not subtract any medications due to concerns of rapid heart rate 3.  More concerted effort for physical rehabilitation today to truly assess above.  The patient may have hypotension due to lack of activity as well.  The patient has been nonambulatory for several weeks 4.  Avoid Lasix at this time due to possible dehydration and hypotension causing above 5.  Further discussion with electrophysiology about ablation only if absolutely necessary and further failure of above 6.  If ambulating well would continue rehabilitation  Signed, Serafina Royals M.D. FACC

## 2021-11-14 NOTE — TOC Progression Note (Signed)
Transition of Care [facility]) - Progression Note    Patient Details  Name: Jeremy Herrera MRN: [id number] Date of Birth: 18-Apr-1940  Transition of Care [facility]) CM/SW Contact  Beverly Sessions, RN Phone Number: 11/14/2021, 3:06 PM  Clinical Narrative:     Per MD anticipated DC tomorrow Confirmed with Tiffany at [facility] they can accept patient back pending insurance [place] started in [place] portal.  At this time portal is not showing pending auth. Spoke with rep and he provided me with a pending auth ID number [id number] and confirms they have received clinical   Expected Discharge Plan: [place] Barriers to Discharge: Continued Medical Work up  Expected Discharge Plan and Services Expected Discharge Plan: [place]   Discharge Planning Services: CM Consult Post Acute Care Choice: [place] Living arrangements for the past 2 months: Single Family Home                                       Social Determinants of Health (SDOH) Interventions    Readmission Risk Interventions     No data to display

## 2021-11-14 NOTE — Progress Notes (Addendum)
Progress Note   Patient: Jeremy Herrera [id number] DOB: [birth year] DOA: 11/10/2021     2 DOS: the patient was seen and examined on 11/14/2021   Brief hospital course: ARTHELL AMBUSH is a 81 y.o. Caucasian male with medical history significant for CHF, type 2 diabetes mellitus, GERD, hypertension, dyslipidemia and BPH, who presented to the ED on 11/10/2021 from [facility]rehab for evaluation after having an episode of unresponsiveness.  He reported feeling very weak all over, but no other symptoms.  Pt had been discharged to rehab on 11/04/2021 after having surgery for left hip fracture repair.  That admission was complicated by A-fib with RVR, hypotension issues and acute blood loss anemia that required vasopressor support and blood transfusion.  He was started on midodrine in order to minimize hypotensive episodes with attempts at HR control with metoprolol.    In the ED, patient was found to be in A-fib with RVR with HR in 110's for the most part.  Vitals also notable for tachypnea.  CXR showed resolving interstitial infiltrates compared to prior.  CT of head  showed no acute findings.  Patient was admitted for evaluation of unresponsive episode, with differential including stroke and syncope.  MRI brain was negative for stroke. Carotid duplex U/S showed no hemodynamically significant stenosis.   Echocardiogram with bubble study showed normal EF 60-65% with normal diastolic parameters and mild-mod MR.  9/22-24 - pt in A-fib RVR with HR varying from low 100's to 130's on telemetry.  Ongoing tachynpea.  9/24 -added digoxin, metoprolol dose reduced 9/25 -improved heart rates and orthostatic symptoms   Assessment and Plan: * Unresponsiveness Stroke ruled out.  Suspect transient hypotension in the setting of A-fib with RVR.  MRI brain showed no stroke. Unremarkable echo and carotid duplex U/S.   -Neurochecks- Brain MRI without contrast will be obtained. - Follow up PT/OT/SLP  recommendations -Started on aspirin statin on admission. -- Telemetry - Check orthostatic vitals - Monitor for hypoglycemia  Closed displaced intertrochanteric fracture of left femur (HCC) Recent.  Discharged to SNF/rehab on 9/15.  I reached out to [facility], they came and removed staples on 9/22, Steri-Strips applied. -- Follow-up with Dr. Posey Pronto in 4 weeks for left femur x-ray  Atrial fibrillation with rapid ventricular response (HCC) BP stable but soft at times.   9/24: HR 110-130 on monitor, remains hypotensive despite fluids and max midodrine dose 9/25: Heart rates and BP improved with digoxin, remains slightly orthostatic but significantly improved and per PT was asymptomatic today --Added Digoxin 0.25 mg daily -continue --Toprol-XL was reduced 37.5 >> 12.5 mg  --IV metoprolol 5 mg 14h PRN for HR>110 - Cardiology consulted - recommended IV hydration and continue  --Monitor BP closely --Telemetry  Orthostatic hypotension Orthostatic vitals positive with PT today (9/22).  Suspect this caused unresponsive episode.  Soft BP's due to metoprolol for HR control, currently in A-fib RVR despite metoprolol. 9/25: Orthostatics have finally improved with reduction in metoprolol and addition of digoxin for A-fib.  Blood pressures drop although not as significantly and patient asymptomatic with PT today --On midodrine 10 mg TID --Daily orthostatic vitals --Stop IV fluids --TED hose  GERD without esophagitis --Continue PPI  Dyslipidemia --Continue statin  Atrial fibrillation, chronic ([place]) See A-fib with RVR. S/p Watchman device.  COPD (chronic obstructive pulmonary disease) (HCC) Stable, not exacerbated. Continue Dulera, PRN albuterol Monitor closely.  Diastolic CHF, chronic (HCC) Appear euvolemic, compensated. Continue metoprolol Hold off home PRN oral Lasix given concern for hypovolemia contributing to rapid  A-fib and orthostatic vitals.  Depression --Continue Zoloft  CAD  (coronary artery disease) Stable, no active CP. Continue metoprolol, statin, ASA        Subjective: Pt seen awake sitting up in bed when seen on rounds today.  He reports feeling well.  Denies chest pain, shortness of breath dizziness or lightheadedness.  He has not been up with therapy yet today but eager to see how he feels with this.  Heart rates on the monitor during encounter were mid 80s to low 100s, improved from 1 teens to 130 prior days.   Physical Exam: Vitals:   11/13/21 1925 11/14/21 0344 11/14/21 0421 11/14/21 0805  BP: (!) 126/93 133/73 123/72 (!) 134/93  Pulse: 91 92 84 97  Resp: 20 18 20 20   Temp: 98 F (36.7 C) 98.4 F (36.9 C) 97.7 F (36.5 C) 98 F (36.7 C)  TempSrc: Oral  Oral Oral  SpO2: 93% 99% 99% 100%  Weight:      Height:       General exam: awake, alert, no acute distress HEENT: moist mucus membranes, hearing grossly normal  Respiratory system: CTAB, no wheezes, rales or rhonchi, normal respiratory effort. Cardiovascular system: Irregular rhythm, regular rate, no pedal edema.   Central nervous system: A&O x3. no gross focal neurologic deficits, normal speech Extremities: moves all, no edema, normal tone Skin: dry, intact, normal temperature Psychiatry: normal mood, congruent affect, judgement and insight appear normal   Data Reviewed: Today's labs reviewed and unremarkable    Studies/Procedures: Carotid U/S negative for hemodynamically significant stenosis.   Echo normal EF, no shunt seen on bubble study   Family Communication: Daughter Joni updated by phone this afternoon  Disposition: Status is: Inpatient Remains inpatient appropriate because: Requires return to SNF/rehab.  Monitoring improvement in orthostatic symptoms, HR's and BPs.      Planned Discharge Destination: Skilled nursing facility    Time spent: 35 minutes  Author: Ezekiel Slocumb, DO 11/14/2021 1:24 PM  For on call review [email or web address].

## 2021-11-14 NOTE — Progress Notes (Signed)
Physical Therapy Treatment Patient Details Name: Jeremy Herrera MRN: [id number] DOB: 20-Oct-1940 Today's Date: 11/14/2021   History of Present Illness Jeremy Herrera is an 57yoM who comes to [facility] on 11/10/21 after syncope at facility, pt coming in from [facility] where pt was there for STR since 11/04/21 for rehab needs s/p IM fixation of Left hip fracture. While previously admitted pt had several days of labile BP and HR with transition from supine to not-supine. CHF, DM, GERD, HTN, HLD, falls.    PT Comments    Co-tx with OT this am for orthostatics and hope of increased mobility.   Supine 129/70  p 87 Sit 92/72  p 77 After recovery sitting 134/67  p 90 Standing 99/74  p 90 Stnd 3 104/72  p 92  Pt denies dizziness during session. He is able to stand with min a x 2. Attempted stepping but he is unable to steps and WB LLE without buckling.  Lateral scoot in sitting with effort but min guard.    On second stand, HR does increase to 148 briefly but slowly recovers with return to supine.  Overall improved activity tolerance today.   Recommendations for follow up therapy are one component of a multi-disciplinary discharge planning process, led by the attending physician.  Recommendations may be updated based on patient status, additional functional criteria and insurance authorization.  Follow Up Recommendations  Skilled nursing-short term rehab (<3 hours/day)     Assistance Recommended at Discharge Frequent or constant Supervision/Assistance  Patient can return home with the following Two people to help with walking and/or transfers;Two people to help with bathing/dressing/bathroom;Help with stairs or ramp for entrance;Assist for transportation;A little help with bathing/dressing/bathroom   Equipment Recommendations  None recommended by PT    Recommendations for Other Services       Precautions / Restrictions Precautions Precautions: Fall Precaution Comments: orthostatics/  HR Restrictions Weight Bearing Restrictions: Yes RUE Weight Bearing: Weight bearing as tolerated LLE Weight Bearing: Weight bearing as tolerated     Mobility  Bed Mobility Overal bed mobility: Needs Assistance Bed Mobility: Supine to Sit, Sit to Supine     Supine to sit: Min guard Sit to supine: Min assist        Transfers Overall transfer level: Needs assistance Equipment used: Rolling walker (2 wheels) Transfers: Sit to/from Stand Sit to Stand: Mod assist, From elevated surface, +2 safety/equipment          Lateral/Scoot Transfers: Min guard      Ambulation/Gait Ambulation/Gait assistance: Mod assist, +2 physical assistance Gait Distance (Feet): 1 Feet Assistive device: Rolling walker (2 wheels) Gait Pattern/deviations: Narrow base of support Gait velocity: decreased     General Gait Details: difficulty stepping - WB LLE   Stairs             Wheelchair Mobility    Modified Rankin (Stroke Patients Only)       Balance Overall balance assessment: Needs assistance Sitting-balance support: Feet supported Sitting balance-Leahy Scale: Good     Standing balance support: Bilateral upper extremity supported Standing balance-Leahy Scale: Fair Standing balance comment: min a once standing                            Cognition Arousal/Alertness: Awake/alert Behavior During Therapy: WFL for tasks assessed/performed Overall Cognitive Status: Within Functional Limits for tasks assessed  Exercises      General Comments        Pertinent Vitals/Pain Pain Assessment Pain Assessment: Faces Faces Pain Scale: Hurts little more Pain Location: L LE w/ standing Pain Descriptors / Indicators: Grimacing, Moaning Pain Intervention(s): Limited activity within patient's tolerance, Repositioned    Home Living                          Prior Function            PT Goals  (current goals can now be found in the care plan section) Progress towards PT goals: Progressing toward goals    Frequency    7X/week      PT Plan Current plan remains appropriate    Co-evaluation PT/OT/SLP Co-Evaluation/Treatment: Yes            AM-PAC PT "6 Clicks" Mobility   Outcome Measure  Help needed turning from your back to your side while in a flat bed without using bedrails?: A Little Help needed moving from lying on your back to sitting on the side of a flat bed without using bedrails?: A Little Help needed moving to and from a bed to a chair (including a wheelchair)?: A Lot Help needed standing up from a chair using your arms (e.g., wheelchair or bedside chair)?: A Lot Help needed to walk in hospital room?: Total Help needed climbing 3-5 steps with a railing? : Total 6 Click Score: 12    End of Session Equipment Utilized During Treatment: Oxygen Activity Tolerance: Patient limited by fatigue;Treatment limited secondary to medical complications (Comment) Patient left: in bed;with call bell/phone within reach;with bed alarm set Nurse Communication: Mobility status PT Visit Diagnosis: Muscle weakness (generalized) (M62.81);Difficulty in walking, not elsewhere classified (R26.2);Unsteadiness on feet (R26.81);Other abnormalities of gait and mobility (R26.89);Dizziness and giddiness (R42);Other symptoms and signs involving the nervous system (R29.898) Pain - Right/Left: Left Pain - part of body: Ankle and joints of foot     Time: [id number] PT Time Calculation (min) (ACUTE ONLY): 23 min  Charges:  $Therapeutic Activity: 8-22 mins                   Chesley Noon, PTA 11/14/21, 1:08 PM

## 2021-11-14 NOTE — Progress Notes (Signed)
Occupational Therapy Treatment Patient Details Name: Jeremy Herrera MRN: [id number] DOB: 1940-10-22 Today's Date: 11/14/2021   History of present illness Winford Hehn is an 40yoM who comes to [facility] on 11/10/21 after syncope at facility, pt coming in from [facility] where pt was there for STR since 11/04/21 for rehab needs s/p IM fixation of Left hip fracture. While previously admitted pt had several days of labile BP and HR with transition from supine to not-supine. CHF, DM, GERD, HTN, HLD, falls.   OT comments  Pt seen for OT tx and overlapping co-tx with PT to address ADL mobility and maximize outcomes. Orthostatic vitals taken: Supine 129/70  p 87, Sit 92/72  p 77, After recovery sitting 134/67  p 90, Standing 99/74  p 90, Stnd 3 104/72  p 92). Pt denied dizziness. Pt completed grooming tasks seated EOB during brief rest break prior to standing. Pt required MIN A +2 for standing from EOB. MAX A for pericare in standing with heavy reliance on RW. Pt completed lateral scoots without direct assist. Pt progressing towards goals.    Recommendations for follow up therapy are one component of a multi-disciplinary discharge planning process, led by the attending physician.  Recommendations may be updated based on patient status, additional functional criteria and insurance authorization.    Follow Up Recommendations  Skilled nursing-short term rehab (<3 hours/day)    Assistance Recommended at Discharge Frequent or constant Supervision/Assistance  Patient can return home with the following  A lot of help with walking and/or transfers;A lot of help with bathing/dressing/bathroom;Assistance with cooking/housework;Help with stairs or ramp for entrance   Equipment Recommendations  Other (comment) (defer to next venue)    Recommendations for Other Services      Precautions / Restrictions Precautions Precautions: Fall Precaution Comments: orthostatics/ HR Restrictions Weight Bearing  Restrictions: Yes RUE Weight Bearing: Weight bearing as tolerated LLE Weight Bearing: Weight bearing as tolerated       Mobility Bed Mobility Overal bed mobility: Needs Assistance Bed Mobility: Supine to Sit, Sit to Supine     Supine to sit: Min guard Sit to supine: Min assist        Transfers Overall transfer level: Needs assistance Equipment used: Rolling walker (2 wheels) Transfers: Sit to/from Stand, Bed to chair/wheelchair/BSC Sit to Stand: From elevated surface, +2 physical assistance, Min assist          Lateral/Scoot Transfers: Min guard       Balance Overall balance assessment: Needs assistance Sitting-balance support: Feet supported Sitting balance-Leahy Scale: Good     Standing balance support: Bilateral upper extremity supported Standing balance-Leahy Scale: Fair                             ADL either performed or assessed with clinical judgement   ADL Overall ADL's : Needs assistance/impaired     Grooming: Sitting;Set up;Supervision/safety                                      Extremity/Trunk Assessment              Vision       Perception     Praxis      Cognition Arousal/Alertness: Awake/alert Behavior During Therapy: WFL for tasks assessed/performed Overall Cognitive Status: Within Functional Limits for tasks assessed  Exercises Other Exercises Other Exercises: Pt instructed in ADL transfers, PLB    Shoulder Instructions       General Comments HR up to 130's-148 with exertion    Pertinent Vitals/ Pain       Pain Assessment Pain Assessment: Faces Faces Pain Scale: Hurts little more Pain Location: L LE w/ standing Pain Descriptors / Indicators: Grimacing, Moaning, Guarding Pain Intervention(s): Limited activity within patient's tolerance, Monitored during session, Repositioned  Home Living                                           Prior Functioning/Environment              Frequency  Min 2X/week        Progress Toward Goals  OT Goals(current goals can now be found in the care plan section)  Progress towards OT goals: Progressing toward goals  Acute Rehab OT Goals Patient Stated Goal: to go home OT Goal Formulation: With patient Time For Goal Achievement: 11/26/21 Potential to Achieve Goals: Good  Plan Discharge plan remains appropriate;Frequency remains appropriate    Co-evaluation    PT/OT/SLP Co-Evaluation/Treatment: Yes Reason for Co-Treatment: For patient/therapist safety;To address functional/ADL transfers PT goals addressed during session: Mobility/safety with mobility;Balance OT goals addressed during session: ADL's and self-care      AM-PAC OT "6 Clicks" Daily Activity     Outcome Measure   Help from another person eating meals?: None Help from another person taking care of personal grooming?: A Little Help from another person toileting, which includes using toliet, bedpan, or urinal?: A Lot Help from another person bathing (including washing, rinsing, drying)?: A Lot Help from another person to put on and taking off regular upper body clothing?: A Little Help from another person to put on and taking off regular lower body clothing?: A Lot 6 Click Score: 16    End of Session Equipment Utilized During Treatment: Gait belt;Rolling walker (2 wheels)  OT Visit Diagnosis: Muscle weakness (generalized) (M62.81);Pain;Unsteadiness on feet (R26.81);Other abnormalities of gait and mobility (R26.89) Pain - Right/Left: Left Pain - part of body: Hip   Activity Tolerance Patient tolerated treatment well   Patient Left in bed;with call bell/phone within reach;with bed alarm set   Nurse Communication Mobility status        Time: 4315-4008 OT Time Calculation (min): 23 min  Charges: OT General Charges $OT Visit: 1 Visit OT Treatments $Therapeutic Activity: 8-22  mins  Arman Filter., MPH, MS, OTR/L ascom [phone] 11/14/21, 2:07 PM

## 2021-11-15 DIAGNOSIS — R4189 Other symptoms and signs involving cognitive functions and awareness: Secondary | ICD-10-CM | POA: Diagnosis not present

## 2021-11-15 MED ORDER — MAGNESIUM SULFATE 2 GM/50ML IV SOLN
2.0000 g | Freq: Once | INTRAVENOUS | Status: AC
  Administered 2021-11-15: 2 g via INTRAVENOUS
  Filled 2021-11-15: qty 50

## 2021-11-15 NOTE — TOC Progression Note (Signed)
Transition of Care [facility]) - Progression Note    Patient Details  Name: Jeremy Herrera MRN: [id number] Date of Birth: [birth year]  Transition of Care [facility]) CM/SW Contact  Beverly Sessions, RN Phone Number: 11/15/2021, 2:30 PM  Clinical Narrative:      Insurance obtained [id number].  Valid through 9/28.  Per MD anticipated DC tomorrow, Magda Paganini at [facility] aware  Expected Discharge Plan: [place] Barriers to Discharge: Continued Medical Work up  Expected Discharge Plan and Services Expected Discharge Plan: [place]   Discharge Planning Services: CM Consult Post Acute Care Choice: [place] Living arrangements for the past 2 months: Single Family Home                                       Social Determinants of Health (SDOH) Interventions    Readmission Risk Interventions     No data to display

## 2021-11-15 NOTE — Care Management Important Message (Signed)
Important Message  Patient Details  Name: Jeremy Herrera MRN: [id number] Date of Birth: Jun 23, 1940   Medicare Important Message Given:  Yes     Dannette Barbara 11/15/2021, 11:25 AM

## 2021-11-15 NOTE — TOC Progression Note (Signed)
Transition of Care [facility]) - Progression Note    Patient Details  Name: DHILAN BRAUER MRN: [id number] Date of Birth: Jan 21, 1941  Transition of Care [facility]) CM/SW Contact  Beverly Sessions, RN Phone Number: 11/15/2021, 11:48 AM  Clinical Narrative:     Insurance auth still pending   Expected Discharge Plan: Skilled Nursing Facility Barriers to Discharge: Continued Medical Work up  Expected Discharge Plan and Services Expected Discharge Plan: [place]   Discharge Planning Services: CM Consult Post Acute Care Choice: [place] Living arrangements for the past 2 months: Single Family Home                                       Social Determinants of Health (SDOH) Interventions    Readmission Risk Interventions     No data to display

## 2021-11-15 NOTE — Progress Notes (Signed)
PT Cancellation Note  Patient Details Name: Jeremy Herrera MRN: [id number] DOB: 26-Apr-1940   Cancelled Treatment:    Reason Eval/Treat Not Completed: Fatigue/lethargy limiting ability to participate  Offered and encouraged session today.  Pt reports feeling generally fatigued today and not able to participate.  He did say he would try if I wanted him to but did not feel up to it.  Pt left to rest as he did appear more fatigued today.  Denied needs at this time or need for RN.   Chesley Noon 11/15/2021, 11:42 AM

## 2021-11-15 NOTE — TOC Progression Note (Signed)
Transition of Care [facility]) - Progression Note    Patient Details  Name: Jeremy Herrera MRN: [id number] Date of Birth: 1940/12/01  Transition of Care [facility]) CM/SW Contact  Beverly Sessions, RN Phone Number: 11/15/2021, 8:48 AM  Clinical Narrative:    Insurance auth pending    Expected Discharge Plan: Skilled Nursing Facility Barriers to Discharge: Continued Medical Work up  Expected Discharge Plan and Services Expected Discharge Plan: [place]   Discharge Planning Services: CM Consult Post Acute Care Choice: [place] Living arrangements for the past 2 months: Single Family Home                                       Social Determinants of Health (SDOH) Interventions    Readmission Risk Interventions     No data to display

## 2021-11-15 NOTE — Progress Notes (Signed)
Progress Note   Patient: Jeremy Herrera YHC:[id number] DOB: [birth year] DOA: 11/10/2021     3 DOS: the patient was seen and examined on 11/15/2021   Brief hospital course: Jeremy Herrera is a 81 y.o. Caucasian male with medical history significant for CHF, type 2 diabetes mellitus, GERD, hypertension, dyslipidemia and BPH, who presented to the ED on 11/10/2021 from [facility]rehab for evaluation after having an episode of unresponsiveness.  He reported feeling very weak all over, but no other symptoms.  Pt had been discharged to rehab on 11/04/2021 after having surgery for left hip fracture repair.  That admission was complicated by A-fib with RVR, hypotension issues and acute blood loss anemia that required vasopressor support and blood transfusion.  He was started on midodrine in order to minimize hypotensive episodes with attempts at HR control with metoprolol.    In the ED, patient was found to be in A-fib with RVR with HR in 110's for the most part.  Vitals also notable for tachypnea.  CXR showed resolving interstitial infiltrates compared to prior.  CT of head  showed no acute findings.  Patient was admitted for evaluation of unresponsive episode, with differential including stroke and syncope.  MRI brain was negative for stroke. Carotid duplex U/S showed no hemodynamically significant stenosis.   Echocardiogram with bubble study showed normal EF 60-65% with normal diastolic parameters and mild-mod MR.  9/22-24 - pt in A-fib RVR with HR varying from low 100's to 130's on telemetry.  Ongoing tachynpea.  9/24 -added digoxin, metoprolol dose reduced 9/25 -improved heart rates and orthostatic symptoms 9/26 -HR earlier 130 (before meds), given PO meds and IV Lopressor push with good response.  Feels unwell in general, very fatigued.   Assessment and Plan: * Unresponsiveness Stroke ruled out.  Suspect transient hypotension in the setting of A-fib with RVR.  MRI brain showed no  stroke. Unremarkable echo and carotid duplex U/S.   -Neurochecks- Brain MRI without contrast will be obtained. - Follow up PT/OT/SLP recommendations -Started on aspirin statin on admission. -- Telemetry - Check orthostatic vitals - Monitor for hypoglycemia  Closed displaced intertrochanteric fracture of left femur (HCC) Recent.  Discharged to SNF/rehab on 9/15.  I reached out to [facility], they came and removed staples on 9/22, Steri-Strips applied. -- Follow-up with Dr. Posey Pronto in 4 weeks for left femur x-ray  Atrial fibrillation with rapid ventricular response (HCC) BP stable but soft at times.   9/24: HR 110-130 on monitor, remains hypotensive despite fluids and max midodrine dose 9/25: Heart rates and BP improved with digoxin, remains slightly orthostatic but significantly improved and per PT was asymptomatic today --Added Digoxin 0.25 mg daily -continue --Toprol-XL was reduced 37.5 >> 12.5 mg  --IV metoprolol 5 mg 14h PRN for HR>110 - Cardiology consulted - recommended IV hydration and continue  --Monitor BP closely --Telemetry  Orthostatic hypotension Orthostatic vitals positive with PT today (9/22).  Suspect this caused unresponsive episode.  Soft BP's due to metoprolol for HR control, currently in A-fib RVR despite metoprolol. 9/25: Orthostatics have finally improved with reduction in metoprolol and addition of digoxin for A-fib.  Blood pressures drop although not as significantly and patient asymptomatic with PT today --On midodrine 10 mg TID --Daily orthostatic vitals --Stop IV fluids --TED hose  GERD without esophagitis --Continue PPI  Dyslipidemia --Continue statin  Atrial fibrillation, chronic ([name]) See A-fib with RVR. S/p Watchman device.  COPD (chronic obstructive pulmonary disease) (HCC) Stable, not exacerbated. Continue Dulera, PRN albuterol Monitor closely.  Diastolic CHF, chronic (HCC) Appear euvolemic, compensated. Continue metoprolol Hold off home  PRN oral Lasix given concern for hypovolemia contributing to rapid A-fib and orthostatic vitals.  Depression --Continue Zoloft  CAD (coronary artery disease) Stable, no active CP. Continue metoprolol, statin, ASA        Subjective: Pt was asleep sitting up in bed this AM.  He woke up but would quickly drift back to sleep.  Said he got pain medication earlier for his legs which were hurting a lot from compression stockings - did not tolerate those.  He appears more depressed today, and he says "I'm trying my best" (to get better).  No other specific complaints. No acute events reported.    Physical Exam: Vitals:   11/14/21 2221 11/15/21 0440 11/15/21 0752 11/15/21 0822  BP: (!) 158/117 125/80 116/77   Pulse: (!) 108 (!) 102 (!) 107 (!) 130  Resp:  18 19   Temp:  97.7 F (36.5 C) 98 F (36.7 C)   TempSrc:  Oral Oral   SpO2: 94% 98% 100%   Weight:      Height:       General exam: sleeping, woke to voice but very drowsy and quickly drift back to sleep while conversing with me, no acute distress HEENT: moist mucus membranes, hearing grossly normal  Respiratory system: CTAB, no wheezes, rales or rhonchi, normal respiratory effort. Cardiovascular system: Irregular rhythm, regular rate, no peripheral edema.   Central nervous system: oriented x 3, no gross focal neurologic deficits, normal speech Extremities: moves all, no edema, normal tone Skin: dry, intact, normal temperature Psychiatry: normal mood, congruent affect, judgement and insight appear normal   Data Reviewed: No new labs today    Studies/Procedures: Carotid U/S negative for hemodynamically significant stenosis.   Echo normal EF, no shunt seen on bubble study   Family Communication: Daughter Joni updated by phone afternoon 9/25  Disposition: Status is: Inpatient Remains inpatient appropriate because: Requires return to SNF/rehab.  Recurrent tachycardia earlier this AM and pt feels generally unwell today.   Will monitor closely another 24 hours and repeat orthostatic vitals.       Planned Discharge Destination: Skilled nursing facility    Time spent: 35 minutes  Author: Pennie Banter, DO 11/15/2021 2:32 PM  For on call review [email or web address].

## 2021-11-15 NOTE — TOC Progression Note (Signed)
Transition of Care [facility]) - Progression Note    Patient Details  Name: Jeremy Herrera MRN: [id number] Date of Birth: [birth year]  [facility]) CM/SW Contact  Beverly Sessions, RN Phone Number: 11/15/2021, 11:06 AM  Clinical Narrative:    Received notification from [facility] requesting updated progress notes.  I have uploaded and notified in navi portal     Expected Discharge Plan: [place] Barriers to Discharge: Continued Medical Work up  Expected Discharge Plan and Services Expected Discharge Plan: [place]   Discharge Planning Services: CM Consult Post Acute Care Choice: [place] Living arrangements for the past 2 months: Single Family Home                                       Social Determinants of Health (SDOH) Interventions    Readmission Risk Interventions     No data to display

## 2021-11-16 DIAGNOSIS — M25562 Pain in left knee: Secondary | ICD-10-CM | POA: Diagnosis not present

## 2021-11-16 DIAGNOSIS — Z743 Need for continuous supervision: Secondary | ICD-10-CM | POA: Diagnosis not present

## 2021-11-16 DIAGNOSIS — D509 Iron deficiency anemia, unspecified: Secondary | ICD-10-CM | POA: Diagnosis not present

## 2021-11-16 DIAGNOSIS — S72142D Displaced intertrochanteric fracture of left femur, subsequent encounter for closed fracture with routine healing: Secondary | ICD-10-CM | POA: Diagnosis not present

## 2021-11-16 DIAGNOSIS — R1319 Other dysphagia: Secondary | ICD-10-CM | POA: Diagnosis not present

## 2021-11-16 DIAGNOSIS — N390 Urinary tract infection, site not specified: Secondary | ICD-10-CM | POA: Diagnosis not present

## 2021-11-16 DIAGNOSIS — Z9889 Other specified postprocedural states: Secondary | ICD-10-CM | POA: Diagnosis not present

## 2021-11-16 DIAGNOSIS — I2510 Atherosclerotic heart disease of native coronary artery without angina pectoris: Secondary | ICD-10-CM | POA: Diagnosis not present

## 2021-11-16 DIAGNOSIS — I110 Hypertensive heart disease with heart failure: Secondary | ICD-10-CM | POA: Diagnosis not present

## 2021-11-16 DIAGNOSIS — I480 Paroxysmal atrial fibrillation: Secondary | ICD-10-CM | POA: Diagnosis not present

## 2021-11-16 DIAGNOSIS — R279 Unspecified lack of coordination: Secondary | ICD-10-CM | POA: Diagnosis not present

## 2021-11-16 DIAGNOSIS — K5904 Chronic idiopathic constipation: Secondary | ICD-10-CM | POA: Diagnosis not present

## 2021-11-16 DIAGNOSIS — R339 Retention of urine, unspecified: Secondary | ICD-10-CM | POA: Diagnosis not present

## 2021-11-16 DIAGNOSIS — R918 Other nonspecific abnormal finding of lung field: Secondary | ICD-10-CM | POA: Diagnosis not present

## 2021-11-16 DIAGNOSIS — I4891 Unspecified atrial fibrillation: Secondary | ICD-10-CM | POA: Diagnosis not present

## 2021-11-16 DIAGNOSIS — I482 Chronic atrial fibrillation, unspecified: Secondary | ICD-10-CM | POA: Diagnosis not present

## 2021-11-16 DIAGNOSIS — S300XXA Contusion of lower back and pelvis, initial encounter: Secondary | ICD-10-CM | POA: Diagnosis not present

## 2021-11-16 DIAGNOSIS — E440 Moderate protein-calorie malnutrition: Secondary | ICD-10-CM | POA: Diagnosis not present

## 2021-11-16 DIAGNOSIS — J449 Chronic obstructive pulmonary disease, unspecified: Secondary | ICD-10-CM | POA: Diagnosis not present

## 2021-11-16 DIAGNOSIS — W19XXXD Unspecified fall, subsequent encounter: Secondary | ICD-10-CM | POA: Diagnosis not present

## 2021-11-16 DIAGNOSIS — D62 Acute posthemorrhagic anemia: Secondary | ICD-10-CM | POA: Diagnosis not present

## 2021-11-16 DIAGNOSIS — Z7401 Bed confinement status: Secondary | ICD-10-CM | POA: Diagnosis not present

## 2021-11-16 DIAGNOSIS — I950 Idiopathic hypotension: Secondary | ICD-10-CM | POA: Diagnosis not present

## 2021-11-16 DIAGNOSIS — R000 Tachycardia, unspecified: Secondary | ICD-10-CM | POA: Diagnosis not present

## 2021-11-16 DIAGNOSIS — N3000 Acute cystitis without hematuria: Secondary | ICD-10-CM | POA: Diagnosis not present

## 2021-11-16 DIAGNOSIS — M6281 Muscle weakness (generalized): Secondary | ICD-10-CM | POA: Diagnosis not present

## 2021-11-16 DIAGNOSIS — I951 Orthostatic hypotension: Secondary | ICD-10-CM | POA: Diagnosis not present

## 2021-11-16 DIAGNOSIS — E119 Type 2 diabetes mellitus without complications: Secondary | ICD-10-CM | POA: Diagnosis not present

## 2021-11-16 DIAGNOSIS — M79662 Pain in left lower leg: Secondary | ICD-10-CM | POA: Diagnosis not present

## 2021-11-16 DIAGNOSIS — M25552 Pain in left hip: Secondary | ICD-10-CM | POA: Diagnosis not present

## 2021-11-16 DIAGNOSIS — K5909 Other constipation: Secondary | ICD-10-CM | POA: Diagnosis not present

## 2021-11-16 DIAGNOSIS — E782 Mixed hyperlipidemia: Secondary | ICD-10-CM | POA: Diagnosis not present

## 2021-11-16 DIAGNOSIS — I724 Aneurysm of artery of lower extremity: Secondary | ICD-10-CM | POA: Diagnosis not present

## 2021-11-16 DIAGNOSIS — M79605 Pain in left leg: Secondary | ICD-10-CM | POA: Diagnosis present

## 2021-11-16 DIAGNOSIS — J849 Interstitial pulmonary disease, unspecified: Secondary | ICD-10-CM | POA: Diagnosis not present

## 2021-11-16 DIAGNOSIS — R4189 Other symptoms and signs involving cognitive functions and awareness: Secondary | ICD-10-CM | POA: Diagnosis not present

## 2021-11-16 DIAGNOSIS — I509 Heart failure, unspecified: Secondary | ICD-10-CM | POA: Diagnosis not present

## 2021-11-16 DIAGNOSIS — I10 Essential (primary) hypertension: Secondary | ICD-10-CM | POA: Diagnosis not present

## 2021-11-16 DIAGNOSIS — R531 Weakness: Secondary | ICD-10-CM | POA: Diagnosis not present

## 2021-11-16 DIAGNOSIS — I5032 Chronic diastolic (congestive) heart failure: Secondary | ICD-10-CM | POA: Diagnosis not present

## 2021-11-16 DIAGNOSIS — I350 Nonrheumatic aortic (valve) stenosis: Secondary | ICD-10-CM | POA: Diagnosis not present

## 2021-11-16 DIAGNOSIS — F32A Depression, unspecified: Secondary | ICD-10-CM | POA: Diagnosis not present

## 2021-11-16 LAB — BASIC METABOLIC PANEL
Anion gap: 8 (ref 5–15)
BUN: 19 mg/dL (ref 8–23)
CO2: 23 mmol/L (ref 22–32)
Calcium: 8.9 mg/dL (ref 8.9–10.3)
Chloride: 109 mmol/L (ref 98–111)
Creatinine, Ser: 0.91 mg/dL (ref 0.61–1.24)
GFR, Estimated: >60 mL/min (ref >60)
Glucose, Bld: 100 mg/dL — ABNORMAL HIGH (ref 70–99)
Potassium: 4 mmol/L (ref 3.5–5.1)
Sodium: 140 mmol/L (ref 135–145)

## 2021-11-16 LAB — MAGNESIUM: Magnesium: 2 mg/dL (ref 1.7–2.4)

## 2021-11-16 MED ORDER — ENOXAPARIN SODIUM 40 MG/0.4ML IJ SOSY: 40.0000 mg | PREFILLED_SYRINGE | Freq: Every day | INTRAMUSCULAR | 0 refills | Status: DC

## 2021-11-16 MED ORDER — ENSURE ENLIVE PO LIQD: 237.0000 mL | Freq: Two times a day (BID) | ORAL | 12 refills | Status: DC

## 2021-11-16 MED ORDER — TRAMADOL HCL 50 MG PO TABS: 50.0000 mg | ORAL_TABLET | Freq: Four times a day (QID) | ORAL | 0 refills | Status: AC | PRN

## 2021-11-16 MED ORDER — METOPROLOL SUCCINATE ER 25 MG PO TB24: 12.5000 mg | ORAL_TABLET | Freq: Every day | ORAL | Status: DC

## 2021-11-16 MED ORDER — DIGOXIN 250 MCG PO TABS: 0.2500 mg | ORAL_TABLET | Freq: Every day | ORAL | Status: DC

## 2021-11-16 NOTE — Progress Notes (Signed)
RN gave report to [facility], Therapist, sports from [facility]. Awaiting transport.

## 2021-11-16 NOTE — Discharge Summary (Signed)
Physician Discharge Summary   Jeremy Herrera  male DOB: August 26, 1940  ZOX:[id number]  PCP: Marina Goodell, MD  Admit date: 11/10/2021 Discharge date: 11/16/2021  Admitted From: SNF rehab Disposition:  SNF rehab CODE STATUS: DNR  Discharge Instructions     No wound care   Complete by: As directed       Hospital Course:  For full details, please see H&P, progress notes, consult notes and ancillary notes.  Briefly,  Jeremy Herrera is a 81 y.o. Caucasian male with medical history significant for CHF, type 2 diabetes mellitus, hypertension, and BPH, who presented to the ED on 11/10/2021 from [facility]rehab for evaluation after having an episode of unresponsiveness.    He reported feeling very weak all over, but no other symptoms.  Pt had been discharged to rehab on 11/04/2021 after having surgery for left hip fracture repair.  That admission was complicated by A-fib with RVR, hypotension issues and acute blood loss anemia that required vasopressor support and blood transfusion.  He was started on midodrine in order to minimize hypotensive episodes with attempts at HR control with metoprolol.     In the ED, patient was found to be in A-fib with RVR with HR in 110's for the most part.  Vitals also notable for tachypnea.  CXR showed resolving interstitial infiltrates compared to prior.  CT of head  showed no acute findings.   MRI brain was negative for stroke. Carotid duplex U/S showed no hemodynamically significant stenosis.   Echocardiogram with bubble study showed normal EF 60-65% with normal diastolic parameters and mild-mod MR.   * Unresponsiveness Stroke ruled out.  Suspect transient hypotension in the setting of A-fib with RVR and orthostasis.   Closed displaced intertrochanteric fracture of left femur (HCC) S/p IM nail on 10/25/21 Staples removed and steri strips applied during this hospitalization. Follow up with Dr. Allena Katz in 3 weeks after discharge for left femur xrays --2  more weeks of Lovenox for DVT ppx   Chronic Atrial fibrillation with rapid ventricular response (HCC) S/p Watchman device BP stable but soft at times.   Cardiology consulted.  --Added Digoxin 0.25 mg daily --Toprol-XL was reduced 37.5 >> 12.5 mg  --outpatient f/u with Dr. Gwen Pounds in 2 weeks.   Orthostatic hypotension On 9/26, BP was 139/91 lying, and 112/71 standing, with HR increased from 79 to 98.  Even though pt had orthostatic blood pressure drop, standing BP was acceptable, and pt denied dizziness.  Expect orthostasis to improve with better oral intake and more mobility. --cont midodrine 10 mg TID   GERD without esophagitis --Continue PPI   Dyslipidemia --Continue statin   COPD (chronic obstructive pulmonary disease) (HCC) Stable. --cont home bronchodilator regimen as in med list.   Diastolic CHF, chronic (HCC) Appear euvolemic, compensated. Continue metoprolol Hold off home PRN oral Lasix given orthostatic vitals.   Depression --Continue Zoloft   CAD (coronary artery disease) Stable, no active CP. Continue statin, ASA   Discharge Diagnoses:  Principal Problem:   Unresponsiveness Active Problems:   Closed displaced intertrochanteric fracture of left femur (HCC)   Atrial fibrillation with rapid ventricular response (HCC)   CAD (coronary artery disease)   Depression   Diastolic CHF, chronic (HCC)   COPD (chronic obstructive pulmonary disease) (HCC)   Atrial fibrillation, chronic (HCC)   Dyslipidemia   GERD without esophagitis   Orthostatic hypotension   30 Day Unplanned Readmission Risk Score    Flowsheet Row ED to Hosp-Admission (Current) from 11/10/2021 in [facility]  [facility]  30 Day Unplanned Readmission Risk Score (%) 18.31 Filed at 11/16/2021 0801       This score is the patient's risk of an unplanned readmission within 30 days of being discharged (0 -100%). The score is based on dignosis, age, lab data, medications,  orders, and past utilization.   Low:  0-14.9   Medium: 15-21.9   High: 22-29.9   Extreme: 30 and above         Discharge Instructions:  Allergies as of 11/16/2021       Reactions   Hydrocodone-acetaminophen Nausea And Vomiting        Medication List     STOP taking these medications    furosemide 40 MG tablet Commonly known as: LASIX   oxyCODONE 5 MG immediate release tablet Commonly known as: Oxy IR/ROXICODONE       TAKE these medications    albuterol 108 (90 Base) MCG/ACT inhaler Commonly known as: VENTOLIN HFA Inhale 1-2 puffs into the lungs every 4 (four) hours as needed.   aspirin 81 MG tablet Take 81 mg by mouth daily.   bisacodyl 10 MG suppository Commonly known as: DULCOLAX Place 1 suppository (10 mg total) rectally daily as needed for moderate constipation.   cyanocobalamin 1000 MCG tablet Commonly known as: VITAMIN B12 Take 1 tablet (1,000 mcg total) by mouth daily.   digoxin 0.25 MG tablet Commonly known as: LANOXIN Take 1 tablet (0.25 mg total) by mouth daily.   docusate sodium 100 MG capsule Commonly known as: COLACE Take 1 capsule (100 mg total) by mouth 2 (two) times daily as needed for mild constipation.   enoxaparin 40 MG/0.4ML injection Commonly known as: LOVENOX Inject 0.4 mLs (40 mg total) into the skin at bedtime for 14 days.   feeding supplement Liqd Take 237 mLs by mouth 2 (two) times daily between meals.   ferrous sulfate 325 (65 FE) MG tablet Take 325 mg by mouth daily.   Fluticasone-Salmeterol 250-50 MCG/DOSE Aepb Commonly known as: ADVAIR Inhale 1 puff into the lungs in the morning and at bedtime.   methocarbamol 500 MG tablet Commonly known as: ROBAXIN Take 1 tablet (500 mg total) by mouth every 6 (six) hours as needed for muscle spasms.   metoprolol succinate 25 MG 24 hr tablet Commonly known as: TOPROL-XL Take 0.5 tablets (12.5 mg total) by mouth daily. What changed: how much to take   midodrine 10 MG  tablet Commonly known as: PROAMATINE Take 1 tablet (10 mg total) by mouth 3 (three) times daily with meals.   ondansetron 4 MG tablet Commonly known as: ZOFRAN Take 1 tablet (4 mg total) by mouth every 6 (six) hours as needed for nausea.   pantoprazole 40 MG tablet Commonly known as: PROTONIX Take 1 tablet (40 mg total) by mouth 2 (two) times daily before a meal.   Remedy Phytoplex Hydraguard Crea Apply topically.   rosuvastatin 10 MG tablet Commonly known as: CRESTOR Take 1 tablet by mouth daily.   senna 8.6 MG Tabs tablet Commonly known as: SENOKOT Take 1 tablet (8.6 mg total) by mouth daily.   sertraline 25 MG tablet Commonly known as: ZOLOFT Take 25 mg by mouth daily.   simethicone 125 MG chewable tablet Commonly known as: MYLICON Chew 125 mg by mouth every 6 (six) hours as needed.   traMADol 50 MG tablet Commonly known as: ULTRAM Take 1 tablet (50 mg total) by mouth every 6 (six) hours as needed for up to 5  days for moderate pain.   umeclidinium-vilanterol 62.5-25 MCG/INH Aepb Commonly known as: ANORO ELLIPTA Inhale 1 puff into the lungs daily as needed. Takes daily         Follow-up Information     Signa Kell, MD Follow up in 3 week(s).   Specialty: Orthopedic Surgery Contact information: [address] [place] [address] [phone]         Lamar Blinks, MD Follow up in 2 week(s).   Specialty: Cardiology Contact information: [address] [facility] [place] [address] [phone]                 Allergies  Allergen Reactions   Hydrocodone-Acetaminophen Nausea And Vomiting     The results of significant diagnostics from this hospitalization (including imaging, microbiology, ancillary and laboratory) are listed below for reference.   Consultations:   Procedures/Studies: ECHOCARDIOGRAM COMPLETE BUBBLE STUDY  Result Date: 11/11/2021    ECHOCARDIOGRAM REPORT   Patient Name:    Jeremy Herrera Date of Exam: 11/11/2021 Medical Rec #:  [id number]         Height:       74.0 in Accession #:    [id number]        Weight:       205.0 lb Date of Birth:  [birth year]          BSA:          2.197 m Patient Age:    81 years          BP:           112/67 mmHg Patient Gender: M                 HR:           110 bpm. Exam Location:  [facility] Procedure: 2D Echo, Cardiac Doppler, Color Doppler and Saline Contrast Bubble            Study Indications:     Stroke 434.91 / I63.9  History:         Patient has prior history of Echocardiogram examinations, most                  recent 10/27/2021. CHF; Risk Factors:Hypertension and Diabetes.  Sonographer:     Cristela Blue Referring Phys:  [id number] JAN A MANSY Diagnosing Phys: Arnoldo Hooker MD IMPRESSIONS  1. Left ventricular ejection fraction, by estimation, is 60 to 65%. The left ventricle has normal function. The left ventricle has no regional wall motion abnormalities. Left ventricular diastolic parameters were normal.  2. Right ventricular systolic function is normal. The right ventricular size is normal.  3. Left atrial size was moderately dilated.  4. The mitral valve is normal in structure. Mild to moderate mitral valve regurgitation.  5. The aortic valve is normal in structure. Aortic valve regurgitation is trivial. FINDINGS  Left Ventricle: Left ventricular ejection fraction, by estimation, is 60 to 65%. The left ventricle has normal function. The left ventricle has no regional wall motion abnormalities. The left ventricular internal cavity size was normal in size. There is  no left ventricular hypertrophy. Left ventricular diastolic parameters were normal. Right Ventricle: The right ventricular size is normal. No increase in right ventricular wall thickness. Right ventricular systolic function is normal. Left Atrium: Left atrial size was moderately dilated. Right Atrium: Right atrial size was normal in size. Pericardium: There is no evidence of pericardial  effusion. Mitral Valve: The mitral valve is normal in  structure. Mild to moderate mitral valve regurgitation. Tricuspid Valve: The tricuspid valve is normal in structure. Tricuspid valve regurgitation is mild. Aortic Valve: The aortic valve is normal in structure. Aortic valve regurgitation is trivial. Aortic valve mean gradient measures 6.0 mmHg. Aortic valve peak gradient measures 11.1 mmHg. Aortic valve area, by VTI measures 2.15 cm. Pulmonic Valve: The pulmonic valve was normal in structure. Pulmonic valve regurgitation is trivial. Aorta: The aortic root and ascending aorta are structurally normal, with no evidence of dilitation. IAS/Shunts: No atrial level shunt detected by color flow Doppler. Agitated saline contrast was given intravenously to evaluate for intracardiac shunting.  LEFT VENTRICLE PLAX 2D LVIDd:         4.40 cm   Diastology LVIDs:         3.20 cm   LV e' medial:    7.72 cm/s LV PW:         1.80 cm   LV E/e' medial:  11.5 LV IVS:        1.40 cm   LV e' lateral:   10.60 cm/s LVOT diam:     2.20 cm   LV E/e' lateral: 8.4 LV SV:         59 LV SV Index:   27 LVOT Area:     3.80 cm  RIGHT VENTRICLE RV Basal diam:  3.40 cm RV Mid diam:    3.90 cm RV S prime:     12.30 cm/s TAPSE (M-mode): 1.3 cm LEFT ATRIUM             Index        RIGHT ATRIUM           Index LA diam:        5.70 cm 2.60 cm/m   RA Area:     17.80 cm LA Vol (A2C):   94.7 ml 43.11 ml/m  RA Volume:   42.20 ml  19.21 ml/m LA Vol (A4C):   65.4 ml 29.77 ml/m LA Biplane Vol: 80.6 ml 36.69 ml/m  AORTIC VALVE AV Area (Vmax):    1.62 cm AV Area (Vmean):   1.78 cm AV Area (VTI):     2.15 cm AV Vmax:           166.33 cm/s AV Vmean:          110.333 cm/s AV VTI:            0.274 m AV Peak Grad:      11.1 mmHg AV Mean Grad:      6.0 mmHg LVOT Vmax:         71.10 cm/s LVOT Vmean:        51.600 cm/s LVOT VTI:          0.155 m LVOT/AV VTI ratio: 0.57  AORTA Ao Root diam: 4.00 cm MITRAL VALVE               TRICUSPID VALVE MV Area (PHT): 4.29  cm    TR Peak grad:   28.7 mmHg MV Decel Time: 177 msec    TR Vmax:        268.00 cm/s MV E velocity: 89.10 cm/s                            SHUNTS                            Systemic VTI:  0.16 m                            Systemic Diam: 2.20 cm Serafina Royals MD Electronically signed by Serafina Royals MD Signature Date/Time: 11/11/2021/3:13:05 PM    Final    US Carotid Bilateral  Result Date: 11/11/2021 CLINICAL DATA:  81 year old male with a history of syncope EXAM: BILATERAL CAROTID DUPLEX ULTRASOUND TECHNIQUE: Pearline Cables scale imaging, color Doppler and duplex ultrasound were performed of bilateral carotid and vertebral arteries in the neck. COMPARISON:  None Available. FINDINGS: Criteria: Quantification of carotid stenosis is based on velocity parameters that correlate the residual internal carotid diameter with NASCET-based stenosis levels, using the diameter of the distal internal carotid lumen as the denominator for stenosis measurement. The following velocity measurements were obtained: RIGHT ICA:  Systolic 88 cm/sec, Diastolic 21 cm/sec CCA:  85 cm/sec SYSTOLIC ICA/CCA RATIO:  1.0 ECA:  66 cm/sec LEFT ICA:  Systolic 884 cm/sec, Diastolic 35 cm/sec CCA:  78 cm/sec SYSTOLIC ICA/CCA RATIO:  1.4 ECA:  133 cm/sec Right Brachial SBP: Not acquired Left Brachial SBP: Not acquired RIGHT CAROTID ARTERY: No significant calcifications of the right common carotid artery. Intermediate waveform maintained. Heterogeneous and partially calcified plaque at the right carotid bifurcation. No significant lumen shadowing. Low resistance waveform of the right ICA. No significant tortuosity. RIGHT VERTEBRAL ARTERY: Antegrade flow with low resistance waveform. LEFT CAROTID ARTERY: No significant calcifications of the left common carotid artery. Intermediate waveform maintained. Heterogeneous and partially calcified plaque at the left carotid bifurcation without significant lumen shadowing. Low resistance waveform of the left ICA. No  significant tortuosity. LEFT VERTEBRAL ARTERY:  Antegrade flow with low resistance waveform. Irregular rhythm identified during the exam IMPRESSION: Color duplex indicates minimal heterogeneous and calcified plaque, with no hemodynamically significant stenosis by duplex criteria in the extracranial cerebrovascular circulation. Irregular cardiac rhythm identified during the exam. Correlation with any history of atrial fibrillation and/or EKG may be useful Signed, Dulcy Fanny. Nadene Rubins, RPVI [facility] Electronically Signed   By: Corrie Mckusick D.O.   On: 11/11/2021 13:50   MR BRAIN WO CONTRAST  Result Date: 11/10/2021 CLINICAL DATA:  Follow-up examination for stroke. EXAM: MRI HEAD WITHOUT CONTRAST TECHNIQUE: Multiplanar, multiecho pulse sequences of the brain and surrounding structures were obtained without intravenous contrast. COMPARISON:  Prior CT from earlier the same day. FINDINGS: Brain: Mild age-related cerebral atrophy. Patchy T2/FLAIR hyperintensity involving the periventricular deep white matter both cerebral hemispheres as well as the pons, most consistent with chronic small vessel ischemic disease, mild to moderate in nature. No evidence for acute or subacute ischemia. Gray-white matter differentiation maintained. No areas of chronic cortical infarction. No acute intracranial hemorrhage. Single punctate chronic microhemorrhage noted within the left frontal lobe, of doubtful significance in isolation. No mass lesion, midline shift or mass effect. No hydrocephalus or extra-axial fluid collection. Pituitary gland suprasellar region within normal limits. Vascular: Major intracranial vascular flow voids are maintained. Skull and upper cervical spine: Craniocervical junction within normal limits. Bone marrow signal intensity normal. No scalp soft tissue abnormality. Sinuses/Orbits: Prior bilateral ocular lens replacement. Paranasal sinuses are  largely clear. No significant mastoid effusion. Other: None. IMPRESSION: 1. No acute intracranial abnormality. 2. Age-related cerebral atrophy with mild to moderate chronic small vessel ischemic disease. Electronically Signed   By: Jeannine Boga M.D.   On: 11/10/2021 21:37   DG Chest Portable 1 View  Result Date: 11/10/2021 CLINICAL  DATA:  Altered mental status, syncope EXAM: PORTABLE CHEST 1 VIEW COMPARISON:  10/28/2021 FINDINGS: Transverse diameter of heart is increased. There is interval decrease in interstitial markings in both lungs. There is residual prominence of interstitial markings, more so on the left side. No new focal infiltrates are seen. There is no significant pleural effusion or pneumothorax. Right hemidiaphragm is elevated. There is interposition of colon between liver and diaphragm. IMPRESSION: Cardiomegaly. There is partial clearing of interstitial infiltrates suggesting resolving CHF or resolving multifocal pneumonia. No new focal infiltrates are seen. Electronically Signed   By: Ernie Avena M.D.   On: 11/10/2021 16:36   CT HEAD WO CONTRAST ( )  Result Date: 11/10/2021 CLINICAL DATA:  Altered level of consciousness, confusion EXAM: CT HEAD WITHOUT CONTRAST TECHNIQUE: Contiguous axial images were obtained from the base of the skull through the vertex without intravenous contrast. RADIATION DOSE REDUCTION: This exam was performed according to the departmental dose-optimization program which includes automated exposure control, adjustment of the mA and/or kV according to patient size and/or use of iterative reconstruction technique. COMPARISON:  03/31/2018 FINDINGS: Brain: Scattered hypodensities within the periventricular white matter and within the right basal ganglia are compatible with age-indeterminate small vessel ischemic changes. No other signs of acute infarct or hemorrhage. The lateral ventricles and midline structures are otherwise unremarkable. No acute  extra-axial fluid collections. No mass effect. Vascular: Prominent atherosclerosis of the internal carotid arteries. No hyperdense vessel. Skull: Normal. Negative for fracture or focal lesion. Sinuses/Orbits: No acute finding. Other: None. IMPRESSION: 1. Age-indeterminate small vessel ischemic changes within the periventricular white matter and right basal ganglia. 2. Otherwise no acute infarct or hemorrhage. Electronically Signed   By: Sharlet Salina M.D.   On: 11/10/2021 15:51   DG Chest Port 1 View  Result Date: 10/28/2021 CLINICAL DATA:  Shortness of breath, chest pain. EXAM: PORTABLE CHEST 1 VIEW COMPARISON:  October 25, 2021. FINDINGS: Stable cardiomegaly. Increased coarse reticular opacities are noted throughout both lungs concerning for edema or atypical inflammation. Elevated right hemidiaphragm is noted. Bony thorax is unremarkable. IMPRESSION: Increased bilateral lung opacities are noted concerning for edema or atypical inflammation. Electronically Signed   By: Lupita Raider M.D.   On: 10/28/2021 08:31   CT ABDOMEN PELVIS WO CONTRAST  Result Date: 10/27/2021 CLINICAL DATA:  Retroperitoneal bleed suspected eval for retroperitoneal bleed EXAM: CT ABDOMEN AND PELVIS WITHOUT CONTRAST TECHNIQUE: Multidetector CT imaging of the abdomen and pelvis was performed following the standard protocol without IV contrast. RADIATION DOSE REDUCTION: This exam was performed according to the departmental dose-optimization program which includes automated exposure control, adjustment of the mA and/or kV according to patient size and/or use of iterative reconstruction technique. COMPARISON:  CT abdomen pelvis 12/10/2011 trauma x-ray left hip 10/25/2021 FINDINGS: Lower chest: Peribronchovascular consolidation within the visualized lung bases. Peripheral reticulations noted. Left heart Amplatzer device noted. Aortic vascular calcifications. At least 3 vessel coronary artery calcification. Cardiac findings suggestive of  anemia trace left pleural effusion. Hepatobiliary: No focal liver abnormality. No gallstones, gallbladder wall thickening, or pericholecystic fluid. No biliary dilatation. Pancreas: Diffusely atrophic. No focal lesion. Otherwise normal pancreatic contour. No surrounding inflammatory changes. No main pancreatic ductal dilatation. Spleen: Normal in size without focal abnormality. Adrenals/Urinary Tract: No adrenal nodule bilaterally. Bilateral kidneys enhance symmetrically. No hydronephrosis. No hydroureter. The urinary bladder is unremarkable. Stomach/Bowel: Stomach is within normal limits. No evidence of bowel wall thickening or dilatation. Colonic diverticulosis. The appendix is not definitely identified with no inflammatory changes in the right  lower quadrant to suggest acute appendicitis. Vascular/Lymphatic: No abdominal aorta or iliac aneurysm. Moderate to severe atherosclerotic plaque of the aorta and its branches. No abdominal, pelvic, or inguinal lymphadenopathy. Reproductive: Not visualized due to streak artifact originating from bilateral femoral surgical hardware. Other: No intraperitoneal free fluid. No intraperitoneal free gas. No organized fluid collection. No retroperitoneal hematoma formation. Musculoskeletal: No abdominal wall hernia or abnormality. Subcutaneus soft tissue edema and emphysema along the left hip. Left hip 2.5 x 1.8 cm subcutaneus soft tissue hematoma formation. Overlying skin staples. Atrophic degeneration of the right iliopsoas muscle. No suspicious lytic or blastic osseous lesions. No acute displaced fracture. Multilevel degenerative changes of the spine. Total right hip arthroplasty partially visualized. Proximal femur fracture status post intramedullary fixation partially visualized. IMPRESSION: 1. No retroperitoneal hematoma. 2. Colonic diverticulosis with no acute diverticulitis. 3. Cardiac findings suggestive of anemia. 4. Peribronchovascular consolidation within the visualized  lung bases suggestive of acute infection/inflammation. Peripheral reticulations likely representing underlying fibrosis. 5. Trace left pleural effusion. 6.  Aortic Atherosclerosis (ICD10-I70.0). Electronically Signed   By: Tish Frederickson M.D.   On: 10/27/2021 18:09   ECHOCARDIOGRAM COMPLETE  Result Date: 10/27/2021    ECHOCARDIOGRAM REPORT   Patient Name:   Jeremy Herrera Date of Exam: 10/27/2021 Medical Rec #:  [id number]         Height:       73.0 in Accession #:    [id number]        Weight:       215.6 lb Date of Birth:  1940-02-25          BSA:          2.221 m Patient Age:    81 years          BP:           71/61 mmHg Patient Gender: M                 HR:           117 bpm. Exam Location:  [facility] Procedure: 2D Echo, Cardiac Doppler and Color Doppler Indications:     Atrial Fibrillation I 48.91  History:         Patient has prior history of Echocardiogram examinations, most                  recent 05/17/2019. CHF; Risk Factors:Diabetes, Hypertension and                  Dyslipidemia.  Sonographer:     Cristela Blue Referring Phys:  [id number] Sunnie Nielsen Diagnosing Phys: Julien Nordmann MD  Sonographer Comments: Suboptimal apical window and Technically challenging study due to limited acoustic windows. IMPRESSIONS  1. Left ventricular ejection fraction, by estimation, is 60 to 65%. The left ventricle has normal function. The left ventricle has no regional wall motion abnormalities. Left ventricular diastolic parameters are indeterminate.  2. Right ventricular systolic function is normal. The right ventricular size is normal. There is normal pulmonary artery systolic pressure. The estimated right ventricular systolic pressure is 17.7 mmHg.  3. Left atrial size was severely dilated.  4. The mitral valve is normal in structure. No evidence of mitral valve regurgitation. No evidence of mitral stenosis.  5. The aortic valve was not well visualized. There is moderate calcification of the aortic valve. Aortic valve  regurgitation is not visualized. Aortic valve sclerosis is present, with no evidence of aortic valve stenosis. Aortic valve area, by VTI measures 1.29 cm. Aortic  valve mean gradient measures 6.3 mmHg. Aortic valve Vmax measures 1.69 m/s.  6. The inferior vena cava is normal in size with greater than 50% respiratory variability, suggesting right atrial pressure of 3 mmHg. FINDINGS  Left Ventricle: Left ventricular ejection fraction, by estimation, is 60 to 65%. The left ventricle has normal function. The left ventricle has no regional wall motion abnormalities. The left ventricular internal cavity size was normal in size. There is  no left ventricular hypertrophy. Left ventricular diastolic parameters are indeterminate. Right Ventricle: The right ventricular size is normal. No increase in right ventricular wall thickness. Right ventricular systolic function is normal. There is normal pulmonary artery systolic pressure. The tricuspid regurgitant velocity is 1.78 m/s, and  with an assumed right atrial pressure of 5 mmHg, the estimated right ventricular systolic pressure is 17.7 mmHg. Left Atrium: Left atrial size was severely dilated. Right Atrium: Right atrial size was normal in size. Pericardium: There is no evidence of pericardial effusion. Mitral Valve: The mitral valve is normal in structure. No evidence of mitral valve regurgitation. No evidence of mitral valve stenosis. Tricuspid Valve: The tricuspid valve is normal in structure. Tricuspid valve regurgitation is mild . No evidence of tricuspid stenosis. Aortic Valve: The aortic valve was not well visualized. There is moderate calcification of the aortic valve. Aortic valve regurgitation is not visualized. Aortic valve sclerosis is present, with no evidence of aortic valve stenosis. Aortic valve mean gradient measures 6.3 mmHg. Aortic valve peak gradient measures 11.4 mmHg. Aortic valve area, by VTI measures 1.29 cm. Pulmonic Valve: The pulmonic valve was  normal in structure. Pulmonic valve regurgitation is not visualized. No evidence of pulmonic stenosis. Aorta: The aortic root is normal in size and structure. Venous: The inferior vena cava is normal in size with greater than 50% respiratory variability, suggesting right atrial pressure of 3 mmHg. IAS/Shunts: No atrial level shunt detected by color flow Doppler.  LEFT VENTRICLE PLAX 2D LVIDd:         5.00 cm LVIDs:         4.15 cm LV PW:         1.40 cm LV IVS:        1.10 cm LVOT diam:     2.00 cm LV SV:         40 LV SV Index:   18 LVOT Area:     3.14 cm  RIGHT VENTRICLE RV Basal diam:  4.10 cm RV S prime:     11.10 cm/s TAPSE (M-mode): 2.7 cm LEFT ATRIUM            Index        RIGHT ATRIUM           Index LA diam:      5.50 cm  2.48 cm/m   RA Area:     21.90 cm LA Vol (A2C): 104.0 ml 46.82 ml/m  RA Volume:   70.70 ml  31.83 ml/m LA Vol (A4C): 184.0 ml 82.83 ml/m  AORTIC VALVE AV Area (Vmax):    1.15 cm AV Area (Vmean):   1.05 cm AV Area (VTI):     1.29 cm AV Vmax:           169.00 cm/s AV Vmean:          117.667 cm/s AV VTI:            0.307 m AV Peak Grad:      11.4 mmHg AV Mean Grad:  6.3 mmHg LVOT Vmax:         62.00 cm/s LVOT Vmean:        39.400 cm/s LVOT VTI:          0.126 m LVOT/AV VTI ratio: 0.41  AORTA Ao Root diam: 3.60 cm MITRAL VALVE                TRICUSPID VALVE MV Area (PHT): 4.63 cm     TR Peak grad:   12.7 mmHg MV Decel Time: 164 msec     TR Vmax:        178.00 cm/s MV E velocity: 112.00 cm/s                             SHUNTS                             Systemic VTI:  0.13 m                             Systemic Diam: 2.00 cm Julien Nordmann MD Electronically signed by Julien Nordmann MD Signature Date/Time: 10/27/2021/4:19:09 PM    Final    DG HIP UNILAT WITH PELVIS 2-3 VIEWS LEFT  Result Date: 10/25/2021 CLINICAL DATA:  ORIF LEFT femur EXAM: DG HIP (WITH OR WITHOUT PELVIS) 2-3V LEFT COMPARISON:  10/25/2021 Fluoroscopy time: 3 minutes 47 seconds Dose: Not provided; please refer to  operative records Images: 6 FINDINGS: Osseous demineralization. IM nail with 2 compression screws placed across an intertrochanteric fracture LEFT femur. Significant improvement in fracture fragment alignment since previous exam. Distal locking screw present. IMPRESSION: Post nailing of intertrochanteric fracture LEFT femur. Electronically Signed   By: Ulyses Southward M.D.   On: 10/25/2021 18:42   DG C-Arm 1-60 Min-No Report  Result Date: 10/25/2021 Fluoroscopy was utilized by the requesting physician.  No radiographic interpretation.   CT CHEST WO CONTRAST  Result Date: 10/25/2021 CLINICAL DATA:  Fall.  Pleural effusion. EXAM: CT CHEST WITHOUT CONTRAST TECHNIQUE: Multidetector CT imaging of the chest was performed following the standard protocol without IV contrast. RADIATION DOSE REDUCTION: This exam was performed according to the departmental dose-optimization program which includes automated exposure control, adjustment of the mA and/or kV according to patient size and/or use of iterative reconstruction technique. COMPARISON:  Radiograph of same day.  CT scan of Jul 04, 2017. FINDINGS: Cardiovascular: Atherosclerosis of thoracic aorta is noted without aneurysm formation. Aortic valves calcifications are noted. Normal cardiac size. No pericardial effusion. Mild coronary artery calcifications are noted. Mediastinum/Nodes: No enlarged mediastinal or axillary lymph nodes. Thyroid gland, trachea, and esophagus demonstrate no significant findings. Lungs/Pleura: No pneumothorax or pleural effusion is noted. Reticular densities are noted peripherally in both lungs suggesting chronic interstitial lung disease. Interval development of 6 mm nodule seen in left lower lobe best seen on image number 121 of series 3. Multiple patchy airspace opacities are noted in the right lung which may represent multifocal inflammation, or potentially severe fibrosis secondary to chronic scarring or inflammation. Some degree of mosaic  pattern is seen in the left lung suggesting multifocal inflammation or air trapping secondary to small airways disease. Some degree of bronchiectasis is noted in both lower lobes. Upper Abdomen: No acute abnormality. Musculoskeletal: No chest wall mass or suspicious bone lesions identified. IMPRESSION: There is interval development of multiple patchy airspace opacities in the right  lung which may represent multifocal inflammation or pneumonia, or potentially severe masslike fibrosis secondary to chronic scarring or inflammation. Some degree of mosaic pattern is also noted in the left lung suggesting multifocal inflammation or air trapping secondary to small airways disease. There are again noted reticular densities noted peripherally throughout both lungs suggesting chronic interstitial lung disease, as well as some degree of bronchiectasis seen in both lower lobes, most consistent with chronic findings. Interval development of small nodule seen in left lower lobe. Non-contrast chest CT at 6-12 months is recommended. If the nodule is stable at time of repeat CT, then future CT at 18-24 months (from today's scan) is considered optional for low-risk patients, but is recommended for high-risk patients. This recommendation follows the consensus statement: Guidelines for Management of Incidental Pulmonary Nodules Detected on CT Images: From the Fleischner Society 2017; Radiology 2017; 284:228-243. Mild coronary artery calcifications are noted. Aortic valve calcifications are noted. Aortic Atherosclerosis (ICD10-I70.0). Electronically Signed   By: Lupita RaiderJames  Green Jr M.D.   On: 10/25/2021 14:56   DG Chest 1 View  Result Date: 10/25/2021 CLINICAL DATA:  Fall EXAM: CHEST  1 VIEW COMPARISON:  Radiograph 05/16/2019 FINDINGS: Enlarged cardiac silhouette. Appendage occlusion device noted. There is pulmonary vascular congestion with interstitial prominent bilaterally, left greater than right. No pleural effusion. No pneumothorax.  No acute osseous abnormality identified on frontal radiographs of the chest. IMPRESSION: Cardiomegaly with pulmonary vascular congestion and interstitial prominence bilaterally, left greater than right, which could reflect a degree of interstitial edema. Electronically Signed   By: Caprice RenshawJacob  Kahn M.D.   On: 10/25/2021 11:57   DG Hip Unilat With Pelvis 2-3 Views Left  Result Date: 10/25/2021 CLINICAL DATA:  Fall, pain EXAM: DG HIP (WITH OR WITHOUT PELVIS) 2-3V LEFT COMPARISON:  None Available. FINDINGS: Osteopenia. Comminuted, displaced and impacted intratrochanteric fractures of the left hip. Status post right hip total arthroplasty. No displaced fracture or dislocation of the bony pelvis. Nonobstructive pattern of overlying bowel gas. IMPRESSION: 1. Comminuted, displaced and impacted intratrochanteric fractures of the left hip. 2. Status post right hip total arthroplasty. 3. No displaced fracture or dislocation of the bony pelvis. Electronically Signed   By: Jearld LeschAlex D Bibbey M.D.   On: 10/25/2021 11:52      Labs: BNP (last 3 results) Recent Labs    11/12/21 0615  BNP 79.2   Basic Metabolic Panel: Recent Labs  Lab 11/10/21 1534 11/11/21 0914 11/12/21 0615 11/14/21 0432 11/16/21 0619  NA 140 139 139 139 140  K 4.4 3.9 3.9 4.3 4.0  CL 106 106 107 111 109  CO2 24 25 23 25 23   GLUCOSE 123* 103* 99 93 100*  BUN 17 13 17 20 19   CREATININE 0.94 0.95 1.02 0.88 0.91  CALCIUM 9.2 8.7* 8.7* 8.5* 8.9  MG  --   --  1.9 1.8 2.0   Liver Function Tests: Recent Labs  Lab 11/10/21 1534  AST 35  ALT 30  ALKPHOS 226*  BILITOT 1.9*  PROT 6.9  ALBUMIN 3.6   No results for input(s): "LIPASE", "AMYLASE" in the last 168 hours. No results for input(s): "AMMONIA" in the last 168 hours. CBC: Recent Labs  Lab 11/10/21 1534 11/11/21 0914 11/12/21 0615  WBC 8.4 5.7 5.8  HGB 12.6* 11.5* 11.1*  HCT 38.8* 35.7* 33.2*  MCV 95.1 94.7 93.0  PLT 249 241 212   Cardiac Enzymes: No results for input(s):  "CKTOTAL", "CKMB", "CKMBINDEX", "TROPONINI" in the last 168 hours. BNP: Invalid input(s): "POCBNP" CBG:  No results for input(s): "GLUCAP" in the last 168 hours. D-Dimer No results for input(s): "DDIMER" in the last 72 hours. Hgb A1c No results for input(s): "HGBA1C" in the last 72 hours. Lipid Profile No results for input(s): "CHOL", "HDL", "LDLCALC", "TRIG", "CHOLHDL", "LDLDIRECT" in the last 72 hours. Thyroid function studies No results for input(s): "TSH", "T4TOTAL", "T3FREE", "THYROIDAB" in the last 72 hours.  Invalid input(s): "FREET3" Anemia work up No results for input(s): "VITAMINB12", "FOLATE", "FERRITIN", "TIBC", "IRON", "RETICCTPCT" in the last 72 hours. Urinalysis    Component Value Date/Time   COLORURINE YELLOW (A) 11/10/2021 1534   APPEARANCEUR CLEAR (A) 11/10/2021 1534   APPEARANCEUR Clear 04/02/2013 1415   LABSPEC 1.015 11/10/2021 1534   LABSPEC 1.015 04/02/2013 1415   PHURINE 5.5 11/10/2021 1534   GLUCOSEU NEGATIVE 11/10/2021 1534   GLUCOSEU Negative 04/02/2013 1415   HGBUR NEGATIVE 11/10/2021 1534   BILIRUBINUR NEGATIVE 11/10/2021 1534   BILIRUBINUR Negative 04/02/2013 1415   KETONESUR NEGATIVE 11/10/2021 1534   PROTEINUR NEGATIVE 11/10/2021 1534   NITRITE NEGATIVE 11/10/2021 1534   LEUKOCYTESUR NEGATIVE 11/10/2021 1534   LEUKOCYTESUR Negative 04/02/2013 1415   Sepsis Labs Recent Labs  Lab 11/10/21 1534 11/11/21 0914 11/12/21 0615  WBC 8.4 5.7 5.8   Microbiology Recent Results (from the past 240 hour(s))  Resp Panel by RT-PCR (Flu A&B, Covid) Anterior Nasal Swab     Status: None   Collection Time: 11/10/21  3:34 PM   Specimen: Anterior Nasal Swab  Result Value Ref Range Status   SARS Coronavirus 2 by RT PCR NEGATIVE NEGATIVE Final    Comment: (NOTE) SARS-CoV-2 target nucleic acids are NOT DETECTED.  The SARS-CoV-2 RNA is generally detectable in upper respiratory specimens during the acute phase of infection. The lowest concentration of  SARS-CoV-2 viral copies this assay can detect is 138 copies/mL. A negative result does not preclude SARS-Cov-2 infection and should not be used as the sole basis for treatment or other patient management decisions. A negative result may occur with  improper specimen collection/handling, submission of specimen other than nasopharyngeal swab, presence of viral mutation(s) within the areas targeted by this assay, and inadequate number of viral copies(<138 copies/mL). A negative result must be combined with clinical observations, patient history, and epidemiological information. The expected result is Negative.  Fact Sheet for Patients:  BloggerCourse.com  Fact Sheet for Healthcare Providers:  SeriousBroker.it  This test is no t yet approved or cleared by the [place] FDA and  has been authorized for detection and/or diagnosis of SARS-CoV-2 by FDA under an Emergency Use Authorization (EUA). This EUA will remain  in effect (meaning this test can be used) for the duration of the COVID-19 declaration under Section 564(b)(1) of the Act, 21 U.S.C.section 360bbb-3(b)(1), unless the authorization is terminated  or revoked sooner.       Influenza A by PCR NEGATIVE NEGATIVE Final   Influenza B by PCR NEGATIVE NEGATIVE Final    Comment: (NOTE) The Xpert Xpress SARS-CoV-2/FLU/RSV plus assay is intended as an aid in the diagnosis of influenza from Nasopharyngeal swab specimens and should not be used as a sole basis for treatment. Nasal washings and aspirates are unacceptable for Xpert Xpress SARS-CoV-2/FLU/RSV testing.  Fact Sheet for Patients: BloggerCourse.com  Fact Sheet for Healthcare Providers: SeriousBroker.it  This test is not yet approved or cleared by the [place] FDA and has been authorized for detection and/or diagnosis of SARS-CoV-2 by FDA under an Emergency Use  Authorization (EUA). This EUA will remain in effect (  meaning this test can be used) for the duration of the COVID-19 declaration under Section 564(b)(1) of the Act, 21 U.S.C. section 360bbb-3(b)(1), unless the authorization is terminated or revoked.  Performed at [facility], [address] [place], [place] [address]      Total time spend on discharging this patient, including the last patient exam, discussing the hospital stay, instructions for ongoing care as it relates to all pertinent caregivers, as well as preparing the medical discharge records, prescriptions, and/or referrals as applicable, is 35 minutes.    Darlin Priestly, MD  [facility] 11/16/2021, 10:11 AM

## 2021-11-16 NOTE — TOC Transition Note (Signed)
Transition of Care [facility]) - CM/SW Discharge Note   Patient Details  Name: Jeremy Herrera MRN: [id number] Date of Birth: [birth year]  Transition of Care [facility] CM/SW Contact:  Jeremy Sessions, RN Phone Number: 11/16/2021, 10:46 AM   Clinical Narrative:     Patient will DC to: [facility] Anticipated DC date:. 11/16/21  Family notified: VM left for daughter Ms [facility] [employer] [employer] byJohnanna Schneiders  Per MD patient ready for DC to . RN, patient, patient's family, and facility notified of DC. Discharge Summary sent to facility. RN given number for report. DC packet on chart. Ambulance transport requested for patient.  TOC signing off.  Jeremy Herrera [facility] [phone]     Barriers to Discharge: Continued Medical Work up   Patient Goals and CMS Choice Patient states their goals for this hospitalization and ongoing recovery are:: SNF for short term rehab CMS Medicare.gov Compare Post Acute Care list provided to:: Patient Represenative (must comment) Jeremy Herrera, daughter) Choice offered to / list presented to : Adult Children  Discharge Placement                       Discharge Plan and Services   Discharge Planning Services: CM Consult Post Acute Care Choice: Skilled Nursing Facility                               Social Determinants of Health (SDOH) Interventions     Readmission Risk Interventions     No data to display

## 2021-11-16 NOTE — Progress Notes (Addendum)
Patient is alert and oriented x4.. Obtained orthostatic v/s. Patient has a hard time standing up on legs for more than 5 minutes. Gets dyspnea w/exertion. Had a bowel movement in bed-linen changed. Purewick changed. Patient states that he has bowel and bladder incontinence. He denied pain. Previous night had left leg cramping and received Robaxin for muscle cramps. Refused to brush teeth but it was offered. Patient denied additional needs.

## 2021-11-17 ENCOUNTER — Emergency Department: Payer: Medicare PPO

## 2021-11-17 ENCOUNTER — Emergency Department
Admission: EM | Admit: 2021-11-17 | Discharge: 2021-11-17 | Disposition: A | Discharge status: Home / Self Care | Payer: Medicare PPO | Attending: Emergency Medicine | Admitting: Emergency Medicine

## 2021-11-17 ENCOUNTER — Other Ambulatory Visit: Payer: Self-pay

## 2021-11-17 DIAGNOSIS — M79605 Pain in left leg: Secondary | ICD-10-CM | POA: Insufficient documentation

## 2021-11-17 DIAGNOSIS — E119 Type 2 diabetes mellitus without complications: Secondary | ICD-10-CM | POA: Insufficient documentation

## 2021-11-17 DIAGNOSIS — I110 Hypertensive heart disease with heart failure: Secondary | ICD-10-CM | POA: Insufficient documentation

## 2021-11-17 DIAGNOSIS — M79662 Pain in left lower leg: Secondary | ICD-10-CM | POA: Diagnosis not present

## 2021-11-17 DIAGNOSIS — R339 Retention of urine, unspecified: Secondary | ICD-10-CM | POA: Insufficient documentation

## 2021-11-17 DIAGNOSIS — S300XXA Contusion of lower back and pelvis, initial encounter: Secondary | ICD-10-CM | POA: Diagnosis not present

## 2021-11-17 DIAGNOSIS — M25552 Pain in left hip: Secondary | ICD-10-CM | POA: Diagnosis not present

## 2021-11-17 DIAGNOSIS — I724 Aneurysm of artery of lower extremity: Secondary | ICD-10-CM | POA: Diagnosis not present

## 2021-11-17 DIAGNOSIS — I509 Heart failure, unspecified: Secondary | ICD-10-CM | POA: Insufficient documentation

## 2021-11-17 DIAGNOSIS — I4891 Unspecified atrial fibrillation: Secondary | ICD-10-CM | POA: Diagnosis present

## 2021-11-17 DIAGNOSIS — N3000 Acute cystitis without hematuria: Secondary | ICD-10-CM

## 2021-11-17 DIAGNOSIS — R918 Other nonspecific abnormal finding of lung field: Secondary | ICD-10-CM | POA: Diagnosis not present

## 2021-11-17 LAB — CBC WITH DIFFERENTIAL/PLATELET
Abs Immature Granulocytes: 0.03 10*3/uL (ref 0.00–0.07)
Basophils Absolute: 0.1 10*3/uL (ref 0.0–0.1)
Basophils Relative: 1 %
Eosinophils Absolute: 0.1 10*3/uL (ref 0.0–0.5)
Eosinophils Relative: 1 %
HCT: 39.2 % (ref 39.0–52.0)
Hemoglobin: 12.7 g/dL — ABNORMAL LOW (ref 13.0–17.0)
Immature Granulocytes: 0 %
Lymphocytes Relative: 22 %
Lymphs Abs: 1.6 10*3/uL (ref 0.7–4.0)
MCH: 31 pg (ref 26.0–34.0)
MCHC: 32.4 g/dL (ref 30.0–36.0)
MCV: 95.6 fL (ref 80.0–100.0)
Monocytes Absolute: 0.7 10*3/uL (ref 0.1–1.0)
Monocytes Relative: 10 %
Neutro Abs: 4.7 10*3/uL (ref 1.7–7.7)
Neutrophils Relative %: 66 %
Platelets: 175 10*3/uL (ref 150–400)
RBC: 4.1 MIL/uL — ABNORMAL LOW (ref 4.22–5.81)
RDW: 16.1 % — ABNORMAL HIGH (ref 11.5–15.5)
WBC: 7.1 10*3/uL (ref 4.0–10.5)
nRBC: 0 % (ref 0.0–0.2)

## 2021-11-17 LAB — CK: Total CK: 12 U/L — ABNORMAL LOW (ref 49–397)

## 2021-11-17 LAB — URINALYSIS, COMPLETE (UACMP) WITH MICROSCOPIC
Bacteria, UA: NONE SEEN
Bilirubin Urine: NEGATIVE
Glucose, UA: NEGATIVE mg/dL
Ketones, ur: NEGATIVE mg/dL
Leukocytes,Ua: NEGATIVE
Nitrite: NEGATIVE
Protein, ur: NEGATIVE mg/dL
Specific Gravity, Urine: >1.046 — ABNORMAL HIGH (ref 1.005–1.030)
pH: 5 (ref 5.0–8.0)

## 2021-11-17 LAB — COMPREHENSIVE METABOLIC PANEL
ALT: 19 U/L (ref 0–44)
AST: 33 U/L (ref 15–41)
Albumin: 3.8 g/dL (ref 3.5–5.0)
Alkaline Phosphatase: 214 U/L — ABNORMAL HIGH (ref 38–126)
Anion gap: 8 (ref 5–15)
BUN: 19 mg/dL (ref 8–23)
CO2: 23 mmol/L (ref 22–32)
Calcium: 9.1 mg/dL (ref 8.9–10.3)
Chloride: 108 mmol/L (ref 98–111)
Creatinine, Ser: 1.04 mg/dL (ref 0.61–1.24)
GFR, Estimated: >60 mL/min (ref >60)
Glucose, Bld: 115 mg/dL — ABNORMAL HIGH (ref 70–99)
Potassium: 4.1 mmol/L (ref 3.5–5.1)
Sodium: 139 mmol/L (ref 135–145)
Total Bilirubin: 1.6 mg/dL — ABNORMAL HIGH (ref 0.3–1.2)
Total Protein: 7 g/dL (ref 6.5–8.1)

## 2021-11-17 LAB — LACTIC ACID, PLASMA: Lactic Acid, Venous: 1 mmol/L (ref 0.5–1.9)

## 2021-11-17 LAB — PROTIME-INR
INR: 1.2 (ref 0.8–1.2)
Prothrombin Time: 14.9 seconds (ref 11.4–15.2)

## 2021-11-17 LAB — DIGOXIN LEVEL: Digoxin Level: 0.5 ng/mL — ABNORMAL LOW (ref 0.8–2.0)

## 2021-11-17 LAB — APTT: aPTT: 34 seconds (ref 24–36)

## 2021-11-17 MED ORDER — CEFDINIR 300 MG PO CAPS: 300.0000 mg | ORAL_CAPSULE | Freq: Two times a day (BID) | ORAL | 0 refills | Status: AC

## 2021-11-17 MED ORDER — KETOROLAC TROMETHAMINE 30 MG/ML IJ SOLN
15.0000 mg | Freq: Once | INTRAMUSCULAR | Status: AC
  Administered 2021-11-17: 15 mg via INTRAVENOUS
  Filled 2021-11-17: qty 1

## 2021-11-17 MED ORDER — MIDODRINE HCL 5 MG PO TABS
10.0000 mg | ORAL_TABLET | Freq: Three times a day (TID) | ORAL | Status: DC
  Administered 2021-11-17 (×3): 10 mg via ORAL
  Filled 2021-11-17 (×3): qty 2

## 2021-11-17 MED ORDER — TRAMADOL HCL 50 MG PO TABS
50.0000 mg | ORAL_TABLET | Freq: Once | ORAL | Status: AC
  Administered 2021-11-17: 50 mg via ORAL
  Filled 2021-11-17: qty 1

## 2021-11-17 MED ORDER — ONDANSETRON HCL 4 MG/2ML IJ SOLN
4.0000 mg | INTRAMUSCULAR | Status: AC
  Administered 2021-11-17: 4 mg via INTRAVENOUS
  Filled 2021-11-17: qty 2

## 2021-11-17 MED ORDER — MORPHINE SULFATE (PF) 4 MG/ML IV SOLN
4.0000 mg | Freq: Once | INTRAVENOUS | Status: AC
  Administered 2021-11-17: 4 mg via INTRAVENOUS
  Filled 2021-11-17: qty 1

## 2021-11-17 MED ORDER — IOHEXOL 350 MG/ML SOLN
100.0000 mL | Freq: Once | INTRAVENOUS | Status: AC | PRN
  Administered 2021-11-17: 100 mL via INTRAVENOUS

## 2021-11-17 MED ORDER — METOPROLOL TARTRATE 25 MG PO TABS
12.5000 mg | ORAL_TABLET | Freq: Once | ORAL | Status: AC
  Administered 2021-11-17: 12.5 mg via ORAL
  Filled 2021-11-17: qty 1

## 2021-11-17 MED ORDER — SODIUM CHLORIDE 0.9 % IV SOLN
2.0000 g | Freq: Once | INTRAVENOUS | Status: AC
  Administered 2021-11-17: 2 g via INTRAVENOUS
  Filled 2021-11-17: qty 20

## 2021-11-17 MED ORDER — LACTATED RINGERS IV BOLUS (SEPSIS)
1000.0000 mL | Freq: Once | INTRAVENOUS | Status: AC
  Administered 2021-11-17: 1000 mL via INTRAVENOUS

## 2021-11-17 MED ORDER — CEFDINIR 300 MG PO CAPS: 300.0000 mg | ORAL_CAPSULE | Freq: Two times a day (BID) | ORAL | 0 refills | Status: DC

## 2021-11-17 MED ORDER — SODIUM CHLORIDE 0.9 % IV BOLUS
500.0000 mL | Freq: Once | INTRAVENOUS | Status: AC
  Administered 2021-11-17: 500 mL via INTRAVENOUS

## 2021-11-17 NOTE — ED Notes (Signed)
Provider notified of bladder scan results. No new orders received.

## 2021-11-17 NOTE — ED Notes (Signed)
Repeat EKG completed and given to Dr. Ellender Hose for review

## 2021-11-17 NOTE — ED Triage Notes (Signed)
Arrived via EMS from [facility]. Reports recent left hip surgery and new onset of severe left hip pain. Denies recent fall. AOX4, grimacing in pain. Resp even, unlabored on RA.

## 2021-11-17 NOTE — Discharge Instructions (Addendum)
Start the antibiotic as prescribed  Encourage fluids/hydration  Continue meds otherwise as on discharge summary

## 2021-11-17 NOTE — ED Notes (Addendum)
As per Dr. Francine Graven, "Please talk with Dr. Ellender Hose about him, for now." Dr. Ellender Hose notified and order received for NS

## 2021-11-17 NOTE — ED Notes (Signed)
Dr. Francine Graven notified of BP of 92/63 via secure chat, as it is low, but the MAP is still above 65 and systolic above 90

## 2021-11-17 NOTE — ED Provider Notes (Signed)
[facility] Provider Note    Event Date/Time   First MD Initiated Contact with Patient 11/17/21 [phone]     (approximate)   History   Left Hip Pain   HPI  Jeremy Herrera is a 81 y.o. male who presents from his nursing facility for evaluation of a cute onset and severe left leg pain.  He had fourth Bittick surgery on his left femur about 4 weeks ago after a fall that resulted in a fracture.  He was discharged but then came back after a syncopal episode of unclear origin.  He has then been discharged to a skilled nursing facility ([facility]).  He said that he has been doing okay but that starting last night he had acute onset of severe left leg pain that is essentially all throughout the leg.  Sometimes it is in the upper part of his leg and sometimes it is down in the left lower part of his leg below his knee.  He is unaware of any swelling.  He can move his leg and sometimes it feels like severe cramps and other times it is simply sharp pain.  The facility told the paramedics that the patient has not had a fall, and the patient also confirms that he has had no recent trauma.  He denies pain anywhere else including chest and abdomen.  He is denying shortness of breath even though he is breathing rapidly.  He denies feeling febrile.     Physical Exam   Triage Vital Signs: ED Triage Vitals  Enc Vitals Group     BP 11/17/21 0523 119/75     Pulse Rate 11/17/21 0523 (!) 118     Resp 11/17/21 0523 (!) 24     Temp 11/17/21 0523 98.4 F (36.9 C)     Temp src --      SpO2 11/17/21 0523 97 %     Weight 11/17/21 0525 93 kg (205 lb)     Height 11/17/21 0525 1.88 m (6\' 2" )     Head Circumference --      Peak Flow --      Pain Score 11/17/21 0524 10     Pain Loc --      Pain Edu? --      Excl. in [place]? --     Most recent vital signs: Vitals:   11/17/21 0711 11/17/21 0713  BP:  121/66  Pulse:  (!) 102  Resp:  19  Temp:    SpO2: 97% 97%      General: Awake, alert.  Elderly and in distress from pain in his left leg. CV:  Good peripheral perfusion.  Patient has warm lower extremities, normal capillary refill in both feet, and I believe I can faintly feel a left dorsalis pedis pulse (the affected extremity). Resp:  Normal effort.  Tachypnea, possibly due to pain.  Lungs are clear to auscultation. Abd:  No distention.  No tenderness to palpation of the abdomen. 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  Difficult to assess range of motion because of the pain and occasional cramps the patient seems to be experiencing in the left lower leg.  However his compartments are soft and easily compressible although he yells in pain when I palpate both the upper and lower part of his left lower extremity.  No peripheral edema.   ED Results / Procedures / Treatments   Labs (all  labs ordered are listed, but only abnormal results are displayed) Labs Reviewed  COMPREHENSIVE METABOLIC PANEL - Abnormal; Notable for the following components:      Result Value   Glucose, Bld 115 (*)    Alkaline Phosphatase 214 (*)    Total Bilirubin 1.6 (*)    All other components within normal limits  CBC WITH DIFFERENTIAL/PLATELET - Abnormal; Notable for the following components:   RBC 4.10 (*)    Hemoglobin 12.7 (*)    RDW 16.1 (*)    All other components within normal limits  URINE CULTURE  CULTURE, BLOOD (SINGLE)  LACTIC ACID, PLASMA  PROTIME-INR  APTT  URINALYSIS, COMPLETE (UACMP) WITH MICROSCOPIC     EKG  ED ECG REPORT I, Hinda Kehr, the attending physician, personally viewed and interpreted this ECG.  Date: 11/17/2021 EKG Time: 5:26 AM Rate: 128 Rhythm: A-fib with RVR QRS Axis: normal Intervals: Abnormal due to A-fib. ST/T Wave abnormalities: Non-specific ST segment / T-wave changes, but no clear evidence of acute ischemia, possibly some rate  related ST segment depression most notable in leads V4 Narrative Interpretation: no definitive evidence of acute ischemia; does not meet STEMI criteria.    RADIOLOGY I viewed and interpreted the patient's 1 view chest x-ray.  There are some opacities that were initially concerning.  However, the radiologist reported that there actually decreased from prior and with improved ventilation.  There is no evidence of an acute abnormality at this time.  Venous ultrasound with Doppler pending at the time of transfer of care.    PROCEDURES:  Critical Care performed: No  Procedures   MEDICATIONS ORDERED IN ED: Medications  lactated ringers bolus 1,000 mL (0 mLs Intravenous Stopped 11/17/21 0710)  morphine (PF) 4 MG/ML injection 4 mg (4 mg Intravenous Given 11/17/21 0545)  ketorolac (TORADOL) 30 MG/ML injection 15 mg (15 mg Intravenous Given 11/17/21 0543)  ondansetron (ZOFRAN) injection 4 mg (4 mg Intravenous Given 11/17/21 0542)  iohexol (OMNIPAQUE) 350 MG/ML injection 100 mL (100 mLs Intravenous Contrast Given 11/17/21 0735)     IMPRESSION / MDM / ASSESSMENT AND PLAN / ED COURSE  I reviewed the triage vital signs and the nursing notes.                              Differential diagnosis includes, but is not limited to, DVT, arterial occlusion, muscle spasms/cramps, postoperative infection, A-fib with RVR, electrolyte or metabolic abnormality.  Patient's presentation is most consistent with acute presentation with potential threat to life or bodily function.  The patient is on the cardiac monitor to evaluate for evidence of arrhythmia and/or significant heart rate changes.  Patient arrives in A-fib, which is chronic, but with a rate of about 120 and with tachypnea.  I believe that at least the tachypnea is the result of the pain he is experiencing, and possibly the RVR as well, but I cannot exclude sepsis at this time.  I am initiating a "possible sepsis" work-up with the following  labs/studies: CBC with differential, pro time-INR, lactic acid, single blood culture, APTT, comprehensive metabolic panel, urinalysis, urine culture, lactic acid, 1 view chest x-ray.  I am also concerned about the possibility that the patient may have a venous thrombosis in his left lower extremity given that he is postoperative from orthopedic surgery with minimal mobility, chronic A-fib, and no anticoagulation other than aspirin.  He may need additional imaging, I started by ordering an ultrasound of the  left lower extremity with Doppler.  Of note, the patient reports that in the past people tried to catheterize him and it resulted in a great deal of trauma.  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  At this point I do not want to risk additional injury to him by insisting upon an in and out catheterization.  A pure wick is in place to collect urine but I have minimal concern for urinary tract infection as the cause of this acute leg pain.   Clinical Course as of 11/17/21 0808  Thu Nov 17, 2021  0601 CBC is essentially normal including no leukocytosis and no clinically significant anemia.  CMP is notable for a slight elevation of alkaline phosphatase and total bilirubin but is unlikely to be clinically significant. [CF]  0715 Discussed case in person with Dr. Ellender Hose who is assuming care of the patient in the emergency department.  My recommendation was to obtain a CTA aortobifemoral scan to verify that there are no arterial occlusions accounting for the patient's pain that seems to be out of proportion to exam.  This will also identify any soft tissue abnormalities that may be present.  Transferring ED care at this time. [CF]    Clinical Course User Index [CF] Hinda Kehr, MD     FINAL CLINICAL IMPRESSION(S) / ED DIAGNOSES   Final diagnoses:  Left leg pain  Atrial fibrillation with RVR ([place])     Rx / DC Orders   ED Discharge Orders     None        Note:   This document was prepared using Dragon voice recognition software and may include unintentional dictation errors.   Hinda Kehr, MD 11/17/21 [phone]

## 2021-11-17 NOTE — ED Provider Notes (Addendum)
Assumed care from Dr. Karma Greaser at 7 AM. Briefly, the patient is a 81 y.o. male with PMHx of  has a past medical history of Anemia, BPH (benign prostatic hyperplasia), CHF (congestive heart failure) ([place]), Diabetes mellitus without complication ([place]), Dysphagia, GERD (gastroesophageal reflux disease), HLD (hyperlipidemia), HTN (hypertension), Irregular heart beat, Nocturia, and Urinary retention. here with leg pain, weakness.   Labs Reviewed  COMPREHENSIVE METABOLIC PANEL - Abnormal; Notable for the following components:      Result Value   Glucose, Bld 115 (*)    Alkaline Phosphatase 214 (*)    Total Bilirubin 1.6 (*)    All other components within normal limits  CBC WITH DIFFERENTIAL/PLATELET - Abnormal; Notable for the following components:   RBC 4.10 (*)    Hemoglobin 12.7 (*)    RDW 16.1 (*)    All other components within normal limits  URINE CULTURE  CULTURE, BLOOD (SINGLE)  LACTIC ACID, PLASMA  PROTIME-INR  APTT  URINALYSIS, COMPLETE (UACMP) WITH MICROSCOPIC  CK  DIGOXIN LEVEL    Course of Care: Reviewed CT angio.  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  This does not necessarily correlate with the area of his pain as this is slightly distal to it.  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  This is on the side of his injury but again would not necessarily fit with his symptoms.  He does have a subcutaneous pelvic hematoma, doubt this is causing any kind of mass effect or neuropathy.  Patient appears generally unwell, tachypneic, tachycardic, with heart rate in the 120s to 130s due to his discomfort.  He has a history of difficult to control A-fib and was started on digoxin as well as midodrine for hypotension during his stay.  Do not feel he is stable for discharge back to his facility given ongoing pain, A-fib with RVR, and need for close titration of this.  Will consult medicine.  Addendum:  While awaiting admission, patient was given his p.o. midodrine as well as metoprolol.  He demonstrated good effect with this, and heart rate and blood pressure are now significantly improved.  His leg pain has improved.  I reviewed his labs which are overall reassuring and at his baseline.  His leg pain is resolved.  Discussed the CT scan with vascular surgery, he does not feel any acute abnormality explains his discomfort and as mentioned, his pain has resolved.  Hospitalist reviewed records and feels that he is likely near his baseline.  Chest x-ray is clear.  Patient did have some issues with urinary retention but was able to void spontaneously here.  We will follow-up with urinalysis but suspect he can be discharged back to his skilled nursing facility as he was just cleared for this.  UA returned with possible UTI. He is HDS. VS are stable. He is at his baseline compared to where he was discharged yesterday, will send back to SNF with omnicef, outpt follow-up and good return precautions.    Duffy Bruce, MD 11/17/21 3734    Duffy Bruce, MD 11/17/21 [phone]

## 2021-11-17 NOTE — Progress Notes (Signed)
Called by ER provider to admit this patient for rapid A-fib. Was discharged from the hospital on 11/16/21 after hospitalization for A-fib with rapid ventricular rate, hypotension and acute blood loss anemia that required vaso pressor support as well as blood transfusion. He was discharged to the skilled nursing facility on digoxin and low-dose metoprolol for rate control as well as midodrine for his hypotension. Patient was seen and examined in the ER and during my evaluation his rate was controlled.  He had received metoprolol, and digoxin in the ER as well as an IV fluid bolus with improvement in his blood pressure. Discussed with the ER physician and we will hold off on admission at this time since patient's acute issues have resolved. Please reconsult as needed

## 2021-11-17 NOTE — ED Notes (Signed)
Patient encouraged to urinate. Pt has collection bag to suction on.

## 2021-11-17 NOTE — ED Notes (Signed)
Report and hand off of care given to Amy, C. RN

## 2021-11-17 NOTE — ED Notes (Signed)
Repeat bladder scan completed, as per Dr. Ellender Hose. Bladder scan showed 333mL

## 2021-11-17 NOTE — ED Notes (Signed)
As per Dr. Ellender Hose, repeat Lactic acid is not needed.

## 2021-11-17 NOTE — ED Notes (Signed)
Pt states being in the ED due to left hip pain. Pt falling back asleep. No apparent distress noted. Pt on cardiac, bp and pulse ox monitor. Call bell within reach.

## 2021-11-17 NOTE — ED Notes (Signed)
Ariel, RN and this RN attempted to completed in and out catheter for urine sample. Bruising to the groin noted. Previous RN also informed me of this. Dark purple in color. Pt reports enlarged prostate. This RN was unable to advance the catheter and obtain urine sample. EDP was notified.

## 2021-11-17 NOTE — ED Notes (Signed)
Pt has not urinated yet.  Dr. Ellender Hose notified.

## 2021-11-17 NOTE — ED Notes (Signed)
Resumed care from [facility] [employer].  Pt alert.  Afib on monitor.

## 2021-11-17 NOTE — ED Notes (Signed)
Called [facility] for transport to [facility]

## 2021-11-17 NOTE — ED Notes (Signed)
Bladder scan completed. Bladder scan showed 279mL of urine. Male pure wick on and pt stated he will try to urinate.

## 2021-11-18 DIAGNOSIS — I951 Orthostatic hypotension: Secondary | ICD-10-CM | POA: Diagnosis not present

## 2021-11-18 DIAGNOSIS — D509 Iron deficiency anemia, unspecified: Secondary | ICD-10-CM | POA: Diagnosis not present

## 2021-11-18 DIAGNOSIS — M6281 Muscle weakness (generalized): Secondary | ICD-10-CM | POA: Diagnosis not present

## 2021-11-18 DIAGNOSIS — K5904 Chronic idiopathic constipation: Secondary | ICD-10-CM | POA: Diagnosis not present

## 2021-11-18 DIAGNOSIS — S72142D Displaced intertrochanteric fracture of left femur, subsequent encounter for closed fracture with routine healing: Secondary | ICD-10-CM | POA: Diagnosis not present

## 2021-11-18 DIAGNOSIS — K5909 Other constipation: Secondary | ICD-10-CM | POA: Diagnosis not present

## 2021-11-18 DIAGNOSIS — I5032 Chronic diastolic (congestive) heart failure: Secondary | ICD-10-CM | POA: Diagnosis not present

## 2021-11-18 DIAGNOSIS — J449 Chronic obstructive pulmonary disease, unspecified: Secondary | ICD-10-CM | POA: Diagnosis not present

## 2021-11-18 DIAGNOSIS — R1319 Other dysphagia: Secondary | ICD-10-CM | POA: Diagnosis not present

## 2021-11-19 LAB — URINE CULTURE: Culture: 20000 — AB

## 2021-11-21 DIAGNOSIS — S72142D Displaced intertrochanteric fracture of left femur, subsequent encounter for closed fracture with routine healing: Secondary | ICD-10-CM | POA: Diagnosis not present

## 2021-11-21 DIAGNOSIS — M6281 Muscle weakness (generalized): Secondary | ICD-10-CM | POA: Diagnosis not present

## 2021-11-21 DIAGNOSIS — R1319 Other dysphagia: Secondary | ICD-10-CM | POA: Diagnosis not present

## 2021-11-21 DIAGNOSIS — I5032 Chronic diastolic (congestive) heart failure: Secondary | ICD-10-CM | POA: Diagnosis not present

## 2021-11-21 DIAGNOSIS — K5904 Chronic idiopathic constipation: Secondary | ICD-10-CM | POA: Diagnosis not present

## 2021-11-21 DIAGNOSIS — I951 Orthostatic hypotension: Secondary | ICD-10-CM | POA: Diagnosis not present

## 2021-11-21 DIAGNOSIS — K5909 Other constipation: Secondary | ICD-10-CM | POA: Diagnosis not present

## 2021-11-21 DIAGNOSIS — D509 Iron deficiency anemia, unspecified: Secondary | ICD-10-CM | POA: Diagnosis not present

## 2021-11-21 DIAGNOSIS — J449 Chronic obstructive pulmonary disease, unspecified: Secondary | ICD-10-CM | POA: Diagnosis not present

## 2021-11-22 DIAGNOSIS — I5032 Chronic diastolic (congestive) heart failure: Secondary | ICD-10-CM | POA: Diagnosis not present

## 2021-11-22 DIAGNOSIS — M6281 Muscle weakness (generalized): Secondary | ICD-10-CM | POA: Diagnosis not present

## 2021-11-22 DIAGNOSIS — R1319 Other dysphagia: Secondary | ICD-10-CM | POA: Diagnosis not present

## 2021-11-22 DIAGNOSIS — J449 Chronic obstructive pulmonary disease, unspecified: Secondary | ICD-10-CM | POA: Diagnosis not present

## 2021-11-22 DIAGNOSIS — D509 Iron deficiency anemia, unspecified: Secondary | ICD-10-CM | POA: Diagnosis not present

## 2021-11-22 DIAGNOSIS — I951 Orthostatic hypotension: Secondary | ICD-10-CM | POA: Diagnosis not present

## 2021-11-22 DIAGNOSIS — F32A Depression, unspecified: Secondary | ICD-10-CM | POA: Diagnosis not present

## 2021-11-22 DIAGNOSIS — S72142D Displaced intertrochanteric fracture of left femur, subsequent encounter for closed fracture with routine healing: Secondary | ICD-10-CM | POA: Diagnosis not present

## 2021-11-22 DIAGNOSIS — K5909 Other constipation: Secondary | ICD-10-CM | POA: Diagnosis not present

## 2021-11-22 LAB — CULTURE, BLOOD (SINGLE): Culture: NO GROWTH

## 2021-11-25 DIAGNOSIS — M6281 Muscle weakness (generalized): Secondary | ICD-10-CM | POA: Diagnosis not present

## 2021-11-25 DIAGNOSIS — D509 Iron deficiency anemia, unspecified: Secondary | ICD-10-CM | POA: Diagnosis not present

## 2021-11-25 DIAGNOSIS — K5904 Chronic idiopathic constipation: Secondary | ICD-10-CM | POA: Diagnosis not present

## 2021-11-25 DIAGNOSIS — K5909 Other constipation: Secondary | ICD-10-CM | POA: Diagnosis not present

## 2021-11-25 DIAGNOSIS — S72142D Displaced intertrochanteric fracture of left femur, subsequent encounter for closed fracture with routine healing: Secondary | ICD-10-CM | POA: Diagnosis not present

## 2021-11-25 DIAGNOSIS — J449 Chronic obstructive pulmonary disease, unspecified: Secondary | ICD-10-CM | POA: Diagnosis not present

## 2021-11-25 DIAGNOSIS — I951 Orthostatic hypotension: Secondary | ICD-10-CM | POA: Diagnosis not present

## 2021-11-25 DIAGNOSIS — R1319 Other dysphagia: Secondary | ICD-10-CM | POA: Diagnosis not present

## 2021-11-25 DIAGNOSIS — I5032 Chronic diastolic (congestive) heart failure: Secondary | ICD-10-CM | POA: Diagnosis not present

## 2021-11-26 ENCOUNTER — Emergency Department: Payer: Medicare PPO

## 2021-11-26 ENCOUNTER — Other Ambulatory Visit: Payer: Self-pay

## 2021-11-26 DIAGNOSIS — M79605 Pain in left leg: Secondary | ICD-10-CM | POA: Diagnosis not present

## 2021-11-26 DIAGNOSIS — M79662 Pain in left lower leg: Secondary | ICD-10-CM | POA: Diagnosis not present

## 2021-11-26 DIAGNOSIS — M25562 Pain in left knee: Secondary | ICD-10-CM | POA: Diagnosis not present

## 2021-11-26 DIAGNOSIS — Z9889 Other specified postprocedural states: Secondary | ICD-10-CM | POA: Diagnosis not present

## 2021-11-26 MED ORDER — LIDOCAINE 5 % EX PTCH
1.0000 | MEDICATED_PATCH | CUTANEOUS | Status: DC
  Administered 2021-11-27: 1 via TRANSDERMAL
  Filled 2021-11-26: qty 1

## 2021-11-26 MED ORDER — ACETAMINOPHEN 500 MG PO TABS
1000.0000 mg | ORAL_TABLET | Freq: Once | ORAL | Status: AC
  Administered 2021-11-27: 1000 mg via ORAL
  Filled 2021-11-26: qty 2

## 2021-11-26 NOTE — ED Triage Notes (Signed)
Per ems pt with left sided tibial pain, no known injury. Pt is from [facility]. No swelling noted on inspection, pulse intact, sensory intact. Pt states most painful area is close to knee.

## 2021-11-26 NOTE — ED Notes (Signed)
Dr. Tamala Julian examining pt

## 2021-11-27 ENCOUNTER — Emergency Department
Admission: EM | Admit: 2021-11-27 | Discharge: 2021-11-27 | Disposition: A | Discharge status: Home / Self Care | Payer: Medicare PPO | Attending: Emergency Medicine | Admitting: Emergency Medicine

## 2021-11-27 DIAGNOSIS — M79605 Pain in left leg: Secondary | ICD-10-CM

## 2021-11-27 MED ORDER — LIDOCAINE 5 % EX PTCH: 1.0000 | MEDICATED_PATCH | Freq: Two times a day (BID) | CUTANEOUS | 0 refills | Status: AC

## 2021-11-27 NOTE — ED Provider Notes (Signed)
[facility] Provider Note    Event Date/Time   First MD Initiated Contact with Patient 11/27/21 0025     (approximate)   History   Leg Pain   HPI  Jeremy Herrera is a 81 y.o. male who presents to the ED for evaluation of Leg Pain   I reviewed ED visit from 9/28 where patient was evaluated for similar atraumatic left leg pain.  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  The case was discussed with vascular surgery who did not feel the CT findings explain his symptoms and were nonacute.  Patient's pain resolved and he was discharged back to his SNF with a course of Omnicef for possible UTI.  Patient returns to the ED for left shin pain.  Denies any falls or trauma.  Reports he has had intermittent pain for weeks now.   Physical Exam   Triage Vital Signs: ED Triage Vitals  Enc Vitals Group     BP 11/26/21 2022 (!) 155/96     Pulse Rate 11/26/21 2022 91     Resp 11/26/21 2022 18     Temp 11/26/21 2022 98.5 F (36.9 C)     Temp Source 11/26/21 2022 Axillary     SpO2 11/26/21 2022 99 %     Weight 11/26/21 1959 220 lb 7.4 oz (100 kg)     Height 11/26/21 1959 6\' 2"  (1.88 m)     Head Circumference --      Peak Flow --      Pain Score 11/26/21 1959 5     Pain Loc --      Pain Edu? --      Excl. in [place]? --     Most recent vital signs: Vitals:   11/27/21 0130 11/27/21 0217  BP: 113/64   Pulse: 90   Resp: 18   Temp:  98.3 F (36.8 C)  SpO2: 92%     General: Awake, no distress.  CV:  Good peripheral perfusion.  Resp:  Normal effort.  Abd:  No distention.  MSK:  Bilateral feet and legs appear symmetrical without any acute swelling.  Both are pink and warm to the touch with no clinical signs of ischemia.  Tender to touch to the left shin without any apparent overlying skin changes, bruising or signs of trauma. Neuro:  No focal deficits  appreciated. Other:     ED Results / Procedures / Treatments   Labs (all labs ordered are listed, but only abnormal results are displayed) Labs Reviewed - No data to display  EKG   RADIOLOGY Plan from the left knee interpreted by me without evidence of fracture dislocation. Plain film of the left tibia/fibula interpreted by me without evidence of fracture dislocation. Ultrasound of the left leg interpreted by me without evidence of DVT.  Official radiology report(s): 01/27/22 Venous Img Lower Unilateral Left  Result Date: 11/26/2021 CLINICAL DATA:  Left lower leg and calf pain. Anticoagulation therapy EXAM: Left LOWER EXTREMITY VENOUS DOPPLER ULTRASOUND TECHNIQUE: Gray-scale sonography with compression, as well as color and duplex ultrasound, were performed to evaluate the deep venous system(s) from the level of the common femoral vein through the popliteal and proximal calf veins. COMPARISON:  11/17/2021 FINDINGS: VENOUS Normal compressibility of the common femoral, superficial femoral, and popliteal veins, as well as the visualized calf veins. Visualized portions of profunda femoral vein and great saphenous  vein unremarkable. No filling defects to suggest DVT on grayscale or color Doppler imaging. Doppler waveforms show normal direction of venous flow, normal respiratory plasticity and response to augmentation. Limited views of the contralateral common femoral vein are unremarkable. OTHER None. Limitations: none IMPRESSION: No evidence of deep venous thrombosis in the visualized lower extremity veins. Electronically Signed   By: Lucienne Capers M.D.   On: 11/26/2021 23:10   DG Knee Complete 4 Views Left  Result Date: 11/26/2021 CLINICAL DATA:  Pain and lower leg. EXAM: LEFT KNEE - COMPLETE 4+ VIEW COMPARISON:  None Available. FINDINGS: No evidence of an acute fracture, dislocation, or joint effusion. A radiopaque intramedullary rod and distal fixation screw are seen within the distal left  femur. No evidence of arthropathy or other focal bone abnormality. Soft tissues are unremarkable. IMPRESSION: 1. No acute osseous abnormality. 2. Prior open reduction and internal fixation of the left femur. Electronically Signed   By: Virgina Norfolk M.D.   On: 11/26/2021 21:11   DG Tibia/Fibula Left  Result Date: 11/26/2021 CLINICAL DATA:  Lower leg pain. EXAM: LEFT TIBIA AND FIBULA - 2 VIEW COMPARISON:  None Available. FINDINGS: There is no evidence of an acute fracture or other focal bone lesions within the left tibia or left fibula. A radiopaque intramedullary rod and distal fixation screw is seen within the visualized portion of the distal left femur. Soft tissues are unremarkable. IMPRESSION: 1. No acute osseous abnormality. 2. Postoperative changes involving the distal left femur. Electronically Signed   By: Virgina Norfolk M.D.   On: 11/26/2021 21:10    PROCEDURES and INTERVENTIONS:  Procedures  Medications  lidocaine (LIDODERM) 5 % 1 patch (1 patch Transdermal Patch Applied 11/27/21 0040)  acetaminophen (TYLENOL) tablet 1,000 mg (1,000 mg Oral Given 11/27/21 0041)     IMPRESSION / MDM / ASSESSMENT AND PLAN / ED COURSE  I reviewed the triage vital signs and the nursing notes.  Differential diagnosis includes, but is not limited to, arterial ischemia, DVT, fracture, muscle spasm  {Patient presents with symptoms of an acute illness or injury that is potentially life-threatening.  81 year old male presents with intermittent chronic left leg pain, possibly MSK in etiology and ultimately suitable for return to SNF.  Looks systemically well and has reassuring vital signs.  Reassuring examination without signs of trauma, swelling, neurologic or vascular deficits.  No signs of arterial ischemia.  Plain films and ultrasound are reassuring.  I considered a repeat CT angiogram but clinically I do not have a strong suspicion for acute critical limb ischemia contribute to his symptoms.  His pain  resolves with Tylenol and a lidocaine patch.  I think would be reasonable to discharge the patient with these measures back at his SNF and have him follow-up with his PCP.  Clinical Course as of 11/27/21 0234  Sun Nov 27, 2021  0146 Reassessed.  Drastically improved pain.  Reports feeling well now. [DS]  0230 Reassessed.  Still controlled pain and feeling well.  We discussed possible etiologies of his pain. [DS]    Clinical Course User Index [DS] Vladimir Crofts, MD     FINAL CLINICAL IMPRESSION(S) / ED DIAGNOSES   Final diagnoses:  Left leg pain     Rx / DC Orders   ED Discharge Orders          Ordered    lidocaine (LIDODERM) 5 %  Every 12 hours        11/27/21 0232  Note:  This document was prepared using Dragon voice recognition software and may include unintentional dictation errors.   Delton Prairie, MD 11/27/21 [phone]

## 2021-11-27 NOTE — ED Notes (Signed)
[facility] to transport pt to [facility] 

## 2021-11-27 NOTE — Discharge Instructions (Signed)
Use Tylenol for pain and fevers.  Up to 1000 mg per dose, up to 4 times per day.  Do not take more than 4000 mg of Tylenol/acetaminophen within 24 hours.. ° °Please use lidocaine patches at your site of pain.  Apply 1 patch at a time, leave on for 12 hours, then remove for 12 hours.  12 hours on, 12 hours off.  Do not apply more than 1 patch at a time. ° °

## 2021-11-28 DIAGNOSIS — J449 Chronic obstructive pulmonary disease, unspecified: Secondary | ICD-10-CM | POA: Diagnosis not present

## 2021-11-28 DIAGNOSIS — I5032 Chronic diastolic (congestive) heart failure: Secondary | ICD-10-CM | POA: Diagnosis not present

## 2021-11-28 DIAGNOSIS — R1319 Other dysphagia: Secondary | ICD-10-CM | POA: Diagnosis not present

## 2021-11-28 DIAGNOSIS — K5909 Other constipation: Secondary | ICD-10-CM | POA: Diagnosis not present

## 2021-11-28 DIAGNOSIS — K5904 Chronic idiopathic constipation: Secondary | ICD-10-CM | POA: Diagnosis not present

## 2021-11-28 DIAGNOSIS — S72142D Displaced intertrochanteric fracture of left femur, subsequent encounter for closed fracture with routine healing: Secondary | ICD-10-CM | POA: Diagnosis not present

## 2021-11-28 DIAGNOSIS — I951 Orthostatic hypotension: Secondary | ICD-10-CM | POA: Diagnosis not present

## 2021-11-28 DIAGNOSIS — M6281 Muscle weakness (generalized): Secondary | ICD-10-CM | POA: Diagnosis not present

## 2021-11-28 DIAGNOSIS — D509 Iron deficiency anemia, unspecified: Secondary | ICD-10-CM | POA: Diagnosis not present

## 2021-11-30 DIAGNOSIS — M25552 Pain in left hip: Secondary | ICD-10-CM | POA: Diagnosis not present

## 2021-12-02 DIAGNOSIS — M6281 Muscle weakness (generalized): Secondary | ICD-10-CM | POA: Diagnosis not present

## 2021-12-02 DIAGNOSIS — R1319 Other dysphagia: Secondary | ICD-10-CM | POA: Diagnosis not present

## 2021-12-02 DIAGNOSIS — I951 Orthostatic hypotension: Secondary | ICD-10-CM | POA: Diagnosis not present

## 2021-12-02 DIAGNOSIS — K5909 Other constipation: Secondary | ICD-10-CM | POA: Diagnosis not present

## 2021-12-02 DIAGNOSIS — F32A Depression, unspecified: Secondary | ICD-10-CM | POA: Diagnosis not present

## 2021-12-02 DIAGNOSIS — S72142D Displaced intertrochanteric fracture of left femur, subsequent encounter for closed fracture with routine healing: Secondary | ICD-10-CM | POA: Diagnosis not present

## 2021-12-02 DIAGNOSIS — J449 Chronic obstructive pulmonary disease, unspecified: Secondary | ICD-10-CM | POA: Diagnosis not present

## 2021-12-02 DIAGNOSIS — D509 Iron deficiency anemia, unspecified: Secondary | ICD-10-CM | POA: Diagnosis not present

## 2021-12-02 DIAGNOSIS — E782 Mixed hyperlipidemia: Secondary | ICD-10-CM | POA: Diagnosis not present

## 2021-12-05 DIAGNOSIS — I350 Nonrheumatic aortic (valve) stenosis: Secondary | ICD-10-CM | POA: Diagnosis not present

## 2021-12-05 DIAGNOSIS — I950 Idiopathic hypotension: Secondary | ICD-10-CM | POA: Diagnosis not present

## 2021-12-05 DIAGNOSIS — E782 Mixed hyperlipidemia: Secondary | ICD-10-CM | POA: Diagnosis not present

## 2021-12-05 DIAGNOSIS — I482 Chronic atrial fibrillation, unspecified: Secondary | ICD-10-CM | POA: Diagnosis not present

## 2021-12-05 DIAGNOSIS — I2510 Atherosclerotic heart disease of native coronary artery without angina pectoris: Secondary | ICD-10-CM | POA: Diagnosis not present

## 2021-12-12 ENCOUNTER — Encounter: Payer: Self-pay | Admitting: Medical Oncology

## 2021-12-12 ENCOUNTER — Inpatient Hospital Stay: Payer: Medicare PPO

## 2021-12-12 ENCOUNTER — Inpatient Hospital Stay
Admission: EM | Admit: 2021-12-12 | Discharge: 2021-12-16 | DRG: 947 | Disposition: A | Discharge status: Skilled Nursing Facility | Payer: Medicare PPO | Attending: Internal Medicine | Admitting: Internal Medicine

## 2021-12-12 ENCOUNTER — Emergency Department: Payer: Medicare PPO

## 2021-12-12 DIAGNOSIS — J449 Chronic obstructive pulmonary disease, unspecified: Secondary | ICD-10-CM | POA: Diagnosis not present

## 2021-12-12 DIAGNOSIS — R531 Weakness: Principal | ICD-10-CM | POA: Diagnosis present

## 2021-12-12 DIAGNOSIS — S72002A Fracture of unspecified part of neck of left femur, initial encounter for closed fracture: Secondary | ICD-10-CM | POA: Diagnosis not present

## 2021-12-12 DIAGNOSIS — F32A Depression, unspecified: Secondary | ICD-10-CM | POA: Diagnosis not present

## 2021-12-12 DIAGNOSIS — S72142D Displaced intertrochanteric fracture of left femur, subsequent encounter for closed fracture with routine healing: Secondary | ICD-10-CM | POA: Diagnosis not present

## 2021-12-12 DIAGNOSIS — E440 Moderate protein-calorie malnutrition: Secondary | ICD-10-CM | POA: Diagnosis not present

## 2021-12-12 DIAGNOSIS — S72142S Displaced intertrochanteric fracture of left femur, sequela: Secondary | ICD-10-CM

## 2021-12-12 DIAGNOSIS — N138 Other obstructive and reflux uropathy: Secondary | ICD-10-CM | POA: Diagnosis not present

## 2021-12-12 DIAGNOSIS — Z7401 Bed confinement status: Secondary | ICD-10-CM | POA: Diagnosis not present

## 2021-12-12 DIAGNOSIS — I5043 Acute on chronic combined systolic (congestive) and diastolic (congestive) heart failure: Secondary | ICD-10-CM | POA: Diagnosis not present

## 2021-12-12 DIAGNOSIS — J189 Pneumonia, unspecified organism: Secondary | ICD-10-CM | POA: Diagnosis not present

## 2021-12-12 DIAGNOSIS — Z8679 Personal history of other diseases of the circulatory system: Secondary | ICD-10-CM

## 2021-12-12 DIAGNOSIS — T8484XA Pain due to internal orthopedic prosthetic devices, implants and grafts, initial encounter: Secondary | ICD-10-CM | POA: Diagnosis not present

## 2021-12-12 DIAGNOSIS — J690 Pneumonitis due to inhalation of food and vomit: Secondary | ICD-10-CM | POA: Diagnosis not present

## 2021-12-12 DIAGNOSIS — E119 Type 2 diabetes mellitus without complications: Secondary | ICD-10-CM | POA: Diagnosis present

## 2021-12-12 DIAGNOSIS — W19XXXS Unspecified fall, sequela: Secondary | ICD-10-CM | POA: Diagnosis present

## 2021-12-12 DIAGNOSIS — K219 Gastro-esophageal reflux disease without esophagitis: Secondary | ICD-10-CM | POA: Diagnosis present

## 2021-12-12 DIAGNOSIS — I951 Orthostatic hypotension: Secondary | ICD-10-CM | POA: Diagnosis present

## 2021-12-12 DIAGNOSIS — E43 Unspecified severe protein-calorie malnutrition: Secondary | ICD-10-CM | POA: Diagnosis present

## 2021-12-12 DIAGNOSIS — J479 Bronchiectasis, uncomplicated: Secondary | ICD-10-CM | POA: Diagnosis not present

## 2021-12-12 DIAGNOSIS — R911 Solitary pulmonary nodule: Secondary | ICD-10-CM | POA: Diagnosis not present

## 2021-12-12 DIAGNOSIS — E785 Hyperlipidemia, unspecified: Secondary | ICD-10-CM | POA: Diagnosis present

## 2021-12-12 DIAGNOSIS — Z833 Family history of diabetes mellitus: Secondary | ICD-10-CM

## 2021-12-12 DIAGNOSIS — R1312 Dysphagia, oropharyngeal phase: Secondary | ICD-10-CM | POA: Diagnosis present

## 2021-12-12 DIAGNOSIS — Z7951 Long term (current) use of inhaled steroids: Secondary | ICD-10-CM

## 2021-12-12 DIAGNOSIS — Z95818 Presence of other cardiac implants and grafts: Secondary | ICD-10-CM

## 2021-12-12 DIAGNOSIS — Z8249 Family history of ischemic heart disease and other diseases of the circulatory system: Secondary | ICD-10-CM

## 2021-12-12 DIAGNOSIS — N401 Enlarged prostate with lower urinary tract symptoms: Secondary | ICD-10-CM | POA: Diagnosis present

## 2021-12-12 DIAGNOSIS — I4811 Longstanding persistent atrial fibrillation: Secondary | ICD-10-CM | POA: Diagnosis not present

## 2021-12-12 DIAGNOSIS — I10 Essential (primary) hypertension: Secondary | ICD-10-CM | POA: Diagnosis not present

## 2021-12-12 DIAGNOSIS — Z9079 Acquired absence of other genital organ(s): Secondary | ICD-10-CM

## 2021-12-12 DIAGNOSIS — Z79899 Other long term (current) drug therapy: Secondary | ICD-10-CM

## 2021-12-12 DIAGNOSIS — R1084 Generalized abdominal pain: Secondary | ICD-10-CM | POA: Diagnosis not present

## 2021-12-12 DIAGNOSIS — Z6821 Body mass index (BMI) 21.0-21.9, adult: Secondary | ICD-10-CM

## 2021-12-12 DIAGNOSIS — Z885 Allergy status to narcotic agent status: Secondary | ICD-10-CM | POA: Diagnosis not present

## 2021-12-12 DIAGNOSIS — M79605 Pain in left leg: Secondary | ICD-10-CM | POA: Diagnosis present

## 2021-12-12 DIAGNOSIS — G8929 Other chronic pain: Secondary | ICD-10-CM | POA: Diagnosis not present

## 2021-12-12 DIAGNOSIS — R1314 Dysphagia, pharyngoesophageal phase: Secondary | ICD-10-CM | POA: Diagnosis not present

## 2021-12-12 DIAGNOSIS — Z7982 Long term (current) use of aspirin: Secondary | ICD-10-CM

## 2021-12-12 DIAGNOSIS — Z89422 Acquired absence of other left toe(s): Secondary | ICD-10-CM

## 2021-12-12 DIAGNOSIS — M79606 Pain in leg, unspecified: Secondary | ICD-10-CM | POA: Diagnosis not present

## 2021-12-12 DIAGNOSIS — I4891 Unspecified atrial fibrillation: Secondary | ICD-10-CM | POA: Diagnosis present

## 2021-12-12 DIAGNOSIS — G8921 Chronic pain due to trauma: Secondary | ICD-10-CM | POA: Diagnosis present

## 2021-12-12 DIAGNOSIS — W19XXXA Unspecified fall, initial encounter: Secondary | ICD-10-CM | POA: Diagnosis not present

## 2021-12-12 DIAGNOSIS — I2510 Atherosclerotic heart disease of native coronary artery without angina pectoris: Secondary | ICD-10-CM | POA: Diagnosis present

## 2021-12-12 DIAGNOSIS — Z66 Do not resuscitate: Secondary | ICD-10-CM | POA: Diagnosis present

## 2021-12-12 DIAGNOSIS — R110 Nausea: Secondary | ICD-10-CM | POA: Diagnosis present

## 2021-12-12 DIAGNOSIS — Z515 Encounter for palliative care: Secondary | ICD-10-CM | POA: Diagnosis not present

## 2021-12-12 DIAGNOSIS — R102 Pelvic and perineal pain: Secondary | ICD-10-CM | POA: Diagnosis not present

## 2021-12-12 DIAGNOSIS — R1310 Dysphagia, unspecified: Secondary | ICD-10-CM

## 2021-12-12 DIAGNOSIS — Z818 Family history of other mental and behavioral disorders: Secondary | ICD-10-CM

## 2021-12-12 DIAGNOSIS — M25552 Pain in left hip: Secondary | ICD-10-CM | POA: Diagnosis not present

## 2021-12-12 DIAGNOSIS — I482 Chronic atrial fibrillation, unspecified: Secondary | ICD-10-CM | POA: Diagnosis not present

## 2021-12-12 DIAGNOSIS — R6889 Other general symptoms and signs: Secondary | ICD-10-CM | POA: Diagnosis not present

## 2021-12-12 LAB — CBC WITH DIFFERENTIAL/PLATELET
Abs Immature Granulocytes: 0.15 10*3/uL — ABNORMAL HIGH (ref 0.00–0.07)
Basophils Absolute: 0.1 10*3/uL (ref 0.0–0.1)
Basophils Relative: 1 %
Eosinophils Absolute: 0.4 10*3/uL (ref 0.0–0.5)
Eosinophils Relative: 4 %
HCT: 38.5 % — ABNORMAL LOW (ref 39.0–52.0)
Hemoglobin: 12.9 g/dL — ABNORMAL LOW (ref 13.0–17.0)
Immature Granulocytes: 2 %
Lymphocytes Relative: 21 %
Lymphs Abs: 1.9 10*3/uL (ref 0.7–4.0)
MCH: 30.7 pg (ref 26.0–34.0)
MCHC: 33.5 g/dL (ref 30.0–36.0)
MCV: 91.7 fL (ref 80.0–100.0)
Monocytes Absolute: 0.6 10*3/uL (ref 0.1–1.0)
Monocytes Relative: 7 %
Neutro Abs: 5.9 10*3/uL (ref 1.7–7.7)
Neutrophils Relative %: 65 %
Platelets: 189 10*3/uL (ref 150–400)
RBC: 4.2 MIL/uL — ABNORMAL LOW (ref 4.22–5.81)
RDW: 15.9 % — ABNORMAL HIGH (ref 11.5–15.5)
WBC: 9 10*3/uL (ref 4.0–10.5)
nRBC: 0 % (ref 0.0–0.2)

## 2021-12-12 LAB — COMPREHENSIVE METABOLIC PANEL
ALT: 25 U/L (ref 0–44)
AST: 29 U/L (ref 15–41)
Albumin: 3.1 g/dL — ABNORMAL LOW (ref 3.5–5.0)
Alkaline Phosphatase: 155 U/L — ABNORMAL HIGH (ref 38–126)
Anion gap: 7 (ref 5–15)
BUN: 17 mg/dL (ref 8–23)
CO2: 28 mmol/L (ref 22–32)
Calcium: 8.5 mg/dL — ABNORMAL LOW (ref 8.9–10.3)
Chloride: 105 mmol/L (ref 98–111)
Creatinine, Ser: 1.02 mg/dL (ref 0.61–1.24)
GFR, Estimated: >60 mL/min (ref >60)
Glucose, Bld: 127 mg/dL — ABNORMAL HIGH (ref 70–99)
Potassium: 3.6 mmol/L (ref 3.5–5.1)
Sodium: 140 mmol/L (ref 135–145)
Total Bilirubin: 1.1 mg/dL (ref 0.3–1.2)
Total Protein: 6.4 g/dL — ABNORMAL LOW (ref 6.5–8.1)

## 2021-12-12 LAB — TROPONIN I (HIGH SENSITIVITY)
Troponin I (High Sensitivity): 22 ng/L — ABNORMAL HIGH (ref <18)
Troponin I (High Sensitivity): 24 ng/L — ABNORMAL HIGH (ref <18)

## 2021-12-12 MED ORDER — MOMETASONE FURO-FORMOTEROL FUM 200-5 MCG/ACT IN AERO
2.0000 | INHALATION_SPRAY | Freq: Two times a day (BID) | RESPIRATORY_TRACT | Status: DC
  Administered 2021-12-13 – 2021-12-16 (×6): 2 via RESPIRATORY_TRACT
  Filled 2021-12-12: qty 8.8

## 2021-12-12 MED ORDER — SODIUM CHLORIDE 0.9 % IV SOLN
3.0000 g | Freq: Once | INTRAVENOUS | Status: AC
  Administered 2021-12-12: 3 g via INTRAVENOUS
  Filled 2021-12-12: qty 8

## 2021-12-12 MED ORDER — MIDODRINE HCL 5 MG PO TABS
10.0000 mg | ORAL_TABLET | Freq: Three times a day (TID) | ORAL | Status: DC
  Administered 2021-12-13 – 2021-12-16 (×10): 10 mg via ORAL
  Filled 2021-12-12 (×11): qty 2

## 2021-12-12 MED ORDER — ASPIRIN 81 MG PO TBEC
81.0000 mg | DELAYED_RELEASE_TABLET | Freq: Every day | ORAL | Status: DC
  Administered 2021-12-13 – 2021-12-16 (×4): 81 mg via ORAL
  Filled 2021-12-12 (×4): qty 1

## 2021-12-12 MED ORDER — SERTRALINE HCL 50 MG PO TABS
25.0000 mg | ORAL_TABLET | Freq: Every day | ORAL | Status: DC
  Administered 2021-12-13 – 2021-12-16 (×4): 25 mg via ORAL
  Filled 2021-12-12 (×4): qty 1

## 2021-12-12 MED ORDER — ONDANSETRON HCL 4 MG PO TABS: 4.0000 mg | ORAL_TABLET | Freq: Four times a day (QID) | ORAL | Status: DC | PRN

## 2021-12-12 MED ORDER — ROSUVASTATIN CALCIUM 10 MG PO TABS
10.0000 mg | ORAL_TABLET | Freq: Every day | ORAL | Status: DC
  Administered 2021-12-13 – 2021-12-16 (×4): 10 mg via ORAL
  Filled 2021-12-12 (×4): qty 1

## 2021-12-12 MED ORDER — ENOXAPARIN SODIUM 40 MG/0.4ML IJ SOSY
40.0000 mg | PREFILLED_SYRINGE | INTRAMUSCULAR | Status: DC
  Administered 2021-12-12 – 2021-12-15 (×4): 40 mg via SUBCUTANEOUS
  Filled 2021-12-12 (×4): qty 0.4

## 2021-12-12 MED ORDER — ONDANSETRON HCL 4 MG/2ML IJ SOLN: 4.0000 mg | Freq: Four times a day (QID) | INTRAMUSCULAR | Status: DC | PRN

## 2021-12-12 MED ORDER — SODIUM CHLORIDE 0.9 % IV SOLN: 1.0000 g | Freq: Once | INTRAVENOUS | Status: DC

## 2021-12-12 MED ORDER — OXYCODONE HCL 5 MG PO TABS
5.0000 mg | ORAL_TABLET | ORAL | Status: DC | PRN
  Administered 2021-12-13 (×2): 5 mg via ORAL
  Filled 2021-12-12 (×2): qty 1

## 2021-12-12 MED ORDER — OXYCODONE-ACETAMINOPHEN 5-325 MG PO TABS
1.0000 | ORAL_TABLET | Freq: Once | ORAL | Status: AC
  Administered 2021-12-12: 1 via ORAL
  Filled 2021-12-12: qty 1

## 2021-12-12 MED ORDER — SENNA 8.6 MG PO TABS
1.0000 | ORAL_TABLET | Freq: Every day | ORAL | Status: DC
  Administered 2021-12-13 – 2021-12-16 (×4): 8.6 mg via ORAL
  Filled 2021-12-12 (×4): qty 1

## 2021-12-12 MED ORDER — SODIUM CHLORIDE 0.9 % IV SOLN: INTRAVENOUS | Status: AC

## 2021-12-12 MED ORDER — SIMETHICONE 80 MG PO CHEW: 125.0000 mg | CHEWABLE_TABLET | Freq: Four times a day (QID) | ORAL | Status: DC | PRN

## 2021-12-12 MED ORDER — BISACODYL 10 MG RE SUPP: 10.0000 mg | Freq: Every day | RECTAL | Status: DC | PRN

## 2021-12-12 MED ORDER — METOPROLOL SUCCINATE ER 25 MG PO TB24
37.5000 mg | ORAL_TABLET | Freq: Every day | ORAL | Status: DC
  Administered 2021-12-13 – 2021-12-16 (×4): 37.5 mg via ORAL
  Filled 2021-12-12 (×4): qty 2

## 2021-12-12 MED ORDER — METHOCARBAMOL 500 MG PO TABS
500.0000 mg | ORAL_TABLET | Freq: Four times a day (QID) | ORAL | Status: DC | PRN
  Administered 2021-12-13: 500 mg via ORAL
  Filled 2021-12-12: qty 1

## 2021-12-12 MED ORDER — ENSURE ENLIVE PO LIQD
237.0000 mL | Freq: Three times a day (TID) | ORAL | Status: DC
  Administered 2021-12-12 – 2021-12-16 (×11): 237 mL via ORAL

## 2021-12-12 MED ORDER — ONDANSETRON HCL 4 MG/2ML IJ SOLN
4.0000 mg | Freq: Once | INTRAMUSCULAR | Status: AC
  Administered 2021-12-12: 4 mg via INTRAVENOUS
  Filled 2021-12-12: qty 2

## 2021-12-12 MED ORDER — FERROUS SULFATE 325 (65 FE) MG PO TABS
325.0000 mg | ORAL_TABLET | Freq: Every day | ORAL | Status: DC
  Administered 2021-12-13 – 2021-12-16 (×4): 325 mg via ORAL
  Filled 2021-12-12 (×4): qty 1

## 2021-12-12 MED ORDER — ALBUTEROL SULFATE (2.5 MG/3ML) 0.083% IN NEBU: 3.0000 mL | INHALATION_SOLUTION | RESPIRATORY_TRACT | Status: DC | PRN

## 2021-12-12 MED ORDER — SODIUM CHLORIDE 0.9 % IV SOLN
3.0000 g | Freq: Four times a day (QID) | INTRAVENOUS | Status: DC
  Administered 2021-12-12 – 2021-12-13 (×4): 3 g via INTRAVENOUS
  Filled 2021-12-12: qty 3
  Filled 2021-12-12 (×4): qty 8

## 2021-12-12 MED ORDER — DOCUSATE SODIUM 100 MG PO CAPS: 100.0000 mg | ORAL_CAPSULE | Freq: Two times a day (BID) | ORAL | Status: DC | PRN

## 2021-12-12 MED ORDER — VITAMIN B-12 1000 MCG PO TABS
1000.0000 ug | ORAL_TABLET | Freq: Every day | ORAL | Status: DC
  Administered 2021-12-13 – 2021-12-16 (×4): 1000 ug via ORAL
  Filled 2021-12-12 (×4): qty 1

## 2021-12-12 MED ORDER — LACTATED RINGERS IV BOLUS
1000.0000 mL | Freq: Once | INTRAVENOUS | Status: AC
  Administered 2021-12-12: 1000 mL via INTRAVENOUS

## 2021-12-12 MED ORDER — PANTOPRAZOLE SODIUM 40 MG PO TBEC
40.0000 mg | DELAYED_RELEASE_TABLET | Freq: Two times a day (BID) | ORAL | Status: DC
  Administered 2021-12-12 – 2021-12-16 (×8): 40 mg via ORAL
  Filled 2021-12-12 (×8): qty 1

## 2021-12-12 MED ORDER — SODIUM CHLORIDE 0.9 % IV SOLN: 500.0000 mg | Freq: Once | INTRAVENOUS | Status: DC

## 2021-12-12 NOTE — Assessment & Plan Note (Signed)
Continue metoprolol for rate control Patient not on anticoagulation due to increased risk for falls

## 2021-12-12 NOTE — Assessment & Plan Note (Signed)
Not acutely exacerbated  Continue as needed bronchodilator therapy and inhaled steroids 

## 2021-12-12 NOTE — ED Triage Notes (Signed)
Pt from home via ems- reports that pt was discharged from rehab on Thursday- he was there post fall on sept 8 with femur fx. Pt here today with reports that he is unable to stand and has not improved through rehab. Pt was given 54mcg fentanyl PTA.

## 2021-12-12 NOTE — ED Provider Notes (Signed)
[facility] Provider Note    Event Date/Time   First MD Initiated Contact with Patient 12/12/21 1222     (approximate)   History   Chief Complaint Leg Pain and Pain   HPI  Jeremy Herrera is a 81 y.o. male with past medical history of hypertension, diabetes, CHF, CAD, COPD, atrial fibrillation status post Watchman device who presents to the ED complaining of weakness and leg pain.  Patient reports that he has been dealing with pain in his left leg ever since he had intramedullary nail placed on September 5 for left hip fracture.  He denies recent falls but has been working with physical therapy, states he felt very weak earlier today and had to be called to prevent him from falling.  He has apparently been home from rehab for 4 days and has been feeling progressively weak since then.  He endorses a cough with some mild difficulty breathing, denies any fevers.  He has not noticed any swelling in either leg.  He does admit to a history of aspiration, feels like he has been choking on his food at times at home.     Physical Exam   Triage Vital Signs: ED Triage Vitals  Enc Vitals Group     BP 12/12/21 1154 (!) 121/51     Pulse Rate 12/12/21 1154 92     Resp 12/12/21 1154 18     Temp 12/12/21 1205 99 F (37.2 C)     Temp Source 12/12/21 1205 Rectal     SpO2 12/12/21 1154 93 %     Weight 12/12/21 1150 220 lb 7.4 oz (100 kg)     Height 12/12/21 1150 6\' 2"  (1.88 m)     Head Circumference --      Peak Flow --      Pain Score 12/12/21 1150 3     Pain Loc --      Pain Edu? --      Excl. in [place]? --     Most recent vital signs: Vitals:   12/12/21 1205 12/12/21 1454  BP:  117/69  Pulse:  73  Resp:  17  Temp: 99 F (37.2 C) 97.8 F (36.6 C)  SpO2:  98%    Constitutional: Alert and oriented. Eyes: Conjunctivae are normal. Head: Atraumatic. Nose: No congestion/rhinnorhea. Mouth/Throat: Mucous membranes are moist.  Cardiovascular: Normal rate,  regular rhythm. Grossly normal heart sounds.  2+ radial and DP pulses bilaterally. Respiratory: Normal respiratory effort.  No retractions. Lungs CTAB. Gastrointestinal: Soft and nontender. No distention. Musculoskeletal: Left lower leg diffusely tender to palpation with no erythema or edema noted. Neurologic:  Normal speech and language. No gross focal neurologic deficits are appreciated.    ED Results / Procedures / Treatments   Labs (all labs ordered are listed, but only abnormal results are displayed) Labs Reviewed  CBC WITH DIFFERENTIAL/PLATELET - Abnormal; Notable for the following components:      Result Value   RBC 4.20 (*)    Hemoglobin 12.9 (*)    HCT 38.5 (*)    RDW 15.9 (*)    Abs Immature Granulocytes 0.15 (*)    All other components within normal limits  COMPREHENSIVE METABOLIC PANEL - Abnormal; Notable for the following components:   Glucose, Bld 127 (*)    Calcium 8.5 (*)    Total Protein 6.4 (*)    Albumin 3.1 (*)    Alkaline Phosphatase 155 (*)    All other components within normal  limits  TROPONIN I (HIGH SENSITIVITY) - Abnormal; Notable for the following components:   Troponin I (High Sensitivity) 22 (*)    All other components within normal limits  TROPONIN I (HIGH SENSITIVITY) - Abnormal; Notable for the following components:   Troponin I (High Sensitivity) 24 (*)    All other components within normal limits  URINALYSIS, ROUTINE W REFLEX MICROSCOPIC    RADIOLOGY Chest x-ray reviewed and interpreted by me with bilateral infiltrates in the posterior lower lobes concerning for pneumonia.  PROCEDURES:  Critical Care performed: No  Procedures   MEDICATIONS ORDERED IN ED: Medications  lactated ringers bolus 1,000 mL (0 mLs Intravenous Stopped 12/12/21 1450)  oxyCODONE-acetaminophen (PERCOCET/ROXICET) 5-325 MG per tablet 1 tablet (1 tablet Oral Given 12/12/21 1250)  ondansetron (ZOFRAN) injection 4 mg (4 mg Intravenous Given 12/12/21 1251)   Ampicillin-Sulbactam (UNASYN) 3 g in sodium chloride 0.9 % 100 mL IVPB (3 g Intravenous New Bag/Given 12/12/21 1450)     IMPRESSION / MDM / ASSESSMENT AND PLAN / ED COURSE  I reviewed the triage vital signs and the nursing notes.                              81 y.o. male with past medical history of hypertension, diabetes, CAD, CHF, atrial fibrillation status post Watchman device, COPD, and recent hip fracture who presents to the ED complaining of worsening generalized weakness for the past 4 days with ongoing left leg pain and cough with mild difficulty breathing.  Patient's presentation is most consistent with acute presentation with potential threat to life or bodily function.  Differential diagnosis includes, but is not limited to, ACS, PE, pneumonia, COPD, CHF, cellulitis, DVT, arterial insufficiency, venous insufficiency.  Patient chronically ill-appearing but in no acute distress, vital signs are unremarkable.  He remains neurovascular intact to his left lower extremity with palpable DP pulse and no signs of infectious process.  At this point, his left lower extremity pain appears to be chronic, not well controlled with tramadol prescribed by his PCP.  No new traumatic injury to necessitate additional imaging today and no concerns for acute arterial insufficiency.  He does report significant generalized weakness with difficulty ambulating to his baseline, was sent to the ED by his physical therapist due to this concern.  Labs without significant anemia or leukocytosis, no significant electrolyte abnormality or AKI noted.  He does have mildly elevated troponin but this is stable on recheck and I doubt ACS or PE.  Chest x-ray does show bilateral basilar infiltrates concerning for pneumonia and he has previously been noted to be high risk for aspiration.  We will treat with IV Unasyn and case discussed with hospitalist for admission.      FINAL CLINICAL IMPRESSION(S) / ED DIAGNOSES    Final diagnoses:  Generalized weakness  Chronic pain of left lower extremity  Aspiration pneumonia of both lower lobes, unspecified aspiration pneumonia type (HCC)     Rx / DC Orders   ED Discharge Orders     None        Note:  This document was prepared using Dragon voice recognition software and may include unintentional dictation errors.   Chesley Noon, MD 12/12/21 [id number]

## 2021-12-12 NOTE — ED Notes (Addendum)
Pt heavily soild in triage of urine and stool. Pt cleaned up. Pt stool is black, pt sts that he takes iron pills. Pt sts that she lives at home with family but hasnt been cleaned up in one to two days. Pt sts that he was just released from [facility] for rehab.

## 2021-12-12 NOTE — Assessment & Plan Note (Signed)
Continue aspirin, low-dose beta-blocker and statins

## 2021-12-12 NOTE — H&P (Signed)
History and Physical    Patient: Jeremy Herrera DOB: March 25, 1940 DOA: 12/12/2021 DOS: the patient was seen and examined on 12/12/2021 PCP: Marina Goodell, MD  Patient coming from: Home  Chief Complaint:  Chief Complaint  Patient presents with   Leg Pain   Pain   HPI: Jeremy Herrera is a 81 y.o. male with medical history significant for hypertension, diabetes mellitus, coronary artery disease, A-fib status post Watchman device insertion, COPD who presents to the ER for evaluation of severe left leg pain and weakness. Patient has had several hospitalizations and ER visits since 10/25/21 when he presented to the ER after a fall and had a comminuted, displaced and impacted intratrochanteric fracture of the left hip.  He is status post intramedullary nailing of the left femur.  His hospital course was complicated by A-fib with RVR and associated hypotension requiring IV pressors as well as acute blood loss anemia requiring blood transfusion.  He was discharged to a subacute rehab on 11/04/21 and returned back to the emergency room on 11/10/21 for evaluation of a period of unresponsiveness.   He was discharged on 11/16/21 and returned to the ER within 24 hours after discharge for evaluation of left leg pain.  Admission was requested but patient symptoms improved and he was discharged back to the skilled nursing facility for subacute rehab.   He was seen again in the ER on 11/27/2021 and discharged back to the skilled nursing facility. Patient was discharged home from the skilled nursing facility 4 days prior to this admission and his son who is at the bedside states that he did not do well in rehab due to persistent left leg pain.  He was placed on tramadol for pain control which initially helped his pain but no longer provides any analgesic effect.  He states that patient's oral intake has been very poor and he has nausea all the time.  Since his discharge home he has been very  weak and unable to ambulate and physical therapy came out to see him today but patient was unable to participate in therapy due to severe left leg pain prompting visit to the emergency room today.  He has not had any falls. His son who is at the bedside notes that his oral intake has been very poor and patient admits to occasionally choking on his food. He denies having any fever, no chills, no cough, no chest pain, no changes in his bowel habits, no urinary symptoms, no blurred vision or focal deficit. Patient had a chest x-ray which showed Ill-defined airspace opacities in the posterior inferior lower lobes. This may represent atelectasis or infection. He will be admitted to the hospital for further evaluation.    Review of Systems: As mentioned in the history of present illness. All other systems reviewed and are negative. Past Medical History:  Diagnosis Date   Anemia    BPH (benign prostatic hyperplasia)    CHF (congestive heart failure) (HCC)    Diabetes mellitus without complication (HCC)    Dysphagia    GERD (gastroesophageal reflux disease)    HLD (hyperlipidemia)    HTN (hypertension)    Irregular heart beat    Nocturia    Urinary retention    Past Surgical History:  Procedure Laterality Date   AMPUTATION TOE Left 05/04/2017   Procedure: AMPUTATION TOE - third left toe;  Surgeon: Gwyneth Revels, DPM;  Location: [facility] ORS;  Service: Podiatry;  Laterality: Left;   bladder patching  1964  HIP SURGERY Right 2014   INTRAMEDULLARY (IM) NAIL INTERTROCHANTERIC Left 10/25/2021   Procedure: INTRAMEDULLARY (IM) NAIL INTERTROCHANTERIC;  Surgeon: Leim Fabry, MD;  Location: [facility] ORS;  Service: Orthopedics;  Laterality: Left;   IRRIGATION AND DEBRIDEMENT FOOT Left 11/14/2016   Procedure: IRRIGATION AND DEBRIDEMENT FOOT;  Surgeon: Samara Deist, DPM;  Location: [facility] ORS;  Service: Podiatry;  Laterality: Left;   TRANSURETHRAL RESECTION OF PROSTATE     Social History:  reports that he has  never smoked. He has never used smokeless tobacco. He reports that he does not drink alcohol and does not use drugs.  Allergies  Allergen Reactions   Hydrocodone-Acetaminophen Nausea And Vomiting    Family History  Problem Relation Age of Onset   Ulcers Mother        stomach   GER disease Sister        GERD   Heart attack Father    Diabetes Brother    Depression Brother    Kidney disease Neg Hx    Prostate cancer Neg Hx    Kidney cancer Neg Hx    Bladder Cancer Neg Hx     Prior to Admission medications   Medication Sig Start Date End Date Taking? Authorizing Provider  aspirin 81 MG tablet Take 81 mg by mouth daily.   Yes [provider]  lidocaine (LIDODERM) 5 % Place 1 patch onto the skin every 12 (twelve) hours. Remove & Discard patch within 12 hours or as directed by MD 11/27/21 11/27/22 Yes Vladimir Crofts, MD  midodrine (PROAMATINE) 10 MG tablet Take 1 tablet (10 mg total) by mouth 3 (three) times daily with meals. 11/04/21  Yes Amin, Jeanella Flattery, MD  pantoprazole (PROTONIX) 40 MG tablet Take 1 tablet (40 mg total) by mouth 2 (two) times daily before a meal. 11/04/21  Yes Amin, Jeanella Flattery, MD  senna (SENOKOT) 8.6 MG TABS tablet Take 1 tablet (8.6 mg total) by mouth daily. 11/04/21  Yes Amin, Jeanella Flattery, MD  vitamin B-12 (CYANOCOBALAMIN) 1000 MCG tablet Take 1 tablet (1,000 mcg total) by mouth daily. 08/19/20  Yes Earlie Server, MD  albuterol (VENTOLIN HFA) 108 (90 Base) MCG/ACT inhaler Inhale 1-2 puffs into the lungs every 4 (four) hours as needed.  07/07/18   [provider]  bisacodyl (DULCOLAX) 10 MG suppository Place 1 suppository (10 mg total) rectally daily as needed for moderate constipation. 11/04/21   Amin, Jeanella Flattery, MD  digoxin (LANOXIN) 0.25 MG tablet Take 1 tablet (0.25 mg total) by mouth daily. Patient not taking: Reported on 12/12/2021 11/16/21   Enzo Bi, MD  docusate sodium (COLACE) 100 MG capsule Take 1 capsule (100 mg total) by mouth 2 (two) times  daily as needed for mild constipation. 11/04/21   Amin, Jeanella Flattery, MD  enoxaparin (LOVENOX) 40 MG/0.4ML injection Inject 0.4 mLs (40 mg total) into the skin at bedtime for 14 days. 11/16/21 11/30/21  Enzo Bi, MD  feeding supplement (ENSURE ENLIVE / ENSURE PLUS) LIQD Take 237 mLs by mouth 2 (two) times daily between meals. 11/16/21   Enzo Bi, MD  ferrous sulfate 325 (65 FE) MG tablet Take 325 mg by mouth daily.    [provider]  Fluticasone-Salmeterol (ADVAIR) 250-50 MCG/DOSE AEPB Inhale 1 puff into the lungs in the morning and at bedtime.    [provider]  methocarbamol (ROBAXIN) 500 MG tablet Take 1 tablet (500 mg total) by mouth every 6 (six) hours as needed for muscle spasms. Patient not taking: Reported on 12/12/2021  10/28/21   Dedra Skeens, PA-C  metoprolol succinate (TOPROL-XL) 25 MG 24 hr tablet Take 0.5 tablets (12.5 mg total) by mouth daily. Patient taking differently: Take 37.5 mg by mouth daily. 11/16/21   Darlin Priestly, MD  ondansetron (ZOFRAN) 4 MG tablet Take 1 tablet (4 mg total) by mouth every 6 (six) hours as needed for nausea. Patient not taking: Reported on 12/12/2021 10/28/21   Dedra Skeens, PA-C  rosuvastatin (CRESTOR) 10 MG tablet Take 1 tablet by mouth daily. 07/27/20 11/17/21  [provider]  sertraline (ZOLOFT) 25 MG tablet Take 25 mg by mouth daily.  08/31/18   [provider]  simethicone (MYLICON) 125 MG chewable tablet Chew 125 mg by mouth every 6 (six) hours as needed.     [provider]  Skin Protectants, Misc. (REMEDY PHYTOPLEX HYDRAGUARD) CREA Apply topically.    [provider]  umeclidinium-vilanterol (ANORO ELLIPTA) 62.5-25 MCG/INH AEPB Inhale 1 puff into the lungs daily as needed. Takes daily 08/05/18   [provider]    Physical Exam: Vitals:   12/12/21 1201 12/12/21 1205 12/12/21 1454 12/12/21 1500  BP:   117/69 (!) 111/91  Pulse:   73 78  Resp:   17   Temp:  99 F (37.2 C) 97.8 F (36.6 C)    TempSrc:  Rectal Oral   SpO2:   98% 97%  Weight: 77.1 kg     Height:       Physical Exam Vitals and nursing note reviewed.  Constitutional:      Comments: Chronically ill appearing  HENT:     Head: Normocephalic and atraumatic.     Nose: Nose normal.     Mouth/Throat:     Mouth: Mucous membranes are dry.  Eyes:     Conjunctiva/sclera: Conjunctivae normal.  Cardiovascular:     Rate and Rhythm: Normal rate and regular rhythm.  Pulmonary:     Effort: Pulmonary effort is normal.     Breath sounds: Normal breath sounds.  Abdominal:     General: Abdomen is flat. Bowel sounds are normal.     Palpations: Abdomen is soft.  Musculoskeletal:        General: Normal range of motion.     Cervical back: Normal range of motion and neck supple.     Comments: Left hip  Skin:    General: Skin is warm and dry.  Neurological:     Mental Status: He is alert and oriented to person, place, and time.     Motor: Weakness present.  Psychiatric:        Mood and Affect: Mood normal.        Behavior: Behavior normal.    Data Reviewed: Relevant notes from primary care and specialist visits, past discharge summaries as available in EHR, including Care Everywhere. Prior diagnostic testing as pertinent to current admission diagnoses Updated medications and problem lists for reconciliation ED course, including vitals, labs, imaging, treatment and response to treatment Triage notes, nursing and pharmacy notes and ED provider's notes Notable results as noted in HPI Labs reviewed.  Troponin 22 >> 24, sodium 140, potassium 3.6, chloride 105, bicarb 28, glucose 127, BUN 17, creatinine 1.02, calcium 8.5, total protein 6.4, albumin 3.1, AST 29, ALT 25, alkaline phosphatase 155, total bilirubin 1.1, white count 9.0, hemoglobin 12.9, hematocrit 38.5, platelet count 189 Chest x-ray reviewed by me shows Ill-defined airspace opacities in the posterior inferior lower lobes. This may represent atelectasis or  infection. CT scan of the chest without  contrast showed new airspace disease in the left lung base with adjacent centrilobular nodularity consistent with pneumonia, potentially aspiration. Improving airspace disease in the right middle lobe, right lower lobe, and left upper lobe. Stable 6 mm left lower pulmonary nodule. New 5 mm pulmonary nodule in the superior segment of the left lower lobe, potentially related to the infectious/inflammatory process. Recommend follow-up chest CT in 6 months. There are no new results to review at this time.  Assessment and Plan: * Generalized weakness Patient is severely deconditioned following his left hip fracture repair. Physical therapy has been limited by pain in his left leg of an unclear etiology Place patient on fall precautions Consult orthopedic surgery PT eval once patient is stable  Aspiration pneumonia [facility]) Patient with history of dysphagia and choking episodes with meals who has been on a soft diet. Imaging shows a new left lower lobe infiltrate consistent with aspiration Start patient on Unasyn Strict aspiration precautions Consult speech therapy  Orthostatic hypotension Continue midodrine   GERD without esophagitis Place patient on PPI  Malnutrition of moderate degree Moderate Malnutrition related to chronic illness (COPD, CHF, advanced age) as evidenced by moderate fat depletion, moderate muscle depletion. Continue Ensure Enlive p.o. 3 times daily as well as Magic cup 3 times daily with meals  Atrial fibrillation (HCC) Continue metoprolol for rate control Patient not on anticoagulation due to increased risk for falls  COPD (chronic obstructive pulmonary disease) (HCC) Not acutely exacerbated Continue as needed bronchodilator therapy and inhaled steroids  Depression Continue sertraline  CAD (coronary artery disease) Continue aspirin, low-dose beta-blocker and statins      Advance Care Planning:   Code Status: DNR    Consults: Orthopedic surgery, speech therapy, PT  Family Communication: Greater than 50% of time was spent discussing patient's condition and plan of care with his children at the bedside.  Discussed CODE STATUS with his daughter who is his healthcare power of attorney and he is a DNR.  All questions and concerns have been addressed.  They verbalized understanding and agree with the plan  Severity of Illness: The appropriate patient status for this patient is INPATIENT. Inpatient status is judged to be reasonable and necessary in order to provide the required intensity of service to ensure the patient's safety. The patient's presenting symptoms, physical exam findings, and initial radiographic and laboratory data in the context of their chronic comorbidities is felt to place them at high risk for further clinical deterioration. Furthermore, it is not anticipated that the patient will be medically stable for discharge from the hospital within 2 midnights of admission.   * I certify that at the point of admission it is my clinical judgment that the patient will require inpatient hospital care spanning beyond 2 midnights from the point of admission due to high intensity of service, high risk for further deterioration and high frequency of surveillance required.*  Author: Lucile Shutters, MD 12/12/2021 5:17 PM  For on call review [email or web address].

## 2021-12-12 NOTE — Assessment & Plan Note (Signed)
Patient is severely deconditioned following his left hip fracture repair. Physical therapy has been limited by pain in his left leg of an unclear etiology Place patient on fall precautions Consult orthopedic surgery PT eval once patient is stable

## 2021-12-12 NOTE — Assessment & Plan Note (Signed)
Place patient on PPI 

## 2021-12-12 NOTE — Assessment & Plan Note (Signed)
Continue sertraline 

## 2021-12-12 NOTE — Assessment & Plan Note (Signed)
Continue midodrine  

## 2021-12-12 NOTE — Assessment & Plan Note (Addendum)
Moderate Malnutrition related to chronic illness (COPD, CHF, advanced age) as evidenced by moderate fat depletion, moderate muscle depletion. Continue Ensure Enlive p.o. 3 times daily as well as Magic cup 3 times daily with meals Nutrition to see

## 2021-12-12 NOTE — Assessment & Plan Note (Signed)
Patient with history of dysphagia and choking episodes with meals who has been on a soft diet. Imaging shows a new left lower lobe infiltrate consistent with aspiration Start patient on Unasyn Strict aspiration precautions Consult speech therapy

## 2021-12-13 ENCOUNTER — Inpatient Hospital Stay: Payer: Medicare PPO

## 2021-12-13 DIAGNOSIS — M79605 Pain in left leg: Secondary | ICD-10-CM

## 2021-12-13 DIAGNOSIS — I4811 Longstanding persistent atrial fibrillation: Secondary | ICD-10-CM | POA: Diagnosis not present

## 2021-12-13 DIAGNOSIS — E440 Moderate protein-calorie malnutrition: Secondary | ICD-10-CM | POA: Diagnosis not present

## 2021-12-13 DIAGNOSIS — R1314 Dysphagia, pharyngoesophageal phase: Secondary | ICD-10-CM

## 2021-12-13 DIAGNOSIS — Z515 Encounter for palliative care: Secondary | ICD-10-CM

## 2021-12-13 DIAGNOSIS — I482 Chronic atrial fibrillation, unspecified: Secondary | ICD-10-CM

## 2021-12-13 DIAGNOSIS — R531 Weakness: Secondary | ICD-10-CM | POA: Diagnosis not present

## 2021-12-13 DIAGNOSIS — T8484XA Pain due to internal orthopedic prosthetic devices, implants and grafts, initial encounter: Secondary | ICD-10-CM | POA: Insufficient documentation

## 2021-12-13 DIAGNOSIS — Z66 Do not resuscitate: Secondary | ICD-10-CM

## 2021-12-13 DIAGNOSIS — G8929 Other chronic pain: Secondary | ICD-10-CM

## 2021-12-13 LAB — BASIC METABOLIC PANEL
Anion gap: 8 (ref 5–15)
BUN: 14 mg/dL (ref 8–23)
CO2: 27 mmol/L (ref 22–32)
Calcium: 8.5 mg/dL — ABNORMAL LOW (ref 8.9–10.3)
Chloride: 107 mmol/L (ref 98–111)
Creatinine, Ser: 0.89 mg/dL (ref 0.61–1.24)
GFR, Estimated: >60 mL/min (ref >60)
Glucose, Bld: 114 mg/dL — ABNORMAL HIGH (ref 70–99)
Potassium: 3.8 mmol/L (ref 3.5–5.1)
Sodium: 142 mmol/L (ref 135–145)

## 2021-12-13 LAB — CBC
HCT: 37.5 % — ABNORMAL LOW (ref 39.0–52.0)
Hemoglobin: 12.6 g/dL — ABNORMAL LOW (ref 13.0–17.0)
MCH: 30.5 pg (ref 26.0–34.0)
MCHC: 33.6 g/dL (ref 30.0–36.0)
MCV: 90.8 fL (ref 80.0–100.0)
Platelets: 169 10*3/uL (ref 150–400)
RBC: 4.13 MIL/uL — ABNORMAL LOW (ref 4.22–5.81)
RDW: 15.5 % (ref 11.5–15.5)
WBC: 7.9 10*3/uL (ref 4.0–10.5)
nRBC: 0 % (ref 0.0–0.2)

## 2021-12-13 LAB — PROCALCITONIN: Procalcitonin: <0.1 ng/mL

## 2021-12-13 MED ORDER — TRAMADOL HCL 50 MG PO TABS
50.0000 mg | ORAL_TABLET | Freq: Four times a day (QID) | ORAL | Status: DC | PRN
  Administered 2021-12-14 – 2021-12-15 (×2): 50 mg via ORAL
  Filled 2021-12-13 (×2): qty 1

## 2021-12-13 MED ORDER — TRAMADOL HCL 50 MG PO TABS
50.0000 mg | ORAL_TABLET | Freq: Three times a day (TID) | ORAL | Status: DC
  Administered 2021-12-14: 50 mg via ORAL
  Filled 2021-12-13: qty 1

## 2021-12-13 NOTE — Consult Note (Signed)
Consultation Note Date: 12/13/2021   Patient Name: Jeremy Herrera  DOB: 1940/08/11  MRN: [id number]  Age / Sex: 81 y.o., male  PCP: Sofie Hartigan, MD Referring Physician: Annita Brod, MD  HPI/Patient Profile: 81 y.o. male  with past medical history of HTN, type II day Beatties, CHF, CAD, COPD, A-fib (status post Watchman), and fall with subsequent left femur IM nail placement admitted on 12/12/2021 with weakness and leg pain.  This is patient's third inpatient admission in the last 6 months.   PMT was consulted to discuss goals of care.  Clinical Assessment and Goals of Care: I have reviewed medical records including EPIC notes, labs and imaging, assessed the patient and then met with patient at bedside along with SLP therapist to discuss diagnosis prognosis, GOC, EOL wishes, disposition and options.  I introduced Palliative Medicine as specialized medical care for people living with serious illness. It focuses on providing relief from the symptoms and stress of a serious illness. The goal is to improve quality of life for both the patient and the family.  Patient is familiar with palliative medicine as he met with my colleague Walden Field in previous admission.  We discussed patient's current illness and what it means in the larger context of patient's on-going co-morbidities.  I attempted to gauge patient's understanding of his current condition. Patient endorses that his health "got turned upside down" after his fall approximately 2 months ago.  He shares he has been attempting to manage his pain at home with tramadol without relief.  He says it has been difficult for him to work with PT because he is just been in so much pain.  Patient also endorses poor p.o. intake.  He shares he has been experiencing pain and has not felt up for eating much.  He denies difficulty chewing and swallowing but  endorses that sometimes food feels as though it is stuck.  SLP therapist provided teaching and education on patient's esophagus/sensation of chocking.   I attempted to elicit values and goals of care important to the patient. Patient says he would like to have his pain better controlled to that he can be more mobile if possible. Pain assessment completed.   Recommendations are: -give Robaxin PRN more consistently -give OxyIR PRN Q4H more consistently  Both of the above address separate pain pathways and can give a synergystic effect in addressing patient's experience of pain -Mobilize once pain is minimized   Above recommendations discussed with day shift RN Olu.   Advance directives, concepts specific to code status, artificial feeding and hydration, and rehospitalization were considered and discussed. We discussed patient's previously made wishes in his MOST and DNR forms.   Patient confirmed DNR.  Patient also confirmed the following advanced care planning wishes as outlined in his MOST form on file in [facility]:  Cardiopulmonary Resuscitation: Do Not Attempt Resuscitation (DNR/No CPR)  Medical Interventions: Limited Additional Interventions: Use medical treatment, IV fluids and cardiac monitoring as indicated, DO NOT USE intubation or mechanical ventilation. May  consider use of less invasive airway support such as BiPAP or CPAP. Also provide comfort measures. Transfer to the hospital if indicated. Avoid intensive care.   Antibiotics: Antibiotics if indicated  IV Fluids: IV fluids if indicated  Feeding Tube: No feeding tube     Discussed with patient the importance of continued conversation with family and the medical providers regarding overall plan of care and treatment options, ensuring decisions are within the context of the patient's values and GOCs.    Questions and concerns were addressed. The patient was encouraged to call with questions or concerns.  Primary Decision  Maker PATIENT  Physical Exam Vitals reviewed.  Constitutional:      Appearance: Normal appearance.  HENT:     Head: Normocephalic and atraumatic.     Mouth/Throat:     Mouth: Mucous membranes are moist.  Eyes:     Pupils: Pupils are equal, round, and reactive to light.  Cardiovascular:     Rate and Rhythm: Normal rate. Rhythm irregular.  Pulmonary:     Effort: Pulmonary effort is normal.  Abdominal:     Palpations: Abdomen is soft.  Musculoskeletal:     Comments: Generalized weakness, MAETC  Skin:    General: Skin is warm and dry.  Neurological:     Mental Status: He is alert and oriented to person, place, and time.  Psychiatric:        Mood and Affect: Mood normal.        Behavior: Behavior normal.        Thought Content: Thought content normal.        Judgment: Judgment normal.     Palliative Assessment/Data: 50%     Thank you for this consult. Palliative medicine will continue to follow and assist holistically.   Time Total: 75 minutes Greater than 50%  of this time was spent counseling and coordinating care related to the above assessment and plan.  Signed by: Jordan Hawks, DNP, FNP-BC Palliative Medicine    Please contact Palliative Medicine Team phone at [phone] for questions and concerns.  For individual provider: See Shea Evans

## 2021-12-13 NOTE — Evaluation (Signed)
Clinical/Bedside Swallow Evaluation Patient Details  Name: Jeremy Herrera MRN: [id number] Date of Birth: 11/30/1940  Today's Date: 12/13/2021 Time: SLP Start Time (ACUTE ONLY): 1000 SLP Stop Time (ACUTE ONLY): 1100 SLP Time Calculation (min) (ACUTE ONLY): 60 min  Past Medical History:  Past Medical History:  Diagnosis Date   Anemia    BPH (benign prostatic hyperplasia)    CHF (congestive heart failure) (HCC)    Diabetes mellitus without complication (HCC)    Dysphagia    GERD (gastroesophageal reflux disease)    HLD (hyperlipidemia)    HTN (hypertension)    Irregular heart beat    Nocturia    Urinary retention    Past Surgical History:  Past Surgical History:  Procedure Laterality Date   AMPUTATION TOE Left 05/04/2017   Procedure: AMPUTATION TOE - third left toe;  Surgeon: Samara Deist, DPM;  Location: [facility] ORS;  Service: Podiatry;  Laterality: Left;   bladder patching  1964   HIP SURGERY Right 2014   INTRAMEDULLARY (IM) NAIL INTERTROCHANTERIC Left 10/25/2021   Procedure: INTRAMEDULLARY (IM) NAIL INTERTROCHANTERIC;  Surgeon: Leim Fabry, MD;  Location: [facility] ORS;  Service: Orthopedics;  Laterality: Left;   IRRIGATION AND DEBRIDEMENT FOOT Left 11/14/2016   Procedure: IRRIGATION AND DEBRIDEMENT FOOT;  Surgeon: Samara Deist, DPM;  Location: [facility] ORS;  Service: Podiatry;  Laterality: Left;   TRANSURETHRAL RESECTION OF PROSTATE     HPI:  Pt is a 81 y.o. male with medical history significant for hypertension, diabetes mellitus, coronary artery disease, A-fib status post Watchman device insertion, COPD who presents to the ER for evaluation of severe left leg pain and weakness.  Patient has had several hospitalizations and ER visits since 10/25/21 when he presented to the ER after a fall and had a comminuted, displaced and impacted intratrochanteric fracture of the left hip.  He is status post intramedullary nailing of the left femur.  His hospital course was complicated by A-fib with  RVR and associated hypotension requiring IV pressors as well as acute blood loss anemia requiring blood transfusion.   He was discharged to a subacute rehab on 11/04/21 and returned back to the emergency room on 11/10/21 for evaluation of a period of unresponsiveness.    He was discharged on 11/16/21 and returned to the ER within 24 hours after discharge for evaluation of left leg pain.  Admission was requested but patient symptoms improved and he was discharged back to the skilled nursing facility for subacute rehab.    He was seen again in the ER on 11/27/2021 and discharged back to the skilled nursing facility.  Patient was discharged home from the skilled nursing facility 4 days prior to this admission and his son who is at the bedside states that he did not do well in rehab due to persistent left leg pain.  He was placed on tramadol for pain control which initially helped his pain but no longer provides any analgesic effect.  He states that patient's oral intake has been very poor and he has nausea all the time.  Since his discharge home he has been very weak and unable to ambulate and physical therapy came out to see him today but patient was unable to participate in therapy due to severe left leg pain prompting visit to the emergency room today.  He has not had any falls.  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    Assessment / Plan / Recommendation  Clinical Impression   Pt seen for BSE; education of his Baseline GERD w/ Dysphagia and aspiration/Reflux precautions. Pt alert; Mild Confusion -- has Baseline Cognitive decline per chart. Pt also has Baseline of REFLUX/GERD. Pt was talkative describing the event leading him to the ED. Missing Dentition.  On RA; afebrile, WBC WNL.    Pt does have baseline dysphagia evidenced by results  of the MBSS 03/16/15: "moderate oropharyngeal dysphagia consistent with neuromuscular impairment as well as the effects of Laryngopharyngeal Reflux (LPR)(inflammation, edema, and resultant decreased sensation of the larynx and pharynx). Pt also had esophageal dilation ~10 years ago which he reported alleviated several of his dysphagia symptoms but suspects this could be an ongoing issue for him.". No further f/u w/ GI nor endoscopy noted in chart.    Pt and Family have reported that pt throat clears consistently throughout the day IN ABSENCE OF oral intake. This appears as "tic-like" behavior.  He describes and states the swallow strategies he utilizes including small bites and sips, thorough mastication, subsequent swallows, and alternating foods/liquids(liquid washes) to "help me not choke".    The patient denied any acute changes in swallow function; Pt was supported min more upright w/ pillows low behind back to open chest more. He consumed po trials of puree, softened solids (d/t poor Dentition status), and thin liquids via Cup (he stated he drinks from a straw at home d/t difficulty holding a cup w/ his weak hands) w/ No immediate coughing noted; no decline in vocal quality or respiratory status during po trials. (Noted the mild throat clearing -- almost like a tic-like habit intermittently b/t trials-- this did not increase d/t the oral intake). Pt stated he is aware of the need to follow strategies and aspiration precautions to reduce his risk for aspiration. Pt has had no recent report of PNA -- last CXR was in 2021. He reports using oral care, but has been less ambulatory since Fall and hip fx. "I am just really weak".     With small boluses of thin liquids and soft, moist foods, pt feels he "manages ok". He stated he "knows" that he "has more trouble" eating tougher foods, "so I have to be more careful".  Neuromuscular impairment as well as the effects of laryngopharyngeal reflux (inflammation,  edema, and resultant decreased sensation of the larynx and pharynx) were suggested on MBSS. Pt has reported hx of Reflux for which he takes Nexium.  Increased sedentary status w/ Chronic dysphagia could increase risk for aspiration pneumonia. Encouraged use of Incentive Spirometer for respiratory ex d/t sedentary status as was given last week+ at admit. Pt appears at/close to his baseline from last admit ~1-2 weeks ago.   Recommend continue dysphagia 3 diet with thin liquids and adherence to aspiration/REFLUX precautions and compensatory swallowing strategies in setting of Chronic dysphagia/GERD and risk for aspiration/aspiration pneumonia. Pt and Son are aware of this. Pt is able to state back swallowing strategies and precautions he follows. Recommend rest breaks during meals; No Talking during oral intake. Setup support at meals. Pills in Puree for swallowing and clearing of the Esophagus. Encouraged mobility and use of a Dysphagia drink cup in order to lessen risk of aspiration, as was given last admit.  Recommend f/u w/ GI/ENT if any further c/o Esophageal phase issues/food dysmotility as he has the h/o Esophageal dilation, REFLUX. NSG to reconsult if any change/decline in  status while admitted. MD updated. SLP Visit Diagnosis: Dysphagia, unspecified (R13.10) (baseline dysphagia including Esophageal phase Dysmotiltiy, REFLUX)    Aspiration Risk  Mild aspiration risk;Risk for inadequate nutrition/hydration (reduced following general aspiration precautions)    Diet Recommendation     Medication Administration: Whole meds with puree    Other  Recommendations Recommended Consults: Consider GI evaluation;Consider esophageal assessment (Dietician f/u) Oral Care Recommendations: Oral care BID;Oral care before and after PO;Staff/trained caregiver to provide oral care (support) Other Recommendations:  (n/a)    Recommendations for follow up therapy are one component of a multi-disciplinary discharge  planning process, led by the attending physician.  Recommendations may be updated based on patient status, additional functional criteria and insurance authorization.  Follow up Recommendations No SLP follow up      Assistance Recommended at Discharge Intermittent Supervision/Assistance (as needed)  Functional Status Assessment Patient has not had a recent decline in their functional status  Frequency and Duration  (n/a)   (n/a)       Prognosis Prognosis for Safe Diet Advancement: Fair Barriers to Reach Goals: Time post onset;Severity of deficits Barriers/Prognosis Comment: baseline dysphagia; Esophageal phase Dysmotility      Swallow Study   General Date of Onset: 12/12/21 HPI: Pt is a 81 y.o. male with medical history significant for hypertension, diabetes mellitus, coronary artery disease, A-fib status post Watchman device insertion, COPD who presents to the ER for evaluation of severe left leg pain and weakness.  Patient has had several hospitalizations and ER visits since 10/25/21 when he presented to the ER after a fall and had a comminuted, displaced and impacted intratrochanteric fracture of the left hip.  He is status post intramedullary nailing of the left femur.  His hospital course was complicated by A-fib with RVR and associated hypotension requiring IV pressors as well as acute blood loss anemia requiring blood transfusion.   He was discharged to a subacute rehab on 11/04/21 and returned back to the emergency room on 11/10/21 for evaluation of a period of unresponsiveness.    He was discharged on 11/16/21 and returned to the ER within 24 hours after discharge for evaluation of left leg pain.  Admission was requested but patient symptoms improved and he was discharged back to the skilled nursing facility for subacute rehab.    He was seen again in the ER on 11/27/2021 and discharged back to the skilled nursing facility.  Patient was discharged home from the skilled nursing facility 4  days prior to this admission and his son who is at the bedside states that he did not do well in rehab due to persistent left leg pain.  He was placed on tramadol for pain control which initially helped his pain but no longer provides any analgesic effect.  He states that patient's oral intake has been very poor and he has nausea all the time.  Since his discharge home he has been very weak and unable to ambulate and physical therapy came out to see him today but patient was unable to participate in therapy due to severe left leg pain prompting visit to the emergency room today.  He has not had any falls.  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 Type of Study: Bedside Swallow Evaluation Previous Swallow Assessment: 10/2021 Diet Prior to this Study: Dysphagia 3 (soft);Thin liquids  Temperature Spikes Noted: No (wbc 7.9) Respiratory Status: Room air History of Recent Intubation: No Behavior/Cognition: Alert;Cooperative;Pleasant mood Oral Cavity Assessment: Within Functional Limits Oral Care Completed by SLP: Yes Oral Cavity - Dentition: Poor condition;Missing dentition Vision: Functional for self-feeding Self-Feeding Abilities: Able to feed self;Needs assist;Needs set up Patient Positioning: Upright in bed (needed positioning support) Baseline Vocal Quality: Normal Volitional Cough: Strong Volitional Swallow: Able to elicit    Oral/Motor/Sensory Function Overall Oral Motor/Sensory Function: Within functional limits   Ice Chips Ice chips: Within functional limits Presentation: Spoon (fed; 2 trials)   Thin Liquid Thin Liquid: Within functional limits Presentation: Cup;Self Fed (10+ trials)    Nectar Thick Nectar Thick Liquid: Not tested   Honey Thick Honey Thick Liquid: Not tested   Puree Puree: Within functional  limits Presentation: Self Fed;Spoon (9 trials)   Solid     Solid: Impaired (missing some Dentition) Presentation: Self Fed (8 trials) Oral Phase Impairments:  (lengthier mastication) Pharyngeal Phase Impairments:  (none) Other Comments: moistened foods        Orinda Kenner, MS, CCC-SLP Speech Language Pathologist Rehab Services; [place] [phone] (ascom) Jahaan Vanwagner 12/13/2021,2:58 PM

## 2021-12-13 NOTE — Consult Note (Signed)
ORTHOPAEDIC CONSULTATION  REQUESTING PHYSICIAN: Hollice Espy, MD  Chief Complaint:   Left hip pain  History of Present Illness: Jeremy Herrera is a 81 y.o. male who was admitted for pneumonia and has a medical history of hypertension, diabetes, chronic artery disease, A-fib status post Watchman device insertion, COPD and left hip fracture with intramedullary nail fixation on 10/25/2021 with Dr. Allena Katz.  The patient has had persistent pain in his left leg and multiple ER visits for weakness and pain since the surgery.  He denies any new trauma to his left leg or any falls.   Past Medical History:  Diagnosis Date   Anemia    BPH (benign prostatic hyperplasia)    CHF (congestive heart failure) (HCC)    Diabetes mellitus without complication (HCC)    Dysphagia    GERD (gastroesophageal reflux disease)    HLD (hyperlipidemia)    HTN (hypertension)    Irregular heart beat    Nocturia    Urinary retention    Past Surgical History:  Procedure Laterality Date   AMPUTATION TOE Left 05/04/2017   Procedure: AMPUTATION TOE - third left toe;  Surgeon: Gwyneth Revels, DPM;  Location: [facility];  Service: Podiatry;  Laterality: Left;   bladder patching  1964   HIP SURGERY Right 2014   INTRAMEDULLARY (IM) NAIL INTERTROCHANTERIC Left 10/25/2021   Procedure: INTRAMEDULLARY (IM) NAIL INTERTROCHANTERIC;  Surgeon: Signa Kell, MD;  Location: [facility];  Service: Orthopedics;  Laterality: Left;   IRRIGATION AND DEBRIDEMENT FOOT Left 11/14/2016   Procedure: IRRIGATION AND DEBRIDEMENT FOOT;  Surgeon: Gwyneth Revels, DPM;  Location: [facility];  Service: Podiatry;  Laterality: Left;   TRANSURETHRAL RESECTION OF PROSTATE     Social History   Socioeconomic History   Marital status: Widowed    Spouse name: Not on file   Number of children: Not on file   Years of education: Not on file   Highest education level: Not on file  Occupational  History   Not on file  Tobacco Use   Smoking status: Never   Smokeless tobacco: Never  Vaping Use   Vaping Use: Never used  Substance and Sexual Activity   Alcohol use: No   Drug use: No   Sexual activity: Not Currently  Other Topics Concern   Not on file  Social History Narrative   Not on file   Social Determinants of Health   Financial Resource Strain: Not on file  Food Insecurity: No Food Insecurity (11/01/2021)   Hunger Vital Sign    Worried About Running Out of Food in the Last Year: Never true    Ran Out of Food in the Last Year: Never true  Transportation Needs: No Transportation Needs (11/01/2021)   PRAPARE - Administrator, Civil Service (Medical): No    Lack of Transportation (Non-Medical): No  Physical Activity: Not on file  Stress: Not on file  Social Connections: Not on file   Family History  Problem Relation Age of Onset   Ulcers Mother        stomach   GER disease Sister        GERD   Heart attack Father    Diabetes Brother    Depression Brother    Kidney disease Neg Hx    Prostate cancer Neg Hx    Kidney cancer Neg Hx    Bladder Cancer Neg Hx    Allergies  Allergen Reactions   Hydrocodone-Acetaminophen Nausea And Vomiting   Hydrocodone-Acetaminophen Nausea  And Vomiting    NOT true allergy   Prior to Admission medications   Medication Sig Start Date End Date Taking? Authorizing Provider  aspirin 81 MG tablet Take 81 mg by mouth daily.   Yes [provider]  ferrous sulfate 325 (65 FE) MG tablet Take 325 mg by mouth daily.   Yes [provider]  Fluticasone-Salmeterol (ADVAIR) 250-50 MCG/DOSE AEPB Inhale 1 puff into the lungs in the morning and at bedtime.   Yes [provider]  lidocaine (LIDODERM) 5 % Place 1 patch onto the skin every 12 (twelve) hours. Remove & Discard patch within 12 hours or as directed by MD 11/27/21 11/27/22 Yes Delton Prairie, MD  metoprolol succinate (TOPROL-XL) 25 MG 24 hr tablet Take 0.5  tablets (12.5 mg total) by mouth daily. Patient taking differently: Take 37.5 mg by mouth daily. 11/16/21  Yes Darlin Priestly, MD  midodrine (PROAMATINE) 10 MG tablet Take 1 tablet (10 mg total) by mouth 3 (three) times daily with meals. 11/04/21  Yes Amin, Loura Halt, MD  pantoprazole (PROTONIX) 40 MG tablet Take 1 tablet (40 mg total) by mouth 2 (two) times daily before a meal. 11/04/21  Yes Amin, Ankit Chirag, MD  rosuvastatin (CRESTOR) 10 MG tablet Take 1 tablet by mouth daily. 07/27/20 12/12/21 Yes [provider]  senna (SENOKOT) 8.6 MG TABS tablet Take 1 tablet (8.6 mg total) by mouth daily. 11/04/21  Yes Amin, Ankit Chirag, MD  sertraline (ZOLOFT) 25 MG tablet Take 25 mg by mouth daily.  08/31/18  Yes [provider]  umeclidinium-vilanterol (ANORO ELLIPTA) 62.5-25 MCG/INH AEPB Inhale 1 puff into the lungs daily as needed. Takes daily 08/05/18  Yes [provider]  vitamin B-12 (CYANOCOBALAMIN) 1000 MCG tablet Take 1 tablet (1,000 mcg total) by mouth daily. 08/19/20  Yes Rickard Patience, MD  albuterol (VENTOLIN HFA) 108 (90 Base) MCG/ACT inhaler Inhale 1-2 puffs into the lungs every 4 (four) hours as needed.  07/07/18   [provider]  bisacodyl (DULCOLAX) 10 MG suppository Place 1 suppository (10 mg total) rectally daily as needed for moderate constipation. 11/04/21   Amin, Loura Halt, MD  digoxin (LANOXIN) 0.25 MG tablet Take 1 tablet (0.25 mg total) by mouth daily. Patient not taking: Reported on 12/12/2021 11/16/21   Darlin Priestly, MD  docusate sodium (COLACE) 100 MG capsule Take 1 capsule (100 mg total) by mouth 2 (two) times daily as needed for mild constipation. 11/04/21   Amin, Loura Halt, MD  enoxaparin (LOVENOX) 40 MG/0.4ML injection Inject 0.4 mLs (40 mg total) into the skin at bedtime for 14 days. 11/16/21 11/30/21  Darlin Priestly, MD  feeding supplement (ENSURE ENLIVE / ENSURE PLUS) LIQD Take 237 mLs by mouth 2 (two) times daily between meals. 11/16/21   Darlin Priestly, MD   methocarbamol (ROBAXIN) 500 MG tablet Take 1 tablet (500 mg total) by mouth every 6 (six) hours as needed for muscle spasms. Patient not taking: Reported on 12/12/2021 10/28/21   Dedra Skeens, PA-C  ondansetron (ZOFRAN) 4 MG tablet Take 1 tablet (4 mg total) by mouth every 6 (six) hours as needed for nausea. Patient not taking: Reported on 12/12/2021 10/28/21   Dedra Skeens, PA-C  simethicone (MYLICON) 125 MG chewable tablet Chew 125 mg by mouth every 6 (six) hours as needed.     [provider]  Skin Protectants, Misc. (REMEDY PHYTOPLEX HYDRAGUARD) CREA Apply topically.    [provider]   US ARTERIAL ABI (SCREENING LOWER EXTREMITY)  Result Date:  12/13/2021 CLINICAL DATA:  Left leg pain. EXAM: NONINVASIVE PHYSIOLOGIC VASCULAR STUDY OF BILATERAL LOWER EXTREMITIES TECHNIQUE: Evaluation of both lower extremities were performed at rest, including calculation of ankle-brachial indices with single level Doppler, pressure and pulse volume recording. COMPARISON:  None Available. FINDINGS: Right ABI:  1.10 Left ABI:  1.10 Right Lower Extremity: Irregular waveforms at the right ankle. Some waveforms may be triphasic. Left Lower Extremity: Irregular waveforms at the left ankle but some of the waveforms may be triphasic or biphasic. 1.0-1.4 Normal IMPRESSION: Normal ankle-brachial indices bilaterally. Electronically Signed   By: Richarda OverlieAdam  Henn M.D.   On: 12/13/2021 12:49   DG FEMUR MIN 2 VIEWS LEFT  Result Date: 12/13/2021 CLINICAL DATA:  Left hip pain since intramedullary nail surgery on 10/25/2021, no known injury following surgery EXAM: PELVIS - 1-2 VIEW; LEFT FEMUR 2 VIEWS COMPARISON:  10/25/2021 FINDINGS: Osteopenia. Status post right hip total arthroplasty. Status post intramedullary nail fixation of intratrochanteric fractures of the left femur, with expected subacute appearance of fractures and near anatomic alignment. No evidence of perihardware fracture or component loosening. Nonobstructive  pattern of overlying bowel gas. IMPRESSION: Status post intramedullary nail fixation of intratrochanteric fractures of the left femur, with expected subacute appearance of fractures and near anatomic alignment. No evidence of perihardware fracture or component loosening. Electronically Signed   By: Jearld LeschAlex D Bibbey M.D.   On: 12/13/2021 09:18   DG Pelvis 1-2 Views  Result Date: 12/13/2021 CLINICAL DATA:  Left hip pain since intramedullary nail surgery on 10/25/2021, no known injury following surgery EXAM: PELVIS - 1-2 VIEW; LEFT FEMUR 2 VIEWS COMPARISON:  10/25/2021 FINDINGS: Osteopenia. Status post right hip total arthroplasty. Status post intramedullary nail fixation of intratrochanteric fractures of the left femur, with expected subacute appearance of fractures and near anatomic alignment. No evidence of perihardware fracture or component loosening. Nonobstructive pattern of overlying bowel gas. IMPRESSION: Status post intramedullary nail fixation of intratrochanteric fractures of the left femur, with expected subacute appearance of fractures and near anatomic alignment. No evidence of perihardware fracture or component loosening. Electronically Signed   By: Jearld LeschAlex D Bibbey M.D.   On: 12/13/2021 09:18   CT CHEST WO CONTRAST  Result Date: 12/12/2021 CLINICAL DATA:  Pneumonia, complication suspected, xray done EXAM: CT CHEST WITHOUT CONTRAST TECHNIQUE: Multidetector CT imaging of the chest was performed following the standard protocol without IV contrast. RADIATION DOSE REDUCTION: This exam was performed according to the departmental dose-optimization program which includes automated exposure control, adjustment of the mA and/or kV according to patient size and/or use of iterative reconstruction technique. COMPARISON:  Radiograph 12/12/2021, chest CT 10/25/2021 FINDINGS: Cardiovascular: Mild cardiomegaly. Coronary artery atherosclerosis. Left atrial appendage occlusion device aortic valve calcifications. Mild  atherosclerosis of the thoracic aorta. Normal size main and branch pulmonary arteries. Mediastinum/Nodes: There are few prominent subcarinal/paraesophageal lymph nodes, unchanged from prior exam, favored to be reactive.The thyroid is unremarkable. The trachea is unremarkable. Lungs/Pleura: Mild lower lung predominant bronchiectasis. There is airspace disease in the left lung base and centrilobular nodules. There are ground-glass opacities in the right middle and right lower lobes, in the region of prior airspace disease seen in September. Continued improvement in left upper lung airspace disease as well. Similar subpleural reticulation bilaterally. Unchanged 6 mm nodule in the left lower lobe (series 3, image 129). There is a new 5 mm pulmonary nodule in the superior segment of the left lower lobe (series 3, image 62). Upper Abdomen: No acute abnormality. Musculoskeletal: No acute osseous abnormality. No suspicious osseous lesion.  IMPRESSION: New airspace disease in the left lung base with adjacent centrilobular nodularity consistent with pneumonia, potentially aspiration. Improving airspace disease in the right middle lobe, right lower lobe, and left upper lobe. Stable 6 mm left lower pulmonary nodule. New 5 mm pulmonary nodule in the superior segment of the left lower lobe, potentially related to the infectious/inflammatory process. Recommend follow-up chest CT in 6 months. Electronically Signed   By: Caprice Renshaw M.D.   On: 12/12/2021 16:48   DG Chest 2 View  Result Date: 12/12/2021 CLINICAL DATA:  Weakness. Unable to stand. Recent discharge from rehab. EXAM: CHEST - 2 VIEW COMPARISON:  One view chest x-ray 11/17/2021 FINDINGS: Heart is enlarged. Atrial appendage device noted. Chronic interstitial coarsening is again noted. Ill-defined airspace opacities are noted in the posterior inferior lower lobes. Fused anterior syndesmophytes again noted. IMPRESSION: Ill-defined airspace opacities in the posterior  inferior lower lobes. This may represent atelectasis or infection. Electronically Signed   By: Marin Roberts M.D.   On: 12/12/2021 13:15    Positive ROS: All other systems have been reviewed and were otherwise negative with the exception of those mentioned in the HPI and as above.  Physical Exam: General:  Alert, no acute distress Psychiatric:  Patient is competent for consent with normal mood and affect   Cardiovascular:  No pedal edema Respiratory:  No wheezing, non-labored breathing GI:  Abdomen is soft and non-tender Skin:  No lesions in the area of chief complaint Neurologic:  Sensation intact distally left leg mildly weak Lymphatic:  No axillary or cervical lymphadenopathy  Orthopedic Exam:  Left lower extremity Well-healing incisions over the lateral hip and lateral knee from the hip nail No erythema or drainage from the wounds Able to logroll the leg with minimal pain, able to straight leg raise however with significant weakness of the hip flexor and some pain We will dorsiflex and plantarflex the foot DP pulse is not and neurovascularly intact distally  X-rays:  2 view x-rays of the left femur and pelvis taken today show status post right total hip arthroplasty with components in good position.  And left hip status post intramedullary nail fixation with expected changes after surgery and nail in good position and unchanged compared to outpatient imaging dated 11/30/2021.  No periprosthetic fractures or perihardware component loosening noted.   Assessment: S/p left hip intramedullary nail with routine healing  Plan: The patient's pain profile and weakness are consistent with a normal postoperative course after hip fracture and hip fracture nail insertion.  It can take more than 12 weeks to fully heal and begin to recover from a hip fracture and a long nail insertion.  The patient had a fairly comminuted fracture which is being held well with the current construct.  His  clinical exam shows that his hip articulation and joint are moving well and he will likely just need more time to improve.  He has deconditioned significantly and further physical therapy and Occupational Therapy will be necessary to help him regain his strength.  This will be a slow process and will take time for him to see the benefits.  Pain should continue to improve on a day-to-day basis.  Ambulation and weightbearing on the fracture and nail will help with deconditioning and fracture healing.  Patient may weight-bear as tolerated and continue physical therapy there is no plan for any further orthopedic surgical intervention at this time.  Case was discussed with Dr. Allena Katz who agrees with the above assessment.  The x-ray  and clinical exam findings were reviewed with the patient all of his questions were answered and he agrees with the above plan.     Steffanie Rainwater MD  Beeper #:  [phone]  12/13/2021 4:01 PM

## 2021-12-13 NOTE — Progress Notes (Signed)
[facility] Progress Note  Patient: Jeremy Herrera    UVO:[id number]  DOA: 12/12/2021    Date of Service: the patient was seen and examined on 12/13/2021  Brief hospital course: 81 year old male past medical history of hypertension, diabetes mellitus, CAD, atrial fibrillation status post Watchman device and COPD who had suffered a fall on 9/5 causing a comminuted displaced and impacted intertrochanteric fracture of the left hip and patient underwent IM nail.  Patient was discharged to skilled nursing facility.  Continue to have persistent left leg pain and presented to the emergency department on 10/23 for the third time with this complaint.  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  Patient brought in for further evaluation.  Assessment and Plan: Assessment and Plan: Leg pain Seen by orthopedic surgery.  I had extensive discussion with the patient and it appears that Ultram was controlling his pain, but the issues that he has been experiencing are due to working with physical therapy prior to getting or allowing time for Ultram to start.  He may benefit from some scheduled Ultram with as needed before physical therapy.  Orthopedic surgery feels that pain is to be expected as he had a significantly comminuted fracture.  Generalized weakness Patient is severely deconditioned following his left hip fracture repair. Physical therapy has been limited by pain in his left leg of an unclear etiology Place patient on fall precautions Consult orthopedic surgery PT eval once patient is stable  Dysphagia Patient seen by speech therapy.  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  Does not  appear to have any acute findings and appears to be at his baseline.  Speech therapy recommending dysphagia 3 diet.  Initially thought to have aspiration pneumonia based off of x-ray, however procalcitonin level normal so have discontinued antibiotics.  Atrial fibrillation (HCC) Continue metoprolol for rate control Patient not on anticoagulation due to increased risk for falls  Orthostatic hypotension Continue midodrine   COPD (chronic obstructive pulmonary disease) (HCC) Not acutely exacerbated Continue as needed bronchodilator therapy and inhaled steroids  Depression Continue sertraline  CAD (coronary artery disease) Continue aspirin, low-dose beta-blocker and statins  GERD without esophagitis Place patient on PPI  Malnutrition of moderate degree Moderate Malnutrition related to chronic illness (COPD, CHF, advanced age) as evidenced by moderate fat depletion, moderate muscle depletion. Continue Ensure Enlive p.o. 3 times daily as well as Magic cup 3 times daily with meals Nutrition to see        Body mass index is 21.83 kg/m.        Consultants: Palliative care Orthopedic surgery Nutrition Speech therapy  Procedures: None  Antimicrobials: IV Unasyn 10/23 - 10/24  Code Status: DNR   Subjective: Patient complains of some leg pain in general on his left side although says not hurting right now.  Objective: Vital signs were reviewed and unremarkable. Vitals:   12/13/21 0742 12/13/21 1533  BP:  125/62  Pulse: 84 (!) 55  Resp:  18  Temp: 97.7 F (36.5 C) 98 F (36.7 C)  SpO2: 97% 100%    Intake/Output Summary (Last 24 hours) at 12/13/2021 1630 Last data filed at 12/13/2021 0700 Gross per 24 hour  Intake 1171.48 ml  Output --  Net  1171.48 ml   Filed Weights   12/12/21 1150 12/12/21 1201  Weight: 100 kg 77.1 kg   Body mass index is 21.83 kg/m.  Exam:  General: Alert and oriented x2, no acute distress HEENT: Normocephalic, atraumatic, poor  dentition, mucous membranes are moist Cardiovascular: Irregular rhythm, rate controlled Respiratory: Clear to auscultation bilaterally Abdomen: Soft, nontender, nondistended, positive bowel sounds Musculoskeletal: No clubbing or cyanosis or edema. 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 Between 7PM-7AM, please contact night-coverage If you still have difficulty reaching the attending provider, please page the [facility] (Director on Call) for [facility] on amion for assistance.

## 2021-12-13 NOTE — Progress Notes (Signed)
PT Cancellation Note  Patient Details Name: Jeremy Herrera MRN: [id number] DOB: May 16, 1940   Cancelled Treatment:    Reason Eval/Treat Not Completed: Patient at procedure or test/unavailable. Orders received and chart reviewed. PT entering room receiving subjective info about to initiate evaluation. Prior to mobility, transport arriving to take pt to procedure. PT to re-attempt at later time/date as pt available and medically appropriate.    Salem Caster. Fairly IV, PT, DPT Physical Therapist- [facility]  12/13/2021, 11:55 AM

## 2021-12-13 NOTE — Plan of Care (Signed)
  Problem: Clinical Measurements: Goal: Diagnostic test results will improve Outcome: Progressing   Problem: Activity: Goal: Risk for activity intolerance will decrease Outcome: Progressing   Problem: Nutrition: Goal: Adequate nutrition will be maintained Outcome: Progressing   Problem: Coping: Goal: Level of anxiety will decrease Outcome: Progressing   

## 2021-12-13 NOTE — Assessment & Plan Note (Signed)
Seen by orthopedic surgery.  I had extensive discussion with the patient and it appears that Ultram was controlling his pain, but the issues that he has been experiencing are due to working with physical therapy prior to getting or allowing time for Ultram to start.  Schedule Ultram causing increased somnolence.  Patient seems to be responding well to scheduled Robaxin with as needed Ultram.  Orthopedic surgery feels that pain is to be expected as he had a significantly comminuted fracture.  Appreciate palliative care help.

## 2021-12-13 NOTE — Assessment & Plan Note (Signed)
Patient seen by speech therapy.  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  Does not appear to have any acute findings and appears to be at his baseline.  Speech therapy recommending dysphagia 3 diet.  Initially thought to have aspiration pneumonia based off of x-ray, however procalcitonin level normal so have discontinued antibiotics.

## 2021-12-13 NOTE — Hospital Course (Signed)
81 year old male past medical history of hypertension, diabetes mellitus, CAD, atrial fibrillation status post Watchman device and COPD who had suffered a fall on 9/5 causing a comminuted displaced and impacted intertrochanteric fracture of the left hip and patient underwent IM nail.  Patient was discharged to skilled nursing facility.  Continue to have persistent left leg pain and presented to the emergency department on 10/23 for the third time with this complaint.  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  Patient brought in for further evaluation.

## 2021-12-13 NOTE — Evaluation (Signed)
Physical Therapy Evaluation Patient Details Name: Jeremy Herrera MRN: [id number] DOB: February 04, 1941 Today's Date: 12/13/2021  History of Present Illness  Jeremy Herrera is a 81 y.o. male with medical history significant for hypertension, diabetes mellitus, coronary artery disease, A-fib status post Watchman device insertion, COPD who presents to the ER for evaluation of severe left leg pain, weakness, malnutrition, and aspiration pneumonia . Patient has had several hospitalizations and ER visits since 10/25/21 when he presented to the ER after a fall and had a comminuted, displaced and impacted intratrochanteric fracture of the left hip.  He is status post intramedullary nailing of the left femur.  His hospital course was complicated by A-fib with RVR and associated hypotension requiring IV pressors as well as acute blood loss anemia requiring blood transfusion.   He was discharged to a subacute rehab on 11/04/21 and returned back to the emergency room on 11/10/21 for evaluation of a period of unresponsiveness.  He was discharged on 11/16/21 and returned to the ER within 24 hours after discharge for evaluation of left leg pain.  Admission was requested but patient symptoms improved and he was discharged back to the skilled nursing facility for subacute rehab.    He was seen again in the ER on 11/27/2021 and discharged back to the skilled nursing facility.  Patient was discharged home from the skilled nursing facility 4 days prior to this admission and his son who is at the bedside states that he did not do well in rehab due to persistent left leg pain.  Clinical Impression  Pt presents in chair position in bed with son present to help to answer questions and with patient agreeable to PT. Pt exhibits decreased mobility requiring increased assistance to reach chair from bed. Son explained how family is able to provide assistance most of the time along with paid care provider, but there is gap in care that  cannot be currently filled where patient has to remain home alone. Son is concerned that his father is not safe to be home alone, because he has gotten weaker since returning home from last bout of SNF. Pt also demonstrates decreased strength in LLE compared to RLE which is probably the resulting deficits of left hip fracture. Min VC for patient to progress feet in stand step pivot transfer, which occurred 2x from chair and back to bed, with increased difficulty going towards LLE. He will benefit from ongoing skilled PT in house along with discharge to SNF with skilled PT to address aforementioned deficits and to maintain patient safety.      Recommendations for follow up therapy are one component of a multi-disciplinary discharge planning process, led by the attending physician.  Recommendations may be updated based on patient status, additional functional criteria and insurance authorization.  Follow Up Recommendations Skilled nursing-short term rehab (<3 hours/day)      Assistance Recommended at Discharge    Patient can return home with the following  A lot of help with bathing/dressing/bathroom;A lot of help with walking and/or transfers;Assistance with cooking/housework;Assist for transportation    Equipment Recommendations BSC/3in1  Recommendations for Other Services       Functional Status Assessment Patient has had a recent decline in their functional status and demonstrates the ability to make significant improvements in function in a reasonable and predictable amount of time.     Precautions / Restrictions Precautions Precautions: Fall Restrictions Weight Bearing Restrictions: No      Mobility  Bed Mobility Overal bed mobility: Modified Independent  Transfers Overall transfer level: Needs assistance Equipment used: Rolling walker (2 wheels) Transfers: Sit to/from Stand, Bed to chair/wheelchair/BSC Sit to Stand: Mod assist   Step pivot transfers:  Min guard x 2 from bed<>chair             Ambulation/Gait                  Stairs            Wheelchair Mobility    Modified Rankin (Stroke Patients Only)       Balance Overall balance assessment: Needs assistance Sitting-balance support: Feet unsupported, No upper extremity supported Sitting balance-Leahy Scale: Good     Standing balance support: Bilateral upper extremity supported, Reliant on assistive device for balance Standing balance-Leahy Scale: Fair                               Pertinent Vitals/Pain Pain Assessment Pain Assessment: 0-10 Pain Score: 2  Pain Location: Left Hip Pain Descriptors / Indicators: Aching Pain Intervention(s): Monitored during session    Home Living Family/patient expects to be discharged to:: Private residence Living Arrangements: Children Available Help at Discharge: Family;Available PRN/intermittently Type of Home: House Home Access: Stairs to enter Entrance Stairs-Rails: None Entrance Stairs-Number of Steps: 1   Home Layout: One level Home Equipment: Conservation officer, nature (2 wheels);Rollator (4 wheels);Cane - single point Additional Comments: Was home receiving HHPT prior to admission    Prior Function Prior Level of Function : Needs assist       Physical Assist : Mobility (physical);ADLs (physical) Mobility (physical): Transfers ADLs (physical): Feeding;Bathing;Toileting;IADLs;Grooming Mobility Comments: uses rollator for ambulation; 2 falls in previous year ADLs Comments: mostly IND in BADL, showers INDly but only when daughter is home; family, caregiver provides transportation and assistance with IADL     Hand Dominance   Dominant Hand: Right    Extremity/Trunk Assessment   Upper Extremity Assessment Upper Extremity Assessment: Defer to OT evaluation    Lower Extremity Assessment Lower Extremity Assessment: LLE deficits/detail LLE Deficits / Details: Decreased left hip flexion and knee  extension compared to RLE and more difficult to progress while taking steps LLE Coordination: WNL       Communication   Communication: HOH  Cognition Arousal/Alertness: Awake/alert Behavior During Therapy: WFL for tasks assessed/performed Overall Cognitive Status: Within Functional Limits for tasks assessed                                          General Comments      Exercises Other Exercises Other Exercises: Long arch quads 2 x 10 (LLE harder to perform) Other Exercises: Quad Sets with 3 sec hold 1 x 10   Assessment/Plan    PT Assessment Patient needs continued PT services  PT Problem List Decreased strength;Decreased balance;Decreased mobility;Decreased activity tolerance       PT Treatment Interventions DME instruction;Gait training;Stair training;Balance training;Therapeutic exercise;Neuromuscular re-education;Therapeutic activities;Patient/family education    PT Goals (Current goals can be found in the Care Plan section)  Acute Rehab PT Goals Patient Stated Goal: To return home safely PT Goal Formulation: With patient/family Time For Goal Achievement: 12/27/21 Potential to Achieve Goals: Fair    Frequency Min 2X/week     Co-evaluation               AM-PAC PT "6 Clicks" Mobility  Outcome Measure Help needed turning from your back to your side while in a flat bed without using bedrails?: A Little Help needed moving from lying on your back to sitting on the side of a flat bed without using bedrails?: A Little Help needed moving to and from a bed to a chair (including a wheelchair)?: A Lot Help needed standing up from a chair using your arms (e.g., wheelchair or bedside chair)?: A Lot Help needed to walk in hospital room?: A Lot Help needed climbing 3-5 steps with a railing? : Total 6 Click Score: 13    End of Session Equipment Utilized During Treatment: Gait belt Activity Tolerance: Patient tolerated treatment well;No increased  pain Patient left: in bed;with call bell/phone within reach;with bed alarm set Nurse Communication: Mobility status PT Visit Diagnosis: Unsteadiness on feet (R26.81);Other abnormalities of gait and mobility (R26.89);Repeated falls (R29.6);Muscle weakness (generalized) (M62.81);History of falling (Z91.81)    Time: [id number] PT Time Calculation (min) (ACUTE ONLY): 33 min   Charges:   PT Evaluation $PT Eval Moderate Complexity: 1 Mod PT Treatments $Therapeutic Activity: 8-22 mins      Bradly Chris PT, DPT  12/13/2021, 4:04 PM

## 2021-12-14 DIAGNOSIS — M79605 Pain in left leg: Secondary | ICD-10-CM | POA: Diagnosis not present

## 2021-12-14 DIAGNOSIS — N401 Enlarged prostate with lower urinary tract symptoms: Secondary | ICD-10-CM

## 2021-12-14 DIAGNOSIS — I4811 Longstanding persistent atrial fibrillation: Secondary | ICD-10-CM

## 2021-12-14 DIAGNOSIS — J449 Chronic obstructive pulmonary disease, unspecified: Secondary | ICD-10-CM

## 2021-12-14 DIAGNOSIS — R531 Weakness: Secondary | ICD-10-CM | POA: Diagnosis not present

## 2021-12-14 DIAGNOSIS — N138 Other obstructive and reflux uropathy: Secondary | ICD-10-CM

## 2021-12-14 DIAGNOSIS — E43 Unspecified severe protein-calorie malnutrition: Secondary | ICD-10-CM | POA: Diagnosis present

## 2021-12-14 DIAGNOSIS — R1314 Dysphagia, pharyngoesophageal phase: Secondary | ICD-10-CM | POA: Diagnosis not present

## 2021-12-14 MED ORDER — ADULT MULTIVITAMIN W/MINERALS CH
1.0000 | ORAL_TABLET | Freq: Every day | ORAL | Status: DC
  Administered 2021-12-14 – 2021-12-16 (×3): 1 via ORAL
  Filled 2021-12-14 (×3): qty 1

## 2021-12-14 MED ORDER — PROSOURCE PLUS PO LIQD
30.0000 mL | Freq: Two times a day (BID) | ORAL | Status: DC
  Administered 2021-12-14 – 2021-12-16 (×5): 30 mL via ORAL
  Filled 2021-12-14 (×6): qty 30

## 2021-12-14 MED ORDER — METHOCARBAMOL 500 MG PO TABS
500.0000 mg | ORAL_TABLET | Freq: Three times a day (TID) | ORAL | Status: DC
  Administered 2021-12-14 – 2021-12-16 (×5): 500 mg via ORAL
  Filled 2021-12-14 (×5): qty 1

## 2021-12-14 NOTE — TOC Initial Note (Addendum)
Transition of Care [facility]) - Initial/Assessment Note    Patient Details  Name: Jeremy Herrera MRN: [id number] Date of Birth: [birth year]  Transition of Care [facility] CM/SW Contact:    Chapman Fitch, RN Phone Number: 12/14/2021, 4:00 PM  Clinical Narrative:                  Admitted for: Leg pain and weakness Admitted from: home.  Recently discharge from [facility] 10/19 PCP: Feldpausch    Therapy recommending SNF.  Patient defers to son, son defers to daughter.   Daughter agreeable and bed search started Existing PASSR Fl2 sent for signature Bed Search started   1st preferences [facility] [place] [facility] [place] review, message left for admissions at [facility] [place]        Patient Goals and CMS Choice        Expected Discharge Plan and Services                                                Prior Living Arrangements/Services                       Activities of Daily Living      Permission Sought/Granted                  Emotional Assessment              Admission diagnosis:  Generalized weakness [R53.1] Chronic pain of left lower extremity [M79.605, G89.29] Aspiration pneumonia of both lower lobes, unspecified aspiration pneumonia type [facility]) [J69.0] Patient Active Problem List   Diagnosis Date Noted   Protein-calorie malnutrition, severe 12/14/2021   Pain in bone fixation device, initial encounter (HCC)    Generalized weakness 12/12/2021   Orthostatic hypotension 11/11/2021   Unresponsiveness 11/10/2021   Dyslipidemia 11/10/2021   GERD without esophagitis 11/10/2021   Weakness    Slurred speech    Atrial fibrillation (HCC) 10/26/2021   Malnutrition of moderate degree 10/26/2021   Hypotension after procedure    Closed displaced intertrochanteric fracture of left femur (HCC) 10/25/2021   Diastolic CHF, chronic (HCC) 10/25/2021   COPD (chronic obstructive pulmonary disease) (HCC)  10/25/2021   Atrial fibrillation, chronic (HCC) 10/25/2021   Mild aortic valve stenosis 11/05/2019   Acute respiratory failure with hypoxia (HCC) 05/16/2019   CAP (community acquired pneumonia) 05/16/2019   HLD (hyperlipidemia) 05/16/2019   Depression 05/16/2019   Lower limb ulcer, calf, left, limited to breakdown of skin (HCC) 05/06/2019   Swelling of limb 05/06/2019   CAD (coronary artery disease) 08/07/2018   Erythema of lower extremity 08/07/2018   Mild protein-calorie malnutrition (HCC) 06/18/2018   Acute respiratory distress 06/12/2018   Dyspnea 06/12/2018   Altered mental status 08/19/2017   Headache 08/19/2017   Tinnitus 08/19/2017   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HCC) 05/02/2017   Unsteadiness 04/12/2017   Lung nodule 03/11/2017   Nausea 12/18/2016   Diabetic foot ulcer (HCC) 11/13/2016   Leg pain 05/07/2016   Acute on chronic combined systolic and diastolic heart failure (HCC) 05/06/2016   Atrial fibrillation with rapid ventricular response (HCC) 05/06/2016   High risk medication use 11/23/2015   Dysphagia 10/02/2015   Loss of memory 04/29/2015   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   Venous insufficiency of both lower extremities 12/17/2014  Bilateral lower extremity edema 11/23/2014   Nocturia 10/17/2014   History of urinary retention 10/17/2014   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   B12 deficiency 09/30/2014   Right leg swelling 09/30/2014   Anemia 09/16/2014   Thrombocytopenia ([place]) 09/16/2014   Benign essential HTN 06/08/2014   Combined fat and carbohydrate induced hyperlipemia 06/08/2014   Beat, premature ventricular 12/03/2013   Heart valve disease 12/03/2013   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   Ventricular premature depolarization 12/03/2013   Benign fibroma of prostate 11/20/2013   Acid reflux 11/20/2013   Anemia, iron deficiency 11/20/2013   Abnormal presence of protein in urine 11/20/2013   Peripheral sensory neuropathy  11/20/2013   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

## 2021-12-14 NOTE — NC FL2 (Signed)
[place] LEVEL OF CARE SCREENING TOOL     IDENTIFICATION  Patient Name: Jeremy Herrera Birthdate: [birth year] Sex: male Admission Date (Current Location): 12/12/2021  [facility] [place] Number:  Engineering geologist and Address:         Provider Number: [id number]  Attending Physician Name and Address:  Annita Brod, MD  Relative Name and Phone Number:       Current Level of Care: Hospital Recommended Level of Care: [place] Prior Approval Number:    Date Approved/Denied:   PASRR Number: [id number]  Discharge Plan: SNF    Current Diagnoses: Patient Active Problem List   Diagnosis Date Noted   Protein-calorie malnutrition, severe 12/14/2021   Pain in bone fixation device, initial encounter ([place])    Generalized weakness 12/12/2021   Orthostatic hypotension 11/11/2021   Unresponsiveness 11/10/2021   Dyslipidemia 11/10/2021   GERD without esophagitis 11/10/2021   Weakness    Slurred speech    Atrial fibrillation ([place]) 10/26/2021   Malnutrition of moderate degree 10/26/2021   Hypotension after procedure    Closed displaced intertrochanteric fracture of left femur ([place]) [id number]   Diastolic CHF, chronic ([place]) 10/25/2021   COPD (chronic obstructive pulmonary disease) ([place]) 10/25/2021   Atrial fibrillation, chronic ([place]) 10/25/2021   Mild aortic valve stenosis 11/05/2019   Acute respiratory failure with hypoxia ([place]) 05/16/2019   CAP (community acquired pneumonia) 05/16/2019   HLD (hyperlipidemia) 05/16/2019   Depression 05/16/2019   Lower limb ulcer, calf, left, limited to breakdown of skin ([place]) 05/06/2019   Swelling of limb 05/06/2019   CAD (coronary artery disease) 08/07/2018   Erythema of lower extremity 08/07/2018   Mild protein-calorie malnutrition ([place]) 06/18/2018   Acute respiratory distress 06/12/2018   Dyspnea 06/12/2018   Altered mental status 08/19/2017   Headache 08/19/2017   Tinnitus 08/19/2017    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[place]) 05/02/2017   Unsteadiness 04/12/2017   Lung nodule 03/11/2017   Nausea 12/18/2016   Diabetic foot ulcer ([place]) 11/13/2016   Leg pain 05/07/2016   Acute on chronic combined systolic and diastolic heart failure ([place]) 05/06/2016   Atrial fibrillation with rapid ventricular response ([place]) 05/06/2016   High risk medication use 11/23/2015   Dysphagia 10/02/2015   Loss of memory 04/29/2015   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   Venous insufficiency of both lower extremities 12/17/2014   Bilateral lower extremity edema 11/23/2014   Nocturia 10/17/2014   History of urinary retention 10/17/2014   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   B12 deficiency 09/30/2014   Right leg swelling 09/30/2014   Anemia 09/16/2014   Thrombocytopenia ([place]) 09/16/2014   Benign essential HTN 06/08/2014   Combined fat and carbohydrate induced hyperlipemia 06/08/2014   Beat, premature ventricular 12/03/2013   Heart valve disease 12/03/2013   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   Ventricular premature depolarization 12/03/2013   Benign fibroma of prostate 11/20/2013   Acid reflux 11/20/2013   Anemia, iron deficiency 11/20/2013   Abnormal presence of protein in urine 11/20/2013   Peripheral sensory neuropathy 11/20/2013   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    Orientation RESPIRATION BLADDER Height & Weight     Self, Time, Situation, Place  Normal Incontinent Weight: 77.1 kg Height:  6\' 2"  (188 cm)  BEHAVIORAL SYMPTOMS/MOOD NEUROLOGICAL BOWEL NUTRITION STATUS      Incontinent Diet (dys 3)  AMBULATORY STATUS COMMUNICATION OF NEEDS Skin   Extensive Assist Verbally Bruising  Personal Care Assistance Level of Assistance              Functional Limitations Info             SPECIAL CARE FACTORS FREQUENCY  PT (By licensed PT), OT (By licensed OT)                    Contractures Contractures Info: Not present    Additional  Factors Info  Code Status, Allergies Code Status Info: DNR Allergies Info: Hydrocodone-acetaminophen           Current Medications (12/14/2021):  This is the current hospital active medication list Current Facility-Administered Medications  Medication Dose Route Frequency Provider Last Rate Last Admin   (feeding supplement) PROSource Plus liquid 30 mL  30 mL Oral BID BM Annita Brod, MD   30 mL at 12/14/21 1553   albuterol (PROVENTIL) (2.5 MG/3ML) 0.083% nebulizer solution 3 mL  3 mL Nebulization Q4H PRN Agbata, Tochukwu, MD       aspirin EC tablet 81 mg  81 mg Oral Daily Agbata, Tochukwu, MD   81 mg at 12/14/21 0935   bisacodyl (DULCOLAX) suppository 10 mg  10 mg Rectal Daily PRN Agbata, Tochukwu, MD       cyanocobalamin (VITAMIN B12) tablet 1,000 mcg  1,000 mcg Oral Daily Agbata, Tochukwu, MD   1,000 mcg at 12/14/21 0936   docusate sodium (COLACE) capsule 100 mg  100 mg Oral BID PRN Agbata, Tochukwu, MD       enoxaparin (LOVENOX) injection 40 mg  40 mg Subcutaneous Q24H Agbata, Tochukwu, MD   40 mg at 12/13/21 2146   feeding supplement (ENSURE ENLIVE / ENSURE PLUS) liquid 237 mL  237 mL Oral TID Agbata, Tochukwu, MD   237 mL at 12/14/21 1554   ferrous sulfate tablet 325 mg  325 mg Oral Daily Agbata, Tochukwu, MD   325 mg at 12/14/21 0935   methocarbamol (ROBAXIN) tablet 500 mg  500 mg Oral Q8H Jordan Hawks, FNP       metoprolol succinate (TOPROL-XL) 24 hr tablet 37.5 mg  37.5 mg Oral Daily Agbata, Tochukwu, MD   37.5 mg at 12/14/21 0934   midodrine (PROAMATINE) tablet 10 mg  10 mg Oral TID WC Agbata, Tochukwu, MD   10 mg at 12/14/21 1329   mometasone-formoterol (DULERA) 200-5 MCG/ACT inhaler 2 puff  2 puff Inhalation BID Agbata, Tochukwu, MD   2 puff at 12/14/21 0806   multivitamin with minerals tablet 1 tablet  1 tablet Oral Daily Annita Brod, MD   1 tablet at 12/14/21 1125   ondansetron (ZOFRAN) tablet 4 mg  4 mg Oral Q6H PRN Agbata, Tochukwu, MD       Or   ondansetron  (ZOFRAN) injection 4 mg  4 mg Intravenous Q6H PRN Agbata, Tochukwu, MD       pantoprazole (PROTONIX) EC tablet 40 mg  40 mg Oral BID AC Agbata, Tochukwu, MD   40 mg at 12/14/21 0804   rosuvastatin (CRESTOR) tablet 10 mg  10 mg Oral Daily Agbata, Tochukwu, MD   10 mg at 12/14/21 0935   senna (SENOKOT) tablet 8.6 mg  1 tablet Oral Daily Agbata, Tochukwu, MD   8.6 mg at 12/14/21 0936   sertraline (ZOLOFT) tablet 25 mg  25 mg Oral Daily Agbata, Tochukwu, MD   25 mg at 12/14/21 0935   simethicone (MYLICON) chewable tablet 120 mg  120 mg Oral Q6H PRN Collier Bullock, MD  traMADol (ULTRAM) tablet 50 mg  50 mg Oral Q6H PRN Annita Brod, MD         Discharge Medications: Please see discharge summary for a list of discharge medications.  Relevant Imaging Results:  Relevant Lab Results:   Additional Information SS# [id number]  Beverly Sessions, RN

## 2021-12-14 NOTE — Progress Notes (Signed)
Initial Nutrition Assessment  DOCUMENTATION CODES:   Severe malnutrition in context of chronic illness  INTERVENTION:   -Continue Ensure Enlive po TID, each supplement provides 350 kcal and 20 grams of protein -30 ml Prosource BID, each supplement provides 100 kcals and 15 grams protein -MVI with minerals daily -Magic cup TID with meals, each supplement provides 290 kcal and 9 grams of protein   NUTRITION DIAGNOSIS:   Severe Malnutrition related to chronic illness (COPD) as evidenced by percent weight loss, moderate fat depletion, severe fat depletion, moderate muscle depletion, severe muscle depletion.  GOAL:   Patient will meet greater than or equal to 90% of their needs  MONITOR:   PO intake, Supplement acceptance, Diet advancement  REASON FOR ASSESSMENT:   Consult Assessment of nutrition requirement/status  ASSESSMENT:   Pt with medical history significant for hypertension, diabetes mellitus, coronary artery disease, A-fib status post Watchman device insertion, COPD who presents to for evaluation of severe left leg pain and weakness.  Pt admitted with generalized weakness and aspiration pneumonia.   10/24- s/p BSE- advanced to dysphagia 3 diet with thin liquids  Reviewed I/O's: -310 ml x 24 hours and +962 ml since admission  UOP: 550 ml x 24 hours  Per SLP notes, recommending GI consult for evaluation of esophageal dysmotility.   Per orthopedics notes, no plans for further surgical interventions.   Pt sleeping soundly with no supports at bedside. He did not arouse to touch.   Observed breakfast tray at bedside, which was untouched. Pt did consume a few sips of Ensure.   Reviewed wt hx; pt has experienced a 17.1% wt loss over the past month, which is significant for time frame.   Per palliative care notes, pt does not desire a feeding tube. Pt would greatly benefit from addition of oral nutrition supplements to help optimize oral intake.   Medications reviewed  and include ferrous sulfate, vitamin B-12 and senokot.   Lab Results  Component Value Date   HGBA1C 5.3 11/14/2016   PTA DM medications are none.   Labs reviewed: CBGS: 155 (inpatient orders for glycemic control are none).    NUTRITION - FOCUSED PHYSICAL EXAM:  Flowsheet Row Most Recent Value  Orbital Region Severe depletion  Upper Arm Region Moderate depletion  Thoracic and Lumbar Region Severe depletion  Buccal Region Severe depletion  Temple Region Severe depletion  Clavicle Bone Region Severe depletion  Clavicle and Acromion Bone Region Severe depletion  Scapular Bone Region Severe depletion  Dorsal Hand Moderate depletion  Patellar Region Moderate depletion  Anterior Thigh Region Moderate depletion  Posterior Calf Region Moderate depletion  Edema (RD Assessment) None  Hair Reviewed  Eyes Reviewed  Mouth Reviewed  Skin Reviewed  Nails Reviewed       Diet Order:   Diet Order             DIET DYS 3 Room service appropriate? Yes with Assist; Fluid consistency: Thin  Diet effective now                   EDUCATION NEEDS:   Education needs have been addressed  Skin:  Skin Assessment: Reviewed RN Assessment  Last BM:  12/12/21 (type 6)  Height:   Ht Readings from Last 1 Encounters:  12/12/21 6\' 2"  (1.88 m)    Weight:   Wt Readings from Last 1 Encounters:  12/12/21 77.1 kg    Ideal Body Weight:  86.4 kg  BMI:  Body mass index is 21.83 kg/m.  Estimated Nutritional Needs:   Kcal:  2150-2350  Protein:  115-130 grams  Fluid:  > 2 L    Loistine Chance, RD, LDN, [place] Registered Dietitian II Certified Diabetes Care and Education Specialist Please refer to [facility] for RD and/or RD on-call/weekend/after hours pager

## 2021-12-14 NOTE — Progress Notes (Signed)
Palliative Care Progress Note, Assessment & Plan   Patient Name: Jeremy Herrera       Date: 12/14/2021 DOB: [birth year]  Age: 81 y.o. MRN#: [id number] Attending Physician: Annita Brod, MD Primary Care Physician: Sofie Hartigan, MD Admit Date: 12/12/2021  Reason for Consultation/Follow-up: Establishing goals of care  Subjective: Patient is lying in bed resting in no apparent distress.  He is easily awakened but is groggy reports he is "very sleepy".  No family present at bedside.  HPI: 81 y.o. male  with past medical history of HTN, type II day Beatties, CHF, CAD, COPD, A-fib (status post Watchman), and fall with subsequent left femur IM nail placement admitted on 12/12/2021 with weakness and leg pain.  This is patient's third inpatient admission in the last 6 months.    PMT was consulted to discuss goals of care.  Summary of counseling/coordination of care: After reviewing the patient's chart and assessing the patient at bedside, I discussed patient's medication regimen with bedside RN and patient.  Patient endorses that his pain has been well controlled since his adjustment of medications yesterday.  However, he says he just feels extremely sleepy and has difficulty staying awake.  In review of patient's medications, I would recommend that Ultram be given in smaller doses as needed and that Robaxin be scheduled every 8 hours. Patient endorsed that Robaxin was effective in helping minimize pain and discomfort yesterday without such strong sedative effects.   Discussed adjustments in robaxin and tramadol with attending, who was in agreement, and changes made to reflect:  Tramadol 50mg  PO Q6H PRN Robaxin 500mg  PO Q8H  Goals remains clear. MOST form in place reflects patient's current  wishes. Surrogate decision maker is established in ACP documents.  DNR remains.  PMT will continue to follow peripherally for pain/symptom management.   Physical Exam Constitutional:      General: He is not in acute distress.    Appearance: Normal appearance. He is not toxic-appearing.  HENT:     Head: Normocephalic.     Mouth/Throat:     Mouth: Mucous membranes are moist.  Eyes:     Pupils: Pupils are equal, round, and reactive to light.  Cardiovascular:     Rate and Rhythm: Normal rate. Rhythm irregular.     Pulses: Normal pulses.  Pulmonary:     Effort: Pulmonary effort is normal.  Abdominal:     Palpations: Abdomen is soft.  Musculoskeletal:     Comments: MAETC  Neurological:     Mental Status: He is alert and oriented to person, place, and time.  Psychiatric:        Mood and Affect: Mood normal.        Behavior: Behavior normal.        Thought Content: Thought content normal.        Judgment: Judgment normal.             Palliative Assessment/Data: 60%    Total Time 50 minutes  Greater than 50%  of this time was spent counseling and coordinating care related to the above assessment and plan.  Thank you for allowing the Palliative Medicine Team to assist in the care of this patient.  [place] Ilsa Iha, FNP-BC Palliative Medicine Team Team Phone # [phone]

## 2021-12-14 NOTE — Assessment & Plan Note (Addendum)
Nutrition Status: Nutrition Problem: Severe Malnutrition Etiology: chronic illness (COPD) Signs/Symptoms: percent weight loss, moderate fat depletion, severe fat depletion, moderate muscle depletion, severe muscle depletion Interventions: Ensure Enlive (each supplement provides 350kcal and 20 grams of protein), MVI, Prostat, Magic cup  Patient previously with moderate protein calorie malnutrition so this indicates worsening  Nutrition following

## 2021-12-14 NOTE — Progress Notes (Signed)
Speech Language Pathology Treatment: Dysphagia  Patient Details Name: Jeremy Herrera MRN: [id number] DOB: [birth year] Today's Date: 12/14/2021 Time: 1505-6979 SLP Time Calculation (min) (ACUTE ONLY): 45 min  Assessment / Plan / Recommendation Clinical Impression  Pt seen for ongoing f/u for toleration of diet and education of aspiration precautions today. Pt alert; Mild Confusion but attended appropriately given min cues -- has Baseline Cognitive decline per chart. Pt also has Baseline of REFLUX/GERD; dysphagia. Missing Dentition at baseline. Noted his tic-like throat clears/phonations during conversation IN ABSENCE of po's. This is Baseline for pt per previous evaluation.  On RA; afebrile, WBC WNL.    Pt does have baseline dysphagia evidenced by results of the MBSS 03/16/15: "moderate oropharyngeal dysphagia consistent with neuromuscular impairment as well as the effects of Laryngopharyngeal Reflux (LPR - inflammation, edema, and resultant decreased sensation of the larynx and pharynx). Pt also had esophageal dilation ~10 years ago which he reported alleviated several of his dysphagia symptoms but suspects this could be an ongoing issue for him.". No further f/u w/ GI nor endoscopy noted in chart.    Pt and Family have reported that pt throat clears consistently throughout the day IN ABSENCE OF oral intake. This appears to be a "tic-like" behavior.  Pt can describe and state the swallow strategies he utilizes including small bites and sips, thorough mastication, subsequent swallows, and alternating foods/liquids(liquid washes) to "help me not choke".    For the Lunch meal today, pt was supported w/ min more upright sitting w/ pillows low behind back, not just behind head to bunch him over. He consumed po trials of puree, softened solids (d/t poor Dentition status), and thin liquids via Cup/straw (he stated he drinks from a straw at home d/t difficulty holding a cup w/ his weak hands) w/ No  immediate coughing noted; no decline in vocal quality or respiratory status during po trials. (Noted the mild throat clearing behaviors -- tic-like behaviors intermittently during trials which did not increase or seem immediate to the oral intake). Pt stated he is aware of the need to follow strategies and aspiration precautions to reduce his risk for aspiration.   With small boluses of thin liquids and soft, moist foods, pt feels he "manages ok". He stated he "knows" that he "has more trouble" eating tougher foods, "so I have to be more careful".  Neuromuscular impairment as well as the effects of laryngopharyngeal reflux (inflammation, edema, and resultant decreased sensation of the larynx and pharynx) were suggested on MBSS. Pt has reported hx of Reflux for which he takes Nexium.  Increased sedentary status w/ Chronic dysphagia could increase risk for aspiration pneumonia. Encouraged use of Incentive Spirometer for respiratory ex d/t sedentary status as was given last week+ at admit. Pt appears at/close to his baseline from last admit ~1-2 weeks ago in setting of baseline dysphagia including Esophageal phase Dysmotiltiy, REFLUX.   Recommend continue a more dysphagia 3 diet with thin liquids and adherence to aspiration/REFLUX precautions and compensatory swallowing strategies in setting of Chronic dysphagia/GERD, and to reduce risk for aspiration/aspiration pneumonia. Pt is aware of this. Pt is able to state back swallowing strategies and precautions he follows. Recommend rest breaks during meals; No Talking during oral intake. Setup support at meals. Pills in Puree for swallowing and clearing of the Esophagus. Encouraged mobility and use of a Dysphagia drink cup in order to lessen risk of aspiration, as was given last admit.  Recommend f/u w/ GI/ENT if any further c/o Esophageal phase issues/food  dysmotility as he has the h/o Esophageal dilation, REFLUX. ST services can be available if new needs arise  during admit. Recommend discussion w/ Palliative Care re: Chronic medical issues and GOC moving forward.  Precautions posted in room. Handouts given on dysphagia drink cup and aspiration precautions again; Incentive Spirometer. MD updated.     HPI HPI: Pt is a 81 y.o. male with medical history significant for hypertension, diabetes mellitus, coronary artery disease, A-fib status post Watchman device insertion, COPD who presents to the ER for evaluation of severe left leg pain and weakness.  Patient has had several hospitalizations and ER visits since 10/25/21 when he presented to the ER after a fall and had a comminuted, displaced and impacted intratrochanteric fracture of the left hip.  He is status post intramedullary nailing of the left femur.  His hospital course was complicated by A-fib with RVR and associated hypotension requiring IV pressors as well as acute blood loss anemia requiring blood transfusion.   He was discharged to a subacute rehab on 11/04/21 and returned back to the emergency room on 11/10/21 for evaluation of a period of unresponsiveness.    He was discharged on 11/16/21 and returned to the ER within 24 hours after discharge for evaluation of left leg pain.  Admission was requested but patient symptoms improved and he was discharged back to the skilled nursing facility for subacute rehab.    He was seen again in the ER on 11/27/2021 and discharged back to the skilled nursing facility.  Patient was discharged home from the skilled nursing facility 4 days prior to this admission and his son who is at the bedside states that he did not do well in rehab due to persistent left leg pain.  He was placed on tramadol for pain control which initially helped his pain but no longer provides any analgesic effect.  He states that patient's oral intake has been very poor and he has nausea all the time.  Since his discharge home he has been very weak and unable to ambulate and physical therapy came out to  see him today but patient was unable to participate in therapy due to severe left leg pain prompting visit to the emergency room today.  He has not had any falls.  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      SLP Plan  All goals met      Recommendations for follow up therapy are one component of a multi-disciplinary discharge planning process, led by the attending physician.  Recommendations may be updated based on patient status, additional functional criteria and insurance authorization.    Recommendations  Diet recommendations: Dysphagia 3 (mechanical soft);Thin liquid Liquids provided via: Cup (rec'd) Medication Administration: Whole meds with puree (for safer swallowing) Supervision: Patient able to self feed;Intermittent supervision to cue for compensatory strategies (setup support; positioning) Compensations: Minimize environmental distractions;Slow rate;Small sips/bites;Lingual sweep for clearance of pocketing;Follow solids with liquid Postural Changes and/or Swallow Maneuvers: Out of bed for meals;Seated upright 90 degrees;Upright 30-60 min after meal (REFLUX precs)                General recommendations:  (Dietician f/u; Palliative Care f/u) Oral Care Recommendations: Oral care BID;Oral care before and after PO;Staff/trained caregiver to provide oral care (support) Follow Up Recommendations: No SLP follow up (at this  time) Assistance recommended at discharge: Set up Supervision/Assistance (for positioning and setup) SLP Visit Diagnosis: Dysphagia, unspecified (R13.10) (baseline dysphagia including Esophageal phase Dysmotiltiy, REFLUX) Plan: All goals met            Orinda Kenner, MS, CCC-SLP Speech Language Pathologist Rehab Services; [employer] [phone]  (ascom) Tyjai Matuszak  12/14/2021, 3:00 PM

## 2021-12-14 NOTE — Progress Notes (Signed)
[facility] Progress Note  Patient: Jeremy Herrera    MGQ:[id number]  DOA: 12/12/2021    Date of Service: the patient was seen and examined on 12/14/2021  Brief hospital course: 81 year old male past medical history of hypertension, diabetes mellitus, CAD, atrial fibrillation status post Watchman device and COPD who had suffered a fall on 9/5 causing a comminuted displaced and impacted intertrochanteric fracture of the left hip and patient underwent IM nail.  Patient was discharged to skilled nursing facility.  Continue to have persistent left leg pain and presented to the emergency department on 10/23 for the third time with this complaint.  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  Patient brought in for further evaluation.  Assessment and Plan: Assessment and Plan: Leg pain Seen by orthopedic surgery.  I had extensive discussion with the patient and it appears that Ultram was controlling his pain, but the issues that he has been experiencing are due to working with physical therapy prior to getting or allowing time for Ultram to start.    Attempted scheduled (which led to increased somnolence although pain was better controlled.  Palliative care also following and so trying as needed Ultram along with scheduled muscle relaxer. Orthopedic surgery feels that pain is to be expected as he had a significantly comminuted fracture.  Generalized weakness Patient is severely deconditioned following his left hip fracture repair. Physical therapy has been limited by pain in his left leg of an unclear etiology Place patient on fall precautions Consult orthopedic surgery PT eval once patient is stable  Dysphagia Patient seen by speech therapy.  He is found to have moderate oropharyngeal dysphagia consistent with neuromuscular  impairment as well as some decrease sensation of larynx.  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  Does not appear to have any acute findings and appears to be at his baseline.  Speech therapy recommending dysphagia 3 diet.  Initially thought to have aspiration pneumonia based off of x-ray, however procalcitonin level normal so have discontinued antibiotics.  Atrial fibrillation (HCC) Continue metoprolol for rate control Patient not on anticoagulation due to increased risk for falls  Orthostatic hypotension Continue midodrine   COPD (chronic obstructive pulmonary disease) (HCC) Not acutely exacerbated Continue as needed bronchodilator therapy and inhaled steroids  Depression Continue sertraline  CAD (coronary artery disease) Continue aspirin, low-dose beta-blocker and statins  GERD without esophagitis Place patient on PPI  Protein-calorie malnutrition, severe Nutrition Status: Nutrition Problem: Severe Malnutrition Etiology: chronic illness (COPD) Signs/Symptoms: percent weight loss, moderate fat depletion, severe fat depletion, moderate muscle depletion, severe muscle depletion Interventions: Ensure Enlive (each supplement provides 350kcal and 20 grams of protein), MVI, Prostat, Magic cup  Patient previously with moderate protein calorie malnutrition so this indicates worsening  Nutrition following       Body mass index is 21.83 kg/m.  Nutrition Problem: Severe Malnutrition Etiology: chronic illness (COPD)     Consultants: Palliative care Orthopedic surgery Nutrition Speech therapy  Procedures: None  Antimicrobials: IV Unasyn 10/23 - 10/24  Code Status: DNR   Subjective: Leg pain better although more sleepy  Objective: Vital signs were reviewed and unremarkable. Vitals:   12/14/21 0800 12/14/21 1557  BP: (!) 139/99 (!) 121/107  Pulse: 79 91  Resp: 18 18  Temp: 97.6 F (36.4 C) (!) 97.4 F (36.3 C)  SpO2: 99% 97%  Intake/Output  Summary (Last 24 hours) at 12/14/2021 1648 Last data filed at 12/14/2021 1028 Gross per 24 hour  Intake 240 ml  Output 550 ml  Net -310 ml    Filed Weights   12/12/21 1150 12/12/21 1201  Weight: 100 kg 77.1 kg   Body mass index is 21.83 kg/m.  Exam:  General: Alert and oriented x2, no acute distress HEENT: Normocephalic, atraumatic, poor dentition, mucous membranes are moist Cardiovascular: Irregular rhythm, rate controlled Respiratory: Clear to auscultation bilaterally Abdomen: Soft, nontender, nondistended, positive bowel sounds Musculoskeletal: No clubbing or cyanosis or edema. Skin: Various skin tags and basal cell carcinomas Psychiatry: Possibly some mild underlying dementia, but minimal, patient quite functional Neurology: No focal deficits  Data Reviewed: No labs today  Disposition:  Status is: Inpatient  Anticipated discharge date: 10/26, if skilled nursing bed available  Remaining issues to be resolved so that patient can be discharged:  -Possible SNF placement -Pain control   Family Communication: We will call son DVT Prophylaxis: enoxaparin (LOVENOX) injection 40 mg Start: 12/12/21 2200    Author: Hollice Espy ,MD 12/14/2021 4:48 PM  To reach On-call, see care teams to locate the attending and reach out via [email or web address]. Between 7PM-7AM, please contact night-coverage If you still have difficulty reaching the attending provider, please page the [facility] (Director on Call) for [facility] on amion for assistance.

## 2021-12-14 NOTE — Progress Notes (Signed)
Physical Therapy Treatment Patient Details Name: Jeremy Herrera MRN: [id number] DOB: Oct 24, 1940 Today's Date: 12/14/2021   History of Present Illness Jeremy Herrera is a 81 y.o. male with medical history significant for hypertension, diabetes mellitus, coronary artery disease, A-fib status post Watchman device insertion, COPD who presents to the ER for evaluation of severe left leg pain, weakness, malnutrition, and aspiration pneumonia . Patient has had several hospitalizations and ER visits since 10/25/21 when he presented to the ER after a fall and had a comminuted, displaced and impacted intratrochanteric fracture of the left hip.  He is status post intramedullary nailing of the left femur.  His hospital course was complicated by A-fib with RVR and associated hypotension requiring IV pressors as well as acute blood loss anemia requiring blood transfusion.   He was discharged to a subacute rehab on 11/04/21 and returned back to the emergency room on 11/10/21 for evaluation of a period of unresponsiveness.  He was discharged on 11/16/21 and returned to the ER within 24 hours after discharge for evaluation of left leg pain.  Admission was requested but patient symptoms improved and he was discharged back to the skilled nursing facility for subacute rehab.    He was seen again in the ER on 11/27/2021 and discharged back to the skilled nursing facility.  Patient was discharged home from the skilled nursing facility 4 days prior to this admission and his son who is at the bedside states that he did not do well in rehab due to persistent left leg pain.    PT Comments    Pt presents for f/u alert and oriented seated in bed asleep, but easily arouse and agreeable to PT. He continues to show decrease LE strength requiring mod A to prevent falling forward while standing. He did show improved stand step pivot transfer requiring less VC to progress feet. Session terminated by pt fatigue and his HR reaching 128  while attempting prolonged stand. He will continue to benefit from skilled PT in house to improve LE strength and mobility and PT continues to recommend d/c to SNF.    Recommendations for follow up therapy are one component of a multi-disciplinary discharge planning process, led by the attending physician.  Recommendations may be updated based on patient status, additional functional criteria and insurance authorization.  Follow Up Recommendations  Skilled nursing-short term rehab (<3 hours/day) Can patient physically be transported by private vehicle: No   Assistance Recommended at Discharge Frequent or constant Supervision/Assistance  Patient can return home with the following A lot of help with bathing/dressing/bathroom;A lot of help with walking and/or transfers;Assistance with cooking/housework;Assist for transportation   Equipment Recommendations  BSC/3in1    Recommendations for Other Services       Precautions / Restrictions Precautions Precautions: Fall Restrictions Weight Bearing Restrictions: No Other Position/Activity Restrictions: Pain with LLE     Mobility  Bed Mobility Overal bed mobility: Modified Independent Bed Mobility: Sit to Supine     Supine to sit: Modified independent (Device/Increase time)     General bed mobility comments: Needs increased time and help with setup    Transfers Overall transfer level: Needs assistance Equipment used: Rolling walker (2 wheels) Transfers: Sit to/from Stand, Bed to chair/wheelchair/BSC Sit to Stand: Mod assist   Step pivot transfers: Min assist       General transfer comment: Needs mod A during sit to stand to prevent lumbar flexion when he does not have BUE support on RW    Ambulation/Gait  Stairs             Wheelchair Mobility    Modified Rankin (Stroke Patients Only)       Balance Overall balance assessment: Needs assistance Sitting-balance support: Feet  unsupported, No upper extremity supported Sitting balance-Leahy Scale: Good     Standing balance support: Bilateral upper extremity supported, Reliant on assistive device for balance   Standing balance comment: Anterior lean when initiating sit to stand with uncontrolled trunk. Needs mod A from PT to maintain trunk stability                            Cognition Arousal/Alertness: Awake/alert Behavior During Therapy: WFL for tasks assessed/performed Overall Cognitive Status: Within Functional Limits for tasks assessed                                 General Comments: Lethargic likely because of pain meds        Exercises Other Exercises Other Exercises: Long arch quads 2 x 10 (LLE harder to perform) Other Exercises: Seated Marches 2 x 10    General Comments        Pertinent Vitals/Pain Pain Assessment Pain Assessment: 0-10 Pain Score: 2  Pain Location: Left hip Pain Descriptors / Indicators: Aching Pain Intervention(s): Monitored during session, Premedicated before session    Home Living                          Prior Function            PT Goals (current goals can now be found in the care plan section) Acute Rehab PT Goals Patient Stated Goal: To return home safely PT Goal Formulation: With patient Time For Goal Achievement: 12/27/21 Potential to Achieve Goals: Fair Progress towards PT goals: Progressing toward goals    Frequency    Min 2X/week      PT Plan Current plan remains appropriate    Co-evaluation              AM-PAC PT "6 Clicks" Mobility   Outcome Measure  Help needed turning from your back to your side while in a flat bed without using bedrails?: A Little Help needed moving from lying on your back to sitting on the side of a flat bed without using bedrails?: A Little Help needed moving to and from a bed to a chair (including a wheelchair)?: A Lot Help needed standing up from a chair using your  arms (e.g., wheelchair or bedside chair)?: A Lot Help needed to walk in hospital room?: A Lot Help needed climbing 3-5 steps with a railing? : Total 6 Click Score: 13    End of Session Equipment Utilized During Treatment: Gait belt Activity Tolerance: Patient tolerated treatment well;Other (comment) (Patient limited by increased HR and fatigue) Patient left: in chair;with call bell/phone within reach;with chair alarm set Nurse Communication: Mobility status PT Visit Diagnosis: Unsteadiness on feet (R26.81);Other abnormalities of gait and mobility (R26.89);Repeated falls (R29.6);Muscle weakness (generalized) (M62.81);History of falling (Z91.81)     Time: 1610-9604 PT Time Calculation (min) (ACUTE ONLY): 25 min  Charges:  $Therapeutic Activity: 23-37 mins                     Ellin Goodie PT, DPT   12/14/2021, 3:37 PM

## 2021-12-15 DIAGNOSIS — R531 Weakness: Secondary | ICD-10-CM | POA: Diagnosis not present

## 2021-12-15 DIAGNOSIS — E43 Unspecified severe protein-calorie malnutrition: Secondary | ICD-10-CM | POA: Diagnosis not present

## 2021-12-15 DIAGNOSIS — M79605 Pain in left leg: Secondary | ICD-10-CM | POA: Diagnosis not present

## 2021-12-15 DIAGNOSIS — R1314 Dysphagia, pharyngoesophageal phase: Secondary | ICD-10-CM | POA: Diagnosis not present

## 2021-12-15 DIAGNOSIS — J449 Chronic obstructive pulmonary disease, unspecified: Secondary | ICD-10-CM | POA: Diagnosis not present

## 2021-12-15 DIAGNOSIS — I4811 Longstanding persistent atrial fibrillation: Secondary | ICD-10-CM | POA: Diagnosis not present

## 2021-12-15 LAB — URINALYSIS, ROUTINE W REFLEX MICROSCOPIC
Bilirubin Urine: NEGATIVE
Glucose, UA: NEGATIVE mg/dL
Hgb urine dipstick: NEGATIVE
Ketones, ur: NEGATIVE mg/dL
Leukocytes,Ua: NEGATIVE
Nitrite: NEGATIVE
Protein, ur: NEGATIVE mg/dL
Specific Gravity, Urine: 1.026 (ref 1.005–1.030)
pH: 6 (ref 5.0–8.0)

## 2021-12-15 NOTE — Care Management Important Message (Signed)
Important Message  Patient Details  Name: Jeremy Herrera MRN: [id number] Date of Birth: November 17, 1940   Medicare Important Message Given:  Yes     Dannette Barbara 12/15/2021, 3:44 PM

## 2021-12-15 NOTE — Plan of Care (Signed)

## 2021-12-15 NOTE — Progress Notes (Signed)
Physical Therapy Treatment Patient Details Name: Jeremy Herrera MRN: [id number] DOB: [birth year] Today's Date: 12/15/2021   History of Present Illness Jeremy Herrera is a 81 y.o. male with medical history significant for hypertension, diabetes mellitus, coronary artery disease, A-fib status post Watchman device insertion, COPD who presents to the ER for evaluation of severe left leg pain, weakness, malnutrition, and aspiration pneumonia . Patient has had several hospitalizations and ER visits since 10/25/21 when he presented to the ER after a fall and had a comminuted, displaced and impacted intratrochanteric fracture of the left hip.  He is status post intramedullary nailing of the left femur.  His hospital course was complicated by A-fib with RVR and associated hypotension requiring IV pressors as well as acute blood loss anemia requiring blood transfusion.   He was discharged to a subacute rehab on 11/04/21 and returned back to the emergency room on 11/10/21 for evaluation of a period of unresponsiveness.  He was discharged on 11/16/21 and returned to the ER within 24 hours after discharge for evaluation of left leg pain.  Admission was requested but patient symptoms improved and he was discharged back to the skilled nursing facility for subacute rehab.    He was seen again in the ER on 11/27/2021 and discharged back to the skilled nursing facility.  Patient was discharged home from the skilled nursing facility 4 days prior to this admission and his son who is at the bedside states that he did not do well in rehab due to persistent left leg pain.    PT Comments    Patient received in bed, reports he is sleepy today, but is agreeable to PT session. Reports pain at rest at 1/10. Patient is mod I with bed mobility. Transfers with mod A from slightly elevated bed. Cues for hand placement and safety. Patient is able to take a few side steps along edge of bed without much difficulty and min guard.  Performed another sit to stand after seated rest. HR up to 150 with minimal activity. Reports pain increased to 4/10 with weight bearing. He will continue to benefit from skilled PT while here to improve functional independence and strength.      Recommendations for follow up therapy are one component of a multi-disciplinary discharge planning process, led by the attending physician.  Recommendations may be updated based on patient status, additional functional criteria and insurance authorization.  Follow Up Recommendations  Skilled nursing-short term rehab (<3 hours/day) Can patient physically be transported by private vehicle: No   Assistance Recommended at Discharge Frequent or constant Supervision/Assistance  Patient can return home with the following A lot of help with bathing/dressing/bathroom;A lot of help with walking and/or transfers;Assistance with cooking/housework;Assist for transportation   Equipment Recommendations  Other (comment) (TBD)    Recommendations for Other Services       Precautions / Restrictions Precautions Precautions: Fall Precaution Comments: HR increased to 150 with standing Restrictions Weight Bearing Restrictions: No Other Position/Activity Restrictions: Pain in LLE due to hip fracture in sept.     Mobility  Bed Mobility Overal bed mobility: Modified Independent Bed Mobility: Supine to Sit, Sit to Supine     Supine to sit: Modified independent (Device/Increase time) Sit to supine: Modified independent (Device/Increase time)   General bed mobility comments: Needs increased time and help with setup    Transfers Overall transfer level: Needs assistance Equipment used: Rolling walker (2 wheels) Transfers: Sit to/from Stand Sit to Stand: Mod assist, From elevated surface  Ambulation/Gait Ambulation/Gait assistance: Min assist Gait Distance (Feet): 2 Feet Assistive device: Rolling walker (2 wheels) Gait  Pattern/deviations: Step-to pattern Gait velocity: decr     General Gait Details: able to side step along edge of bed   Stairs             Wheelchair Mobility    Modified Rankin (Stroke Patients Only)       Balance Overall balance assessment: Needs assistance Sitting-balance support: Feet supported Sitting balance-Leahy Scale: Good     Standing balance support: Bilateral upper extremity supported, During functional activity, Reliant on assistive device for balance Standing balance-Leahy Scale: Fair                              Cognition Arousal/Alertness: Awake/alert Behavior During Therapy: WFL for tasks assessed/performed Overall Cognitive Status: Within Functional Limits for tasks assessed                                          Exercises      General Comments        Pertinent Vitals/Pain Pain Assessment Pain Assessment: 0-10 Pain Score: 4  Breathing: occasional labored breathing, short period of hyperventilation Negative Vocalization: none Facial Expression: smiling or inexpressive Body Language: tense, distressed pacing, fidgeting Consolability: no need to console PAINAD Score: 2 Pain Descriptors / Indicators: Discomfort Pain Intervention(s): Monitored during session, Repositioned    Home Living                          Prior Function            PT Goals (current goals can now be found in the care plan section) Acute Rehab PT Goals Patient Stated Goal: improve mobility PT Goal Formulation: With patient Time For Goal Achievement: 12/27/21 Potential to Achieve Goals: Fair Progress towards PT goals: Progressing toward goals    Frequency    Min 2X/week      PT Plan Current plan remains appropriate    Co-evaluation              AM-PAC PT "6 Clicks" Mobility   Outcome Measure  Help needed turning from your back to your side while in a flat bed without using bedrails?: A Little Help  needed moving from lying on your back to sitting on the side of a flat bed without using bedrails?: A Little Help needed moving to and from a bed to a chair (including a wheelchair)?: A Little Help needed standing up from a chair using your arms (e.g., wheelchair or bedside chair)?: A Lot Help needed to walk in hospital room?: A Lot Help needed climbing 3-5 steps with a railing? : Total 6 Click Score: 14    End of Session Equipment Utilized During Treatment: Gait belt Activity Tolerance: Patient limited by pain;Patient limited by fatigue;Other (comment) (increased HR with activity) Patient left: in bed;with call bell/phone within reach;with bed alarm set Nurse Communication: Mobility status PT Visit Diagnosis: Muscle weakness (generalized) (M62.81);Difficulty in walking, not elsewhere classified (R26.2);Unsteadiness on feet (R26.81);Other abnormalities of gait and mobility (R26.89)     Time: 9371-6967 PT Time Calculation (min) (ACUTE ONLY): 31 min  Charges:  $Therapeutic Activity: 23-37 mins  Xayvier Vallez, PT, GCS 12/15/21,12:30 PM

## 2021-12-15 NOTE — Progress Notes (Signed)
[facility] Progress Note  Patient: Jeremy Herrera    GQQ:[id number]  DOA: 12/12/2021    Date of Service: the patient was seen and examined on 12/15/2021  Brief hospital course: 81 year old male past medical history of hypertension, diabetes mellitus, CAD, atrial fibrillation status post Watchman device and COPD who had suffered a fall on 9/5 causing a comminuted displaced and impacted intertrochanteric fracture of the left hip and patient underwent IM nail.  Patient was discharged to skilled nursing facility.  Continue to have persistent left leg pain and presented to the emergency department on 10/23 for the third time with this complaint.  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  Patient brought in for further evaluation.  Assessment and Plan: Assessment and Plan: Leg pain Seen by orthopedic surgery.  I had extensive discussion with the patient and it appears that Ultram was controlling his pain, but the issues that he has been experiencing are due to working with physical therapy prior to getting or allowing time for Ultram to start.  Schedule Ultram causing increased somnolence.  Patient seems to be responding well to scheduled Robaxin with as needed Ultram.  Orthopedic surgery feels that pain is to be expected as he had a significantly comminuted fracture.  Appreciate palliative care help.  Generalized weakness Patient is severely deconditioned following his left hip fracture repair. Physical therapy has been limited by pain in his left leg of an unclear etiology Place patient on fall precautions Consult orthopedic surgery PT eval once patient is stable  Dysphagia Patient seen by speech therapy.  He is found to have moderate oropharyngeal dysphagia consistent with neuromuscular impairment as well as some decrease  sensation of larynx.  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  Does not appear to have any acute findings and appears to be at his baseline.  Speech therapy recommending dysphagia 3 diet.  Initially thought to have aspiration pneumonia based off of x-ray, however procalcitonin level normal so have discontinued antibiotics.  Atrial fibrillation (HCC) Continue metoprolol for rate control Patient not on anticoagulation due to increased risk for falls  Orthostatic hypotension Continue midodrine   COPD (chronic obstructive pulmonary disease) (HCC) Not acutely exacerbated Continue as needed bronchodilator therapy and inhaled steroids  Depression Continue sertraline  CAD (coronary artery disease) Continue aspirin, low-dose beta-blocker and statins  GERD without esophagitis Place patient on PPI  Protein-calorie malnutrition, severe Nutrition Status: Nutrition Problem: Severe Malnutrition Etiology: chronic illness (COPD) Signs/Symptoms: percent weight loss, moderate fat depletion, severe fat depletion, moderate muscle depletion, severe muscle depletion Interventions: Ensure Enlive (each supplement provides 350kcal and 20 grams of protein), MVI, Prostat, Magic cup  Patient previously with moderate protein calorie malnutrition so this indicates worsening  Nutrition following       Body mass index is 21.83 kg/m.  Nutrition Problem: Severe Malnutrition Etiology: chronic illness (COPD)     Consultants: Palliative care Orthopedic surgery Nutrition Speech therapy  Procedures: None  Antimicrobials: IV Unasyn 10/23 - 10/24  Code Status: DNR   Subjective: Less leg pain  Objective: Vital signs were reviewed and unremarkable. Vitals:   12/15/21 0816 12/15/21 1225  BP: 120/77 (!) 145/88  Pulse: 95 94  Resp: 18   Temp: (!) 97.4 F (36.3 C)   SpO2: 97%     Intake/Output Summary (Last 24 hours) at 12/15/2021 1537 Last data  filed at 12/15/2021 1418 Gross  per 24 hour  Intake 240 ml  Output 200 ml  Net 40 ml    Filed Weights   12/12/21 1150 12/12/21 1201  Weight: 100 kg 77.1 kg   Body mass index is 21.83 kg/m.  Exam:  General: Alert and oriented x2, no acute distress HEENT: Normocephalic, atraumatic, poor dentition, mucous membranes are moist Cardiovascular: Irregular rhythm, rate controlled Respiratory: Clear to auscultation bilaterally Abdomen: Soft, nontender, nondistended, positive bowel sounds Musculoskeletal: No clubbing or cyanosis or edema. Skin: Various skin tags and basal cell carcinomas Psychiatry: Possibly some mild underlying dementia, but minimal, patient quite functional Neurology: No focal deficits  Data Reviewed: Urinalysis unremarkable  Disposition:  Status is: Inpatient  Anticipated discharge date: 10/27, if skilled nursing bed approved  Remaining issues to be resolved so that patient can be discharged:  -Authorization for SNF placement    Family Communication: We will call son DVT Prophylaxis: enoxaparin (LOVENOX) injection 40 mg Start: 12/12/21 2200    Author: Hollice Espy ,MD 12/15/2021 3:37 PM  To reach On-call, see care teams to locate the attending and reach out via [email or web address]. Between 7PM-7AM, please contact night-coverage If you still have difficulty reaching the attending provider, please page the [facility] (Director on Call) for [facility] on amion for assistance.

## 2021-12-15 NOTE — TOC Progression Note (Signed)
Transition of Care [facility]) - Progression Note    Patient Details  Name: Jeremy Herrera MRN: [id number] Date of Birth: 16-Nov-1940  [facility]) CM/SW Contact  Beverly Sessions, RN Phone Number: 12/15/2021, 1:37 PM  Clinical Narrative:      Bed offers presented to daughter She accepts bed at [facility].  Accepted in [place] and notified Kieth Brightly at [facility]  Per MD patient medically ready to start auth for SNF Navi portal is currently down.  I have reached out rep, they are having issues on their end as well and state I need to keep trying the portal   Kieth Brightly at [facility] notified       Expected Discharge Plan and Services                                                 Social Determinants of Health (SDOH) Interventions    Readmission Risk Interventions     No data to display

## 2021-12-15 NOTE — TOC Progression Note (Signed)
Transition of Care [facility]) - Progression Note    Patient Details  Name: ARATH KAIGLER MRN: [id number] Date of Birth: Feb 13, 1941  Transition of Care [facility]) CM/SW Contact  Beverly Sessions, RN Phone Number: 12/15/2021, 2:43 PM  Clinical Narrative:     Josem Kaufmann started in navi portal        Expected Discharge Plan and Services                                                 Social Determinants of Health (SDOH) Interventions    Readmission Risk Interventions     No data to display

## 2021-12-15 NOTE — Progress Notes (Addendum)
                                                     Palliative Care Progress Note, Assessment & Plan   Patient Name: Jeremy Herrera       Date: 12/15/2021 DOB: Sep 24, 1940  Age: 81 y.o. MRN#: [id number] Attending Physician: Annita Brod, MD Primary Care Physician: Sofie Hartigan, MD Admit Date: 12/12/2021  Reason for Consultation/Follow-up: Pain management  Subjective: Patient is lying in bed in no apparent distress.  Respirations are even and unlabored.  He is easily arousable but falls quickly back to sleep.  No family at bedside.  Summary of counseling/coordination of care: After reviewing the patient's chart and assessing the patient at bedside, patient's pain appears to be adequately controlled with as needed meds have not been used excessively.  Scheduled Robaxin appears to have minimized patient's experience of pain at this time.  I recommended continue with Robaxin as scheduled every 8 hours with Ultram 25 mg to 50 mg as needed.  Goals remain clear.    Plan is for SNF.  TOC closely following.  DNR remains. MOST form decisions remain patient's wishes and are up to date.   PMT will shadow the patient's chart and monitor peripherally.  Please reengage if goals change, at patient/family's request, or if patient's health deteriorates during hospitalization.  Physical Exam Vitals reviewed.  HENT:     Head: Normocephalic.     Mouth/Throat:     Mouth: Mucous membranes are moist.  Eyes:     Pupils: Pupils are equal, round, and reactive to light.  Cardiovascular:     Rate and Rhythm: Normal rate.     Pulses: Normal pulses.  Pulmonary:     Effort: Pulmonary effort is normal.  Abdominal:     Palpations: Abdomen is soft.  Musculoskeletal:     Comments: MAETC, generalized weakness             Palliative Assessment/Data:  50%    Total Time 25 minutes  Greater than 50%  of this time was spent counseling and coordinating care related to the above assessment and plan.  Thank you for allowing the Palliative Medicine Team to assist in the care of this patient.  [place] Ilsa Iha, FNP-BC Palliative Medicine Team Team Phone # [phone]

## 2021-12-16 DIAGNOSIS — W19XXXA Unspecified fall, initial encounter: Secondary | ICD-10-CM | POA: Diagnosis not present

## 2021-12-16 DIAGNOSIS — R0602 Shortness of breath: Secondary | ICD-10-CM | POA: Diagnosis not present

## 2021-12-16 DIAGNOSIS — Z9181 History of falling: Secondary | ICD-10-CM | POA: Diagnosis not present

## 2021-12-16 DIAGNOSIS — R6889 Other general symptoms and signs: Secondary | ICD-10-CM | POA: Diagnosis not present

## 2021-12-16 DIAGNOSIS — I10 Essential (primary) hypertension: Secondary | ICD-10-CM | POA: Diagnosis not present

## 2021-12-16 DIAGNOSIS — G8929 Other chronic pain: Secondary | ICD-10-CM | POA: Diagnosis not present

## 2021-12-16 DIAGNOSIS — I4891 Unspecified atrial fibrillation: Secondary | ICD-10-CM | POA: Diagnosis not present

## 2021-12-16 DIAGNOSIS — I4811 Longstanding persistent atrial fibrillation: Secondary | ICD-10-CM | POA: Diagnosis not present

## 2021-12-16 DIAGNOSIS — E119 Type 2 diabetes mellitus without complications: Secondary | ICD-10-CM | POA: Diagnosis not present

## 2021-12-16 DIAGNOSIS — R531 Weakness: Secondary | ICD-10-CM | POA: Diagnosis not present

## 2021-12-16 DIAGNOSIS — M79605 Pain in left leg: Secondary | ICD-10-CM | POA: Diagnosis not present

## 2021-12-16 DIAGNOSIS — R42 Dizziness and giddiness: Secondary | ICD-10-CM | POA: Diagnosis not present

## 2021-12-16 DIAGNOSIS — R1084 Generalized abdominal pain: Secondary | ICD-10-CM | POA: Diagnosis not present

## 2021-12-16 DIAGNOSIS — R52 Pain, unspecified: Secondary | ICD-10-CM | POA: Diagnosis not present

## 2021-12-16 DIAGNOSIS — Z7401 Bed confinement status: Secondary | ICD-10-CM | POA: Diagnosis not present

## 2021-12-16 DIAGNOSIS — R630 Anorexia: Secondary | ICD-10-CM | POA: Diagnosis not present

## 2021-12-16 DIAGNOSIS — M25552 Pain in left hip: Secondary | ICD-10-CM | POA: Diagnosis not present

## 2021-12-16 DIAGNOSIS — I5043 Acute on chronic combined systolic (congestive) and diastolic (congestive) heart failure: Secondary | ICD-10-CM | POA: Diagnosis not present

## 2021-12-16 DIAGNOSIS — S72142A Displaced intertrochanteric fracture of left femur, initial encounter for closed fracture: Secondary | ICD-10-CM | POA: Diagnosis not present

## 2021-12-16 DIAGNOSIS — R635 Abnormal weight gain: Secondary | ICD-10-CM | POA: Diagnosis not present

## 2021-12-16 DIAGNOSIS — E559 Vitamin D deficiency, unspecified: Secondary | ICD-10-CM | POA: Diagnosis not present

## 2021-12-16 DIAGNOSIS — S72002A Fracture of unspecified part of neck of left femur, initial encounter for closed fracture: Secondary | ICD-10-CM | POA: Diagnosis not present

## 2021-12-16 DIAGNOSIS — J449 Chronic obstructive pulmonary disease, unspecified: Secondary | ICD-10-CM | POA: Diagnosis not present

## 2021-12-16 DIAGNOSIS — S72142D Displaced intertrochanteric fracture of left femur, subsequent encounter for closed fracture with routine healing: Secondary | ICD-10-CM | POA: Diagnosis not present

## 2021-12-16 DIAGNOSIS — D649 Anemia, unspecified: Secondary | ICD-10-CM | POA: Diagnosis not present

## 2021-12-16 DIAGNOSIS — R1314 Dysphagia, pharyngoesophageal phase: Secondary | ICD-10-CM | POA: Diagnosis not present

## 2021-12-16 DIAGNOSIS — E43 Unspecified severe protein-calorie malnutrition: Secondary | ICD-10-CM | POA: Diagnosis not present

## 2021-12-16 DIAGNOSIS — M25562 Pain in left knee: Secondary | ICD-10-CM | POA: Diagnosis not present

## 2021-12-16 MED ORDER — ENSURE ENLIVE PO LIQD: 237.0000 mL | Freq: Three times a day (TID) | ORAL | 12 refills | Status: DC

## 2021-12-16 MED ORDER — ADULT MULTIVITAMIN W/MINERALS CH: 1.0000 | ORAL_TABLET | Freq: Every day | ORAL | 1 refills | Status: DC

## 2021-12-16 MED ORDER — METHOCARBAMOL 500 MG PO TABS: 500.0000 mg | ORAL_TABLET | Freq: Three times a day (TID) | ORAL | 2 refills | Status: DC

## 2021-12-16 MED ORDER — TRAMADOL HCL 50 MG PO TABS: 50.0000 mg | ORAL_TABLET | Freq: Four times a day (QID) | ORAL | 0 refills | Status: DC | PRN

## 2021-12-16 MED ORDER — METOPROLOL SUCCINATE ER 25 MG PO TB24: 37.5000 mg | ORAL_TABLET | Freq: Every day | ORAL | Status: DC

## 2021-12-16 NOTE — Plan of Care (Signed)

## 2021-12-16 NOTE — Progress Notes (Signed)
Pt was informed about DC orders. IV was taken out. Morning bath and schedule meds were given. Pt denies pain at this time.

## 2021-12-16 NOTE — TOC Transition Note (Signed)
Transition of Care [facility]) - CM/SW Discharge Note   Patient Details  Name: Jeremy Herrera MRN: [id number] Date of Birth: 08-03-1940  Transition of Care [facility]) CM/SW Contact:  Beverly Sessions, RN Phone Number: 12/16/2021, 11:40 AM   Clinical Narrative:      Josem Kaufmann obtained  Patient will DC to: [facility] Anticipated DC date: 12/16/21  Family notified:Daughter Transport [id number]  Per MD patient ready for DC to . RN, patient's family, and facility notified of DC. Discharge Summary sent to facility. RN given number for report. DC packet on chart. Signed scripts and signed DNR on chart. Ambulance transport requested for patient.  TOC signing off.  Isaias Cowman [facility] [phone]      Patient Goals and CMS Choice        Discharge Placement                       Discharge Plan and Services                                     Social Determinants of Health (SDOH) Interventions     Readmission Risk Interventions     No data to display

## 2021-12-16 NOTE — Progress Notes (Signed)
Report give to [facility] . Questions were encourage and answered.

## 2021-12-16 NOTE — Discharge Summary (Signed)
Physician Discharge Summary   Patient: Jeremy Herrera MRN: [id number] DOB: [birth year]  Admit date:     12/12/2021  Discharge date: 12/16/21  Discharge Physician: Annita Brod   PCP: Sofie Hartigan, MD   Recommendations at discharge:   Medication clarification: Digoxin discontinued Medication clarification: Lovenox discontinued Medication change: Ensure increased form BID to TID New Medication: Robaxin 500 mg po TID scheduled Medication clarification: Toprol-XL 37.5 mg po daily (pt already had been on this dose) New medication: Multivitamin daily New Medication: Ultram 50 mg po every 6 hours as needed for moderate pain Patient discharged to SNF  Discharge Diagnoses: Active Problems:   Leg pain   Dysphagia   Atrial fibrillation (HCC)   Orthostatic hypotension   COPD (chronic obstructive pulmonary disease) (HCC)   Depression   CAD (coronary artery disease)   BPH with obstruction/lower urinary tract symptoms   GERD without esophagitis   Protein-calorie malnutrition, severe  Resolved Problems:   * No resolved hospital problems. *  Hospital Course: 81 year old male past medical history of hypertension, diabetes mellitus, CAD, atrial fibrillation status post Watchman device and COPD who had suffered a fall on 9/5 causing a comminuted displaced and impacted intertrochanteric fracture of the left hip and patient underwent IM nail.  Patient was discharged to skilled nursing facility.  Continue to have persistent left leg pain and presented to the emergency department on 10/23 for the third time with this complaint.  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  Patient brought in for further evaluation.  Assessment and Plan: Leg pain Seen by orthopedic surgery.  I had extensive discussion with the patient and it appears  that Ultram was controlling his pain, but the issues that he has been experiencing are due to working with physical therapy prior to getting or allowing time for Ultram to start.  Schedule Ultram causing increased somnolence.  Patient seems to be responding well to scheduled Robaxin with as needed Ultram.  Orthopedic surgery feels that pain is to be expected as he had a significantly comminuted fracture.  Appreciate palliative care help.  Generalized weakness Patient is severely deconditioned following his left hip fracture repair. Physical therapy has been limited by pain in his left leg of an unclear etiology Place patient on fall precautions Consult orthopedic surgery PT eval once patient is stable  Dysphagia Patient seen by speech therapy.  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  Does not appear to have any acute findings and appears to be at his baseline.  Speech therapy recommending dysphagia 3 diet.  Initially thought to have aspiration pneumonia based off of x-ray, however procalcitonin level normal so have discontinued antibiotics.  Atrial fibrillation (HCC) Continue metoprolol for rate control Patient not on anticoagulation due to increased risk for falls  Orthostatic hypotension Continue midodrine   COPD (chronic obstructive pulmonary disease) (HCC) Not acutely exacerbated Continue as needed bronchodilator therapy and inhaled steroids  Depression Continue sertraline  CAD (coronary artery disease) Continue aspirin, low-dose beta-blocker and statins  GERD without esophagitis Place patient on PPI  Protein-calorie malnutrition, severe Nutrition Status: Nutrition Problem: Severe Malnutrition Etiology: chronic illness (COPD) Signs/Symptoms: percent weight loss, moderate fat depletion, severe fat depletion, moderate muscle  depletion, severe muscle  depletion Interventions: Ensure Enlive (each supplement provides 350kcal and 20 grams of protein), MVI, Prostat, Magic cup  Patient previously with moderate protein calorie malnutrition so this indicates worsening  Nutrition following        Pain control - [place] Controlled Substance Reporting System database was reviewed. and patient was instructed, not to drive, operate heavy machinery, perform activities at heights, swimming or participation in water activities or provide baby-sitting services while on Pain, Sleep and Anxiety Medications; until their outpatient Physician has advised to do so again. Also recommended to not to take more than prescribed Pain, Sleep and Anxiety Medications.  Consultants:  -Palliative Care -Orthopedic Surgery  Procedures performed: None  Disposition: Skilled nursing facility Diet recommendation: Dysphagia 3 diet with Ensure TID between meals  DISCHARGE MEDICATION: Allergies as of 12/16/2021       Reactions   Hydrocodone-acetaminophen Nausea And Vomiting   Hydrocodone-acetaminophen Nausea And Vomiting   NOT true allergy        Medication List     STOP taking these medications    digoxin 0.25 MG tablet Commonly known as: LANOXIN   enoxaparin 40 MG/0.4ML injection Commonly known as: LOVENOX   ondansetron 4 MG tablet Commonly known as: ZOFRAN       TAKE these medications    albuterol 108 (90 Base) MCG/ACT inhaler Commonly known as: VENTOLIN HFA Inhale 1-2 puffs into the lungs every 4 (four) hours as needed.   aspirin 81 MG tablet Take 81 mg by mouth daily.   bisacodyl 10 MG suppository Commonly known as: DULCOLAX Place 1 suppository (10 mg total) rectally daily as needed for moderate constipation.   cyanocobalamin 1000 MCG tablet Commonly known as: VITAMIN B12 Take 1 tablet (1,000 mcg total) by mouth daily.   docusate sodium 100 MG capsule Commonly known as: COLACE Take 1 capsule (100 mg  total) by mouth 2 (two) times daily as needed for mild constipation.   feeding supplement Liqd Take 237 mLs by mouth 3 (three) times daily between meals. What changed: when to take this   ferrous sulfate 325 (65 FE) MG tablet Take 325 mg by mouth daily.   Fluticasone-Salmeterol 250-50 MCG/DOSE Aepb Commonly known as: ADVAIR Inhale 1 puff into the lungs in the morning and at bedtime.   lidocaine 5 % Commonly known as: Lidoderm Place 1 patch onto the skin every 12 (twelve) hours. Remove & Discard patch within 12 hours or as directed by MD   methocarbamol 500 MG tablet Commonly known as: ROBAXIN Take 1 tablet (500 mg total) by mouth every 8 (eight) hours. What changed:  when to take this reasons to take this   metoprolol succinate 25 MG 24 hr tablet Commonly known as: TOPROL-XL Take 1.5 tablets (37.5 mg total) by mouth daily.   midodrine 10 MG tablet Commonly known as: PROAMATINE Take 1 tablet (10 mg total) by mouth 3 (three) times daily with meals.   multivitamin with minerals Tabs tablet Take 1 tablet by mouth daily. Start taking on: December 17, 2021   pantoprazole 40 MG tablet Commonly known as: PROTONIX Take 1 tablet (40 mg total) by mouth 2 (two) times daily before a meal.   Remedy Phytoplex Hydraguard Crea Apply topically.   rosuvastatin 10 MG tablet Commonly known as: CRESTOR Take 1 tablet by mouth daily.   senna 8.6 MG Tabs tablet Commonly known as: SENOKOT Take 1 tablet (8.6 mg total) by mouth daily.   sertraline 25 MG tablet Commonly known as: ZOLOFT Take 25 mg  by mouth daily.   simethicone 125 MG chewable tablet Commonly known as: MYLICON Chew 0000000 mg by mouth every 6 (six) hours as needed.   traMADol 50 MG tablet Commonly known as: ULTRAM Take 1 tablet (50 mg total) by mouth every 6 (six) hours as needed for moderate pain.   umeclidinium-vilanterol 62.5-25 MCG/INH Aepb Commonly known as: ANORO ELLIPTA Inhale 1 puff into the lungs daily as  needed. Takes daily        Contact information for after-discharge care     Destination     [facility] .   Service: Skilled Nursing Contact information: [place] [phone]                    Discharge Exam: Danley Danker Weights   12/12/21 1150 12/12/21 1201  Weight: 100 kg 77.1 kg   General: alert & oriented x 2, no acute distress Cardiovascular: Irregular rhythm, rate controlled  Condition at discharge: good  The results of significant diagnostics from this hospitalization (including imaging, microbiology, ancillary and laboratory) are listed below for reference.   Imaging Studies: US ARTERIAL ABI (SCREENING LOWER EXTREMITY)  Result Date: 12/13/2021 CLINICAL DATA:  Left leg pain. EXAM: NONINVASIVE PHYSIOLOGIC VASCULAR STUDY OF BILATERAL LOWER EXTREMITIES TECHNIQUE: Evaluation of both lower extremities were performed at rest, including calculation of ankle-brachial indices with single level Doppler, pressure and pulse volume recording. COMPARISON:  None Available. FINDINGS: Right ABI:  1.10 Left ABI:  1.10 Right Lower Extremity: Irregular waveforms at the right ankle. Some waveforms may be triphasic. Left Lower Extremity: Irregular waveforms at the left ankle but some of the waveforms may be triphasic or biphasic. 1.0-1.4 Normal IMPRESSION: Normal ankle-brachial indices bilaterally. Electronically Signed   By: Markus Daft M.D.   On: 12/13/2021 12:49   DG FEMUR MIN 2 VIEWS LEFT  Result Date: 12/13/2021 CLINICAL DATA:  Left hip pain since intramedullary nail surgery on 10/25/2021, no known injury following surgery EXAM: PELVIS - 1-2 VIEW; LEFT FEMUR 2 VIEWS COMPARISON:  10/25/2021 FINDINGS: Osteopenia. Status post right hip total arthroplasty. Status post intramedullary nail fixation of intratrochanteric fractures of the left femur, with expected subacute appearance of fractures and near anatomic alignment.  No evidence of perihardware fracture or component loosening. Nonobstructive pattern of overlying bowel gas. IMPRESSION: Status post intramedullary nail fixation of intratrochanteric fractures of the left femur, with expected subacute appearance of fractures and near anatomic alignment. No evidence of perihardware fracture or component loosening. Electronically Signed   By: Delanna Ahmadi M.D.   On: 12/13/2021 09:18   DG Pelvis 1-2 Views  Result Date: 12/13/2021 CLINICAL DATA:  Left hip pain since intramedullary nail surgery on 10/25/2021, no known injury following surgery EXAM: PELVIS - 1-2 VIEW; LEFT FEMUR 2 VIEWS COMPARISON:  10/25/2021 FINDINGS: Osteopenia. Status post right hip total arthroplasty. Status post intramedullary nail fixation of intratrochanteric fractures of the left femur, with expected subacute appearance of fractures and near anatomic alignment. No evidence of perihardware fracture or component loosening. Nonobstructive pattern of overlying bowel gas. IMPRESSION: Status post intramedullary nail fixation of intratrochanteric fractures of the left femur, with expected subacute appearance of fractures and near anatomic alignment. No evidence of perihardware fracture or component loosening. Electronically Signed   By: Delanna Ahmadi M.D.   On: 12/13/2021 09:18   CT CHEST WO CONTRAST  Result Date: 12/12/2021 CLINICAL DATA:  Pneumonia, complication suspected, xray done EXAM: CT CHEST WITHOUT CONTRAST TECHNIQUE: Multidetector CT imaging of the  chest was performed following the standard protocol without IV contrast. RADIATION DOSE REDUCTION: This exam was performed according to the departmental dose-optimization program which includes automated exposure control, adjustment of the mA and/or kV according to patient size and/or use of iterative reconstruction technique. COMPARISON:  Radiograph 12/12/2021, chest CT 10/25/2021 FINDINGS: Cardiovascular: Mild cardiomegaly. Coronary artery  atherosclerosis. Left atrial appendage occlusion device aortic valve calcifications. Mild atherosclerosis of the thoracic aorta. Normal size main and branch pulmonary arteries. Mediastinum/Nodes: There are few prominent subcarinal/paraesophageal lymph nodes, unchanged from prior exam, favored to be reactive.The thyroid is unremarkable. The trachea is unremarkable. Lungs/Pleura: Mild lower lung predominant bronchiectasis. There is airspace disease in the left lung base and centrilobular nodules. There are ground-glass opacities in the right middle and right lower lobes, in the region of prior airspace disease seen in September. Continued improvement in left upper lung airspace disease as well. Similar subpleural reticulation bilaterally. Unchanged 6 mm nodule in the left lower lobe (series 3, image 129). There is a new 5 mm pulmonary nodule in the superior segment of the left lower lobe (series 3, image 62). Upper Abdomen: No acute abnormality. Musculoskeletal: No acute osseous abnormality. No suspicious osseous lesion. IMPRESSION: New airspace disease in the left lung base with adjacent centrilobular nodularity consistent with pneumonia, potentially aspiration. Improving airspace disease in the right middle lobe, right lower lobe, and left upper lobe. Stable 6 mm left lower pulmonary nodule. New 5 mm pulmonary nodule in the superior segment of the left lower lobe, potentially related to the infectious/inflammatory process. Recommend follow-up chest CT in 6 months. Electronically Signed   By: Maurine Simmering M.D.   On: 12/12/2021 16:48   DG Chest 2 View  Result Date: 12/12/2021 CLINICAL DATA:  Weakness. Unable to stand. Recent discharge from rehab. EXAM: CHEST - 2 VIEW COMPARISON:  One view chest x-ray 11/17/2021 FINDINGS: Heart is enlarged. Atrial appendage device noted. Chronic interstitial coarsening is again noted. Ill-defined airspace opacities are noted in the posterior inferior lower lobes. Fused anterior  syndesmophytes again noted. IMPRESSION: Ill-defined airspace opacities in the posterior inferior lower lobes. This may represent atelectasis or infection. Electronically Signed   By: San Morelle M.D.   On: 12/12/2021 13:15   US Venous Img Lower Unilateral Left  Result Date: 11/26/2021 CLINICAL DATA:  Left lower leg and calf pain. Anticoagulation therapy EXAM: Left LOWER EXTREMITY VENOUS DOPPLER ULTRASOUND TECHNIQUE: Gray-scale sonography with compression, as well as color and duplex ultrasound, were performed to evaluate the deep venous system(s) from the level of the common femoral vein through the popliteal and proximal calf veins. COMPARISON:  11/17/2021 FINDINGS: VENOUS Normal compressibility of the common femoral, superficial femoral, and popliteal veins, as well as the visualized calf veins. Visualized portions of profunda femoral vein and great saphenous vein unremarkable. No filling defects to suggest DVT on grayscale or color Doppler imaging. Doppler waveforms show normal direction of venous flow, normal respiratory plasticity and response to augmentation. Limited views of the contralateral common femoral vein are unremarkable. OTHER None. Limitations: none IMPRESSION: No evidence of deep venous thrombosis in the visualized lower extremity veins. Electronically Signed   By: Lucienne Capers M.D.   On: 11/26/2021 23:10   DG Knee Complete 4 Views Left  Result Date: 11/26/2021 CLINICAL DATA:  Pain and lower leg. EXAM: LEFT KNEE - COMPLETE 4+ VIEW COMPARISON:  None Available. FINDINGS: No evidence of an acute fracture, dislocation, or joint effusion. A radiopaque intramedullary rod and distal fixation screw are seen within the  distal left femur. No evidence of arthropathy or other focal bone abnormality. Soft tissues are unremarkable. IMPRESSION: 1. No acute osseous abnormality. 2. Prior open reduction and internal fixation of the left femur. Electronically Signed   By: Virgina Norfolk M.D.    On: 11/26/2021 21:11   DG Tibia/Fibula Left  Result Date: 11/26/2021 CLINICAL DATA:  Lower leg pain. EXAM: LEFT TIBIA AND FIBULA - 2 VIEW COMPARISON:  None Available. FINDINGS: There is no evidence of an acute fracture or other focal bone lesions within the left tibia or left fibula. A radiopaque intramedullary rod and distal fixation screw is seen within the visualized portion of the distal left femur. Soft tissues are unremarkable. IMPRESSION: 1. No acute osseous abnormality. 2. Postoperative changes involving the distal left femur. Electronically Signed   By: Virgina Norfolk M.D.   On: 11/26/2021 21:10   CT Angio Aortobifemoral W and/or Wo Contrast  Result Date: 11/17/2021 CLINICAL DATA:  Severe left leg pain. History of atrial fibrillation. EXAM: CT ANGIOGRAPHY OF ABDOMINAL AORTA WITH ILIOFEMORAL RUNOFF TECHNIQUE: Multidetector CT imaging of the abdomen, pelvis and lower extremities was performed using the standard protocol during bolus administration of intravenous contrast. Multiplanar CT image reconstructions and MIPs were obtained to evaluate the vascular anatomy. RADIATION DOSE REDUCTION: This exam was performed according to the departmental dose-optimization program which includes automated exposure control, adjustment of the mA and/or kV according to patient size and/or use of iterative reconstruction technique. CONTRAST:  112mL OMNIPAQUE IOHEXOL 350 MG/ML SOLN COMPARISON:  CT abdomen pelvis without contrast 10/27/2021 FINDINGS: VASCULAR Aorta: Scattered atheromatous plaque without aneurysm or flow-limiting stenosis. 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 Otherwise patent. SMA: Mild narrowing of the origin, otherwise patent. Renals: Moderate narrowing at the origin of the left and mild narrowing of the proximal right renal arteries. IMA: Patent. RIGHT Lower Extremity Inflow: Common, internal and external iliac arteries are patent without evidence of aneurysm,  dissection, vasculitis or significant stenosis. Outflow: Common, superficial and profunda femoral arteries and the popliteal artery are patent without evidence of aneurysm, dissection, vasculitis or significant stenosis. Runoff: Tibioperoneal trunk is patent. Anterior tibial artery is occluded. Two vessel runoff to the right ankle through the posterior tibial and peroneal arteries. 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 Left common, internal, and external iliac arteries are otherwise patent without significant stenosis. Outflow: Common, superficial and profunda femoral arteries and the popliteal artery are patent without significant stenosis. There is focal aneurysm of the proximal popliteal artery measuring 9 mm, best seen on image 137 of series 10. Runoff: Tibioperoneal trunk is patent. Two vessel runoff to the left ankle through the peroneal and posterior tibial arteries. Anterior tibial artery is occluded. Veins: No obvious venous abnormality within the limitations of this arterial phase study. Review of the MIP images confirms the above findings. NON-VASCULAR Lower chest: Strandy bibasilar lung opacities likely due to atelectasis. Left atrial appendage Watchman device partially visualized. Hepatobiliary: No focal liver abnormality is seen. No gallstones, gallbladder wall thickening, or biliary dilatation. Pancreas: Unremarkable. No pancreatic ductal dilatation or surrounding inflammatory changes. Spleen: Normal in size without focal abnormality. Adrenals/Urinary Tract: Adrenal glands are normal. Lobulated low-density structures in the right renal hilum are likely renal sinus cysts, which does not require further imaging follow-up. Kidneys and ureters otherwise unremarkable. Evaluation of the bladder is limited due to streak artifact from right total hip  prosthesis. Stomach/Bowel: Stomach is within normal  limits. Appendix is not definitively identified. No evidence of bowel wall thickening, distention, or inflammatory changes. Lymphatic: No enlarged abdominal or pelvic lymph nodes. Reproductive: Evaluation the prostate is significantly limited due to streak artifact. Other: Focal fat stranding seen in the left lateral pelvic subcutaneous tissues with a rounded collection measuring 3.6 x 2.8 cm, most likely due to hematoma. There has been interval decrease of subcutaneous emphysema consistent with resolving postsurgical change. Musculoskeletal: Advanced degenerative changes of the lumbar spine are again seen. Right total hip prosthesis is well seated without periprosthetic fracture or lucency. No significant oval change in alignment or healing of left intertrochanteric femur fracture. Left femoral intramedullary nail and proximal and distal interlocking screws are intact without periprosthetic fracture or lucency. 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 IMPRESSION: 1. 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 Otherwise no significant stenosis of lower extremity arteries. 2. 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 3. 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 4. Two vessel runoff to the ankles bilaterally. 5. 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 No acute abnormality of the abdomen or pelvis. 2. Left lateral subcutaneous pelvic hematoma is again seen. Subcutaneous emphysema has resolved. 3. No significant interval change in alignment or healing of left intertrochanteric femur fracture. Electronically Signed   By: Miachel Roux M.D.   On: 11/17/2021 08:56   US Venous Img Lower Unilateral Left  Result Date: 11/17/2021 CLINICAL DATA:  81 year old male with recent left leg surgery. Increasing pain. EXAM: LEFT LOWER  EXTREMITY VENOUS DOPPLER ULTRASOUND TECHNIQUE: Gray-scale sonography with compression, as well as color and duplex ultrasound, were performed to evaluate the deep venous system(s) from the level of the common femoral vein through the popliteal and proximal calf veins. COMPARISON:  Intraoperative left hip radiographs 10/25/2021. FINDINGS: VENOUS Normal compressibility of the common femoral, superficial femoral, and popliteal veins, as well as the visualized calf veins. Visualized portions of profunda femoral vein and great saphenous vein unremarkable. No filling defects to suggest DVT on grayscale or color Doppler imaging. Doppler waveforms show normal direction of venous flow, normal respiratory plasticity and response to augmentation. Limited views of the contralateral common femoral vein are unremarkable. OTHER None. Limitations: none IMPRESSION: No evidence of left lower extremity deep venous thrombosis. Electronically Signed   By: Genevie Ann M.D.   On: 11/17/2021 07:00   DG Chest Port 1 View  Result Date: 11/17/2021 CLINICAL DATA:  81 year old male with possible sepsis. EXAM: PORTABLE CHEST 1 VIEW COMPARISON:  Portable chest 11/10/2021.  Chest CT 10/25/2021. FINDINGS: Portable AP upright view at 0537 hours. Stable borderline to mild cardiomegaly and mediastinal contours. Abundant bowel gas in the visible abdomen including situated between the liver and diaphragm, stable. Mildly improved lung volumes and bilateral ventilation since 11/10/2021. Regressed left peripheral lung and right lung base patchy and indistinct opacity. No pneumothorax, pulmonary edema, pleural effusion, or areas of worsening ventilation. No acute osseous abnormality identified. IMPRESSION: 1. Improved ventilation since 11/10/2021 with decreased patchy lung opacity. 2. No new cardiopulmonary abnormality. 3. Stable visible bowel gas in the upper abdomen. Electronically Signed   By: Genevie Ann M.D.   On: 11/17/2021 05:46     Microbiology: Results for orders placed or performed during the hospital encounter of 11/17/21  Blood culture (routine single)  Status: None   Collection Time: 11/17/21  5:32 AM   Specimen: BLOOD RIGHT HAND  Result Value Ref Range Status   Specimen Description BLOOD RIGHT HAND  Final   Special Requests BOTTLES DRAWN AEROBIC AND ANAEROBIC  Final   Culture   Final    NO GROWTH 5 DAYS Performed at [facility], [address] [place], [place] [address]    Report Status 11/22/2021 FINAL  Final  Urine Culture     Status: Abnormal   Collection Time: 11/17/21  3:30 PM   Specimen: Urine, Clean Catch  Result Value Ref Range Status   Specimen Description   Final    URINE, CLEAN CATCH Performed at [facility], [address] [place], [place] [address]    Special Requests   Final    NONE Performed at [facility], [place], [place] [address]    Culture 20,000 COLONIES/mL PROTEUS MIRABILIS (A)  Final   Report Status 11/19/2021 FINAL  Final   Organism ID, Bacteria PROTEUS MIRABILIS (A)  Final      Susceptibility   Proteus mirabilis - MIC*    AMPICILLIN <=2 SENSITIVE Sensitive     CEFAZOLIN <=4 SENSITIVE Sensitive     CEFEPIME <=0.12 SENSITIVE Sensitive     CEFTRIAXONE <=0.25 SENSITIVE Sensitive     CIPROFLOXACIN 1 RESISTANT Resistant     GENTAMICIN <=1 SENSITIVE Sensitive     IMIPENEM 1 SENSITIVE Sensitive     NITROFURANTOIN 128 RESISTANT Resistant     TRIMETH/SULFA >=320 RESISTANT Resistant     AMPICILLIN/SULBACTAM <=2 SENSITIVE Sensitive     PIP/TAZO <=4 SENSITIVE Sensitive     * 20,000 COLONIES/mL PROTEUS MIRABILIS    Labs: CBC: Recent Labs  Lab 12/12/21 1306 12/13/21 0410  WBC 9.0 7.9  NEUTROABS 5.9  --   HGB 12.9* 12.6*  HCT 38.5* 37.5*  MCV 91.7 90.8  PLT 189 [id number]   Basic Metabolic Panel: Recent Labs  Lab 12/12/21 1306 12/13/21 0410  NA 140 142  K 3.6 3.8  CL 105 107  CO2 28 27  GLUCOSE 127* 114*  BUN 17  14  CREATININE 1.02 0.89  CALCIUM 8.5* 8.5*   Liver Function Tests: Recent Labs  Lab 12/12/21 1306  AST 29  ALT 25  ALKPHOS 155*  BILITOT 1.1  PROT 6.4*  ALBUMIN 3.1*   CBG: No results for input(s): "GLUCAP" in the last 168 hours.  Discharge time spent: less than 30 minutes.  Signed: Annita Brod, MD [facility] 12/16/2021

## 2021-12-18 DIAGNOSIS — R531 Weakness: Secondary | ICD-10-CM | POA: Diagnosis not present

## 2021-12-18 DIAGNOSIS — R52 Pain, unspecified: Secondary | ICD-10-CM | POA: Diagnosis not present

## 2021-12-18 DIAGNOSIS — R630 Anorexia: Secondary | ICD-10-CM | POA: Diagnosis not present

## 2021-12-19 ENCOUNTER — Encounter (INDEPENDENT_AMBULATORY_CARE_PROVIDER_SITE_OTHER): Payer: Self-pay

## 2021-12-22 DIAGNOSIS — Z9181 History of falling: Secondary | ICD-10-CM | POA: Diagnosis not present

## 2021-12-22 DIAGNOSIS — M25562 Pain in left knee: Secondary | ICD-10-CM | POA: Diagnosis not present

## 2021-12-22 DIAGNOSIS — M25552 Pain in left hip: Secondary | ICD-10-CM | POA: Diagnosis not present

## 2021-12-22 DIAGNOSIS — R0602 Shortness of breath: Secondary | ICD-10-CM | POA: Diagnosis not present

## 2021-12-22 DIAGNOSIS — J449 Chronic obstructive pulmonary disease, unspecified: Secondary | ICD-10-CM | POA: Diagnosis not present

## 2021-12-22 DIAGNOSIS — S72142A Displaced intertrochanteric fracture of left femur, initial encounter for closed fracture: Secondary | ICD-10-CM | POA: Diagnosis not present

## 2021-12-28 DIAGNOSIS — R635 Abnormal weight gain: Secondary | ICD-10-CM | POA: Diagnosis not present

## 2021-12-28 DIAGNOSIS — R42 Dizziness and giddiness: Secondary | ICD-10-CM | POA: Diagnosis not present

## 2021-12-28 DIAGNOSIS — R0602 Shortness of breath: Secondary | ICD-10-CM | POA: Diagnosis not present

## 2021-12-28 DIAGNOSIS — D649 Anemia, unspecified: Secondary | ICD-10-CM | POA: Diagnosis not present

## 2022-01-02 DIAGNOSIS — M25552 Pain in left hip: Secondary | ICD-10-CM | POA: Diagnosis not present

## 2022-01-02 DIAGNOSIS — E46 Unspecified protein-calorie malnutrition: Secondary | ICD-10-CM | POA: Diagnosis not present

## 2022-01-02 DIAGNOSIS — J449 Chronic obstructive pulmonary disease, unspecified: Secondary | ICD-10-CM | POA: Diagnosis not present

## 2022-01-02 DIAGNOSIS — S72142A Displaced intertrochanteric fracture of left femur, initial encounter for closed fracture: Secondary | ICD-10-CM | POA: Diagnosis not present

## 2022-01-02 DIAGNOSIS — R1310 Dysphagia, unspecified: Secondary | ICD-10-CM | POA: Diagnosis not present

## 2022-01-02 DIAGNOSIS — I5040 Unspecified combined systolic (congestive) and diastolic (congestive) heart failure: Secondary | ICD-10-CM | POA: Diagnosis not present

## 2022-01-02 DIAGNOSIS — Z9181 History of falling: Secondary | ICD-10-CM | POA: Diagnosis not present

## 2022-01-26 DIAGNOSIS — N401 Enlarged prostate with lower urinary tract symptoms: Secondary | ICD-10-CM | POA: Diagnosis not present

## 2022-01-26 DIAGNOSIS — I130 Hypertensive heart and chronic kidney disease with heart failure and stage 1 through stage 4 chronic kidney disease, or unspecified chronic kidney disease: Secondary | ICD-10-CM | POA: Diagnosis not present

## 2022-01-26 DIAGNOSIS — E7800 Pure hypercholesterolemia, unspecified: Secondary | ICD-10-CM | POA: Diagnosis not present

## 2022-01-26 DIAGNOSIS — E1122 Type 2 diabetes mellitus with diabetic chronic kidney disease: Secondary | ICD-10-CM | POA: Diagnosis not present

## 2022-01-26 DIAGNOSIS — I480 Paroxysmal atrial fibrillation: Secondary | ICD-10-CM | POA: Diagnosis not present

## 2022-01-26 DIAGNOSIS — Z89422 Acquired absence of other left toe(s): Secondary | ICD-10-CM | POA: Diagnosis not present

## 2022-01-26 DIAGNOSIS — I5042 Chronic combined systolic (congestive) and diastolic (congestive) heart failure: Secondary | ICD-10-CM | POA: Diagnosis not present

## 2022-01-26 DIAGNOSIS — K219 Gastro-esophageal reflux disease without esophagitis: Secondary | ICD-10-CM | POA: Diagnosis not present

## 2022-01-26 DIAGNOSIS — D696 Thrombocytopenia, unspecified: Secondary | ICD-10-CM | POA: Diagnosis not present

## 2022-03-10 DIAGNOSIS — J690 Pneumonitis due to inhalation of food and vomit: Secondary | ICD-10-CM

## 2022-09-06 ENCOUNTER — Emergency Department: Payer: Medicare Other

## 2022-09-06 ENCOUNTER — Other Ambulatory Visit: Payer: Self-pay

## 2022-09-06 ENCOUNTER — Emergency Department
Admission: EM | Admit: 2022-09-06 | Discharge: 2022-09-06 | Disposition: A | Discharge status: Home / Self Care | Payer: Medicare Other | Attending: Emergency Medicine | Admitting: Emergency Medicine

## 2022-09-06 DIAGNOSIS — R531 Weakness: Secondary | ICD-10-CM | POA: Diagnosis not present

## 2022-09-06 DIAGNOSIS — R202 Paresthesia of skin: Secondary | ICD-10-CM | POA: Insufficient documentation

## 2022-09-06 DIAGNOSIS — R279 Unspecified lack of coordination: Secondary | ICD-10-CM | POA: Diagnosis not present

## 2022-09-06 DIAGNOSIS — Z8679 Personal history of other diseases of the circulatory system: Secondary | ICD-10-CM | POA: Diagnosis not present

## 2022-09-06 DIAGNOSIS — M542 Cervicalgia: Secondary | ICD-10-CM | POA: Diagnosis not present

## 2022-09-06 DIAGNOSIS — R519 Headache, unspecified: Secondary | ICD-10-CM | POA: Diagnosis not present

## 2022-09-06 DIAGNOSIS — Z743 Need for continuous supervision: Secondary | ICD-10-CM | POA: Diagnosis not present

## 2022-09-06 DIAGNOSIS — R0902 Hypoxemia: Secondary | ICD-10-CM | POA: Diagnosis not present

## 2022-09-06 DIAGNOSIS — R5383 Other fatigue: Secondary | ICD-10-CM | POA: Diagnosis not present

## 2022-09-06 LAB — CBC WITH DIFFERENTIAL/PLATELET
Abs Immature Granulocytes: 0.02 10*3/uL (ref 0.00–0.07)
Basophils Absolute: 0.1 10*3/uL (ref 0.0–0.1)
Basophils Relative: 1 %
Eosinophils Absolute: 0.3 10*3/uL (ref 0.0–0.5)
Eosinophils Relative: 5 %
HCT: 33.2 % — ABNORMAL LOW (ref 39.0–52.0)
Hemoglobin: 10.7 g/dL — ABNORMAL LOW (ref 13.0–17.0)
Immature Granulocytes: 0 %
Lymphocytes Relative: 31 %
Lymphs Abs: 1.7 10*3/uL (ref 0.7–4.0)
MCH: 31.2 pg (ref 26.0–34.0)
MCHC: 32.2 g/dL (ref 30.0–36.0)
MCV: 96.8 fL (ref 80.0–100.0)
Monocytes Absolute: 0.4 10*3/uL (ref 0.1–1.0)
Monocytes Relative: 8 %
Neutro Abs: 3.1 10*3/uL (ref 1.7–7.7)
Neutrophils Relative %: 55 %
Platelets: 194 10*3/uL (ref 150–400)
RBC: 3.43 MIL/uL — ABNORMAL LOW (ref 4.22–5.81)
RDW: 15.3 % (ref 11.5–15.5)
WBC: 5.6 10*3/uL (ref 4.0–10.5)
nRBC: 0 % (ref 0.0–0.2)

## 2022-09-06 LAB — TROPONIN I (HIGH SENSITIVITY)
Troponin I (High Sensitivity): 8 ng/L (ref <18)
Troponin I (High Sensitivity): 8 ng/L (ref <18)

## 2022-09-06 LAB — BASIC METABOLIC PANEL
Anion gap: 6 (ref 5–15)
BUN: 22 mg/dL (ref 8–23)
CO2: 27 mmol/L (ref 22–32)
Calcium: 8.7 mg/dL — ABNORMAL LOW (ref 8.9–10.3)
Chloride: 106 mmol/L (ref 98–111)
Creatinine, Ser: 1.12 mg/dL (ref 0.61–1.24)
GFR, Estimated: >60 mL/min (ref >60)
Glucose, Bld: 114 mg/dL — ABNORMAL HIGH (ref 70–99)
Potassium: 3.9 mmol/L (ref 3.5–5.1)
Sodium: 139 mmol/L (ref 135–145)

## 2022-09-06 LAB — URINALYSIS, ROUTINE W REFLEX MICROSCOPIC
Bilirubin Urine: NEGATIVE
Glucose, UA: NEGATIVE mg/dL
Hgb urine dipstick: NEGATIVE
Ketones, ur: NEGATIVE mg/dL
Leukocytes,Ua: NEGATIVE
Nitrite: NEGATIVE
Protein, ur: NEGATIVE mg/dL
Specific Gravity, Urine: 1.02 (ref 1.005–1.030)
pH: 6 (ref 5.0–8.0)

## 2022-09-06 MED ORDER — SODIUM CHLORIDE 0.9 % IV BOLUS
1000.0000 mL | Freq: Once | INTRAVENOUS | Status: AC
  Administered 2022-09-06: 1000 mL via INTRAVENOUS

## 2022-09-06 NOTE — ED Notes (Signed)
Pt able to stand and pivot with assistance from chair to bed.

## 2022-09-06 NOTE — ED Provider Notes (Signed)
[facility] Provider Note   Event Date/Time   First MD Initiated Contact with Patient 09/06/22 1046     (approximate) History  Weakness (Patient brought in by EMS from [facility] with complaints of fatigue, weakness, and bilateral hand tingling that began last night; He states that these symptoms came on quickly; GCS of 15, is slow to answer questions and is very soft spoken; Is able to move all extremities (limited movement of LEFT leg d/t hip surgery earlier this year))  HPI Jeremy Herrera is a 82 y.o. male who presents from [facility] via EMS with complaints of fatigue, weakness, bilateral hand tingling for the last 24 hours.  Patient states that the symptoms came on quickly however he is very slow to answer questions and is soft-spoken.  Patient's daughter at bedside and states that she was only told that he woke up this morning with bilateral upper extremity paresthesias, occipital headache, and neck pain.  Patient endorses the symptoms and timeline. ROS: Patient currently denies any vision changes, tinnitus, difficulty speaking, facial droop, sore throat, chest pain, shortness of breath, abdominal pain, nausea/vomiting/diarrhea, dysuria, or weakness in any extremity   Physical Exam  Triage Vital Signs: ED Triage Vitals  Encounter Vitals Group     BP 09/06/22 0846 (!) 136/96     Systolic BP Percentile --      Diastolic BP Percentile --      Pulse Rate 09/06/22 0846 (!) 101     Resp 09/06/22 0846 16     Temp 09/06/22 0846 97.8 F (36.6 C)     Temp Source 09/06/22 0846 Oral     SpO2 09/06/22 0846 100 %     Weight 09/06/22 0851 183 lb 12.8 oz (83.4 kg)     Height 09/06/22 0851 6\' 2"  (1.88 m)     Head Circumference --      [facility] --      Pain Score 09/06/22 0851 4     Pain Loc --      Pain Education --      Exclude from Growth Chart --    Most recent vital signs: Vitals:   09/06/22 1330 09/06/22 1736  BP: 114/68 112/76  Pulse:  89  Resp:  15 16  Temp:    SpO2:  99%   General: Awake, oriented x4. CV:  Good peripheral perfusion.  Resp:  Normal effort.  Abd:  No distention.  Other:  Elderly well-developed, well-nourished Caucasian male laying in bed in no acute distress.  Decree sensation to bilateral hands with normal sensation at the forearms and upper arms ED Results / Procedures / Treatments  Labs (all labs ordered are listed, but only abnormal results are displayed) Labs Reviewed  CBC WITH DIFFERENTIAL/PLATELET - Abnormal; Notable for the following components:      Result Value   RBC 3.43 (*)    Hemoglobin 10.7 (*)    HCT 33.2 (*)    All other components within normal limits  BASIC METABOLIC PANEL - Abnormal; Notable for the following components:   Glucose, Bld 114 (*)    Calcium 8.7 (*)    All other components within normal limits  URINALYSIS, ROUTINE W REFLEX MICROSCOPIC - Abnormal; Notable for the following components:   Color, Urine YELLOW (*)    APPearance CLEAR (*)    All other components within normal limits  TROPONIN I (HIGH SENSITIVITY)  TROPONIN I (HIGH SENSITIVITY)   EKG ED ECG REPORT I, Clent Jacks  Electa Sterry, the attending physician, personally viewed and interpreted this ECG. Date: 09/06/2022 EKG Time: 0843 Rate: 91 Rhythm: Atrial fibrillation QRS Axis: normal Intervals: normal ST/T Wave abnormalities: normal Narrative Interpretation: Atrial fibrillation.  No evidence of acute ischemia RADIOLOGY ED MD interpretation: Single view portable chest x-ray interpreted independently by me shows hazy opacity at the left lung base likely representing atelectasis or infection, borderline cardiomegaly with prominent bilateral interstitial opacities are present pulmonary congestion, and colonic gaseous distention -Agree with radiology assessment Official radiology report(s): No results found. PROCEDURES: Critical Care performed: No Procedures MEDICATIONS ORDERED IN ED: Medications  sodium chloride 0.9 %  bolus 1,000 mL (0 mLs Intravenous Stopped 09/06/22 1415)   IMPRESSION / MDM / ASSESSMENT AND PLAN / ED COURSE  I reviewed the triage vital signs and the nursing notes.                             The patient is on the cardiac monitor to evaluate for evidence of arrhythmia and/or significant heart rate changes. Patient's presentation is most consistent with acute presentation with potential threat to life or bodily function. + neck pain + sensation of weakness and numbness.]  ED Workup: C-Spine imaging negative   Given History, Exam the patient appears to have a cervical radiculopathy.   Patient appears to be low risk for complications or other emergent conditions such as  anginal equivalent, frank cervical instability, arterial dissection, osteomyelitis, epidural abscess, central cord syndrome, c-spine fracture, other spinal emergencies  Rx: NSAIDs, outpatient physical therapy evaluation and recommendation for home exercises in the interim Disposition: Discharge. The patient has been given strict return precautions and understands the need to follow up within 48 hours with their primary care provider   FINAL CLINICAL IMPRESSION(S) / ED DIAGNOSES   Final diagnoses:  Paresthesia of upper extremity  Neck pain  Generalized weakness  History of atrial fibrillation   Rx / DC Orders   ED Discharge Orders     None      Note:  This document was prepared using Dragon voice recognition software and may include unintentional dictation errors.   Merwyn Katos, MD 09/07/22 1556

## 2022-09-06 NOTE — ED Triage Notes (Signed)
Patient brought in by EMS from [facility] with complaints of fatigue, weakness, and bilateral hand tingling that began last night; He states that these symptoms came on quickly; GCS of 15, is slow to answer questions and is very soft spoken; Is able to move all extremities (limited movement of LEFT leg d/t hip surgery earlier this year)

## 2022-10-04 ENCOUNTER — Encounter: Payer: Self-pay | Admitting: Hematology and Oncology

## 2023-03-24 DEATH — deceased
